# Patient Record
Sex: Female | Born: 1985
Health system: Southern US, Community
[De-identification: ages and names within clinical notes are randomized; demographics above are authoritative.]

## PROBLEM LIST (undated history)

## (undated) DIAGNOSIS — L0390 Cellulitis, unspecified: Secondary | ICD-10-CM

## (undated) DIAGNOSIS — I10 Essential (primary) hypertension: Secondary | ICD-10-CM

## (undated) DIAGNOSIS — E669 Obesity, unspecified: Secondary | ICD-10-CM

## (undated) DIAGNOSIS — E1169 Type 2 diabetes mellitus with other specified complication: Secondary | ICD-10-CM

## (undated) DIAGNOSIS — R17 Unspecified jaundice: Secondary | ICD-10-CM

## (undated) DIAGNOSIS — E785 Hyperlipidemia, unspecified: Secondary | ICD-10-CM

## (undated) HISTORY — PX: OTHER SURGICAL HISTORY: SHX169

## (undated) HISTORY — DX: Type 2 diabetes mellitus with other specified complication: E11.69

## (undated) HISTORY — DX: Cellulitis, unspecified: L03.90

## (undated) HISTORY — PX: CHOLECYSTECTOMY: SHX55

## (undated) HISTORY — DX: Type 2 diabetes mellitus with other specified complication: E78.5

---

## 2012-01-18 ENCOUNTER — Encounter (HOSPITAL_BASED_OUTPATIENT_CLINIC_OR_DEPARTMENT_OTHER): Payer: Self-pay | Admitting: Emergency Medicine

## 2012-01-18 ENCOUNTER — Emergency Department (HOSPITAL_BASED_OUTPATIENT_CLINIC_OR_DEPARTMENT_OTHER)
Admission: EM | Admit: 2012-01-18 | Discharge: 2012-01-19 | Disposition: A | Discharge status: Home / Self Care | Payer: Medicaid Other | Attending: Emergency Medicine | Admitting: Emergency Medicine

## 2012-01-18 DIAGNOSIS — O24919 Unspecified diabetes mellitus in pregnancy, unspecified trimester: Secondary | ICD-10-CM | POA: Insufficient documentation

## 2012-01-18 DIAGNOSIS — O200 Threatened abortion: Secondary | ICD-10-CM | POA: Insufficient documentation

## 2012-01-18 DIAGNOSIS — F17200 Nicotine dependence, unspecified, uncomplicated: Secondary | ICD-10-CM | POA: Insufficient documentation

## 2012-01-18 DIAGNOSIS — E119 Type 2 diabetes mellitus without complications: Secondary | ICD-10-CM | POA: Insufficient documentation

## 2012-01-18 HISTORY — DX: Obesity, unspecified: E66.9

## 2012-01-18 LAB — GLUCOSE, CAPILLARY: Glucose-Capillary: 148 mg/dL — ABNORMAL HIGH (ref 70–99)

## 2012-01-18 NOTE — ED Notes (Signed)
Pt is [redacted] weeks pregnant and started having mild vaginal bleeding tonight around 2130

## 2012-01-19 ENCOUNTER — Other Ambulatory Visit (HOSPITAL_BASED_OUTPATIENT_CLINIC_OR_DEPARTMENT_OTHER): Payer: Self-pay | Admitting: Emergency Medicine

## 2012-01-19 ENCOUNTER — Ambulatory Visit (INDEPENDENT_AMBULATORY_CARE_PROVIDER_SITE_OTHER)
Admission: RE | Admit: 2012-01-19 | Discharge: 2012-01-19 | Disposition: A | Discharge status: Home / Self Care | Payer: Medicaid Other | Source: Ambulatory Visit | Attending: Emergency Medicine | Admitting: Emergency Medicine

## 2012-01-19 ENCOUNTER — Ambulatory Visit (INDEPENDENT_AMBULATORY_CARE_PROVIDER_SITE_OTHER)
Admit: 2012-01-19 | Discharge: 2012-01-19 | Disposition: A | Discharge status: Home / Self Care | Payer: Medicaid Other | Source: Home / Self Care | Attending: Emergency Medicine | Admitting: Emergency Medicine

## 2012-01-19 DIAGNOSIS — Z3490 Encounter for supervision of normal pregnancy, unspecified, unspecified trimester: Secondary | ICD-10-CM

## 2012-01-19 DIAGNOSIS — Z331 Pregnant state, incidental: Secondary | ICD-10-CM

## 2012-01-19 DIAGNOSIS — O209 Hemorrhage in early pregnancy, unspecified: Secondary | ICD-10-CM | POA: Insufficient documentation

## 2012-01-19 DIAGNOSIS — N898 Other specified noninflammatory disorders of vagina: Secondary | ICD-10-CM

## 2012-01-19 LAB — URINALYSIS, ROUTINE W REFLEX MICROSCOPIC
Glucose, UA: NEGATIVE mg/dL
Protein, ur: NEGATIVE mg/dL
Specific Gravity, Urine: 1.023 (ref 1.005–1.030)

## 2012-01-19 LAB — URINE MICROSCOPIC-ADD ON

## 2012-01-19 LAB — WET PREP, GENITAL: Yeast Wet Prep HPF POC: NONE SEEN

## 2012-01-19 LAB — PREGNANCY, URINE: Preg Test, Ur: POSITIVE — AB

## 2012-01-19 LAB — HCG, QUANTITATIVE, PREGNANCY: hCG, Beta Chain, Quant, S: 25200 m[IU]/mL — ABNORMAL HIGH (ref <5)

## 2012-01-19 LAB — ABO/RH: ABO/RH(D): B POS

## 2012-01-19 NOTE — Discharge Instructions (Signed)
Vaginal Bleeding During Pregnancy, First Trimester  A small amount of bleeding (spotting) is relatively common in early pregnancy. It usually stops on its own. There are many causes for bleeding or spotting in early pregnancy. Some bleeding may be related to the pregnancy and some may not. Cramping with the bleeding is more serious and concerning. Tell your caregiver if you have any vaginal bleeding.   CAUSES    It is normal in most cases.   The pregnancy ends (miscarriage).   The pregnancy may end (threatened miscarriage).   Infection or inflammation of the cervix.   Growths (polyps) on the cervix.   Pregnancy happens outside of the uterus and in a fallopian tube (tubal pregnancy).   Many tiny cysts in the uterus instead of pregnancy tissue (molar pregnancy).  SYMPTOMS   Vaginal bleeding or spotting with or without cramps.  DIAGNOSIS   To evaluate the pregnancy, your caregiver may:   Do a pelvic exam.   Take blood tests.   Do an ultrasound.  It is very important to follow your caregiver's instructions.   TREATMENT    Evaluation of the pregnancy with blood tests and ultrasound.   Bed rest (getting up to use the bathroom only).   Rho-gam immunization if the mother is Rh negative and the father is Rh positive.  HOME CARE INSTRUCTIONS    If your caregiver orders bed rest, you may need to make arrangements for the care of other children and for other responsibilities. However, your caregiver may allow you to continue light activity.   Keep track of the number of pads you use each day, how often you change pads and how soaked (saturated) they are. Write this down.   Do not use tampons. Do not douche.   Do not have sexual intercourse or orgasms until approved by your physician.   Save any tissue that you pass for your caregiver to see.   Take medicine for cramps only with your caregiver's permission.   Do not take aspirin because it can make you bleed.  SEEK IMMEDIATE MEDICAL CARE IF:    You  experience severe cramps in your stomach, back or belly (abdomen).   You have an oral temperature above 102 F (38.9 C), not controlled by medicine.   You pass large clots or tissue.   Your bleeding increases or you become light-headed, weak or have fainting episodes.   You develop chills.   You are leaking or have a gush of fluid from your vagina.   You pass out while having a bowel movement. That may mean you have a ruptured tubal pregnancy.  Document Released: 06/14/2005 Document Revised: 08/24/2011 Document Reviewed: 12/24/2008  [employer] Patient Information 2012 [employer]

## 2012-01-19 NOTE — ED Provider Notes (Signed)
History     CSN: [id number]  Arrival date & time 01/18/12  2157   First MD Initiated Contact with Patient 01/19/12 0013      Chief Complaint  Patient presents with  . Vaginal Bleeding    (Consider location/radiation/quality/duration/timing/severity/associated sxs/prior treatment) HPI Is a 26 year old black female who is about [redacted] weeks pregnant with her first pregnancy. She started having mild vaginal bleeding about 9:30 PM yesterday. She has had cramping since she became pregnant but this has not acutely changed today. She characterizes the bleeding as like spotting.  Past Medical History  Diagnosis Date  . Diabetes mellitus   . Obesity     Past Surgical History  Procedure Date  . Cholecystectomy     No family history on file.  History  Substance Use Topics  . Smoking status: Current Some Day Smoker  . Smokeless tobacco: Not on file  . Alcohol Use: No    OB History    Grav Para Term Preterm Abortions TAB SAB Ect Mult Living                  Review of Systems  All other systems reviewed and are negative.    Allergies  Review of patient's allergies indicates no known allergies.  Home Medications   Current Outpatient Rx  Name Route Sig Dispense Refill  . METFORMIN HCL 500 MG PO TABS Oral Take 500 mg by mouth 2 (two) times daily with a meal.    . PRENATAL MULTIVITAMIN CH Oral Take 1 tablet by mouth daily.      BP 157/71  Pulse 83  Temp(Src) 98.5 F (36.9 C) (Oral)  Resp 18  Ht 5\' 8"  (1.727 m)  Wt 227 lb (102.967 kg)  BMI 34.52 kg/m2  SpO2 100%  LMP 10/21/2011  Physical Exam General: Well-developed, well-nourished female in no acute distress; appearance consistent with age of record HENT: normocephalic, atraumatic Eyes: pupils equal round and reactive to light; extraocular muscles intact Neck: supple Heart: regular rate and rhythm Lungs: clear to auscultation bilaterally Abdomen: soft; nondistended; nontender GU: Normal external genitalia; no  vaginal bleeding; physiologic appearing vaginal discharge; no cervical motion tenderness Extremities: No deformity; full range of motion Neurologic: Awake, alert and oriented; motor function intact in all extremities and symmetric; no facial droop Skin: Warm and dry Psychiatric: Normal mood and affect    ED Course  Procedures (including critical care time)     MDM   Nursing notes and vitals signs, including pulse oximetry, reviewed.  Summary of this visit's results, reviewed by myself:  Labs:  Results for orders placed during the hospital encounter of 01/18/12  GLUCOSE, CAPILLARY      Component Value Range   Glucose-Capillary 148 (*) 70 - 99 (mg/dL)   Comment 1 Notify RN     Comment 2 Documented in Chart    URINALYSIS, ROUTINE W REFLEX MICROSCOPIC      Component Value Range   Color, Urine YELLOW  YELLOW    APPearance CLOUDY (*) CLEAR    Specific Gravity, Urine 1.023  1.005 - 1.030    pH 6.0  5.0 - 8.0    Glucose, UA NEGATIVE  NEGATIVE (mg/dL)   Hgb urine dipstick LARGE (*) NEGATIVE    Bilirubin Urine NEGATIVE  NEGATIVE    Ketones, ur NEGATIVE  NEGATIVE (mg/dL)   Protein, ur NEGATIVE  NEGATIVE (mg/dL)   Urobilinogen, UA 1.0  0.0 - 1.0 (mg/dL)   Nitrite NEGATIVE  NEGATIVE    Leukocytes, UA  SMALL (*) NEGATIVE   PREGNANCY, URINE      Component Value Range   Preg Test, Ur POSITIVE (*) NEGATIVE   WET PREP, GENITAL      Component Value Range   Yeast Wet Prep HPF POC NONE SEEN  NONE SEEN    Trich, Wet Prep NONE SEEN  NONE SEEN    Clue Cells Wet Prep HPF POC FEW (*) NONE SEEN    WBC, Wet Prep HPF POC FEW (*) NONE SEEN   HCG, QUANTITATIVE, PREGNANCY      Component Value Range   hCG, Beta Chain, Quant, S 25200 (*) <5 (mIU/mL)  URINE MICROSCOPIC-ADD ON      Component Value Range   Squamous Epithelial / LPF FEW (*) RARE    WBC, UA 3-6  <3 (WBC/hpf)   RBC / HPF 3-6  <3 (RBC/hpf)   Bacteria, UA FEW (*) RARE    Urine-Other MUCOUS PRESENT              Carlisle Beers  Khadejah Son, MD 01/19/12 0120

## 2016-01-01 ENCOUNTER — Emergency Department (HOSPITAL_BASED_OUTPATIENT_CLINIC_OR_DEPARTMENT_OTHER)
Admission: EM | Admit: 2016-01-01 | Discharge: 2016-01-01 | Disposition: A | Discharge status: Home / Self Care | Payer: Self-pay | Attending: Emergency Medicine | Admitting: Emergency Medicine

## 2016-01-01 ENCOUNTER — Emergency Department (HOSPITAL_BASED_OUTPATIENT_CLINIC_OR_DEPARTMENT_OTHER): Payer: Self-pay

## 2016-01-01 ENCOUNTER — Encounter (HOSPITAL_BASED_OUTPATIENT_CLINIC_OR_DEPARTMENT_OTHER): Payer: Self-pay | Admitting: *Deleted

## 2016-01-01 DIAGNOSIS — Z9049 Acquired absence of other specified parts of digestive tract: Secondary | ICD-10-CM | POA: Insufficient documentation

## 2016-01-01 DIAGNOSIS — K5900 Constipation, unspecified: Secondary | ICD-10-CM | POA: Insufficient documentation

## 2016-01-01 DIAGNOSIS — Z7984 Long term (current) use of oral hypoglycemic drugs: Secondary | ICD-10-CM | POA: Insufficient documentation

## 2016-01-01 DIAGNOSIS — R52 Pain, unspecified: Secondary | ICD-10-CM

## 2016-01-01 DIAGNOSIS — Z9114 Patient's other noncompliance with medication regimen: Secondary | ICD-10-CM

## 2016-01-01 DIAGNOSIS — E669 Obesity, unspecified: Secondary | ICD-10-CM | POA: Insufficient documentation

## 2016-01-01 DIAGNOSIS — K2970 Gastritis, unspecified, without bleeding: Secondary | ICD-10-CM | POA: Insufficient documentation

## 2016-01-01 DIAGNOSIS — I10 Essential (primary) hypertension: Secondary | ICD-10-CM | POA: Insufficient documentation

## 2016-01-01 DIAGNOSIS — E119 Type 2 diabetes mellitus without complications: Secondary | ICD-10-CM | POA: Insufficient documentation

## 2016-01-01 DIAGNOSIS — F17200 Nicotine dependence, unspecified, uncomplicated: Secondary | ICD-10-CM | POA: Insufficient documentation

## 2016-01-01 HISTORY — DX: Essential (primary) hypertension: I10

## 2016-01-01 LAB — HEPATIC FUNCTION PANEL
ALT: 16 U/L (ref 14–54)
AST: 17 U/L (ref 15–41)
Albumin: 3.9 g/dL (ref 3.5–5.0)
Alkaline Phosphatase: 107 U/L (ref 38–126)
BILIRUBIN DIRECT: 0.1 mg/dL (ref 0.1–0.5)
Indirect Bilirubin: 0.4 mg/dL (ref 0.3–0.9)
TOTAL PROTEIN: 8 g/dL (ref 6.5–8.1)
Total Bilirubin: 0.5 mg/dL (ref 0.3–1.2)

## 2016-01-01 LAB — CBC WITH DIFFERENTIAL/PLATELET
BASOS ABS: 0 10*3/uL (ref 0.0–0.1)
BASOS PCT: 0 %
EOS ABS: 0.2 10*3/uL (ref 0.0–0.7)
EOS PCT: 2 %
HCT: 40.9 % (ref 36.0–46.0)
Hemoglobin: 13.5 g/dL (ref 12.0–15.0)
Lymphocytes Relative: 51 %
Lymphs Abs: 5.1 10*3/uL — ABNORMAL HIGH (ref 0.7–4.0)
MCH: 26 pg (ref 26.0–34.0)
MCHC: 33 g/dL (ref 30.0–36.0)
MCV: 78.7 fL (ref 78.0–100.0)
MONO ABS: 0.7 10*3/uL (ref 0.1–1.0)
Monocytes Relative: 7 %
Neutro Abs: 4 10*3/uL (ref 1.7–7.7)
Neutrophils Relative %: 40 %
PLATELETS: 331 10*3/uL (ref 150–400)
RBC: 5.2 MIL/uL — AB (ref 3.87–5.11)
RDW: 14.2 % (ref 11.5–15.5)
WBC: 10 10*3/uL (ref 4.0–10.5)

## 2016-01-01 LAB — RAPID URINE DRUG SCREEN, HOSP PERFORMED
Amphetamines: NOT DETECTED
BARBITURATES: NOT DETECTED
Benzodiazepines: NOT DETECTED
Cocaine: NOT DETECTED
Opiates: NOT DETECTED
TETRAHYDROCANNABINOL: NOT DETECTED

## 2016-01-01 LAB — BASIC METABOLIC PANEL
ANION GAP: 7 (ref 5–15)
BUN: 9 mg/dL (ref 6–20)
CALCIUM: 9.1 mg/dL (ref 8.9–10.3)
CO2: 25 mmol/L (ref 22–32)
Chloride: 101 mmol/L (ref 101–111)
Creatinine, Ser: 0.6 mg/dL (ref 0.44–1.00)
GLUCOSE: 320 mg/dL — AB (ref 65–99)
POTASSIUM: 3.7 mmol/L (ref 3.5–5.1)
SODIUM: 133 mmol/L — AB (ref 135–145)

## 2016-01-01 LAB — URINALYSIS, ROUTINE W REFLEX MICROSCOPIC
BILIRUBIN URINE: NEGATIVE
Glucose, UA: >1000 mg/dL — AB
Hgb urine dipstick: NEGATIVE
KETONES UR: NEGATIVE mg/dL
LEUKOCYTES UA: NEGATIVE
NITRITE: NEGATIVE
PH: 7.5 (ref 5.0–8.0)
PROTEIN: 100 mg/dL — AB
Specific Gravity, Urine: 1.03 (ref 1.005–1.030)

## 2016-01-01 LAB — ACETAMINOPHEN LEVEL

## 2016-01-01 LAB — URINE MICROSCOPIC-ADD ON

## 2016-01-01 LAB — PREGNANCY, URINE: PREG TEST UR: NEGATIVE

## 2016-01-01 LAB — SALICYLATE LEVEL: Salicylate Lvl: <4 mg/dL (ref 2.8–30.0)

## 2016-01-01 MED ORDER — OMEPRAZOLE 20 MG PO CPDR: 20.0000 mg | DELAYED_RELEASE_CAPSULE | Freq: Every day | ORAL | Status: DC

## 2016-01-01 MED ORDER — ACETAMINOPHEN 325 MG PO TABS: 650.0000 mg | ORAL_TABLET | Freq: Once | ORAL | Status: DC

## 2016-01-01 MED ORDER — ONDANSETRON HCL 4 MG/2ML IJ SOLN
4.0000 mg | Freq: Once | INTRAMUSCULAR | Status: AC
  Administered 2016-01-01: 4 mg via INTRAVENOUS
  Filled 2016-01-01: qty 2

## 2016-01-01 MED ORDER — GI COCKTAIL ~~LOC~~
30.0000 mL | Freq: Once | ORAL | Status: AC
  Administered 2016-01-01: 30 mL via ORAL
  Filled 2016-01-01: qty 30

## 2016-01-01 MED ORDER — KETOROLAC TROMETHAMINE 30 MG/ML IJ SOLN
30.0000 mg | Freq: Once | INTRAMUSCULAR | Status: AC
  Administered 2016-01-01: 30 mg via INTRAVENOUS
  Filled 2016-01-01: qty 1

## 2016-01-01 MED ORDER — SODIUM CHLORIDE 0.9 % IV BOLUS (SEPSIS)
500.0000 mL | Freq: Once | INTRAVENOUS | Status: AC
  Administered 2016-01-01: 500 mL via INTRAVENOUS

## 2016-01-01 MED ORDER — DICYCLOMINE HCL 10 MG/ML IM SOLN
20.0000 mg | Freq: Once | INTRAMUSCULAR | Status: AC
  Administered 2016-01-01: 20 mg via INTRAMUSCULAR
  Filled 2016-01-01: qty 2

## 2016-01-01 MED ORDER — HYDROCHLOROTHIAZIDE 25 MG PO TABS: 25.0000 mg | ORAL_TABLET | Freq: Every day | ORAL | Status: DC

## 2016-01-01 MED ORDER — HYDROCHLOROTHIAZIDE 25 MG PO TABS
25.0000 mg | ORAL_TABLET | Freq: Every day | ORAL | Status: DC
  Administered 2016-01-01: 25 mg via ORAL
  Filled 2016-01-01: qty 1

## 2016-01-01 MED ORDER — METFORMIN HCL 500 MG PO TABS: 500.0000 mg | ORAL_TABLET | Freq: Two times a day (BID) | ORAL | Status: DC

## 2016-01-01 NOTE — ED Notes (Signed)
C/o sudden onset abd pain w vomited x 1 and nausea

## 2016-01-01 NOTE — Discharge Instructions (Signed)

## 2016-01-01 NOTE — ED Notes (Signed)
C/o sudden set mid to rt sided lower abd pain nausea w vomited x 1

## 2016-01-01 NOTE — ED Provider Notes (Signed)
CSN: [id number]     Arrival date & time 01/01/16  0214 History   First MD Initiated Contact with Patient 01/01/16 0224     Chief Complaint  Patient presents with  . Abdominal Pain     (Consider location/radiation/quality/duration/timing/severity/associated sxs/prior Treatment) Patient is a 30 y.o. female presenting with abdominal pain. The history is provided by the patient.  Abdominal Pain Pain location:  Epigastric Pain quality: cramping   Pain radiates to:  Does not radiate Pain severity:  Moderate Onset quality:  Sudden Timing:  Constant Progression:  Unchanged Chronicity:  New Context: not trauma   Context comment:  Diarrhea then cramping then nausea and emesis Relieved by:  Nothing Worsened by:  Nothing tried Ineffective treatments:  None tried Associated symptoms: vomiting   Associated symptoms: no anorexia, no dysuria, no fever, no shortness of breath, no vaginal bleeding and no vaginal discharge   Risk factors: not pregnant     Past Medical History  Diagnosis Date  . Diabetes mellitus   . Obesity   . Hypertension    Past Surgical History  Procedure Laterality Date  . Cholecystectomy     History reviewed. No pertinent family history. Social History  Substance Use Topics  . Smoking status: Current Some Day Smoker  . Smokeless tobacco: None  . Alcohol Use: No   OB History    No data available     Review of Systems  Constitutional: Negative for fever.  Respiratory: Negative for shortness of breath.   Gastrointestinal: Positive for vomiting and abdominal pain. Negative for anorexia.  Genitourinary: Negative for dysuria, vaginal bleeding and vaginal discharge.  All other systems reviewed and are negative.     Allergies  Review of patient's allergies indicates no known allergies.  Home Medications   Prior to Admission medications   Medication Sig Start Date End Date Taking? Authorizing Provider  metFORMIN (GLUCOPHAGE) 500 MG tablet Take 500 mg by  mouth 2 (two) times daily with a meal.    Historical Provider, MD  Prenatal Vit-Fe Fumarate-FA (PRENATAL MULTIVITAMIN) TABS Take 1 tablet by mouth daily.    Historical Provider, MD   BP 192/113 mmHg  Pulse 90  Temp(Src) 98.8 F (37.1 C) (Oral)  Resp 20  Ht  (1.753 m)  Wt 302 lb (136.986 kg)  BMI 44.58 kg/m2  SpO2 100% Physical Exam  Constitutional: She is oriented to person, place, and time. She appears well-developed and well-nourished. No distress.  HENT:  Head: Normocephalic and atraumatic.  Mouth/Throat: Oropharynx is clear and moist.  Eyes: Conjunctivae are normal. Pupils are equal, round, and reactive to light.  Neck: Normal range of motion. Neck supple.  Cardiovascular: Normal rate, regular rhythm and intact distal pulses.   Pulmonary/Chest: Effort normal and breath sounds normal. No respiratory distress. She has no wheezes. She has no rales.  Abdominal: Soft. She exhibits no mass. Bowel sounds are increased. There is no tenderness. There is no rigidity, no rebound, no guarding, no tenderness at McBurney's point and negative Murphy's sign.  Musculoskeletal: Normal range of motion.  Neurological: She is alert and oriented to person, place, and time.  Skin: Skin is warm and dry.  Psychiatric: She has a normal mood and affect.    ED Course  Procedures (including critical care time) Labs Review Labs Reviewed  CBC WITH DIFFERENTIAL/PLATELET - Abnormal; Notable for the following:    RBC 5.20 (*)    Lymphs Abs 5.1 (*)    All other components within normal limits  BASIC  METABOLIC PANEL - Abnormal; Notable for the following:    Sodium 133 (*)    Glucose, Bld 320 (*)    All other components within normal limits  URINALYSIS, ROUTINE W REFLEX MICROSCOPIC (NOT [facility]) - Abnormal; Notable for the following:    APPearance CLOUDY (*)    Glucose, UA >1000 (*)    Protein, ur 100 (*)    All other components within normal limits  ACETAMINOPHEN LEVEL - Abnormal; Notable for the  following:    Acetaminophen (Tylenol), Serum <10 (*)    All other components within normal limits  URINE MICROSCOPIC-ADD ON - Abnormal; Notable for the following:    Squamous Epithelial / LPF 6-30 (*)    Bacteria, UA FEW (*)    All other components within normal limits  PREGNANCY, URINE  SALICYLATE LEVEL  URINE RAPID DRUG SCREEN, HOSP PERFORMED  HEPATIC FUNCTION PANEL    Imaging Review Ct Renal Stone Study  01/01/2016  CLINICAL DATA:  Sudden onset mid to right-sided lower abdominal pain and nausea. EXAM: CT ABDOMEN AND PELVIS WITHOUT CONTRAST TECHNIQUE: Multidetector CT imaging of the abdomen and pelvis was performed following the standard protocol without IV contrast. COMPARISON:  09/22/2014 FINDINGS: There are no urinary calculi. There is no hydronephrosis or ureteral dilatation. There are unremarkable unenhanced appearances of kidneys, ureters and urinary bladder. Urinary bladder is nearly completely empty. There are unremarkable unenhanced appearances of the liver. There is cholecystectomy. There is a biliary stent which appears unchanged from 09/22/2014. Bile ducts are unremarkable. There are unremarkable unenhanced appearances of the pancreas, spleen and adrenals. The abdominal aorta is normal in caliber. There is no atherosclerotic calcification. There is mild mesenteric adenopathy, best seen on the coronal images. This is not significantly changed from 09/22/2014. There are normal appearances of the stomach, small bowel and colon. The appendix is normal. The uterus and adnexal structures appear unremarkable. No acute inflammatory changes are evident in the abdomen or pelvis. There is no ascites. IMPRESSION: No acute findings are evident in the abdomen or pelvis. Prominent mesenteric nodes, nonspecific but unchanged from 09/22/2014. Unchanged biliary stent. Otherwise unremarkable. Electronically Signed   By: Ellery Plunkaniel R Mitchell M.D.   On: 01/01/2016 04:09   I have personally reviewed and  evaluated these images and lab results as part of my medical decision-making.   EKG Interpretation None      MDM   Final diagnoses:  Pain  Gastritis  Constipation, unspecified constipation type   Medications  ketorolac (TORADOL) 30 MG/ML injection 30 mg (not administered)  hydrochlorothiazide (HYDRODIURIL) tablet 25 mg (not administered)  ondansetron (ZOFRAN) injection 4 mg (4 mg Intravenous Given 01/01/16 0435)  dicyclomine (BENTYL) injection 20 mg (20 mg Intramuscular Given 01/01/16 0437)  gi cocktail (Maalox,Lidocaine,Donnatal) (30 mLs Oral Given 01/01/16 0441)  sodium chloride 0.9 % bolus 500 mL (500 mLs Intravenous New Bag/Given 01/01/16 0434)    Patient is not taking her metformin and has not followed up regarding her BP.  Will write RX for metformin and hctz 30 day supply.  She must follow up.  Do not take tylenol more than every 6 hours and do not take ibuprofen in excess.  Eat a GERD friendly low carb diet.  Follow up with your PMD strict return precautions   Alysandra Lobue, MD 01/01/16 [phone]

## 2017-01-12 ENCOUNTER — Emergency Department (HOSPITAL_BASED_OUTPATIENT_CLINIC_OR_DEPARTMENT_OTHER)
Admission: EM | Admit: 2017-01-12 | Discharge: 2017-01-13 | Disposition: A | Discharge status: Home / Self Care | Payer: Medicaid Other | Attending: Emergency Medicine | Admitting: Emergency Medicine

## 2017-01-12 ENCOUNTER — Encounter (HOSPITAL_BASED_OUTPATIENT_CLINIC_OR_DEPARTMENT_OTHER): Payer: Self-pay | Admitting: *Deleted

## 2017-01-12 DIAGNOSIS — S060X0A Concussion without loss of consciousness, initial encounter: Secondary | ICD-10-CM | POA: Diagnosis not present

## 2017-01-12 DIAGNOSIS — S39012A Strain of muscle, fascia and tendon of lower back, initial encounter: Secondary | ICD-10-CM | POA: Diagnosis not present

## 2017-01-12 DIAGNOSIS — Y990 Civilian activity done for income or pay: Secondary | ICD-10-CM | POA: Diagnosis not present

## 2017-01-12 DIAGNOSIS — S29001A Unspecified injury of muscle and tendon of front wall of thorax, initial encounter: Secondary | ICD-10-CM | POA: Insufficient documentation

## 2017-01-12 DIAGNOSIS — Y92511 Restaurant or cafe as the place of occurrence of the external cause: Secondary | ICD-10-CM | POA: Diagnosis not present

## 2017-01-12 DIAGNOSIS — W01198A Fall on same level from slipping, tripping and stumbling with subsequent striking against other object, initial encounter: Secondary | ICD-10-CM | POA: Diagnosis not present

## 2017-01-12 DIAGNOSIS — I10 Essential (primary) hypertension: Secondary | ICD-10-CM | POA: Diagnosis not present

## 2017-01-12 DIAGNOSIS — W19XXXA Unspecified fall, initial encounter: Secondary | ICD-10-CM

## 2017-01-12 DIAGNOSIS — F17210 Nicotine dependence, cigarettes, uncomplicated: Secondary | ICD-10-CM | POA: Insufficient documentation

## 2017-01-12 DIAGNOSIS — E119 Type 2 diabetes mellitus without complications: Secondary | ICD-10-CM | POA: Diagnosis not present

## 2017-01-12 DIAGNOSIS — R52 Pain, unspecified: Secondary | ICD-10-CM

## 2017-01-12 DIAGNOSIS — Y9389 Activity, other specified: Secondary | ICD-10-CM | POA: Diagnosis not present

## 2017-01-12 DIAGNOSIS — S298XXA Other specified injuries of thorax, initial encounter: Secondary | ICD-10-CM

## 2017-01-12 DIAGNOSIS — S0990XA Unspecified injury of head, initial encounter: Secondary | ICD-10-CM | POA: Diagnosis present

## 2017-01-12 NOTE — ED Triage Notes (Addendum)
Pt reports that she fell at work tonight and hit her head and back.  Reports head and back pain since that time.  Denies LOC.  Pt unsure if she needs to file WC or not.  Denies N/V, denies confusion.

## 2017-01-13 ENCOUNTER — Emergency Department (HOSPITAL_BASED_OUTPATIENT_CLINIC_OR_DEPARTMENT_OTHER): Payer: Medicaid Other

## 2017-01-13 MED ORDER — ACETAMINOPHEN 325 MG PO TABS
650.0000 mg | ORAL_TABLET | Freq: Once | ORAL | Status: AC
  Administered 2017-01-13: 650 mg via ORAL
  Filled 2017-01-13: qty 2

## 2017-01-13 NOTE — ED Provider Notes (Signed)
[facility]   CSN: [id number] Arrival date & time: 01/12/17  2303     History   Chief Complaint Chief Complaint  Patient presents with  . Back Pain    HPI Carolyn Zimmerman is a 31 y.o. female.  The history is provided by the patient.  Back Pain   This is a new problem. The problem occurs constantly. The pain is associated with falling. The pain is present in the thoracic spine. The pain is moderate. The symptoms are aggravated by certain positions. The pain is the same all the time. Associated symptoms include headaches. Pertinent negatives include no chest pain, no fever and no abdominal pain.  Patient presents s/p fall at work She works at [employer], and slipped on floor and fell backwards hitting her head and her back This occurred at approximately 1830 No LOC She reports laughing afterwards But now she has headache, back pain and also pain in back with breathing No anterior CP No weakness in extremities No vomiting No other complaints  Past Medical History:  Diagnosis Date  . Diabetes mellitus   . Hypertension   . Obesity     There are no active problems to display for this patient.   Past Surgical History:  Procedure Laterality Date  . CHOLECYSTECTOMY      OB History    No data available       Home Medications    Prior to Admission medications   Not on File    Family History History reviewed. No pertinent family history.  Social History Social History  Substance Use Topics  . Smoking status: Current Some Day Smoker  . Smokeless tobacco: Not on file  . Alcohol use No     Allergies   Patient has no known allergies.   Review of Systems Review of Systems  Constitutional: Negative for fever.  Respiratory: Negative for cough.   Cardiovascular: Negative for chest pain.  Gastrointestinal: Negative for abdominal pain.  Musculoskeletal: Positive for back pain. Negative for neck pain.  Neurological: Positive for headaches.    All other systems reviewed and are negative.    Physical Exam Updated Vital Signs BP (!) 153/92 (BP Location: Left Wrist)   Pulse 80   Temp 98.6 F (37 C) (Oral)   Resp 16   Ht  (1.727 m)   Wt 127 kg   LMP 12/12/2016   SpO2 100%   BMI 42.57 kg/m   Physical Exam CONSTITUTIONAL: Well developed/well nourished HEAD: Normocephalic/atraumatic EYES: EOMI/PERRL ENMT: Mucous membranes moist NECK: supple no meningeal signs SPINE/BACK:no cervical or lumbar tenderness Thoracic tenderness noted.  No bruising/crepitance/stepoffs noted to spine CV: S1/S2 noted, no murmurs/rubs/gallops noted LUNGS: Lungs are clear to auscultation bilaterally, no apparent distress ABDOMEN: soft, nontender, no rebound or guarding, bowel sounds noted throughout abdomen, obese GU:no cva tenderness NEURO: Pt is awake/alert/appropriate, moves all extremitiesx4.  No facial droop.  GCS 15 EXTREMITIES: pulses normal/equal, full ROM SKIN: warm, color normal PSYCH: no abnormalities of mood noted, alert and oriented to situation   ED Treatments / Results  Labs (all labs ordered are listed, but only abnormal results are displayed) Labs Reviewed - No data to display  EKG  EKG Interpretation None       Radiology Dg Chest 2 View  Result Date: 01/13/2017 CLINICAL DATA:  Mid back pain and chest pain after fall. EXAM: CHEST  2 VIEW COMPARISON:  06/30/2012 CXR FINDINGS: The heart size and mediastinal contours are within normal limits. Both  lungs are clear. Nipple bars are seen bilaterally. The visualized skeletal structures are unremarkable. IMPRESSION: No active cardiopulmonary disease. Electronically Signed   By: Tollie Eth M.D.   On: 01/13/2017 02:04   Dg Thoracic Spine W/swimmers  Result Date: 01/13/2017 CLINICAL DATA:  Mid back pain after fall today EXAM: THORACIC SPINE - 3 VIEWS COMPARISON:  None. FINDINGS: There is no evidence of acute thoracic spine fracture nor compression. The visualized ribs  appear grossly intact. Alignment is normal. No other significant bone abnormalities are identified. Cholecystectomy clips are seen in the right upper quadrant. IMPRESSION: No acute fracture identified of the thoracic spine. Electronically Signed   By: Tollie Eth M.D.   On: 01/13/2017 02:03    Procedures Procedures (including critical care time)  Medications Ordered in ED Medications  acetaminophen (TYLENOL) tablet 650 mg (650 mg Oral Given 01/13/17 0100)     Initial Impression / Assessment and Plan / ED Course  I have reviewed the triage vital signs and the nursing notes.  Pertinent  imaging results that were available during my care of the patient were reviewed by me and considered in my medical decision making (see chart for details).     Pt s/p fall at work No LOC, no vomiting, GCS 15 - defer CT head, probable concussion She had midline back pain, imaging of thoracic spine and CXR negative for PTX Will d/c home Advised need for f/u for her blood pressure as this will likely need medication management   Final Clinical Impressions(s) / ED Diagnoses   Final diagnoses:  Pain  Fall, initial encounter  Concussion without loss of consciousness, initial encounter  Blunt trauma to chest, initial encounter  Back strain, initial encounter    New Prescriptions There are no discharge medications for this patient.    Zadie Rhine, MD 01/13/17 [phone]

## 2017-01-13 NOTE — ED Notes (Signed)
Alert, NAD, calm, interactive, resps e/u, speaking in clear complete sentences, no dyspnea noted, skin W&D, c/o upper back pain and A/P head pain, onset after slipping at work on a laminate floor, rates pain 10/10, mentions some milder elbow and L knee pain. Ambulatory with steady gait. Also mentions some dizziness and back hurts with inspriation (denies: sob, LOC, nv nausea, dizziness or visual changes).

## 2017-06-14 ENCOUNTER — Inpatient Hospital Stay (HOSPITAL_BASED_OUTPATIENT_CLINIC_OR_DEPARTMENT_OTHER)
Admission: EM | Admit: 2017-06-14 | Discharge: 2017-06-18 | DRG: 603 | Disposition: A | Discharge status: Home / Self Care | Payer: Medicaid Other | Attending: Internal Medicine | Admitting: Internal Medicine

## 2017-06-14 ENCOUNTER — Encounter (HOSPITAL_BASED_OUTPATIENT_CLINIC_OR_DEPARTMENT_OTHER): Payer: Self-pay | Admitting: *Deleted

## 2017-06-14 DIAGNOSIS — Z6841 Body Mass Index (BMI) 40.0 and over, adult: Secondary | ICD-10-CM

## 2017-06-14 DIAGNOSIS — I10 Essential (primary) hypertension: Secondary | ICD-10-CM | POA: Diagnosis present

## 2017-06-14 DIAGNOSIS — R7989 Other specified abnormal findings of blood chemistry: Secondary | ICD-10-CM

## 2017-06-14 DIAGNOSIS — Z91148 Patient's other noncompliance with medication regimen for other reason: Secondary | ICD-10-CM

## 2017-06-14 DIAGNOSIS — Z9114 Patient's other noncompliance with medication regimen: Secondary | ICD-10-CM

## 2017-06-14 DIAGNOSIS — E1165 Type 2 diabetes mellitus with hyperglycemia: Secondary | ICD-10-CM | POA: Diagnosis present

## 2017-06-14 DIAGNOSIS — K760 Fatty (change of) liver, not elsewhere classified: Secondary | ICD-10-CM | POA: Diagnosis present

## 2017-06-14 DIAGNOSIS — Z9049 Acquired absence of other specified parts of digestive tract: Secondary | ICD-10-CM

## 2017-06-14 DIAGNOSIS — F17200 Nicotine dependence, unspecified, uncomplicated: Secondary | ICD-10-CM | POA: Diagnosis present

## 2017-06-14 DIAGNOSIS — R739 Hyperglycemia, unspecified: Secondary | ICD-10-CM

## 2017-06-14 DIAGNOSIS — I160 Hypertensive urgency: Secondary | ICD-10-CM | POA: Diagnosis present

## 2017-06-14 DIAGNOSIS — Z794 Long term (current) use of insulin: Secondary | ICD-10-CM

## 2017-06-14 DIAGNOSIS — Z23 Encounter for immunization: Secondary | ICD-10-CM

## 2017-06-14 DIAGNOSIS — L0291 Cutaneous abscess, unspecified: Secondary | ICD-10-CM

## 2017-06-14 DIAGNOSIS — G43909 Migraine, unspecified, not intractable, without status migrainosus: Secondary | ICD-10-CM | POA: Diagnosis present

## 2017-06-14 DIAGNOSIS — L02213 Cutaneous abscess of chest wall: Principal | ICD-10-CM | POA: Diagnosis present

## 2017-06-14 DIAGNOSIS — R945 Abnormal results of liver function studies: Secondary | ICD-10-CM

## 2017-06-14 LAB — CBC WITH DIFFERENTIAL/PLATELET
Basophils Absolute: 0 10*3/uL (ref 0.0–0.1)
Basophils Relative: 0 %
EOS PCT: 2 %
Eosinophils Absolute: 0.2 10*3/uL (ref 0.0–0.7)
HCT: 34 % — ABNORMAL LOW (ref 36.0–46.0)
Hemoglobin: 11 g/dL — ABNORMAL LOW (ref 12.0–15.0)
LYMPHS ABS: 3.3 10*3/uL (ref 0.7–4.0)
LYMPHS PCT: 25 %
MCH: 26.6 pg (ref 26.0–34.0)
MCHC: 32.4 g/dL (ref 30.0–36.0)
MCV: 82.3 fL (ref 78.0–100.0)
MONO ABS: 1.4 10*3/uL — AB (ref 0.1–1.0)
MONOS PCT: 10 %
Neutro Abs: 8.4 10*3/uL — ABNORMAL HIGH (ref 1.7–7.7)
Neutrophils Relative %: 63 %
PLATELETS: 313 10*3/uL (ref 150–400)
RBC: 4.13 MIL/uL (ref 3.87–5.11)
RDW: 14.2 % (ref 11.5–15.5)
WBC: 13.3 10*3/uL — ABNORMAL HIGH (ref 4.0–10.5)

## 2017-06-14 LAB — BASIC METABOLIC PANEL
Anion gap: 7 (ref 5–15)
BUN: 8 mg/dL (ref 6–20)
CHLORIDE: 101 mmol/L (ref 101–111)
CO2: 24 mmol/L (ref 22–32)
CREATININE: 0.58 mg/dL (ref 0.44–1.00)
Calcium: 8.6 mg/dL — ABNORMAL LOW (ref 8.9–10.3)
GFR calc Af Amer: >60 mL/min (ref >60)
GFR calc non Af Amer: >60 mL/min (ref >60)
GLUCOSE: 367 mg/dL — AB (ref 65–99)
POTASSIUM: 3.4 mmol/L — AB (ref 3.5–5.1)
Sodium: 132 mmol/L — ABNORMAL LOW (ref 135–145)

## 2017-06-14 LAB — PREGNANCY, URINE: PREG TEST UR: NEGATIVE

## 2017-06-14 MED ORDER — LIDOCAINE-EPINEPHRINE (PF) 2 %-1:200000 IJ SOLN
10.0000 mL | Freq: Once | INTRAMUSCULAR | Status: AC
  Administered 2017-06-14: 10 mL
  Filled 2017-06-14: qty 10

## 2017-06-14 MED ORDER — VANCOMYCIN HCL IN DEXTROSE 1-5 GM/200ML-% IV SOLN
1000.0000 mg | Freq: Once | INTRAVENOUS | Status: AC
  Administered 2017-06-15: 1000 mg via INTRAVENOUS
  Filled 2017-06-14: qty 200

## 2017-06-14 MED ORDER — HYDROMORPHONE HCL 1 MG/ML IJ SOLN
0.5000 mg | Freq: Once | INTRAMUSCULAR | Status: AC
  Administered 2017-06-14: 0.5 mg via INTRAVENOUS
  Filled 2017-06-14: qty 1

## 2017-06-14 MED ORDER — SODIUM CHLORIDE 0.9 % IV BOLUS (SEPSIS)
500.0000 mL | Freq: Once | INTRAVENOUS | Status: AC
Start: 2017-06-14 — End: 2017-06-15
  Administered 2017-06-14: 500 mL via INTRAVENOUS

## 2017-06-14 MED ORDER — VANCOMYCIN HCL IN DEXTROSE 1-5 GM/200ML-% IV SOLN
1000.0000 mg | Freq: Once | INTRAVENOUS | Status: AC
  Administered 2017-06-14: 1000 mg via INTRAVENOUS
  Filled 2017-06-14: qty 200

## 2017-06-14 NOTE — ED Provider Notes (Signed)
[facility] Provider Note   CSN: [id number] Arrival date & time: 06/14/17  2213     History   Chief Complaint Chief Complaint  Patient presents with  . Abscess    HPI Carolyn Zimmerman is a 31 y.o. female.  HPI Patient has a history of hypertension and diabetes. She's been off her medicines for a year. Over the last 5 days she's had swelling on her anterior chest between her breasts. She's had abscesses in this area before. Has had chills without frank fever. Has had some nausea. Has been urinating frequently over the last 5 days also. His had to get up numerous times at night. Does not have a primary care doctor Past Medical History:  Diagnosis Date  . Diabetes mellitus   . Hypertension   . Obesity     There are no active problems to display for this patient.   Past Surgical History:  Procedure Laterality Date  . CHOLECYSTECTOMY      OB History    No data available       Home Medications    Prior to Admission medications   Not on File    Family History History reviewed. No pertinent family history.  Social History Social History  Substance Use Topics  . Smoking status: Current Some Day Smoker  . Smokeless tobacco: Never Used  . Alcohol use No     Allergies   Patient has no known allergies.   Review of Systems Review of Systems  Constitutional: Negative for appetite change.  HENT: Negative for congestion.   Respiratory: Negative for shortness of breath.   Cardiovascular: Negative for chest pain.  Gastrointestinal: Negative for abdominal pain.  Endocrine: Positive for polyuria.  Genitourinary: Negative for flank pain.  Musculoskeletal: Negative for back pain.  Neurological: Negative for numbness.  Hematological: Negative for adenopathy.  Psychiatric/Behavioral: Negative for confusion.     Physical Exam Updated Vital Signs BP (!) 235/134 (BP Location: Left Arm) Comment: Doesnt take bp medication,.   Pulse (!) 101   Temp 99.1 F  (37.3 C) (Oral)   Resp (!) 24   Ht 5' 7.5" (1.715 m)   Wt 127 kg (280 lb)   LMP 05/30/2017   SpO2 100%   BMI 43.21 kg/m   Physical Exam  Constitutional: She appears well-developed.  HENT:  Head: Atraumatic.  Eyes: Pupils are equal, round, and reactive to light.  Neck: Neck supple.  Cardiovascular: Normal rate.   Pulmonary/Chest: Effort normal. She exhibits tenderness.  Around 13 cm abscess between her breasts. The center area is fluctuant and has scars from previous incision and drainage. Laterally is more firm. Some erythema.  Abdominal: Soft.  Musculoskeletal: She exhibits no edema.  Neurological: She is alert.  Skin: Skin is warm. Capillary refill takes less than 2 seconds.     ED Treatments / Results  Labs (all labs ordered are listed, but only abnormal results are displayed) Labs Reviewed  BASIC METABOLIC PANEL - Abnormal; Notable for the following:       Result Value   Sodium 132 (*)    Potassium 3.4 (*)    Glucose, Bld 367 (*)    Calcium 8.6 (*)    All other components within normal limits  CBC WITH DIFFERENTIAL/PLATELET - Abnormal; Notable for the following:    WBC 13.3 (*)    Hemoglobin 11.0 (*)    HCT 34.0 (*)    Neutro Abs 8.4 (*)    Monocytes Absolute 1.4 (*)  All other components within normal limits  AEROBIC CULTURE (SUPERFICIAL SPECIMEN)    EKG  EKG Interpretation None       Radiology No results found.  Procedures .Marland KitchenIncision and Drainage Date/Time: 06/14/2017 11:44 PM Performed by: Benjiman Core Authorized by: Benjiman Core   Consent:    Consent obtained:  Verbal   Consent given by:  Patient   Risks discussed:  Bleeding, incomplete drainage, pain and infection   Alternatives discussed:  No treatment Location:    Type:  Abscess   Size:  13 cm   Location:  Trunk   Trunk location:  Chest Pre-procedure details:    Skin preparation:  Chloraprep Anesthesia (see MAR for exact dosages):    Anesthesia method:  Local  infiltration   Local anesthetic:  Lidocaine 2% WITH epi Procedure type:    Complexity:  Complex Procedure details:    Needle aspiration: no     Incision types:  Single straight   Incision depth:  Submucosal   Scalpel blade:  11   Wound management:  Irrigated with saline and extensive cleaning   Drainage:  Purulent   Drainage amount:  Copious   Wound treatment:  Drain placed   Packing materials:  1/2 in iodoform gauze   Amount 1/2" iodoform:  6 inches Post-procedure details:    Patient tolerance of procedure:  Tolerated well, no immediate complications Comments:     Approximately 100 mL of purulent drainage expressed.   (including critical care time)  Medications Ordered in ED Medications  sodium chloride 0.9 % bolus 500 mL (500 mLs Intravenous New Bag/Given 06/14/17 2303)  vancomycin (VANCOCIN) IVPB 1000 mg/200 mL premix (not administered)  vancomycin (VANCOCIN) IVPB 1000 mg/200 mL premix (not administered)  lidocaine-EPINEPHrine (XYLOCAINE W/EPI) 2 %-1:200000 (PF) injection 10 mL (10 mLs Infiltration Given 06/14/17 2300)  HYDROmorphone (DILAUDID) injection 0.5 mg (0.5 mg Intravenous Given 06/14/17 2302)     Initial Impression / Assessment and Plan / ED Course  I have reviewed the triage vital signs and the nursing notes.  Pertinent labs & imaging results that were available during my care of the patient were reviewed by me and considered in my medical decision making (see chart for details).     Patient with abscess between her breasts and on her chest wall. Large wound will require antibiotics. Packing is been placed after incision and drainage. However she has also been noncompliant with her medications. Hyperglycemia without DKA. Also hypertension. Will admit to hospitalist. [facility] requested but has no beds. Will go to [facility].  Final Clinical Impressions(s) / ED Diagnoses   Final diagnoses:  Abscess  Hyperglycemia  Hx of medication noncompliance      New Prescriptions New Prescriptions   No medications on file     Benjiman Core, MD 06/14/17 2350

## 2017-06-14 NOTE — ED Notes (Signed)
I&D of abscess on chest done by Dr. Rubin Payor. Large amount of purulent puss drained. Wound cleansed and dressed.

## 2017-06-14 NOTE — ED Triage Notes (Signed)
Pt with abscess on chest x 5 days denies fevers

## 2017-06-15 DIAGNOSIS — R739 Hyperglycemia, unspecified: Secondary | ICD-10-CM

## 2017-06-15 DIAGNOSIS — G43909 Migraine, unspecified, not intractable, without status migrainosus: Secondary | ICD-10-CM | POA: Diagnosis present

## 2017-06-15 DIAGNOSIS — L02213 Cutaneous abscess of chest wall: Secondary | ICD-10-CM | POA: Diagnosis not present

## 2017-06-15 DIAGNOSIS — Z9114 Patient's other noncompliance with medication regimen: Secondary | ICD-10-CM | POA: Diagnosis not present

## 2017-06-15 DIAGNOSIS — L0291 Cutaneous abscess, unspecified: Secondary | ICD-10-CM

## 2017-06-15 DIAGNOSIS — K760 Fatty (change of) liver, not elsewhere classified: Secondary | ICD-10-CM | POA: Diagnosis present

## 2017-06-15 DIAGNOSIS — E1165 Type 2 diabetes mellitus with hyperglycemia: Secondary | ICD-10-CM | POA: Diagnosis present

## 2017-06-15 DIAGNOSIS — Z9049 Acquired absence of other specified parts of digestive tract: Secondary | ICD-10-CM | POA: Diagnosis not present

## 2017-06-15 DIAGNOSIS — R222 Localized swelling, mass and lump, trunk: Secondary | ICD-10-CM | POA: Diagnosis not present

## 2017-06-15 DIAGNOSIS — F17200 Nicotine dependence, unspecified, uncomplicated: Secondary | ICD-10-CM | POA: Diagnosis present

## 2017-06-15 DIAGNOSIS — I10 Essential (primary) hypertension: Secondary | ICD-10-CM | POA: Diagnosis present

## 2017-06-15 DIAGNOSIS — Z23 Encounter for immunization: Secondary | ICD-10-CM | POA: Diagnosis not present

## 2017-06-15 DIAGNOSIS — Z6841 Body Mass Index (BMI) 40.0 and over, adult: Secondary | ICD-10-CM | POA: Diagnosis not present

## 2017-06-15 DIAGNOSIS — I160 Hypertensive urgency: Secondary | ICD-10-CM | POA: Diagnosis present

## 2017-06-15 HISTORY — DX: Cutaneous abscess of chest wall: L02.213

## 2017-06-15 LAB — MAGNESIUM: Magnesium: 1.9 mg/dL (ref 1.7–2.4)

## 2017-06-15 LAB — URINALYSIS, ROUTINE W REFLEX MICROSCOPIC
BILIRUBIN URINE: NEGATIVE
Glucose, UA: >=500 mg/dL — AB
HGB URINE DIPSTICK: NEGATIVE
Ketones, ur: NEGATIVE mg/dL
Leukocytes, UA: NEGATIVE
NITRITE: NEGATIVE
PROTEIN: NEGATIVE mg/dL
Specific Gravity, Urine: <1.005 — ABNORMAL LOW (ref 1.005–1.030)
pH: 7.5 (ref 5.0–8.0)

## 2017-06-15 LAB — URINALYSIS, MICROSCOPIC (REFLEX)
RBC / HPF: NONE SEEN RBC/hpf (ref 0–5)
WBC UA: NONE SEEN WBC/hpf (ref 0–5)

## 2017-06-15 LAB — HEMOGLOBIN A1C
Hgb A1c MFr Bld: 10.4 % — ABNORMAL HIGH (ref 4.8–5.6)
MEAN PLASMA GLUCOSE: 251.78 mg/dL

## 2017-06-15 LAB — GLUCOSE, CAPILLARY
Glucose-Capillary: 272 mg/dL — ABNORMAL HIGH (ref 65–99)
Glucose-Capillary: 284 mg/dL — ABNORMAL HIGH (ref 65–99)

## 2017-06-15 LAB — CBG MONITORING, ED: GLUCOSE-CAPILLARY: 264 mg/dL — AB (ref 65–99)

## 2017-06-15 MED ORDER — INSULIN ASPART 100 UNIT/ML ~~LOC~~ SOLN
0.0000 [IU] | Freq: Three times a day (TID) | SUBCUTANEOUS | Status: DC
  Administered 2017-06-15: 5 [IU] via SUBCUTANEOUS

## 2017-06-15 MED ORDER — PNEUMOCOCCAL VAC POLYVALENT 25 MCG/0.5ML IJ INJ
0.5000 mL | INJECTION | INTRAMUSCULAR | Status: AC
  Administered 2017-06-17: 0.5 mL via INTRAMUSCULAR
  Filled 2017-06-15: qty 0.5

## 2017-06-15 MED ORDER — INSULIN STARTER KIT- SYRINGES (ENGLISH)
1.0000 | Freq: Once | Status: AC
  Administered 2017-06-15: 1
  Filled 2017-06-15: qty 1

## 2017-06-15 MED ORDER — ACETAMINOPHEN 325 MG PO TABS
650.0000 mg | ORAL_TABLET | Freq: Four times a day (QID) | ORAL | Status: DC | PRN
  Administered 2017-06-15 – 2017-06-16 (×3): 650 mg via ORAL
  Filled 2017-06-15 (×3): qty 2

## 2017-06-15 MED ORDER — PIPERACILLIN-TAZOBACTAM 3.375 G IVPB
3.3750 g | Freq: Three times a day (TID) | INTRAVENOUS | Status: DC
  Administered 2017-06-15 – 2017-06-17 (×6): 3.375 g via INTRAVENOUS
  Filled 2017-06-15 (×10): qty 50

## 2017-06-15 MED ORDER — HYDRALAZINE HCL 20 MG/ML IJ SOLN
10.0000 mg | Freq: Four times a day (QID) | INTRAMUSCULAR | Status: DC | PRN
  Administered 2017-06-15 – 2017-06-17 (×3): 10 mg via INTRAVENOUS
  Filled 2017-06-15 (×3): qty 1

## 2017-06-15 MED ORDER — ENOXAPARIN SODIUM 40 MG/0.4ML ~~LOC~~ SOLN
40.0000 mg | SUBCUTANEOUS | Status: DC
  Administered 2017-06-15 – 2017-06-17 (×3): 40 mg via SUBCUTANEOUS
  Filled 2017-06-15 (×3): qty 0.4

## 2017-06-15 MED ORDER — VANCOMYCIN HCL IN DEXTROSE 1-5 GM/200ML-% IV SOLN
1000.0000 mg | Freq: Three times a day (TID) | INTRAVENOUS | Status: DC
  Administered 2017-06-15 – 2017-06-17 (×7): 1000 mg via INTRAVENOUS
  Filled 2017-06-15 (×6): qty 200

## 2017-06-15 MED ORDER — INSULIN GLARGINE 100 UNIT/ML ~~LOC~~ SOLN
15.0000 [IU] | Freq: Every day | SUBCUTANEOUS | Status: DC
  Administered 2017-06-15: 15 [IU] via SUBCUTANEOUS
  Filled 2017-06-15 (×2): qty 0.15

## 2017-06-15 MED ORDER — LIVING WELL WITH DIABETES BOOK
Freq: Once | Status: AC
  Administered 2017-06-15: 21:00:00
  Filled 2017-06-15: qty 1

## 2017-06-15 MED ORDER — INFLUENZA VAC SPLIT QUAD 0.5 ML IM SUSY
0.5000 mL | PREFILLED_SYRINGE | INTRAMUSCULAR | Status: AC
  Administered 2017-06-16: 0.5 mL via INTRAMUSCULAR

## 2017-06-15 MED ORDER — POTASSIUM CHLORIDE IN NACL 40-0.9 MEQ/L-% IV SOLN
INTRAVENOUS | Status: DC
  Administered 2017-06-15: 75 mL/h via INTRAVENOUS
  Filled 2017-06-15 (×2): qty 1000

## 2017-06-15 MED ORDER — ONDANSETRON HCL 4 MG/2ML IJ SOLN
4.0000 mg | Freq: Four times a day (QID) | INTRAMUSCULAR | Status: DC | PRN
  Administered 2017-06-16: 4 mg via INTRAVENOUS
  Filled 2017-06-15: qty 2

## 2017-06-15 MED ORDER — ONDANSETRON HCL 4 MG PO TABS: 4.0000 mg | ORAL_TABLET | Freq: Four times a day (QID) | ORAL | Status: DC | PRN

## 2017-06-15 MED ORDER — ACETAMINOPHEN 650 MG RE SUPP: 650.0000 mg | Freq: Four times a day (QID) | RECTAL | Status: DC | PRN

## 2017-06-15 NOTE — H&P (Signed)
[facility] History and Physical  Carolyn Zimmerman YNW:[id number] DOB: Aug 10, 1986 DOA: 06/14/2017  Referring physician:  PCP: Patient, No Pcp Per   Chief Complaint:    HPI:   31 year old female with a history of diabetes, noncompliant with metformin, morbid obesity, hypertension who presents to the ED today because of erythema and swelling and redness on her anterior chest wall between her breasts. Patient denies any fever at home apparently had a low-grade fever admits at [facility]. CBC was greater than 300 no evidence of DKA. Patient is noncompliant with metformin. Patient underwent incision and drainage, wound was packed. Patient admitted for treatment with IV antibiotics. BP (!) 235/134 (BP Location: Left Arm) Comment: Doesnt take bp medication,.   Pulse (!) 101   Temp 99.1 F (37.3 C) (Oral)   Resp (!) 24   Ht 5' 7.5" (1.715 m)   Wt 127 kg (280 lb)   LMP 05/30/2017   SpO2 100%   BMI 43.21 kg/m  White blood cell count 13.3, potassium 3.4, sodium 132, Patient was administered IV fluids, IV vancomycin in the ED, admitted for abscess of the chest wall, uncontrolled diabetes    Review of Systems: negative for the following   A complete 12 point review of systems was done with pertinent positives in history of present illness    Past Medical History:  Diagnosis Date  . Diabetes mellitus   . Hypertension   . Obesity      Past Surgical History:  Procedure Laterality Date  . CHOLECYSTECTOMY        Social History:  reports that she has been smoking.  She has never used smokeless tobacco. She reports that she does not drink alcohol or use drugs.    No Known Allergies      FAMILY HISTORY  When questioned  Directly-patient reports  No family history of HTN, CVA ,DIABETES, TB, Cancer CAD, Bleeding Disorders, Sickle Cell, diabetes, anemia, asthma,   Prior to Admission medications   Not on File     Physical Exam: Vitals:   06/14/17 2223 06/15/17 0818 06/15/17  1107 06/15/17 1256  BP: (!) 235/134 (!) 161/109 (!) 166/95 (!) 185/103  Pulse: (!) 101 76 74 91  Resp: (!) 24 (!) Temp: 99.1 F (37.3 C) 98.3 F (36.8 C) 98.4 F (36.9 C) 98.5 F (36.9 C)  TempSrc: Oral Oral Oral Oral  SpO2: 100% 100% 100% 98%  Weight:      Height:            Vitals:   06/14/17 2223 06/15/17 0818 06/15/17 1107 06/15/17 1256  BP: (!) 235/134 (!) 161/109 (!) 166/95 (!) 185/103  Pulse: (!) 101 76 74 91  Resp: (!) 24 (!) Temp: 99.1 F (37.3 C) 98.3 F (36.8 C) 98.4 F (36.9 C) 98.5 F (36.9 C)  TempSrc: Oral Oral Oral Oral  SpO2: 100% 100% 100% 98%  Weight:      Height:       Constitutional: NAD, calm, comfortable Eyes: PERRL, lids and conjunctivae normal ENMT: Mucous membranes are moist. Posterior pharynx clear of any exudate or lesions.Normal dentition.  Neck: normal, supple, no masses, no thyromegaly Respiratory:  Around 13 cm abscess between her breasts. The center area is fluctuant and has scars from previous incision and drainage. Laterally is more firm. Some erythema.  Cardiovascular: Regular rate and rhythm, no murmurs / rubs / gallops. No extremity edema. 2+ pedal pulses. No carotid bruits.  Abdomen: no tenderness,  no masses palpated. No hepatosplenomegaly. Bowel sounds positive.  Musculoskeletal: no clubbing / cyanosis. No joint deformity upper and lower extremities. Good ROM, no contractures. Normal muscle tone.  Skin: no rashes, lesions, ulcers. No induration Neurologic: CN 2-12 grossly intact. Sensation intact, DTR normal. Strength 5/5 in all 4.  Psychiatric: Normal judgment and insight. Alert and oriented x 3. Normal mood.     Labs on Admission: I have personally reviewed following labs and imaging studies  CBC:  Recent Labs Lab 06/14/17 2259  WBC 13.3*  NEUTROABS 8.4*  HGB 11.0*  HCT 34.0*  MCV 82.3  PLT 313    Basic Metabolic Panel:  Recent Labs Lab 06/14/17 2259  NA 132*  K 3.4*  CL 101  CO2 24   GLUCOSE 367*  BUN 8  CREATININE 0.58  CALCIUM 8.6*    GFR: Estimated Creatinine Clearance: 145 mL/min (by C-G formula based on SCr of 0.58 mg/dL).  Liver Function Tests: No results for input(s): AST, ALT, ALKPHOS, BILITOT, PROT, ALBUMIN in the last 168 hours. No results for input(s): LIPASE, AMYLASE in the last 168 hours. No results for input(s): AMMONIA in the last 168 hours.  Coagulation Profile: No results for input(s): INR, PROTIME in the last 168 hours. No results for input(s): DDIMER in the last 72 hours.  Cardiac Enzymes: No results for input(s): CKTOTAL, CKMB, CKMBINDEX, TROPONINI in the last 168 hours.  BNP (last 3 results) No results for input(s): PROBNP in the last 8760 hours.  HbA1C: No results for input(s): HGBA1C in the last 72 hours. No results found for: HGBA1C   CBG:  Recent Labs Lab 06/15/17 0825  GLUCAP 264*    Lipid Profile: No results for input(s): CHOL, HDL, LDLCALC, TRIG, CHOLHDL, LDLDIRECT in the last 72 hours.  Thyroid Function Tests: No results for input(s): TSH, T4TOTAL, FREET4, T3FREE, THYROIDAB in the last 72 hours.  Anemia Panel: No results for input(s): VITAMINB12, FOLATE, FERRITIN, TIBC, IRON, RETICCTPCT in the last 72 hours.  Urine analysis:    Component Value Date/Time   COLORURINE YELLOW 06/14/2017 2350   APPEARANCEUR CLEAR 06/14/2017 2350   LABSPEC <1.005 (L) 06/14/2017 2350   PHURINE 7.5 06/14/2017 2350   GLUCOSEU >=500 (A) 06/14/2017 2350   HGBUR NEGATIVE 06/14/2017 2350   BILIRUBINUR NEGATIVE 06/14/2017 2350   KETONESUR NEGATIVE 06/14/2017 2350   PROTEINUR NEGATIVE 06/14/2017 2350   UROBILINOGEN 1.0 01/19/2012 0015   NITRITE NEGATIVE 06/14/2017 2350   LEUKOCYTESUR NEGATIVE 06/14/2017 2350    Sepsis Labs: (procalcitonin:4,lacticidven:4) ) Recent Results (from the past 240 hour(s))  Wound or Superficial Culture     Status: None (Preliminary result)   Collection Time: 06/14/17 11:00 PM  Result Value  Ref Range Status   Specimen Description CHEST  Final   Special Requests NONE  Final   Gram Stain   Final    FEW WBC PRESENT, PREDOMINANTLY PMN ABUNDANT GRAM NEGATIVE COCCOBACILLI ABUNDANT GRAM POSITIVE COCCI IN PAIRS Performed at [facility], [address] [place], [place] [address]    Culture PENDING  Incomplete   Report Status PENDING  Incomplete         Radiological Exams on Admission: No results found. No results found.    EKG: Independently reviewed.   Assessment/Plan Principal Problem:   Chest wall abscess Status post incision and drainage  In  the ED Will continue with broad-spectrum antibiotics Obtain blood culture 2 Follow wound culture Narrow antibiotics accordingly  Hypertensive urgency Patient noncompliant with outpatient medical regimen Will start  prn hydralazine  Uncontrolled diabetes  Check hemoglobin A1c Patient started on Lantus and sliding scale insulin   DVT prophylaxis:  Lovenox    CODE STATUS full      consults called: None  Family Communication: Admission, patients condition and plan of care including tests being ordered have been discussed with the patient  who indicates understanding and agree with the plan and Code Status  Admission status: inpatient   Disposition plan: Further plan will depend as patient's clinical course evolves and further radiologic and laboratory data become available. Likely home when stable   At the time of admission, it appears that the appropriate admission status for this patient is INPATIENT .Thisis judged to be reasonable and necessary in order to provide the required intensity of service to ensure the patient's safetygiven thepresenting symptoms, physical exam findings, and initial radiographic and laboratory data in the context of their chronic comorbidities.   Richarda Overlie MD [facility] Pager [phone]  If 7PM-7AM, please contact night-coverage [email or web address] Password  [email or web address]  06/15/2017, 1:03 PM

## 2017-06-15 NOTE — ED Notes (Signed)
IV to left hand d/c'ed due to pain, no s/s of infiltration noted to site.

## 2017-06-15 NOTE — Progress Notes (Signed)
Pharmacy Antibiotic Note  Carolyn Zimmerman is a 31 y.o. female admitted on 06/14/2017 with chest wall abcess/cellulitis.  Pharmacy has been consulted for Vancomycin  Dosing.  Vancomycin 2 g IV given in ED at midnight   Plan: Vancomycin 1 g IV q8h  Height: 5' 7.5" (171.5 cm) Weight: 280 lb (127 kg) IBW/kg (Calculated) : 62.75  Temp (24hrs), Avg:99.1 F (37.3 C), Min:99.1 F (37.3 C), Max:99.1 F (37.3 C)   Recent Labs Lab 06/14/17 2259  WBC 13.3*  CREATININE 0.58    Estimated Creatinine Clearance: 145 mL/min (by C-G formula based on SCr of 0.58 mg/dL).    No Known Allergies   Carolyn Zimmerman 06/15/2017 5:57 AM

## 2017-06-15 NOTE — Progress Notes (Signed)
Pharmacy Antibiotic Note  Carolyn Zimmerman is a 31 y.o. female admitted on 06/14/2017 with chest wall abcess/cellulitis.  Pharmacy has been consulted for Vancomycin  Dosing. Zosyn added later in AM  Plan:  Continue Vancomycin 1 g IV q8h  Zosyn 3.375 g IV given once over 30 minutes, then every 8 hrs by 4-hr infusion  Daily SCr d/t risk of nephrotox from above combination  Consider stopping vanc with GPCs in pairs (no clusters seen on stain)   Height: 5' 7.5" (171.5 cm) Weight: 280 lb (127 kg) IBW/kg (Calculated) : 62.75  Temp (24hrs), Avg:98.6 F (37 C), Min:98.3 F (36.8 C), Max:99.1 F (37.3 C)   Recent Labs Lab 06/14/17 2259  WBC 13.3*  CREATININE 0.58    Estimated Creatinine Clearance: 145 mL/min (by C-G formula based on SCr of 0.58 mg/dL).    No Known Allergies  Antimicrobials this admission: Vanc 9/27 >>  Zosyn 9/28 >>   Dose adjustments this admission: ---  Microbiology results: 9/28 BCx: sent (after on vanc) 9/27 wound Cx: abundant GN coccobacilli, abundant GPCs in pairs    Bernadene Person, PharmD, BCPS Pager: [phone] 06/15/2017, 2:16 PM

## 2017-06-15 NOTE — ED Notes (Signed)
Report called to [facility] with [facility]. ETA of approximately 20 minutes.

## 2017-06-15 NOTE — ED Notes (Signed)
Pt given soup per request. Pt made aware she is awaiting admit bed and reason for delay.

## 2017-06-15 NOTE — ED Notes (Signed)
Pt given diet sprite, cheese, trail mix and grits per [employer]

## 2017-06-15 NOTE — ED Notes (Signed)
MD made aware of patient's BP and glucose. No new orders received.

## 2017-06-15 NOTE — Progress Notes (Signed)
Inpatient Diabetes Program Recommendations  AACE/ADA: New Consensus Statement on Inpatient Glycemic Control (2015)  Target Ranges:  Prepandial:   less than 140 mg/dL      Peak postprandial:   less than 180 mg/dL (1-2 hours)      Critically ill patients:  140 - 180 mg/dL   Lab Results  Component Value Date   IRJJOA 416 (H) 06/15/2017   HGBA1C 10.4 (H) 06/15/2017    Review of Glycemic Control  Diabetes history: DM2 Outpatient Diabetes medications: metformin (was not taking) Current orders for Inpatient glycemic control: Lantus 15 units QD, Novolog 0-9 units tidwc BMI - 43! HgbA1C of 10.4% indicates uncontrolled diabetes  Inpatient Diabetes Program Recommendations:    Increase Lantus to 20 units QD (127 kg x 0.15) Add Novolog HS correction Will likely need meal coverage insulin - Novolog 3 units tidwc if pt eats > 50% meal. Will order diabetes book and videos Will order insulin starter kit and RN to begin teaching insulin administration. OP Diabetes Education consult  Spoke to pt briefly regarding her HgbA1C. Discussed glucose and HgbA1C goals. Pt states she has been ignoring her diabetes, just did not want to think about it. States she needs a PCP to manage it. Has meter at home, but has not used in awhile. Seems interested in making lifestyle changes to control blood sugars.  Discussed above with RN regarding bedside education.  Continue to follow. Thank you. Lorenda Peck, RD, LDN, CDE Inpatient Diabetes Coordinator [phone]

## 2017-06-15 NOTE — Progress Notes (Signed)
Patient ID: Carolyn Zimmerman, female   DOB: 03/09/1986, 31 y.o.   MRN: [id number]  Accepted to MedSurg bed for IV antibiotic therapy for chest wall abscess and cellulitis with uncontrolled diabetes. Underwent I&D by Dr. Rubin Payor and received vancomycin. Wound C&S sent. Please follow. See other labs below.  Per Dr. Rubin Payor:  HPI Carolyn Zimmerman is a 31 y.o. female.  HPI Patient has a history of hypertension and diabetes. She's been off her medicines for a year. Over the last 5 days she's had swelling on her anterior chest between her breasts. She's had abscesses in this area before. Has had chills without frank fever. Has had some nausea. Has been urinating frequently over the last 5 days also. His had to get up numerous times at night. Does not have a primary care doctor.    Component Value Units  Urinalysis, Microscopic (reflex) [[id number]] (Abnormal) Collected: 06/14/17 2350  Updated: 06/15/17 0009    RBC / HPF NONE SEEN RBC/hpf   WBC, UA NONE SEEN WBC/hpf   Bacteria, UA RARE (A)   Squamous Epithelial / LPF 0-5 (A)  Urinalysis, Routine w reflex microscopic [[id number]] (Abnormal) Collected: 06/14/17 2350  Updated: 06/15/17 0009   Specimen Type: Urine    Color, Urine YELLOW   APPearance CLEAR   Specific Gravity, Urine <1.005 (L)   pH 7.5   Glucose, UA >=500 (A) mg/dL   Hgb urine dipstick NEGATIVE   Bilirubin Urine NEGATIVE   Ketones, ur NEGATIVE mg/dL   Protein, ur NEGATIVE mg/dL   Nitrite NEGATIVE   Leukocytes, UA NEGATIVE  Pregnancy, urine [[id number]] Collected: 06/14/17 2350  Updated: 06/15/17 0004   Specimen Type: Urine    Preg Test, Ur NEGATIVE  Wound or Superficial Culture [[id number]] Collected: 06/14/17 2300  Updated: 06/14/17 2337   Specimen Type: Wound   Specimen Source: Soft Tissue Abscess   Basic metabolic panel [[id number]] (Abnormal) Collected: 06/14/17 2259  Updated: 06/14/17 2328   Specimen Type: Blood    Sodium 132 (L) mmol/L   Potassium 3.4 (L) mmol/L   Chloride 101 mmol/L   CO2 24 mmol/L   Glucose, Bld 367 (H) mg/dL   BUN 8 mg/dL   Creatinine, Ser 5.36 mg/dL   Calcium 8.6 (L) mg/dL   GFR calc non Af Amer >60 mL/min   GFR calc Af Amer >60 mL/min   Anion gap 7  CBC with Differential [[id number]] (Abnormal) Collected: 06/14/17 2259  Updated: 06/14/17 2316   Specimen Type: Blood    WBC 13.3 (H) K/uL   RBC 4.13 MIL/uL   Hemoglobin 11.0 (L) g/dL   HCT 59.5 (L) %   MCV 82.3 fL   MCH 26.6 pg   MCHC 32.4 g/dL   RDW 63.8 %   Platelets 313 K/uL   Neutrophils Relative % 63 %   Neutro Abs 8.4 (H) K/uL   Lymphocytes Relative 25 %   Lymphs Abs 3.3 K/uL   Monocytes Relative 10 %   Monocytes Absolute 1.4 (H) K/uL   Eosinophils Relative 2 %   Eosinophils Absolute 0.2 K/uL   Basophils Relative 0 %   Basophils Absolute 0.0 K/uL    Sanda Klein, MD.

## 2017-06-15 NOTE — ED Notes (Signed)
Pt resting at this time. NAD noted. Call bell within reach.

## 2017-06-16 DIAGNOSIS — L02213 Cutaneous abscess of chest wall: Principal | ICD-10-CM

## 2017-06-16 LAB — CBC
HCT: 36.8 % (ref 36.0–46.0)
HEMOGLOBIN: 12 g/dL (ref 12.0–15.0)
MCH: 26.3 pg (ref 26.0–34.0)
MCHC: 32.6 g/dL (ref 30.0–36.0)
MCV: 80.5 fL (ref 78.0–100.0)
Platelets: 367 10*3/uL (ref 150–400)
RBC: 4.57 MIL/uL (ref 3.87–5.11)
RDW: 14 % (ref 11.5–15.5)
WBC: 10.2 10*3/uL (ref 4.0–10.5)

## 2017-06-16 LAB — GLUCOSE, CAPILLARY
Glucose-Capillary: 290 mg/dL — ABNORMAL HIGH (ref 65–99)
Glucose-Capillary: 226 mg/dL — ABNORMAL HIGH (ref 65–99)
Glucose-Capillary: 257 mg/dL — ABNORMAL HIGH (ref 65–99)
Glucose-Capillary: 254 mg/dL — ABNORMAL HIGH (ref 65–99)

## 2017-06-16 LAB — COMPREHENSIVE METABOLIC PANEL
ALT: 204 U/L — ABNORMAL HIGH (ref 14–54)
ANION GAP: 7 (ref 5–15)
AST: 281 U/L — AB (ref 15–41)
Albumin: 3.3 g/dL — ABNORMAL LOW (ref 3.5–5.0)
Alkaline Phosphatase: 398 U/L — ABNORMAL HIGH (ref 38–126)
BUN: 6 mg/dL (ref 6–20)
CO2: 28 mmol/L (ref 22–32)
Calcium: 8.9 mg/dL (ref 8.9–10.3)
Chloride: 103 mmol/L (ref 101–111)
Creatinine, Ser: 0.49 mg/dL (ref 0.44–1.00)
GFR calc Af Amer: >60 mL/min (ref >60)
GFR calc non Af Amer: >60 mL/min (ref >60)
GLUCOSE: 284 mg/dL — AB (ref 65–99)
POTASSIUM: 4 mmol/L (ref 3.5–5.1)
SODIUM: 138 mmol/L (ref 135–145)
Total Bilirubin: 1 mg/dL (ref 0.3–1.2)
Total Protein: 7.9 g/dL (ref 6.5–8.1)

## 2017-06-16 LAB — HIV ANTIBODY (ROUTINE TESTING W REFLEX): HIV SCREEN 4TH GENERATION: NONREACTIVE

## 2017-06-16 MED ORDER — PROMETHAZINE HCL 25 MG/ML IJ SOLN
12.5000 mg | Freq: Four times a day (QID) | INTRAMUSCULAR | Status: DC | PRN
  Administered 2017-06-16 – 2017-06-17 (×2): 12.5 mg via INTRAVENOUS
  Filled 2017-06-16 (×2): qty 1

## 2017-06-16 MED ORDER — KETOROLAC TROMETHAMINE 30 MG/ML IJ SOLN
30.0000 mg | Freq: Four times a day (QID) | INTRAMUSCULAR | Status: DC | PRN
Start: 2017-06-16 — End: 2017-06-17
  Administered 2017-06-16: 30 mg via INTRAVENOUS
  Filled 2017-06-16: qty 1

## 2017-06-16 MED ORDER — INSULIN ASPART 100 UNIT/ML ~~LOC~~ SOLN
5.0000 [IU] | Freq: Three times a day (TID) | SUBCUTANEOUS | Status: DC
  Administered 2017-06-16 – 2017-06-17 (×3): 5 [IU] via SUBCUTANEOUS

## 2017-06-16 MED ORDER — INSULIN ASPART 100 UNIT/ML ~~LOC~~ SOLN
0.0000 [IU] | Freq: Three times a day (TID) | SUBCUTANEOUS | Status: DC
  Administered 2017-06-16: 3 [IU] via SUBCUTANEOUS
  Administered 2017-06-16 (×2): 5 [IU] via SUBCUTANEOUS
  Administered 2017-06-17: 3 [IU] via SUBCUTANEOUS
  Administered 2017-06-17: 7 [IU] via SUBCUTANEOUS
  Administered 2017-06-17: 5 [IU] via SUBCUTANEOUS
  Administered 2017-06-18: 9 [IU] via SUBCUTANEOUS
  Administered 2017-06-18: 2 [IU] via SUBCUTANEOUS

## 2017-06-16 MED ORDER — AMLODIPINE BESYLATE 10 MG PO TABS
10.0000 mg | ORAL_TABLET | Freq: Every day | ORAL | Status: DC
  Administered 2017-06-16 – 2017-06-18 (×3): 10 mg via ORAL
  Filled 2017-06-16 (×3): qty 1

## 2017-06-16 MED ORDER — INSULIN ASPART 100 UNIT/ML ~~LOC~~ SOLN
0.0000 [IU] | Freq: Every day | SUBCUTANEOUS | Status: DC
Start: 2017-06-16 — End: 2017-06-17
  Administered 2017-06-16: 3 [IU] via SUBCUTANEOUS

## 2017-06-16 MED ORDER — MORPHINE SULFATE (PF) 4 MG/ML IV SOLN
2.0000 mg | Freq: Once | INTRAVENOUS | Status: AC
  Administered 2017-06-16: 2 mg via INTRAVENOUS
  Filled 2017-06-16: qty 1

## 2017-06-16 MED ORDER — INSULIN ASPART 100 UNIT/ML ~~LOC~~ SOLN
3.0000 [IU] | Freq: Three times a day (TID) | SUBCUTANEOUS | Status: DC
  Administered 2017-06-16 (×2): 3 [IU] via SUBCUTANEOUS

## 2017-06-16 MED ORDER — INSULIN GLARGINE 100 UNIT/ML ~~LOC~~ SOLN
20.0000 [IU] | Freq: Every day | SUBCUTANEOUS | Status: DC
  Administered 2017-06-16: 20 [IU] via SUBCUTANEOUS
  Filled 2017-06-16 (×2): qty 0.2

## 2017-06-16 MED ORDER — HYDROCHLOROTHIAZIDE 25 MG PO TABS
25.0000 mg | ORAL_TABLET | Freq: Every day | ORAL | Status: DC
  Administered 2017-06-16 – 2017-06-18 (×3): 25 mg via ORAL
  Filled 2017-06-16 (×3): qty 1

## 2017-06-16 NOTE — Progress Notes (Signed)
PROGRESS NOTE    Carolyn Zimmerman  ZOX:[id number] DOB: [birth year] DOA: 06/14/2017 PCP: Patient, No Pcp Per     Brief Narrative:  Carolyn Zimmerman is a 31 year old female with a history of diabetes, noncompliant with metformin, morbid obesity, hypertension who presents to the ED because of erythema and swelling and redness on her anterior chest wall between her breasts. Patient underwent incision and drainage in the emergency department, wound was packed. Patient was admitted for treatment with IV antibiotics as well as uncontrolled hypertension and uncontrolled diabetes.  Assessment & Plan:   Principal Problem:   Chest wall abscess Active Problems:   Hyperglycemia   Abscess   Hx of medication noncompliance   Chest wall abscess -Status post incision and drainage in the ED, wound is packed -Blood cultures pending -Wound culture pending -Continue vanco, zosyn   Hypertensive urgency -Start HCTZ, amlodipine, as well as when necessary hydralazine IV  Uncontrolled diabetes with hyperglycemia -Ha1c 10.4 -Appreciate diabetic coordinator -Lantus, NovoLog with meals, sliding scale insulin  Elevated liver enzymes -Trend LFT, check hepatitis panel   DVT prophylaxis: lovenox Code Status: full Family Communication: mother over the phone Disposition Plan: pending improvement   Consultants:   none  Procedures:   none  Antimicrobials:  Anti-infectives    Start     Dose/Rate Route Frequency Ordered Stop   06/15/17 1400  piperacillin-tazobactam (ZOSYN) IVPB 3.375 g     3.375 g 12.5 mL/hr over 240 Minutes Intravenous Every 8 hours 06/15/17 1252     06/15/17 0800  vancomycin (VANCOCIN) IVPB 1000 mg/200 mL premix     1,000 mg 200 mL/hr over 60 Minutes Intravenous Every 8 hours 06/15/17 0601     06/15/17 0100  vancomycin (VANCOCIN) IVPB 1000 mg/200 mL premix     1,000 mg 200 mL/hr over 60 Minutes Intravenous  Once 06/14/17 2345 06/15/17 0200   06/14/17 2359  vancomycin (VANCOCIN) IVPB  1000 mg/200 mL premix     1,000 mg 200 mL/hr over 60 Minutes Intravenous  Once 06/14/17 2344 06/15/17 0059        Subjective: Patient is very angry and tearful during exam. She wants to go home. She states that we are not doing anything for her. Has no other physical complaints  Objective: Vitals:   06/15/17 1458 06/15/17 2243 06/16/17 0526 06/16/17 0632  BP: (!) 151/92 (!) 185/118 (!) 197/127 (!) 179/107  Pulse: 76 80 79 86  Resp: Temp: 98.2 F (36.8 C) 98.3 F (36.8 C) 98.2 F (36.8 C)   TempSrc: Oral Oral Oral   SpO2: 100% 100% 100%   Weight:      Height:        Intake/Output Summary (Last 24 hours) at 06/16/17 1342 Last data filed at 06/16/17 0958  Gross per 24 hour  Intake             1290 ml  Output                0 ml  Net             1290 ml   Filed Weights   06/14/17 2221  Weight: 127 kg (280 lb)    Examination:  General exam: Appears irritated, angry, frustrated, tearful, otherwise comfortable  Respiratory system: Clear to auscultation. Respiratory effort normal. Cardiovascular system: S1 & S2 heard, RRR. No JVD, murmurs, rubs, gallops or clicks. No pedal edema. Gastrointestinal system: Abdomen is nondistended, soft and nontender. No organomegaly or masses felt. Normal  bowel sounds heard. Central nervous system: Alert and oriented. No focal neurological deficits. Extremities: Symmetric 5 x 5 power. Skin: +anterior chest wall abscess with packing in place  Psychiatry: Poor insight   Data Reviewed: I have personally reviewed following labs and imaging studies  CBC:  Recent Labs Lab 06/14/17 2259 06/16/17 0527  WBC 13.3* 10.2  NEUTROABS 8.4*  --   HGB 11.0* 12.0  HCT 34.0* 36.8  MCV 82.3 80.5  PLT 313 367   Basic Metabolic Panel:  Recent Labs Lab 06/14/17 2259 06/15/17 1328 06/16/17 0527  NA 132*  --  138  K 3.4*  --  4.0  CL 101  --  103  CO2 24  --  28  GLUCOSE 367*  --  284*  BUN 8  --  6  CREATININE 0.58  --  0.49    CALCIUM 8.6*  --  8.9  MG  --  1.9  --    GFR: Estimated Creatinine Clearance: 145 mL/min (by C-G formula based on SCr of 0.49 mg/dL). Liver Function Tests:  Recent Labs Lab 06/16/17 0527  AST 281*  ALT 204*  ALKPHOS 398*  BILITOT 1.0  PROT 7.9  ALBUMIN 3.3*   No results for input(s): LIPASE, AMYLASE in the last 168 hours. No results for input(s): AMMONIA in the last 168 hours. Coagulation Profile: No results for input(s): INR, PROTIME in the last 168 hours. Cardiac Enzymes: No results for input(s): CKTOTAL, CKMB, CKMBINDEX, TROPONINI in the last 168 hours. BNP (last 3 results) No results for input(s): PROBNP in the last 8760 hours. HbA1C:  Recent Labs  06/15/17 1328  HGBA1C 10.4*   CBG:  Recent Labs Lab 06/15/17 0825 06/15/17 1644 06/15/17 2248 06/16/17 0729 06/16/17 1132  GLUCAP 264* 272* 284* 290* 226*   Lipid Profile: No results for input(s): CHOL, HDL, LDLCALC, TRIG, CHOLHDL, LDLDIRECT in the last 72 hours. Thyroid Function Tests: No results for input(s): TSH, T4TOTAL, FREET4, T3FREE, THYROIDAB in the last 72 hours. Anemia Panel: No results for input(s): VITAMINB12, FOLATE, FERRITIN, TIBC, IRON, RETICCTPCT in the last 72 hours. Sepsis Labs: No results for input(s): PROCALCITON, LATICACIDVEN in the last 168 hours.  Recent Results (from the past 240 hour(s))  Wound or Superficial Culture     Status: None (Preliminary result)   Collection Time: 06/14/17 11:00 PM  Result Value Ref Range Status   Specimen Description CHEST  Final   Special Requests NONE  Final   Gram Stain   Final    FEW WBC PRESENT, PREDOMINANTLY PMN ABUNDANT GRAM NEGATIVE COCCOBACILLI ABUNDANT GRAM POSITIVE COCCI IN PAIRS Performed at [facility], [address], [place], [place] [address]    Culture PENDING  Incomplete   Report Status PENDING  Incomplete       Radiology Studies: No results found.    Scheduled Meds: . amLODipine  10 mg Oral Daily  . enoxaparin  (LOVENOX) injection  40 mg Subcutaneous Q24H  . hydrochlorothiazide  25 mg Oral Daily  . insulin aspart  0-5 Units Subcutaneous QHS  . insulin aspart  0-9 Units Subcutaneous TID WC  . insulin aspart  3 Units Subcutaneous TID WC  . insulin glargine  20 Units Subcutaneous Daily  . pneumococcal 23 valent vaccine  0.5 mL Intramuscular Tomorrow-1000   Continuous Infusions: . piperacillin-tazobactam (ZOSYN)  IV Stopped (06/16/17 0928)  . vancomycin Stopped (06/16/17 0901)     LOS: 1 day    Time spent: 40 minutes   Noralee Stain, DO  [facility] [email or web address] Password [email or web address] 06/16/2017, 1:42 PM

## 2017-06-16 NOTE — Progress Notes (Addendum)
Inpatient Diabetes Program Recommendations  AACE/ADA: New Consensus Statement on Inpatient Glycemic Control (2015)  Target Ranges:  Prepandial:   less than 140 mg/dL      Peak postprandial:   less than 180 mg/dL (1-2 hours)      Critically ill patients:  140 - 180 mg/dL   Results for Carolyn Zimmerman, Carolyn Zimmerman (MRN [id number]) as of 06/16/2017 15:12  Ref. Range 06/15/2017 08:25 06/15/2017 16:44 06/15/2017 22:48  Glucose-Capillary Latest Ref Range: 65 - 99 mg/dL 086 (H) 578 (H) 469 (H)   Results for Carolyn Zimmerman, Carolyn Zimmerman (MRN [id number]) as of 06/16/2017 15:12  Ref. Range 06/16/2017 07:29 06/16/2017 11:32  Glucose-Capillary Latest Ref Range: 65 - 99 mg/dL 244 (H)  8 units Novolog total 226 (H)  6 units Novolog total    Home DM Meds: Metformin (was not taking)  Current Insulin Orders: Lantus 20 units daily       Novolog Sensitive Correction Scale/ SSI (0-9 units) TID AC + HS      Novolog 3 units TID with meals     MD- Note Lantus increased this AM and Novolog 3 units Meal Coverage started today at 8am.  If CBGs stay persistently elevated today and if CBG tomorrow AM is elevated, please consider the following:  1. Increase Lantus to 25 units daily (0.2 units/kg dosing)  2. Increase Novolog Meal Coverage to: Novolog 5 units TID with meals (hold if pt eats <50% of meal)  3. May consider switching patient to affordable insulin at some point during hospitalization due to affordability issues- Patient does not have insurance and will likely not be able to afford Lantus and Novolog at discharge.  May consider conversion to 70/30 insulin BID for affordability at time of d/c.  70/30 Insulin can be purchased at [facility] for $25 per vial.      --Will follow patient during hospitalization--  Ambrose Finland RN, MSN, CDE Diabetes Coordinator Inpatient Glycemic Control Team Team Pager: [phone] (8a-5p)

## 2017-06-17 ENCOUNTER — Inpatient Hospital Stay (HOSPITAL_COMMUNITY): Payer: Medicaid Other

## 2017-06-17 LAB — CBC WITH DIFFERENTIAL/PLATELET
BASOS ABS: 0 10*3/uL (ref 0.0–0.1)
Basophils Relative: 0 %
EOS ABS: 0.2 10*3/uL (ref 0.0–0.7)
EOS PCT: 2 %
HCT: 37.8 % (ref 36.0–46.0)
Hemoglobin: 12.2 g/dL (ref 12.0–15.0)
LYMPHS PCT: 26 %
Lymphs Abs: 2.5 10*3/uL (ref 0.7–4.0)
MCH: 26.2 pg (ref 26.0–34.0)
MCHC: 32.3 g/dL (ref 30.0–36.0)
MCV: 81.3 fL (ref 78.0–100.0)
Monocytes Absolute: 1.1 10*3/uL — ABNORMAL HIGH (ref 0.1–1.0)
Monocytes Relative: 11 %
NEUTROS PCT: 61 %
Neutro Abs: 5.8 10*3/uL (ref 1.7–7.7)
PLATELETS: 362 10*3/uL (ref 150–400)
RBC: 4.65 MIL/uL (ref 3.87–5.11)
RDW: 14.2 % (ref 11.5–15.5)
WBC: 9.7 10*3/uL (ref 4.0–10.5)

## 2017-06-17 LAB — VANCOMYCIN, TROUGH: Vancomycin Tr: 12 ug/mL — ABNORMAL LOW (ref 15–20)

## 2017-06-17 LAB — COMPREHENSIVE METABOLIC PANEL
ALT: 340 U/L — AB (ref 14–54)
AST: 404 U/L — AB (ref 15–41)
Albumin: 3 g/dL — ABNORMAL LOW (ref 3.5–5.0)
Alkaline Phosphatase: 379 U/L — ABNORMAL HIGH (ref 38–126)
Anion gap: 8 (ref 5–15)
BUN: 8 mg/dL (ref 6–20)
CHLORIDE: 102 mmol/L (ref 101–111)
CO2: 27 mmol/L (ref 22–32)
CREATININE: 0.56 mg/dL (ref 0.44–1.00)
Calcium: 9.1 mg/dL (ref 8.9–10.3)
GFR calc Af Amer: >60 mL/min (ref >60)
GFR calc non Af Amer: >60 mL/min (ref >60)
GLUCOSE: 287 mg/dL — AB (ref 65–99)
Potassium: 3.7 mmol/L (ref 3.5–5.1)
SODIUM: 137 mmol/L (ref 135–145)
Total Bilirubin: 2 mg/dL — ABNORMAL HIGH (ref 0.3–1.2)
Total Protein: 7.5 g/dL (ref 6.5–8.1)

## 2017-06-17 LAB — GLUCOSE, CAPILLARY
GLUCOSE-CAPILLARY: 192 mg/dL — AB (ref 65–99)
Glucose-Capillary: 280 mg/dL — ABNORMAL HIGH (ref 65–99)
Glucose-Capillary: 307 mg/dL — ABNORMAL HIGH (ref 65–99)
Glucose-Capillary: 241 mg/dL — ABNORMAL HIGH (ref 65–99)

## 2017-06-17 LAB — HEPATITIS PANEL, ACUTE
HCV Ab: <0.1 s/co ratio (ref 0.0–0.9)
Hep A IgM: NEGATIVE
Hep B C IgM: NEGATIVE
Hepatitis B Surface Ag: NEGATIVE

## 2017-06-17 LAB — LIPASE, BLOOD: Lipase: 28 U/L (ref 11–51)

## 2017-06-17 MED ORDER — SUMATRIPTAN SUCCINATE 25 MG PO TABS
25.0000 mg | ORAL_TABLET | ORAL | Status: DC | PRN
  Administered 2017-06-17: 25 mg via ORAL
  Filled 2017-06-17: qty 1

## 2017-06-17 MED ORDER — AMOXICILLIN-POT CLAVULANATE 875-125 MG PO TABS
1.0000 | ORAL_TABLET | Freq: Two times a day (BID) | ORAL | Status: DC
  Administered 2017-06-17 – 2017-06-18 (×3): 1 via ORAL
  Filled 2017-06-17 (×3): qty 1

## 2017-06-17 MED ORDER — INSULIN ASPART PROT & ASPART (70-30 MIX) 100 UNIT/ML ~~LOC~~ SUSP
25.0000 [IU] | Freq: Two times a day (BID) | SUBCUTANEOUS | Status: DC
  Administered 2017-06-17: 25 [IU] via SUBCUTANEOUS
  Filled 2017-06-17: qty 10

## 2017-06-17 MED ORDER — DIPHENHYDRAMINE HCL 50 MG/ML IJ SOLN
12.5000 mg | Freq: Once | INTRAMUSCULAR | Status: AC
  Administered 2017-06-17: 12.5 mg via INTRAVENOUS
  Filled 2017-06-17: qty 1

## 2017-06-17 MED ORDER — LISINOPRIL 20 MG PO TABS: 20.0000 mg | ORAL_TABLET | Freq: Every day | ORAL | Status: DC

## 2017-06-17 MED ORDER — INSULIN GLARGINE 100 UNIT/ML ~~LOC~~ SOLN
25.0000 [IU] | Freq: Every day | SUBCUTANEOUS | Status: DC
  Administered 2017-06-17: 25 [IU] via SUBCUTANEOUS
  Filled 2017-06-17: qty 0.25

## 2017-06-17 MED ORDER — LISINOPRIL 10 MG PO TABS
10.0000 mg | ORAL_TABLET | Freq: Every day | ORAL | Status: DC
  Administered 2017-06-17 – 2017-06-18 (×2): 10 mg via ORAL
  Filled 2017-06-17 (×2): qty 1

## 2017-06-17 MED ORDER — TRAMADOL-ACETAMINOPHEN 37.5-325 MG PO TABS
1.0000 | ORAL_TABLET | ORAL | Status: DC | PRN
  Administered 2017-06-17: 1 via ORAL
  Filled 2017-06-17: qty 1

## 2017-06-17 NOTE — Progress Notes (Signed)
PROGRESS NOTE    Carolyn Zimmerman  BJY:[id number] DOB: 02/05/1986 DOA: 06/14/2017 PCP: Patient, No Pcp Per     Brief Narrative:  Carolyn Zimmerman is a 31 year old female with a history of diabetes, noncompliant with metformin, morbid obesity, hypertension who presents to the ED because of erythema and swelling and redness on her anterior chest wall between her breasts. Patient underwent incision and drainage in the emergency department, wound was packed. Patient was admitted for treatment with IV antibiotics as well as uncontrolled hypertension and uncontrolled diabetes.  Assessment & Plan:   Principal Problem:   Chest wall abscess Active Problems:   Hyperglycemia   Abscess   Hx of medication noncompliance   Chest wall abscess -Status post incision and drainage in the ED, wound is packed -Blood cultures pending -Wound culture pending -Continue vanco, zosyn. Deescalate to augmentin   Hypertensive urgency -Start HCTZ, amlodipine, lisinopril as well as when necessary hydralazine IV  Uncontrolled diabetes with hyperglycemia -Ha1c 10.4 -Appreciate diabetic coordinator -Transition to Novolog 70/30 25 units BID with Novolog SSI   Elevated liver enzymes -Hepatitis panel negative -Trend LFT -Stop tylenol -Check RUQ [place]   Migraine -Imitrex    DVT prophylaxis: lovenox Code Status: full Family Communication: mother over the phone 9/29 Disposition Plan: pending improvement   Consultants:   none  Procedures:   none  Antimicrobials:  Anti-infectives    Start     Dose/Rate Route Frequency Ordered Stop   06/17/17 1345  amoxicillin-clavulanate (AUGMENTIN) 875-125 MG per tablet 1 tablet     1 tablet Oral Every 12 hours 06/17/17 1334     06/15/17 1400  piperacillin-tazobactam (ZOSYN) IVPB 3.375 g  Status:  Discontinued     3.375 g 12.5 mL/hr over 240 Minutes Intravenous Every 8 hours 06/15/17 1252 06/17/17 1334   06/15/17 0800  vancomycin (VANCOCIN) IVPB 1000 mg/200 mL premix   Status:  Discontinued     1,000 mg 200 mL/hr over 60 Minutes Intravenous Every 8 hours 06/15/17 0601 06/17/17 1334   06/15/17 0100  vancomycin (VANCOCIN) IVPB 1000 mg/200 mL premix     1,000 mg 200 mL/hr over 60 Minutes Intravenous  Once 06/14/17 2345 06/15/17 0200   06/14/17 2359  vancomycin (VANCOCIN) IVPB 1000 mg/200 mL premix     1,000 mg 200 mL/hr over 60 Minutes Intravenous  Once 06/14/17 2344 06/15/17 0059       Subjective: Doing much better this morning. Complains of headache that is severe, worse with light and sounds, had episodes of severe vomiting last night.   Objective: Vitals:   06/16/17 1347 06/16/17 2153 06/17/17 0628 06/17/17 1322  BP: (!) 158/97 (!) 155/91 (!) 158/88 (!) 169/98  Pulse: 74 72 73 83  Resp: Temp: 98.6 F (37 C) 98.5 F (36.9 C) 98.1 F (36.7 C) 98.3 F (36.8 C)  TempSrc: Oral Oral Oral Oral  SpO2: 100% 100% 100% 100%  Weight:      Height:        Intake/Output Summary (Last 24 hours) at 06/17/17 1336 Last data filed at 06/17/17 1110  Gross per 24 hour  Intake              480 ml  Output                0 ml  Net              480 ml   Filed Weights   06/14/17 2221  Weight: 127 kg (280 lb)  Examination:  General exam: Appears calm and comfortable  Respiratory system: Clear to auscultation. Respiratory effort normal. Cardiovascular system: S1 & S2 heard, RRR. No JVD, murmurs, rubs, gallops or clicks. No pedal edema. Gastrointestinal system: Abdomen is nondistended, soft and nontender. No organomegaly or masses felt. Normal bowel sounds heard. Central nervous system: Alert and oriented. No focal neurological deficits. Extremities: Symmetric 5 x 5 power. Skin: +anterior chest wall abscess with packing in place, no erythema or drainage noted  Psychiatry: Mood appropriate   Data Reviewed: I have personally reviewed following labs and imaging studies  CBC:  Recent Labs Lab 06/14/17 2259 06/16/17 0527 06/17/17 0713    WBC 13.3* 10.2 9.7  NEUTROABS 8.4*  --  5.8  HGB 11.0* 12.0 12.2  HCT 34.0* 36.8 37.8  MCV 82.3 80.5 81.3  PLT 313 367 362   Basic Metabolic Panel:  Recent Labs Lab 06/14/17 2259 06/15/17 1328 06/16/17 0527 06/17/17 0713  NA 132*  --  138 137  K 3.4*  --  4.0 3.7  CL 101  --  103 102  CO2 24  --  28 27  GLUCOSE 367*  --  284* 287*  BUN 8  --  6 8  CREATININE 0.58  --  0.49 0.56  CALCIUM 8.6*  --  8.9 9.1  MG  --  1.9  --   --    GFR: Estimated Creatinine Clearance: 145 mL/min (by C-G formula based on SCr of 0.56 mg/dL). Liver Function Tests:  Recent Labs Lab 06/16/17 0527 06/17/17 0713  AST 281* 404*  ALT 204* 340*  ALKPHOS 398* 379*  BILITOT 1.0 2.0*  PROT 7.9 7.5  ALBUMIN 3.3* 3.0*    Recent Labs Lab 06/17/17 0713  LIPASE 28   No results for input(s): AMMONIA in the last 168 hours. Coagulation Profile: No results for input(s): INR, PROTIME in the last 168 hours. Cardiac Enzymes: No results for input(s): CKTOTAL, CKMB, CKMBINDEX, TROPONINI in the last 168 hours. BNP (last 3 results) No results for input(s): PROBNP in the last 8760 hours. HbA1C:  Recent Labs  06/15/17 1328  HGBA1C 10.4*   CBG:  Recent Labs Lab 06/16/17 1132 06/16/17 1704 06/16/17 2151 06/17/17 0720 06/17/17 1140  GLUCAP 226* 257* 254* 280* 307*   Lipid Profile: No results for input(s): CHOL, HDL, LDLCALC, TRIG, CHOLHDL, LDLDIRECT in the last 72 hours. Thyroid Function Tests: No results for input(s): TSH, T4TOTAL, FREET4, T3FREE, THYROIDAB in the last 72 hours. Anemia Panel: No results for input(s): VITAMINB12, FOLATE, FERRITIN, TIBC, IRON, RETICCTPCT in the last 72 hours. Sepsis Labs: No results for input(s): PROCALCITON, LATICACIDVEN in the last 168 hours.  Recent Results (from the past 240 hour(s))  Wound or Superficial Culture     Status: None (Preliminary result)   Collection Time: 06/14/17 11:00 PM  Result Value Ref Range Status   Specimen Description CHEST   Final   Special Requests NONE  Final   Gram Stain   Final    FEW WBC PRESENT, PREDOMINANTLY PMN ABUNDANT GRAM NEGATIVE COCCOBACILLI ABUNDANT GRAM POSITIVE COCCI IN PAIRS    Culture   Final    CULTURE REINCUBATED FOR BETTER GROWTH Performed at [facility], [address], [place], [place] [address]    Report Status PENDING  Incomplete  Culture, blood (routine x 2)     Status: None (Preliminary result)   Collection Time: 06/15/17  1:49 PM  Result Value Ref Range Status   Specimen Description BLOOD LEFT HAND  Final   Special Requests IN PEDIATRIC BOTTLE Blood Culture adequate volume  Final   Culture   Final    NO GROWTH < 24 HOURS Performed at [facility], [address], [place], [place] [address]    Report Status PENDING  Incomplete  Culture, blood (routine x 2)     Status: None (Preliminary result)   Collection Time: 06/15/17  1:55 PM  Result Value Ref Range Status   Specimen Description BLOOD RIGHT HAND  Final   Special Requests IN PEDIATRIC BOTTLE Blood Culture adequate volume  Final   Culture   Final    NO GROWTH < 24 HOURS Performed at [facility], [address] [place], [place] [address]    Report Status PENDING  Incomplete       Radiology Studies: No results found.    Scheduled Meds: . amLODipine  10 mg Oral Daily  . amoxicillin-clavulanate  1 tablet Oral Q12H  . enoxaparin (LOVENOX) injection  40 mg Subcutaneous Q24H  . hydrochlorothiazide  25 mg Oral Daily  . insulin aspart  0-9 Units Subcutaneous TID WC  . insulin aspart protamine- aspart  25 Units Subcutaneous BID WC  . lisinopril  10 mg Oral Daily   Continuous Infusions:    LOS: 2 days    Time spent: 30 minutes   Noralee Stain, DO [facility] [email or web address] Password [email or web address] 06/17/2017, 1:36 PM

## 2017-06-17 NOTE — Progress Notes (Signed)
Inpatient Diabetes Program Recommendations  AACE/ADA: New Consensus Statement on Inpatient Glycemic Control (2015)  Target Ranges:  Prepandial:   less than 140 mg/dL      Peak postprandial:   less than 180 mg/dL (1-2 hours)      Critically ill patients:  140 - 180 mg/dL   Results for Carolyn Zimmerman, Carolyn Zimmerman (MRN [id number]) as of 06/17/2017 10:44  Ref. Range 06/16/2017 07:29 06/16/2017 11:32 06/16/2017 17:04 06/16/2017 21:51  Glucose-Capillary Latest Ref Range: 65 - 99 mg/dL 010 (H)  8 units Novolog +  20 units Lantus 226 (H)  6 units Novolog 257 (H)  10 units Novolog 254 (H)  3 units Novolog   Results for Carolyn Zimmerman, Carolyn Zimmerman (MRN [id number]) as of 06/17/2017 10:44  Ref. Range 06/17/2017 07:20  Glucose-Capillary Latest Ref Range: 65 - 99 mg/dL 034 (H)  10 units Novolog +  25 units Lantus     Home DM Meds: Metformin (was not taking)  Current Insulin Orders: Lantus 25 units daily                                       Novolog Sensitive Correction Scale/ SSI (0-9 units) TID AC + HS                                       Novolog 5 units TID with meals      Called by MD to discuss converting patient to 70/30 insulin for affordability.  Note Lantus and Novolog Meal Coverage both increased this AM due to persistently elevated CBGs.   Recommend we convert to 70/30 Insulin this evening with supper since patient has already received Lantus this AM:  Recommend 70/30 Insulin 25 units BID with meals- Start tonight with supper.  (This will give pt approximately 35 units basal insulin throughout the day.  She will also get approximately 7.5 units rapid-acting insulin with both breakfast and supper.)  Can continue Novolog SSI but will need to discontinue Novolog 5 units TID with meals as extra Novolog meal coverage not recommended to be given with 70/30 Insulin     --Will follow patient during hospitalization--  Ambrose Finland RN, MSN, CDE Diabetes Coordinator Inpatient Glycemic Control  Team Team Pager: [phone] (8a-5p)

## 2017-06-17 NOTE — Progress Notes (Signed)
Pharmacy Antibiotic Note  Carolyn Zimmerman is a 31 y.o. female admitted on 06/14/2017 with chest wall abcess/cellulitis.  Pharmacy has been consulted for Vancomycin  Dosing. Zosyn added later in AM  Today, 06/17/2017  Day #3 abx  vanco trough = 12 mcg/mL  SCr stable  WBC WNL  Afebrile  Wound culture final result still pending  Plan:  Vancomycin 1 g IV q8h - trough within goal of 10-15 mcg/ml for skin/soft tissue infection  Zosyn 3.375 g IV given once over 30 minutes, then every 8 hrs by 4-hr infusion  Daily SCr d/t risk of nephrotox from above combination  Narrow antibiotics with final wound culture result   Height: 5' 7.5" (171.5 cm) Weight: 280 lb (127 kg) IBW/kg (Calculated) : 62.75  Temp (24hrs), Avg:98.4 F (36.9 C), Min:98.1 F (36.7 C), Max:98.6 F (37 C)   Recent Labs Lab 06/14/17 2259 06/16/17 0527 06/17/17 0713  WBC 13.3* 10.2 9.7  CREATININE 0.58 0.49 0.56  VANCOTROUGH  --   --  12*    Estimated Creatinine Clearance: 145 mL/min (by C-G formula based on SCr of 0.56 mg/dL).    No Known Allergies  Antimicrobials this admission: Vanc 9/27 >>  Zosyn 9/28 >>   Dose adjustments this admission: 9/30 0713 VT = 12 mcg/ml on 1gm q8h (prior to 7th dose)  Microbiology results: 9/28 BCx: sent (after on vanc) 9/27 wound Cx: abundant GN coccobacilli, abundant GPCs in pairs   Juliette Alcide, PharmD, BCPS.   Pager: [phone] 06/17/2017 10:43 AM

## 2017-06-18 ENCOUNTER — Inpatient Hospital Stay (HOSPITAL_COMMUNITY): Payer: Medicaid Other

## 2017-06-18 LAB — CBC WITH DIFFERENTIAL/PLATELET
BASOS ABS: 0 10*3/uL (ref 0.0–0.1)
BASOS PCT: 0 %
EOS PCT: 3 %
Eosinophils Absolute: 0.3 10*3/uL (ref 0.0–0.7)
HCT: 41.5 % (ref 36.0–46.0)
Hemoglobin: 13.5 g/dL (ref 12.0–15.0)
LYMPHS PCT: 31 %
Lymphs Abs: 3.6 10*3/uL (ref 0.7–4.0)
MCH: 26.4 pg (ref 26.0–34.0)
MCHC: 32.5 g/dL (ref 30.0–36.0)
MCV: 81.1 fL (ref 78.0–100.0)
Monocytes Absolute: 1.1 10*3/uL — ABNORMAL HIGH (ref 0.1–1.0)
Monocytes Relative: 9 %
NEUTROS ABS: 6.7 10*3/uL (ref 1.7–7.7)
Neutrophils Relative %: 57 %
PLATELETS: 406 10*3/uL — AB (ref 150–400)
RBC: 5.12 MIL/uL — AB (ref 3.87–5.11)
RDW: 14.4 % (ref 11.5–15.5)
WBC: 11.7 10*3/uL — AB (ref 4.0–10.5)

## 2017-06-18 LAB — COMPREHENSIVE METABOLIC PANEL
ALBUMIN: 3.3 g/dL — AB (ref 3.5–5.0)
ALK PHOS: 423 U/L — AB (ref 38–126)
ALT: 329 U/L — AB (ref 14–54)
AST: 165 U/L — AB (ref 15–41)
Anion gap: 10 (ref 5–15)
BILIRUBIN TOTAL: 0.8 mg/dL (ref 0.3–1.2)
BUN: 9 mg/dL (ref 6–20)
CALCIUM: 9.3 mg/dL (ref 8.9–10.3)
CO2: 25 mmol/L (ref 22–32)
Chloride: 102 mmol/L (ref 101–111)
Creatinine, Ser: 0.56 mg/dL (ref 0.44–1.00)
GFR calc Af Amer: >60 mL/min (ref >60)
GFR calc non Af Amer: >60 mL/min (ref >60)
GLUCOSE: 203 mg/dL — AB (ref 65–99)
POTASSIUM: 3.6 mmol/L (ref 3.5–5.1)
SODIUM: 137 mmol/L (ref 135–145)
TOTAL PROTEIN: 8.1 g/dL (ref 6.5–8.1)

## 2017-06-18 LAB — GLUCOSE, CAPILLARY
GLUCOSE-CAPILLARY: 352 mg/dL — AB (ref 65–99)
Glucose-Capillary: 191 mg/dL — ABNORMAL HIGH (ref 65–99)

## 2017-06-18 MED ORDER — AMLODIPINE BESYLATE 10 MG PO TABS: 10.0000 mg | ORAL_TABLET | Freq: Every day | ORAL | 0 refills | Status: DC

## 2017-06-18 MED ORDER — "INSULIN SYRINGE 31G X 5/16"" 0.3 ML MISC": 0 refills | Status: DC

## 2017-06-18 MED ORDER — LISINOPRIL 10 MG PO TABS: 10.0000 mg | ORAL_TABLET | Freq: Every day | ORAL | 0 refills | Status: DC

## 2017-06-18 MED ORDER — AMOXICILLIN-POT CLAVULANATE 875-125 MG PO TABS: 1.0000 | ORAL_TABLET | Freq: Two times a day (BID) | ORAL | 0 refills | Status: AC

## 2017-06-18 MED ORDER — HYDROCHLOROTHIAZIDE 25 MG PO TABS: 25.0000 mg | ORAL_TABLET | Freq: Every day | ORAL | 0 refills | Status: DC

## 2017-06-18 MED ORDER — BLOOD GLUCOSE METER KIT: PACK | 0 refills | Status: DC

## 2017-06-18 MED ORDER — INSULIN ASPART PROT & ASPART (70-30 MIX) 100 UNIT/ML ~~LOC~~ SUSP: 25.0000 [IU] | Freq: Two times a day (BID) | SUBCUTANEOUS | 0 refills | Status: DC

## 2017-06-18 MED ORDER — OXYCODONE HCL 5 MG PO TABS: 5.0000 mg | ORAL_TABLET | Freq: Once | ORAL | Status: DC | PRN

## 2017-06-18 NOTE — Discharge Summary (Signed)
Physician Discharge Summary  Carolyn Zimmerman JKD:[id number] DOB: [birth year] DOA: 06/14/2017  PCP: Patient, No Pcp Per  Admit date: 06/14/2017 Discharge date: 06/18/2017  Admitted From: Home Disposition:  Home  Recommendations for Outpatient Follow-up:  1. Follow up with PCP in 1 week 2. Reiterated importance of glycemic control, blood pressure control and close primary care follow-up 3. Recommend repeating liver function test as outpatient in 1 week 4. Wound culture is pending at time of discharge 5. Follow-up with diabetic ed   Home Health: No  Equipment/Devices: None   Discharge Condition: Stable CODE STATUS: Full  Diet recommendation: Carb modified   Brief/Interim Summary: Carolyn Zimmerman is a 31 year old female with a history of diabetes, noncompliant with metformin, morbid obesity, hypertension who presents to the ED because of erythema and swelling and redness on her anterior chest wall between her breasts. Patient underwent incision and drainage in the emergency department, wound was packed. Patient was admitted for treatment with IV antibiotics as well as uncontrolled hypertension and uncontrolled diabetes. During her hospitalization, blood pressure medications were started as well as insulin. Patient did meet with diabetic coordinator. Chest wall abscess continued to improve and antibiotic was switched to Augmentin. Wound packing was removed prior to discharge. Patient also was found to have elevated liver enzymes of unclear etiology. Hepatitis panel was negative, right upper quadrant ultrasound revealed hepatic steatosis. Due to uncontrolled migraine, CT head was completed which was unremarkable. Headache was well controlled prior to discharge. Patient was reiterated on the importance of following up with her primary care physician, glycemic control, blood pressure control as well as follow-up for her liver function tests.  Discharge Diagnoses:  Principal Problem:   Chest wall  abscess Active Problems:   Hyperglycemia   Abscess   Hx of medication noncompliance  Chest wall abscess -Status post incision and drainage in the ED, wound is packed. Packing removed prior to discharge -Blood cultures negative growth to date -Wound culture so far with gram-negative coccobacilli, gram-positive cocci in pairs. Pending final read -Tx vanco, zosyn. Deescalated to augmentin   Hypertensive urgency -Start HCTZ, amlodipine, lisinopril  -Improved BP  Uncontrolled diabetes with hyperglycemia -Ha1c 10.4 -Appreciate diabetic coordinator -Transition to Novolog 70/30 25 units BID  -Outpatient diabetic ed referral  Elevated liver enzymes -Hepatitis panel negative -Trend LFT, improving  -RUQ [place] with hepatic steatosis -Need outpatient follow-up for LFT, consider GI evaluation as outpatient  Migraine -CT head unremarkable -Headache improved   Discharge Instructions  Discharge Instructions    Ambulatory referral to Nutrition and Diabetic Education    Complete by:  As directed    Call MD for:    Complete by:  As directed    Worsening chest wall abscess   Call MD for:  difficulty breathing, headache or visual disturbances    Complete by:  As directed    Call MD for:  extreme fatigue    Complete by:  As directed    Call MD for:  hives    Complete by:  As directed    Call MD for:  persistant dizziness or light-headedness    Complete by:  As directed    Call MD for:  persistant nausea and vomiting    Complete by:  As directed    Call MD for:  severe uncontrolled pain    Complete by:  As directed    Call MD for:  temperature >100.4    Complete by:  As directed    Diet Carb Modified    Complete  by:  As directed    Discharge instructions    Complete by:  As directed    You were cared for by a hospitalist during your hospital stay. If you have any questions about your discharge medications or the care you received while you were in the hospital after you are  discharged, you can call the unit and asked to speak with the hospitalist on call if the hospitalist that took care of you is not available. Once you are discharged, your primary care physician will handle any further medical issues. Please note that NO REFILLS for any discharge medications will be authorized once you are discharged, as it is imperative that you return to your primary care physician (or establish a relationship with a primary care physician if you do not have one) for your aftercare needs so that they can reassess your need for medications and monitor your lab values.   Discharge wound care:    Complete by:  As directed    Keep chest abscess clean and dry. Do not soak.   Increase activity slowly    Complete by:  As directed      Allergies as of 06/18/2017   No Known Allergies     Medication List    TAKE these medications   amLODipine 10 MG tablet Commonly known as:  NORVASC Take 1 tablet (10 mg total) by mouth daily.   amoxicillin-clavulanate 875-125 MG tablet Commonly known as:  AUGMENTIN Take 1 tablet by mouth every 12 (twelve) hours.   blood glucose meter kit and supplies Dispense based on patient and insurance preference. Use up to four times daily as directed. (FOR ICD-9 250.00, 250.01).   hydrochlorothiazide 25 MG tablet Commonly known as:  HYDRODIURIL Take 1 tablet (25 mg total) by mouth daily.   insulin aspart protamine- aspart (70-30) 100 UNIT/ML injection Commonly known as:  NOVOLOG MIX 70/30 Inject 0.25 mLs (25 Units total) into the skin 2 (two) times daily with a meal.   INSULIN SYRINGE .3CC/31GX5/16" 31G X 5/16" 0.3 ML Misc Use with insulin, twice daily   lisinopril 10 MG tablet Commonly known as:  PRINIVIL,ZESTRIL Take 1 tablet (10 mg total) by mouth daily.   naproxen sodium 220 MG tablet Commonly known as:  ANAPROX Take 880 mg by mouth daily as needed (mild pain).            Discharge Care Instructions        Start     Ordered    06/18/17 0000  Discharge wound care:    Comments:  Keep chest abscess clean and dry. Do not soak.   06/18/17 1030     Follow-up Information    [facility]. Schedule an appointment as soon as possible for a visit.   Why:  Call at 0900 [phone] for a hospital follow up appointment and to get a primary care doctor. Contact information: [address] [place] [address] [phone]         No Known Allergies  Consultations:  Diabetic coordinator    Procedures/Studies: Ct Head Wo Contrast  Result Date: 06/17/2017 CLINICAL DATA:  Initial evaluation for acute severe headache, worse headache of life. EXAM: CT HEAD WITHOUT CONTRAST TECHNIQUE: Contiguous axial images were obtained from the base of the skull through the vertex without intravenous contrast. COMPARISON:  None available. FINDINGS: Brain: Cerebral volume within normal limits for patient age. No evidence for acute intracranial hemorrhage. No findings to suggest acute large vessel  territory infarct. No mass lesion, midline shift, or mass effect. Ventricles are normal in size without evidence for hydrocephalus. No extra-axial fluid collection identified. Vascular: No hyperdense vessel identified. Skull: Scalp soft tissues demonstrate no acute abnormality.Calvarium intact. Sinuses/Orbits: Globes and orbital soft tissues are within normal limits. Visualized paranasal sinuses are clear. No mastoid effusion. IMPRESSION: Normal head CT.  No acute intracranial abnormality identified. Electronically Signed   By: Jeannine Boga M.D.   On: 06/17/2017 18:58   US Abdomen Limited Ruq  Result Date: 06/18/2017 CLINICAL DATA:  Elevated liver enzymes EXAM: ULTRASOUND ABDOMEN LIMITED RIGHT UPPER QUADRANT COMPARISON:  CT abdomen and pelvis January 01, 2016 FINDINGS: Gallbladder: Surgically absent. Common bile duct: Diameter: 13 mm proximally, dilated. No intrahepatic biliary duct dilatation is  noted. There is a linear echogenic focus in the common bile duct, a likely stent. Liver: No focal lesion identified. Echogenicity of the liver is overall increased. Portal vein is patent on color Doppler imaging with normal direction of blood flow towards the liver. IMPRESSION: 1.   Gallbladder absent. 2. Dilatation of the proximal common bile duct with probable stent within the biliary ductal system. No well-defined mass or calculus is seen in the biliary ductal system. Note that portions of the distal common bile duct are not well seen due to gas. 3. Increased liver echogenicity, a finding most likely indicative of a degree of hepatic steatosis. While no focal liver lesions are evident on this study, it must be cautioned that the sensitivity of ultrasound for detection of focal liver lesions is somewhat diminished in this circumstance. Electronically Signed   By: Lowella Grip III M.D.   On: 06/18/2017 09:09       Discharge Exam: Vitals:   06/17/17 2135 06/18/17 0530  BP: (!) 157/90 139/87  Pulse: 75 73  Resp: 18 16  Temp: 98.4 F (36.9 C) 98 F (36.7 C)  SpO2: 98% 99%   Vitals:   06/17/17 0628 06/17/17 1322 06/17/17 2135 06/18/17 0530  BP: (!) 158/88 (!) 169/98 (!) 157/90 139/87  Pulse: 73 83 75 73  Resp: '16 16 18 16  '$ Temp: 98.1 F (36.7 C) 98.3 F (36.8 C) 98.4 F (36.9 C) 98 F (36.7 C)  TempSrc: Oral Oral Oral Oral  SpO2: 100% 100% 98% 99%  Weight:      Height:        General: Pt is alert, awake, not in acute distress Cardiovascular: RRR, S1/S2 +, no rubs, no gallops Respiratory: CTA bilaterally, no wheezing, no rhonchi Abdominal: Soft, NT, ND, bowel sounds + Extremities: no edema, no cyanosis    The results of significant diagnostics from this hospitalization (including imaging, microbiology, ancillary and laboratory) are listed below for reference.     Microbiology: Recent Results (from the past 240 hour(s))  Wound or Superficial Culture     Status: None  (Preliminary result)   Collection Time: 06/14/17 11:00 PM  Result Value Ref Range Status   Specimen Description CHEST  Final   Special Requests NONE  Final   Gram Stain   Final    FEW WBC PRESENT, PREDOMINANTLY PMN ABUNDANT GRAM NEGATIVE COCCOBACILLI ABUNDANT GRAM POSITIVE COCCI IN PAIRS    Culture   Final    CULTURE REINCUBATED FOR BETTER GROWTH Performed at [facility], [place] [address] [place],  [address]    Report Status PENDING  Incomplete  Culture, blood (routine x 2)     Status: None (Preliminary result)   Collection Time: 06/15/17  1:49  PM  Result Value Ref Range Status   Specimen Description BLOOD LEFT HAND  Final   Special Requests IN PEDIATRIC BOTTLE Blood Culture adequate volume  Final   Culture   Final    NO GROWTH 3 DAYS Performed at [facility], [address], [place], [place] [address]    Report Status PENDING  Incomplete  Culture, blood (routine x 2)     Status: None (Preliminary result)   Collection Time: 06/15/17  1:55 PM  Result Value Ref Range Status   Specimen Description BLOOD RIGHT HAND  Final   Special Requests IN PEDIATRIC BOTTLE Blood Culture adequate volume  Final   Culture   Final    NO GROWTH 3 DAYS Performed at [facility], [place] [address], [place], [place] [address]    Report Status PENDING  Incomplete     Labs: BNP (last 3 results) No results for input(s): BNP in the last 8760 hours. Basic Metabolic Panel:  Recent Labs Lab 06/14/17 2259 06/15/17 1328 06/16/17 0527 06/17/17 0713 06/18/17 0627  NA 132*  --  138 137 137  K 3.4*  --  4.0 3.7 3.6  CL 101  --  103 102 102  CO2 24  --  '28 27 25  '$ GLUCOSE 367*  --  284* 287* 203*  BUN 8  --  '6 8 9  '$ CREATININE 0.58  --  0.49 0.56 0.56  CALCIUM 8.6*  --  8.9 9.1 9.3  MG  --  1.9  --   --   --    Liver Function Tests:  Recent Labs Lab 06/16/17 0527 06/17/17 0713 06/18/17 0627  AST 281* 404* 165*  ALT 204* 340* 329*  ALKPHOS 398* 379* 423*  BILITOT 1.0  2.0* 0.8  PROT 7.9 7.5 8.1  ALBUMIN 3.3* 3.0* 3.3*    Recent Labs Lab 06/17/17 0713  LIPASE 28   No results for input(s): AMMONIA in the last 168 hours. CBC:  Recent Labs Lab 06/14/17 2259 06/16/17 0527 06/17/17 0713 06/18/17 0627  WBC 13.3* 10.2 9.7 11.7*  NEUTROABS 8.4*  --  5.8 6.7  HGB 11.0* 12.0 12.2 13.5  HCT 34.0* 36.8 37.8 41.5  MCV 82.3 80.5 81.3 81.1  PLT 313 367 362 406*   Cardiac Enzymes: No results for input(s): CKTOTAL, CKMB, CKMBINDEX, TROPONINI in the last 168 hours. BNP: Invalid input(s): POCBNP CBG:  Recent Labs Lab 06/17/17 1140 06/17/17 1645 06/17/17 2218 06/18/17 0803 06/18/17 1145  GLUCAP 307* 241* 192* 191* 352*   D-Dimer No results for input(s): DDIMER in the last 72 hours. Hgb A1c No results for input(s): HGBA1C in the last 72 hours. Lipid Profile No results for input(s): CHOL, HDL, LDLCALC, TRIG, CHOLHDL, LDLDIRECT in the last 72 hours. Thyroid function studies No results for input(s): TSH, T4TOTAL, T3FREE, THYROIDAB in the last 72 hours.  Invalid input(s): FREET3 Anemia work up No results for input(s): VITAMINB12, FOLATE, FERRITIN, TIBC, IRON, RETICCTPCT in the last 72 hours. Urinalysis    Component Value Date/Time   COLORURINE YELLOW 06/14/2017 2350   APPEARANCEUR CLEAR 06/14/2017 2350   LABSPEC <1.005 (L) 06/14/2017 2350   PHURINE 7.5 06/14/2017 2350   GLUCOSEU >=500 (A) 06/14/2017 2350   HGBUR NEGATIVE 06/14/2017 2350   BILIRUBINUR NEGATIVE 06/14/2017 2350   KETONESUR NEGATIVE 06/14/2017 2350   PROTEINUR NEGATIVE 06/14/2017 2350   UROBILINOGEN 1.0 01/19/2012 0015   NITRITE NEGATIVE 06/14/2017 2350   LEUKOCYTESUR NEGATIVE 06/14/2017 2350   Sepsis Labs Invalid input(s):  PROCALCITONIN,  WBC,  LACTICIDVEN Microbiology Recent Results (from the past 240 hour(s))  Wound or Superficial Culture     Status: None (Preliminary result)   Collection Time: 06/14/17 11:00 PM  Result Value Ref Range Status   Specimen Description  CHEST  Final   Special Requests NONE  Final   Gram Stain   Final    FEW WBC PRESENT, PREDOMINANTLY PMN ABUNDANT GRAM NEGATIVE COCCOBACILLI ABUNDANT GRAM POSITIVE COCCI IN PAIRS    Culture   Final    CULTURE REINCUBATED FOR BETTER GROWTH Performed at [facility], [place] [address] [place], [place] [address]    Report Status PENDING  Incomplete  Culture, blood (routine x 2)     Status: None (Preliminary result)   Collection Time: 06/15/17  1:49 PM  Result Value Ref Range Status   Specimen Description BLOOD LEFT HAND  Final   Special Requests IN PEDIATRIC BOTTLE Blood Culture adequate volume  Final   Culture   Final    NO GROWTH 3 DAYS Performed at [facility], [place] [address] [place], [place] [address]    Report Status PENDING  Incomplete  Culture, blood (routine x 2)     Status: None (Preliminary result)   Collection Time: 06/15/17  1:55 PM  Result Value Ref Range Status   Specimen Description BLOOD RIGHT HAND  Final   Special Requests IN PEDIATRIC BOTTLE Blood Culture adequate volume  Final   Culture   Final    NO GROWTH 3 DAYS Performed at [facility], [place] [address] [place], [place] [address]    Report Status PENDING  Incomplete     Time coordinating discharge: 45 minutes  SIGNED:  Dessa Phi, DO [facility] Pager [phone]  If 7PM-7AM, please contact night-coverage [email or web address] Password [email or web address] 06/18/2017, 3:12 PM

## 2017-06-18 NOTE — Discharge Instructions (Signed)

## 2017-06-18 NOTE — Care Management Note (Addendum)
Case Management Note  Patient Details  Name: Carolyn Zimmerman MRN: [id number] Date of Birth: 1986-07-25  Subjective/Objective:                  dka  Action/Plan: Date: June 18, 2017 Discharge orders review for case management needs.  None found  Per patient or family member no additional needs at home. Information for [facility] wellness clinic given to patient to make follow appointment. Marcelle Smiling, BSN, RN3, CCM:  [phone]  Expected Discharge Date:  06/18/17               Expected Discharge Plan:  Home/Self Care  In-House Referral:     Discharge planning Services  CM Consult  Post Acute Care Choice:    Choice offered to:     DME Arranged:    DME Agency:     HH Arranged:    HH Agency:     Status of Service:  Completed, signed off  If discussed at Long Length of Stay Meetings, dates discussed:    Additional Comments:  Golda Acre, RN 06/18/2017, 10:31 AM

## 2017-06-18 NOTE — Progress Notes (Addendum)
Pts IV removed with a clean and dry dressing intact. Pt denies pain at the time of discharge with no s/s of distress noted. Discussed at length with patient discharge wound care and insulin administration.  Packing to chest removed before discharged and covered with clean gause

## 2017-06-20 LAB — CULTURE, BLOOD (ROUTINE X 2)
CULTURE: NO GROWTH
Culture: NO GROWTH
Special Requests: ADEQUATE
Special Requests: ADEQUATE

## 2017-06-21 LAB — AEROBIC CULTURE W GRAM STAIN (SUPERFICIAL SPECIMEN)

## 2017-06-21 LAB — AEROBIC CULTURE  (SUPERFICIAL SPECIMEN): CULTURE: NORMAL

## 2017-12-29 ENCOUNTER — Other Ambulatory Visit: Payer: Self-pay

## 2017-12-29 ENCOUNTER — Emergency Department (HOSPITAL_BASED_OUTPATIENT_CLINIC_OR_DEPARTMENT_OTHER)
Admission: EM | Admit: 2017-12-29 | Discharge: 2017-12-30 | Disposition: A | Discharge status: Home / Self Care | Payer: Self-pay | Attending: Emergency Medicine | Admitting: Emergency Medicine

## 2017-12-29 DIAGNOSIS — F17200 Nicotine dependence, unspecified, uncomplicated: Secondary | ICD-10-CM | POA: Insufficient documentation

## 2017-12-29 DIAGNOSIS — L02213 Cutaneous abscess of chest wall: Secondary | ICD-10-CM | POA: Insufficient documentation

## 2017-12-29 DIAGNOSIS — R945 Abnormal results of liver function studies: Secondary | ICD-10-CM | POA: Insufficient documentation

## 2017-12-29 DIAGNOSIS — Z79899 Other long term (current) drug therapy: Secondary | ICD-10-CM | POA: Insufficient documentation

## 2017-12-29 DIAGNOSIS — I10 Essential (primary) hypertension: Secondary | ICD-10-CM | POA: Insufficient documentation

## 2017-12-29 DIAGNOSIS — Z9889 Other specified postprocedural states: Secondary | ICD-10-CM | POA: Insufficient documentation

## 2017-12-29 DIAGNOSIS — E119 Type 2 diabetes mellitus without complications: Secondary | ICD-10-CM | POA: Insufficient documentation

## 2017-12-29 DIAGNOSIS — R7989 Other specified abnormal findings of blood chemistry: Secondary | ICD-10-CM

## 2017-12-29 DIAGNOSIS — Z794 Long term (current) use of insulin: Secondary | ICD-10-CM | POA: Insufficient documentation

## 2017-12-29 LAB — URINALYSIS, MICROSCOPIC (REFLEX): Bacteria, UA: NONE SEEN

## 2017-12-29 LAB — URINALYSIS, ROUTINE W REFLEX MICROSCOPIC
Hgb urine dipstick: NEGATIVE
Ketones, ur: NEGATIVE mg/dL
Leukocytes, UA: NEGATIVE
Nitrite: NEGATIVE
PH: 7 (ref 5.0–8.0)
Protein, ur: NEGATIVE mg/dL

## 2017-12-29 LAB — PREGNANCY, URINE: Preg Test, Ur: NEGATIVE

## 2017-12-29 MED ORDER — SODIUM CHLORIDE 0.9 % IV BOLUS
1000.0000 mL | Freq: Once | INTRAVENOUS | Status: AC
  Administered 2017-12-30: 1000 mL via INTRAVENOUS

## 2017-12-29 NOTE — ED Provider Notes (Signed)
[facility] EMERGENCY DEPARTMENT Provider Note   CSN: [id number] Arrival date & time: 12/29/17  2306     History   Chief Complaint Chief Complaint  Patient presents with  . Wound Check  . Nausea  . Urinary Frequency    HPI Carolyn Zimmerman is a 32 y.o. female.  Patient is a 32 year old female with past medical history of hypertension and diabetes, neither of which are currently being treated.  She has been on blood pressure medication and insulin in the past, however is been off these for the past 6 months.  She was admitted approximately 6 months ago for an abscess to her chest wall.  This was drained, however has continued to drain intermittently since.  She denies any fevers or chills.  She denies any vomiting or diarrhea.  She does report intermittent nausea.  She also reports urinary frequency and states that this evening her urine looked orange.  She is also concerned that her mother told her her eyes appeared yellow.  The history is provided by the patient.    Past Medical History:  Diagnosis Date  . Diabetes mellitus   . Hypertension   . Obesity     Patient Active Problem List   Diagnosis Date Noted  . Chest wall abscess 06/15/2017  . Hyperglycemia 06/15/2017  . Abscess   . Hx of medication noncompliance     Past Surgical History:  Procedure Laterality Date  . CHOLECYSTECTOMY       OB History   None      Home Medications    Prior to Admission medications   Medication Sig Start Date End Date Taking? Authorizing Provider  amLODipine (NORVASC) 10 MG tablet Take 1 tablet (10 mg total) by mouth daily. 06/19/17   Dessa Phi, DO  blood glucose meter kit and supplies Dispense based on patient and insurance preference. Use up to four times daily as directed. (FOR ICD-9 250.00, 250.01). 06/18/17   Dessa Phi, DO  hydrochlorothiazide (HYDRODIURIL) 25 MG tablet Take 1 tablet (25 mg total) by mouth daily. 06/19/17   Dessa Phi, DO  insulin aspart  protamine- aspart (NOVOLOG MIX 70/30) (70-30) 100 UNIT/ML injection Inject 0.25 mLs (25 Units total) into the skin 2 (two) times daily with a meal. 06/18/17 07/18/17  Dessa Phi, DO  Insulin Syringe-Needle U-100 (INSULIN SYRINGE .3CC/31GX5/16") 31G X 5/16" 0.3 ML MISC Use with insulin, twice daily 06/18/17   Dessa Phi, DO  lisinopril (PRINIVIL,ZESTRIL) 10 MG tablet Take 1 tablet (10 mg total) by mouth daily. 06/19/17   Dessa Phi, DO  naproxen sodium (ANAPROX) 220 MG tablet Take 880 mg by mouth daily as needed (mild pain).    [provider]    Family History No family history on file.  Social History Social History   Tobacco Use  . Smoking status: Current Some Day Smoker  . Smokeless tobacco: Never Used  Substance Use Topics  . Alcohol use: No  . Drug use: No     Allergies   Patient has no known allergies.   Review of Systems Review of Systems  All other systems reviewed and are negative.    Physical Exam Updated Vital Signs BP (!) 172/115 (BP Location: Right Arm)   Pulse 96   Temp 98.1 F (36.7 C) (Oral)   Resp 18   Ht _0  (1.727 m)   Wt 113.4 kg (250 lb)   SpO2 100%   BMI 38.01 kg/m   Physical Exam  Constitutional: She is  oriented to person, place, and time. She appears well-developed and well-nourished. No distress.  HENT:  Head: Normocephalic and atraumatic.  Neck: Normal range of motion. Neck supple.  Cardiovascular: Normal rate and regular rhythm. Exam reveals no gallop and no friction rub.  No murmur heard. Pulmonary/Chest: Effort normal and breath sounds normal. No respiratory distress. She has no wheezes.  Abdominal: Soft. Bowel sounds are normal. She exhibits no distension. There is no tenderness.  Musculoskeletal: Normal range of motion.  Neurological: She is alert and oriented to person, place, and time.  Skin: Skin is warm and dry. She is not diaphoretic.  There is scar tissue to the upper chest between the breasts.  There is  a small area where there is a small quantity of serosanguineous fluid draining.  There is no significant redness or fluctuance.  Nursing note and vitals reviewed.    ED Treatments / Results  Labs (all labs ordered are listed, but only abnormal results are displayed) Labs Reviewed  PREGNANCY, URINE  URINALYSIS, ROUTINE W REFLEX MICROSCOPIC  COMPREHENSIVE METABOLIC PANEL  CBC WITH DIFFERENTIAL/PLATELET    EKG None  Radiology No results found.  Procedures Procedures (including critical care time)  Medications Ordered in ED Medications  sodium chloride 0.9 % bolus 1,000 mL (has no administration in time range)     Initial Impression / Assessment and Plan / ED Course  I have reviewed the triage vital signs and the nursing notes.  Pertinent labs & imaging results that were available during my care of the patient were reviewed by me and considered in my medical decision making (see chart for details).  Patient presents with multiple complaints.  Her physical examination does reveal icteric sclera with elevated bilirubin and LFTs.  CT scan was obtained to further evaluate.  This shows there to be a biliary stent in place.  She had her gallbladder removed nearly 10 years ago.  I discussed the situation with Dr. Michail Sermon from gastroenterology.  It is his opinion that if the patient does not appear toxic, she is appropriate for outpatient follow-up with general surgery.  The patient does not recall the name of her surgeon or if the surgeon is still in practice.  She is to follow-up with them on Monday.  She will also be given the contact information for [facility] surgery whom she can call and make an appointment if she is unable to follow-up at her surgeon's office.  She also has what appears to be a chronic wound to the anterior chest wall.  This will be treated with antibiotics.  Final Clinical Impressions(s) / ED Diagnoses   Final diagnoses:  None    ED Discharge Orders      None       Veryl Speak, MD 12/30/17 0086

## 2017-12-29 NOTE — ED Triage Notes (Addendum)
Pt here with multiple complaints....  1) wound drainage on chest has been draining since October. 2) nausea and decreased appetite x1 month 3) pt states her mother noticed her eyes were yellow today.  4) pt reporting "orange pee." Pt reports urinary frequency.   Pt in NAD during triage.

## 2017-12-30 ENCOUNTER — Emergency Department (HOSPITAL_BASED_OUTPATIENT_CLINIC_OR_DEPARTMENT_OTHER): Payer: Self-pay

## 2017-12-30 LAB — CBC WITH DIFFERENTIAL/PLATELET
BASOS ABS: 0 10*3/uL (ref 0.0–0.1)
BASOS PCT: 0 %
EOS PCT: 2 %
Eosinophils Absolute: 0.2 10*3/uL (ref 0.0–0.7)
HCT: 36.8 % (ref 36.0–46.0)
Hemoglobin: 13.3 g/dL (ref 12.0–15.0)
Lymphocytes Relative: 27 %
Lymphs Abs: 3.3 10*3/uL (ref 0.7–4.0)
MCH: 31.4 pg (ref 26.0–34.0)
MCHC: 36.1 g/dL — AB (ref 30.0–36.0)
MCV: 87 fL (ref 78.0–100.0)
MONO ABS: 0.9 10*3/uL (ref 0.1–1.0)
MONOS PCT: 7 %
Neutro Abs: 7.8 10*3/uL — ABNORMAL HIGH (ref 1.7–7.7)
Neutrophils Relative %: 64 %
PLATELETS: 290 10*3/uL (ref 150–400)
RBC: 4.23 MIL/uL (ref 3.87–5.11)
RDW: 15.1 % (ref 11.5–15.5)
WBC: 12.3 10*3/uL — ABNORMAL HIGH (ref 4.0–10.5)

## 2017-12-30 LAB — HEPATIC FUNCTION PANEL
ALBUMIN: 3 g/dL — AB (ref 3.5–5.0)
ALT: 169 U/L — ABNORMAL HIGH (ref 14–54)
AST: 133 U/L — AB (ref 15–41)
Alkaline Phosphatase: 884 U/L — ABNORMAL HIGH (ref 38–126)
BILIRUBIN TOTAL: 7.6 mg/dL — AB (ref 0.3–1.2)
Bilirubin, Direct: 5 mg/dL — ABNORMAL HIGH (ref 0.1–0.5)
Indirect Bilirubin: 2.6 mg/dL — ABNORMAL HIGH (ref 0.3–0.9)
TOTAL PROTEIN: 7.2 g/dL (ref 6.5–8.1)

## 2017-12-30 LAB — COMPREHENSIVE METABOLIC PANEL
ALBUMIN: 3 g/dL — AB (ref 3.5–5.0)
ALK PHOS: 882 U/L — AB (ref 38–126)
ALT: 173 U/L — AB (ref 14–54)
AST: 128 U/L — AB (ref 15–41)
Anion gap: 10 (ref 5–15)
BILIRUBIN TOTAL: 7.4 mg/dL — AB (ref 0.3–1.2)
BUN: 8 mg/dL (ref 6–20)
CALCIUM: 9 mg/dL (ref 8.9–10.3)
CO2: 23 mmol/L (ref 22–32)
Chloride: 98 mmol/L — ABNORMAL LOW (ref 101–111)
Creatinine, Ser: 0.39 mg/dL — ABNORMAL LOW (ref 0.44–1.00)
GFR calc Af Amer: >60 mL/min (ref >60)
GFR calc non Af Amer: >60 mL/min (ref >60)
Glucose, Bld: 386 mg/dL — ABNORMAL HIGH (ref 65–99)
Potassium: 3.9 mmol/L (ref 3.5–5.1)
Sodium: 131 mmol/L — ABNORMAL LOW (ref 135–145)
TOTAL PROTEIN: 7.3 g/dL (ref 6.5–8.1)

## 2017-12-30 LAB — CBG MONITORING, ED: GLUCOSE-CAPILLARY: 257 mg/dL — AB (ref 65–99)

## 2017-12-30 LAB — LIPASE, BLOOD: Lipase: 41 U/L (ref 11–51)

## 2017-12-30 MED ORDER — INSULIN REGULAR HUMAN 100 UNIT/ML IJ SOLN
6.0000 [IU] | Freq: Once | INTRAMUSCULAR | Status: AC
  Administered 2017-12-30: 6 [IU] via INTRAVENOUS
  Filled 2017-12-30: qty 1

## 2017-12-30 MED ORDER — SULFAMETHOXAZOLE-TRIMETHOPRIM 800-160 MG PO TABS: 1.0000 | ORAL_TABLET | Freq: Two times a day (BID) | ORAL | 0 refills | Status: AC

## 2017-12-30 MED ORDER — IOPAMIDOL (ISOVUE-300) INJECTION 61%
100.0000 mL | Freq: Once | INTRAVENOUS | Status: AC | PRN
  Administered 2017-12-30: 100 mL via INTRAVENOUS

## 2017-12-30 MED ORDER — DIPHENHYDRAMINE HCL 50 MG/ML IJ SOLN
25.0000 mg | Freq: Once | INTRAMUSCULAR | Status: AC
  Administered 2017-12-30: 25 mg via INTRAVENOUS
  Filled 2017-12-30: qty 1

## 2017-12-30 MED ORDER — CEPHALEXIN 500 MG PO CAPS: 500.0000 mg | ORAL_CAPSULE | Freq: Four times a day (QID) | ORAL | 0 refills | Status: DC

## 2017-12-30 NOTE — ED Notes (Signed)
Patient transported to CT 

## 2017-12-30 NOTE — Discharge Instructions (Addendum)
Keflex and Bactrim as prescribed.  You are to follow-up with your general surgeon this week.  If you are unable to make contact with your prior surgeon, [facility] has been provided for you to call and make these arrangements.  Return to the emergency department if symptoms significantly worsen or change.

## 2017-12-30 NOTE — ED Notes (Signed)
Pt states she is here for several reasons. One being her mother noticed that her eyes were yellow. Secondly, her sugar is up. Pt has not been using Insulin as instructed and has been sharing Metformin with her mother due to not having a PCP. Not taking BP meds as well for same reason.Also, she is concerned about the area where she had an abscess removed-that it is draining a little. Pt has also been nauseated for about 1-1/2 months.

## 2017-12-30 NOTE — ED Notes (Signed)
ED Provider at bedside. 

## 2017-12-30 NOTE — ED Notes (Signed)
Pt given d/c instructions as per chart. Rx x 2. Verbalizes understanding. No questions. Brother coming to pick up. Pt instructed not to drive.

## 2017-12-31 LAB — HEPATITIS PANEL, ACUTE
HCV Ab: <0.1 s/co ratio (ref 0.0–0.9)
HEP B C IGM: NEGATIVE
HEP B S AG: NEGATIVE
Hep A IgM: NEGATIVE

## 2018-07-30 DIAGNOSIS — K831 Obstruction of bile duct: Secondary | ICD-10-CM | POA: Insufficient documentation

## 2018-09-16 DIAGNOSIS — IMO0002 Reserved for concepts with insufficient information to code with codable children: Secondary | ICD-10-CM | POA: Insufficient documentation

## 2018-09-16 DIAGNOSIS — R7989 Other specified abnormal findings of blood chemistry: Secondary | ICD-10-CM | POA: Insufficient documentation

## 2018-09-16 DIAGNOSIS — E1165 Type 2 diabetes mellitus with hyperglycemia: Secondary | ICD-10-CM | POA: Insufficient documentation

## 2018-09-16 DIAGNOSIS — I10 Essential (primary) hypertension: Secondary | ICD-10-CM | POA: Diagnosis present

## 2018-09-16 DIAGNOSIS — R350 Frequency of micturition: Secondary | ICD-10-CM | POA: Insufficient documentation

## 2018-09-24 IMAGING — CT CT ABD-PELV W/ CM
2 of 5 series · 16 of 46 positions shown, 18 images · IV contrast (APPLIED)
Comparison: 01/01/2016

CLINICAL DATA: Nausea x1 month. Healing abscess in anterior mid
sternal chest wall.

EXAM:
CT ABDOMEN AND PELVIS WITH CONTRAST
TECHNIQUE: Multidetector CT imaging of the abdomen and pelvis was performed
using the standard protocol following bolus administration of
intravenous contrast.
CONTRAST:  100mL 7RH3FR-TVV IOPAMIDOL (7RH3FR-TVV) INJECTION 61%

[Series 2: axial st · axial · 0.84mm/px · z∈[+516,+961]mm · 13 of 103 slices shown, 15 images]
[im 7/103  soft-tissue]
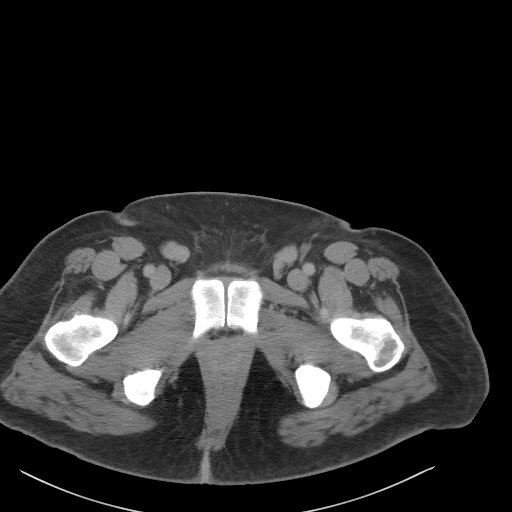
[im 7/103  bone]
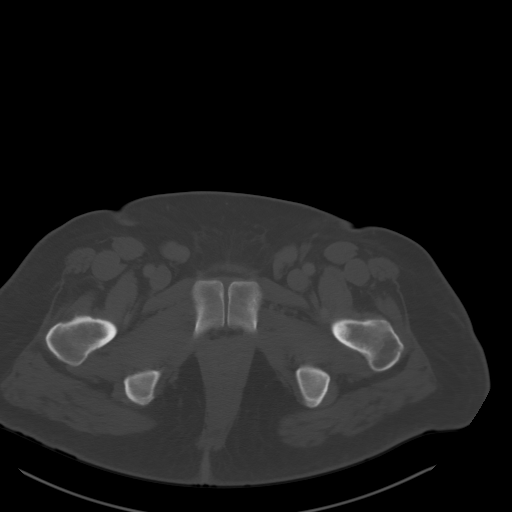
[im 13/103  soft-tissue]
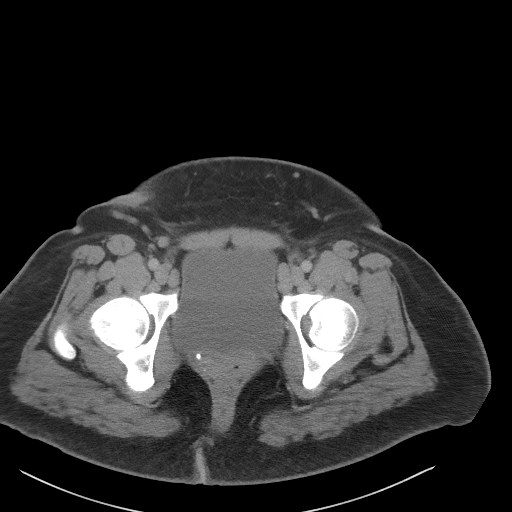
[im 20/103  soft-tissue]
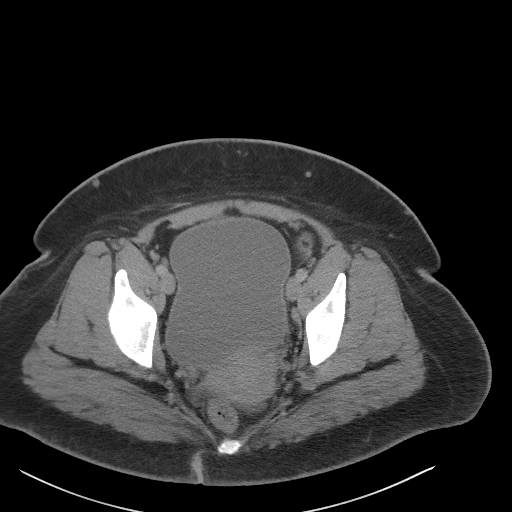
[im 32/103  soft-tissue]
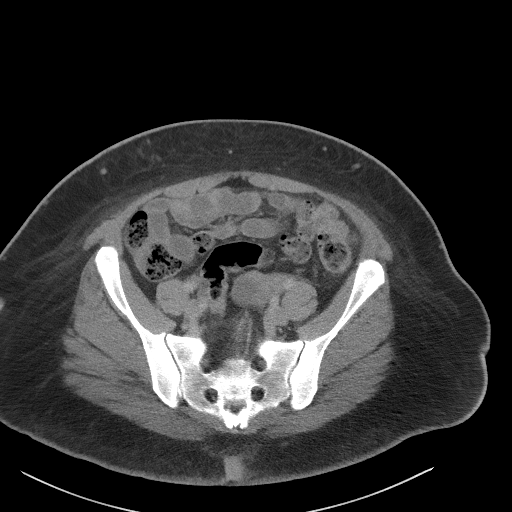
[im 39/103  soft-tissue]
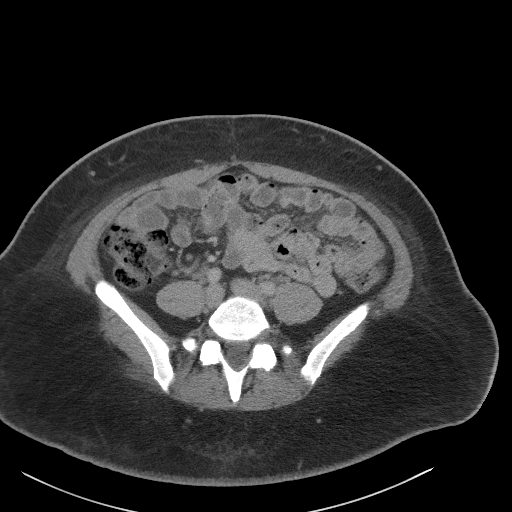
[im 45/103  soft-tissue]
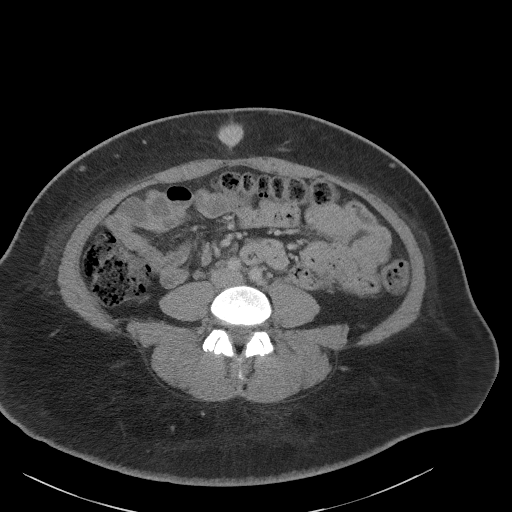
[im 52/103  soft-tissue]
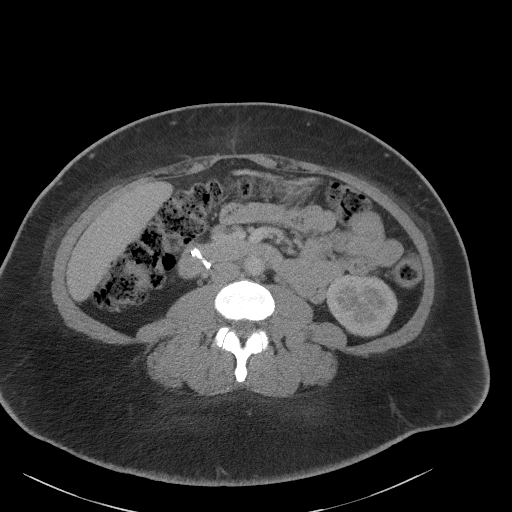
[im 58/103  soft-tissue]
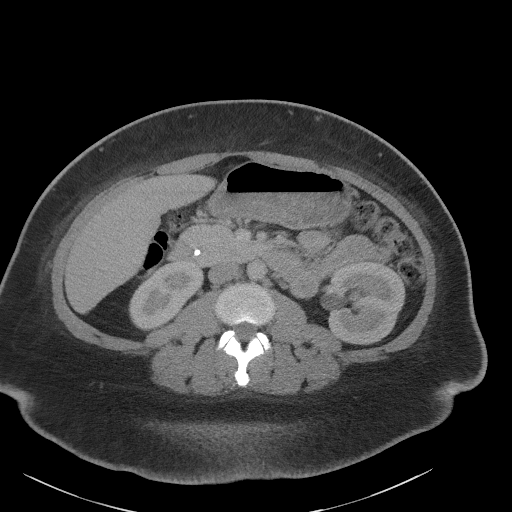
[im 64/103  soft-tissue]
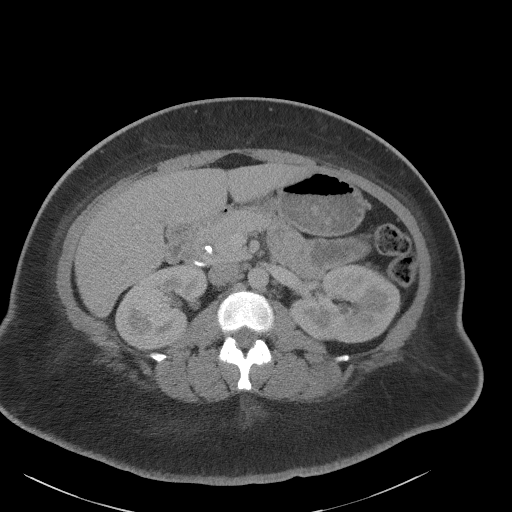
[im 64/103  bone]
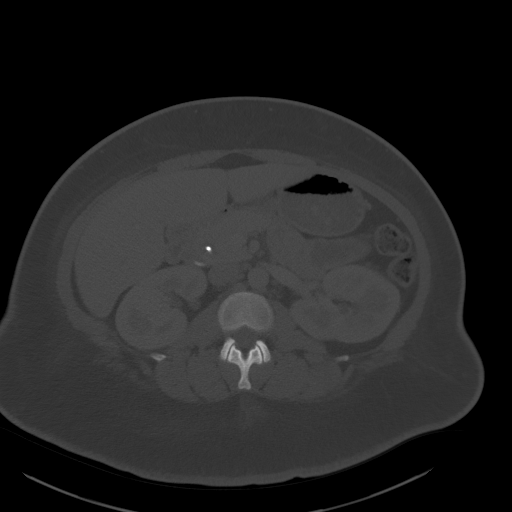
[im 71/103  soft-tissue]
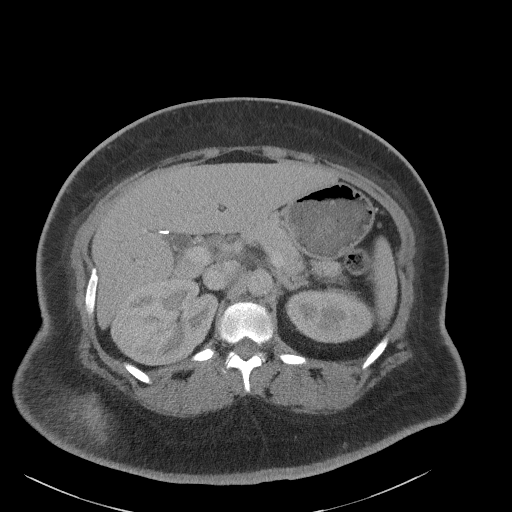
[im 83/103  soft-tissue]
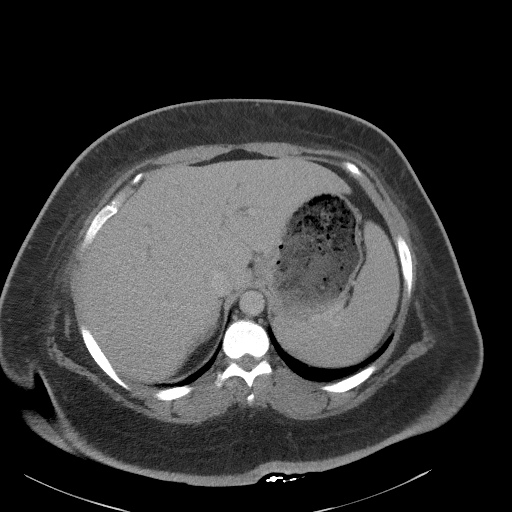
[im 90/103  soft-tissue]
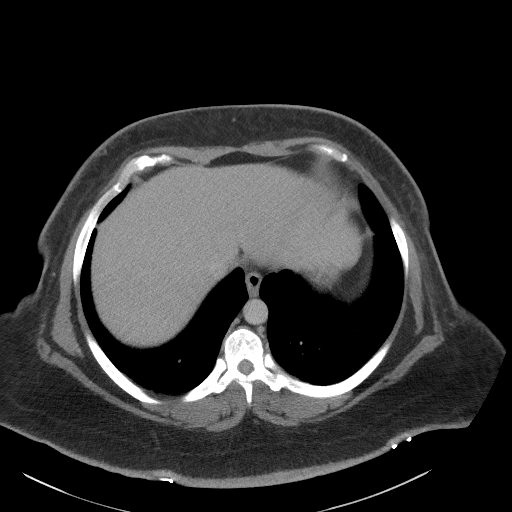
[im 96/103  soft-tissue]
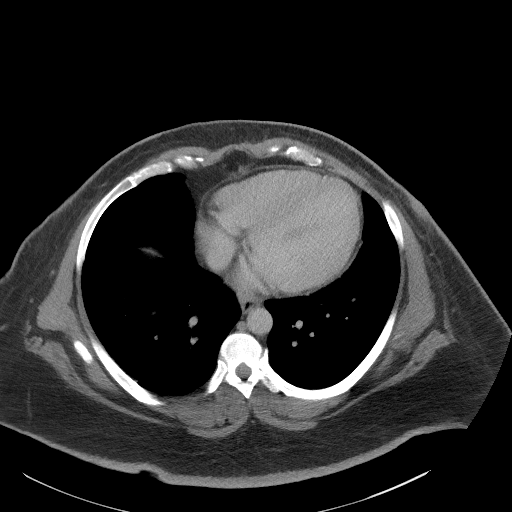

[Series 5: coronal st · coronal · 0.79mm/px · 3 of 109 slices shown]
[im 37/109  soft-tissue]
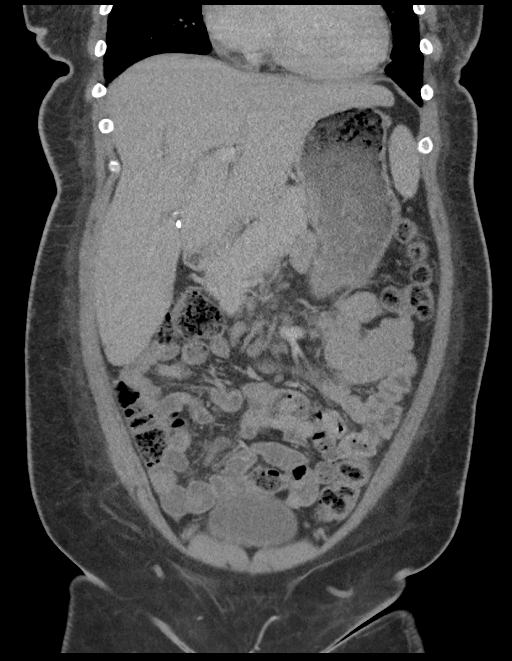
[im 49/109  soft-tissue]
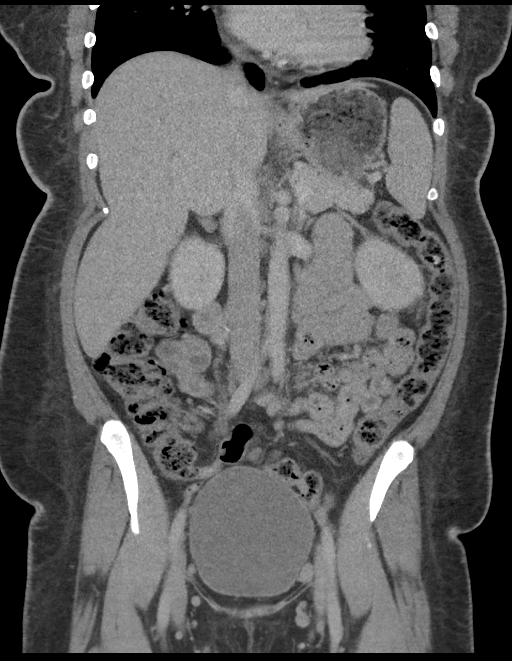
[im 61/109  soft-tissue]
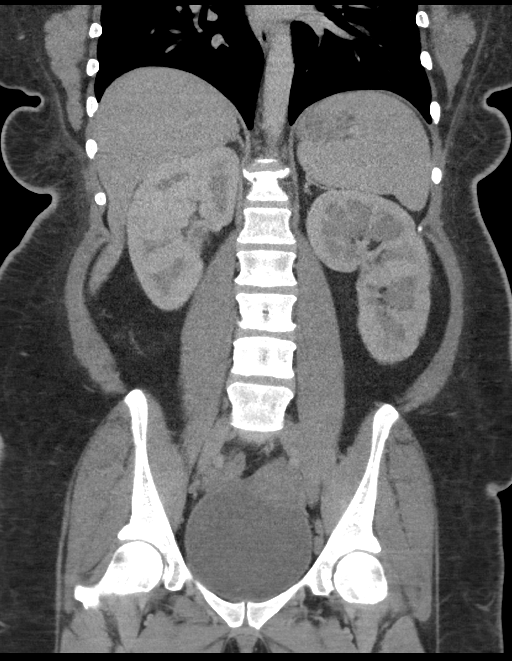

[16 of 46 positions shown; findings below may reference images not displayed]

FINDINGS: Lower chest: Mild cardiomegaly without active pulmonary disease. The
reported abscess along the mid anterior chest wall over the sternum
is not included on this abdomen and pelvic study.

Hepatobiliary: Status post cholecystectomy with mild intrahepatic
and extrahepatic ductal dilatation with biliary stent noted in the
common duct, the distal coil in the duodenum. No space-occupying
mass of the liver.

Pancreas: No ductal dilatation, inflammation or mass.

Spleen: Normal in size without focal abnormality.

Adrenals/Urinary Tract: Adrenal glands are unremarkable. Kidneys are
normal, without renal calculi, focal lesion, or hydronephrosis.
Bladder is unremarkable.

Stomach/Bowel: Stomach is within normal limits. Appendix appears
normal. No evidence of bowel wall thickening, distention, or
inflammatory changes. Moderate fecal retention within the colon.

Vascular/Lymphatic: Bilateral inguinal adenopathy, on the right
measuring 18 mm short axis and on the left 22 mm, stable in
appearance to 3185. Similarly there are several mildly enlarged
mesenteric lymph nodes as before unchanged in appearance.

Reproductive: Uterus and bilateral adnexa are unremarkable.

Other: No abdominal wall hernia or abnormality. No abdominopelvic
ascites.

Musculoskeletal: No acute or significant osseous findings.
IMPRESSION: 1. Stable mild cardiomegaly without acute pulmonary disease.
2. Biliary stent in place with chronic mild intrahepatic ductal
dilatation that may represent reservoir effect status post
cholecystectomy.
3. Stable mild mesenteric and bilateral inguinal adenopathy.
4. Moderate fecal retention within the colon. No acute bowel
obstruction or inflammation. Normal appendix.

## 2018-10-09 ENCOUNTER — Emergency Department (HOSPITAL_BASED_OUTPATIENT_CLINIC_OR_DEPARTMENT_OTHER)
Admission: EM | Admit: 2018-10-09 | Discharge: 2018-10-09 | Disposition: A | Discharge status: Home / Self Care | Payer: PRIVATE HEALTH INSURANCE | Attending: Emergency Medicine | Admitting: Emergency Medicine

## 2018-10-09 ENCOUNTER — Emergency Department (HOSPITAL_BASED_OUTPATIENT_CLINIC_OR_DEPARTMENT_OTHER): Payer: PRIVATE HEALTH INSURANCE

## 2018-10-09 ENCOUNTER — Other Ambulatory Visit: Payer: Self-pay

## 2018-10-09 ENCOUNTER — Encounter (HOSPITAL_BASED_OUTPATIENT_CLINIC_OR_DEPARTMENT_OTHER): Payer: Self-pay

## 2018-10-09 DIAGNOSIS — Z79899 Other long term (current) drug therapy: Secondary | ICD-10-CM | POA: Insufficient documentation

## 2018-10-09 DIAGNOSIS — Z794 Long term (current) use of insulin: Secondary | ICD-10-CM | POA: Insufficient documentation

## 2018-10-09 DIAGNOSIS — R0789 Other chest pain: Secondary | ICD-10-CM | POA: Diagnosis not present

## 2018-10-09 DIAGNOSIS — R0981 Nasal congestion: Secondary | ICD-10-CM | POA: Diagnosis not present

## 2018-10-09 DIAGNOSIS — R05 Cough: Secondary | ICD-10-CM

## 2018-10-09 DIAGNOSIS — I10 Essential (primary) hypertension: Secondary | ICD-10-CM | POA: Insufficient documentation

## 2018-10-09 DIAGNOSIS — R509 Fever, unspecified: Secondary | ICD-10-CM | POA: Insufficient documentation

## 2018-10-09 DIAGNOSIS — R059 Cough, unspecified: Secondary | ICD-10-CM

## 2018-10-09 DIAGNOSIS — R0602 Shortness of breath: Secondary | ICD-10-CM | POA: Diagnosis not present

## 2018-10-09 DIAGNOSIS — E119 Type 2 diabetes mellitus without complications: Secondary | ICD-10-CM | POA: Diagnosis not present

## 2018-10-09 DIAGNOSIS — F17210 Nicotine dependence, cigarettes, uncomplicated: Secondary | ICD-10-CM | POA: Insufficient documentation

## 2018-10-09 HISTORY — DX: Unspecified jaundice: R17

## 2018-10-09 MED ORDER — ALBUTEROL SULFATE HFA 108 (90 BASE) MCG/ACT IN AERS: 2.0000 | INHALATION_SPRAY | RESPIRATORY_TRACT | 0 refills | Status: DC | PRN

## 2018-10-09 MED ORDER — PREDNISONE 10 MG (21) PO TBPK: ORAL_TABLET | ORAL | 0 refills | Status: DC

## 2018-10-09 MED ORDER — IBUPROFEN 800 MG PO TABS
800.0000 mg | ORAL_TABLET | Freq: Once | ORAL | Status: AC
  Administered 2018-10-09: 800 mg via ORAL
  Filled 2018-10-09: qty 1

## 2018-10-09 MED ORDER — BENZONATATE 100 MG PO CAPS: 100.0000 mg | ORAL_CAPSULE | Freq: Three times a day (TID) | ORAL | 0 refills | Status: DC

## 2018-10-09 NOTE — Discharge Instructions (Addendum)
There was no evidence of pneumonia on the chest x-ray.  Your symptoms are likely consistent with a viral illness. Viruses do not require or respond to antibiotics. Treatment is symptomatic care and it is important to note that these symptoms may last for 7-14 days.   Hand washing: Wash your hands throughout the day, but especially before and after touching the face, using the restroom, sneezing, coughing, or touching surfaces that have been coughed or sneezed upon. Hydration: Symptoms will be intensified and complicated by dehydration. Dehydration can also extend the duration of symptoms. Drink plenty of fluids and get plenty of rest. You should be drinking at least half a liter of water an hour to stay hydrated. Electrolyte drinks (ex. Gatorade, Powerade, Pedialyte) are also encouraged. You should be drinking enough fluids to make your urine light yellow, almost clear. If this is not the case, you are not drinking enough water. Please note that some of the treatments indicated below will not be effective if you are not adequately hydrated. Diet: Please concentrate on hydration, however, you may introduce food slowly.  Start with a clear liquid diet, progressed to a full liquid diet, and then bland solids as you are able. Pain or fever: Ibuprofen, Naproxen, or acetaminophen (generic for Tylenol) for pain or fever.  Antiinflammatory medications: Take 600 mg of ibuprofen every 6 hours or 440 mg (over the counter dose) to 500 mg (prescription dose) of naproxen every 12 hours for the next 3 days. After this time, these medications may be used as needed for pain. Take these medications with food to avoid upset stomach. Choose only one of these medications, do not take them together. Acetaminophen (generic for Tylenol): Should you continue to have additional pain while taking the ibuprofen or naproxen, you may add in acetaminophen as needed. Your daily total maximum amount of acetaminophen from all sources should  be limited to 4000mg /day for persons without liver problems, or 2000mg /day for those with liver problems. Cough: Use the benzonatate (generic for Tessalon) for cough.  Albuterol: May use the albuterol as needed for instances of shortness of breath. Prednisone: Take the prednisone, as directed, in its entirety. Zyrtec or Claritin: May add these medication daily to control underlying symptoms of congestion, sneezing, and other signs of allergies.  These medications are available over-the-counter. Generics: Cetirizine (generic for Zyrtec) and loratadine (generic for Claritin). Fluticasone: Use fluticasone (generic for Flonase), as directed, for nasal and sinus congestion.  This medication is available over-the-counter. Congestion: Plain guaifenesin (generic for plain Mucinex) may help relieve congestion. Saline sinus rinses and saline nasal sprays may also help relieve congestion.  Sore throat: Warm liquids or Chloraseptic spray may help soothe a sore throat. Gargle twice a day with a salt water solution made from a half teaspoon of salt in a cup of warm water.  Follow up: Follow up with a primary care provider within the next two weeks should symptoms fail to resolve. Return: Return to the ED for significantly worsening symptoms, worsening shortness of breath, persistent vomiting, large amounts of blood in stool, or any other major concerns.  For prescription assistance, may try using prescription discount sites or apps, such as goodrx.com

## 2018-10-09 NOTE — ED Provider Notes (Signed)
[facility] EMERGENCY DEPARTMENT Provider Note   CSN: [id number] Arrival date & time: 10/09/18  0923     History   Chief Complaint Chief Complaint  Patient presents with  . Cough    HPI Carolyn Zimmerman is a 33 y.o. female.  HPI   Carolyn Zimmerman is a 33 y.o. female, with a history of DM and HTN, presenting to the ED with cough for the past 5 days.  Accompanied by fever and nasal congestion.  Has been taking DayQuil.  Occasional shortness of breath.  Chest soreness with coughing. Denies syncope, N/V/D, abdominal pain, or any other complaints.   Past Medical History:  Diagnosis Date  . Diabetes mellitus   . Hypertension   . Jaundice   . Obesity     Patient Active Problem List   Diagnosis Date Noted  . Chest wall abscess 06/15/2017  . Hyperglycemia 06/15/2017  . Abscess   . Hx of medication noncompliance     Past Surgical History:  Procedure Laterality Date  . CHOLECYSTECTOMY    . liver stent       OB History   No obstetric history on file.      Home Medications    Prior to Admission medications   Medication Sig Start Date End Date Taking? Authorizing Provider  albuterol (PROVENTIL HFA;VENTOLIN HFA) 108 (90 Base) MCG/ACT inhaler Inhale 2 puffs into the lungs every 4 (four) hours as needed for wheezing or shortness of breath. 10/09/18   Dyna Figuereo C, PA-C  amLODipine (NORVASC) 10 MG tablet Take 1 tablet (10 mg total) by mouth daily. 06/19/17   Dessa Phi, DO  benzonatate (TESSALON) 100 MG capsule Take 1 capsule (100 mg total) by mouth every 8 (eight) hours. 10/09/18   Ainslee Sou C, PA-C  blood glucose meter kit and supplies Dispense based on patient and insurance preference. Use up to four times daily as directed. (FOR ICD-9 250.00, 250.01). 06/18/17   Dessa Phi, DO  cephALEXin (KEFLEX) 500 MG capsule Take 1 capsule (500 mg total) by mouth 4 (four) times daily. 12/30/17   Veryl Speak, MD  hydrochlorothiazide (HYDRODIURIL) 25 MG tablet Take 1 tablet  (25 mg total) by mouth daily. 06/19/17   Dessa Phi, DO  insulin aspart protamine- aspart (NOVOLOG MIX 70/30) (70-30) 100 UNIT/ML injection Inject 0.25 mLs (25 Units total) into the skin 2 (two) times daily with a meal. 06/18/17 07/18/17  Dessa Phi, DO  Insulin Syringe-Needle U-100 (INSULIN SYRINGE .3CC/31GX5/16") 31G X 5/16" 0.3 ML MISC Use with insulin, twice daily 06/18/17   Dessa Phi, DO  lisinopril (PRINIVIL,ZESTRIL) 10 MG tablet Take 1 tablet (10 mg total) by mouth daily. 06/19/17   Dessa Phi, DO  METFORMIN HCL PO Take 1,500 mg by mouth daily.    [provider]  naproxen sodium (ANAPROX) 220 MG tablet Take 880 mg by mouth daily as needed (mild pain).    [provider]  predniSONE (STERAPRED UNI-PAK 21 TAB) 10 MG (21) TBPK tablet Take 6 tabs (36m) day 1, 5 tabs (573m day 2, 4 tabs (4011mday 3, 3 tabs (21m16may 4, 2 tabs (20mg5my 5, and 1 tab (10mg)71m 6. 10/09/18   Inocencia Murtaugh Helane Gunther    Family History No family history on file.  Social History Social History   Tobacco Use  . Smoking status: Current Some Day Smoker    Types: Cigarettes  . Smokeless tobacco: Never Used  Substance Use Topics  . Alcohol use: No  .  Drug use: No     Allergies   Contrast media [iodinated diagnostic agents]   Review of Systems Review of Systems  Constitutional: Positive for fever.  HENT: Positive for congestion.   Respiratory: Positive for cough and shortness of breath (occasional).   Cardiovascular: Negative for chest pain and leg swelling.  Gastrointestinal: Negative for abdominal pain, diarrhea, nausea and vomiting.  Neurological: Negative for dizziness, weakness and numbness.  All other systems reviewed and are negative.    Physical Exam Updated Vital Signs BP (!) 164/102 (BP Location: Right Arm)   Pulse (!) 108   Temp (!) 103 F (39.4 C) (Oral)   Resp 20   Ht 5' 7.5" (1.715 m)   Wt 120.2 kg   LMP 09/18/2018   SpO2 100%   BMI 40.89 kg/m    Physical Exam Vitals signs and nursing note reviewed.  Constitutional:      General: She is not in acute distress.    Appearance: She is well-developed. She is obese. She is not diaphoretic.  HENT:     Head: Normocephalic and atraumatic.     Right Ear: Tympanic membrane, ear canal and external ear normal.     Left Ear: Tympanic membrane, ear canal and external ear normal.     Nose: Mucosal edema and congestion present.     Mouth/Throat:     Mouth: Mucous membranes are moist.     Pharynx: Oropharynx is clear.  Eyes:     Conjunctiva/sclera: Conjunctivae normal.  Neck:     Musculoskeletal: Neck supple.  Cardiovascular:     Rate and Rhythm: Normal rate and regular rhythm.     Pulses: Normal pulses.     Heart sounds: Normal heart sounds.  Pulmonary:     Effort: Pulmonary effort is normal. No respiratory distress.     Breath sounds: Normal breath sounds.     Comments: No increased work of breathing.  Speaks in full sentences without difficulty. Abdominal:     Palpations: Abdomen is soft.     Tenderness: There is no abdominal tenderness. There is no guarding.  Musculoskeletal:     Right lower leg: No edema.     Left lower leg: No edema.  Lymphadenopathy:     Cervical: No cervical adenopathy.  Skin:    General: Skin is warm and dry.  Neurological:     Mental Status: She is alert.  Psychiatric:        Mood and Affect: Mood and affect normal.        Speech: Speech normal.        Behavior: Behavior normal.      ED Treatments / Results  Labs (all labs ordered are listed, but only abnormal results are displayed) Labs Reviewed - No data to display  EKG None  Radiology Dg Chest 2 View  Result Date: 10/09/2018 CLINICAL DATA:  Worsening productive cough with fever, shortness of breath and leg swelling. EXAM: CHEST - 2 VIEW COMPARISON:  Radiographs 08/28/2018 and 01/13/2017. FINDINGS: The heart is enlarged but stable. There is new linear opacity in the lingula consistent  with atelectasis. No edema, confluent airspace opacity, pleural effusion or pneumothorax identified. The bones appear unremarkable. Postsurgical changes are noted in the right upper quadrant of the abdomen. IMPRESSION: Linear atelectasis in the lingula without evidence of pneumonia. Stable cardiomegaly. Electronically Signed   By: Richardean Sale M.D.   On: 10/09/2018 21:08    Procedures Procedures (including critical care time)  Medications Ordered in ED Medications  ibuprofen (ADVIL,MOTRIN) tablet 800 mg (800 mg Oral Given 10/09/18 1854)     Initial Impression / Assessment and Plan / ED Course  I have reviewed the triage vital signs and the nursing notes.  Pertinent labs & imaging results that were available during my care of the patient were reviewed by me and considered in my medical decision making (see chart for details).     Patient presents with cough, congestion, and fever.  Influenza is a possibility.  Out of the window for Tamiflu.  No findings on chest x-ray to suggest pneumonia. Nontoxic-appearing.  Tachycardia resolved with fever management.  The patient was given instructions for home care as well as return precautions. Patient voices understanding of these instructions, accepts the plan, and is comfortable with discharge.  Vitals:   10/09/18 1843 10/09/18 2008 10/09/18 2142  BP: (!) 164/102 (!) 181/107 (!) 141/88  Pulse: (!) 108 99 88  Resp: _0 Temp: (!) 103 F (39.4 C) (!) 102.7 F (39.3 C) 99.3 F (37.4 C)  TempSrc: Oral Oral Oral  SpO2: 100% 98% 100%  Weight: 120.2 kg    Height: 5' 7.5" (1.715 m)      Patient's hypertension is noted.  She does not seem to be symptomatic to it at this time.  She was encouraged to continue to take her home medications and follow-up with a primary care provider.   Final Clinical Impressions(s) / ED Diagnoses   Final diagnoses:  Cough  Fever, unspecified fever cause    ED Discharge Orders         Ordered     benzonatate (TESSALON) 100 MG capsule  Every 8 hours     10/09/18 2137    albuterol (PROVENTIL HFA;VENTOLIN HFA) 108 (90 Base) MCG/ACT inhaler  Every 4 hours PRN     10/09/18 2137    predniSONE (STERAPRED UNI-PAK 21 TAB) 10 MG (21) TBPK tablet     10/09/18 2137           Lorayne Bender, PA-C 10/10/18 0047    Gareth Morgan, MD 10/11/18 1942

## 2018-10-09 NOTE — ED Triage Notes (Signed)
C/o flu like sx x 5 days-NAD-to triage in w/c-pt states she walked into ED-NAD

## 2018-12-20 ENCOUNTER — Encounter (HOSPITAL_BASED_OUTPATIENT_CLINIC_OR_DEPARTMENT_OTHER): Payer: Self-pay

## 2018-12-20 ENCOUNTER — Other Ambulatory Visit: Payer: Self-pay

## 2018-12-20 ENCOUNTER — Emergency Department (HOSPITAL_BASED_OUTPATIENT_CLINIC_OR_DEPARTMENT_OTHER)
Admission: EM | Admit: 2018-12-20 | Discharge: 2018-12-20 | Disposition: A | Discharge status: Home / Self Care | Payer: PRIVATE HEALTH INSURANCE | Attending: Emergency Medicine | Admitting: Emergency Medicine

## 2018-12-20 DIAGNOSIS — Z794 Long term (current) use of insulin: Secondary | ICD-10-CM | POA: Insufficient documentation

## 2018-12-20 DIAGNOSIS — B3731 Acute candidiasis of vulva and vagina: Secondary | ICD-10-CM

## 2018-12-20 DIAGNOSIS — A5901 Trichomonal vulvovaginitis: Secondary | ICD-10-CM | POA: Insufficient documentation

## 2018-12-20 DIAGNOSIS — Z79899 Other long term (current) drug therapy: Secondary | ICD-10-CM | POA: Insufficient documentation

## 2018-12-20 DIAGNOSIS — B373 Candidiasis of vulva and vagina: Secondary | ICD-10-CM | POA: Insufficient documentation

## 2018-12-20 DIAGNOSIS — A599 Trichomoniasis, unspecified: Secondary | ICD-10-CM

## 2018-12-20 DIAGNOSIS — I10 Essential (primary) hypertension: Secondary | ICD-10-CM | POA: Insufficient documentation

## 2018-12-20 DIAGNOSIS — F17210 Nicotine dependence, cigarettes, uncomplicated: Secondary | ICD-10-CM | POA: Insufficient documentation

## 2018-12-20 DIAGNOSIS — E119 Type 2 diabetes mellitus without complications: Secondary | ICD-10-CM | POA: Insufficient documentation

## 2018-12-20 LAB — URINALYSIS, ROUTINE W REFLEX MICROSCOPIC
Bilirubin Urine: NEGATIVE
Glucose, UA: >=500 mg/dL — AB
Ketones, ur: NEGATIVE mg/dL
Nitrite: NEGATIVE
Protein, ur: NEGATIVE mg/dL
Specific Gravity, Urine: 1.015 (ref 1.005–1.030)
pH: 6.5 (ref 5.0–8.0)

## 2018-12-20 LAB — WET PREP, GENITAL
Sperm: NONE SEEN
Yeast Wet Prep HPF POC: NONE SEEN

## 2018-12-20 LAB — URINALYSIS, MICROSCOPIC (REFLEX)

## 2018-12-20 LAB — PREGNANCY, URINE: Preg Test, Ur: NEGATIVE

## 2018-12-20 MED ORDER — FLUCONAZOLE 50 MG PO TABS
150.0000 mg | ORAL_TABLET | Freq: Once | ORAL | Status: AC
  Administered 2018-12-20: 150 mg via ORAL
  Filled 2018-12-20: qty 1

## 2018-12-20 MED ORDER — FLUCONAZOLE 150 MG PO TABS: 150.0000 mg | ORAL_TABLET | Freq: Every day | ORAL | 0 refills | Status: DC

## 2018-12-20 MED ORDER — LIDOCAINE 4 % EX CREA
TOPICAL_CREAM | Freq: Once | CUTANEOUS | Status: AC
  Administered 2018-12-20: 1 via TOPICAL
  Filled 2018-12-20: qty 5

## 2018-12-20 MED ORDER — AZITHROMYCIN 250 MG PO TABS: 1000.0000 mg | ORAL_TABLET | Freq: Once | ORAL | Status: DC

## 2018-12-20 MED ORDER — AZITHROMYCIN 1 G PO PACK
1.0000 g | PACK | Freq: Once | ORAL | Status: AC
  Administered 2018-12-20: 1 g via ORAL
  Filled 2018-12-20: qty 1

## 2018-12-20 MED ORDER — CEFTRIAXONE SODIUM 250 MG IJ SOLR
250.0000 mg | Freq: Once | INTRAMUSCULAR | Status: AC
  Administered 2018-12-20: 05:00:00 250 mg via INTRAMUSCULAR
  Filled 2018-12-20: qty 250

## 2018-12-20 MED ORDER — METRONIDAZOLE 500 MG PO TABS
2000.0000 mg | ORAL_TABLET | Freq: Once | ORAL | Status: AC
  Administered 2018-12-20: 05:00:00 2000 mg via ORAL
  Filled 2018-12-20: qty 4

## 2018-12-20 NOTE — ED Triage Notes (Signed)
Pt reports vaginal irritation since last week. No difficulty urinating. Sts "it hurts when I wipe." No discharge

## 2018-12-20 NOTE — ED Provider Notes (Signed)
[facility] EMERGENCY DEPARTMENT Provider Note   CSN: [id number] Arrival date & time: 12/20/18  0342    History   Chief Complaint Chief Complaint  Patient presents with  . Vaginal Pain    HPI Carolyn Zimmerman is a 33 y.o. female.     Patient presents to the emergency department for evaluation of vaginal irritation and pain.  Symptoms present for 1 week.  She reports that she has noticed that the outside portion of her vagina is very tender, it hurts when she wipes after she urinates.  She does not have any dysuria or urinary frequency.  Patient reports that she has not had intercourse in approximately 6 months.  She has no history of herpes or other STD.     Past Medical History:  Diagnosis Date  . Diabetes mellitus   . Hypertension   . Jaundice   . Obesity     Patient Active Problem List   Diagnosis Date Noted  . Chest wall abscess 06/15/2017  . Hyperglycemia 06/15/2017  . Abscess   . Hx of medication noncompliance     Past Surgical History:  Procedure Laterality Date  . CHOLECYSTECTOMY    . liver stent       OB History   No obstetric history on file.      Home Medications    Prior to Admission medications   Medication Sig Start Date End Date Taking? Authorizing Provider  albuterol (PROVENTIL HFA;VENTOLIN HFA) 108 (90 Base) MCG/ACT inhaler Inhale 2 puffs into the lungs every 4 (four) hours as needed for wheezing or shortness of breath. 10/09/18   Joy, Shawn C, PA-C  amLODipine (NORVASC) 10 MG tablet Take 1 tablet (10 mg total) by mouth daily. 06/19/17   Dessa Phi, DO  benzonatate (TESSALON) 100 MG capsule Take 1 capsule (100 mg total) by mouth every 8 (eight) hours. 10/09/18   Joy, Shawn C, PA-C  blood glucose meter kit and supplies Dispense based on patient and insurance preference. Use up to four times daily as directed. (FOR ICD-9 250.00, 250.01). 06/18/17   Dessa Phi, DO  cephALEXin (KEFLEX) 500 MG capsule Take 1 capsule (500 mg total) by  mouth 4 (four) times daily. 12/30/17   Veryl Speak, MD  fluconazole (DIFLUCAN) 150 MG tablet Take 1 tablet (150 mg total) by mouth daily. 12/20/18   Orpah Greek, MD  hydrochlorothiazide (HYDRODIURIL) 25 MG tablet Take 1 tablet (25 mg total) by mouth daily. 06/19/17   Dessa Phi, DO  insulin aspart protamine- aspart (NOVOLOG MIX 70/30) (70-30) 100 UNIT/ML injection Inject 0.25 mLs (25 Units total) into the skin 2 (two) times daily with a meal. 06/18/17 07/18/17  Dessa Phi, DO  Insulin Syringe-Needle U-100 (INSULIN SYRINGE .3CC/31GX5/16") 31G X 5/16" 0.3 ML MISC Use with insulin, twice daily 06/18/17   Dessa Phi, DO  lisinopril (PRINIVIL,ZESTRIL) 10 MG tablet Take 1 tablet (10 mg total) by mouth daily. 06/19/17   Dessa Phi, DO  METFORMIN HCL PO Take 1,500 mg by mouth daily.    [provider]  naproxen sodium (ANAPROX) 220 MG tablet Take 880 mg by mouth daily as needed (mild pain).    [provider]  predniSONE (STERAPRED UNI-PAK 21 TAB) 10 MG (21) TBPK tablet Take 6 tabs (40m) day 1, 5 tabs (542m day 2, 4 tabs (4085mday 3, 3 tabs (4m57may 4, 2 tabs (20mg71my 5, and 1 tab (10mg)67m 6. 10/09/18   Joy, SLorayne Bender    Family  History No family history on file.  Social History Social History   Tobacco Use  . Smoking status: Current Some Day Smoker    Types: Cigarettes  . Smokeless tobacco: Never Used  Substance Use Topics  . Alcohol use: No  . Drug use: No     Allergies   Contrast media [iodinated diagnostic agents]   Review of Systems Review of Systems  Genitourinary: Positive for vaginal pain.  All other systems reviewed and are negative.    Physical Exam Updated Vital Signs BP (!) 155/98 (BP Location: Left Arm)   Pulse 99   Temp 97.9 F (36.6 C) (Oral)   Resp 20   Ht 5' 7" (1.702 m)   Wt 108.9 kg   LMP 12/15/2018   SpO2 98%   BMI 37.59 kg/m   Physical Exam Vitals signs and nursing note reviewed.  Constitutional:       General: She is not in acute distress.    Appearance: Normal appearance. She is well-developed.  HENT:     Head: Normocephalic and atraumatic.     Right Ear: Hearing normal.     Left Ear: Hearing normal.     Nose: Nose normal.  Eyes:     Conjunctiva/sclera: Conjunctivae normal.     Pupils: Pupils are equal, round, and reactive to light.  Neck:     Musculoskeletal: Normal range of motion and neck supple.  Cardiovascular:     Rate and Rhythm: Regular rhythm.     Heart sounds: S1 normal and S2 normal. No murmur. No friction rub. No gallop.   Pulmonary:     Effort: Pulmonary effort is normal. No respiratory distress.     Breath sounds: Normal breath sounds.  Chest:     Chest wall: No tenderness.  Abdominal:     General: Bowel sounds are normal.     Palpations: Abdomen is soft.     Tenderness: There is no abdominal tenderness. There is no guarding or rebound. Negative signs include Murphy's sign and McBurney's sign.     Hernia: No hernia is present.  Genitourinary:    Labia:        Right: Rash (Erythema and adherent white discharge), tenderness and lesion (Excoriation versus ulceration) present.        Left: Rash (Erythema and adherent white discharge), tenderness and lesion (Excoriation versus ulceration) present.      Cervix: Normal.     Uterus: Normal.   Musculoskeletal: Normal range of motion.  Skin:    General: Skin is warm and dry.     Findings: No rash.  Neurological:     Mental Status: She is alert and oriented to person, place, and time.     GCS: GCS eye subscore is 4. GCS verbal subscore is 5. GCS motor subscore is 6.     Cranial Nerves: No cranial nerve deficit.     Sensory: No sensory deficit.     Coordination: Coordination normal.  Psychiatric:        Speech: Speech normal.        Behavior: Behavior normal.        Thought Content: Thought content normal.      ED Treatments / Results  Labs (all labs ordered are listed, but only abnormal results are  displayed) Labs Reviewed  WET PREP, GENITAL - Abnormal; Notable for the following components:      Result Value   Trich, Wet Prep PRESENT (*)    Clue Cells Wet Prep HPF POC PRESENT (*)  WBC, Wet Prep HPF POC MANY (*)    All other components within normal limits  URINALYSIS, ROUTINE W REFLEX MICROSCOPIC - Abnormal; Notable for the following components:   APPearance HAZY (*)    Glucose, UA >=500 (*)    Hgb urine dipstick TRACE (*)    Leukocytes,Ua TRACE (*)    All other components within normal limits  URINALYSIS, MICROSCOPIC (REFLEX) - Abnormal; Notable for the following components:   Bacteria, UA MANY (*)    Trichomonas, UA PRESENT (*)    All other components within normal limits  URINE CULTURE  PREGNANCY, URINE  HERPES SIMPLEX VIRUS(HSV) DNA BY PCR  GC/CHLAMYDIA PROBE AMP ([place]) NOT AT [facility]    EKG None  Radiology No results found.  Procedures Procedures (including critical care time)  Medications Ordered in ED Medications  cefTRIAXone (ROCEPHIN) injection 250 mg (250 mg Intramuscular Given 12/20/18 0435)  metroNIDAZOLE (FLAGYL) tablet 2,000 mg (2,000 mg Oral Given 12/20/18 0431)  fluconazole (DIFLUCAN) tablet 150 mg (150 mg Oral Given 12/20/18 0433)  lidocaine (LMX) 4 % cream (1 application Topical Given 12/20/18 0435)  azithromycin (ZITHROMAX) powder 1 g (1 g Oral Given 12/20/18 0434)     Initial Impression / Assessment and Plan / ED Course  I have reviewed the triage vital signs and the nursing notes.  Pertinent labs & imaging results that were available during my care of the patient were reviewed by me and considered in my medical decision making (see chart for details).        Patient presents with irritation and pain of the vaginal introitus.  There are multiple lesions present, excoriations versus possible ulceration.  Patient denies any previous history of lesions and herpes, reports that she has not had sexual intercourse in 6 months.  This does not rule  out herpes, as she might have missed the initial outbreak and this could be recurrence.  Herpes testing has been sent.  She does have external erythema, swelling with white discharge consistent with yeast.  Speculum exam was otherwise unremarkable.  Will treat empirically for STD as the patient does have a positive trichomonas.  She will be treated with Diflucan, topical lidocaine for her pain.  Follow-up with OB/GYN.  Final Clinical Impressions(s) / ED Diagnoses   Final diagnoses:  Yeast vaginitis  Trichomoniasis    ED Discharge Orders         Ordered    fluconazole (DIFLUCAN) 150 MG tablet  Daily     12/20/18 0440           Orpah Greek, MD 12/20/18 [phone]

## 2018-12-20 NOTE — Discharge Instructions (Addendum)
In addition to the above findings, you did have sores on the outside part of your vagina that are either from scratching or possibly herpes.  The herpes test will not come back today, will be available in several days.  We will call you with the positive result and these results should be available in my chart.  Please schedule follow-up with OB/GYN for routine Pap smear and ongoing care.

## 2018-12-21 LAB — URINE CULTURE: Culture: 30000 — AB

## 2018-12-22 ENCOUNTER — Telehealth: Payer: Self-pay | Admitting: Emergency Medicine

## 2018-12-22 NOTE — Telephone Encounter (Signed)
Post ED Visit - Positive Culture Follow-up  Culture report reviewed by antimicrobial stewardship pharmacist: [facility] []  Enzo Bi, Pharm.D. []  Celedonio Miyamoto, Pharm.D., BCPS AQ-ID []  Garvin Fila, Pharm.D., BCPS []  Georgina Pillion, [address].D., BCPS [x]  [place], [place].D., BCPS, AAHIVP []  Estella Husk, Pharm.D., BCPS, AAHIVP []  Lysle Pearl, PharmD, BCPS []  Phillips Climes, PharmD, BCPS []  Agapito Games, PharmD, BCPS []  Verlan Friends, PharmD []  Mervyn Gay, PharmD, BCPS []  Vinnie Level, PharmD  [facility] []  Len Childs, PharmD []  Greer Pickerel, PharmD []  Adalberto Cole, PharmD []  Perlie Gold, Rph []  Lonell Face) Jean Rosenthal, PharmD []  Earl Many, PharmD []  Junita Push, PharmD []  Dorna Leitz, PharmD []  Terrilee Files, PharmD []  Lynann Beaver, PharmD []  Keturah Barre, PharmD []  Loralee Pacas, PharmD []  Bernadene Person, PharmD   Positive urine culture Treated with Fluconazole, organism sensitive to the same and no further patient follow-up is required at this time.  Carollee Herter Tollie Canada 12/22/2018, 12:56 PM

## 2018-12-23 LAB — GC/CHLAMYDIA PROBE AMP (~~LOC~~) NOT AT ARMC
Chlamydia: NEGATIVE
Neisseria Gonorrhea: NEGATIVE

## 2018-12-24 LAB — HSV CULTURE AND TYPING

## 2019-05-30 ENCOUNTER — Other Ambulatory Visit: Payer: Self-pay

## 2019-05-30 ENCOUNTER — Emergency Department (HOSPITAL_BASED_OUTPATIENT_CLINIC_OR_DEPARTMENT_OTHER): Payer: Self-pay

## 2019-05-30 ENCOUNTER — Emergency Department (HOSPITAL_BASED_OUTPATIENT_CLINIC_OR_DEPARTMENT_OTHER)
Admission: EM | Admit: 2019-05-30 | Discharge: 2019-05-30 | Disposition: A | Discharge status: Home / Self Care | Payer: Self-pay | Attending: Emergency Medicine | Admitting: Emergency Medicine

## 2019-05-30 ENCOUNTER — Encounter (HOSPITAL_BASED_OUTPATIENT_CLINIC_OR_DEPARTMENT_OTHER): Payer: Self-pay

## 2019-05-30 DIAGNOSIS — F17210 Nicotine dependence, cigarettes, uncomplicated: Secondary | ICD-10-CM | POA: Insufficient documentation

## 2019-05-30 DIAGNOSIS — Z91041 Radiographic dye allergy status: Secondary | ICD-10-CM | POA: Insufficient documentation

## 2019-05-30 DIAGNOSIS — R0789 Other chest pain: Secondary | ICD-10-CM | POA: Insufficient documentation

## 2019-05-30 DIAGNOSIS — Z20828 Contact with and (suspected) exposure to other viral communicable diseases: Secondary | ICD-10-CM | POA: Insufficient documentation

## 2019-05-30 DIAGNOSIS — R079 Chest pain, unspecified: Secondary | ICD-10-CM

## 2019-05-30 DIAGNOSIS — Z79899 Other long term (current) drug therapy: Secondary | ICD-10-CM | POA: Insufficient documentation

## 2019-05-30 DIAGNOSIS — I10 Essential (primary) hypertension: Secondary | ICD-10-CM

## 2019-05-30 DIAGNOSIS — Z794 Long term (current) use of insulin: Secondary | ICD-10-CM | POA: Insufficient documentation

## 2019-05-30 DIAGNOSIS — E119 Type 2 diabetes mellitus without complications: Secondary | ICD-10-CM | POA: Insufficient documentation

## 2019-05-30 DIAGNOSIS — Z20822 Contact with and (suspected) exposure to covid-19: Secondary | ICD-10-CM

## 2019-05-30 LAB — CBC
HCT: 37.2 % (ref 36.0–46.0)
Hemoglobin: 11.5 g/dL — ABNORMAL LOW (ref 12.0–15.0)
MCH: 25.7 pg — ABNORMAL LOW (ref 26.0–34.0)
MCHC: 30.9 g/dL (ref 30.0–36.0)
MCV: 83 fL (ref 80.0–100.0)
Platelets: 297 10*3/uL (ref 150–400)
RBC: 4.48 MIL/uL (ref 3.87–5.11)
RDW: 14.2 % (ref 11.5–15.5)
WBC: 11.6 10*3/uL — ABNORMAL HIGH (ref 4.0–10.5)
nRBC: 0 % (ref 0.0–0.2)

## 2019-05-30 LAB — TROPONIN I (HIGH SENSITIVITY): Troponin I (High Sensitivity): 9 ng/L (ref <18)

## 2019-05-30 LAB — BASIC METABOLIC PANEL
Anion gap: 8 (ref 5–15)
BUN: 12 mg/dL (ref 6–20)
CO2: 23 mmol/L (ref 22–32)
Calcium: 8.6 mg/dL — ABNORMAL LOW (ref 8.9–10.3)
Chloride: 105 mmol/L (ref 98–111)
Creatinine, Ser: 0.64 mg/dL (ref 0.44–1.00)
GFR calc Af Amer: >60 mL/min (ref >60)
GFR calc non Af Amer: >60 mL/min (ref >60)
Glucose, Bld: 171 mg/dL — ABNORMAL HIGH (ref 70–99)
Potassium: 3.4 mmol/L — ABNORMAL LOW (ref 3.5–5.1)
Sodium: 136 mmol/L (ref 135–145)

## 2019-05-30 MED ORDER — AMLODIPINE BESYLATE 10 MG PO TABS: 10.0000 mg | ORAL_TABLET | Freq: Every day | ORAL | 0 refills | Status: DC

## 2019-05-30 MED ORDER — LISINOPRIL 10 MG PO TABS
20.0000 mg | ORAL_TABLET | Freq: Once | ORAL | Status: AC
  Administered 2019-05-30: 20 mg via ORAL
  Filled 2019-05-30: qty 2

## 2019-05-30 NOTE — ED Notes (Signed)
Mid sternal cp x 2 weeks w leg pain

## 2019-05-30 NOTE — ED Triage Notes (Signed)
Pt c/o CP x 2 weeks-NAD-steady gait

## 2019-05-30 NOTE — ED Provider Notes (Signed)
[facility] EMERGENCY DEPARTMENT Provider Note   CSN: [id number] Arrival date & time: 05/30/19  1307  History   Chief Complaint Chief Complaint  Patient presents with  . Chest Pain    HPI Carolyn Zimmerman is a 33 y.o. female with Hx of HTN, DM and obesity presenting with intermittent retrosternal chest pain and shortness of breath for two weeks with associated leg pain. She reports most recent episode this morning while at work. She reports that the onset of symptoms are sudden and relieved with 5-10 minutes of rest.  She also reports that she has been working longer hours and has been under a lot of stress recently. She attributes her chest pain to recent stressors and when she is having anxiety. Patient denies any headaches, dizziness, palpitations, diaphoresis, or recent sick contacts.    Past Medical History:  Diagnosis Date  . Diabetes mellitus   . Hypertension   . Jaundice   . Obesity     Patient Active Problem List   Diagnosis Date Noted  . Chest wall abscess 06/15/2017  . Hyperglycemia 06/15/2017  . Abscess   . Hx of medication noncompliance     Past Surgical History:  Procedure Laterality Date  . CHOLECYSTECTOMY    . liver stent       OB History   No obstetric history on file.      Home Medications    Prior to Admission medications   Medication Sig Start Date End Date Taking? Authorizing Provider  albuterol (PROVENTIL HFA;VENTOLIN HFA) 108 (90 Base) MCG/ACT inhaler Inhale 2 puffs into the lungs every 4 (four) hours as needed for wheezing or shortness of breath. 10/09/18   Joy, Shawn C, PA-C  amLODipine (NORVASC) 10 MG tablet Take 1 tablet (10 mg total) by mouth daily. 05/30/19 06/29/19  Harvie Heck, MD  benzonatate (TESSALON) 100 MG capsule Take 1 capsule (100 mg total) by mouth every 8 (eight) hours. 10/09/18   Joy, Shawn C, PA-C  blood glucose meter kit and supplies Dispense based on patient and insurance preference. Use up to four times daily as  directed. (FOR ICD-9 250.00, 250.01). 06/18/17   Dessa Phi, DO  cephALEXin (KEFLEX) 500 MG capsule Take 1 capsule (500 mg total) by mouth 4 (four) times daily. 12/30/17   Veryl Speak, MD  fluconazole (DIFLUCAN) 150 MG tablet Take 1 tablet (150 mg total) by mouth daily. 12/20/18   Orpah Greek, MD  hydrochlorothiazide (HYDRODIURIL) 25 MG tablet Take 1 tablet (25 mg total) by mouth daily. 06/19/17   Dessa Phi, DO  insulin aspart protamine- aspart (NOVOLOG MIX 70/30) (70-30) 100 UNIT/ML injection Inject 0.25 mLs (25 Units total) into the skin 2 (two) times daily with a meal. 06/18/17 07/18/17  Dessa Phi, DO  Insulin Syringe-Needle U-100 (INSULIN SYRINGE .3CC/31GX5/16") 31G X 5/16" 0.3 ML MISC Use with insulin, twice daily 06/18/17   Dessa Phi, DO  lisinopril (PRINIVIL,ZESTRIL) 10 MG tablet Take 1 tablet (10 mg total) by mouth daily. 06/19/17   Dessa Phi, DO  METFORMIN HCL PO Take 1,500 mg by mouth daily.    [provider]  naproxen sodium (ANAPROX) 220 MG tablet Take 880 mg by mouth daily as needed (mild pain).    [provider]  predniSONE (STERAPRED UNI-PAK 21 TAB) 10 MG (21) TBPK tablet Take 6 tabs ('60mg'$ ) day 1, 5 tabs ('50mg'$ ) day 2, 4 tabs ('40mg'$ ) day 3, 3 tabs ('30mg'$ ) day 4, 2 tabs ('20mg'$ ) day 5, and 1 tab ('10mg'$ ) day 6.  10/09/18   Joy, Helane Gunther, PA-C    Family History No family history on file.  Social History Social History   Tobacco Use  . Smoking status: Current Some Day Smoker    Types: Cigarettes  . Smokeless tobacco: Never Used  Substance Use Topics  . Alcohol use: Yes    Comment: occ  . Drug use: No     Allergies   Contrast media [iodinated diagnostic agents]   Review of Systems Review of Systems  Constitutional: Negative for chills, diaphoresis and fever.  HENT: Negative.   Eyes: Negative for photophobia and visual disturbance.  Respiratory: Positive for shortness of breath. Negative for cough, chest tightness and wheezing.    Cardiovascular: Positive for chest pain. Negative for palpitations.  Gastrointestinal: Negative for abdominal pain, constipation, diarrhea, nausea and vomiting.  Endocrine: Negative.   Genitourinary: Negative.   Musculoskeletal: Positive for myalgias.  Skin: Negative.   Allergic/Immunologic: Negative.   Neurological: Negative for dizziness, syncope, weakness, numbness and headaches.  Hematological: Negative.   Psychiatric/Behavioral: Negative.      Physical Exam Updated Vital Signs BP (!) 181/112   Pulse 70   Temp 98.2 F (36.8 C) (Oral)   Resp (!) 8   Ht '5\' 7"'$  (1.702 m)   Wt 127 kg   LMP 05/21/2019   SpO2 100%   BMI 43.85 kg/m   Physical Exam Constitutional:      General: She is not in acute distress.    Appearance: She is obese. She is not diaphoretic.  HENT:     Head: Normocephalic and atraumatic.  Cardiovascular:     Rate and Rhythm: Normal rate and regular rhythm.     Heart sounds: Normal heart sounds. No friction rub. No gallop.   Pulmonary:     Effort: Pulmonary effort is normal. No tachypnea, accessory muscle usage or respiratory distress.     Breath sounds: Normal breath sounds. No wheezing, rhonchi or rales.  Chest:     Chest wall: No tenderness.  Abdominal:     General: Bowel sounds are normal. There is no abdominal bruit.     Palpations: Abdomen is soft.     Tenderness: There is no guarding or rebound.  Musculoskeletal: Normal range of motion.     Right lower leg: She exhibits no tenderness. No edema.     Left lower leg: She exhibits no tenderness. No edema.  Skin:    General: Skin is warm and dry.     Capillary Refill: Capillary refill takes less than 2 seconds.  Neurological:     General: No focal deficit present.     Mental Status: She is alert and oriented to person, place, and time.     Cranial Nerves: No cranial nerve deficit.     Motor: No weakness.  Psychiatric:        Mood and Affect: Mood is anxious.    ED Treatments / Results  Labs  (all labs ordered are listed, but only abnormal results are displayed) Labs Reviewed  BASIC METABOLIC PANEL - Abnormal; Notable for the following components:      Result Value   Potassium 3.4 (*)    Glucose, Bld 171 (*)    Calcium 8.6 (*)    All other components within normal limits  CBC - Abnormal; Notable for the following components:   WBC 11.6 (*)    Hemoglobin 11.5 (*)    MCH 25.7 (*)    All other components within normal limits  NOVEL CORONAVIRUS, NAA (  HOSP ORDER, SEND-OUT TO REF LAB; TAT 18-24 HRS)  TROPONIN I (HIGH SENSITIVITY)    EKG EKG Interpretation  Date/Time:  Friday May 30 2019 13:23:43 EDT Ventricular Rate:  85 PR Interval:    QRS Duration: 109 QT Interval:  388 QTC Calculation: 462 R Axis:   54 Text Interpretation:  Sinus rhythm Probable left atrial enlargement Borderline repolarization abnormality No old tracing to compare Confirmed by Isla Pence [phone]) on 05/30/2019 1:26:51 PM   Radiology Dg Chest Port 1 View  Result Date: 05/30/2019 CLINICAL DATA:  Sternal chest pain for the past 2 weeks. EXAM: PORTABLE CHEST 1 VIEW COMPARISON:  Chest x-ray dated October 09, 2018. FINDINGS: Stable mild cardiomegaly. Normal pulmonary vascularity. No focal consolidation, pleural effusion, or pneumothorax. No acute osseous abnormality. IMPRESSION: No active disease. Electronically Signed   By: Titus Dubin M.D.   On: 05/30/2019 14:22    Procedures Procedures (including critical care time)  Medications Ordered in ED Medications  lisinopril (ZESTRIL) tablet 20 mg (20 mg Oral Given 05/30/19 1501)     Initial Impression / Assessment and Plan / ED Course  I have reviewed the triage vital signs and the nursing notes.  Pertinent labs & imaging results that were available during my care of the patient were reviewed by me and considered in my medical decision making (see chart for details).  Patient is a 33yrold female with Hx of HTN, DM, HLD and obesity  presenting with intermittent retrosternal chest pain for 2 weeks. Her most recent episode of chest pain was earlier in the day and associated with shortness of breath. Patient is hemodynamically stable. On examination, patient appears to be anxious however not in acute distress. Chest pain not reproducible on palpation. EKG without any significant ischemic changes. CXR without any acute cardiopulmonary disease. hsTrop negative.  Patient observed in ED and she did not have any symptoms. However, continued to have elevated BP. She reports that she only takes lisinopril at home which she did not take today. Patient given lisinopril without any significant changes in BP. Patient discharged to home with addition of amlodipine and instructed to follow up with PCP for better BP control. Patient agreeable with plan.  Given her HTN, HLD, DM and obesity, patient will need to follow up with PCP and get further work up for ACS, possible stable angina with treadmill stress test and Echo as outpatient. Patient agreeable to follow up with PCP for further work up and management.   Current episodes of chest pain are likely due to her anxiety and increased stress at work. Patient reports that [facility] [employer] has been working 6 days a week recently. She also has two children at home that she is worried about and situation involving her children's father that is causing her emotional distress. She has heightened anxiety and reports the onset of symptoms mostly when she is anxious about something. [facility]   Given that patient is working at [employer] in contact with multiple customers daily, will test for COVID as patient presented with complaints of shortness of breath. COVID send out test ordered.   Patient discharged to home with addition of amlodipine to BP medication regimen. She is agreeable with discharge plan and understands return  precautions.   Final Clinical Impressions(s) / ED Diagnoses   Final diagnoses:  Chest pain, unspecified type    ED Discharge Orders  Ordered    amLODipine (NORVASC) 10 MG tablet  Daily     05/30/19 1550           Harvie Heck, MD 05/30/19 2254    Isla Pence, MD 05/31/19 1441

## 2019-05-30 NOTE — ED Notes (Signed)
Pt on monitor 

## 2019-05-30 NOTE — Discharge Instructions (Addendum)
Carolyn Zimmerman,  Carolyn Zimmerman were seen in the Ed today for chest pain. Your chest x-ray, EKG and high sensitivity troponin levels were all wnl and ruled out any acute myocardial infarction. Your BP was elevated in the ED. I have sent a course of amlodipine to your pharmacy to take in addition to the lisinopril. However, given your elevated BP, you should follow up with your primary care provider for better BP control. Also, we discussed your anxiety and how it can be contributing to your symptoms. I have attached a referral to the [facility] and [facility] for further resources. If needed, you may also follow up with them for a PCP.   Please seek emergent medical care if you develop chest pain with shortness of breath or profuse sweating.

## 2019-05-31 LAB — NOVEL CORONAVIRUS, NAA (HOSP ORDER, SEND-OUT TO REF LAB; TAT 18-24 HRS): SARS-CoV-2, NAA: NOT DETECTED

## 2019-07-07 ENCOUNTER — Encounter (HOSPITAL_BASED_OUTPATIENT_CLINIC_OR_DEPARTMENT_OTHER): Payer: Self-pay | Admitting: *Deleted

## 2019-07-07 ENCOUNTER — Other Ambulatory Visit: Payer: Self-pay

## 2019-07-07 ENCOUNTER — Emergency Department (HOSPITAL_BASED_OUTPATIENT_CLINIC_OR_DEPARTMENT_OTHER)
Admission: EM | Admit: 2019-07-07 | Discharge: 2019-07-07 | Disposition: A | Discharge status: Home / Self Care | Payer: Self-pay | Attending: Emergency Medicine | Admitting: Emergency Medicine

## 2019-07-07 DIAGNOSIS — Z794 Long term (current) use of insulin: Secondary | ICD-10-CM | POA: Insufficient documentation

## 2019-07-07 DIAGNOSIS — E119 Type 2 diabetes mellitus without complications: Secondary | ICD-10-CM | POA: Insufficient documentation

## 2019-07-07 DIAGNOSIS — I10 Essential (primary) hypertension: Secondary | ICD-10-CM | POA: Insufficient documentation

## 2019-07-07 DIAGNOSIS — Z79899 Other long term (current) drug therapy: Secondary | ICD-10-CM | POA: Insufficient documentation

## 2019-07-07 DIAGNOSIS — K529 Noninfective gastroenteritis and colitis, unspecified: Secondary | ICD-10-CM | POA: Insufficient documentation

## 2019-07-07 DIAGNOSIS — F17210 Nicotine dependence, cigarettes, uncomplicated: Secondary | ICD-10-CM | POA: Insufficient documentation

## 2019-07-07 DIAGNOSIS — A599 Trichomoniasis, unspecified: Secondary | ICD-10-CM | POA: Insufficient documentation

## 2019-07-07 LAB — CBC WITH DIFFERENTIAL/PLATELET
Abs Immature Granulocytes: 0.02 10*3/uL (ref 0.00–0.07)
Basophils Absolute: 0 10*3/uL (ref 0.0–0.1)
Basophils Relative: 0 %
Eosinophils Absolute: 0.3 10*3/uL (ref 0.0–0.5)
Eosinophils Relative: 2 %
HCT: 36.3 % (ref 36.0–46.0)
Hemoglobin: 11.2 g/dL — ABNORMAL LOW (ref 12.0–15.0)
Immature Granulocytes: 0 %
Lymphocytes Relative: 33 %
Lymphs Abs: 3.8 10*3/uL (ref 0.7–4.0)
MCH: 25.5 pg — ABNORMAL LOW (ref 26.0–34.0)
MCHC: 30.9 g/dL (ref 30.0–36.0)
MCV: 82.5 fL (ref 80.0–100.0)
Monocytes Absolute: 1.1 10*3/uL — ABNORMAL HIGH (ref 0.1–1.0)
Monocytes Relative: 10 %
Neutro Abs: 6.2 10*3/uL (ref 1.7–7.7)
Neutrophils Relative %: 55 %
Platelets: 331 10*3/uL (ref 150–400)
RBC: 4.4 MIL/uL (ref 3.87–5.11)
RDW: 14.4 % (ref 11.5–15.5)
WBC: 11.4 10*3/uL — ABNORMAL HIGH (ref 4.0–10.5)
nRBC: 0 % (ref 0.0–0.2)

## 2019-07-07 LAB — COMPREHENSIVE METABOLIC PANEL
ALT: 16 U/L (ref 0–44)
AST: 15 U/L (ref 15–41)
Albumin: 3.3 g/dL — ABNORMAL LOW (ref 3.5–5.0)
Alkaline Phosphatase: 70 U/L (ref 38–126)
Anion gap: 9 (ref 5–15)
BUN: 7 mg/dL (ref 6–20)
CO2: 23 mmol/L (ref 22–32)
Calcium: 8.6 mg/dL — ABNORMAL LOW (ref 8.9–10.3)
Chloride: 106 mmol/L (ref 98–111)
Creatinine, Ser: 0.67 mg/dL (ref 0.44–1.00)
GFR calc Af Amer: >60 mL/min (ref >60)
GFR calc non Af Amer: >60 mL/min (ref >60)
Glucose, Bld: 170 mg/dL — ABNORMAL HIGH (ref 70–99)
Potassium: 3.6 mmol/L (ref 3.5–5.1)
Sodium: 138 mmol/L (ref 135–145)
Total Bilirubin: 0.4 mg/dL (ref 0.3–1.2)
Total Protein: 7.3 g/dL (ref 6.5–8.1)

## 2019-07-07 LAB — URINALYSIS, ROUTINE W REFLEX MICROSCOPIC
Bilirubin Urine: NEGATIVE
Glucose, UA: NEGATIVE mg/dL
Hgb urine dipstick: NEGATIVE
Ketones, ur: NEGATIVE mg/dL
Nitrite: NEGATIVE
Protein, ur: NEGATIVE mg/dL
Specific Gravity, Urine: 1.01 (ref 1.005–1.030)
pH: 6.5 (ref 5.0–8.0)

## 2019-07-07 LAB — OCCULT BLOOD X 1 CARD TO LAB, STOOL: Fecal Occult Bld: POSITIVE — AB

## 2019-07-07 LAB — CBG MONITORING, ED: Glucose-Capillary: 162 mg/dL — ABNORMAL HIGH (ref 70–99)

## 2019-07-07 LAB — URINALYSIS, MICROSCOPIC (REFLEX)

## 2019-07-07 LAB — LIPASE, BLOOD: Lipase: 22 U/L (ref 11–51)

## 2019-07-07 LAB — PREGNANCY, URINE: Preg Test, Ur: NEGATIVE

## 2019-07-07 MED ORDER — MORPHINE SULFATE (PF) 4 MG/ML IV SOLN
4.0000 mg | Freq: Once | INTRAVENOUS | Status: DC
  Filled 2019-07-07: qty 1

## 2019-07-07 MED ORDER — METRONIDAZOLE 500 MG PO TABS: 500.0000 mg | ORAL_TABLET | Freq: Two times a day (BID) | ORAL | 0 refills | Status: DC

## 2019-07-07 MED ORDER — SODIUM CHLORIDE 0.9 % IV BOLUS
1000.0000 mL | Freq: Once | INTRAVENOUS | Status: AC
  Administered 2019-07-07: 1000 mL via INTRAVENOUS

## 2019-07-07 MED ORDER — SODIUM CHLORIDE 0.9 % IV BOLUS: 1000.0000 mL | Freq: Once | INTRAVENOUS | Status: DC

## 2019-07-07 MED ORDER — CIPROFLOXACIN HCL 500 MG PO TABS: 500.0000 mg | ORAL_TABLET | Freq: Two times a day (BID) | ORAL | 0 refills | Status: DC

## 2019-07-07 MED ORDER — ONDANSETRON HCL 4 MG/2ML IJ SOLN
4.0000 mg | Freq: Once | INTRAMUSCULAR | Status: AC
  Administered 2019-07-07: 4 mg via INTRAVENOUS
  Filled 2019-07-07: qty 2

## 2019-07-07 NOTE — ED Triage Notes (Signed)
Pt reports mid abd pain since Thursday. No appetite. Multiple episodes of diarrhea. Denies n/v. Denies dysuria. Took a bottle of pepto-bismal today. Also reports 2 episodes of "red chunky diarrhea" tonight. States she has been drinking red gatorade

## 2019-07-07 NOTE — Discharge Instructions (Addendum)
You were seen today for diarrhea and blood in stool.  Your work-up is largely reassuring.  You will be treated for presumptive colitis with Cipro and Flagyl.  This will also treat trichomonas.  Abstain for sexual activity for the next 10 days and always use condoms.

## 2019-07-07 NOTE — ED Notes (Signed)
Report received 

## 2019-07-07 NOTE — ED Provider Notes (Signed)
[facility] EMERGENCY DEPARTMENT Provider Note   CSN: [id number] Arrival date & time: 07/07/19  0003     History   Chief Complaint Chief Complaint  Patient presents with  . Abdominal Pain    HPI Carolyn Zimmerman is a 33 y.o. female.     HPI  This is a 33 year old female with a history of diabetes, hypertension who presents with diarrhea.  Patient reports she has had multiple episodes of watery diarrhea since Thursday.  She is also has some crampy mid abdominal pain.  She rates her abdominal pain at 8 out of 10.  It does not localize or lateralize and is nonradiating.  She states that tonight she went to the bathroom and thinks she may have seen blood in her stool.  She has taken Pepto-Bismol at home with minimal relief of her symptoms.  Denies nausea and vomiting.  She does report decreased appetite.  She reports that she has been been staying hydrated.  Denies any recent travel or antibiotic use.  Past Medical History:  Diagnosis Date  . Diabetes mellitus   . Hypertension   . Jaundice   . Obesity     Patient Active Problem List   Diagnosis Date Noted  . Chest wall abscess 06/15/2017  . Hyperglycemia 06/15/2017  . Abscess   . Hx of medication noncompliance     Past Surgical History:  Procedure Laterality Date  . CHOLECYSTECTOMY    . liver stent       OB History   No obstetric history on file.      Home Medications    Prior to Admission medications   Medication Sig Start Date End Date Taking? Authorizing Provider  albuterol (PROVENTIL HFA;VENTOLIN HFA) 108 (90 Base) MCG/ACT inhaler Inhale 2 puffs into the lungs every 4 (four) hours as needed for wheezing or shortness of breath. 10/09/18   Joy, Shawn C, PA-C  amLODipine (NORVASC) 10 MG tablet Take 1 tablet (10 mg total) by mouth daily. 05/30/19 06/29/19  Harvie Heck, MD  benzonatate (TESSALON) 100 MG capsule Take 1 capsule (100 mg total) by mouth every 8 (eight) hours. 10/09/18   Joy, Shawn C, PA-C  blood  glucose meter kit and supplies Dispense based on patient and insurance preference. Use up to four times daily as directed. (FOR ICD-9 250.00, 250.01). 06/18/17   Dessa Phi, DO  cephALEXin (KEFLEX) 500 MG capsule Take 1 capsule (500 mg total) by mouth 4 (four) times daily. 12/30/17   Veryl Speak, MD  ciprofloxacin (CIPRO) 500 MG tablet Take 1 tablet (500 mg total) by mouth 2 (two) times daily. 07/07/19   Gurman Ashland, Barbette Hair, MD  fluconazole (DIFLUCAN) 150 MG tablet Take 1 tablet (150 mg total) by mouth daily. 12/20/18   Orpah Greek, MD  hydrochlorothiazide (HYDRODIURIL) 25 MG tablet Take 1 tablet (25 mg total) by mouth daily. 06/19/17   Dessa Phi, DO  insulin aspart protamine- aspart (NOVOLOG MIX 70/30) (70-30) 100 UNIT/ML injection Inject 0.25 mLs (25 Units total) into the skin 2 (two) times daily with a meal. 06/18/17 07/18/17  Dessa Phi, DO  Insulin Syringe-Needle U-100 (INSULIN SYRINGE .3CC/31GX5/16") 31G X 5/16" 0.3 ML MISC Use with insulin, twice daily 06/18/17   Dessa Phi, DO  lisinopril (PRINIVIL,ZESTRIL) 10 MG tablet Take 1 tablet (10 mg total) by mouth daily. 06/19/17   Dessa Phi, DO  METFORMIN HCL PO Take 1,500 mg by mouth daily.    [provider]  metroNIDAZOLE (FLAGYL) 500 MG tablet Take 1  tablet (500 mg total) by mouth 2 (two) times daily. 07/07/19   Kinta Martis, Barbette Hair, MD  naproxen sodium (ANAPROX) 220 MG tablet Take 880 mg by mouth daily as needed (mild pain).    [provider]  predniSONE (STERAPRED UNI-PAK 21 TAB) 10 MG (21) TBPK tablet Take 6 tabs ('60mg'$ ) day 1, 5 tabs ('50mg'$ ) day 2, 4 tabs ('40mg'$ ) day 3, 3 tabs ('30mg'$ ) day 4, 2 tabs ('20mg'$ ) day 5, and 1 tab ('10mg'$ ) day 6. 10/09/18   Joy, Helane Gunther, PA-C    Family History No family history on file.  Social History Social History   Tobacco Use  . Smoking status: Current Some Day Smoker    Types: Cigarettes  . Smokeless tobacco: Never Used  Substance Use Topics  . Alcohol use: Not  Currently    Comment: occ  . Drug use: No     Allergies   Contrast media [iodinated diagnostic agents]   Review of Systems Review of Systems  Constitutional: Negative for fever.  Respiratory: Negative for shortness of breath.   Cardiovascular: Negative for chest pain.  Gastrointestinal: Positive for abdominal pain, blood in stool and diarrhea. Negative for nausea and vomiting.  Genitourinary: Negative for dysuria.  All other systems reviewed and are negative.    Physical Exam Updated Vital Signs BP (!) 162/92   Pulse 77   Temp 99 F (37.2 C) (Oral)   Resp 18   Ht 1.715 m (5' 7.5")   Wt 122 kg   LMP 06/14/2019   SpO2 100%   BMI 41.51 kg/m   Physical Exam Vitals signs and nursing note reviewed.  Constitutional:      Appearance: She is well-developed. She is obese. She is not ill-appearing.  HENT:     Head: Normocephalic and atraumatic.  Neck:     Musculoskeletal: Neck supple.  Cardiovascular:     Rate and Rhythm: Normal rate and regular rhythm.     Heart sounds: Normal heart sounds.  Pulmonary:     Effort: Pulmonary effort is normal. No respiratory distress.     Breath sounds: No wheezing.  Abdominal:     General: Bowel sounds are normal.     Palpations: Abdomen is soft.     Tenderness: There is generalized abdominal tenderness. There is no guarding or rebound.  Skin:    General: Skin is warm and dry.  Neurological:     Mental Status: She is alert and oriented to person, place, and time.  Psychiatric:        Mood and Affect: Mood normal.      ED Treatments / Results  Labs (all labs ordered are listed, but only abnormal results are displayed) Labs Reviewed  CBC WITH DIFFERENTIAL/PLATELET - Abnormal; Notable for the following components:      Result Value   WBC 11.4 (*)    Hemoglobin 11.2 (*)    MCH 25.5 (*)    Monocytes Absolute 1.1 (*)    All other components within normal limits  COMPREHENSIVE METABOLIC PANEL - Abnormal; Notable for the  following components:   Glucose, Bld 170 (*)    Calcium 8.6 (*)    Albumin 3.3 (*)    All other components within normal limits  URINALYSIS, ROUTINE W REFLEX MICROSCOPIC - Abnormal; Notable for the following components:   Leukocytes,Ua TRACE (*)    All other components within normal limits  OCCULT BLOOD X 1 CARD TO LAB, STOOL - Abnormal; Notable for the following components:   Fecal Occult  Bld POSITIVE (*)    All other components within normal limits  URINALYSIS, MICROSCOPIC (REFLEX) - Abnormal; Notable for the following components:   Bacteria, UA RARE (*)    Trichomonas, UA PRESENT (*)    All other components within normal limits  CBG MONITORING, ED - Abnormal; Notable for the following components:   Glucose-Capillary 162 (*)    All other components within normal limits  LIPASE, BLOOD  PREGNANCY, URINE  POC OCCULT BLOOD, ED    EKG None  Radiology No results found.  Procedures Procedures (including critical care time)  Medications Ordered in ED Medications  morphine 4 MG/ML injection 4 mg (4 mg Intravenous Refused 07/07/19 0128)  ondansetron (ZOFRAN) injection 4 mg (4 mg Intravenous Given 07/07/19 0058)  sodium chloride 0.9 % bolus 1,000 mL (0 mLs Intravenous Stopped 07/07/19 0336)     Initial Impression / Assessment and Plan / ED Course  I have reviewed the triage vital signs and the nursing notes.  Pertinent labs & imaging results that were available during my care of the patient were reviewed by me and considered in my medical decision making (see chart for details).        Patient presents with abdominal pain and diarrhea.  Overall nontoxic and vital signs notable for elevated blood pressure.  Her abdominal exam is diffusely tender without localized tenderness.  Low risk for C. difficile.  Lab work obtained.  Patient given pain and nausea medication.  Lab work with stable hemoglobin and stable leukocytosis.  Hemoccult was positive on the stool sample.   Additionally urinalysis shows trichomonas.  She is positive and April for trichomonas as well and received 2 g of Flagyl while in the ED.  On recheck, patient is resting comfortably.  She declined morphine for pain.  Given her trichomonas and ongoing diarrhea we discussed empiric treatment for colitis with Cipro and Flagyl which would also cover trichomonas.  At this time given that her exam does not localize, have low suspicion for etiology such as appendicitis or SBO.  Will defer imaging at this time and treat empirically.  Patient is agreeable to plan.  After history, exam, and medical workup I feel the patient has been appropriately medically screened and is safe for discharge home. Pertinent diagnoses were discussed with the patient. Patient was given return precautions.   Final Clinical Impressions(s) / ED Diagnoses   Final diagnoses:  Colitis  Trichimoniasis    ED Discharge Orders         Ordered    ciprofloxacin (CIPRO) 500 MG tablet  2 times daily     07/07/19 0344    metroNIDAZOLE (FLAGYL) 500 MG tablet  2 times daily     07/07/19 0344           Mykia Holton, Barbette Hair, MD 07/07/19 [phone]

## 2019-09-19 DIAGNOSIS — N7093 Salpingitis and oophoritis, unspecified: Secondary | ICD-10-CM

## 2019-09-19 HISTORY — DX: Salpingitis and oophoritis, unspecified: N70.93

## 2019-10-17 ENCOUNTER — Inpatient Hospital Stay (HOSPITAL_BASED_OUTPATIENT_CLINIC_OR_DEPARTMENT_OTHER)
Admission: EM | Admit: 2019-10-17 | Discharge: 2019-10-20 | DRG: 758 | Disposition: A | Discharge status: Home / Self Care | Payer: Self-pay | Attending: Emergency Medicine | Admitting: Emergency Medicine

## 2019-10-17 ENCOUNTER — Emergency Department (HOSPITAL_BASED_OUTPATIENT_CLINIC_OR_DEPARTMENT_OTHER): Payer: Self-pay

## 2019-10-17 ENCOUNTER — Encounter (HOSPITAL_BASED_OUTPATIENT_CLINIC_OR_DEPARTMENT_OTHER): Payer: Self-pay | Admitting: *Deleted

## 2019-10-17 ENCOUNTER — Other Ambulatory Visit: Payer: Self-pay

## 2019-10-17 DIAGNOSIS — Z6841 Body Mass Index (BMI) 40.0 and over, adult: Secondary | ICD-10-CM

## 2019-10-17 DIAGNOSIS — I10 Essential (primary) hypertension: Secondary | ICD-10-CM | POA: Diagnosis present

## 2019-10-17 DIAGNOSIS — K5900 Constipation, unspecified: Secondary | ICD-10-CM | POA: Diagnosis present

## 2019-10-17 DIAGNOSIS — F17210 Nicotine dependence, cigarettes, uncomplicated: Secondary | ICD-10-CM | POA: Diagnosis present

## 2019-10-17 DIAGNOSIS — E1165 Type 2 diabetes mellitus with hyperglycemia: Secondary | ICD-10-CM

## 2019-10-17 DIAGNOSIS — Z79899 Other long term (current) drug therapy: Secondary | ICD-10-CM

## 2019-10-17 DIAGNOSIS — N7093 Salpingitis and oophoritis, unspecified: Principal | ICD-10-CM | POA: Diagnosis present

## 2019-10-17 DIAGNOSIS — E669 Obesity, unspecified: Secondary | ICD-10-CM | POA: Diagnosis present

## 2019-10-17 DIAGNOSIS — R1900 Intra-abdominal and pelvic swelling, mass and lump, unspecified site: Secondary | ICD-10-CM

## 2019-10-17 DIAGNOSIS — Z794 Long term (current) use of insulin: Secondary | ICD-10-CM

## 2019-10-17 DIAGNOSIS — Z20822 Contact with and (suspected) exposure to covid-19: Secondary | ICD-10-CM | POA: Diagnosis present

## 2019-10-17 LAB — WET PREP, GENITAL
Clue Cells Wet Prep HPF POC: NONE SEEN
Sperm: NONE SEEN
Trich, Wet Prep: NONE SEEN
Yeast Wet Prep HPF POC: NONE SEEN

## 2019-10-17 LAB — CBC WITH DIFFERENTIAL/PLATELET
Abs Immature Granulocytes: 0.1 10*3/uL — ABNORMAL HIGH (ref 0.00–0.07)
Basophils Absolute: 0.1 10*3/uL (ref 0.0–0.1)
Basophils Relative: 0 %
Eosinophils Absolute: 0.4 10*3/uL (ref 0.0–0.5)
Eosinophils Relative: 2 %
HCT: 36.2 % (ref 36.0–46.0)
Hemoglobin: 11.3 g/dL — ABNORMAL LOW (ref 12.0–15.0)
Immature Granulocytes: 1 %
Lymphocytes Relative: 19 %
Lymphs Abs: 3.6 10*3/uL (ref 0.7–4.0)
MCH: 25.1 pg — ABNORMAL LOW (ref 26.0–34.0)
MCHC: 31.2 g/dL (ref 30.0–36.0)
MCV: 80.3 fL (ref 80.0–100.0)
Monocytes Absolute: 1.3 10*3/uL — ABNORMAL HIGH (ref 0.1–1.0)
Monocytes Relative: 7 %
Neutro Abs: 13.3 10*3/uL — ABNORMAL HIGH (ref 1.7–7.7)
Neutrophils Relative %: 71 %
Platelets: 357 10*3/uL (ref 150–400)
RBC: 4.51 MIL/uL (ref 3.87–5.11)
RDW: 14.7 % (ref 11.5–15.5)
WBC: 18.7 10*3/uL — ABNORMAL HIGH (ref 4.0–10.5)
nRBC: 0 % (ref 0.0–0.2)

## 2019-10-17 LAB — URINALYSIS, ROUTINE W REFLEX MICROSCOPIC
Bilirubin Urine: NEGATIVE
Glucose, UA: >=500 mg/dL — AB
Hgb urine dipstick: NEGATIVE
Ketones, ur: NEGATIVE mg/dL
Leukocytes,Ua: NEGATIVE
Nitrite: NEGATIVE
Protein, ur: 30 mg/dL — AB
Specific Gravity, Urine: 1.025 (ref 1.005–1.030)
pH: 6.5 (ref 5.0–8.0)

## 2019-10-17 LAB — URINALYSIS, MICROSCOPIC (REFLEX)

## 2019-10-17 LAB — COMPREHENSIVE METABOLIC PANEL
ALT: 13 U/L (ref 0–44)
AST: 13 U/L — ABNORMAL LOW (ref 15–41)
Albumin: 3.5 g/dL (ref 3.5–5.0)
Alkaline Phosphatase: 82 U/L (ref 38–126)
Anion gap: 7 (ref 5–15)
BUN: 13 mg/dL (ref 6–20)
CO2: 25 mmol/L (ref 22–32)
Calcium: 8.7 mg/dL — ABNORMAL LOW (ref 8.9–10.3)
Chloride: 101 mmol/L (ref 98–111)
Creatinine, Ser: 0.61 mg/dL (ref 0.44–1.00)
GFR calc Af Amer: >60 mL/min (ref >60)
GFR calc non Af Amer: >60 mL/min (ref >60)
Glucose, Bld: 283 mg/dL — ABNORMAL HIGH (ref 70–99)
Potassium: 3.5 mmol/L (ref 3.5–5.1)
Sodium: 133 mmol/L — ABNORMAL LOW (ref 135–145)
Total Bilirubin: 0.5 mg/dL (ref 0.3–1.2)
Total Protein: 8 g/dL (ref 6.5–8.1)

## 2019-10-17 LAB — LIPASE, BLOOD: Lipase: 20 U/L (ref 11–51)

## 2019-10-17 LAB — PREGNANCY, URINE: Preg Test, Ur: NEGATIVE

## 2019-10-17 MED ORDER — DOXYCYCLINE HYCLATE 100 MG IV SOLR
INTRAVENOUS | Status: AC
  Filled 2019-10-17: qty 100

## 2019-10-17 MED ORDER — KETOROLAC TROMETHAMINE 30 MG/ML IJ SOLN
30.0000 mg | Freq: Once | INTRAMUSCULAR | Status: AC
  Administered 2019-10-17: 30 mg via INTRAVENOUS
  Filled 2019-10-17: qty 1

## 2019-10-17 MED ORDER — HYDROCORTISONE NA SUCCINATE PF 250 MG IJ SOLR
200.0000 mg | Freq: Once | INTRAMUSCULAR | Status: DC
  Filled 2019-10-17: qty 200

## 2019-10-17 MED ORDER — NICOTINE 21 MG/24HR TD PT24
21.0000 mg | MEDICATED_PATCH | Freq: Every day | TRANSDERMAL | Status: DC
  Administered 2019-10-18 – 2019-10-20 (×3): 21 mg via TRANSDERMAL
  Filled 2019-10-17 (×4): qty 1

## 2019-10-17 MED ORDER — HYDROCORTISONE NA SUCCINATE PF 100 MG IJ SOLR
INTRAMUSCULAR | Status: AC
  Filled 2019-10-17: qty 4

## 2019-10-17 MED ORDER — SODIUM CHLORIDE 0.9 % IV SOLN
2.0000 g | Freq: Two times a day (BID) | INTRAVENOUS | Status: DC
  Filled 2019-10-17: qty 2

## 2019-10-17 MED ORDER — DIPHENHYDRAMINE HCL 50 MG/ML IJ SOLN: 50.0000 mg | Freq: Once | INTRAMUSCULAR | Status: DC

## 2019-10-17 MED ORDER — METRONIDAZOLE IN NACL 5-0.79 MG/ML-% IV SOLN
500.0000 mg | Freq: Once | INTRAVENOUS | Status: AC
  Administered 2019-10-17: 500 mg via INTRAVENOUS
  Filled 2019-10-17: qty 100

## 2019-10-17 MED ORDER — SODIUM CHLORIDE 0.9 % IV SOLN
100.0000 mg | Freq: Once | INTRAVENOUS | Status: AC
  Administered 2019-10-17: 100 mg via INTRAVENOUS
  Filled 2019-10-17: qty 100

## 2019-10-17 MED ORDER — SODIUM CHLORIDE 0.9 % IV SOLN
2.0000 g | Freq: Once | INTRAVENOUS | Status: AC
  Administered 2019-10-18: 2 g via INTRAVENOUS
  Filled 2019-10-17: qty 20

## 2019-10-17 MED ORDER — ENOXAPARIN SODIUM 40 MG/0.4ML ~~LOC~~ SOLN
40.0000 mg | Freq: Every day | SUBCUTANEOUS | Status: DC
  Administered 2019-10-18 – 2019-10-20 (×3): 40 mg via SUBCUTANEOUS
  Filled 2019-10-17 (×3): qty 0.4

## 2019-10-17 MED ORDER — MORPHINE SULFATE (PF) 4 MG/ML IV SOLN
4.0000 mg | Freq: Once | INTRAVENOUS | Status: DC
  Filled 2019-10-17: qty 1

## 2019-10-17 NOTE — ED Triage Notes (Signed)
Pt c/o lower abd pain and back pain constipation x 3 weeks
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
## 2019-10-18 DIAGNOSIS — E1165 Type 2 diabetes mellitus with hyperglycemia: Secondary | ICD-10-CM

## 2019-10-18 DIAGNOSIS — N7093 Salpingitis and oophoritis, unspecified: Principal | ICD-10-CM

## 2019-10-18 LAB — CBC
HCT: 36.3 % (ref 36.0–46.0)
Hemoglobin: 11.2 g/dL — ABNORMAL LOW (ref 12.0–15.0)
MCH: 24.7 pg — ABNORMAL LOW (ref 26.0–34.0)
MCHC: 30.9 g/dL (ref 30.0–36.0)
MCV: 80.1 fL (ref 80.0–100.0)
Platelets: 376 10*3/uL (ref 150–400)
RBC: 4.53 MIL/uL (ref 3.87–5.11)
RDW: 14.9 % (ref 11.5–15.5)
WBC: 17.6 10*3/uL — ABNORMAL HIGH (ref 4.0–10.5)
nRBC: 0 % (ref 0.0–0.2)

## 2019-10-18 LAB — SARS CORONAVIRUS 2 (TAT 6-24 HRS): SARS Coronavirus 2: NEGATIVE

## 2019-10-18 LAB — HEMOGLOBIN A1C
Hgb A1c MFr Bld: 9.4 % — ABNORMAL HIGH (ref 4.8–5.6)
Mean Plasma Glucose: 223.08 mg/dL

## 2019-10-18 LAB — CREATININE, SERUM
Creatinine, Ser: 0.58 mg/dL (ref 0.44–1.00)
GFR calc Af Amer: >60 mL/min (ref >60)
GFR calc non Af Amer: >60 mL/min (ref >60)

## 2019-10-18 LAB — GLUCOSE, CAPILLARY
Glucose-Capillary: 261 mg/dL — ABNORMAL HIGH (ref 70–99)
Glucose-Capillary: 220 mg/dL — ABNORMAL HIGH (ref 70–99)
Glucose-Capillary: 190 mg/dL — ABNORMAL HIGH (ref 70–99)

## 2019-10-18 LAB — RPR: RPR Ser Ql: NONREACTIVE

## 2019-10-18 LAB — HIV ANTIBODY (ROUTINE TESTING W REFLEX): HIV Screen 4th Generation wRfx: NONREACTIVE

## 2019-10-18 MED ORDER — INSULIN GLARGINE 100 UNIT/ML ~~LOC~~ SOLN
10.0000 [IU] | Freq: Every day | SUBCUTANEOUS | Status: DC
  Administered 2019-10-18 – 2019-10-19 (×2): 10 [IU] via SUBCUTANEOUS
  Filled 2019-10-18 (×3): qty 0.1

## 2019-10-18 MED ORDER — ALBUTEROL SULFATE (2.5 MG/3ML) 0.083% IN NEBU: 2.5000 mg | INHALATION_SOLUTION | RESPIRATORY_TRACT | Status: DC | PRN

## 2019-10-18 MED ORDER — DOXYCYCLINE HYCLATE 100 MG PO TABS
100.0000 mg | ORAL_TABLET | Freq: Two times a day (BID) | ORAL | Status: DC
  Administered 2019-10-18 – 2019-10-20 (×5): 100 mg via ORAL
  Filled 2019-10-18 (×5): qty 1

## 2019-10-18 MED ORDER — MENTHOL 3 MG MT LOZG: 1.0000 | LOZENGE | OROMUCOSAL | Status: DC | PRN

## 2019-10-18 MED ORDER — INSULIN ASPART 100 UNIT/ML ~~LOC~~ SOLN
0.0000 [IU] | Freq: Three times a day (TID) | SUBCUTANEOUS | Status: DC
  Administered 2019-10-18: 7 [IU] via SUBCUTANEOUS
  Administered 2019-10-18: 11 [IU] via SUBCUTANEOUS
  Administered 2019-10-19: 7 [IU] via SUBCUTANEOUS
  Administered 2019-10-19 – 2019-10-20 (×3): 4 [IU] via SUBCUTANEOUS

## 2019-10-18 MED ORDER — ALUM & MAG HYDROXIDE-SIMETH 200-200-20 MG/5ML PO SUSP: 30.0000 mL | ORAL | Status: DC | PRN

## 2019-10-18 MED ORDER — IBUPROFEN 600 MG PO TABS: 600.0000 mg | ORAL_TABLET | Freq: Four times a day (QID) | ORAL | Status: DC | PRN

## 2019-10-18 MED ORDER — HYDROMORPHONE HCL 1 MG/ML IJ SOLN: 0.2000 mg | INTRAMUSCULAR | Status: DC | PRN

## 2019-10-18 MED ORDER — PROMETHAZINE HCL 25 MG/ML IJ SOLN: 12.5000 mg | Freq: Four times a day (QID) | INTRAMUSCULAR | Status: DC | PRN

## 2019-10-18 MED ORDER — HYDROCHLOROTHIAZIDE 25 MG PO TABS
25.0000 mg | ORAL_TABLET | Freq: Every day | ORAL | Status: DC
  Administered 2019-10-18 – 2019-10-20 (×3): 25 mg via ORAL
  Filled 2019-10-18 (×3): qty 1

## 2019-10-18 MED ORDER — LISINOPRIL 10 MG PO TABS: 10.0000 mg | ORAL_TABLET | Freq: Every day | ORAL | Status: DC

## 2019-10-18 MED ORDER — METOCLOPRAMIDE HCL 5 MG/ML IJ SOLN
10.0000 mg | Freq: Four times a day (QID) | INTRAMUSCULAR | Status: DC | PRN
  Administered 2019-10-18: 10 mg via INTRAVENOUS
  Filled 2019-10-18: qty 2

## 2019-10-18 MED ORDER — METFORMIN HCL ER 750 MG PO TB24: 1500.0000 mg | ORAL_TABLET | Freq: Every day | ORAL | Status: DC

## 2019-10-18 MED ORDER — AMLODIPINE BESYLATE 10 MG PO TABS: 10.0000 mg | ORAL_TABLET | Freq: Every day | ORAL | Status: DC

## 2019-10-18 MED ORDER — GUAIFENESIN 100 MG/5ML PO SOLN: 15.0000 mL | ORAL | Status: DC | PRN

## 2019-10-18 MED ORDER — SENNOSIDES-DOCUSATE SODIUM 8.6-50 MG PO TABS: 1.0000 | ORAL_TABLET | Freq: Every evening | ORAL | Status: DC | PRN

## 2019-10-18 MED ORDER — OXYCODONE-ACETAMINOPHEN 5-325 MG PO TABS
1.0000 | ORAL_TABLET | ORAL | Status: DC | PRN
  Administered 2019-10-18 (×2): 1 via ORAL
  Administered 2019-10-18: 2 via ORAL
  Administered 2019-10-19 – 2019-10-20 (×2): 1 via ORAL
  Filled 2019-10-18 (×2): qty 1
  Filled 2019-10-18: qty 2
  Filled 2019-10-18 (×2): qty 1

## 2019-10-18 MED ORDER — HYDROCHLOROTHIAZIDE 25 MG PO TABS: 25.0000 mg | ORAL_TABLET | Freq: Every day | ORAL | Status: DC

## 2019-10-18 MED ORDER — ENOXAPARIN SODIUM 40 MG/0.4ML ~~LOC~~ SOLN: 40.0000 mg | SUBCUTANEOUS | Status: DC

## 2019-10-18 MED ORDER — LISINOPRIL 10 MG PO TABS
10.0000 mg | ORAL_TABLET | Freq: Every day | ORAL | Status: DC
  Administered 2019-10-18 – 2019-10-20 (×3): 10 mg via ORAL
  Filled 2019-10-18 (×3): qty 1

## 2019-10-18 MED ORDER — WHITE PETROLATUM EX OINT
TOPICAL_OINTMENT | CUTANEOUS | Status: AC
  Filled 2019-10-18: qty 28.35

## 2019-10-18 MED ORDER — AMLODIPINE BESYLATE 10 MG PO TABS
10.0000 mg | ORAL_TABLET | Freq: Every day | ORAL | Status: DC
  Administered 2019-10-18 – 2019-10-20 (×3): 10 mg via ORAL
  Filled 2019-10-18 (×3): qty 1

## 2019-10-18 MED ORDER — SODIUM CHLORIDE 0.9 % IV SOLN
2.0000 g | Freq: Four times a day (QID) | INTRAVENOUS | Status: AC
  Administered 2019-10-18 – 2019-10-20 (×9): 2 g via INTRAVENOUS
  Filled 2019-10-18 (×9): qty 2

## 2019-10-18 NOTE — H&P (Signed)
Carolyn Zimmerman is an 34 y.o. No obstetric history on file. female.   Chief Complaint: abdominal pain HPI: 3 wk h/o worsening lower abdominal pain left > right but mainly midline. Has long h/o GI issues with multiple ERCPs. Pain began with menses. Findings at [place] with elevated WBC to 18 K.  Past Medical History:  Diagnosis Date  . Diabetes mellitus   . Hypertension   . Jaundice   . Obesity     Past Surgical History:  Procedure Laterality Date  . CHOLECYSTECTOMY    . liver stent      No family history on file. Social History:  reports that she has been smoking cigarettes. She has never used smokeless tobacco. She reports previous alcohol use. She reports that she does not use drugs.  Allergies:  Allergies  Allergen Reactions  . Contrast Media [Iodinated Diagnostic Agents] Hives and Swelling    Medications Prior to Admission  Medication Sig Dispense Refill  . albuterol (PROVENTIL HFA;VENTOLIN HFA) 108 (90 Base) MCG/ACT inhaler Inhale 2 puffs into the lungs every 4 (four) hours as needed for wheezing or shortness of breath. 1 Inhaler 0  . amLODipine (NORVASC) 10 MG tablet Take 1 tablet (10 mg total) by mouth daily. 30 tablet 0  . blood glucose meter kit and supplies Dispense based on patient and insurance preference. Use up to four times daily as directed. (FOR ICD-9 250.00, 250.01). 1 each 0  . hydrochlorothiazide (HYDRODIURIL) 25 MG tablet Take 1 tablet (25 mg total) by mouth daily. 30 tablet 0  . insulin aspart protamine- aspart (NOVOLOG MIX 70/30) (70-30) 100 UNIT/ML injection Inject 0.25 mLs (25 Units total) into the skin 2 (two) times daily with a meal. 15 mL 0  . Insulin Syringe-Needle U-100 (INSULIN SYRINGE .3CC/31GX5/16") 31G X 5/16" 0.3 ML MISC Use with insulin, twice daily 90 each 0  . lisinopril (PRINIVIL,ZESTRIL) 10 MG tablet Take 1 tablet (10 mg total) by mouth daily. 30 tablet 0  . METFORMIN HCL PO Take 1,500 mg by mouth daily.    . naproxen sodium  (ANAPROX) 220 MG tablet Take 880 mg by mouth daily as needed (mild pain).      denies fever, chills, vomiting, diarrhea, vaginal discharge. Denies CP, SOB. Reports mild constipation.  Blood pressure (!) 163/88, pulse 85, temperature 98 F (36.7 C), temperature source Oral, resp. rate 16, height 5' 7" (1.702 m), weight 117.9 kg, last menstrual period 09/26/2019, SpO2 99 %. General appearance: alert, cooperative, appears stated age and mildly obese Head: Normocephalic, without obvious abnormality, atraumatic Neck: supple, symmetrical, trachea midline Lungs: normal effort Heart: regular rate and rhythm Abdomen: soft, diffusely tender worse on left, + rebound Extremities: Homans sign is negative, no sign of DVT Skin: Skin color, texture, turgor normal. No rashes or lesions Neurologic: Grossly normal   Lab Results  Component Value Date   WBC 17.6 (H) 10/18/2019   HGB 11.2 (L) 10/18/2019   HCT 36.3 10/18/2019   MCV 80.1 10/18/2019   PLT 376 10/18/2019   Lab Results  Component Value Date   PREGTESTUR NEGATIVE 10/17/2019    CT Abdomen Pelvis Wo Contrast  Result Date: 10/17/2019 CLINICAL DATA:  Lower abdominal pain and constipation for 3 weeks. Leukocytosis. EXAM: CT ABDOMEN AND PELVIS WITHOUT CONTRAST TECHNIQUE: Multidetector CT imaging of the abdomen and pelvis was performed following the standard protocol without IV contrast. COMPARISON:  03/02/2018 FINDINGS: Lower chest: New small to moderate pericardial effusion. Hepatobiliary: No mass visualized on this unenhanced exam. Prior  cholecystectomy. No evidence of biliary obstruction. Internal common bile duct stent has been removed since previous study. Pancreas: No mass or inflammatory process visualized on this unenhanced exam. Spleen:  Within normal limits in size. Adrenals/Urinary tract: No evidence of urolithiasis or hydronephrosis. Unremarkable unopacified urinary bladder. Stomach/Bowel: No evidence of obstruction, inflammatory process,  or abnormal fluid collections. Vascular/Lymphatic: Sub-cm left paraaortic and common iliac lymph nodes are new, and likely reactive in etiology. Mild bilateral external iliac lymphadenopathy and borderline enlarged bilateral inguinal lymph nodes show no significant change. No evidence of abdominal aortic aneurysm. Reproductive: Uterus is normal in appearance. A new heterogeneous mass is seen in the left adnexa which measures 6.0 x 5.4 cm. Surrounding soft tissue stranding is seen, as well as a tiny amount of free fluid in the pelvic cul-de-sac. Differential diagnosis includes tubo-ovarian abscess, endometriosis with endometrioma, and cystic ovarian neoplasm. Other:  None. Musculoskeletal:  No suspicious bone lesions identified. IMPRESSION: 1. 6 cm heterogeneous left adnexal mass with surrounding soft tissue stranding. Differential diagnosis includes tubo-ovarian abscess, endometriosis with endometrioma, and cystic ovarian neoplasm. 2. New small to moderate pericardial effusion. Electronically Signed   By: Marlaine Hind M.D.   On: 10/17/2019 20:55   US PELVIC COMPLETE WITH TRANSVAGINAL  Result Date: 10/17/2019 CLINICAL DATA:  Initial evaluation for adnexal mass, left flank pain pain. EXAM: TRANSABDOMINAL AND TRANSVAGINAL ULTRASOUND OF PELVIS TECHNIQUE: Both transabdominal and transvaginal ultrasound examinations of the pelvis were performed. Transabdominal technique was performed for global imaging of the pelvis including uterus, ovaries, adnexal regions, and pelvic cul-de-sac. It was necessary to proceed with endovaginal exam following the transabdominal exam to visualize the uterus, endometrium, and ovaries. COMPARISON:  Prior CT from earlier the same day. FINDINGS: Uterus Measurements: 8.2 x 5.3 x 6.0 cm = volume: 136.3 mL. No fibroids or other mass visualized. Endometrium Thickness: 15 mm. No focal abnormality visualized. Small amount of simple anechoic fluid present within the endometrial cavity. Right ovary  Measurements: 2.7 x 1.9 x 1.8 cm = volume: 4.8 mL. Normal appearance/no adnexal mass. Left ovary The native left ovary is not definitely visualized. 6.7 x 5.9 x 5.2 cm complex cystic mass seen within the left adnexa, corresponding with abnormality seen on prior CT. Lesion demonstrates heterogeneous internal cystic components, with scattered internal areas of internal lace-like architecture. Associated vascularity seen about the rim of this lesion. Other findings Small volume free fluid present within the pelvis. IMPRESSION: 1. 6.7 x 5.9 x 5.2 cm complex cystic left adnexal mass, corresponding with abnormality seen on prior CT. Findings indeterminate, with primary differential consideration consisting of tubo-ovarian abscess, particularly given the phlegmonous change on prior CT. Possible ovarian neoplasm not excluded. Please note that the underlying native left ovary is not definitely visualized, precluding possible Doppler evaluation. Gynecologic referral for further workup and surgical consultation recommended. 2. Associated small volume free fluid within the pelvis. Electronically Signed   By: Jeannine Boga M.D.   On: 10/17/2019 22:30    Assessment/Plan Principal Problem:   TOA (tubo-ovarian abscess) Active Problems:   Uncontrolled hypertension   Type 2 diabetes mellitus with hyperglycemia (HCC)  IV Abx x 24-48 hours If no improvement, consider IR drainage Await GC/Chlam cultures BP control - Norvasc + lisinopril + HCTZ Strict glycemic control - SSI + metformin + Lantus    Donnamae Jude 10/18/2019, 8:47 AM

## 2019-10-18 NOTE — Progress Notes (Signed)
Carolyn Zimmerman is a 34 y.o. female patient admitted from [facility] awake, alert - oriented  X 4 - no acute distress noted.  VSS - Blood pressure (!) 160/100, pulse 80, temperature 98 F (36.7 C), temperature source Oral, resp. rate 17, height 5\' 7"  (1.702 m), weight 117.9 kg, last menstrual period 09/26/2019, SpO2 100 %.    IV in place, occlusive dsg intact without redness.     Will cont to eval and treat per MD orders.  11/24/2019, RN 10/18/2019 3:09 AM

## 2019-10-18 NOTE — ED Notes (Addendum)
  Attempted to call report.  Asked to call back in 5 mins  [facility] arrived to transport patient.

## 2019-10-19 LAB — GLUCOSE, CAPILLARY
Glucose-Capillary: 236 mg/dL — ABNORMAL HIGH (ref 70–99)
Glucose-Capillary: 163 mg/dL — ABNORMAL HIGH (ref 70–99)
Glucose-Capillary: 160 mg/dL — ABNORMAL HIGH (ref 70–99)
Glucose-Capillary: 231 mg/dL — ABNORMAL HIGH (ref 70–99)

## 2019-10-19 MED ORDER — SODIUM CHLORIDE 0.9% FLUSH: 10.0000 mL | INTRAVENOUS | Status: DC | PRN

## 2019-10-19 MED ORDER — METRONIDAZOLE 500 MG PO TABS
500.0000 mg | ORAL_TABLET | Freq: Two times a day (BID) | ORAL | Status: DC
  Administered 2019-10-19 – 2019-10-20 (×2): 500 mg via ORAL
  Filled 2019-10-19 (×2): qty 1

## 2019-10-19 NOTE — Progress Notes (Signed)
[facility]ulty [facility] OB/GYN Attending Note  Subjective:  Patient reports minimal abdominal pain. No N/V.  Feels significantly better.  Admitted on 10/17/2019 for TOA (tubo-ovarian abscess).    Objective:  Blood pressure (!) 151/99, pulse 86, temperature 97.9 F (36.6 C), temperature source Oral, resp. rate 16, height 5\' 7"  (1.702 m), weight 117.9 kg, last menstrual period 09/26/2019, SpO2 99 %.  Patient Vitals for the past 24 hrs:  BP Temp Temp src Pulse Resp SpO2  10/19/19 1512 (!) 151/99 97.9 F (36.6 C) Oral 86 16 99 %  10/19/19 0515 (!) 163/107 98.7 F (37.1 C) Oral 86 15 100 %  10/18/19 2053 (!) 158/89 98 F (36.7 C) Axillary 84 17 100 %    Gen: NAD HENT: Normocephalic, atraumatic Lungs: Normal respiratory effort Heart: Regular rate noted Abdomen: NT, soft Cervix: Deferred Ext: 2+ DTRs, no edema, no cyanosis, negative Homan's sign  Assessment & Plan:  34 y.o. F admitted for TOA, feeling better on antibiotic therapy. Will switch to oral antibiotics in morning, possible discharge later tomorrow No need for IR intervention. Continue DM and HTN meds. Continue close observation.  38, MD, FACOG Obstetrician & Gynecologist, [facility]

## 2019-10-20 ENCOUNTER — Encounter (HOSPITAL_COMMUNITY): Payer: Self-pay | Admitting: Family Medicine

## 2019-10-20 LAB — GLUCOSE, CAPILLARY: Glucose-Capillary: 184 mg/dL — ABNORMAL HIGH (ref 70–99)

## 2019-10-20 LAB — GC/CHLAMYDIA PROBE AMP (~~LOC~~) NOT AT ARMC
Chlamydia: NEGATIVE
Neisseria Gonorrhea: POSITIVE — AB

## 2019-10-20 MED ORDER — DOXYCYCLINE HYCLATE 100 MG PO TABS: 100.0000 mg | ORAL_TABLET | Freq: Two times a day (BID) | ORAL | 0 refills | Status: DC

## 2019-10-20 MED ORDER — IBUPROFEN 600 MG PO TABS: 600.0000 mg | ORAL_TABLET | Freq: Four times a day (QID) | ORAL | 3 refills | Status: DC | PRN

## 2019-10-20 MED ORDER — OXYCODONE-ACETAMINOPHEN 5-325 MG PO TABS: 1.0000 | ORAL_TABLET | ORAL | 0 refills | Status: DC | PRN

## 2019-10-20 MED ORDER — METRONIDAZOLE 500 MG PO TABS: 500.0000 mg | ORAL_TABLET | Freq: Two times a day (BID) | ORAL | 0 refills | Status: DC

## 2019-10-20 MED ORDER — NICOTINE 21 MG/24HR TD PT24: 21.0000 mg | MEDICATED_PATCH | Freq: Every day | TRANSDERMAL | 0 refills | Status: DC

## 2019-10-20 MED FILL — IBUPROFEN 600 MG TABLET: 600 | 8 days supply | Qty: 30 | Fill #0

## 2019-10-20 MED FILL — NICOTINE 21 MG/24HR PATCH: 21 | 28 days supply | Qty: 28 | Fill #0

## 2019-10-20 MED FILL — metroNIDAZOLE 500 MG TABS: 500 | 10 days supply | Qty: 20 | Fill #0

## 2019-10-20 MED FILL — DOXYCYCLINE HYCLATE 100 MG: 100 | 10 days supply | Qty: 20 | Fill #0

## 2019-10-20 MED FILL — OXYCODONE-APAP 5-325MG: 5-325 | 3 days supply | Qty: 15 | Fill #0

## 2019-10-20 NOTE — Progress Notes (Signed)
Discharged patient to home, AVS given and explained. Patient's concerns addressed and questions were answered to patient's satisfaction. Home medications delivered to room prior discharge. All belongings returned accordingly.

## 2019-10-20 NOTE — Discharge Instructions (Signed)
Pelvic Inflammatory Disease  Pelvic inflammatory disease (PID) is caused by an infection in some or all of the female reproductive organs. The infection can be in the uterus, ovaries, fallopian tubes, or the surrounding tissues in the pelvis. PID can cause abdominal or pelvic pain that comes on suddenly (acute pelvic pain). PID is a serious infection because it can lead to lasting (chronic) pelvic pain or the inability to have children (infertility). What are the causes? This condition is most often caused by bacteria that is spread during sexual contact. It can also be caused by a bacterial infection of the vagina (bacterial vaginosis) that is not spread by sexual contact. This condition occurs when the infection is not treated and the bacteria travel upward from the vagina or cervix into the reproductive organs. Bacteria may also be introduced into the reproductive organs following:  The birth of a baby.  A miscarriage.  An abortion.  Major pelvic surgery.  The insertion of an intrauterine device (IUD).  A sexual assault. What increases the risk? You are more likely to develop this condition if you:  Are younger than 32 years of age.  Are sexually active at a young age.  Have a history of STI (sexually transmitted infection) or PID.  Do not regularly use barrier contraception methods, such as condoms.  Have multiple sexual partners.  Have sex with someone who has symptoms of an STI.  Use a vaginal douche.  Have recently had an IUD inserted. What are the signs or symptoms? Symptoms of this condition include:  Abdominal or pelvic pain.  Fever.  Chills.  Abnormal vaginal discharge.  Abnormal uterine bleeding.  Unusual pain shortly after the end of a menstrual period.  Painful urination.  Pain with sex.  Nausea and vomiting. How is this diagnosed? This condition is diagnosed based on a pelvic exam and medical history. A pelvic exam can reveal signs of  infection, inflammation, and discharge in the vagina and the surrounding tissues. It can also help to identify painful areas. You may also have tests, such as:  Lab tests, including a pregnancy test, blood tests, and a urine test.  Culture tests of the vagina and cervix to check for an STI.  Ultrasound.  A laparoscopic procedure to look inside the pelvis.  Examination of vaginal discharge under a microscope. How is this treated? This condition may be treated with:  Antibiotic medicines taken by mouth (orally). For more severe cases, antibiotics may be given through an IV at the hospital.  Surgery. This is rare. Surgery may be needed if other treatments do not help.  Efforts to stop the spread of the infection. Sexual partners may need to be treated if the infection is caused by an STI. It may take weeks until you are completely well. If you are diagnosed with PID, you should also be checked for HIV (human immunodeficiency virus). Your health care provider may test you for infection again 3 months after treatment. You should not have unprotected sex. Follow these instructions at home:  Take over-the-counter and prescription medicines only as told by your health care provider.  If you were prescribed an antibiotic medicine, take it as told by your health care provider. Do not stop using the antibiotic even if you start to feel better.  Do not have sex until treatment is completed or as told by your health care provider. If PID is confirmed, your recent sexual partners will need treatment, especially if you had unprotected sex.  Keep all   follow-up visits as told by your health care provider. This is important. Contact a health care provider if:  You have increased or abnormal vaginal discharge.  Your pain does not improve.  You vomit.  You have a fever.  You cannot tolerate your medicines.  Your partner has an STI.  You have pain when you urinate. Get help right away if:   You have increased abdominal or pelvic pain.  You have chills.  Your symptoms are not better in 72 hours with treatment. Summary  Pelvic inflammatory disease (PID) is caused by an infection in some or all of the female reproductive organs.  PID is a serious infection because it can lead to lasting (chronic) pelvic pain or the inability to have children (infertility).  This infection is usually treated with antibiotic medicines.  Do not have sex until treatment is completed or as told by your health care provider. This information is not intended to replace advice given to you by your health care provider. Make sure you discuss any questions you have with your health care provider. Document Revised: 05/23/2018 Document Reviewed: 05/28/2018 Elsevier Patient Education  2020 [employer]  

## 2019-10-20 NOTE — Discharge Summary (Signed)
OB Discharge Summary     Patient Name: Carolyn Zimmerman DOB: 06/03/1986 MRN: [id number]  Date of admission: 10/17/2019  Date of discharge: 10/20/2019  Admitting diagnosis: Pelvic mass in female [R19.00] TOA (tubo-ovarian abscess) [N70.93] Type 2 diabetes mellitus with hyperglycemia, unspecified whether long term insulin use ([place]) [E11.65] Intrauterine pregnancy: Unknown     Secondary diagnosis:  Principal Problem:   TOA (tubo-ovarian abscess) Active Problems:   Uncontrolled hypertension   Type 2 diabetes mellitus with hyperglycemia [facility])      Discharge diagnosis: [place] [facility] course:  Patient admitted for inpatient treatment of TOA. Patient with a 3-week history of lower abdominal pain. Ultrasound findings highly suspicious for TOA. Patient received treatment with IV antibiotics. On HD#2 she reported resolution of her abdominal pain. She remained afebrile. Patient was discharged home in stable conditions. Discharge instructions were reviewed with the patient  Physical exam  Vitals:   10/19/19 2113 10/19/19 2341 10/20/19 0500 10/20/19 0553  BP: (!) 185/108 (!) 169/105  (!) 157/81  Pulse: 84   80  Resp: 18     Temp: 98.7 F (37.1 C)   98.4 F (36.9 C)  TempSrc: Oral   Oral  SpO2: 100%   100%  Weight:   117.9 kg   Height:       GENERAL: Well-developed, well-nourished female in no acute distress.  LUNGS: Clear to auscultation bilaterally.  HEART: Regular rate and rhythm. ABDOMEN: Soft, nontender, nondistended. No organomegaly. PELVIC: Not performed EXTREMITIES: No cyanosis, clubbing, or edema, 2+ distal pulses.  Labs: Lab Results  Component Value Date   WBC 17.6 (H) 10/18/2019   HGB 11.2 (L) 10/18/2019   HCT 36.3 10/18/2019   MCV 80.1 10/18/2019   PLT 376 10/18/2019   CMP Latest Ref Rng & Units 10/18/2019  Glucose 70 - 99 mg/dL -  BUN 6 - 20 mg/dL -  Creatinine 0.44 - 1.00  mg/dL 0.58  Sodium 135 - 145 mmol/L -  Potassium 3.5 - 5.1 mmol/L -  Chloride 98 - 111 mmol/L -  CO2 22 - 32 mmol/L -  Calcium 8.9 - 10.3 mg/dL -  Total Protein 6.5 - 8.1 g/dL -  Total Bilirubin 0.3 - 1.2 mg/dL -  Alkaline Phos 38 - 126 U/L -  AST 15 - 41 U/L -  ALT 0 - 44 U/L -    Discharge instruction: per After Visit Summary   After visit meds:  Allergies as of 10/20/2019      Reactions   Contrast Media [iodinated Diagnostic Agents] Hives, Swelling      Medication List    STOP taking these medications   naproxen sodium 220 MG tablet Commonly known as: ALEVE     TAKE these medications   albuterol 108 (90 Base) MCG/ACT inhaler Commonly known as: VENTOLIN HFA Inhale 2 puffs into the lungs every 4 (four) hours as needed for wheezing or shortness of breath.   amLODipine 10 MG tablet  Commonly known as: NORVASC Take 1 tablet (10 mg total) by mouth daily.   blood glucose meter kit and supplies Dispense based on patient and insurance preference. Use up to four times daily as directed. (FOR ICD-9 250.00, 250.01).   doxycycline 100 MG tablet Commonly known as: VIBRA-TABS Take 1 tablet (100 mg total) by mouth every 12 (twelve) hours.   hydrochlorothiazide 25 MG tablet Commonly known as: HYDRODIURIL Take 1 tablet (25 mg total) by mouth daily.   ibuprofen 600 MG tablet Commonly known as: ADVIL Take 1 tablet (600 mg total) by mouth every 6 (six) hours as needed (mild pain).   insulin aspart protamine- aspart (70-30) 100 UNIT/ML injection Commonly known as: NOVOLOG MIX 70/30 Inject 0.25 mLs (25 Units total) into the skin 2 (two) times daily with a meal.   INSULIN SYRINGE .3CC/31GX5/16" 31G X 5/16" 0.3 ML Misc Use with insulin, twice daily   lisinopril 10 MG tablet Commonly known as: ZESTRIL Take 1 tablet (10 mg total) by mouth daily.   metFORMIN 500 MG 24 hr tablet Commonly known as: GLUCOPHAGE-XR Take 1,500 mg by mouth daily with breakfast.   metroNIDAZOLE 500 MG  tablet Commonly known as: FLAGYL Take 1 tablet (500 mg total) by mouth every 12 (twelve) hours.   nicotine 21 mg/24hr patch Commonly known as: NICODERM CQ - dosed in mg/24 hours Place 1 patch (21 mg total) onto the skin daily.   oxyCODONE-acetaminophen 5-325 MG tablet Commonly known as: PERCOCET/ROXICET Take 1 tablet by mouth every 4 (four) hours as needed (moderate to severe pain (when tolerating fluids)).       Diet: carb modified diet  Activity: Advance as tolerated. Pelvic rest for 6 weeks.   Outpatient follow up:2 weeks   10/20/2019 Mora Bellman, MD

## 2020-03-11 ENCOUNTER — Other Ambulatory Visit: Payer: Self-pay

## 2020-03-11 ENCOUNTER — Encounter (HOSPITAL_BASED_OUTPATIENT_CLINIC_OR_DEPARTMENT_OTHER): Payer: Self-pay

## 2020-03-11 DIAGNOSIS — R5383 Other fatigue: Secondary | ICD-10-CM | POA: Diagnosis not present

## 2020-03-11 DIAGNOSIS — Z5321 Procedure and treatment not carried out due to patient leaving prior to being seen by health care provider: Secondary | ICD-10-CM | POA: Insufficient documentation

## 2020-03-11 DIAGNOSIS — R42 Dizziness and giddiness: Secondary | ICD-10-CM | POA: Diagnosis not present

## 2020-03-11 DIAGNOSIS — R05 Cough: Secondary | ICD-10-CM | POA: Diagnosis not present

## 2020-03-11 DIAGNOSIS — R110 Nausea: Secondary | ICD-10-CM | POA: Diagnosis not present

## 2020-03-11 LAB — URINALYSIS, ROUTINE W REFLEX MICROSCOPIC
Bilirubin Urine: NEGATIVE
Glucose, UA: >=500 mg/dL — AB
Ketones, ur: NEGATIVE mg/dL
Leukocytes,Ua: NEGATIVE
Nitrite: NEGATIVE
Protein, ur: NEGATIVE mg/dL
Specific Gravity, Urine: 1.02 (ref 1.005–1.030)
pH: 7 (ref 5.0–8.0)

## 2020-03-11 LAB — CBC
HCT: 44.4 % (ref 36.0–46.0)
Hemoglobin: 13.5 g/dL (ref 12.0–15.0)
MCH: 24.1 pg — ABNORMAL LOW (ref 26.0–34.0)
MCHC: 30.4 g/dL (ref 30.0–36.0)
MCV: 79.1 fL — ABNORMAL LOW (ref 80.0–100.0)
Platelets: 407 10*3/uL — ABNORMAL HIGH (ref 150–400)
RBC: 5.61 MIL/uL — ABNORMAL HIGH (ref 3.87–5.11)
RDW: 15.4 % (ref 11.5–15.5)
WBC: 11.3 10*3/uL — ABNORMAL HIGH (ref 4.0–10.5)
nRBC: 0 % (ref 0.0–0.2)

## 2020-03-11 LAB — URINALYSIS, MICROSCOPIC (REFLEX)

## 2020-03-11 LAB — PREGNANCY, URINE: Preg Test, Ur: NEGATIVE

## 2020-03-11 MED ORDER — ONDANSETRON 4 MG PO TBDP
4.0000 mg | ORAL_TABLET | Freq: Once | ORAL | Status: AC | PRN
  Administered 2020-03-12: 4 mg via ORAL
  Filled 2020-03-11: qty 1

## 2020-03-11 NOTE — ED Triage Notes (Signed)
Morning nausea x 3 weeks and cough. Today pt started feeling lightheaded and fatigue.

## 2020-03-12 ENCOUNTER — Emergency Department (HOSPITAL_BASED_OUTPATIENT_CLINIC_OR_DEPARTMENT_OTHER)
Admission: EM | Admit: 2020-03-12 | Discharge: 2020-03-12 | Discharge status: Against Medical Advice | Payer: BC Managed Care – PPO | Attending: Emergency Medicine | Admitting: Emergency Medicine

## 2020-03-12 LAB — COMPREHENSIVE METABOLIC PANEL
ALT: 37 U/L (ref 0–44)
AST: 17 U/L (ref 15–41)
Albumin: 4 g/dL (ref 3.5–5.0)
Alkaline Phosphatase: 108 U/L (ref 38–126)
Anion gap: 10 (ref 5–15)
BUN: 12 mg/dL (ref 6–20)
CO2: 27 mmol/L (ref 22–32)
Calcium: 9.1 mg/dL (ref 8.9–10.3)
Chloride: 99 mmol/L (ref 98–111)
Creatinine, Ser: 0.67 mg/dL (ref 0.44–1.00)
GFR calc Af Amer: >60 mL/min (ref >60)
GFR calc non Af Amer: >60 mL/min (ref >60)
Glucose, Bld: 347 mg/dL — ABNORMAL HIGH (ref 70–99)
Potassium: 3.9 mmol/L (ref 3.5–5.1)
Sodium: 136 mmol/L (ref 135–145)
Total Bilirubin: 0.4 mg/dL (ref 0.3–1.2)
Total Protein: 8.9 g/dL — ABNORMAL HIGH (ref 6.5–8.1)

## 2020-03-12 LAB — LIPASE, BLOOD: Lipase: 36 U/L (ref 11–51)

## 2020-03-25 ENCOUNTER — Emergency Department (HOSPITAL_BASED_OUTPATIENT_CLINIC_OR_DEPARTMENT_OTHER): Payer: BC Managed Care – PPO

## 2020-03-25 ENCOUNTER — Emergency Department (HOSPITAL_BASED_OUTPATIENT_CLINIC_OR_DEPARTMENT_OTHER)
Admission: EM | Admit: 2020-03-25 | Discharge: 2020-03-25 | Disposition: A | Discharge status: Home / Self Care | Payer: BC Managed Care – PPO | Attending: Emergency Medicine | Admitting: Emergency Medicine

## 2020-03-25 ENCOUNTER — Encounter (HOSPITAL_BASED_OUTPATIENT_CLINIC_OR_DEPARTMENT_OTHER): Payer: Self-pay | Admitting: Emergency Medicine

## 2020-03-25 ENCOUNTER — Other Ambulatory Visit: Payer: Self-pay

## 2020-03-25 DIAGNOSIS — R0989 Other specified symptoms and signs involving the circulatory and respiratory systems: Secondary | ICD-10-CM | POA: Diagnosis not present

## 2020-03-25 DIAGNOSIS — R739 Hyperglycemia, unspecified: Secondary | ICD-10-CM

## 2020-03-25 DIAGNOSIS — R0602 Shortness of breath: Secondary | ICD-10-CM | POA: Diagnosis not present

## 2020-03-25 DIAGNOSIS — R112 Nausea with vomiting, unspecified: Secondary | ICD-10-CM | POA: Diagnosis not present

## 2020-03-25 DIAGNOSIS — R110 Nausea: Secondary | ICD-10-CM

## 2020-03-25 DIAGNOSIS — R531 Weakness: Secondary | ICD-10-CM | POA: Diagnosis not present

## 2020-03-25 DIAGNOSIS — Z794 Long term (current) use of insulin: Secondary | ICD-10-CM | POA: Diagnosis not present

## 2020-03-25 DIAGNOSIS — I10 Essential (primary) hypertension: Secondary | ICD-10-CM | POA: Insufficient documentation

## 2020-03-25 DIAGNOSIS — Z20822 Contact with and (suspected) exposure to covid-19: Secondary | ICD-10-CM | POA: Insufficient documentation

## 2020-03-25 DIAGNOSIS — R630 Anorexia: Secondary | ICD-10-CM | POA: Diagnosis not present

## 2020-03-25 DIAGNOSIS — R5383 Other fatigue: Secondary | ICD-10-CM | POA: Diagnosis not present

## 2020-03-25 DIAGNOSIS — E1165 Type 2 diabetes mellitus with hyperglycemia: Secondary | ICD-10-CM | POA: Diagnosis not present

## 2020-03-25 LAB — CBC WITH DIFFERENTIAL/PLATELET
Abs Immature Granulocytes: 0.06 10*3/uL (ref 0.00–0.07)
Basophils Absolute: 0.1 10*3/uL (ref 0.0–0.1)
Basophils Relative: 1 %
Eosinophils Absolute: 0.2 10*3/uL (ref 0.0–0.5)
Eosinophils Relative: 2 %
HCT: 38.4 % (ref 36.0–46.0)
Hemoglobin: 11.7 g/dL — ABNORMAL LOW (ref 12.0–15.0)
Immature Granulocytes: 1 %
Lymphocytes Relative: 29 %
Lymphs Abs: 3.2 10*3/uL (ref 0.7–4.0)
MCH: 24.1 pg — ABNORMAL LOW (ref 26.0–34.0)
MCHC: 30.5 g/dL (ref 30.0–36.0)
MCV: 79.2 fL — ABNORMAL LOW (ref 80.0–100.0)
Monocytes Absolute: 0.8 10*3/uL (ref 0.1–1.0)
Monocytes Relative: 7 %
Neutro Abs: 6.8 10*3/uL (ref 1.7–7.7)
Neutrophils Relative %: 60 %
Platelets: 345 10*3/uL (ref 150–400)
RBC: 4.85 MIL/uL (ref 3.87–5.11)
RDW: 15.3 % (ref 11.5–15.5)
WBC: 11.1 10*3/uL — ABNORMAL HIGH (ref 4.0–10.5)
nRBC: 0 % (ref 0.0–0.2)

## 2020-03-25 LAB — LIPASE, BLOOD: Lipase: 28 U/L (ref 11–51)

## 2020-03-25 LAB — URINALYSIS, MICROSCOPIC (REFLEX)

## 2020-03-25 LAB — TROPONIN I (HIGH SENSITIVITY): Troponin I (High Sensitivity): 14 ng/L (ref <18)

## 2020-03-25 LAB — COMPREHENSIVE METABOLIC PANEL
ALT: 13 U/L (ref 0–44)
AST: 15 U/L (ref 15–41)
Albumin: 3.2 g/dL — ABNORMAL LOW (ref 3.5–5.0)
Alkaline Phosphatase: 85 U/L (ref 38–126)
Anion gap: 9 (ref 5–15)
BUN: 11 mg/dL (ref 6–20)
CO2: 24 mmol/L (ref 22–32)
Calcium: 8.3 mg/dL — ABNORMAL LOW (ref 8.9–10.3)
Chloride: 100 mmol/L (ref 98–111)
Creatinine, Ser: 0.68 mg/dL (ref 0.44–1.00)
GFR calc Af Amer: >60 mL/min (ref >60)
GFR calc non Af Amer: >60 mL/min (ref >60)
Glucose, Bld: 398 mg/dL — ABNORMAL HIGH (ref 70–99)
Potassium: 4.1 mmol/L (ref 3.5–5.1)
Sodium: 133 mmol/L — ABNORMAL LOW (ref 135–145)
Total Bilirubin: 0.5 mg/dL (ref 0.3–1.2)
Total Protein: 7.2 g/dL (ref 6.5–8.1)

## 2020-03-25 LAB — CBG MONITORING, ED: Glucose-Capillary: 336 mg/dL — ABNORMAL HIGH (ref 70–99)

## 2020-03-25 LAB — URINALYSIS, ROUTINE W REFLEX MICROSCOPIC
Bilirubin Urine: NEGATIVE
Glucose, UA: >=500 mg/dL — AB
Ketones, ur: NEGATIVE mg/dL
Leukocytes,Ua: NEGATIVE
Nitrite: NEGATIVE
Protein, ur: NEGATIVE mg/dL
Specific Gravity, Urine: 1.015 (ref 1.005–1.030)
pH: 6 (ref 5.0–8.0)

## 2020-03-25 LAB — BRAIN NATRIURETIC PEPTIDE: B Natriuretic Peptide: 108.3 pg/mL — ABNORMAL HIGH (ref 0.0–100.0)

## 2020-03-25 LAB — PREGNANCY, URINE: Preg Test, Ur: NEGATIVE

## 2020-03-25 LAB — SARS CORONAVIRUS 2 BY RT PCR (HOSPITAL ORDER, PERFORMED IN ~~LOC~~ HOSPITAL LAB): SARS Coronavirus 2: NEGATIVE

## 2020-03-25 MED ORDER — METFORMIN HCL 500 MG PO TABS
500.0000 mg | ORAL_TABLET | Freq: Two times a day (BID) | ORAL | 0 refills | Status: DC
Start: 2020-03-25 — End: 2020-05-12

## 2020-03-25 MED ORDER — ONDANSETRON 4 MG PO TBDP
4.0000 mg | ORAL_TABLET | Freq: Three times a day (TID) | ORAL | 0 refills | Status: DC | PRN
Start: 2020-03-25 — End: 2020-05-12

## 2020-03-25 MED ORDER — LISINOPRIL 10 MG PO TABS: 10.0000 mg | ORAL_TABLET | Freq: Every day | ORAL | 0 refills | Status: DC

## 2020-03-25 MED ORDER — AMLODIPINE BESYLATE 5 MG PO TABS: 5.0000 mg | ORAL_TABLET | Freq: Every day | ORAL | 0 refills | Status: DC

## 2020-03-25 NOTE — ED Provider Notes (Signed)
[place] EMERGENCY DEPARTMENT Provider Note   CSN: [id number] Arrival date & time: 03/25/20  0732     History Chief Complaint  Patient presents with  . Weakness    Carolyn Zimmerman is a 34 y.o. female.  HPI      Nausea, shortness of breath, generalized weakness, lightheadedness Tried to come to ED but was busy Woke up this AM, vomited on the way to work Has been going on for one month Threw up probably 5-6 times over last month.  Nausea has been constant, vomiting occasionally.  Reports when had last "jaundice" bile duct stricture This feels similar No consistent abdominal pain, none today Today very nauseas and weak Yesterday had some CP after walking, otherwise has not had any Shortness of breath over last month, nothing makes it better other than if no activity. If walking at work or home notices it. Not worse with laying down flat Cough for one month, some mucus   Past Medical History:  Diagnosis Date  . Diabetes mellitus   . Hypertension   . Jaundice   . Obesity   . TOA (tubo-ovarian abscess) 09/2019    Patient Active Problem List   Diagnosis Date Noted  . Type 2 diabetes mellitus with hyperglycemia ([place]) 10/18/2019  . TOA (tubo-ovarian abscess) 10/17/2019  . Elevated LFTs 09/16/2018  . Uncontrolled hypertension 09/16/2018  . Bile duct stricture 07/30/2018  . Hyperglycemia 06/15/2017  . Hx of medication noncompliance     Past Surgical History:  Procedure Laterality Date  . CHOLECYSTECTOMY    . liver stent       OB History   No obstetric history on file.     No family history on file.  Social History   Tobacco Use  . Smoking status: Current Some Day Smoker    Types: Cigarettes  . Smokeless tobacco: Never Used  Vaping Use  . Vaping Use: Former  Substance Use Topics  . Alcohol use: Not Currently    Comment: occ  . Drug use: No    Home Medications Prior to Admission medications   Medication Sig Start Date End Date Taking?  Authorizing Provider  albuterol (PROVENTIL HFA;VENTOLIN HFA) 108 (90 Base) MCG/ACT inhaler Inhale 2 puffs into the lungs every 4 (four) hours as needed for wheezing or shortness of breath. Patient not taking: Reported on 10/18/2019 10/09/18   Joy, Raquel Sarna C, PA-C  amLODipine (NORVASC) 5 MG tablet Take 1 tablet (5 mg total) by mouth daily. 03/25/20 04/24/20  Gareth Morgan, MD  blood glucose meter kit and supplies Dispense based on patient and insurance preference. Use up to four times daily as directed. (FOR ICD-9 250.00, 250.01). 06/18/17   Dessa Phi, DO  hydrochlorothiazide (HYDRODIURIL) 25 MG tablet Take 1 tablet (25 mg total) by mouth daily. Patient not taking: Reported on 10/18/2019 06/19/17   Dessa Phi, DO  ibuprofen (ADVIL) 600 MG tablet Take 1 tablet (600 mg total) by mouth every 6 (six) hours as needed (mild pain). 10/20/19   Constant, Peggy, MD  insulin aspart protamine- aspart (NOVOLOG MIX 70/30) (70-30) 100 UNIT/ML injection Inject 0.25 mLs (25 Units total) into the skin 2 (two) times daily with a meal. Patient not taking: Reported on 10/18/2019 06/18/17 07/18/17  Dessa Phi, DO  Insulin Syringe-Needle U-100 (INSULIN SYRINGE .3CC/31GX5/16") 31G X 5/16" 0.3 ML MISC Use with insulin, twice daily 06/18/17   Dessa Phi, DO  lisinopril (ZESTRIL) 10 MG tablet Take 1 tablet (10 mg total) by mouth daily. 03/25/20  Gareth Morgan, MD  metFORMIN (GLUCOPHAGE) 500 MG tablet Take 1 tablet (500 mg total) by mouth 2 (two) times daily. 03/25/20 04/24/20  Gareth Morgan, MD  ondansetron (ZOFRAN ODT) 4 MG disintegrating tablet Take 1 tablet (4 mg total) by mouth every 8 (eight) hours as needed for nausea or vomiting. 03/25/20   Gareth Morgan, MD    Allergies    Contrast media [iodinated diagnostic agents]  Review of Systems   Review of Systems  Constitutional: Positive for activity change, appetite change and fatigue. Negative for fever.  HENT: Negative for congestion and sore throat.   Eyes:  Negative for visual disturbance.  Respiratory: Positive for cough and shortness of breath.   Cardiovascular: Negative for chest pain.  Gastrointestinal: Positive for nausea and vomiting. Negative for abdominal pain, constipation and diarrhea.  Genitourinary: Negative for difficulty urinating and dysuria.  Musculoskeletal: Negative for back pain and neck pain.  Skin: Negative for rash.  Neurological: Negative for syncope and headaches.    Physical Exam Updated Vital Signs BP (!) 180/116 (BP Location: Right Arm)   Pulse 78   Temp 98.3 F (36.8 C) (Oral)   Resp 16   Ht '5\' 7"'$  (1.702 m)   Wt 134.3 kg   LMP 03/11/2020   SpO2 99%   BMI 46.38 kg/m   Physical Exam Vitals and nursing note reviewed.  Constitutional:      General: She is not in acute distress.    Appearance: She is well-developed. She is not diaphoretic.  HENT:     Head: Normocephalic and atraumatic.  Eyes:     Conjunctiva/sclera: Conjunctivae normal.  Cardiovascular:     Rate and Rhythm: Normal rate and regular rhythm.     Heart sounds: Normal heart sounds. No murmur heard.  No friction rub. No gallop.   Pulmonary:     Effort: Pulmonary effort is normal. No respiratory distress.     Breath sounds: Normal breath sounds. No wheezing or rales.  Abdominal:     General: There is no distension.     Palpations: Abdomen is soft.     Tenderness: There is no abdominal tenderness. There is no guarding.  Musculoskeletal:        General: No tenderness.     Cervical back: Normal range of motion.  Skin:    General: Skin is warm and dry.     Findings: No erythema or rash.  Neurological:     Mental Status: She is alert and oriented to person, place, and time.     ED Results / Procedures / Treatments   Labs (all labs ordered are listed, but only abnormal results are displayed) Labs Reviewed  CBC WITH DIFFERENTIAL/PLATELET - Abnormal; Notable for the following components:      Result Value   WBC 11.1 (*)    Hemoglobin  11.7 (*)    MCV 79.2 (*)    MCH 24.1 (*)    All other components within normal limits  COMPREHENSIVE METABOLIC PANEL - Abnormal; Notable for the following components:   Sodium 133 (*)    Glucose, Bld 398 (*)    Calcium 8.3 (*)    Albumin 3.2 (*)    All other components within normal limits  URINALYSIS, ROUTINE W REFLEX MICROSCOPIC - Abnormal; Notable for the following components:   Glucose, UA >=500 (*)    Hgb urine dipstick TRACE (*)    All other components within normal limits  BRAIN NATRIURETIC PEPTIDE - Abnormal; Notable for the following components:  B Natriuretic Peptide 108.3 (*)    All other components within normal limits  URINALYSIS, MICROSCOPIC (REFLEX) - Abnormal; Notable for the following components:   Bacteria, UA FEW (*)    All other components within normal limits  CBG MONITORING, ED - Abnormal; Notable for the following components:   Glucose-Capillary 336 (*)    All other components within normal limits  SARS CORONAVIRUS 2 BY RT PCR (HOSPITAL ORDER, [place] [facility])  PREGNANCY, URINE  LIPASE, BLOOD  TROPONIN I (HIGH SENSITIVITY)  TROPONIN I (HIGH SENSITIVITY)    EKG EKG Interpretation  Date/Time:  Thursday March 25 2020 07:46:37 EDT Ventricular Rate:  91 PR Interval:    QRS Duration: 111 QT Interval:  395 QTC Calculation: 486 R Axis:   21 Text Interpretation: Sinus rhythm Probable left atrial enlargement LVH with secondary repolarization abnormality Borderline prolonged QT interval TW abnormality lateral leads new from priori Confirmed by Gareth Morgan [phone]) on 03/25/2020 8:56:12 AM   Radiology DG Chest 2 View  Result Date: 03/25/2020 CLINICAL DATA:  Cough, dyspnea EXAM: CHEST - 2 VIEW COMPARISON:  05/30/2019 FINDINGS: Lungs are clear. No pneumothorax or pleural effusion. Moderate cardiomegaly is stable. Central pulmonary vascular congestion without overt pulmonary edema persists. No acute bone abnormality. IMPRESSION: Stable  examination. Moderate cardiomegaly with central pulmonary vascular congestion. Electronically Signed   By: Fidela Salisbury MD   On: 03/25/2020 08:24    Procedures Procedures (including critical care time)  Medications Ordered in ED Medications - No data to display  ED Course  I have reviewed the triage vital signs and the nursing notes.  Pertinent labs & imaging results that were available during my care of the patient were reviewed by me and considered in my medical decision making (see chart for details).    MDM Rules/Calculators/A&P                          34yo female with history of DM, biliary stricture/obstruction, TOA, hypertension who presents with concern for generalized weakness, fatigue, nausea, cough.  Differential diagnosis includes anemia, electrolyte abnormality, cardiac abnormality, infection, hyperglycemia/other toxic/metabolic abnormalities.  No focal neurologic concerns on history or exam to suggest CVA, ICH or other central etiology.  Pt hemodynamically stable, afebrile.  Urinalysis shows no sign of infection. Chest x-ray shows cardiomegaly with central pulmonary vascular congestion . COVID testing negative.  CBC showed no significant anemia.  Electrolytes show hyperglycemia without signs of DKA.  EKG without STEMI, She does not have chest pain today, and troponin 14 and have low suspicion for cardiac ischemia etiology of symptoms. No signs of significant acute HF however recommend PCP follow up given cardiomegaly on XR.   Recommend PCP  Follow up, consider thyroid studies, ECHO. Possible symptoms of generalized weakness and nausea related to uncontrolled hyperglycemia and hypertension in setting of being off of medications for several months.  Will reinitiate low dose antihypertensives, metformin and nausea medications. Patient discharged in stable condition with understanding of reasons to return.      Final Clinical Impression(s) / ED Diagnoses Final diagnoses:    Generalized weakness  Essential hypertension  Hyperglycemia  Nausea    Rx / DC Orders ED Discharge Orders         Ordered    amLODipine (NORVASC) 5 MG tablet  Daily     Discontinue  Reprint     03/25/20 0914    lisinopril (ZESTRIL) 10 MG tablet  Daily  Discontinue  Reprint     03/25/20 0914    metFORMIN (GLUCOPHAGE) 500 MG tablet  2 times daily     Discontinue  Reprint     03/25/20 0914    ondansetron (ZOFRAN ODT) 4 MG disintegrating tablet  Every 8 hours PRN     Discontinue  Reprint     03/25/20 0518           Gareth Morgan, MD 03/25/20 1827

## 2020-03-25 NOTE — ED Triage Notes (Addendum)
States "I have been sick for a month". C/o cough SOB, cough, weak, nauseated. States she cannot afford her meds for diabetes and HTN

## 2020-03-25 NOTE — ED Notes (Signed)
ED Provider at bedside. 

## 2020-04-05 DIAGNOSIS — Z0001 Encounter for general adult medical examination with abnormal findings: Secondary | ICD-10-CM | POA: Diagnosis not present

## 2020-04-05 DIAGNOSIS — F411 Generalized anxiety disorder: Secondary | ICD-10-CM | POA: Diagnosis not present

## 2020-04-05 DIAGNOSIS — R5383 Other fatigue: Secondary | ICD-10-CM | POA: Diagnosis not present

## 2020-04-19 DIAGNOSIS — Z0001 Encounter for general adult medical examination with abnormal findings: Secondary | ICD-10-CM | POA: Diagnosis not present

## 2020-04-26 DIAGNOSIS — E559 Vitamin D deficiency, unspecified: Secondary | ICD-10-CM | POA: Diagnosis not present

## 2020-04-26 DIAGNOSIS — Z0001 Encounter for general adult medical examination with abnormal findings: Secondary | ICD-10-CM | POA: Diagnosis not present

## 2020-05-02 ENCOUNTER — Emergency Department (HOSPITAL_COMMUNITY)
Admission: EM | Admit: 2020-05-02 | Discharge: 2020-05-02 | Discharge status: Absent without leave | Payer: BC Managed Care – PPO

## 2020-05-02 ENCOUNTER — Emergency Department (HOSPITAL_BASED_OUTPATIENT_CLINIC_OR_DEPARTMENT_OTHER): Payer: BC Managed Care – PPO

## 2020-05-02 ENCOUNTER — Inpatient Hospital Stay (HOSPITAL_COMMUNITY): Payer: BC Managed Care – PPO | Admitting: Anesthesiology

## 2020-05-02 ENCOUNTER — Inpatient Hospital Stay (HOSPITAL_BASED_OUTPATIENT_CLINIC_OR_DEPARTMENT_OTHER)
Admission: EM | Admit: 2020-05-02 | Discharge: 2020-05-12 | DRG: 853 | Disposition: A | Discharge status: Home Health | Payer: BC Managed Care – PPO | Attending: Internal Medicine | Admitting: Internal Medicine

## 2020-05-02 ENCOUNTER — Encounter (HOSPITAL_BASED_OUTPATIENT_CLINIC_OR_DEPARTMENT_OTHER): Payer: Self-pay | Admitting: *Deleted

## 2020-05-02 ENCOUNTER — Other Ambulatory Visit: Payer: Self-pay

## 2020-05-02 ENCOUNTER — Encounter (HOSPITAL_COMMUNITY)
Admission: EM | Disposition: A | Discharge status: Home Health | Payer: Self-pay | Source: Home / Self Care | Attending: Family Medicine

## 2020-05-02 DIAGNOSIS — Z79899 Other long term (current) drug therapy: Secondary | ICD-10-CM

## 2020-05-02 DIAGNOSIS — T797XXA Traumatic subcutaneous emphysema, initial encounter: Secondary | ICD-10-CM | POA: Diagnosis not present

## 2020-05-02 DIAGNOSIS — Z23 Encounter for immunization: Secondary | ICD-10-CM

## 2020-05-02 DIAGNOSIS — E871 Hypo-osmolality and hyponatremia: Secondary | ICD-10-CM | POA: Diagnosis present

## 2020-05-02 DIAGNOSIS — L03311 Cellulitis of abdominal wall: Secondary | ICD-10-CM | POA: Diagnosis present

## 2020-05-02 DIAGNOSIS — Z8249 Family history of ischemic heart disease and other diseases of the circulatory system: Secondary | ICD-10-CM

## 2020-05-02 DIAGNOSIS — Z6841 Body Mass Index (BMI) 40.0 and over, adult: Secondary | ICD-10-CM

## 2020-05-02 DIAGNOSIS — M726 Necrotizing fasciitis: Secondary | ICD-10-CM | POA: Diagnosis present

## 2020-05-02 DIAGNOSIS — L02214 Cutaneous abscess of groin: Secondary | ICD-10-CM | POA: Diagnosis not present

## 2020-05-02 DIAGNOSIS — E119 Type 2 diabetes mellitus without complications: Secondary | ICD-10-CM | POA: Diagnosis not present

## 2020-05-02 DIAGNOSIS — R509 Fever, unspecified: Secondary | ICD-10-CM | POA: Diagnosis not present

## 2020-05-02 DIAGNOSIS — R296 Repeated falls: Secondary | ICD-10-CM | POA: Diagnosis present

## 2020-05-02 DIAGNOSIS — B342 Coronavirus infection, unspecified: Secondary | ICD-10-CM

## 2020-05-02 DIAGNOSIS — Z833 Family history of diabetes mellitus: Secondary | ICD-10-CM

## 2020-05-02 DIAGNOSIS — L02215 Cutaneous abscess of perineum: Secondary | ICD-10-CM | POA: Diagnosis not present

## 2020-05-02 DIAGNOSIS — L02213 Cutaneous abscess of chest wall: Secondary | ICD-10-CM

## 2020-05-02 DIAGNOSIS — R6521 Severe sepsis with septic shock: Secondary | ICD-10-CM | POA: Diagnosis present

## 2020-05-02 DIAGNOSIS — L732 Hidradenitis suppurativa: Secondary | ICD-10-CM | POA: Diagnosis not present

## 2020-05-02 DIAGNOSIS — I10 Essential (primary) hypertension: Secondary | ICD-10-CM | POA: Diagnosis present

## 2020-05-02 DIAGNOSIS — A408 Other streptococcal sepsis: Secondary | ICD-10-CM | POA: Diagnosis not present

## 2020-05-02 DIAGNOSIS — T8189XA Other complications of procedures, not elsewhere classified, initial encounter: Secondary | ICD-10-CM | POA: Diagnosis not present

## 2020-05-02 DIAGNOSIS — Z794 Long term (current) use of insulin: Secondary | ICD-10-CM

## 2020-05-02 DIAGNOSIS — E876 Hypokalemia: Secondary | ICD-10-CM | POA: Diagnosis present

## 2020-05-02 DIAGNOSIS — R1031 Right lower quadrant pain: Secondary | ICD-10-CM | POA: Diagnosis not present

## 2020-05-02 DIAGNOSIS — Z9049 Acquired absence of other specified parts of digestive tract: Secondary | ICD-10-CM

## 2020-05-02 DIAGNOSIS — L02211 Cutaneous abscess of abdominal wall: Secondary | ICD-10-CM | POA: Diagnosis not present

## 2020-05-02 DIAGNOSIS — M793 Panniculitis, unspecified: Secondary | ICD-10-CM | POA: Diagnosis not present

## 2020-05-02 DIAGNOSIS — E66813 Obesity, class 3: Secondary | ICD-10-CM

## 2020-05-02 DIAGNOSIS — Z91041 Radiographic dye allergy status: Secondary | ICD-10-CM | POA: Diagnosis not present

## 2020-05-02 DIAGNOSIS — A419 Sepsis, unspecified organism: Secondary | ICD-10-CM | POA: Diagnosis not present

## 2020-05-02 DIAGNOSIS — U071 COVID-19: Secondary | ICD-10-CM | POA: Diagnosis not present

## 2020-05-02 DIAGNOSIS — Z9114 Patient's other noncompliance with medication regimen: Secondary | ICD-10-CM

## 2020-05-02 DIAGNOSIS — R05 Cough: Secondary | ICD-10-CM | POA: Diagnosis not present

## 2020-05-02 DIAGNOSIS — E1165 Type 2 diabetes mellitus with hyperglycemia: Secondary | ICD-10-CM | POA: Diagnosis not present

## 2020-05-02 DIAGNOSIS — R739 Hyperglycemia, unspecified: Secondary | ICD-10-CM | POA: Diagnosis not present

## 2020-05-02 DIAGNOSIS — N7093 Salpingitis and oophoritis, unspecified: Secondary | ICD-10-CM | POA: Diagnosis not present

## 2020-05-02 DIAGNOSIS — F17210 Nicotine dependence, cigarettes, uncomplicated: Secondary | ICD-10-CM | POA: Diagnosis present

## 2020-05-02 DIAGNOSIS — Z7984 Long term (current) use of oral hypoglycemic drugs: Secondary | ICD-10-CM

## 2020-05-02 DIAGNOSIS — B372 Candidiasis of skin and nail: Secondary | ICD-10-CM | POA: Diagnosis present

## 2020-05-02 DIAGNOSIS — D509 Iron deficiency anemia, unspecified: Secondary | ICD-10-CM | POA: Diagnosis present

## 2020-05-02 DIAGNOSIS — T8182XA Emphysema (subcutaneous) resulting from a procedure, initial encounter: Secondary | ICD-10-CM | POA: Diagnosis not present

## 2020-05-02 HISTORY — DX: Coronavirus infection, unspecified: B34.2

## 2020-05-02 HISTORY — PX: IRRIGATION AND DEBRIDEMENT ABSCESS: SHX5252

## 2020-05-02 LAB — URINALYSIS, ROUTINE W REFLEX MICROSCOPIC
Bilirubin Urine: NEGATIVE
Glucose, UA: >=500 mg/dL — AB
Ketones, ur: 15 mg/dL — AB
Nitrite: NEGATIVE
Protein, ur: 100 mg/dL — AB
Specific Gravity, Urine: 1.01 (ref 1.005–1.030)
pH: 6.5 (ref 5.0–8.0)

## 2020-05-02 LAB — CREATININE, SERUM
Creatinine, Ser: 0.63 mg/dL (ref 0.44–1.00)
GFR calc Af Amer: >60 mL/min (ref >60)
GFR calc non Af Amer: >60 mL/min (ref >60)

## 2020-05-02 LAB — GLUCOSE, CAPILLARY
Glucose-Capillary: 242 mg/dL — ABNORMAL HIGH (ref 70–99)
Glucose-Capillary: 223 mg/dL — ABNORMAL HIGH (ref 70–99)
Glucose-Capillary: 158 mg/dL — ABNORMAL HIGH (ref 70–99)
Glucose-Capillary: 143 mg/dL — ABNORMAL HIGH (ref 70–99)

## 2020-05-02 LAB — CBC WITH DIFFERENTIAL/PLATELET
Abs Immature Granulocytes: 0.58 10*3/uL — ABNORMAL HIGH (ref 0.00–0.07)
Basophils Absolute: 0.1 10*3/uL (ref 0.0–0.1)
Basophils Relative: 0 %
Eosinophils Absolute: 0 10*3/uL (ref 0.0–0.5)
Eosinophils Relative: 0 %
HCT: 35.1 % — ABNORMAL LOW (ref 36.0–46.0)
Hemoglobin: 11 g/dL — ABNORMAL LOW (ref 12.0–15.0)
Immature Granulocytes: 2 %
Lymphocytes Relative: 6 %
Lymphs Abs: 1.9 10*3/uL (ref 0.7–4.0)
MCH: 24.4 pg — ABNORMAL LOW (ref 26.0–34.0)
MCHC: 31.3 g/dL (ref 30.0–36.0)
MCV: 77.8 fL — ABNORMAL LOW (ref 80.0–100.0)
Monocytes Absolute: 2.6 10*3/uL — ABNORMAL HIGH (ref 0.1–1.0)
Monocytes Relative: 9 %
Neutro Abs: 25.5 10*3/uL — ABNORMAL HIGH (ref 1.7–7.7)
Neutrophils Relative %: 83 %
Platelets: 345 10*3/uL (ref 150–400)
RBC: 4.51 MIL/uL (ref 3.87–5.11)
RDW: 15.5 % (ref 11.5–15.5)
Smear Review: NORMAL
WBC: 30.7 10*3/uL — ABNORMAL HIGH (ref 4.0–10.5)
nRBC: 0 % (ref 0.0–0.2)

## 2020-05-02 LAB — COMPREHENSIVE METABOLIC PANEL
ALT: 16 U/L (ref 0–44)
AST: 19 U/L (ref 15–41)
Albumin: 3.1 g/dL — ABNORMAL LOW (ref 3.5–5.0)
Alkaline Phosphatase: 82 U/L (ref 38–126)
Anion gap: 11 (ref 5–15)
BUN: 5 mg/dL — ABNORMAL LOW (ref 6–20)
CO2: 18 mmol/L — ABNORMAL LOW (ref 22–32)
Calcium: 8.3 mg/dL — ABNORMAL LOW (ref 8.9–10.3)
Chloride: 100 mmol/L (ref 98–111)
Creatinine, Ser: 0.79 mg/dL (ref 0.44–1.00)
GFR calc Af Amer: >60 mL/min (ref >60)
GFR calc non Af Amer: >60 mL/min (ref >60)
Glucose, Bld: 342 mg/dL — ABNORMAL HIGH (ref 70–99)
Potassium: 3.6 mmol/L (ref 3.5–5.1)
Sodium: 129 mmol/L — ABNORMAL LOW (ref 135–145)
Total Bilirubin: 0.6 mg/dL (ref 0.3–1.2)
Total Protein: 7.6 g/dL (ref 6.5–8.1)

## 2020-05-02 LAB — CBC
HCT: 28.5 % — ABNORMAL LOW (ref 36.0–46.0)
Hemoglobin: 8.8 g/dL — ABNORMAL LOW (ref 12.0–15.0)
MCH: 24.6 pg — ABNORMAL LOW (ref 26.0–34.0)
MCHC: 30.9 g/dL (ref 30.0–36.0)
MCV: 79.6 fL — ABNORMAL LOW (ref 80.0–100.0)
Platelets: 365 10*3/uL (ref 150–400)
RBC: 3.58 MIL/uL — ABNORMAL LOW (ref 3.87–5.11)
RDW: 15.8 % — ABNORMAL HIGH (ref 11.5–15.5)
WBC: 41.5 10*3/uL — ABNORMAL HIGH (ref 4.0–10.5)
nRBC: 0 % (ref 0.0–0.2)

## 2020-05-02 LAB — HEMOGLOBIN A1C
Hgb A1c MFr Bld: 10.5 % — ABNORMAL HIGH (ref 4.8–5.6)
Hgb A1c MFr Bld: 10.4 % — ABNORMAL HIGH (ref 4.8–5.6)
Mean Plasma Glucose: 254.65 mg/dL
Mean Plasma Glucose: 251.78 mg/dL

## 2020-05-02 LAB — URINALYSIS, MICROSCOPIC (REFLEX)

## 2020-05-02 LAB — C-REACTIVE PROTEIN: CRP: 22.7 mg/dL — ABNORMAL HIGH (ref <1.0)

## 2020-05-02 LAB — CBG MONITORING, ED
Glucose-Capillary: 301 mg/dL — ABNORMAL HIGH (ref 70–99)
Glucose-Capillary: 238 mg/dL — ABNORMAL HIGH (ref 70–99)

## 2020-05-02 LAB — MAGNESIUM: Magnesium: 1.8 mg/dL (ref 1.7–2.4)

## 2020-05-02 LAB — LACTIC ACID, PLASMA: Lactic Acid, Venous: 1.9 mmol/L (ref 0.5–1.9)

## 2020-05-02 LAB — PREGNANCY, URINE: Preg Test, Ur: NEGATIVE

## 2020-05-02 LAB — SARS CORONAVIRUS 2 BY RT PCR (HOSPITAL ORDER, PERFORMED IN ~~LOC~~ HOSPITAL LAB): SARS Coronavirus 2: POSITIVE — AB

## 2020-05-02 LAB — SEDIMENTATION RATE: Sed Rate: 84 mm/hr — ABNORMAL HIGH (ref 0–22)

## 2020-05-02 SURGERY — IRRIGATION AND DEBRIDEMENT ABSCESS
Anesthesia: General | Site: Abdomen

## 2020-05-02 MED ORDER — MORPHINE SULFATE (PF) 4 MG/ML IV SOLN
4.0000 mg | Freq: Once | INTRAVENOUS | Status: AC
  Administered 2020-05-02: 4 mg via INTRAVENOUS
  Filled 2020-05-02: qty 1

## 2020-05-02 MED ORDER — MORPHINE SULFATE (PF) 4 MG/ML IV SOLN: 4.0000 mg | Freq: Once | INTRAVENOUS | Status: AC

## 2020-05-02 MED ORDER — ONDANSETRON HCL 4 MG/2ML IJ SOLN
4.0000 mg | Freq: Four times a day (QID) | INTRAMUSCULAR | Status: DC | PRN
  Administered 2020-05-05 (×2): 4 mg via INTRAVENOUS
  Filled 2020-05-02 (×2): qty 2

## 2020-05-02 MED ORDER — INSULIN ASPART 100 UNIT/ML ~~LOC~~ SOLN
0.0000 [IU] | SUBCUTANEOUS | Status: DC
  Administered 2020-05-02 (×2): 4 [IU] via SUBCUTANEOUS
  Administered 2020-05-02: 3 [IU] via SUBCUTANEOUS
  Administered 2020-05-03: 4 [IU] via SUBCUTANEOUS
  Administered 2020-05-03: 7 [IU] via SUBCUTANEOUS
  Administered 2020-05-03 – 2020-05-04 (×3): 4 [IU] via SUBCUTANEOUS
  Administered 2020-05-04: 7 [IU] via SUBCUTANEOUS
  Administered 2020-05-04: 3 [IU] via SUBCUTANEOUS
  Administered 2020-05-04: 11 [IU] via SUBCUTANEOUS
  Administered 2020-05-04: 4 [IU] via SUBCUTANEOUS
  Administered 2020-05-04: 3 [IU] via SUBCUTANEOUS
  Administered 2020-05-05 (×2): 4 [IU] via SUBCUTANEOUS
  Administered 2020-05-05: 3 [IU] via SUBCUTANEOUS
  Administered 2020-05-05: 7 [IU] via SUBCUTANEOUS
  Administered 2020-05-05: 4 [IU] via SUBCUTANEOUS
  Administered 2020-05-05: 3 [IU] via SUBCUTANEOUS
  Administered 2020-05-06: 4 [IU] via SUBCUTANEOUS
  Administered 2020-05-06: 7 [IU] via SUBCUTANEOUS
  Administered 2020-05-06 (×2): 4 [IU] via SUBCUTANEOUS

## 2020-05-02 MED ORDER — INSULIN ASPART 100 UNIT/ML ~~LOC~~ SOLN
5.0000 [IU] | Freq: Once | SUBCUTANEOUS | Status: AC
  Administered 2020-05-02: 5 [IU] via SUBCUTANEOUS

## 2020-05-02 MED ORDER — LIP MEDEX EX OINT
1.0000 "application " | TOPICAL_OINTMENT | Freq: Two times a day (BID) | CUTANEOUS | Status: DC
  Administered 2020-05-02 – 2020-05-12 (×17): 1 via TOPICAL
  Filled 2020-05-02 (×3): qty 7

## 2020-05-02 MED ORDER — PIPERACILLIN-TAZOBACTAM 3.375 G IVPB 30 MIN
3.3750 g | Freq: Once | INTRAVENOUS | Status: AC
  Administered 2020-05-02: 3.375 g via INTRAVENOUS
  Filled 2020-05-02 (×2): qty 50

## 2020-05-02 MED ORDER — ONDANSETRON HCL 4 MG/2ML IJ SOLN
INTRAMUSCULAR | Status: DC | PRN
  Administered 2020-05-02: 4 mg via INTRAVENOUS

## 2020-05-02 MED ORDER — PROPOFOL 10 MG/ML IV BOLUS
INTRAVENOUS | Status: DC | PRN
  Administered 2020-05-02: 200 mg via INTRAVENOUS

## 2020-05-02 MED ORDER — POLYETHYLENE GLYCOL 3350 17 G PO PACK: 17.0000 g | PACK | Freq: Every day | ORAL | Status: DC | PRN

## 2020-05-02 MED ORDER — SUGAMMADEX SODIUM 200 MG/2ML IV SOLN
INTRAVENOUS | Status: DC | PRN
  Administered 2020-05-02: 257.6 mg via INTRAVENOUS

## 2020-05-02 MED ORDER — ACETAMINOPHEN 325 MG PO TABS: 650.0000 mg | ORAL_TABLET | Freq: Four times a day (QID) | ORAL | Status: DC | PRN

## 2020-05-02 MED ORDER — PROPOFOL 10 MG/ML IV BOLUS
INTRAVENOUS | Status: AC
  Filled 2020-05-02: qty 40

## 2020-05-02 MED ORDER — MORPHINE SULFATE (PF) 4 MG/ML IV SOLN
INTRAVENOUS | Status: AC
  Administered 2020-05-02: 4 mg via INTRAVENOUS
  Filled 2020-05-02: qty 1

## 2020-05-02 MED ORDER — FENTANYL CITRATE (PF) 100 MCG/2ML IJ SOLN
INTRAMUSCULAR | Status: AC
  Filled 2020-05-02: qty 2

## 2020-05-02 MED ORDER — ENOXAPARIN SODIUM 40 MG/0.4ML ~~LOC~~ SOLN
40.0000 mg | SUBCUTANEOUS | Status: DC
  Administered 2020-05-03 – 2020-05-12 (×10): 40 mg via SUBCUTANEOUS
  Filled 2020-05-02 (×10): qty 0.4

## 2020-05-02 MED ORDER — LACTATED RINGERS IV BOLUS: 1000.0000 mL | Freq: Three times a day (TID) | INTRAVENOUS | Status: AC | PRN

## 2020-05-02 MED ORDER — FENTANYL CITRATE (PF) 100 MCG/2ML IJ SOLN
25.0000 ug | INTRAMUSCULAR | Status: DC | PRN
  Administered 2020-05-02: 50 ug via INTRAVENOUS

## 2020-05-02 MED ORDER — SODIUM CHLORIDE 0.9 % IV SOLN
1.0000 g | Freq: Three times a day (TID) | INTRAVENOUS | Status: DC
  Administered 2020-05-02: 1 g via INTRAVENOUS
  Filled 2020-05-02 (×2): qty 1

## 2020-05-02 MED ORDER — ROCURONIUM BROMIDE 10 MG/ML (PF) SYRINGE
PREFILLED_SYRINGE | INTRAVENOUS | Status: AC
  Filled 2020-05-02: qty 10

## 2020-05-02 MED ORDER — CLINDAMYCIN PHOSPHATE 600 MG/50ML IV SOLN
600.0000 mg | Freq: Once | INTRAVENOUS | Status: AC
  Administered 2020-05-02: 600 mg via INTRAVENOUS
  Filled 2020-05-02: qty 50

## 2020-05-02 MED ORDER — DIPHENHYDRAMINE HCL 50 MG/ML IJ SOLN
INTRAMUSCULAR | Status: AC
  Administered 2020-05-02: 25 mg via INTRAVENOUS
  Filled 2020-05-02: qty 1

## 2020-05-02 MED ORDER — VANCOMYCIN HCL 1250 MG/250ML IV SOLN
1250.0000 mg | Freq: Three times a day (TID) | INTRAVENOUS | Status: DC
  Administered 2020-05-02 – 2020-05-04 (×6): 1250 mg via INTRAVENOUS
  Filled 2020-05-02 (×8): qty 250

## 2020-05-02 MED ORDER — SUGAMMADEX SODIUM 500 MG/5ML IV SOLN
INTRAVENOUS | Status: AC
  Filled 2020-05-02: qty 5

## 2020-05-02 MED ORDER — INSULIN DETEMIR 100 UNIT/ML ~~LOC~~ SOLN
20.0000 [IU] | Freq: Two times a day (BID) | SUBCUTANEOUS | Status: DC
  Administered 2020-05-02 – 2020-05-06 (×8): 20 [IU] via SUBCUTANEOUS
  Filled 2020-05-02 (×9): qty 0.2

## 2020-05-02 MED ORDER — VANCOMYCIN HCL 1500 MG/300ML IV SOLN
1500.0000 mg | INTRAVENOUS | Status: DC
  Filled 2020-05-02: qty 300

## 2020-05-02 MED ORDER — 0.9 % SODIUM CHLORIDE (POUR BTL) OPTIME
TOPICAL | Status: DC | PRN
  Administered 2020-05-02: 1000 mL

## 2020-05-02 MED ORDER — PROMETHAZINE HCL 25 MG/ML IJ SOLN
INTRAMUSCULAR | Status: AC
  Filled 2020-05-02: qty 1

## 2020-05-02 MED ORDER — ORAL CARE MOUTH RINSE
15.0000 mL | Freq: Two times a day (BID) | OROMUCOSAL | Status: DC
  Administered 2020-05-02 – 2020-05-12 (×16): 15 mL via OROMUCOSAL

## 2020-05-02 MED ORDER — INSULIN DETEMIR 100 UNIT/ML ~~LOC~~ SOLN
20.0000 [IU] | Freq: Once | SUBCUTANEOUS | Status: AC
  Administered 2020-05-02: 20 [IU] via SUBCUTANEOUS
  Filled 2020-05-02: qty 0.2

## 2020-05-02 MED ORDER — INSULIN ASPART 100 UNIT/ML ~~LOC~~ SOLN
SUBCUTANEOUS | Status: AC
  Filled 2020-05-02: qty 1

## 2020-05-02 MED ORDER — PROMETHAZINE HCL 25 MG/ML IJ SOLN: 6.2500 mg | INTRAMUSCULAR | Status: DC | PRN

## 2020-05-02 MED ORDER — VANCOMYCIN HCL 1250 MG/250ML IV SOLN
1250.0000 mg | Freq: Two times a day (BID) | INTRAVENOUS | Status: DC
  Filled 2020-05-02: qty 250

## 2020-05-02 MED ORDER — INSULIN ASPART 100 UNIT/ML ~~LOC~~ SOLN
0.0000 [IU] | SUBCUTANEOUS | Status: DC
  Administered 2020-05-02: 11 [IU] via SUBCUTANEOUS
  Administered 2020-05-02: 5 [IU] via SUBCUTANEOUS
  Filled 2020-05-02: qty 11

## 2020-05-02 MED ORDER — SODIUM CHLORIDE 0.9 % IV BOLUS
500.0000 mL | Freq: Once | INTRAVENOUS | Status: AC
  Administered 2020-05-02: 500 mL via INTRAVENOUS

## 2020-05-02 MED ORDER — CLINDAMYCIN PHOSPHATE 900 MG/50ML IV SOLN: 900.0000 mg | INTRAVENOUS | Status: DC

## 2020-05-02 MED ORDER — LABETALOL HCL 5 MG/ML IV SOLN: 10.0000 mg | Freq: Once | INTRAVENOUS | Status: DC

## 2020-05-02 MED ORDER — GABAPENTIN 300 MG PO CAPS: 300.0000 mg | ORAL_CAPSULE | ORAL | Status: DC

## 2020-05-02 MED ORDER — GABAPENTIN 300 MG PO CAPS
300.0000 mg | ORAL_CAPSULE | Freq: Two times a day (BID) | ORAL | Status: DC
  Administered 2020-05-02: 300 mg via ORAL
  Filled 2020-05-02: qty 1

## 2020-05-02 MED ORDER — FLUCONAZOLE 100MG IVPB: 100.0000 mg | INTRAVENOUS | Status: DC

## 2020-05-02 MED ORDER — DIPHENHYDRAMINE HCL 50 MG/ML IJ SOLN: 12.5000 mg | Freq: Four times a day (QID) | INTRAMUSCULAR | Status: DC | PRN

## 2020-05-02 MED ORDER — CHLORHEXIDINE GLUCONATE CLOTH 2 % EX PADS
6.0000 | MEDICATED_PAD | Freq: Every day | CUTANEOUS | Status: DC
  Administered 2020-05-02 – 2020-05-12 (×9): 6 via TOPICAL

## 2020-05-02 MED ORDER — OXYCODONE HCL 5 MG PO TABS
5.0000 mg | ORAL_TABLET | ORAL | Status: DC | PRN
  Administered 2020-05-04: 5 mg via ORAL
  Administered 2020-05-05 – 2020-05-08 (×6): 10 mg via ORAL
  Administered 2020-05-09: 5 mg via ORAL
  Administered 2020-05-09 – 2020-05-10 (×3): 10 mg via ORAL
  Filled 2020-05-02 (×4): qty 2
  Filled 2020-05-02: qty 1
  Filled 2020-05-02 (×3): qty 2
  Filled 2020-05-02: qty 1
  Filled 2020-05-02 (×3): qty 2

## 2020-05-02 MED ORDER — SUCCINYLCHOLINE CHLORIDE 200 MG/10ML IV SOSY
PREFILLED_SYRINGE | INTRAVENOUS | Status: DC | PRN
  Administered 2020-05-02: 120 mg via INTRAVENOUS

## 2020-05-02 MED ORDER — PSYLLIUM 95 % PO PACK
1.0000 | PACK | Freq: Two times a day (BID) | ORAL | Status: DC
  Administered 2020-05-02 – 2020-05-11 (×12): 1 via ORAL
  Filled 2020-05-02 (×18): qty 1

## 2020-05-02 MED ORDER — LABETALOL HCL 5 MG/ML IV SOLN
INTRAVENOUS | Status: AC
  Filled 2020-05-02: qty 4

## 2020-05-02 MED ORDER — ONDANSETRON HCL 4 MG/2ML IJ SOLN
4.0000 mg | Freq: Once | INTRAMUSCULAR | Status: AC
  Administered 2020-05-02: 4 mg via INTRAVENOUS
  Filled 2020-05-02: qty 2

## 2020-05-02 MED ORDER — ONDANSETRON HCL 4 MG/2ML IJ SOLN
INTRAMUSCULAR | Status: AC
  Filled 2020-05-02: qty 2

## 2020-05-02 MED ORDER — DIPHENHYDRAMINE HCL 50 MG/ML IJ SOLN: 25.0000 mg | Freq: Once | INTRAMUSCULAR | Status: AC

## 2020-05-02 MED ORDER — SODIUM CHLORIDE 0.9 % IV SOLN: INTRAVENOUS | Status: DC | PRN

## 2020-05-02 MED ORDER — LACTATED RINGERS IV BOLUS (SEPSIS)
1000.0000 mL | Freq: Once | INTRAVENOUS | Status: AC
  Administered 2020-05-02: 1000 mL via INTRAVENOUS

## 2020-05-02 MED ORDER — VANCOMYCIN HCL IN DEXTROSE 1-5 GM/200ML-% IV SOLN: 1000.0000 mg | Freq: Once | INTRAVENOUS | Status: DC

## 2020-05-02 MED ORDER — FLUCONAZOLE IN SODIUM CHLORIDE 200-0.9 MG/100ML-% IV SOLN
200.0000 mg | Freq: Once | INTRAVENOUS | Status: AC
  Administered 2020-05-02: 200 mg via INTRAVENOUS
  Filled 2020-05-02: qty 100

## 2020-05-02 MED ORDER — LIDOCAINE 2% (20 MG/ML) 5 ML SYRINGE
INTRAMUSCULAR | Status: AC
  Filled 2020-05-02: qty 5

## 2020-05-02 MED ORDER — BISACODYL 10 MG RE SUPP: 10.0000 mg | Freq: Two times a day (BID) | RECTAL | Status: DC | PRN

## 2020-05-02 MED ORDER — VANCOMYCIN HCL 1000 MG IV SOLR
INTRAVENOUS | Status: AC
  Administered 2020-05-02: 2000 mg
  Filled 2020-05-02: qty 2000

## 2020-05-02 MED ORDER — SODIUM CHLORIDE 0.9 % IV SOLN
INTRAVENOUS | Status: DC | PRN
  Administered 2020-05-02: 1000 mL via INTRAVENOUS

## 2020-05-02 MED ORDER — ONDANSETRON HCL 4 MG PO TABS: 4.0000 mg | ORAL_TABLET | Freq: Four times a day (QID) | ORAL | Status: DC | PRN

## 2020-05-02 MED ORDER — ROCURONIUM BROMIDE 10 MG/ML (PF) SYRINGE
PREFILLED_SYRINGE | INTRAVENOUS | Status: DC | PRN
  Administered 2020-05-02: 50 mg via INTRAVENOUS

## 2020-05-02 MED ORDER — LIDOCAINE 2% (20 MG/ML) 5 ML SYRINGE
INTRAMUSCULAR | Status: DC | PRN
  Administered 2020-05-02: 100 mg via INTRAVENOUS

## 2020-05-02 MED ORDER — MAGIC MOUTHWASH
15.0000 mL | Freq: Four times a day (QID) | ORAL | Status: DC | PRN
  Administered 2020-05-05: 15 mL via ORAL
  Filled 2020-05-02 (×2): qty 15

## 2020-05-02 MED ORDER — HYDROMORPHONE HCL 1 MG/ML IJ SOLN
0.5000 mg | INTRAMUSCULAR | Status: DC | PRN
  Administered 2020-05-02 – 2020-05-03 (×5): 1 mg via INTRAVENOUS
  Administered 2020-05-04: 2 mg via INTRAVENOUS
  Administered 2020-05-04 – 2020-05-12 (×6): 1 mg via INTRAVENOUS
  Filled 2020-05-02 (×6): qty 1
  Filled 2020-05-02: qty 2
  Filled 2020-05-02 (×6): qty 1
  Filled 2020-05-02: qty 2
  Filled 2020-05-02 (×2): qty 1

## 2020-05-02 MED ORDER — METRONIDAZOLE IN NACL 5-0.79 MG/ML-% IV SOLN
500.0000 mg | Freq: Three times a day (TID) | INTRAVENOUS | Status: DC
  Administered 2020-05-02 – 2020-05-03 (×2): 500 mg via INTRAVENOUS
  Filled 2020-05-02 (×2): qty 100

## 2020-05-02 MED ORDER — FENTANYL CITRATE (PF) 250 MCG/5ML IJ SOLN
INTRAMUSCULAR | Status: DC | PRN
  Administered 2020-05-02 (×4): 50 ug via INTRAVENOUS

## 2020-05-02 MED ORDER — LACTATED RINGERS IV SOLN: INTRAVENOUS | Status: DC | PRN

## 2020-05-02 MED ORDER — VANCOMYCIN HCL 2000 MG/400ML IV SOLN
2000.0000 mg | Freq: Once | INTRAVENOUS | Status: DC
  Filled 2020-05-02: qty 400

## 2020-05-02 MED ORDER — FENTANYL CITRATE (PF) 250 MCG/5ML IJ SOLN
INTRAMUSCULAR | Status: AC
  Filled 2020-05-02: qty 5

## 2020-05-02 MED ORDER — CHLORHEXIDINE GLUCONATE CLOTH 2 % EX PADS: 6.0000 | MEDICATED_PAD | Freq: Once | CUTANEOUS | Status: DC

## 2020-05-02 MED ORDER — ACETAMINOPHEN 500 MG PO TABS
1000.0000 mg | ORAL_TABLET | Freq: Three times a day (TID) | ORAL | Status: DC
  Administered 2020-05-02 – 2020-05-12 (×30): 1000 mg via ORAL
  Filled 2020-05-02 (×30): qty 2

## 2020-05-02 MED ORDER — MIDAZOLAM HCL 2 MG/2ML IJ SOLN
INTRAMUSCULAR | Status: AC
  Filled 2020-05-02: qty 2

## 2020-05-02 MED ORDER — ACETAMINOPHEN 500 MG PO TABS: 1000.0000 mg | ORAL_TABLET | ORAL | Status: DC

## 2020-05-02 MED ORDER — ACETAMINOPHEN 650 MG RE SUPP: 650.0000 mg | Freq: Four times a day (QID) | RECTAL | Status: DC | PRN

## 2020-05-02 MED ORDER — ONDANSETRON HCL 4 MG/2ML IJ SOLN
4.0000 mg | Freq: Once | INTRAMUSCULAR | Status: AC
  Administered 2020-05-02: 4 mg via INTRAVENOUS

## 2020-05-02 SURGICAL SUPPLY — 27 items
BLADE SURG 15 STRL LF DISP TIS (BLADE) ×1 IMPLANT
BLADE SURG 15 STRL SS (BLADE) ×2
BNDG GAUZE ELAST 4 BULKY (GAUZE/BANDAGES/DRESSINGS) ×12 IMPLANT
BRIEF STRETCH FOR OB PAD LRG (UNDERPADS AND DIAPERS) IMPLANT
COVER SURGICAL LIGHT HANDLE (MISCELLANEOUS) ×3 IMPLANT
COVER WAND RF STERILE (DRAPES) IMPLANT
DRAPE LAPAROTOMY T 102X78X121 (DRAPES) ×3 IMPLANT
DRSG PAD ABDOMINAL 8X10 ST (GAUZE/BANDAGES/DRESSINGS) ×9 IMPLANT
ELECT REM PT RETURN 15FT ADLT (MISCELLANEOUS) ×3 IMPLANT
GAUZE 4X4 16PLY RFD (DISPOSABLE) ×3 IMPLANT
GAUZE SPONGE 4X4 12PLY STRL (GAUZE/BANDAGES/DRESSINGS) IMPLANT
GLOVE ECLIPSE 8.0 STRL XLNG CF (GLOVE) ×3 IMPLANT
GLOVE INDICATOR 8.0 STRL GRN (GLOVE) ×3 IMPLANT
GOWN STRL REUS W/TWL XL LVL3 (GOWN DISPOSABLE) ×6 IMPLANT
KIT BASIN OR (CUSTOM PROCEDURE TRAY) ×3 IMPLANT
KIT TURNOVER KIT A (KITS) IMPLANT
NEEDLE HYPO 22GX1.5 SAFETY (NEEDLE) ×3 IMPLANT
PACK BASIC VI WITH GOWN DISP (CUSTOM PROCEDURE TRAY) ×3 IMPLANT
PENCIL SMOKE EVACUATOR (MISCELLANEOUS) IMPLANT
SURGILUBE 2OZ TUBE FLIPTOP (MISCELLANEOUS) ×3 IMPLANT
SYR 20ML LL LF (SYRINGE) ×3 IMPLANT
SYR BULB IRRIG 60ML STRL (SYRINGE) ×3 IMPLANT
TAPE CLOTH SURG 4X10 WHT LF (GAUZE/BANDAGES/DRESSINGS) ×6 IMPLANT
TAPE CLOTH SURG 6X10 WHT LF (GAUZE/BANDAGES/DRESSINGS) ×3 IMPLANT
TOWEL OR 17X26 10 PK STRL BLUE (TOWEL DISPOSABLE) ×3 IMPLANT
TOWEL OR NON WOVEN STRL DISP B (DISPOSABLE) ×3 IMPLANT
YANKAUER SUCT BULB TIP 10FT TU (MISCELLANEOUS) ×3 IMPLANT

## 2020-05-02 NOTE — ED Notes (Signed)
Pt lying prone on stretcher, moaning, unable to hold still for more than a few minutes. States the pain is the worst when prone? Hyperventilating and c/o sharp pain. Breathes at normal rate when texting on cell phone.

## 2020-05-02 NOTE — Anesthesia Preprocedure Evaluation (Addendum)
Anesthesia Evaluation  Patient identified by MRN, date of birth, ID band Patient awake    Reviewed: Allergy & Precautions, NPO status , Patient's Chart, lab work & pertinent test resultsPreop documentation limited or incomplete due to emergent nature of procedure.  Airway Mallampati: III  TM Distance: >3 FB Neck ROM: Full    Dental  (+) Teeth Intact, Dental Advisory Given   Pulmonary Current Smoker and Patient abstained from smoking.,  COVID POSITIVE    Pulmonary exam normal breath sounds clear to auscultation       Cardiovascular hypertension, Pt. on medications Normal cardiovascular exam Rhythm:Regular Rate:Tachycardia     Neuro/Psych negative neurological ROS  negative psych ROS   GI/Hepatic negative GI ROS, Neg liver ROS, panniculitis with necrotizing fascitis   Endo/Other  diabetes, Poorly Controlled, Type 2, Oral Hypoglycemic AgentsMorbid obesity  Renal/GU negative Renal ROS     Musculoskeletal negative musculoskeletal ROS (+)   Abdominal   Peds  Hematology  (+) Blood dyscrasia, anemia ,   Anesthesia Other Findings   Reproductive/Obstetrics                            Anesthesia Physical Anesthesia Plan  ASA: III and emergent  Anesthesia Plan: General   Post-op Pain Management:    Induction: Intravenous, Rapid sequence and Cricoid pressure planned  PONV Risk Score and Plan: 3 and Midazolam, Dexamethasone and Ondansetron  Airway Management Planned: Oral ETT and Video Laryngoscope Planned  Additional Equipment:   Intra-op Plan:   Post-operative Plan: Extubation in OR  Informed Consent: I have reviewed the patients History and Physical, chart, labs and discussed the procedure including the risks, benefits and alternatives for the proposed anesthesia with the patient or authorized representative who has indicated his/her understanding and acceptance.     Only emergency  history available  Plan Discussed with: CRNA  Anesthesia Plan Comments: (2nd IV, possible arterial line )       Anesthesia Quick Evaluation

## 2020-05-02 NOTE — Consult Note (Signed)
Carolyn Zimmerman  Mar 12, 1986 [id number]  CARE TEAM:  PCP: System, Pcp Not In  Outpatient Care Team: Patient Care Team: System, Pcp Not In as PCP - General  Inpatient Treatment Team: Treatment Team: Attending Provider: Charlynne Cousins, MD; Consulting Physician: Nolon Nations, MD; Registered Nurse: Bowen, Latricia Heft, RN; Rounding Team: Jackelyn Knife, MD; Technician: Wonda Horner, NT   This patient is a 34 y.o.female who presents today for surgical evaluation at the request of Dr Nanda Quinton, [facility] ED.   Chief complaint / Reason for evaluation: Abdominal pain with gas.  H/o soft tissue infections  Obese woman with history of diabetes poorly controlled.  Smoking.  History of prior soft tissue infection.  Had to have a chest wall abscess drained 3 years ago.  Has had worsening pain and discomfort right lower side.  Exam showing redness and cellulitis.  Febrile with elevated white count.  CT scan shows large gas pocket abdomen suspicious for abscess possible fasciitis.  Covid positive.  Surgical consultation requested.  Medicine evaluating.  Glucose is in 300s.  Patient notes her chest wall wound never fully closed and intermittently opens up.  Sometimes has some irritations in her armpits.  She does not know she has MRSA.  She is not on blood thinners.   Assessment  Carolyn Zimmerman  33 y.o. female      Problem List:  Principal Problem:   Abdominal wall abscess Active Problems:   Hyperglycemia   Hx of medication noncompliance   Uncontrolled hypertension   Type 2 diabetes mellitus with hyperglycemia (HCC)   Obesity, Class III, BMI 40-49.9 (morbid obesity) ([place])   COVID-19 virus infection   Necrotizing fasciitis (HCC)   Sepsis (HCC)   Leukocytosis with fever and large gas pocket in appendicular is suspicious for possible really fasciitis.  The very least abscess.  Chest wall abscesses/chronic wounds possibly has sores.  Question of history of hidradenitis in the inframammary  folds and possibly axillary  Plan:  IV antibiotics.  Evaluate for MRSA.  Operative exploration and debridement.  Unroofing and packing.  Might need repeated debridements if having necrotizing fasciitis.  The anatomy and physiology of the region was discussed. The pathophysiology of subcutaneous abscess formation with progression to fasciitis & sepsis was discussed.  Need for incision, drainage, debridement discussed.  I stressed good hygiene & need for repeated wound care.  Possible redebridement / reoperation was discussed as well. Possibility of recurrence was discussed.   Risks of bleeding, infection, abscess, leak, injury to other organs, need for repair of tissues / organs, need for further treatment, heart attack, death, and other risks were discussed.  Benefits, alternatives were discussed. I noted a good likelihood this will help address the problem.  Questions answered.  The patient agrees to proceed.   Broad IV antibiotics.  Would treat for MRSA.  Vancomycin and Zosyn usually standard.  Defer to medicine and evaluate based on findings.  Probably need to explore to open up the chest wall wound to make sure it is draining better and heal up since it is probably a chronic colonized wound that allows other areas to be infected.  Glucose control.  Hyperglycemic.  Hemoglobin is greater than 10 consistent with poorly controlled diabetes.  Suspect she will be insulin requiring at this point.  Defer to medicine  Blood pressure control.  -VTE prophylaxis- SCDs, etc -mobilize as tolerated to help recovery  30 minutes spent in review, evaluation, examination, counseling, and coordination of care.  More  than 50% of that time was spent in counseling.  Adin Hector, MD, FACS, MASCRS Gastrointestinal and Minimally Invasive Surgery  [facility] [address], [place], [place] [address] [phone] Fax [phone] Main/Paging  CONTACT  INFORMATION: Weekday (9AM-5PM) concerns: Call [facility] main office at [phone] Weeknight (5PM-9AM) or Weekend/Holiday concerns: Check [email or web address] for General Surgery [facility] coverage (Please, do not use SecureChat as it is not reliable communication to operating surgeons for immediate patient care)      05/02/2020      Past Medical History:  Diagnosis Date  . Abscess of chest wall 06/15/2017  . Diabetes mellitus   . Hypertension   . Jaundice   . Obesity   . TOA (tubo-ovarian abscess) 09/2019    Past Surgical History:  Procedure Laterality Date  . CHOLECYSTECTOMY    . liver stent      Social History   Socioeconomic History  . Marital status: Single    Spouse name: Not on file  . Number of children: Not on file  . Years of education: Not on file  . Highest education level: Not on file  Occupational History  . Not on file  Tobacco Use  . Smoking status: Current Some Day Smoker    Types: Cigarettes  . Smokeless tobacco: Never Used  Vaping Use  . Vaping Use: Former  Substance and Sexual Activity  . Alcohol use: Not Currently    Comment: occ  . Drug use: No  . Sexual activity: Not on file  Other Topics Concern  . Not on file  Social History Narrative  . Not on file   Social Determinants of Health   Financial Resource Strain:   . Difficulty of Paying Living Expenses:   Food Insecurity:   . Worried About Charity fundraiser in the Last Year:   . Arboriculturist in the Last Year:   Transportation Needs:   . Film/video editor (Medical):   Marland Kitchen Lack of Transportation (Non-Medical):   Physical Activity:   . Days of Exercise per Week:   . Minutes of Exercise per Session:   Stress:   . Feeling of Stress :   Social Connections:   . Frequency of Communication with Friends and Family:   . Frequency of Social Gatherings with Friends and Family:   . Attends Religious Services:   . Active Member of Clubs or Organizations:   . Attends Archivist Meetings:    Marland Kitchen Marital Status:   Intimate Partner Violence:   . Fear of Current or Ex-Partner:   . Emotionally Abused:   Marland Kitchen Physically Abused:   . Sexually Abused:     Family History  Problem Relation Age of Onset  . Hypertension Mother   . Diabetes Father   . Hypertension Father     Current Facility-Administered Medications  Medication Dose Route Frequency Provider Last Rate Last Admin  . 0.9 %  sodium chloride infusion   Intravenous PRN Margette Fast, MD 10 mL/hr at 05/02/20 0603 New Bag at 05/02/20 0603  . 0.9 %  sodium chloride infusion   Intravenous PRN Margette Fast, MD 10 mL/hr at 05/02/20 0609 1,000 mL at 05/02/20 0609  . acetaminophen (TYLENOL) tablet 1,000 mg  1,000 mg Oral On Call to OR Michael Boston, MD      . Chlorhexidine Gluconate Cloth 2 % PADS 6 each  6 each Topical Once Michael Boston, MD      . clindamycin (  CLEOCIN) IVPB 900 mg  900 mg Intravenous On Call to OR Karie Soda, MD      . gabapentin (NEURONTIN) capsule 300 mg  300 mg Oral On Call to OR Karie Soda, MD      . insulin aspart (novoLOG) injection 0-15 Units  0-15 Units Subcutaneous Malachi Pro, MD   11 Units at 05/02/20 [phone]  . insulin detemir (LEVEMIR) injection 20 Units  20 Units Subcutaneous Once David Stall, Darin Engels, MD      . morphine 4 MG/ML injection 4 mg  4 mg Intravenous Once David Stall, Darin Engels, MD      . promethazine (PHENERGAN) 25 MG/ML injection           . vancomycin (VANCOREADY) IVPB 1250 mg/250 mL  1,250 mg Intravenous Q12H Stevphen Rochester, RPH      . vancomycin (VANCOREADY) IVPB 2000 mg/400 mL  2,000 mg Intravenous Once Stevphen Rochester, [facility]       Current Outpatient Medications  Medication Sig Dispense Refill  . amLODipine (NORVASC) 5 MG tablet Take 1 tablet (5 mg total) by mouth daily. 30 tablet 0  . metFORMIN (GLUCOPHAGE-XR) 500 MG 24 hr tablet Take 500 mg by mouth 3 (three) times daily.    . naproxen sodium (ALEVE) 220 MG tablet Take 220 mg by mouth daily as needed (pain).    .  Phenylephrine-Pheniramine-DM (THERAFLU COLD & COUGH PO) Take 30 mLs by mouth daily as needed (cold and cough).    Marland Kitchen albuterol (PROVENTIL HFA;VENTOLIN HFA) 108 (90 Base) MCG/ACT inhaler Inhale 2 puffs into the lungs every 4 (four) hours as needed for wheezing or shortness of breath. (Patient not taking: Reported on 10/18/2019) 1 Inhaler 0  . blood glucose meter kit and supplies Dispense based on patient and insurance preference. Use up to four times daily as directed. (FOR ICD-9 250.00, 250.01). 1 each 0  . hydrochlorothiazide (HYDRODIURIL) 25 MG tablet Take 1 tablet (25 mg total) by mouth daily. (Patient not taking: Reported on 10/18/2019) 30 tablet 0  . ibuprofen (ADVIL) 600 MG tablet Take 1 tablet (600 mg total) by mouth every 6 (six) hours as needed (mild pain). (Patient not taking: Reported on 05/02/2020) 30 tablet 3  . insulin aspart protamine- aspart (NOVOLOG MIX 70/30) (70-30) 100 UNIT/ML injection Inject 0.25 mLs (25 Units total) into the skin 2 (two) times daily with a meal. (Patient not taking: Reported on 10/18/2019) 15 mL 0  . Insulin Syringe-Needle U-100 (INSULIN SYRINGE .3CC/31GX5/16") 31G X 5/16" 0.3 ML MISC Use with insulin, twice daily 90 each 0  . lisinopril (ZESTRIL) 10 MG tablet Take 1 tablet (10 mg total) by mouth daily. 30 tablet 0  . metFORMIN (GLUCOPHAGE) 500 MG tablet Take 1 tablet (500 mg total) by mouth 2 (two) times daily. (Patient taking differently: Take 500 mg by mouth 3 (three) times daily. ) 60 tablet 0  . ondansetron (ZOFRAN ODT) 4 MG disintegrating tablet Take 1 tablet (4 mg total) by mouth every 8 (eight) hours as needed for nausea or vomiting. 20 tablet 0     Allergies  Allergen Reactions  . Contrast Media [Iodinated Diagnostic Agents] Hives and Swelling    ROS:   All other systems reviewed & are negative except per HPI or as noted below: Constitutional:  +fevers, chills.  No sweats.  Weight stable Eyes:  No vision changes, No discharge HENT:  No sore throats,  nasal drainage Lymph: No neck swelling, No bruising easily Pulmonary:  No cough, productive sputum CV:  No orthopnea, PND  No exertional chest/neck/shoulder/arm pain. GI:  No personal nor family history of GI/colon cancer, inflammatory bowel disease, irritable bowel syndrome, allergy such as Celiac Sprue, dietary/dairy problems, colitis, ulcers nor gastritis.  No recent sick contacts/gastroenteritis.  No travel outside the country.  No changes in diet. Renal: No UTIs, No hematuria Genital:  No drainage, bleeding, masses Musculoskeletal: No severe joint pain.  Good ROM major joints Skin:  No sores or lesions.  No rashes Heme/Lymph:  No easy bleeding.  No swollen lymph nodes Neuro: No focal weakness/numbness.  No seizures Psych: No suicidal ideation.  No hallucinations  BP (!) 161/100   Pulse 99   Temp 99.6 F (37.6 C) (Oral)   Resp (!) 22   Ht '5\' 7"'$  (1.702 m)   Wt 128.8 kg   LMP 04/02/2020   SpO2 100%   BMI 44.48 kg/m   Physical Exam: Constitutional: Not cachectic.  Hygeine adequate.  Vitals signs as above.  Tired and sickly.  Does follow commands and answers basic questions. Eyes: Pupils reactive, normal extraocular movements. Sclera nonicteric Neuro: CN II-XII intact.  No major focal sensory defects.  No major motor deficits. Lymph: No head/neck/groin lymphadenopathy Psych:  No severe agitation.  No severe anxiety.  Judgment & insight Adequate, Oriented x4, HENT: Normocephalic, Mucus membranes moist.  No thrush.   Neck: Supple, No tracheal deviation.  No obvious thyromegaly  Chest: No pain to chest wall compression.  Good respiratory excursion.  No audible wheezing.  Chronic renal mentation opening sinuses it drained some purulence on the medial the colon be easily resected over that was at the rectal contrast or plan there is no balloon down there I he praziquantel chronic indwelling Foley well explained had really bad inframammary fold overlying the sternum.  Consistent with  chronic wounds?  Trigonitis  CV:  Pulses intact.  Regular rhythm.  No major extremity edema  Abdomen:  Soft.  Mildly distended.  Moderate sized panniculus with redness tenderness and crepitus on the right side.  15 x 10 cm region.  Correlates with CAT scan.  4 x 3 cm area of granulation tissue. No incarcerated hernias.  No hepatomegaly.  No splenomegaly  Gen:  No inguinal hernias.  No inguinal lymphadenopathy.   Ext: No obvious deformity or contracture no significant edema.  No cyanosis Skin: No major subcutaneous nodules.  Warm and dry Musculoskeletal: Severe joint rigidity not present.  No obvious clubbing.  No digital petechiae.     Results:   Labs: Results for orders placed or performed during the hospital encounter of 05/02/20 (from the past 48 hour(s))  SARS Coronavirus 2 by RT PCR (hospital order, performed in [facility] hospital lab) Nasopharyngeal Nasopharyngeal Swab     Status: Abnormal   Collection Time: 05/02/20  2:58 AM   Specimen: Nasopharyngeal Swab  Result Value Ref Range   SARS Coronavirus 2 POSITIVE (A) NEGATIVE    Comment: RESULT CALLED TO, READ BACK BY AND VERIFIED WITH: MAYNARD,C AT 0407 ON [id number] BY CHERESNOWSKY,T (NOTE) SARS-CoV-2 target nucleic acids are DETECTED  SARS-CoV-2 RNA is generally detectable in upper respiratory specimens  during the acute phase of infection.  Positive results are indicative  of the presence of the identified virus, but do not rule out bacterial infection or co-infection with other pathogens not detected by the test.  Clinical correlation with patient history and  other diagnostic information is necessary to determine patient infection status.  The expected result is negative.  Fact Sheet for Patients:  BoilerBrush.com.cy   Fact Sheet for Healthcare Providers:   https://pope.com/    This test is not yet approved or cleared by the [place] FDA and  has been authorized for  detection and/or diagnosis of SARS-CoV-2 by FDA under an Emergency Use Authorization (EUA).  This EUA will remain in effect (mean ing this test can be used) for the duration of  the COVID-19 declaration under Section 564(b)(1) of the Act, 21 U.S.C. section 360-bbb-3(b)(1), unless the authorization is terminated or revoked sooner.  Performed at [facility], [address] [place], [place] [address]   Comprehensive metabolic panel     Status: Abnormal   Collection Time: 05/02/20  3:06 AM  Result Value Ref Range   Sodium 129 (L) 135 - 145 mmol/L   Potassium 3.6 3.5 - 5.1 mmol/L   Chloride 100 98 - 111 mmol/L   CO2 18 (L) 22 - 32 mmol/L   Glucose, Bld 342 (H) 70 - 99 mg/dL    Comment: Glucose reference range applies only to samples taken after fasting for at least 8 hours.   BUN 5 (L) 6 - 20 mg/dL   Creatinine, Ser 6.96 0.44 - 1.00 mg/dL   Calcium 8.3 (L) 8.9 - 10.3 mg/dL   Total Protein 7.6 6.5 - 8.1 g/dL   Albumin 3.1 (L) 3.5 - 5.0 g/dL   AST 19 15 - 41 U/L   ALT 16 0 - 44 U/L   Alkaline Phosphatase 82 38 - 126 U/L   Total Bilirubin 0.6 0.3 - 1.2 mg/dL   GFR calc non Af Amer >60 >60 mL/min   GFR calc Af Amer >60 >60 mL/min   Anion gap 11 5 - 15    Comment: Performed at [facility], [address] [place], [place] [address]  Lactic acid, plasma     Status: None   Collection Time: 05/02/20  3:06 AM  Result Value Ref Range   Lactic Acid, Venous 1.9 0.5 - 1.9 mmol/L    Comment: Performed at [facility], [address] [place], [place] [address]  CBC with Differential     Status: Abnormal   Collection Time: 05/02/20  3:06 AM  Result Value Ref Range   WBC 30.7 (H) 4.0 - 10.5 K/uL   RBC 4.51 3.87 - 5.11 MIL/uL   Hemoglobin 11.0 (L) 12.0 - 15.0 g/dL   HCT 10.2 (L) 36 - 46 %   MCV 77.8 (L) 80.0 - 100.0 fL   MCH 24.4 (L) 26.0 - 34.0 pg   MCHC 31.3 30.0 - 36.0 g/dL   RDW 58.5 27.7 - 82.4 %   Platelets 345 150 - 400 K/uL   nRBC 0.0 0.0 - 0.2 %    Neutrophils Relative % 83 %   Neutro Abs 25.5 (H) 1.7 - 7.7 K/uL   Lymphocytes Relative 6 %   Lymphs Abs 1.9 0.7 - 4.0 K/uL   Monocytes Relative 9 %   Monocytes Absolute 2.6 (H) 0 - 1 K/uL   Eosinophils Relative 0 %   Eosinophils Absolute 0.0 0 - 0 K/uL   Basophils Relative 0 %   Basophils Absolute 0.1 0 - 0 K/uL   WBC Morphology TOXIC GRANULATION    RBC Morphology MORPHOLOGY UNREMARKABLE    Smear Review Normal platelet morphology    Immature Granulocytes 2 %   Abs Immature Granulocytes 0.58 (H) 0.00 - 0.07 K/uL    Comment: Performed at [facility],  [place], [place] [address]  Pregnancy, urine     Status: None   Collection Time: 05/02/20  4:22 AM  Result Value Ref Range   Preg Test, Ur NEGATIVE NEGATIVE    Comment:        THE SENSITIVITY OF THIS METHODOLOGY IS >20 mIU/mL. Performed at [facility], [address] [place], [place] [address]   Urinalysis, Routine w reflex microscopic     Status: Abnormal   Collection Time: 05/02/20  4:22 AM  Result Value Ref Range   Color, Urine ORANGE (A) YELLOW    Comment: BIOCHEMICALS MAY BE AFFECTED BY COLOR   APPearance CLOUDY (A) CLEAR   Specific Gravity, Urine 1.010 1.005 - 1.030   pH 6.5 5.0 - 8.0   Glucose, UA >=500 (A) NEGATIVE mg/dL   Hgb urine dipstick TRACE (A) NEGATIVE   Bilirubin Urine NEGATIVE NEGATIVE   Ketones, ur 15 (A) NEGATIVE mg/dL   Protein, ur 100 (A) NEGATIVE mg/dL   Nitrite NEGATIVE NEGATIVE   Leukocytes,Ua TRACE (A) NEGATIVE    Comment: Performed at [facility], [address] [place], [place] [address]  Urinalysis, Microscopic (reflex)     Status: Abnormal   Collection Time: 05/02/20  4:22 AM  Result Value Ref Range   RBC / HPF 0-5 0 - 5 RBC/hpf   WBC, UA 6-10 0 - 5 WBC/hpf   Bacteria, UA MANY (A) NONE SEEN   Squamous Epithelial / LPF 11-20 0 - 5    Comment: Performed at [facility], [address] [place], [place] [address]  Sedimentation  rate     Status: Abnormal   Collection Time: 05/02/20  5:31 AM  Result Value Ref Range   Sed Rate 84 (H) 0 - 22 mm/hr    Comment: Performed at [facility], [address] [place], [place] [address]  Hemoglobin A1c     Status: Abnormal   Collection Time: 05/02/20  5:43 AM  Result Value Ref Range   Hgb A1c MFr Bld 10.5 (H) 4.8 - 5.6 %    Comment: (NOTE) Pre diabetes:          5.7%-6.4%  Diabetes:              >6.4%  Glycemic control for   <7.0% adults with diabetes    Mean Plasma Glucose 254.65 mg/dL    Comment: Performed at [facility] [address], [place], [place] [address]  CBG monitoring, ED     Status: Abnormal   Collection Time: 05/02/20  6:10 AM  Result Value Ref Range   Glucose-Capillary 301 (H) 70 - 99 mg/dL    Comment: Glucose reference range applies only to samples taken after fasting for at least 8 hours.  CBG monitoring, ED     Status: Abnormal   Collection Time: 05/02/20  9:38 AM  Result Value Ref Range   Glucose-Capillary 238 (H) 70 - 99 mg/dL    Comment: Glucose reference range applies only to samples taken after fasting for at least 8 hours.    Imaging / Studies: CT ABDOMEN PELVIS WO CONTRAST  Result Date: 05/02/2020 CLINICAL DATA:  Right lower abdominal pain with fevers EXAM: CT ABDOMEN AND PELVIS WITHOUT CONTRAST TECHNIQUE: Multidetector CT imaging of the abdomen and pelvis was performed following the standard protocol without IV contrast. COMPARISON:  10/17/2019 FINDINGS: Lower chest: Lung bases are clear. Minimal pericardial thickening is noted although  improved when compared with the prior exam. Heart is mildly enlarged. Hepatobiliary: No focal liver abnormality is seen. Status post cholecystectomy. No biliary dilatation. Pancreas: Unremarkable. No pancreatic ductal dilatation or surrounding inflammatory changes. Spleen: Normal in size without focal abnormality. Adrenals/Urinary Tract: Adrenal glands are within normal limits bilaterally.  Kidneys are well visualized bilaterally without renal calculi or obstructive changes. Mild perinephric stranding is noted which may be related to underlying UTI. The bladder is decompressed. Stomach/Bowel: No obstructive or inflammatory changes of the colon are seen. The appendix is within normal limits. No inflammatory changes are noted. Small bowel and stomach are unremarkable. Vascular/Lymphatic: No significant vascular findings are present. No enlarged abdominal or pelvic lymph nodes. Reproductive: Uterus is well visualized and within normal limits. The previously seen left ovarian abnormality has reduced in size consistent with prior tubo-ovarian abscess with treatment. No definitive ovarian abnormality is seen. Other: Small amount of free fluid is noted in the pelvic cul-de-sac which may be physiologic in nature Musculoskeletal: No acute bony abnormality is noted. In the anterior abdominal wall inferiorly and eccentric to the right, there is significant subcutaneous air and inflammatory change consistent with a focal cellulitis. Minimal fluid is noted. IMPRESSION: Changes most consistent with cellulitis/panniculitis in the right lower abdominal wall. Minimal fluid is noted although a considerable amount of subcutaneous air and emphysema is seen. No drainable collection is seen. Resolution of previously seen left tubo-ovarian abscess. Mild perinephric stranding which may be related to underlying UTI. Clinical correlation is recommended. Electronically Signed   By: Inez Catalina M.D.   On: 05/02/2020 05:07   DG Chest Portable 1 View  Result Date: 05/02/2020 CLINICAL DATA:  Cough and fevers EXAM: PORTABLE CHEST 1 VIEW COMPARISON:  03/25/2020 FINDINGS: Cardiac shadow remains enlarged. The lungs are well aerated bilaterally. No focal infiltrate or effusion is seen. No bony abnormality is noted. IMPRESSION: No acute abnormality noted. Electronically Signed   By: Inez Catalina M.D.   On: 05/02/2020 05:08     Medications / Allergies: per chart  Antibiotics: Anti-infectives (From admission, onward)   Start     Dose/Rate Route Frequency Ordered Stop   05/02/20 2000  vancomycin (VANCOREADY) IVPB 1250 mg/250 mL     Discontinue     1,250 mg 166.7 mL/hr over 90 Minutes Intravenous Every 12 hours 05/02/20 0541     05/02/20 1000  clindamycin (CLEOCIN) IVPB 900 mg     Discontinue     900 mg 100 mL/hr over 30 Minutes Intravenous On call to O.R. 05/02/20 0539 05/03/20 0559   05/02/20 0544  vancomycin (VANCOCIN) 1000 MG powder       Note to Pharmacy: Isabel Caprice   : cabinet override      05/02/20 0544 05/02/20 0611   05/02/20 0530  piperacillin-tazobactam (ZOSYN) IVPB 3.375 g        3.375 g 100 mL/hr over 30 Minutes Intravenous  Once 05/02/20 0519 05/02/20 0633   05/02/20 0530  clindamycin (CLEOCIN) IVPB 600 mg        600 mg 100 mL/hr over 30 Minutes Intravenous  Once 05/02/20 0519 05/02/20 0730   05/02/20 0530  vancomycin (VANCOREADY) IVPB 2000 mg/400 mL     Discontinue     2,000 mg 200 mL/hr over 120 Minutes Intravenous  Once 05/02/20 [id number]          Note: Portions of this report may have been transcribed using voice recognition software. Every effort was made to ensure accuracy; however, inadvertent computerized transcription errors may  be present.   Any transcriptional errors that result from this process are unintentional.    Adin Hector, MD, FACS, MASCRS Gastrointestinal and Minimally Invasive Surgery  [facility] [address], [place],  [address] [phone] Fax [phone] Main/Paging  CONTACT INFORMATION: Weekday (9AM-5PM) concerns: Call [facility] main office at [phone] Weeknight (5PM-9AM) or Weekend/Holiday concerns: Check [email or web address] for General Surgery [facility] coverage (Please, do not use SecureChat as it is not reliable communication to operating surgeons for immediate patient care)      05/02/2020  9:52 AM

## 2020-05-02 NOTE — Anesthesia Procedure Notes (Signed)
Procedure Name: Intubation Date/Time: 05/02/2020 11:49 AM Performed by: Anne Fu, CRNA Pre-anesthesia Checklist: Patient identified, Emergency Drugs available, Suction available, Patient being monitored and Timeout performed Patient Re-evaluated:Patient Re-evaluated prior to induction Oxygen Delivery Method: Circle system utilized Preoxygenation: Pre-oxygenation with 100% oxygen Induction Type: IV induction, Rapid sequence and Cricoid Pressure applied Laryngoscope Size: Mac and 4 Grade View: Grade I Tube type: Oral Tube size: 7.5 mm Number of attempts: 1 Airway Equipment and Method: Stylet and Video-laryngoscopy Placement Confirmation: ETT inserted through vocal cords under direct vision,  positive ETCO2 and breath sounds checked- equal and bilateral Secured at: 21 cm Tube secured with: Tape Dental Injury: Teeth and Oropharynx as per pre-operative assessment  Comments: DL X 1 with noted bile green color fluid present above vocal cords on epiglottis and vocal folds.  Pt stated "feeling of nausea and vomiting several times today.", on assessment prior to induction.

## 2020-05-02 NOTE — Progress Notes (Signed)
Pharmacy Antibiotic Note  Carolyn Zimmerman is a 34 y.o. female admitted on 05/02/2020 with addominal wal cellulitis .  Pharmacy has been consulted for Vancomycin dosing. WBC is markedly elevated. COVID+.   Plan: Vancomycin 2000 mg IV x 1, then 1250 mg IV q12h Trend WBC, temp, renal function  F/U infectious work-up Drug levels as indicated  Height: 5\' 7"  (170.2 cm) Weight: 128.8 kg (284 lb) IBW/kg (Calculated) : 61.6  Temp (24hrs), Avg:99.6 F (37.6 C), Min:99.6 F (37.6 C), Max:99.6 F (37.6 C)  Recent Labs  Lab 05/02/20 0306  WBC 30.7*  CREATININE 0.79  LATICACIDVEN 1.9    Estimated Creatinine Clearance: 142.4 mL/min (by C-G formula based on SCr of 0.79 mg/dL).    Allergies  Allergen Reactions  . Contrast Media [Iodinated Diagnostic Agents] Hives and Swelling    05/04/20, PharmD, BCPS Clinical Pharmacist Phone: [phone]

## 2020-05-02 NOTE — Op Note (Addendum)
05/02/2020  1:08 PM  PATIENT:  Carolyn Zimmerman  34 y.o. female  Patient Care Team: System, Pcp Not In as PCP - Erline Hau, MD as Consulting Physician (General Surgery) Constant, Peggy, MD as Consulting Physician (Obstetrics and Gynecology)  PRE-OPERATIVE DIAGNOSIS:  Panniculitis with necrotizing fascitis  POST-OPERATIVE DIAGNOSIS:   PANNICULITIS WITH ABDOMINAL WALL ABSCESS CHEST WALL ABSCESSES X 2 LEFT MONS PUBIS/GROIN ABSCESS HIDRADENITIS SUPPURATIVA  PROCEDURE:   PANNICULECTOMY WITH EXCISION OF ABDOMINAL WALL ABSCESSES & PANNICULITIS INCISION AND DEBRIDEMENT OF CHEST WALL ABSCESS X 2 INCISION & DEBRIDEMENT OF MONS PUBIS ABSCESS X1   SURGEON:  Ardeth Sportsman, MD  ASSISTANT: OR STAFF   ANESTHESIA:   local and general  EBL:  Total I/O In: 80 [I.V.:30; IV Piggyback:50] Out: -   Delay start of Pharmacological VTE agent (>24hrs) due to surgical blood loss or risk of bleeding:  no  DRAINS: none   SPECIMEN:  Panniculus containing giant anaerobic abscess and hidradenitis suppurativa  Right inframammary chest wall abscess  Left inframammary chest wall abscess  Left groin abscess at mons pubis.  DISPOSITION OF SPECIMEN:  PATHOLOGY  COUNTS:  YES  PLAN OF CARE: Admit to inpatient   PATIENT DISPOSITION:  PACU - guarded condition.  INDICATION: [place] woman with uncontrolled diabetes mellitus and chronic hidradenitis with worsening abdominal pain and hyperglycemia fevers and shock.  CAT scan shows large gas pocket in panniculus suspicious for abscess and possible necrotizing fasciitis.  Also has chronic draining sinuses in anterior chest from prior incision drainage and hydradenitis in the armpits and groins.  I recommended urgent operative incision and drainage and aggressive debridement.  Patient is Covid positive.  However she is not severely hypoxic this time.  Medical admission for glucose stabilization.  The anatomy and physiology of the region was discussed.  The pathophysiology of subcutaneous abscess formation with progression to fasciitis & sepsis was discussed.  Need for incision, drainage, debridement discussed.  I stressed good hygiene & need for repeated wound care.  Possible redebridement / reoperation was discussed as well. Possibility of recurrence was discussed.   Risks of bleeding, infection, abscess, leak, injury to other organs, need for repair of tissues / organs, need for further treatment, heart attack, death, and other risks were discussed.  Benefits, alternatives were discussed. I noted a good likelihood this will help address the problem.  Questions answered.  The patient agrees to proceed.    OR FINDINGS:   Cellulitis and phlegmon with large anaerobic abscess involving most of the panniculus especially right-sided.  Panniculus excised with large open 30 x 15 x 7 cm wound.  Foul gray smelling tissue abscess sent for aerobic and anaerobic culture.  Abdominal fascia intact with no evidence of fasciitis  Bilateral groin hidradenitis.  Right side well controlled.  Left side with subcutaneous abscess and chronic drainage.  Incision and drainage done.  5 x 3 x 4 cm resulting wound.  Anterior chest wall hidradenitis with chronic draining abscesses in medial infraumbilical folds right and left.  Resulting 4 x 3 x 4 cm wounds  Chronic scarring in bilateral axillae consistent with chronic hydradenitis but no active infections.  CASE DATA:  Type of patient?: LDOW CASE (Surgical Hospitalist [facility] Inpatient)  Status of Case? EMERGENT Add On  Infection Present At Time Of Surgery (PATOS)?  ABSCESSES  DESCRIPTION:   Informed consent was confirmed. The patient received IV antibiotics. COVID precautions and protocol done.  The patient underwent general anesthesia without any difficulty. The patient was positioned  supine. SCDs were active during the entire case.  Anterior chest wall abdomen and groins were prepped and draped in a sterile fashion.  A surgical timeout confirmed our plan.   It being aggressive transverse biconcave incision through the right part of the panniculus and again tattered agree foul anaerobic large abscess cavity and most involving most of the panniculus especially on the right side.  I excised to healthier skin tissue with a resulting 30 x 15 cm wound.  I excised down to anterior abdominal wall fascia which was intact.  I assured hemostasis and packed the wound.  Next I turned attention to the chronic draining abscess sinuses underneath both breasts.  Did transverse laparotomy incision through the most fluctuant areas and excised chronic hidradenitis and abscess cavities.  Probe down to the chest wall but no evidence of fasciitis.  Did not seem to involve the mammary tissues.  Packed.  Inspected the groins.  Draining abscess in the left mons pubis near the inner thigh crease.  Tried to unroof with just punch biopsies but large fluctuant area encountered.  Therefore excised obliquely with a resulting 5 x 3 x 4 cm wound.  I do not see any major abscesses in the axilla.  There is no evidence of any abscesses by CT scan or examination in the perineal or sacral region so that was left alone.  I did reinspection.  I excised some excess skin on the panniculus to have healthier skin flaps until had soft infected subcutaneous tissues.  Again prove viability of the fascia.  Wound packed with chlorhexidine soaked large Kerlix gauzes.  Inframammary and left groin wound packed with 4 x 4 gauze.  Sterile dressings applied plan for patient to be extubated and watched in the stepdown unit given evidence of septic shock and severe hypoglycemia.    We plan to continue IV antibiotics and begin wound care training tomorrow.   Follow-up on cultures to adjust as needed.  I made an attempt to locate family to discuss patient's status and recommendations.  No answer from husband's phone.  No one is available at this time.  We will try again  later  Ardeth Sportsman, M.D., F.A.C.S. Gastrointestinal and Minimally Invasive Surgery [facility] [address] [place], [place] [address] [phone] Main / Paging

## 2020-05-02 NOTE — ED Notes (Signed)
Per Dr. Jacqulyn Bath, no need to recollect lactic acid blood draw. Repeat canceled

## 2020-05-02 NOTE — ED Notes (Signed)
Lab called covid positive result. MD aware

## 2020-05-02 NOTE — ED Notes (Signed)
Vancomycin reaction. Rate changed to 26ml/hr per EDP. 25mg  benadryl given

## 2020-05-02 NOTE — H&P (Addendum)
History and Physical  Karrigan Messamore HUT:[id number] DOB: Feb 21, 1986 DOA: 05/02/2020  PCP: System, Pcp Not In Patient coming from: Home  I have personally briefly reviewed patient's old medical records in [place]   Chief Complaint: abdominal pain  HPI: Carolyn Zimmerman is a 34 y.o. female past medical history of morbid obesity diabetes mellitus 9.4 and hypertension who comes into the ED for fever nausea diarrhea body aches and abdominal pain that started the day prior to admission she relates her pain in the abdomen is more in the right lower quadrant with a palpable mass tender to touch.  She denies any cuts or sores no vaginal bleeding or discharge.  She also relates that this morning she started having falls mulling from her private areas [place].  In the ED: She was found to be mildly tachycardic blood pressure stable saturations greater than 95%, white blood cell count of 30,000 with a left shift, lactic acid 1.9 hyponatremic with a blood glucose of 300, UA appears to be infected blood cultures were drawn she was started empirically on IV bank cefepime and clindamycin Further description as below. Review of Systems: All systems reviewed and apart from history of presenting illness, are negative.  Past Medical History:  Diagnosis Date   Abscess of chest wall 06/15/2017   Diabetes mellitus    Hypertension    Jaundice    Obesity    TOA (tubo-ovarian abscess) 09/2019   Past Surgical History:  Procedure Laterality Date   CHOLECYSTECTOMY     liver stent     Social History:  reports that she has been smoking cigarettes. She has never used smokeless tobacco. She reports previous alcohol use. She reports that she does not use drugs.   Allergies  Allergen Reactions   Contrast Media [Iodinated Diagnostic Agents] Hives and Swelling    Family History  Problem Relation Age of Onset   Hypertension Mother    Diabetes Father    Hypertension Father      Prior to  Admission medications   Medication Sig Start Date End Date Taking? Authorizing Provider  amLODipine (NORVASC) 5 MG tablet Take 1 tablet (5 mg total) by mouth daily. 03/25/20 05/02/20 Yes Gareth Morgan, MD  metFORMIN (GLUCOPHAGE-XR) 500 MG 24 hr tablet Take 500 mg by mouth 3 (three) times daily. 04/26/20  Yes [provider]  naproxen sodium (ALEVE) 220 MG tablet Take 220 mg by mouth daily as needed (pain).   Yes [provider]  Phenylephrine-Pheniramine-DM [facility] COLD & COUGH PO) Take 30 mLs by mouth daily as needed (cold and cough).   Yes [provider]  albuterol (PROVENTIL HFA;VENTOLIN HFA) 108 (90 Base) MCG/ACT inhaler Inhale 2 puffs into the lungs every 4 (four) hours as needed for wheezing or shortness of breath. Patient not taking: Reported on 10/18/2019 10/09/18   Arlean Hopping C, PA-C  blood glucose meter kit and supplies Dispense based on patient and insurance preference. Use up to four times daily as directed. (FOR ICD-9 250.00, 250.01). 06/18/17   Dessa Phi, DO  hydrochlorothiazide (HYDRODIURIL) 25 MG tablet Take 1 tablet (25 mg total) by mouth daily. Patient not taking: Reported on 10/18/2019 06/19/17   Dessa Phi, DO  ibuprofen (ADVIL) 600 MG tablet Take 1 tablet (600 mg total) by mouth every 6 (six) hours as needed (mild pain). Patient not taking: Reported on 05/02/2020 10/20/19   Constant, Peggy, MD  insulin aspart protamine- aspart (NOVOLOG MIX 70/30) (70-30) 100 UNIT/ML injection Inject 0.25 mLs (  25 Units total) into the skin 2 (two) times daily with a meal. Patient not taking: Reported on 10/18/2019 06/18/17 07/18/17  Dessa Phi, DO  Insulin Syringe-Needle U-100 (INSULIN SYRINGE .3CC/31GX5/16") 31G X 5/16" 0.3 ML MISC Use with insulin, twice daily 06/18/17   Dessa Phi, DO  lisinopril (ZESTRIL) 10 MG tablet Take 1 tablet (10 mg total) by mouth daily. 03/25/20   Gareth Morgan, MD  metFORMIN (GLUCOPHAGE) 500 MG tablet Take 1 tablet (500 mg total) by  mouth 2 (two) times daily. Patient taking differently: Take 500 mg by mouth 3 (three) times daily.  03/25/20 04/24/20  Gareth Morgan, MD  ondansetron (ZOFRAN ODT) 4 MG disintegrating tablet Take 1 tablet (4 mg total) by mouth every 8 (eight) hours as needed for nausea or vomiting. 03/25/20   Gareth Morgan, MD   Physical Exam: Vitals:   05/02/20 0235 05/02/20 0500 05/02/20 0607 05/02/20 0736  BP: (!) 154/96  (!) 142/84 (!) 161/100  Pulse: (!) 117 95 94 99  Resp: (!) 26  (!) 24 (!) 22  Temp: 99.6 F (37.6 C)     TempSrc: Oral     SpO2: 100% 100% 98% 100%  Weight: 128.8 kg     Height: 5' 7" (1.702 m)        General exam: Morbidly obese female when I went to the room there was a foul necrotic smell  Head, eyes and ENT: Nontraumatic and normocephalic. Pupils equally reacting to light and accommodation. Oral mucosa moist.  Neck: Supple. No JVD, carotid bruit or thyromegaly.  Lymphatics: Inguinal lymphadenopathy  Respiratory system: Clear to auscultation. No increased work of breathing.  Cardiovascular system: S1 and S2 heard, RRR. No JVD, murmurs, gallops, clicks or pedal edema.  Gastrointestinal system: Abdomen is nondistended, soft and nontender. Normal bowel sounds heard. No organomegaly or masses appreciated.  Central nervous system: Alert and oriented. No focal neurological deficits.  Extremities: Symmetric 5 x 5 power. Peripheral pulses symmetrically felt.   Skin: Her abdominal wall is erythematous tender to palpation with some mild drainage and some Candida infection, she has no visible drainage but she does have crepitus listers.  Musculoskeletal system: Negative exam.  Psychiatry: Pleasant and cooperative.   Labs on Admission:  Basic Metabolic Panel: Recent Labs  Lab 05/02/20 0306  NA 129*  K 3.6  CL 100  CO2 18*  GLUCOSE 342*  BUN 5*  CREATININE 0.79  CALCIUM 8.3*   Liver Function Tests: Recent Labs  Lab 05/02/20 0306  AST 19  ALT 16  ALKPHOS 82   BILITOT 0.6  PROT 7.6  ALBUMIN 3.1*   No results for input(s): LIPASE, AMYLASE in the last 168 hours. No results for input(s): AMMONIA in the last 168 hours. CBC: Recent Labs  Lab 05/02/20 0306  WBC 30.7*  NEUTROABS 25.5*  HGB 11.0*  HCT 35.1*  MCV 77.8*  PLT 345   Cardiac Enzymes: No results for input(s): CKTOTAL, CKMB, CKMBINDEX, TROPONINI in the last 168 hours.  BNP (last 3 results) No results for input(s): PROBNP in the last 8760 hours. CBG: Recent Labs  Lab 05/02/20 0610  GLUCAP 301*    Radiological Exams on Admission: CT ABDOMEN PELVIS WO CONTRAST  Result Date: 05/02/2020 CLINICAL DATA:  Right lower abdominal pain with fevers EXAM: CT ABDOMEN AND PELVIS WITHOUT CONTRAST TECHNIQUE: Multidetector CT imaging of the abdomen and pelvis was performed following the standard protocol without IV contrast. COMPARISON:  10/17/2019 FINDINGS: Lower chest: Lung bases are clear. Minimal pericardial thickening is noted  although improved when compared with the prior exam. Heart is mildly enlarged. Hepatobiliary: No focal liver abnormality is seen. Status post cholecystectomy. No biliary dilatation. Pancreas: Unremarkable. No pancreatic ductal dilatation or surrounding inflammatory changes. Spleen: Normal in size without focal abnormality. Adrenals/Urinary Tract: Adrenal glands are within normal limits bilaterally. Kidneys are well visualized bilaterally without renal calculi or obstructive changes. Mild perinephric stranding is noted which may be related to underlying UTI. The bladder is decompressed. Stomach/Bowel: No obstructive or inflammatory changes of the colon are seen. The appendix is within normal limits. No inflammatory changes are noted. Small bowel and stomach are unremarkable. Vascular/Lymphatic: No significant vascular findings are present. No enlarged abdominal or pelvic lymph nodes. Reproductive: Uterus is well visualized and within normal limits. The previously seen left  ovarian abnormality has reduced in size consistent with prior tubo-ovarian abscess with treatment. No definitive ovarian abnormality is seen. Other: Small amount of free fluid is noted in the pelvic cul-de-sac which may be physiologic in nature Musculoskeletal: No acute bony abnormality is noted. In the anterior abdominal wall inferiorly and eccentric to the right, there is significant subcutaneous air and inflammatory change consistent with a focal cellulitis. Minimal fluid is noted. IMPRESSION: Changes most consistent with cellulitis/panniculitis in the right lower abdominal wall. Minimal fluid is noted although a considerable amount of subcutaneous air and emphysema is seen. No drainable collection is seen. Resolution of previously seen left tubo-ovarian abscess. Mild perinephric stranding which may be related to underlying UTI. Clinical correlation is recommended. Electronically Signed   By: Inez Catalina M.D.   On: 05/02/2020 05:07   DG Chest Portable 1 View  Result Date: 05/02/2020 CLINICAL DATA:  Cough and fevers EXAM: PORTABLE CHEST 1 VIEW COMPARISON:  03/25/2020 FINDINGS: Cardiac shadow remains enlarged. The lungs are well aerated bilaterally. No focal infiltrate or effusion is seen. No bony abnormality is noted. IMPRESSION: No acute abnormality noted. Electronically Signed   By: Inez Catalina M.D.   On: 05/02/2020 05:08    EKG: Independently reviewed.  Sinus rhythm normal axis LVH T wave inversions in V1 and 2, J-point elevation in 4 5  Assessment/Plan Sepsis due to necrotizing fasciitis with Abdominal wall abscess; I will go ahead and fluid resuscitate her with lactated Ringer's, will start empirically on IV Vanco meropenem and clindamycin. Surgery has been is already been consulted, will place her n.p.o. she is going to the surgical suite this morning. Cultures have already been obtained by the ED, will need to have tight blood glucose control as her blood glucose is running consistently above  300. She will need to go to a stepdown unit. Recheck basic metabolic panel CBC and lactate in the morning.  Hyperglycemia with uncontrolled diabetes mellitus type 2: We will place her n.p.o. will start on long-acting insulin plus sliding scale. At this time we will hydrate her aggressively and will give her NovoLog for better blood glucose control during surgery as her blood sugars continues to be uncontrolled. She was given 11 units of NovoLog, will give her 20 units of Levemir now.  Her blood glucose control will rise during surgery.  Incidental COVID-19 viral infection: She denies any cough shortness of breath, x-ray shows no infiltrates, CT scan cut the bases of the lung does not show any infiltrates. Sure at this time due to her severe infection her inflammatory markers will be significantly elevated due to her necrotizing fasciitis.  At this point in time I will not start IV remdesivir steroids. We will continue to  monitor.  Microcytic anemia: A menstruating female hemoglobin is 11will drop after fluid resuscitation recheck and transfuse as needed for symptomatic.  Hx of medication noncompliance Counseling.  Obesity, Class III, BMI 40-49.9 (morbid obesity) (HCC) Counseling  Candidal intertrigo: We will start her on IV Diflucan.   DVT Prophylaxis: lovenox Code Status: full  Family Communication: None Disposition Plan: Inpatient      It is my clinical opinion that admission to INPATIENT is reasonable and necessary in this 34 y.o. female past medical history of hypertension and uncontrolled diabetes mellitus comes in for necrotizing fasciitis requiring surgery and IV antibiotics.  Given the aforementioned, the predictability of an adverse outcome is felt to be significant. I expect that the patient will require at least 2 midnights in the hospital to treat this condition.  Charlynne Cousins MD [facility]   05/02/2020, 9:26 AM

## 2020-05-02 NOTE — ED Notes (Signed)
Notified Carolyn Zimmerman that the pt has arrived from [facility]. She states that she will notify Dr. Michaell Cowing that the pt is here

## 2020-05-02 NOTE — Anesthesia Postprocedure Evaluation (Signed)
Anesthesia Post Note  Patient: Carolyn Zimmerman  Procedure(s) Performed: IRRIGATION AND DEBRIDEMENT ABDOMINAL and CHEST WALL NECROTIZING FASCITITS (N/A Abdomen)     Patient location during evaluation: PACU Anesthesia Type: General Level of consciousness: awake and alert, awake and oriented Pain management: pain level controlled Vital Signs Assessment: post-procedure vital signs reviewed and stable Respiratory status: spontaneous breathing, nonlabored ventilation, respiratory function stable and patient connected to nasal cannula oxygen Cardiovascular status: blood pressure returned to baseline and stable Postop Assessment: no apparent nausea or vomiting Anesthetic complications: no   No complications documented.  Last Vitals:  Vitals:   05/02/20 1330 05/02/20 1345  BP: (!) 153/83 (!) 148/80  Pulse: 87 88  Resp: (!) 27 (!) 29  Temp:    SpO2: 98% 96%    Last Pain:  Vitals:   05/02/20 1345  TempSrc:   PainSc: 0-No pain                 Cecile Hearing

## 2020-05-02 NOTE — Progress Notes (Signed)
Pharmacy Antibiotic Note  Carolyn Zimmerman is a 34 y.o. female with hx DM presented to the ED on 05/02/2020 with c/o right lower abdominal pain, fever, n/v/d. COVID test came back positive. Abd CT showed findings consistent with cellulitis/panniculitis in the right lower abdominal wall.  With concern for necrotizing fasciitis, plan is to take her to the OR for I&D on 8/15.  Patient is consulted to add meropenem to abx regimen for infection.  Today, 05/02/2020: - afeb, wbc 30.7 -  scr 0.79 (crcl~100)  Plan: - meropenem 1gm IV q8h - adjust vancomycin dose to 1250 mg IV q8h - f/u renal function, culture, and clinical status ________________________________________  Height: 5\' 7"  (170.2 cm) Weight: 128.8 kg (284 lb) IBW/kg (Calculated) : 61.6  Temp (24hrs), Avg:99.6 F (37.6 C), Min:99.6 F (37.6 C), Max:99.6 F (37.6 C)  Recent Labs  Lab 05/02/20 0306  WBC 30.7*  CREATININE 0.79  LATICACIDVEN 1.9    Estimated Creatinine Clearance: 142.4 mL/min (by C-G formula based on SCr of 0.79 mg/dL).    Allergies  Allergen Reactions  . Contrast Media [Iodinated Diagnostic Agents] Hives and Swelling   Antimicrobials this admission:  8/15 zosyn x1 8/15 vanc>> 8/15 merrem>>  Microbiology results:  8/15 BCx x2:  8/15 COVID+   Thank you for allowing pharmacy to be a part of this patient's care.  9/15 05/02/2020 10:05 AM

## 2020-05-02 NOTE — Transfer of Care (Signed)
Immediate Anesthesia Transfer of Care Note  Patient: Carolyn Zimmerman  Procedure(s) Performed: Procedure(s): IRRIGATION AND DEBRIDEMENT ABDOMINAL and CHEST WALL NECROTIZING FASCITITS (N/A)  Patient Location: PACU  Anesthesia Type:General  Level of Consciousness:  sedated, patient cooperative and responds to stimulation  Airway & Oxygen Therapy:Patient Spontanous Breathing and Patient connected to face mask oxgen  Post-op Assessment:  Report given to PACU RN and Post -op Vital signs reviewed and stable  Post vital signs:  Reviewed and stable  Last Vitals:  Vitals:   05/02/20 0607 05/02/20 0736  BP: (!) 142/84 (!) 161/100  Pulse: 94 99  Resp: (!) 24 (!) 22  Temp:    SpO2: 98% 100%    Complications: No apparent anesthesia complications

## 2020-05-02 NOTE — ED Triage Notes (Addendum)
Pt c/o right lower abd pain that started yesterday. States she took aleve without relief. Describes as sharp and states comes and goes. C/o n/v/d c/o fevers. Pt did have a cough in triage. States kids also had a cough. Pt has not had the covid vaccine. Pt is also hyperventilating. Encouraged to slow breathing. Pt's mother states she will panic at times.

## 2020-05-02 NOTE — ED Provider Notes (Signed)
Patient arrives via transfer from med [facility].  Concern for significant abdominal wall infection.  Treatment has been initiated and consultations made for transfer. Physical Exam  BP (!) 161/100   Pulse 99   Temp 99.6 F (37.6 C) (Oral)   Resp (!) 22   Ht 5\' 7"  (1.702 m)   Wt 128.8 kg   LMP 04/02/2020   SpO2 100%   BMI 44.48 kg/m   Physical Exam  ED Course/Procedures     Procedures  MDM  Consult: 09 10 Dr. 10 of general surgery notified of patient arrival.  He advises Dr. Magnus Ivan will be seeing the patient and since he comes out of the OR. Consult: 09: 15 Dr. 10 for admission to hospitalist service.  Patient arrives via transfer from [facility] for concern of possible abdominal wall necrotizing fasciitis.  Patient has had antibiotics and fluids initiated.  We will continue to manage for pain control and observe while in the emergency department pending surgical care and medical admission.       [facility], MD 05/02/20 [phone]

## 2020-05-02 NOTE — ED Provider Notes (Signed)
Emergency Department Provider Note   I have reviewed the triage vital signs and the nursing notes.   HISTORY  Chief Complaint Abdominal Pain   HPI Carolyn Zimmerman is a 34 y.o. female with PMH of DM, HTN, and elevated BMI presents to the emergency department for evaluation of fever, nausea, vomiting, diarrhea along with body aches.  Patient developed symptoms yesterday.  She also describes some right lower quadrant abdominal pain with palpable mass in this area.  She tried to apply a pain cream to the area with no relief in symptoms.  She has not seen any redness or drainage to the area.  She denies any vaginal bleeding or discharge.  No dysuria, hesitancy, urgency.  Patient denies any known COVID-19 contacts.  She states that she has had some associated shortness of breath with cough.  She notes that her body aches are very uncomfortable and causing her more pain overall than the abdominal discomfort.  Past Medical History:  Diagnosis Date  . Abscess of chest wall 06/15/2017  . Diabetes mellitus   . Hypertension   . Jaundice   . Obesity   . TOA (tubo-ovarian abscess) 09/2019    Patient Active Problem List   Diagnosis Date Noted  . Abdominal wall abscess 05/02/2020  . Obesity, Class III, BMI 40-49.9 (morbid obesity) (HCC) 05/02/2020  . COVID-19 virus infection 05/02/2020  . Panniculitis abdominal wall 05/02/2020  . Type 2 diabetes mellitus with hyperglycemia (HCC) 10/18/2019  . TOA (tubo-ovarian abscess) 10/17/2019  . Elevated LFTs 09/16/2018  . Type 2 diabetes mellitus with unspecified complications (HCC) 09/16/2018  . Uncontrolled hypertension 09/16/2018  . Increased frequency of urination 09/16/2018  . Bile duct stricture 07/30/2018  . Hyperglycemia 06/15/2017  . Hx of medication noncompliance     Past Surgical History:  Procedure Laterality Date  . CHOLECYSTECTOMY    . liver stent      Allergies Contrast media [iodinated diagnostic agents]  No family history on  file.  Social History Social History   Tobacco Use  . Smoking status: Current Some Day Smoker    Types: Cigarettes  . Smokeless tobacco: Never Used  Vaping Use  . Vaping Use: Former  Substance Use Topics  . Alcohol use: Not Currently    Comment: occ  . Drug use: No    Review of Systems  Constitutional: Positive fever/chills Eyes: No visual changes. ENT: No sore throat. Cardiovascular: Denies chest pain. Respiratory: Positive shortness of breath and cough.  Gastrointestinal: Positive lower abdominal pain. Positive nausea, vomiting, and diarrhea.  No constipation. Genitourinary: Negative for dysuria. Musculoskeletal: Positive diffuse muscle aches.  Skin: Negative for rash. Neurological: Negative for focal weakness or numbness.  10-point ROS otherwise negative.  ____________________________________________   PHYSICAL EXAM:  VITAL SIGNS: ED Triage Vitals  Enc Vitals Group     BP 05/02/20 0235 (!) 154/96     Pulse Rate 05/02/20 0235 (!) 117     Resp 05/02/20 0235 (!) 26     Temp 05/02/20 0235 99.6 F (37.6 C)     Temp Source 05/02/20 0235 Oral     SpO2 05/02/20 0235 100 %     Weight 05/02/20 0235 284 lb (128.8 kg)     Height 05/02/20 0235 5\' 7"  (1.702 m)   Constitutional: Alert and oriented. Well appearing and in no acute distress. Eyes: Conjunctivae are normal.  Head: Atraumatic. Nose: No congestion/rhinnorhea. Mouth/Throat: Mucous membranes are moist.   Neck: No stridor.   Cardiovascular: Tachycardia. Good peripheral circulation. Grossly  normal heart sounds.   Respiratory: Normal respiratory effort.  No retractions. Lungs CTAB. Gastrointestinal: Soft with tenderness in the RLQ with focal area of cellulitis. Question skin abscess in the suprapubic area. No distention.  Musculoskeletal: No gross deformities of extremities. Neurologic:  Normal speech and language.  Skin:  Skin is warm and dry. Lower abdominal cellulitis with questionable abscess as above.     ____________________________________________   LABS (all labs ordered are listed, but only abnormal results are displayed)  Labs Reviewed  SARS CORONAVIRUS 2 BY RT PCR (HOSPITAL ORDER, PERFORMED IN [place] [facility]) - Abnormal; Notable for the following components:      Result Value   SARS Coronavirus 2 POSITIVE (*)    All other components within normal limits  COMPREHENSIVE METABOLIC PANEL - Abnormal; Notable for the following components:   Sodium 129 (*)    CO2 18 (*)    Glucose, Bld 342 (*)    BUN 5 (*)    Calcium 8.3 (*)    Albumin 3.1 (*)    All other components within normal limits  CBC WITH DIFFERENTIAL/PLATELET - Abnormal; Notable for the following components:   WBC 30.7 (*)    Hemoglobin 11.0 (*)    HCT 35.1 (*)    MCV 77.8 (*)    MCH 24.4 (*)    Neutro Abs 25.5 (*)    Monocytes Absolute 2.6 (*)    Abs Immature Granulocytes 0.58 (*)    All other components within normal limits  URINALYSIS, ROUTINE W REFLEX MICROSCOPIC - Abnormal; Notable for the following components:   Color, Urine ORANGE (*)    APPearance CLOUDY (*)    Glucose, UA >=500 (*)    Hgb urine dipstick TRACE (*)    Ketones, ur 15 (*)    Protein, ur 100 (*)    Leukocytes,Ua TRACE (*)    All other components within normal limits  URINALYSIS, MICROSCOPIC (REFLEX) - Abnormal; Notable for the following components:   Bacteria, UA MANY (*)    All other components within normal limits  CBG MONITORING, ED - Abnormal; Notable for the following components:   Glucose-Capillary 301 (*)    All other components within normal limits  CULTURE, BLOOD (ROUTINE X 2)  CULTURE, BLOOD (ROUTINE X 2)  MRSA PCR SCREENING  LACTIC ACID, PLASMA  PREGNANCY, URINE  SEDIMENTATION RATE  C-REACTIVE PROTEIN  HEMOGLOBIN A1C   ____________________________________________  EKG   EKG Interpretation  Date/Time:  Sunday May 02 2020 06:28:04 EDT Ventricular Rate:  89 PR Interval:    QRS Duration: 110 QT  Interval:  394 QTC Calculation: 480 R Axis:   60 Text Interpretation: Sinus rhythm Lateral T waves changes new from prior. No STEMI Confirmed by Alona BeneLong, Elexa Kivi [phone]) on 05/02/2020 6:31:44 AM       ____________________________________________  RADIOLOGY  CT ABDOMEN PELVIS WO CONTRAST  Result Date: 05/02/2020 CLINICAL DATA:  Right lower abdominal pain with fevers EXAM: CT ABDOMEN AND PELVIS WITHOUT CONTRAST TECHNIQUE: Multidetector CT imaging of the abdomen and pelvis was performed following the standard protocol without IV contrast. COMPARISON:  10/17/2019 FINDINGS: Lower chest: Lung bases are clear. Minimal pericardial thickening is noted although improved when compared with the prior exam. Heart is mildly enlarged. Hepatobiliary: No focal liver abnormality is seen. Status post cholecystectomy. No biliary dilatation. Pancreas: Unremarkable. No pancreatic ductal dilatation or surrounding inflammatory changes. Spleen: Normal in size without focal abnormality. Adrenals/Urinary Tract: Adrenal glands are within normal limits bilaterally. Kidneys are well visualized bilaterally without  renal calculi or obstructive changes. Mild perinephric stranding is noted which may be related to underlying UTI. The bladder is decompressed. Stomach/Bowel: No obstructive or inflammatory changes of the colon are seen. The appendix is within normal limits. No inflammatory changes are noted. Small bowel and stomach are unremarkable. Vascular/Lymphatic: No significant vascular findings are present. No enlarged abdominal or pelvic lymph nodes. Reproductive: Uterus is well visualized and within normal limits. The previously seen left ovarian abnormality has reduced in size consistent with prior tubo-ovarian abscess with treatment. No definitive ovarian abnormality is seen. Other: Small amount of free fluid is noted in the pelvic cul-de-sac which may be physiologic in nature Musculoskeletal: No acute bony abnormality is noted. In  the anterior abdominal wall inferiorly and eccentric to the right, there is significant subcutaneous air and inflammatory change consistent with a focal cellulitis. Minimal fluid is noted. IMPRESSION: Changes most consistent with cellulitis/panniculitis in the right lower abdominal wall. Minimal fluid is noted although a considerable amount of subcutaneous air and emphysema is seen. No drainable collection is seen. Resolution of previously seen left tubo-ovarian abscess. Mild perinephric stranding which may be related to underlying UTI. Clinical correlation is recommended. Electronically Signed   By: Alcide Clever M.D.   On: 05/02/2020 05:07   DG Chest Portable 1 View  Result Date: 05/02/2020 CLINICAL DATA:  Cough and fevers EXAM: PORTABLE CHEST 1 VIEW COMPARISON:  03/25/2020 FINDINGS: Cardiac shadow remains enlarged. The lungs are well aerated bilaterally. No focal infiltrate or effusion is seen. No bony abnormality is noted. IMPRESSION: No acute abnormality noted. Electronically Signed   By: Alcide Clever M.D.   On: 05/02/2020 05:08    ____________________________________________   PROCEDURES  Procedure(s) performed:   .Critical Care Performed by: Maia Plan, MD Authorized by: Maia Plan, MD   Critical care provider statement:    Critical care time (minutes):  75   Critical care time was exclusive of:  Separately billable procedures and treating other patients and teaching time   Critical care was necessary to treat or prevent imminent or life-threatening deterioration of the following conditions:  Sepsis   Critical care was time spent personally by me on the following activities:  Discussions with consultants, evaluation of patient's response to treatment, examination of patient, ordering and performing treatments and interventions, ordering and review of laboratory studies, ordering and review of radiographic studies, pulse oximetry, re-evaluation of patient's condition, obtaining  history from patient or surrogate, review of old charts, blood draw for specimens and development of treatment plan with patient or surrogate   I assumed direction of critical care for this patient from another provider in my specialty: no       ____________________________________________   INITIAL IMPRESSION / ASSESSMENT AND PLAN / ED COURSE  Pertinent labs & imaging results that were available during my care of the patient were reviewed by me and considered in my medical decision making (see chart for details).   Patient presents to the emergency department with flulike symptoms in addition to abdominal pain.  She has had some lower abdominal cellulitis with question of small cutaneous abscess in the suprapubic region.  Arrives with tachycardia but appears uncomfortable.  Lower suspicion clinically for sepsis.  Plan for labs, noncontrast CT, chest x-ray, and Covid swab.  Exceedingly low suspicion for underlying appendicitis, TOA, ovarian torsion although these were considered during my evaluation.   05:40 AM Spoke with Dr. Michaell Cowing on the line with [facility].  Expressed concern for potentially necrotizing infection.  Starting antibiotics and Dr. Michaell Cowing agrees the patient will need urgent surgical management.  There are no beds at either [facility] or [facility] to accept this patient after surgery.  Patient is Covid positive which complicates matters.  Will reach out to the hospitalist to see if there can be bed made to treat this person.   05:49 AM  Spoke with [facility]. They have no beds to accept the patient. Even with an emergent procedure the patient would have to be placed on a wait list for transfer.   06:08 AM  Spoke with [facility] charge nurse, OR nursing staff, EDP Dr. Preston Fleeting and [facility] Dr. Loney Loh.  Due to the unusual nature of this case the patient will come to the [facility] emergency department and be simultaneously evaluated by both general surgery and the hospitalist team.  The patient can  then go to the OR for management of the potentially necrotizing abdominal wall infection.  Patient can recover in the OR and then if a bed is not available the patient will have to go back to the emergency department for boarding.  This is far from ideal but given the nature of the patient's abdominal wound and time sensitive nature of this issue this is seems to be in the patient's best interest.  In speaking with Dr. Michaell Cowing the [facility] is also involved and will be actively trying to place the patient somewhere other than the ER after surgery.  ____________________________________________  FINAL CLINICAL IMPRESSION(S) / ED DIAGNOSES  Final diagnoses:  Acute panniculitis  COVID-19     MEDICATIONS GIVEN DURING THIS VISIT:  Medications  clindamycin (CLEOCIN) IVPB 600 mg (has no administration in time range)  vancomycin (VANCOREADY) IVPB 2000 mg/400 mL (2,000 mg Intravenous Not Given 05/02/20 0616)  vancomycin (VANCOREADY) IVPB 1250 mg/250 mL (has no administration in time range)  insulin aspart (novoLOG) injection 0-15 Units (11 Units Subcutaneous Given 05/02/20 0629)  0.9 %  sodium chloride infusion ( Intravenous New Bag/Given 05/02/20 0603)  0.9 %  sodium chloride infusion (1,000 mLs Intravenous New Bag/Given 05/02/20 0609)  morphine 4 MG/ML injection 4 mg (4 mg Intravenous Given 05/02/20 0313)  ondansetron (ZOFRAN) injection 4 mg (4 mg Intravenous Given 05/02/20 0313)  sodium chloride 0.9 % bolus 500 mL (0 mLs Intravenous Stopped 05/02/20 0443)  piperacillin-tazobactam (ZOSYN) IVPB 3.375 g (3.375 g Intravenous New Bag/Given 05/02/20 0603)  ondansetron (ZOFRAN) injection 4 mg (4 mg Intravenous Given 05/02/20 0603)  vancomycin (VANCOCIN) 1000 MG powder (2,000 mg  Given 05/02/20 7342)    Note:  This document was prepared using Dragon voice recognition software and may include unintentional dictation errors.  Alona Bene, MD, [facility] Emergency Medicine    Gladies Sofranko, Arlyss Repress, MD 05/02/20 [phone]

## 2020-05-02 NOTE — ED Notes (Signed)
No LR on floor in stock. Materials notified.

## 2020-05-02 NOTE — Care Plan (Signed)
Paged APP X. [place] re: two sliding scale coverage orders.

## 2020-05-03 ENCOUNTER — Encounter (HOSPITAL_COMMUNITY): Payer: Self-pay | Admitting: Surgery

## 2020-05-03 LAB — CBC
HCT: 34 % — ABNORMAL LOW (ref 36.0–46.0)
Hemoglobin: 10.1 g/dL — ABNORMAL LOW (ref 12.0–15.0)
MCH: 24.2 pg — ABNORMAL LOW (ref 26.0–34.0)
MCHC: 29.7 g/dL — ABNORMAL LOW (ref 30.0–36.0)
MCV: 81.3 fL (ref 80.0–100.0)
Platelets: 350 10*3/uL (ref 150–400)
RBC: 4.18 MIL/uL (ref 3.87–5.11)
RDW: 15.9 % — ABNORMAL HIGH (ref 11.5–15.5)
WBC: 32.5 10*3/uL — ABNORMAL HIGH (ref 4.0–10.5)
nRBC: 0 % (ref 0.0–0.2)

## 2020-05-03 LAB — PHOSPHORUS: Phosphorus: 2.8 mg/dL (ref 2.5–4.6)

## 2020-05-03 LAB — BASIC METABOLIC PANEL
Anion gap: 10 (ref 5–15)
BUN: 8 mg/dL (ref 6–20)
CO2: 24 mmol/L (ref 22–32)
Calcium: 8.1 mg/dL — ABNORMAL LOW (ref 8.9–10.3)
Chloride: 106 mmol/L (ref 98–111)
Creatinine, Ser: 0.7 mg/dL (ref 0.44–1.00)
GFR calc Af Amer: >60 mL/min (ref >60)
GFR calc non Af Amer: >60 mL/min (ref >60)
Glucose, Bld: 107 mg/dL — ABNORMAL HIGH (ref 70–99)
Potassium: 3.3 mmol/L — ABNORMAL LOW (ref 3.5–5.1)
Sodium: 140 mmol/L (ref 135–145)

## 2020-05-03 LAB — GLUCOSE, CAPILLARY
Glucose-Capillary: 231 mg/dL — ABNORMAL HIGH (ref 70–99)
Glucose-Capillary: 170 mg/dL — ABNORMAL HIGH (ref 70–99)
Glucose-Capillary: 99 mg/dL (ref 70–99)
Glucose-Capillary: 201 mg/dL — ABNORMAL HIGH (ref 70–99)
Glucose-Capillary: 171 mg/dL — ABNORMAL HIGH (ref 70–99)
Glucose-Capillary: 183 mg/dL — ABNORMAL HIGH (ref 70–99)
Glucose-Capillary: 189 mg/dL — ABNORMAL HIGH (ref 70–99)

## 2020-05-03 LAB — MAGNESIUM: Magnesium: 1.8 mg/dL (ref 1.7–2.4)

## 2020-05-03 LAB — CORTISOL-AM, BLOOD: Cortisol - AM: 14.4 ug/dL (ref 6.7–22.6)

## 2020-05-03 MED ORDER — METRONIDAZOLE IN NACL 5-0.79 MG/ML-% IV SOLN
500.0000 mg | Freq: Three times a day (TID) | INTRAVENOUS | Status: DC
  Administered 2020-05-03 – 2020-05-07 (×13): 500 mg via INTRAVENOUS
  Filled 2020-05-03 (×13): qty 100

## 2020-05-03 MED ORDER — LABETALOL HCL 5 MG/ML IV SOLN
INTRAVENOUS | Status: AC
  Administered 2020-05-03: 10 mg via INTRAVENOUS
  Filled 2020-05-03: qty 4

## 2020-05-03 MED ORDER — POTASSIUM CHLORIDE CRYS ER 20 MEQ PO TBCR
40.0000 meq | EXTENDED_RELEASE_TABLET | Freq: Every day | ORAL | Status: AC
  Administered 2020-05-03 – 2020-05-05 (×3): 40 meq via ORAL
  Filled 2020-05-03 (×3): qty 2

## 2020-05-03 MED ORDER — GLUCERNA SHAKE PO LIQD
237.0000 mL | Freq: Two times a day (BID) | ORAL | Status: DC
  Administered 2020-05-03 – 2020-05-04 (×4): 237 mL via ORAL
  Filled 2020-05-03 (×5): qty 237

## 2020-05-03 MED ORDER — LABETALOL HCL 5 MG/ML IV SOLN
10.0000 mg | Freq: Once | INTRAVENOUS | Status: AC
  Filled 2020-05-03: qty 4

## 2020-05-03 MED ORDER — SODIUM CHLORIDE 0.9 % IV SOLN
2.0000 g | Freq: Three times a day (TID) | INTRAVENOUS | Status: DC
  Administered 2020-05-03 – 2020-05-07 (×12): 2 g via INTRAVENOUS
  Filled 2020-05-03 (×15): qty 2

## 2020-05-03 MED ORDER — SODIUM CHLORIDE 0.9 % IV SOLN
1.0000 g | Freq: Three times a day (TID) | INTRAVENOUS | Status: DC
  Filled 2020-05-03: qty 1

## 2020-05-03 MED ORDER — GABAPENTIN 400 MG PO CAPS
400.0000 mg | ORAL_CAPSULE | Freq: Three times a day (TID) | ORAL | Status: DC
  Administered 2020-05-03 – 2020-05-12 (×28): 400 mg via ORAL
  Filled 2020-05-03 (×28): qty 1

## 2020-05-03 NOTE — Progress Notes (Signed)
Pharmacy Antibiotic Note  Carolyn Zimmerman is a 34 y.o. female admitted on 05/02/2020 with abdominal wall cellulitis, abscess and possible necrotizing fasciitis.  Pharmacy has been consulted for vancomycin, and cefepime dosing.  Plan: Metronidazole per MD Cefepime 2g IV q8h Vancomycin 1250 mg IV q8h. Follow up renal function, culture results, and clinical course.   Height: 5\' 7"  (170.2 cm) Weight: 128.5 kg (283 lb 4.7 oz) IBW/kg (Calculated) : 61.6  Temp (24hrs), Avg:98.4 F (36.9 C), Min:97.7 F (36.5 C), Max:99 F (37.2 C)  Recent Labs  Lab 05/02/20 0306 05/02/20 1711 05/03/20 0310  WBC 30.7* 41.5* 32.5*  CREATININE 0.79 0.63 0.70  LATICACIDVEN 1.9  --   --     Estimated Creatinine Clearance: 142.2 mL/min (by C-G formula based on SCr of 0.7 mg/dL).    Allergies  Allergen Reactions  . Contrast Media [Iodinated Diagnostic Agents] Hives and Swelling    Antimicrobials this admission:  8/15 Vanc >> 8/15 Meropenem >>8/16 8/15 Fluconazole >> 8/16 8/15 metronidazole >> 8/16 8/16 Cefepime  Dose adjustments this admission:    Microbiology results:  8/15 BCx x2: ngtd 8/15 COVID+ 8/15 Wound: mod GNR, few GPC, rare GPR >>  8/15 MRSA PCR:  Thank you for allowing pharmacy to be a part of this patient's care.  9/15 PharmD, BCPS Clinical Pharmacist [facility] main pharmacy [phone] 05/03/2020 9:58 AM

## 2020-05-03 NOTE — Progress Notes (Signed)
Carolyn Zimmerman [id number] 16-Apr-1986  CARE TEAM:  PCP: System, Pcp Not In  Outpatient Care Team: Patient Care Team: System, Pcp Not In as PCP - Erline Hau, MD as Consulting Physician (General Surgery) Catalina Antigua, MD as Consulting Physician (Obstetrics and Gynecology)  Inpatient Treatment Team: Treatment Team: Attending Provider: Azucena Fallen, MD; Consulting Physician: Montez Morita, Md, MD; Technician: Ivan Anchors, NT; Rounding Team: Jessie Foot, MD; Technician: Delynn Flavin; Registered Nurse: Marvia Pickles, RN; Technician: Kittie Plater, NT; Case Manager: Shon Baton, RN   Problem List:   Principal Problem:   Panniuclitis with abdominal wall abscess s/p panniculectomy 05/02/2020 Active Problems:   Hx of medication noncompliance   Necrotizing fasciitis (HCC)   Hidradenitis suppurativa of axillae,chest wall, groins   COVID-19 virus infection   Chest wall abscesses x 2 s/p I&D 05/02/2020   Hyperglycemia   Uncontrolled hypertension   Type 2 diabetes mellitus with hyperglycemia (HCC)   Obesity, Class III, BMI 40-49.9 (morbid obesity) (HCC)   Sepsis (HCC)   1 Day Post-Op  05/02/2020  POST-OPERATIVE DIAGNOSIS:   PANNICULITIS WITH ABDOMINAL WALL ABSCESS CHEST WALL ABSCESSES X 2 LEFT MONS PUBIS/GROIN ABSCESS HIDRADENITIS SUPPURATIVA  PROCEDURE:   PANNICULECTOMY WITH EXCISION OF ABDOMINAL WALL ABSCESSES & PANNICULITIS INCISION AND DEBRIDEMENT OF CHEST WALL ABSCESS X 2 INCISION & DEBRIDEMENT OF MONS PUBIS ABSCESS X1   SURGEON:  Ardeth Sportsman, MD    Assessment  Recovering status post wide excision of panniculitis with large abscess as well as incision drainage of chest wall and groin abscesses from chronic hidradenitis  [facility] Stay = 1 days)  Plan:  Pack smaller wounds  Can switch to wound VAC on the larger pannicular wound starting tomorrow.  Pain control.  Diabetic control.  Covid positive but no obvious decline.  Defer  to primary service continue with precautions  Do not anticipate repeat surgery  Surgery will follow with primary service. -VTE prophylaxis- SCDs, etc -mobilize as tolerated to help recovery  30 minutes spent in review, evaluation, examination, counseling, and coordination of care.  More than 50% of that time was spent in counseling.  05/03/2020    Subjective: (Chief complaint)  Appreciative of care.  Very sensitive that her wound is with pain.  Receiving Dilaudid.  Dressing change being done by nursing and myself   Objective:  Vital signs:  Vitals:   05/03/20 0300 05/03/20 0400 05/03/20 0500 05/03/20 0600  BP: (!) 172/84 (!) 143/71 (!) 144/76 (!) 164/88  Pulse: 88 87 88 94  Resp: 17 19 (!) 25 14  Temp: 97.7 F (36.5 C)     TempSrc:      SpO2: 100% 97% 98% 100%  Weight:      Height:           Intake/Output   Yesterday:  08/15 0701 - 08/16 0700 In: 2839.1 [P.O.:200; I.V.:2030; IV Piggyback:609.1] Out: 4098 [JXBJY:7829; Blood:10] This shift:  No intake/output data recorded.  Bowel function:  Flatus: YES  BM:  No  Drain: (No drain)   Physical Exam:  General: Pt awake/alert in moderate acute distress.  Tearful and uncomfortable but consolable. Eyes: PERRL, normal EOM.  Sclera clear.  No icterus Neuro: CN II-XII intact w/o focal sensory/motor deficits. Lymph: No head/neck/groin lymphadenopathy Psych:  No delerium/psychosis/paranoia.  Oriented x 4 HENT: Normocephalic, Mucus membranes moist.  No thrush Neck: Supple, No tracheal deviation.  No obvious thyromegaly Chest: No pain to chest wall compression.  Good respiratory excursion.  No audible  wheezing Inframammary anterior chest wall wounds deep but without any major purulence or necrosis  CV:  Pulses intact.  Regular rhythm.  No major extremity edema MS: Normal AROM mjr joints.  No obvious deformity  Abdomen: Soft.  Nondistended.  Large transverse suprapubic wound from panniculectomy with decent  granulation tissue.  Sensitive but viable.  Left groin suprapubic wound without any major purulence..  No evidence of peritonitis.  No incarcerated hernias.  Ext:   No deformity.  No mjr edema.  No cyanosis Skin: No petechiae / purpurea.  No major sores.  Warm and dry    Results:   Cultures: Recent Results (from the past 720 hour(s))  SARS Coronavirus 2 by RT PCR (hospital order, performed in [facility] hospital lab) Nasopharyngeal Nasopharyngeal Swab     Status: Abnormal   Collection Time: 05/02/20  2:58 AM   Specimen: Nasopharyngeal Swab  Result Value Ref Range Status   SARS Coronavirus 2 POSITIVE (A) NEGATIVE Final    Comment: RESULT CALLED TO, READ BACK BY AND VERIFIED WITH: MAYNARD,C AT 0407 ON [id number] BY CHERESNOWSKY,T (NOTE) SARS-CoV-2 target nucleic acids are DETECTED  SARS-CoV-2 RNA is generally detectable in upper respiratory specimens  during the acute phase of infection.  Positive results are indicative  of the presence of the identified virus, but do not rule out bacterial infection or co-infection with other pathogens not detected by the test.  Clinical correlation with patient history and  other diagnostic information is necessary to determine patient infection status.  The expected result is negative.  Fact Sheet for Patients:   BoilerBrush.com.cy   Fact Sheet for Healthcare Providers:   https://pope.com/    This test is not yet approved or cleared by the [place] FDA and  has been authorized for detection and/or diagnosis of SARS-CoV-2 by FDA under an Emergency Use Authorization (EUA).  This EUA will remain in effect (mean ing this test can be used) for the duration of  the COVID-19 declaration under Section 564(b)(1) of the Act, 21 U.S.C. section 360-bbb-3(b)(1), unless the authorization is terminated or revoked sooner.  Performed at [facility], [address] [place], [place] [address]    Culture, blood (routine x 2)     Status: None (Preliminary result)   Collection Time: 05/02/20  5:30 AM   Specimen: BLOOD LEFT HAND  Result Value Ref Range Status   Specimen Description   Final    BLOOD LEFT HAND Performed at [facility], [address] [place], [place] [address]    Special Requests   Final    BOTTLES DRAWN AEROBIC AND ANAEROBIC Blood Culture adequate volume Performed at [facility], [address] [place], [place] [address]    Culture   Final    NO GROWTH < 24 HOURS Performed at [facility], [address], [place], [place] [address]    Report Status PENDING  Incomplete  Culture, blood (routine x 2)     Status: None (Preliminary result)   Collection Time: 05/02/20  5:40 AM   Specimen: BLOOD LEFT WRIST  Result Value Ref Range Status   Specimen Description   Final    BLOOD LEFT WRIST Performed at [facility], [address] [place], [place] [address]    Special Requests   Final    BOTTLES DRAWN AEROBIC AND ANAEROBIC Blood Culture adequate volume Performed at [facility], [address] [place], [place]  [address]    Culture   Final    NO GROWTH < 24 HOURS Performed at [facility], [address], [place], [place] [address]    Report Status PENDING  Incomplete  Aerobic/Anaerobic Culture (surgical/deep wound)     Status: None (Preliminary result)   Collection Time: 05/02/20  1:16 PM   Specimen: Wound  Result Value Ref Range Status   Specimen Description   Final    WOUND Performed at [facility], [address] [place], [place] [address]    Special Requests   Final    NONE Performed at [facility], [address], [place], [place] [address]    Gram Stain   Final    NO WBC SEEN MODERATE GRAM NEGATIVE RODS FEW GRAM POSITIVE COCCI RARE GRAM POSITIVE RODS Performed at [facility], [address] [place], [place] [address]    Culture PENDING   Incomplete   Report Status PENDING  Incomplete  Culture, blood (routine x 2)     Status: None (Preliminary result)   Collection Time: 05/02/20  5:11 PM   Specimen: BLOOD  Result Value Ref Range Status   Specimen Description   Final    BLOOD Performed at [facility], [address] [place], [place] [address]    Special Requests   Final    BOTTLES DRAWN AEROBIC AND ANAEROBIC Blood Culture adequate volume Performed at [facility], [address], [place], [place] [address]    Culture   Final    NO GROWTH < 24 HOURS Performed at [facility], [address], [place], [place] [address]    Report Status PENDING  Incomplete  Culture, blood (routine x 2)     Status: None (Preliminary result)   Collection Time: 05/02/20  5:31 PM   Specimen: BLOOD RIGHT HAND  Result Value Ref Range Status   Specimen Description   Final    BLOOD RIGHT HAND Performed at [facility], [address], [place], [place] [address]    Special Requests   Final    BOTTLES DRAWN AEROBIC AND ANAEROBIC Blood Culture adequate volume Performed at [facility], [address], [place], [place] [address]    Culture   Final    NO GROWTH < 24 HOURS Performed at [facility], [address], [place], [place] [address]    Report Status PENDING  Incomplete    Labs: Results for orders placed or performed during the hospital encounter of 05/02/20 (from the past 48 hour(s))  SARS Coronavirus 2 by RT PCR (hospital order, performed in [facility] hospital lab) Nasopharyngeal Nasopharyngeal Swab     Status: Abnormal   Collection Time: 05/02/20  2:58 AM   Specimen: Nasopharyngeal Swab  Result Value Ref Range   SARS Coronavirus 2 POSITIVE (A) NEGATIVE    Comment: RESULT CALLED TO, READ BACK BY AND VERIFIED WITH: MAYNARD,C AT 0407 ON [id number] BY CHERESNOWSKY,T (NOTE) SARS-CoV-2 target nucleic acids are DETECTED  SARS-CoV-2 RNA is  generally detectable in upper respiratory specimens  during the acute phase of infection.  Positive results are indicative  of the presence of the identified virus, but do not rule out bacterial infection or co-infection with other pathogens not detected by the test.  Clinical correlation with patient history and  other diagnostic information is necessary to determine patient infection status.  The expected result is negative.  Fact Sheet for Patients:  BoilerBrush.com.cy   Fact Sheet for Healthcare Providers:   https://pope.com/    This test is not yet approved or cleared by the [place] FDA and  has been authorized for detection and/or diagnosis of SARS-CoV-2 by FDA under an Emergency Use Authorization (EUA).  This EUA will remain in effect (mean ing this test can be used) for the duration of  the COVID-19 declaration under Section 564(b)(1) of the Act, 21 U.S.C. section 360-bbb-3(b)(1), unless the authorization is terminated or revoked sooner.  Performed at [facility], [address] [place], [place] [address]   Comprehensive metabolic panel     Status: Abnormal   Collection Time: 05/02/20  3:06 AM  Result Value Ref Range   Sodium 129 (L) 135 - 145 mmol/L   Potassium 3.6 3.5 - 5.1 mmol/L   Chloride 100 98 - 111 mmol/L   CO2 18 (L) 22 - 32 mmol/L   Glucose, Bld 342 (H) 70 - 99 mg/dL    Comment: Glucose reference range applies only to samples taken after fasting for at least 8 hours.   BUN 5 (L) 6 - 20 mg/dL   Creatinine, Ser 3.81 0.44 - 1.00 mg/dL   Calcium 8.3 (L) 8.9 - 10.3 mg/dL   Total Protein 7.6 6.5 - 8.1 g/dL   Albumin 3.1 (L) 3.5 - 5.0 g/dL   AST 19 15 - 41 U/L   ALT 16 0 - 44 U/L   Alkaline Phosphatase 82 38 - 126 U/L   Total Bilirubin 0.6 0.3 - 1.2 mg/dL   GFR calc non Af Amer >60 >60 mL/min   GFR calc Af Amer >60 >60 mL/min   Anion gap 11 5 - 15    Comment: Performed at [facility],  [address] [place], [place] [address]  Lactic acid, plasma     Status: None   Collection Time: 05/02/20  3:06 AM  Result Value Ref Range   Lactic Acid, Venous 1.9 0.5 - 1.9 mmol/L    Comment: Performed at [facility], [address] [place], [place] [address]  CBC with Differential     Status: Abnormal   Collection Time: 05/02/20  3:06 AM  Result Value Ref Range   WBC 30.7 (H) 4.0 - 10.5 K/uL   RBC 4.51 3.87 - 5.11 MIL/uL   Hemoglobin 11.0 (L) 12.0 - 15.0 g/dL   HCT 27.7 (L) 36 - 46 %   MCV 77.8 (L) 80.0 - 100.0 fL   MCH 24.4 (L) 26.0 - 34.0 pg   MCHC 31.3 30.0 - 36.0 g/dL   RDW 82.4 23.5 - 36.1 %   Platelets 345 150 - 400 K/uL   nRBC 0.0 0.0 - 0.2 %   Neutrophils Relative % 83 %   Neutro Abs 25.5 (H) 1.7 - 7.7 K/uL   Lymphocytes Relative 6 %   Lymphs Abs 1.9 0.7 - 4.0 K/uL   Monocytes Relative 9 %   Monocytes Absolute 2.6 (H) 0 - 1 K/uL   Eosinophils Relative 0 %   Eosinophils Absolute 0.0 0 - 0 K/uL   Basophils Relative 0 %   Basophils Absolute 0.1 0 - 0 K/uL   WBC Morphology TOXIC GRANULATION    RBC Morphology MORPHOLOGY UNREMARKABLE    Smear Review Normal platelet morphology    Immature Granulocytes 2 %   Abs Immature Granulocytes 0.58 (H) 0.00 - 0.07 K/uL    Comment: Performed at [facility], [address] [place], [place] [address]  Pregnancy, urine     Status: None   Collection Time: 05/02/20  4:22 AM  Result Value Ref Range   Preg Test, Ur NEGATIVE NEGATIVE    Comment:        THE SENSITIVITY OF THIS METHODOLOGY IS >20 mIU/mL. Performed at [facility], [address] [place], [place] [address]   Urinalysis, Routine w reflex microscopic     Status: Abnormal   Collection Time: 05/02/20  4:22 AM  Result Value Ref Range   Color, Urine ORANGE (A) YELLOW    Comment: BIOCHEMICALS MAY BE AFFECTED BY COLOR   APPearance CLOUDY (A) CLEAR   Specific Gravity, Urine 1.010 1.005 - 1.030   pH 6.5 5.0 - 8.0   Glucose, UA >=500  (A) NEGATIVE mg/dL   Hgb urine dipstick TRACE (A) NEGATIVE   Bilirubin Urine NEGATIVE NEGATIVE   Ketones, ur 15 (A) NEGATIVE mg/dL   Protein, ur 098 (A) NEGATIVE mg/dL   Nitrite NEGATIVE NEGATIVE   Leukocytes,Ua TRACE (A) NEGATIVE    Comment: Performed at [facility], [address] [place], [place] [address]  Urinalysis, Microscopic (reflex)     Status: Abnormal   Collection Time: 05/02/20  4:22 AM  Result Value Ref Range   RBC / HPF 0-5 0 - 5 RBC/hpf   WBC, UA 6-10 0 - 5 WBC/hpf   Bacteria, UA MANY (A) NONE SEEN   Squamous Epithelial / LPF 11-20 0 - 5    Comment: Performed at [facility], [address] [place], [place] [address]  Culture, blood (routine x 2)     Status: None (Preliminary result)   Collection Time: 05/02/20  5:30 AM   Specimen: BLOOD LEFT HAND  Result Value Ref Range   Specimen Description      BLOOD LEFT HAND Performed at [facility], [address] [place], [place] [address]    Special Requests      BOTTLES DRAWN AEROBIC AND ANAEROBIC Blood Culture adequate volume Performed at [facility], [address] [place], [place] [address]    Culture      NO GROWTH < 24 HOURS Performed at [facility], [address], [place], [place] [address]    Report Status PENDING   Sedimentation rate     Status: Abnormal   Collection Time: 05/02/20  5:31 AM  Result Value Ref Range   Sed Rate 84 (H) 0 - 22 mm/hr    Comment: Performed at [facility], [address] [place], [place] [address]  Culture, blood (routine x 2)     Status: None (Preliminary result)   Collection Time: 05/02/20  5:40 AM   Specimen: BLOOD LEFT WRIST  Result Value Ref Range   Specimen Description      BLOOD LEFT WRIST Performed at [facility], [address] [place], [place] [address]    Special Requests      BOTTLES DRAWN AEROBIC AND ANAEROBIC Blood Culture adequate volume Performed at [facility], [address] [place], [place] [address]    Culture      NO GROWTH < 24 HOURS Performed at [facility], [address] [place], [place] [address]    Report Status PENDING   C-reactive protein     Status: Abnormal   Collection Time: 05/02/20  5:40 AM  Result Value Ref Range   CRP 22.7 (H) <1.0 mg/dL    Comment: Performed at [facility], [address], [place], [place] [address]  Hemoglobin A1c     Status: Abnormal   Collection Time: 05/02/20  5:43 AM  Result Value Ref Range   Hgb A1c MFr Bld 10.5 (H) 4.8 - 5.6 %    Comment: (NOTE) Pre diabetes:          5.7%-6.4%  Diabetes:              >6.4%  Glycemic control for   <7.0% adults with diabetes    Mean Plasma Glucose 254.65 mg/dL    Comment: Performed at [facility], [address] [place], [place] [address]  CBG monitoring, ED     Status: Abnormal   Collection Time: 05/02/20  6:10 AM  Result Value Ref Range   Glucose-Capillary 301 (H) 70 - 99 mg/dL    Comment: Glucose reference range applies only to samples taken after fasting for at least 8 hours.  CBG monitoring, ED     Status: Abnormal   Collection Time: 05/02/20  9:38 AM  Result Value Ref Range   Glucose-Capillary 238 (H) 70 - 99 mg/dL    Comment: Glucose reference range applies only to samples taken after fasting for at least 8 hours.  Glucose, capillary     Status: Abnormal   Collection Time: 05/02/20 12:08 PM  Result Value Ref Range   Glucose-Capillary 242 (H) 70 - 99 mg/dL    Comment: Glucose reference range applies only to samples taken after fasting for at least 8 hours.  Aerobic/Anaerobic Culture (surgical/deep wound)     Status: None (Preliminary result)   Collection Time: 05/02/20  1:16 PM   Specimen: Wound  Result Value Ref Range   Specimen Description      WOUND Performed at [facility], [address] [place], [place] [address]    Special Requests      NONE Performed at [facility], [address] [place], [place] [address]    Gram Stain      NO WBC SEEN MODERATE GRAM NEGATIVE RODS FEW GRAM POSITIVE COCCI RARE GRAM POSITIVE RODS Performed at [facility], [address], [place], [place] [address]    Culture PENDING    Report Status PENDING   Glucose, capillary     Status: Abnormal   Collection Time: 05/02/20  1:23 PM  Result Value Ref Range   Glucose-Capillary 223 (H) 70 - 99 mg/dL    Comment: Glucose reference range applies only to samples taken after fasting for at least 8 hours.  Glucose, capillary     Status: Abnormal   Collection Time: 05/02/20  4:47 PM  Result Value Ref Range   Glucose-Capillary 158 (H) 70 - 99 mg/dL    Comment: Glucose reference range applies only to samples taken after fasting for at least 8 hours.   Comment 1 Notify RN    Comment 2 Document in Chart   CBC     Status: Abnormal   Collection Time: 05/02/20  5:11 PM  Result Value Ref Range   WBC 41.5 (H) 4.0 - 10.5 K/uL   RBC 3.58 (L) 3.87 - 5.11 MIL/uL   Hemoglobin 8.8 (L) 12.0 - 15.0 g/dL   HCT 13.0 (L) 36 - 46 %   MCV 79.6 (L) 80.0 - 100.0 fL   MCH 24.6 (L) 26.0 - 34.0 pg  MCHC 30.9 30.0 - 36.0 g/dL   RDW 16.1 (H) 09.6 - 04.5 %   Platelets 365 150 - 400 K/uL   nRBC 0.0 0.0 - 0.2 %    Comment: Performed at [facility], [address] [place], [place] [address]  Creatinine, serum     Status: None   Collection Time: 05/02/20  5:11 PM  Result Value Ref Range   Creatinine, Ser 0.63 0.44 - 1.00 mg/dL   GFR calc non Af Amer >60 >60 mL/min   GFR calc Af Amer >60 >60 mL/min    Comment: Performed at [facility], [address], [place], [place] [address]  Magnesium     Status: None   Collection Time: 05/02/20  5:11 PM  Result Value Ref Range   Magnesium 1.8 1.7 - 2.4 mg/dL    Comment: Performed at [facility], [address], [place], [place] [address]  Hemoglobin A1c     Status: Abnormal   Collection Time: 05/02/20   5:11 PM  Result Value Ref Range   Hgb A1c MFr Bld 10.4 (H) 4.8 - 5.6 %    Comment: (NOTE) Pre diabetes:          5.7%-6.4%  Diabetes:              >6.4%  Glycemic control for   <7.0% adults with diabetes    Mean Plasma Glucose 251.78 mg/dL    Comment: Performed at [facility], [address] [place], [place] [address]  Culture, blood (routine x 2)     Status: None (Preliminary result)   Collection Time: 05/02/20  5:11 PM   Specimen: BLOOD  Result Value Ref Range   Specimen Description      BLOOD Performed at [facility], [address], [place], [place] [address]    Special Requests      BOTTLES DRAWN AEROBIC AND ANAEROBIC Blood Culture adequate volume Performed at [facility], [address], [place], [place] [address]    Culture      NO GROWTH < 24 HOURS Performed at [facility], [address] [place], [place] [address]    Report Status PENDING   Culture, blood (routine x 2)     Status: None (Preliminary result)   Collection Time: 05/02/20  5:31 PM   Specimen: BLOOD RIGHT HAND  Result Value Ref Range   Specimen Description      BLOOD RIGHT HAND Performed at [facility], [address] [place], [place] [address]    Special Requests      BOTTLES DRAWN AEROBIC AND ANAEROBIC Blood Culture adequate volume Performed at [facility], [address] [place], [place] [address]    Culture      NO GROWTH < 24 HOURS Performed at [facility], [address], [place], [place] [address]    Report Status PENDING   Glucose, capillary     Status: Abnormal   Collection Time: 05/02/20 11:27 PM  Result Value Ref Range   Glucose-Capillary 143 (H) 70 - 99 mg/dL    Comment: Glucose reference range applies only to samples taken after fasting for at least 8 hours.   Comment 1 Notify RN    Comment 2 Document in Chart   Glucose, capillary     Status: None   Collection Time: 05/03/20   3:07 AM  Result Value Ref Range   Glucose-Capillary 99 70 -  99 mg/dL    Comment: Glucose reference range applies only to samples taken after fasting for at least 8 hours.  Cortisol-am, blood     Status: None   Collection Time: 05/03/20  3:10 AM  Result Value Ref Range   Cortisol - AM 14.4 6.7 - 22.6 ug/dL    Comment: Performed at [facility], [address] [place], [place] [address]  CBC     Status: Abnormal   Collection Time: 05/03/20  3:10 AM  Result Value Ref Range   WBC 32.5 (H) 4.0 - 10.5 K/uL   RBC 4.18 3.87 - 5.11 MIL/uL   Hemoglobin 10.1 (L) 12.0 - 15.0 g/dL   HCT 85.4 (L) 36 - 46 %   MCV 81.3 80.0 - 100.0 fL   MCH 24.2 (L) 26.0 - 34.0 pg   MCHC 29.7 (L) 30.0 - 36.0 g/dL   RDW 62.7 (H) 03.5 - 00.9 %   Platelets 350 150 - 400 K/uL   nRBC 0.0 0.0 - 0.2 %    Comment: Performed at [facility], [address], [place], [place] [address]  Basic metabolic panel     Status: Abnormal   Collection Time: 05/03/20  3:10 AM  Result Value Ref Range   Sodium 140 135 - 145 mmol/L   Potassium 3.3 (L) 3.5 - 5.1 mmol/L   Chloride 106 98 - 111 mmol/L   CO2 24 22 - 32 mmol/L   Glucose, Bld 107 (H) 70 - 99 mg/dL    Comment: Glucose reference range applies only to samples taken after fasting for at least 8 hours.   BUN 8 6 - 20 mg/dL   Creatinine, Ser 9.93 0.44 - 1.00 mg/dL   Calcium 8.1 (L) 8.9 - 10.3 mg/dL   GFR calc non Af Amer >60 >60 mL/min   GFR calc Af Amer >60 >60 mL/min   Anion gap 10 5 - 15    Comment: Performed at [facility], [address], [place], [place] [address]    Imaging / Studies: CT ABDOMEN PELVIS WO CONTRAST  Result Date: 05/02/2020 CLINICAL DATA:  Right lower abdominal pain with fevers EXAM: CT ABDOMEN AND PELVIS WITHOUT CONTRAST TECHNIQUE: Multidetector CT imaging of the abdomen and pelvis was performed following the standard protocol without IV contrast. COMPARISON:  10/17/2019 FINDINGS: Lower chest: Lung bases  are clear. Minimal pericardial thickening is noted although improved when compared with the prior exam. Heart is mildly enlarged. Hepatobiliary: No focal liver abnormality is seen. Status post cholecystectomy. No biliary dilatation. Pancreas: Unremarkable. No pancreatic ductal dilatation or surrounding inflammatory changes. Spleen: Normal in size without focal abnormality. Adrenals/Urinary Tract: Adrenal glands are within normal limits bilaterally. Kidneys are well visualized bilaterally without renal calculi or obstructive changes. Mild perinephric stranding is noted which may be related to underlying UTI. The bladder is decompressed. Stomach/Bowel: No obstructive or inflammatory changes of the colon are seen. The appendix is within normal limits. No inflammatory changes are noted. Small bowel and stomach are unremarkable. Vascular/Lymphatic: No significant vascular findings are present. No enlarged abdominal or pelvic lymph nodes. Reproductive: Uterus is well visualized and within normal limits. The previously seen left ovarian abnormality has reduced in size consistent with prior tubo-ovarian abscess with treatment. No definitive ovarian abnormality is seen. Other: Small amount of free fluid is noted in the pelvic cul-de-sac which may be physiologic in nature Musculoskeletal: No acute bony abnormality is noted. In the anterior abdominal wall inferiorly and eccentric to  the right, there is significant subcutaneous air and inflammatory change consistent with a focal cellulitis. Minimal fluid is noted. IMPRESSION: Changes most consistent with cellulitis/panniculitis in the right lower abdominal wall. Minimal fluid is noted although a considerable amount of subcutaneous air and emphysema is seen. No drainable collection is seen. Resolution of previously seen left tubo-ovarian abscess. Mild perinephric stranding which may be related to underlying UTI. Clinical correlation is recommended. Electronically Signed   By:  Alcide Clever M.D.   On: 05/02/2020 05:07   DG Chest Portable 1 View  Result Date: 05/02/2020 CLINICAL DATA:  Cough and fevers EXAM: PORTABLE CHEST 1 VIEW COMPARISON:  03/25/2020 FINDINGS: Cardiac shadow remains enlarged. The lungs are well aerated bilaterally. No focal infiltrate or effusion is seen. No bony abnormality is noted. IMPRESSION: No acute abnormality noted. Electronically Signed   By: Alcide Clever M.D.   On: 05/02/2020 05:08    Medications / Allergies: per chart  Antibiotics: Anti-infectives (From admission, onward)   Start     Dose/Rate Route Frequency Ordered Stop   05/03/20 1700  fluconazole (DIFLUCAN) IVPB 100 mg     Discontinue     100 mg 50 mL/hr over 60 Minutes Intravenous Every 24 hours 05/02/20 1603     05/02/20 2000  vancomycin (VANCOREADY) IVPB 1250 mg/250 mL  Status:  Discontinued        1,250 mg 166.7 mL/hr over 90 Minutes Intravenous Every 12 hours 05/02/20 0541 05/02/20 1022   05/02/20 1700  metroNIDAZOLE (FLAGYL) IVPB 500 mg     Discontinue     500 mg 100 mL/hr over 60 Minutes Intravenous Every 8 hours 05/02/20 1603     05/02/20 1615  vancomycin (VANCOCIN) IVPB 1000 mg/200 mL premix  Status:  Discontinued        1,000 mg 200 mL/hr over 60 Minutes Intravenous  Once 05/02/20 1603 05/02/20 1613   05/02/20 1615  fluconazole (DIFLUCAN) IVPB 200 mg        200 mg 100 mL/hr over 60 Minutes Intravenous  Once 05/02/20 1603 05/02/20 1744   05/02/20 1400  vancomycin (VANCOREADY) IVPB 1250 mg/250 mL     Discontinue     1,250 mg 166.7 mL/hr over 90 Minutes Intravenous Every 8 hours 05/02/20 1022     05/02/20 1315  clindamycin (CLEOCIN) IVPB 900 mg  Status:  Discontinued        900 mg 100 mL/hr over 30 Minutes Intravenous On call to O.R. 05/02/20 0952 05/02/20 1527   05/02/20 1200  meropenem (MERREM) 1 g in sodium chloride 0.9 % 100 mL IVPB  Status:  Discontinued        1 g 200 mL/hr over 30 Minutes Intravenous Every 8 hours 05/02/20 1019 05/02/20 1603   05/02/20 1030   vancomycin (VANCOREADY) IVPB 1500 mg/300 mL  Status:  Discontinued        1,500 mg 150 mL/hr over 120 Minutes Intravenous NOW 05/02/20 1019 05/02/20 1020   05/02/20 0544  vancomycin (VANCOCIN) 1000 MG powder       Note to Pharmacy: Gordy Savers   : cabinet override      05/02/20 0544 05/02/20 0611   05/02/20 0530  piperacillin-tazobactam (ZOSYN) IVPB 3.375 g        3.375 g 100 mL/hr over 30 Minutes Intravenous  Once 05/02/20 0519 05/02/20 0633   05/02/20 0530  clindamycin (CLEOCIN) IVPB 600 mg        600 mg 100 mL/hr over 30 Minutes Intravenous  Once 05/02/20 0519 05/02/20  0730   05/02/20 0530  vancomycin (VANCOREADY) IVPB 2000 mg/400 mL     Discontinue     2,000 mg 200 mL/hr over 120 Minutes Intravenous  Once 05/02/20 [id number]          Note: Portions of this report may have been transcribed using voice recognition software. Every effort was made to ensure accuracy; however, inadvertent computerized transcription errors may be present.   Any transcriptional errors that result from this process are unintentional.    Ardeth SportsmanSteven C. Deanta Mincey, MD, FACS, MASCRS Gastrointestinal and Minimally Invasive Surgery  [facility] [address] [place], [place] [address] [phone] Fax [phone] Main/Paging  CONTACT INFORMATION: Weekday (9AM-5PM) concerns: Call [facility] main office at [phone] Weeknight (5PM-9AM) or Weekend/Holiday concerns: Check [email or web address] for General Surgery [facility] coverage (Please, do not use SecureChat as it is not reliable communication to operating surgeons for immediate patient care)      05/03/2020  8:04 AM

## 2020-05-03 NOTE — Care Plan (Signed)
Rn called into room by pt. Pt's abdominal dressing and bed found to be saturated with drainage from abdominal wound, with ABD pads falling off pt. RN left original packing in wound and recovered wounds with new ABD pads that had become dislodged.

## 2020-05-03 NOTE — Progress Notes (Addendum)
PROGRESS NOTE    Carolyn Zimmerman  AUQ:[id number] DOB: 1985/12/17 DOA: 05/02/2020 PCP: System, Pcp Not In   Brief Narrative:  Carolyn Zimmerman is a 34 y.o. female past medical history of morbid obesity diabetes mellitus 9.4 and hypertension who comes into the ED for fever nausea diarrhea body aches and abdominal pain that started the day prior to admission she relates her pain in the abdomen is more in the right lower quadrant with a palpable mass tender to touch.  She denies any cuts or sores no vaginal bleeding. She was found to be mildly tachycardic blood pressure stable saturations greater than 95%, white blood cell count of 30,000 with a left shift, lactic acid 1.9 hyponatremic with a blood glucose of 300, UA appears to be infected blood cultures were drawn she was started empirically on IV vanc cefepime and clindamycin.  Assessment & Plan:   Principal Problem:   Panniuclitis with abdominal wall abscess s/p panniculectomy 05/02/2020 Active Problems:   Chest wall abscesses x 2 s/p I&D 05/02/2020   Hyperglycemia   Hx of medication noncompliance   Uncontrolled hypertension   Type 2 diabetes mellitus with hyperglycemia (HCC)   Obesity, Class III, BMI 40-49.9 (morbid obesity) (HCC)   COVID-19 virus infection   Necrotizing fasciitis (HCC)   Sepsis (HCC)   Hidradenitis suppurativa of axillae,chest wall, groins  Sepsis due to necrotizing fasciitis with Abdominal wall abscess, POA: - S/P I/D excision of abdominal wall abscess and paniculutis 05/02/20 - Surgery consulted and following, defer to surgery for ongoing DVT prophylaxis and dietary needs.  Currently on clears -will advance to diabetic diet as below. - Continue cefepime, Flagyl, vancomycin.  Recently deescalated from meropenem on 05/03/2020. - Continue to follow cultures - preliminary negative  Hyperglycemia with uncontrolled diabetes mellitus type 2, non insulin dependent, POA: - Would resume a diabetic diet once cleared by surgery -  Levemir 20 BID ongoing - continue to titrate, glucose markedly uncontrolled overnight. Lab Results  Component Value Date   HGBA1C 10.4 (H) 05/02/2020   Incidental COVID-19 viral infection, POA: - She denies any cough shortness of breath, x-ray shows no infiltrates, CT scan cut the bases of the lung does not show any infiltrates. - Sure at this time due to her severe infection her inflammatory markers will be significantly elevated due to her necrotizing fasciitis. At this point in time I will not start IV remdesivir steroids. - We will continue to monitor. Recent Labs    05/02/20 0540  CRP 22.7*   Lab Results  Component Value Date   SARSCOV2NAA POSITIVE (A) 05/02/2020   SARSCOV2NAA NEGATIVE 03/25/2020   SARSCOV2NAA NEGATIVE 10/17/2019   SARSCOV2NAA NOT DETECTED 05/30/2019   Microcytic anemia, likely iron deficient/blood loss in the setting of menstruation: -Post op labs stable - continue to follow Lab Results  Component Value Date   WBC 32.5 (H) 05/03/2020   HGB 10.1 (L) 05/03/2020   HCT 34.0 (L) 05/03/2020   MCV 81.3 05/03/2020   PLT 350 05/03/2020   Obesity, Class III, BMI 40-49.9 (morbid obesity) (HCC) Counseling  Candidal intertrigo, POA: Continue on IV Diflucan.   DVT Prophylaxis: lovenox Code Status: full  Family Communication: None  Status is: Inpatient  Dispo: The patient is from: Home              Anticipated d/c is to: Home              Anticipated d/c date is: Pending clinical course, 72 to 96 hours  Patient currently not medically stable for discharge due to ongoing need for IV antibiotics, close monitoring postsurgical status  Consultants:   General surgery  Procedures:  PANNICULECTOMY WITH EXCISION OF ABDOMINAL WALL ABSCESSES & PANNICULITIS INCISION AND DEBRIDEMENTOFCHEST WALL ABSCESS X 2 INCISION & DEBRIDEMENT OFMONS PUBIS ABSCESS X1  Antimicrobials:  Meropenem 05/02/2020-05/03/2020 Vancomycin 05/02/2020-ongoing Cefepime  05/02/2020-ongoing Flagyl 05/02/2020-ongoing  Subjective: No acute issues or events overnight, patient tolerated bandage change well this morning with only minimal pain, otherwise denies nausea, vomiting, diarrhea, constipation, headache, fevers, chills.  Objective: Vitals:   05/03/20 0900 05/03/20 1126 05/03/20 1127 05/03/20 1200  BP: (!) 192/85 (!) 172/94  (!) 164/89  Pulse: (!) 102 93  89  Resp: 18 17  (!) 26  Temp:   98.2 F (36.8 C)   TempSrc:   Oral   SpO2: 100% 100%  100%  Weight:      Height:        Intake/Output Summary (Last 24 hours) at 05/03/2020 1342 Last data filed at 05/03/2020 1127 Gross per 24 hour  Intake 1239.06 ml  Output 2225 ml  Net -985.94 ml   Filed Weights   05/02/20 0235 05/02/20 1645  Weight: 128.8 kg 128.5 kg    Examination:  General exam: Appears calm and comfortable  Respiratory system: Clear to auscultation. Respiratory effort normal. Cardiovascular system: S1 & S2 heard, RRR. No JVD, murmurs, rubs, gallops or clicks. No pedal edema. Gastrointestinal system: Abdominal bandage clean dry intact, hypoactive bowel sounds, minimal tenderness at the upper left and right quadrants; moderate tenderness lower abdomen Central nervous system: Alert and oriented. No focal neurological deficits. Extremities: Symmetric 5 x 5 power. Skin: Abdominal bandage clean dry intact.  Data Reviewed: I have personally reviewed following labs and imaging studies  CBC: Recent Labs  Lab 05/02/20 0306 05/02/20 1711 05/03/20 0310  WBC 30.7* 41.5* 32.5*  NEUTROABS 25.5*  --   --   HGB 11.0* 8.8* 10.1*  HCT 35.1* 28.5* 34.0*  MCV 77.8* 79.6* 81.3  PLT 345 365 350   Basic Metabolic Panel: Recent Labs  Lab 05/02/20 0306 05/02/20 1711 05/03/20 0310  NA 129*  --  140  K 3.6  --  3.3*  CL 100  --  106  CO2 18*  --  24  GLUCOSE 342*  --  107*  BUN 5*  --  8  CREATININE 0.79 0.63 0.70  CALCIUM 8.3*  --  8.1*  MG  --  1.8 1.8  PHOS  --   --  2.8    GFR: Estimated Creatinine Clearance: 142.2 mL/min (by C-G formula based on SCr of 0.7 mg/dL). Liver Function Tests: Recent Labs  Lab 05/02/20 0306  AST 19  ALT 16  ALKPHOS 82  BILITOT 0.6  PROT 7.6  ALBUMIN 3.1*   No results for input(s): LIPASE, AMYLASE in the last 168 hours. No results for input(s): AMMONIA in the last 168 hours. Coagulation Profile: No results for input(s): INR, PROTIME in the last 168 hours. Cardiac Enzymes: No results for input(s): CKTOTAL, CKMB, CKMBINDEX, TROPONINI in the last 168 hours. BNP (last 3 results) No results for input(s): PROBNP in the last 8760 hours. HbA1C: Recent Labs    05/02/20 0543 05/02/20 1711  HGBA1C 10.5* 10.4*   CBG: Recent Labs  Lab 05/02/20 1947 05/02/20 2327 05/03/20 0307 05/03/20 0806 05/03/20 1126  GLUCAP 170* 143* 99 201* 171*   Lipid Profile: No results for input(s): CHOL, HDL, LDLCALC, TRIG, CHOLHDL, LDLDIRECT in the last 72 hours.  Thyroid Function Tests: No results for input(s): TSH, T4TOTAL, FREET4, T3FREE, THYROIDAB in the last 72 hours. Anemia Panel: No results for input(s): VITAMINB12, FOLATE, FERRITIN, TIBC, IRON, RETICCTPCT in the last 72 hours. Sepsis Labs: Recent Labs  Lab 05/02/20 0306  LATICACIDVEN 1.9    Recent Results (from the past 240 hour(s))  SARS Coronavirus 2 by RT PCR (hospital order, [facility] in [facility]) Nasopharyngeal Nasopharyngeal Swab     Status: Abnormal   Collection Time: 05/02/20  2:58 AM   Specimen: Nasopharyngeal Swab  Result Value Ref Range Status   SARS Coronavirus 2 POSITIVE (A) NEGATIVE Final    Comment: RESULT CALLED TO, READ BACK BY AND VERIFIED WITH: MAYNARD,C AT 0407 ON [id number] BY CHERESNOWSKY,T (NOTE) SARS-CoV-2 target nucleic acids are DETECTED  SARS-CoV-2 RNA is generally detectable in upper respiratory specimens  during the acute phase of infection.  Positive results are indicative  of the presence of the identified virus, but do not rule  out bacterial infection or co-infection with other pathogens not detected by the test.  Clinical correlation with patient history and  other diagnostic information is necessary to determine patient infection status.  The expected result is negative.  Fact Sheet for Patients:   BoilerBrush.com.cyhttps://www.fda.gov/media/136312/download   Fact Sheet for Healthcare Providers:   https://pope.com/https://www.fda.gov/media/136313/download    This test is not yet approved or cleared by the [place] FDA and  has been authorized for detection and/or diagnosis of SARS-CoV-2 by FDA under an Emergency Use Authorization (EUA).  This EUA will remain in effect (mean ing this test can be used) for the duration of  the COVID-19 declaration under Section 564(b)(1) of the Act, 21 U.S.C. section 360-bbb-3(b)(1), unless the authorization is terminated or revoked sooner.  Performed at [facility] [place], [address] [place], [place] [address]   Culture, blood (routine x 2)     Status: None (Preliminary result)   Collection Time: 05/02/20  5:30 AM   Specimen: BLOOD LEFT HAND  Result Value Ref Range Status   Specimen Description   Final    BLOOD LEFT HAND Performed at [facility] [place], [address] [place], [place] [address]    Special Requests   Final    BOTTLES DRAWN AEROBIC AND ANAEROBIC Blood Culture adequate volume Performed at [facility] [place], [address] [place], [place] [address]    Culture   Final    NO GROWTH < 24 HOURS Performed at [facility], [address] [place], [place] [address]    Report Status PENDING  Incomplete  Culture, blood (routine x 2)     Status: None (Preliminary result)   Collection Time: 05/02/20  5:40 AM   Specimen: BLOOD LEFT WRIST  Result Value Ref Range Status   Specimen Description   Final    BLOOD LEFT WRIST Performed at [facility] [place], [address] [place], [place] [address]    Special Requests   Final    BOTTLES DRAWN AEROBIC AND  ANAEROBIC Blood Culture adequate volume Performed at [facility] [place], [address] [place], [place] [address]    Culture   Final    NO GROWTH < 24 HOURS Performed at [facility], [address] [place], [place] [address]    Report Status PENDING  Incomplete  Aerobic/Anaerobic Culture (surgical/deep wound)     Status: None (Preliminary result)   Collection Time: 05/02/20  1:16 PM  Specimen: Wound  Result Value Ref Range Status   Specimen Description   Final    WOUND Performed at [facility], [address], [place], [place] [address]    Special Requests   Final    NONE Performed at [facility] [place], [address], [place], [place] [address]    Gram Stain   Final    NO WBC SEEN MODERATE GRAM NEGATIVE RODS FEW GRAM POSITIVE COCCI RARE GRAM POSITIVE RODS    Culture   Final    CULTURE REINCUBATED FOR BETTER GROWTH Performed at [facility], [address], [place], [place] [address]    Report Status PENDING  Incomplete  Culture, blood (routine x 2)     Status: None (Preliminary result)   Collection Time: 05/02/20  5:11 PM   Specimen: BLOOD  Result Value Ref Range Status   Specimen Description   Final    BLOOD Performed at [facility], [address], [place], [place] [address]    Special Requests   Final    BOTTLES DRAWN AEROBIC AND ANAEROBIC Blood Culture adequate volume Performed at [facility], [address], [place], [place] [address]    Culture   Final    NO GROWTH < 24 HOURS Performed at [facility], [address] [place], [place] [address]    Report Status PENDING  Incomplete  Culture, blood (routine x 2)     Status: None (Preliminary result)   Collection Time: 05/02/20  5:31 PM   Specimen: BLOOD RIGHT HAND  Result Value Ref Range Status   Specimen Description   Final    BLOOD RIGHT HAND Performed at [facility], [address] [place], [place] [address]    Special Requests   Final    BOTTLES DRAWN AEROBIC AND ANAEROBIC Blood Culture adequate volume Performed at [facility], [address], [place], [place] [address]    Culture   Final    NO GROWTH < 24 HOURS Performed at [facility], [address] [place], [place] [address]    Report Status PENDING  Incomplete     Radiology Studies: CT ABDOMEN PELVIS WO CONTRAST  Result Date: 05/02/2020 CLINICAL DATA:  Right lower abdominal pain with fevers EXAM: CT ABDOMEN AND PELVIS WITHOUT CONTRAST TECHNIQUE: Multidetector CT imaging of the abdomen and pelvis was performed following the standard protocol without IV contrast. COMPARISON:  10/17/2019 FINDINGS: Lower chest: Lung bases are clear. Minimal pericardial thickening is noted although improved when compared with the prior exam. Heart is mildly enlarged. Hepatobiliary: No focal liver abnormality is seen. Status post cholecystectomy. No biliary dilatation. Pancreas: Unremarkable. No pancreatic ductal dilatation or surrounding inflammatory changes. Spleen: Normal in size without focal abnormality. Adrenals/Urinary Tract: Adrenal glands are within normal limits bilaterally. Kidneys are well visualized bilaterally without renal calculi or obstructive changes. Mild perinephric stranding is noted which may be related to underlying UTI. The bladder is decompressed. Stomach/Bowel: No obstructive or inflammatory changes of the colon are seen. The appendix is within normal limits. No inflammatory changes are noted. Small bowel and stomach are unremarkable. Vascular/Lymphatic: No significant vascular findings are present. No enlarged abdominal or pelvic lymph nodes. Reproductive: Uterus is well visualized and within normal limits. The previously seen left ovarian abnormality has reduced in size consistent with prior tubo-ovarian abscess with treatment. No definitive ovarian abnormality is seen. Other: Small amount  of free fluid is noted in  the pelvic cul-de-sac which may be physiologic in nature Musculoskeletal: No acute bony abnormality is noted. In the anterior abdominal wall inferiorly and eccentric to the right, there is significant subcutaneous air and inflammatory change consistent with a focal cellulitis. Minimal fluid is noted. IMPRESSION: Changes most consistent with cellulitis/panniculitis in the right lower abdominal wall. Minimal fluid is noted although a considerable amount of subcutaneous air and emphysema is seen. No drainable collection is seen. Resolution of previously seen left tubo-ovarian abscess. Mild perinephric stranding which may be related to underlying UTI. Clinical correlation is recommended. Electronically Signed   By: Alcide Clever M.D.   On: 05/02/2020 05:07   DG Chest Portable 1 View  Result Date: 05/02/2020 CLINICAL DATA:  Cough and fevers EXAM: PORTABLE CHEST 1 VIEW COMPARISON:  03/25/2020 FINDINGS: Cardiac shadow remains enlarged. The lungs are well aerated bilaterally. No focal infiltrate or effusion is seen. No bony abnormality is noted. IMPRESSION: No acute abnormality noted. Electronically Signed   By: Alcide Clever M.D.   On: 05/02/2020 05:08    Scheduled Meds: . acetaminophen  1,000 mg Oral TID  . Chlorhexidine Gluconate Cloth  6 each Topical Daily  . enoxaparin (LOVENOX) injection  40 mg Subcutaneous Q24H  . feeding supplement (GLUCERNA SHAKE)  237 mL Oral BID BM  . gabapentin  400 mg Oral TID  . insulin aspart  0-20 Units Subcutaneous Q4H  . insulin detemir  20 Units Subcutaneous BID  . lip balm  1 application Topical BID  . mouth rinse  15 mL Mouth Rinse BID  . potassium chloride  40 mEq Oral Daily  . psyllium  1 packet Oral BID   Continuous Infusions: . sodium chloride 10 mL/hr at 05/02/20 0603  . sodium chloride Stopped (05/02/20 1000)  . ceFEPime (MAXIPIME) IV Stopped (05/03/20 1308)  . lactated ringers    . metronidazole 500 mg (05/03/20 1235)  .  vancomycin Stopped (05/03/20 0725)  . vancomycin       LOS: 1 day   Time spent:  Azucena Fallen, DO [facility]  If 7PM-7AM, please contact night-coverage [email or web address]  05/03/2020, 1:42 PM

## 2020-05-03 NOTE — TOC Initial Note (Signed)
Transition of Care [facility]) - Initial/Assessment Note    Patient Details  Name: Carolyn Zimmerman MRN: [id number] Date of Birth: 09-17-1986  Transition of [facility]) CM/SW Contact:    Golda Acre, RN Phone Number: 05/03/2020, 8:26 AM  Clinical Narrative:                 panniuclitis and abscess and requiring panniculectomy.  Open lower abd wound,lives by self. Diabetic hgb a!C=10.4. wbc 32.5, iv abx ongoing. Will follow for possible hhc , no pcp and plan is to return to home.  Expected Discharge Plan: Home w Home Health Services Barriers to Discharge: Continued Medical Work up   Patient Goals and CMS Choice Patient states their goals for this hospitalization and ongoing recovery are:: to go home CMS Medicare.gov Compare Post Acute Care list provided to:: Patient    Expected Discharge Plan and Services Expected Discharge Plan: Home w Home Health Services   Discharge Planning Services: CM Consult   Living arrangements for the past 2 months: Single Family Home                                      Prior Living Arrangements/Services Living arrangements for the past 2 months: Single Family Home Lives with:: Self Patient language and need for interpreter reviewed:: Yes Do you feel safe going back to the place where you live?: Yes      Need for Family Participation in Patient Care: Yes (Comment) Care giver support system in place?: No (comment)   Criminal Activity/Legal Involvement Pertinent to Current Situation/Hospitalization: No - Comment as needed  Activities of Daily Living      Permission Sought/Granted                  Emotional Assessment Appearance:: Appears stated age Attitude/Demeanor/Rapport: Engaged Affect (typically observed): Calm Orientation: : Oriented to Place, Oriented to  Time, Oriented to Self, Oriented to Situation Alcohol / Substance Use: Not Applicable Psych Involvement: No (comment)  Admission diagnosis:  Necrotizing fasciitis  (HCC) [M72.6] Acute panniculitis [M79.3] COVID-19 [U07.1] Patient Active Problem List   Diagnosis Date Noted  . Panniuclitis with abdominal wall abscess s/p panniculectomy 05/02/2020 05/02/2020  . Obesity, Class III, BMI 40-49.9 (morbid obesity) (HCC) 05/02/2020  . COVID-19 virus infection 05/02/2020  . Panniculitis abdominal wall 05/02/2020  . Necrotizing fasciitis (HCC) 05/02/2020  . Sepsis (HCC) 05/02/2020  . Hidradenitis suppurativa of axillae,chest wall, groins 05/02/2020  . Type 2 diabetes mellitus with hyperglycemia (HCC) 10/18/2019  . TOA (tubo-ovarian abscess) 10/17/2019  . Elevated LFTs 09/16/2018  . DM (diabetes mellitus), type 2, uncontrolled (HCC) 09/16/2018  . Uncontrolled hypertension 09/16/2018  . Increased frequency of urination 09/16/2018  . Bile duct stricture 07/30/2018  . Chest wall abscesses x 2 s/p I&D 05/02/2020 06/15/2017  . Hyperglycemia 06/15/2017  . Hx of medication noncompliance    PCP:  System, Pcp Not In Pharmacy:   [id number] - [place], [place] - [address] [place] [address] Phone: [phone] Fax: [phone]  [facility] - [place], [place] - [address] [place] [address] Phone: [phone] Fax: [phone]  [id number] [place], [place] - [address] AT [facility] & [address] [place] [address]  Phone: [phone] Fax: [phone]     Social Determinants of Health (SDOH) Interventions    Readmission Risk Interventions No flowsheet data found.

## 2020-05-04 LAB — COMPREHENSIVE METABOLIC PANEL
ALT: 12 U/L (ref 0–44)
AST: 9 U/L — ABNORMAL LOW (ref 15–41)
Albumin: 2.6 g/dL — ABNORMAL LOW (ref 3.5–5.0)
Alkaline Phosphatase: 73 U/L (ref 38–126)
Anion gap: 10 (ref 5–15)
BUN: 8 mg/dL (ref 6–20)
CO2: 23 mmol/L (ref 22–32)
Calcium: 8.3 mg/dL — ABNORMAL LOW (ref 8.9–10.3)
Chloride: 106 mmol/L (ref 98–111)
Creatinine, Ser: 0.6 mg/dL (ref 0.44–1.00)
GFR calc Af Amer: >60 mL/min (ref >60)
GFR calc non Af Amer: >60 mL/min (ref >60)
Glucose, Bld: 183 mg/dL — ABNORMAL HIGH (ref 70–99)
Potassium: 3.4 mmol/L — ABNORMAL LOW (ref 3.5–5.1)
Sodium: 139 mmol/L (ref 135–145)
Total Bilirubin: 0.6 mg/dL (ref 0.3–1.2)
Total Protein: 6.7 g/dL (ref 6.5–8.1)

## 2020-05-04 LAB — CBC
HCT: 29.5 % — ABNORMAL LOW (ref 36.0–46.0)
Hemoglobin: 8.8 g/dL — ABNORMAL LOW (ref 12.0–15.0)
MCH: 24.2 pg — ABNORMAL LOW (ref 26.0–34.0)
MCHC: 29.8 g/dL — ABNORMAL LOW (ref 30.0–36.0)
MCV: 81.3 fL (ref 80.0–100.0)
Platelets: 340 10*3/uL (ref 150–400)
RBC: 3.63 MIL/uL — ABNORMAL LOW (ref 3.87–5.11)
RDW: 15.6 % — ABNORMAL HIGH (ref 11.5–15.5)
WBC: 16.4 10*3/uL — ABNORMAL HIGH (ref 4.0–10.5)
nRBC: 0 % (ref 0.0–0.2)

## 2020-05-04 LAB — GLUCOSE, CAPILLARY
Glucose-Capillary: 121 mg/dL — ABNORMAL HIGH (ref 70–99)
Glucose-Capillary: 133 mg/dL — ABNORMAL HIGH (ref 70–99)
Glucose-Capillary: 150 mg/dL — ABNORMAL HIGH (ref 70–99)
Glucose-Capillary: 174 mg/dL — ABNORMAL HIGH (ref 70–99)
Glucose-Capillary: 189 mg/dL — ABNORMAL HIGH (ref 70–99)
Glucose-Capillary: 243 mg/dL — ABNORMAL HIGH (ref 70–99)
Glucose-Capillary: 256 mg/dL — ABNORMAL HIGH (ref 70–99)

## 2020-05-04 LAB — D-DIMER, QUANTITATIVE: D-Dimer, Quant: 0.88 ug/mL-FEU — ABNORMAL HIGH (ref 0.00–0.50)

## 2020-05-04 LAB — C-REACTIVE PROTEIN: CRP: 20.8 mg/dL — ABNORMAL HIGH (ref <1.0)

## 2020-05-04 LAB — SURGICAL PATHOLOGY

## 2020-05-04 MED ORDER — AMLODIPINE BESYLATE 5 MG PO TABS
5.0000 mg | ORAL_TABLET | Freq: Every day | ORAL | Status: DC
  Administered 2020-05-04 – 2020-05-06 (×3): 5 mg via ORAL
  Filled 2020-05-04 (×3): qty 1

## 2020-05-04 MED ORDER — GLUCERNA SHAKE PO LIQD
237.0000 mL | Freq: Two times a day (BID) | ORAL | Status: DC
  Administered 2020-05-05 – 2020-05-12 (×15): 237 mL via ORAL
  Filled 2020-05-04 (×16): qty 237

## 2020-05-04 MED ORDER — LIVING WELL WITH DIABETES BOOK
Freq: Once | Status: AC
  Administered 2020-05-04: 1
  Filled 2020-05-04: qty 1

## 2020-05-04 MED ORDER — INSULIN ASPART 100 UNIT/ML ~~LOC~~ SOLN
10.0000 [IU] | Freq: Three times a day (TID) | SUBCUTANEOUS | Status: DC
  Administered 2020-05-05 – 2020-05-12 (×23): 10 [IU] via SUBCUTANEOUS

## 2020-05-04 MED ORDER — HYDROCHLOROTHIAZIDE 25 MG PO TABS
25.0000 mg | ORAL_TABLET | Freq: Every day | ORAL | Status: DC
  Administered 2020-05-04 – 2020-05-07 (×4): 25 mg via ORAL
  Filled 2020-05-04 (×4): qty 1

## 2020-05-04 NOTE — Plan of Care (Signed)
  Problem: Nutrition: Goal: Adequate nutrition will be maintained Outcome: Progressing   Problem: Elimination: Goal: Will not experience complications related to bowel motility Outcome: Progressing   Problem: Pain Managment: Goal: General experience of comfort will improve Outcome: Progressing   Problem: Safety: Goal: Ability to remain free from injury will improve Outcome: Progressing   

## 2020-05-04 NOTE — Progress Notes (Addendum)
PROGRESS NOTE    Carolyn Zimmerman  ZOX:[id number] DOB: 04-20-1986 DOA: 05/02/2020 PCP: System, Pcp Not In   Brief Narrative:  Carolyn Zimmerman is a 34 y.o. female past medical history of morbid obesity diabetes mellitus 9.4 and hypertension who comes into the ED for fever nausea diarrhea body aches and abdominal pain that started the day prior to admission she relates her pain in the abdomen is more in the right lower quadrant with a palpable mass tender to touch.  She denies any cuts or sores no vaginal bleeding. She was found to be mildly tachycardic blood pressure stable saturations greater than 95%, white blood cell count of 30,000 with a left shift, lactic acid 1.9 hyponatremic with a blood glucose of 300, UA appears to be infected blood cultures were drawn she was started empirically on IV vanc cefepime and clindamycin.  Assessment & Plan:   Principal Problem:   Panniuclitis with abdominal wall abscess s/p panniculectomy 05/02/2020 Active Problems:   Chest wall abscesses x 2 s/p I&D 05/02/2020   Hyperglycemia   Hx of medication noncompliance   Uncontrolled hypertension   Type 2 diabetes mellitus with hyperglycemia (HCC)   Obesity, Class III, BMI 40-49.9 (morbid obesity) (HCC)   COVID-19 virus infection   Necrotizing fasciitis (HCC)   Sepsis (HCC)   Hidradenitis suppurativa of axillae,chest wall, groins    Sepsis due to necrotizing fasciitis with Abdominal wall abscess, POA: - S/P I/D excision of abdominal wall abscess and paniculutis 05/02/20 -Leukocytosis downtrending, afebrile since admit - Surgery consulted and following, defer to surgery for ongoing wound care evaluation, pending wound VAC per previous documentation. - Continue cefepime, Flagyl.  Recently dc meropenem on 05/03/20; vancomycin dc on 05/04/20. - Continue to follow cultures - preliminarily showing group C strep  Hyperglycemia with uncontrolled diabetes mellitus type 2, non insulin dependent, POA: -Continue diabetic  diet - Levemir 20 BID ongoing - continue to titrate - add 10u pre-meal Lab Results  Component Value Date   HGBA1C 10.4 (H) 05/02/2020   Incidental COVID-19 viral infection, POA: - She denies any cough shortness of breath, x-ray shows no infiltrates, CT scan cut the bases of the lung does not show any infiltrates. - Sure at this time due to her severe infection her inflammatory markers will be significantly elevated due to her necrotizing fasciitis. At this point in time I will not start IV remdesivir steroids. - We will continue to monitor. Recent Labs    05/02/20 0540 05/04/20 0409  DDIMER  --  0.88*  CRP 22.7* 20.8*   Lab Results  Component Value Date   SARSCOV2NAA POSITIVE (A) 05/02/2020   SARSCOV2NAA NEGATIVE 03/25/2020   SARSCOV2NAA NEGATIVE 10/17/2019   SARSCOV2NAA NOT DETECTED 05/30/2019   Microcytic anemia, likely iron deficient/blood loss in the setting of menstruation: -Post op labs stable - continue to follow Lab Results  Component Value Date   WBC 16.4 (H) 05/04/2020   HGB 8.8 (L) 05/04/2020   HCT 29.5 (L) 05/04/2020   MCV 81.3 05/04/2020   PLT 340 05/04/2020   Obesity, Class III, BMI 40-49.9 (morbid obesity) (HCC) Counseling  Candidal intertrigo, POA: Continue on IV Diflucan.   DVT Prophylaxis: lovenox Code Status: full  Family Communication: None  Status is: Inpatient  Dispo: The patient is from: Home              Anticipated d/c is to: Home              Anticipated d/c date is: Pending clinical course,  72 to 96 hours              Patient currently not medically stable for discharge due to ongoing need for IV antibiotics, close monitoring postsurgical status  Consultants:   General surgery  Procedures:  PANNICULECTOMY WITH EXCISION OF ABDOMINAL WALL ABSCESSES & PANNICULITIS INCISION AND DEBRIDEMENTOFCHEST WALL ABSCESS X 2 INCISION & DEBRIDEMENT OFMONS PUBIS ABSCESS X1  Antimicrobials:  Meropenem 05/02/2020-05/03/2020 Vancomycin  05/02/2020-05/04/20 Cefepime 05/02/2020-ongoing Flagyl 05/02/2020-ongoing  Subjective: No acute issues or events overnight, patient tolerated bandage change well this morning with only minimal pain, otherwise denies nausea, vomiting, diarrhea, constipation, headache, fevers, chills.  Objective: Vitals:   05/03/20 2356 05/04/20 0039 05/04/20 0336 05/04/20 0651  BP: (!) 184/101 (!) 141/68 136/76 (!) 152/83  Pulse:   86 86  Resp: 19 20 18 20   Temp:   98.3 F (36.8 C) 98.4 F (36.9 C)  TempSrc:      SpO2:   100% 96%  Weight:      Height:        Intake/Output Summary (Last 24 hours) at 05/04/2020 0754 Last data filed at 05/03/2020 2021 Gross per 24 hour  Intake 1817.03 ml  Output 1425 ml  Net 392.03 ml   Filed Weights   05/02/20 0235 05/02/20 1645  Weight: 128.8 kg 128.5 kg    Examination:  General exam: Appears calm and comfortable  Respiratory system: Clear to auscultation. Respiratory effort normal. Cardiovascular system: S1 & S2 heard, RRR. No JVD, murmurs, rubs, gallops or clicks. No pedal edema. Gastrointestinal system: Abdominal bandage clean dry intact, hypoactive bowel sounds, minimal tenderness at the upper left and right quadrants; moderate tenderness lower abdomen Central nervous system: Alert and oriented. No focal neurological deficits. Extremities: Symmetric 5 x 5 power. Skin: Abdominal bandage clean dry intact.  Data Reviewed: I have personally reviewed following labs and imaging studies  CBC: Recent Labs  Lab 05/02/20 0306 05/02/20 1711 05/03/20 0310 05/04/20 0409  WBC 30.7* 41.5* 32.5* 16.4*  NEUTROABS 25.5*  --   --   --   HGB 11.0* 8.8* 10.1* 8.8*  HCT 35.1* 28.5* 34.0* 29.5*  MCV 77.8* 79.6* 81.3 81.3  PLT 345 365 350 340   Basic Metabolic Panel: Recent Labs  Lab 05/02/20 0306 05/02/20 1711 05/03/20 0310 05/04/20 0409  NA 129*  --  140 139  K 3.6  --  3.3* 3.4*  CL 100  --  106 106  CO2 18*  --  24 23  GLUCOSE 342*  --  107* 183*  BUN  5*  --  8 8  CREATININE 0.79 0.63 0.70 0.60  CALCIUM 8.3*  --  8.1* 8.3*  MG  --  1.8 1.8  --   PHOS  --   --  2.8  --    GFR: Estimated Creatinine Clearance: 142.2 mL/min (by C-G formula based on SCr of 0.6 mg/dL). Liver Function Tests: Recent Labs  Lab 05/02/20 0306 05/04/20 0409  AST 19 9*  ALT 16 12  ALKPHOS 82 73  BILITOT 0.6 0.6  PROT 7.6 6.7  ALBUMIN 3.1* 2.6*   No results for input(s): LIPASE, AMYLASE in the last 168 hours. No results for input(s): AMMONIA in the last 168 hours. Coagulation Profile: No results for input(s): INR, PROTIME in the last 168 hours. Cardiac Enzymes: No results for input(s): CKTOTAL, CKMB, CKMBINDEX, TROPONINI in the last 168 hours. BNP (last 3 results) No results for input(s): PROBNP in the last 8760 hours. HbA1C: Recent Labs  05/02/20 0543 05/02/20 1711  HGBA1C 10.5* 10.4*   CBG: Recent Labs  Lab 05/03/20 1126 05/03/20 1609 05/03/20 2100 05/04/20 0011 05/04/20 0337  GLUCAP 171* 183* 189* 174* 189*   Lipid Profile: No results for input(s): CHOL, HDL, LDLCALC, TRIG, CHOLHDL, LDLDIRECT in the last 72 hours. Thyroid Function Tests: No results for input(s): TSH, T4TOTAL, FREET4, T3FREE, THYROIDAB in the last 72 hours. Anemia Panel: No results for input(s): VITAMINB12, FOLATE, FERRITIN, TIBC, IRON, RETICCTPCT in the last 72 hours. Sepsis Labs: Recent Labs  Lab 05/02/20 0306  LATICACIDVEN 1.9    Recent Results (from the past 240 hour(s))  SARS Coronavirus 2 by RT PCR (hospital order, performed in [facility] hospital lab) Nasopharyngeal Nasopharyngeal Swab     Status: Abnormal   Collection Time: 05/02/20  2:58 AM   Specimen: Nasopharyngeal Swab  Result Value Ref Range Status   SARS Coronavirus 2 POSITIVE (A) NEGATIVE Final    Comment: RESULT CALLED TO, READ BACK BY AND VERIFIED WITH: MAYNARD,C AT 0407 ON [id number] BY CHERESNOWSKY,T (NOTE) SARS-CoV-2 target nucleic acids are DETECTED  SARS-CoV-2 RNA is generally  detectable in upper respiratory specimens  during the acute phase of infection.  Positive results are indicative  of the presence of the identified virus, but do not rule out bacterial infection or co-infection with other pathogens not detected by the test.  Clinical correlation with patient history and  other diagnostic information is necessary to determine patient infection status.  The expected result is negative.  Fact Sheet for Patients:   BoilerBrush.com.cy   Fact Sheet for Healthcare Providers:   https://pope.com/    This test is not yet approved or cleared by the [place] FDA and  has been authorized for detection and/or diagnosis of SARS-CoV-2 by FDA under an Emergency Use Authorization (EUA).  This EUA will remain in effect (mean ing this test can be used) for the duration of  the COVID-19 declaration under Section 564(b)(1) of the Act, 21 U.S.C. section 360-bbb-3(b)(1), unless the authorization is terminated or revoked sooner.  Performed at [facility], [address] [place], [place] [address]   Culture, blood (routine x 2)     Status: None (Preliminary result)   Collection Time: 05/02/20  5:30 AM   Specimen: BLOOD LEFT HAND  Result Value Ref Range Status   Specimen Description   Final    BLOOD LEFT HAND Performed at [facility], [address] [place], [place] [address]    Special Requests   Final    BOTTLES DRAWN AEROBIC AND ANAEROBIC Blood Culture adequate volume Performed at [facility], [address] [place], [place] [address]    Culture   Final    NO GROWTH 2 DAYS Performed at [facility], [address], [place], [place] [address]    Report Status PENDING  Incomplete  Culture, blood (routine x 2)     Status: None (Preliminary result)   Collection Time: 05/02/20  5:40 AM   Specimen: BLOOD LEFT WRIST  Result Value Ref Range Status   Specimen Description    Final    BLOOD LEFT WRIST Performed at [facility], [address] [place], [place] [address]    Special Requests   Final    BOTTLES DRAWN AEROBIC AND ANAEROBIC Blood Culture adequate volume Performed at [facility], [address] [place], [place] [address]    Culture   Final  NO GROWTH 2 DAYS Performed at [facility], [address], [place], [place] [address]    Report Status PENDING  Incomplete  Aerobic/Anaerobic Culture (surgical/deep wound)     Status: None (Preliminary result)   Collection Time: 05/02/20  1:16 PM   Specimen: Wound  Result Value Ref Range Status   Specimen Description   Final    WOUND Performed at [facility], [address], [place], [place] [address]    Special Requests   Final    NONE Performed at [facility], [address], [place], [place] [address]    Gram Stain   Final    NO WBC SEEN MODERATE GRAM NEGATIVE RODS FEW GRAM POSITIVE COCCI RARE GRAM POSITIVE RODS    Culture   Final    CULTURE REINCUBATED FOR BETTER GROWTH Performed at [facility], [address], [place], [place] [address]    Report Status PENDING  Incomplete  Culture, blood (routine x 2)     Status: None (Preliminary result)   Collection Time: 05/02/20  5:11 PM   Specimen: BLOOD  Result Value Ref Range Status   Specimen Description   Final    BLOOD Performed at [facility], [address] [place], [place] [address]    Special Requests   Final    BOTTLES DRAWN AEROBIC AND ANAEROBIC Blood Culture adequate volume Performed at [facility], [address], [place], [place] [address]    Culture   Final    NO GROWTH 2 DAYS Performed at [facility], [address] [place], [place] [address]    Report Status PENDING  Incomplete  Culture, blood (routine x 2)     Status: None (Preliminary result)   Collection Time: 05/02/20  5:31 PM   Specimen: BLOOD  RIGHT HAND  Result Value Ref Range Status   Specimen Description   Final    BLOOD RIGHT HAND Performed at [facility], [address], [place], [place] [address]    Special Requests   Final    BOTTLES DRAWN AEROBIC AND ANAEROBIC Blood Culture adequate volume Performed at [facility], [address], [place], [place] [address]    Culture   Final    NO GROWTH 2 DAYS Performed at [facility], [address], [place], [place] [address]    Report Status PENDING  Incomplete     Radiology Studies: No results found.  Scheduled Meds: . acetaminophen  1,000 mg Oral TID  . Chlorhexidine Gluconate Cloth  6 each Topical Daily  . enoxaparin (LOVENOX) injection  40 mg Subcutaneous Q24H  . feeding supplement (GLUCERNA SHAKE)  237 mL Oral BID BM  . gabapentin  400 mg Oral TID  . insulin aspart  0-20 Units Subcutaneous Q4H  . insulin detemir  20 Units Subcutaneous BID  . lip balm  1 application Topical BID  . mouth rinse  15 mL Mouth Rinse BID  . potassium chloride  40 mEq Oral Daily  . psyllium  1 packet Oral BID   Continuous Infusions: . sodium chloride 10 mL/hr at 05/02/20 0603  . sodium chloride Stopped (05/02/20 1000)  . ceFEPime (MAXIPIME) IV 2 g (05/04/20 0602)  . lactated ringers    . metronidazole 500 mg (05/04/20 0242)  . vancomycin 1,250 mg (05/04/20 0039)  . vancomycin       LOS: 2 days   Time spent:  Chrissie Noa  Patrici Ranks, DO [facility]  If 7PM-7AM, please contact night-coverage [email or web address]  05/04/2020, 7:54 AM

## 2020-05-04 NOTE — Progress Notes (Addendum)
Inpatient Diabetes Program Recommendations  AACE/ADA: New Consensus Statement on Inpatient Glycemic Control (2015)  Target Ranges:  Prepandial:   less than 140 mg/dL      Peak postprandial:   less than 180 mg/dL (1-2 hours)      Critically ill patients:  140 - 180 mg/dL   Lab Results  Component Value Date   GLUCAP 256 (H) 05/04/2020   HGBA1C 10.4 (H) 05/02/2020    Review of Glycemic Control  Diabetes history: DM2 Outpatient Diabetes medications: metformin 500 mg tid, 70/30 25 units bid (not taking) Current orders for Inpatient glycemic control: Levemir 20 units bid, Novolog 0-20 units Q4H  HgbA1C - 10.4%.  Post-prandials elevated  Inpatient Diabetes Program Recommendations:     Add Novolog 10 units tidwc for meal coverage insulin Add CHO mod med to heart healthy diet  Will speak with pt about HgbA1C, diet, making good choices, and exercising on a regular basis.   Secure text to MD regarding addition of meal coverage.  Thank you. Ailene Ards, RD, LDN, CDE Inpatient Diabetes Coordinator [phone]  Addendum: Spoke with pt on phone regarding her HgbA1C of 10.4% and how food, exercise, stress management all play a role in good glycemic control. Discussed monitoring blood sugars at least 3-4x/day and taking logbook to PCP for review. Pt states she would like a different PCP when discharged. Discussed healthy meal options with breakfast and eliminating all high-sugar foods and beverages. Pt states she is now motivated to control her diabetes, lose weight and exercise regularly. Gave encouragement and will speak with her again prior to d/c.  Discussed with RN.

## 2020-05-04 NOTE — Progress Notes (Signed)
Pt remains on room air with o2 sats >95%. Dyspnea on exertion. Instructed and encouraged to use incentive spirometer. Refused to get in chair this morning, stated he will try later today.

## 2020-05-04 NOTE — Progress Notes (Signed)
2 Days Post-Op    LN:[id number] pain and fever  Subjective: Pt up in chair with some effort, we got her back in bed and did her dressing changes.  She was really tearful, it hurt a great deal yesterday.    Objective: Vital signs in last 24 hours: Temp:  [98.2 F (36.8 C)-98.9 F (37.2 C)] 98.4 F (36.9 C) (08/17 0651) Pulse Rate:  [83-102] 86 (08/17 0651) Resp:  [15-34] 20 (08/17 0651) BP: (98-199)/(58-114) 152/83 (08/17 0651) SpO2:  [96 %-100 %] 96 % (08/17 0651) Last BM Date:  (PTA ) 1010 p.o. 800 IV Urine 1425 No BM recorded Afebrile Respiratory rate 14-27 BP 98/58-170/92 Sats 96 to 100% on room air  Glucose 180 range K+ 3.4 Remainder the CMP is stable. CRP 20.8 WBC 30.7>> 41.5>> 32.5>> 16.5(8/17) D-dimer 0.88 Hemoglobin A1c 10.4  Blood cultures no growth x2 days Wound culture Gram Stain NO WBC SEEN  MODERATE GRAM NEGATIVE RODS    Intake/Output from previous day: 08/16 0701 - 08/17 0700 In: 1817 [P.O.:1010; I.V.:27.2; IV Piggyback:779.9] Out: 1425 [Urine:1425] Intake/Output this shift: No intake/output data recorded.  General appearance: alert, cooperative, no distress and anxious  Skin: all the sites are clean and look like the panus.  Minimal cellulitis present.    Lab Results:  Recent Labs    05/03/20 0310 05/04/20 0409  WBC 32.5* 16.4*  HGB 10.1* 8.8*  HCT 34.0* 29.5*  PLT 350 340    BMET Recent Labs    05/03/20 0310 05/04/20 0409  NA 140 139  K 3.3* 3.4*  CL 106 106  CO2 24 23  GLUCOSE 107* 183*  BUN 8 8  CREATININE 0.70 0.60  CALCIUM 8.1* 8.3*   PT/INR No results for input(s): LABPROT, INR in the last 72 hours.  Recent Labs  Lab 05/02/20 0306 05/04/20 0409  AST 19 9*  ALT 16 12  ALKPHOS 82 73  BILITOT 0.6 0.6  PROT 7.6 6.7  ALBUMIN 3.1* 2.6*     Lipase     Component Value Date/Time   LIPASE 28 03/25/2020 0746     Medications:  acetaminophen  1,000 mg Oral TID   Chlorhexidine Gluconate Cloth  6 each Topical  Daily   enoxaparin (LOVENOX) injection  40 mg Subcutaneous Q24H   feeding supplement (GLUCERNA SHAKE)  237 mL Oral BID BM   gabapentin  400 mg Oral TID   insulin aspart  0-20 Units Subcutaneous Q4H   insulin detemir  20 Units Subcutaneous BID   lip balm  1 application Topical BID   mouth rinse  15 mL Mouth Rinse BID   potassium chloride  40 mEq Oral Daily   psyllium  1 packet Oral BID    sodium chloride 10 mL/hr at 05/02/20 0603   sodium chloride Stopped (05/02/20 1000)   ceFEPime (MAXIPIME) IV 2 g (05/04/20 0602)   lactated ringers     metronidazole 500 mg (05/04/20 0242)   vancomycin 1,250 mg (05/04/20 0039)   vancomycin      Assessment/Plan Covid positive Sepsis due to necrotizing fasciitis/abdominal wall abscess  - WBC 30.7>> 41.5>> 32.5>> 16.5(8/17) Hyperglycemia with uncontrolled type 2 diabetes Uncontrolled hypertension Microcytic anemia Morbid obesity BMI 44.36 Candidal intertrigo, POA Hx noncompliance   Panniculitis with abdominal wall abscess, chest wall abscess x2, left mons pubis/groin abscess, hidradenitis suppurativa Panniculectomy with excision of abdominal wall abscess and panniculitis, incision and debridement of chest wall abscess x2, incision and debridement of mons pubis abscess x1,  05/02/20, Dr. Viviann Spare  Gross POD #2  FEN: IVF KVO/heart healthy diet ID: ID: Clindamycin times 05/02/20; Diflucan times 18/15/21; meropenem times 05/02/20;  Flagyl 8/15>> day 3; vancomycin 8/15>> day 3 DVT: Lovenox Follow-up: Dr. Karie Soda   Plan:  We did the dressing change and she did really well.  Continue BId dressing changes, hopefully we will be able to get her into the shower, possible wound vac candidate also.      LOS: 2 days    Carolyn Zimmerman 05/04/2020 Please see Amion

## 2020-05-04 NOTE — Consult Note (Addendum)
WOC Nurse Consult Note: Reason for Consult:Application of VAC dressing to panniculectomy site.   No showering unless VAC change day. Will try and coordinate for Thursday.   Wound type:surgical, infectious Pressure Injury POA: NA Measurement: 6 cm x  22 cm x 9 cm  Wound KGM:WNUUV red Drainage (amount, consistency, odor) minimal serosanguinous  No odor.  Periwound:intact Dressing procedure/placement/frequency:premedicated for pain. 4 pieces black foam.  Drape applied and seal immediately achieved.  Will follow.   Maple Hudson MSN, RN, FNP-BC CWON Wound, Ostomy, Continence Nurse Pager [phone]

## 2020-05-05 DIAGNOSIS — L02213 Cutaneous abscess of chest wall: Secondary | ICD-10-CM

## 2020-05-05 DIAGNOSIS — L732 Hidradenitis suppurativa: Secondary | ICD-10-CM

## 2020-05-05 LAB — CBC
HCT: 32.4 % — ABNORMAL LOW (ref 36.0–46.0)
Hemoglobin: 10 g/dL — ABNORMAL LOW (ref 12.0–15.0)
MCH: 24.4 pg — ABNORMAL LOW (ref 26.0–34.0)
MCHC: 30.9 g/dL (ref 30.0–36.0)
MCV: 79.2 fL — ABNORMAL LOW (ref 80.0–100.0)
Platelets: 416 10*3/uL — ABNORMAL HIGH (ref 150–400)
RBC: 4.09 MIL/uL (ref 3.87–5.11)
RDW: 15.7 % — ABNORMAL HIGH (ref 11.5–15.5)
WBC: 13.4 10*3/uL — ABNORMAL HIGH (ref 4.0–10.5)
nRBC: 0 % (ref 0.0–0.2)

## 2020-05-05 LAB — COMPREHENSIVE METABOLIC PANEL
ALT: 12 U/L (ref 0–44)
AST: 11 U/L — ABNORMAL LOW (ref 15–41)
Albumin: 2.7 g/dL — ABNORMAL LOW (ref 3.5–5.0)
Alkaline Phosphatase: 87 U/L (ref 38–126)
Anion gap: 9 (ref 5–15)
BUN: 8 mg/dL (ref 6–20)
CO2: 23 mmol/L (ref 22–32)
Calcium: 9 mg/dL (ref 8.9–10.3)
Chloride: 103 mmol/L (ref 98–111)
Creatinine, Ser: 0.56 mg/dL (ref 0.44–1.00)
GFR calc Af Amer: >60 mL/min (ref >60)
GFR calc non Af Amer: >60 mL/min (ref >60)
Glucose, Bld: 213 mg/dL — ABNORMAL HIGH (ref 70–99)
Potassium: 3.5 mmol/L (ref 3.5–5.1)
Sodium: 135 mmol/L (ref 135–145)
Total Bilirubin: 0.4 mg/dL (ref 0.3–1.2)
Total Protein: 7.6 g/dL (ref 6.5–8.1)

## 2020-05-05 LAB — GLUCOSE, CAPILLARY
Glucose-Capillary: 132 mg/dL — ABNORMAL HIGH (ref 70–99)
Glucose-Capillary: 157 mg/dL — ABNORMAL HIGH (ref 70–99)
Glucose-Capillary: 176 mg/dL — ABNORMAL HIGH (ref 70–99)
Glucose-Capillary: 185 mg/dL — ABNORMAL HIGH (ref 70–99)
Glucose-Capillary: 200 mg/dL — ABNORMAL HIGH (ref 70–99)
Glucose-Capillary: 207 mg/dL — ABNORMAL HIGH (ref 70–99)

## 2020-05-05 LAB — D-DIMER, QUANTITATIVE: D-Dimer, Quant: 0.89 ug/mL-FEU — ABNORMAL HIGH (ref 0.00–0.50)

## 2020-05-05 LAB — C-REACTIVE PROTEIN: CRP: 13.6 mg/dL — ABNORMAL HIGH (ref <1.0)

## 2020-05-05 MED ORDER — TRAMADOL HCL 50 MG PO TABS
50.0000 mg | ORAL_TABLET | Freq: Two times a day (BID) | ORAL | Status: AC | PRN
  Administered 2020-05-05 (×2): 50 mg via ORAL
  Filled 2020-05-05 (×2): qty 1

## 2020-05-05 MED ORDER — SODIUM CHLORIDE 0.9 % IV SOLN
1200.0000 mg | Freq: Once | INTRAVENOUS | Status: AC
  Administered 2020-05-05: 1200 mg via INTRAVENOUS
  Filled 2020-05-05: qty 720

## 2020-05-05 MED ORDER — LABETALOL HCL 5 MG/ML IV SOLN
10.0000 mg | Freq: Once | INTRAVENOUS | Status: AC
  Administered 2020-05-05: 10 mg via INTRAVENOUS

## 2020-05-05 MED ORDER — SODIUM CHLORIDE 0.9 % IV SOLN: INTRAVENOUS | Status: DC | PRN

## 2020-05-05 MED ORDER — DIPHENHYDRAMINE HCL 50 MG/ML IJ SOLN: 50.0000 mg | Freq: Once | INTRAMUSCULAR | Status: DC | PRN

## 2020-05-05 MED ORDER — ALBUTEROL SULFATE HFA 108 (90 BASE) MCG/ACT IN AERS: 2.0000 | INHALATION_SPRAY | Freq: Once | RESPIRATORY_TRACT | Status: DC | PRN

## 2020-05-05 MED ORDER — LABETALOL HCL 5 MG/ML IV SOLN
10.0000 mg | Freq: Once | INTRAVENOUS | Status: AC
  Administered 2020-05-05: 10 mg via INTRAVENOUS
  Filled 2020-05-05: qty 4

## 2020-05-05 MED ORDER — FAMOTIDINE IN NACL 20-0.9 MG/50ML-% IV SOLN: 20.0000 mg | Freq: Once | INTRAVENOUS | Status: DC | PRN

## 2020-05-05 MED ORDER — EPINEPHRINE 0.3 MG/0.3ML IJ SOAJ
0.3000 mg | Freq: Once | INTRAMUSCULAR | Status: DC | PRN
  Filled 2020-05-05: qty 0.6

## 2020-05-05 MED ORDER — METHYLPREDNISOLONE SODIUM SUCC 125 MG IJ SOLR: 125.0000 mg | Freq: Once | INTRAMUSCULAR | Status: DC | PRN

## 2020-05-05 NOTE — Progress Notes (Signed)
PHARMACY NOTE -  Cefepime  Pharmacy has been assisting with dosing of cefepime for cellulitis.  Dosage remains stable at 2g IV q8 hr and need for further dosage adjustment appears unlikely at present given SCr at baseline  Pharmacy will sign off, following peripherally for culture results or dose adjustments. Please reconsult if a change in clinical status warrants re-evaluation of dosage.  Bernadene Person, PharmD, BCPS [phone] 05/05/2020, 2:40 PM

## 2020-05-05 NOTE — Plan of Care (Signed)
Plan of care discussed.   

## 2020-05-05 NOTE — Progress Notes (Addendum)
PROGRESS NOTE  Carolyn Zimmerman  CNO:[id number] DOB: 07/01/1986 DOA: 05/02/2020 PCP: System, Pcp Not In Brief Narrative: Carolyn Zimmerman is a 34 y.o. female with a history of morbid obesity, T2DM, and HTN who presented to the ED 8/15 with palpable tender abdominal wall found to have panniculitis with abdominal abscess treated with broad spectrum antibiotics and panniculectomy. SARS-CoV-2 screen found to be positive.  Assessment & Plan: Principal Problem:   Panniuclitis with abdominal wall abscess s/p panniculectomy 05/02/2020 Active Problems:   Chest wall abscesses x 2 s/p I&D 05/02/2020   Hyperglycemia   Hx of medication noncompliance   Uncontrolled hypertension   Type 2 diabetes mellitus with hyperglycemia (HCC)   Obesity, Class III, BMI 40-49.9 (morbid obesity) (HCC)   COVID-19 virus infection   Necrotizing fasciitis (HCC)   Sepsis (HCC)   Hidradenitis suppurativa of axillae,chest wall, groins  Sepsis due to necrotizing fasciitis, panniculitis, and abscesses of abdominal wall and chest wall s/p I&D 8/15 by Dr. Michaell Cowing: Sepsis POA, resolved. - Continue cefepime, flagyl. No MRSA noted on culture. Blood cultures NGTD. Culture growing group C beta-hemolytic strep without anaerobes isolated. Continue cefepime with expectation of beta blactam susceptibility. Surgery recommends at least 5 days IV abx.  - Monitor final culture results and leukocytosis. Wound appears to have minimal cellulitis. - Wound vac started 8/17, wound care per surgery.   Covid-19 infection: SARS-CoV-2 PCR positive on 8/15 with no definite attributable symptoms. No hypoxemia or infiltrates. Currently has been found to be incidentally positive for covid without shortness of breath, and would NOT necessitate admission for covid if not already inpatient for sepsis due to panniculitis. Has high risk for progression to severe disease requiring hospitalization (obesity, diabetes poorly controlled).   - Continue isolation for 10 days from  positive test (or 21 days if develops severe symptoms).  - Patient satisfies criteria for regeneron monoclonal antibodies under EUA. Have discussed this with patient extensively, who consents to administration.  Uncontrolled T2DM: HbA1c 10.4%.  - Levemir 20u BID, continue novolog 10u TIDWC + SSI - Needs tight control for wound healing. Dietitian consulted.   HTN:  - Continue norvasc, HCTZ  Iron deficiency anemia: Stable postoperative counts.  - Check iron studies  Candida intertrigo: s/p diflucan   Hypokalemia: Resolved with supplementation.  Morbid obesity: Estimated body mass index is 44.37 kg/m as calculated from the following:   Height as of this encounter: 5\' 7"  (1.702 m).   Weight as of this encounter: 128.5 kg.  DVT prophylaxis: Lovenox Code Status: Full Family Communication: None at bedside Disposition Plan:  Status is: Inpatient  Remains inpatient appropriate because:IV treatments appropriate due to intensity of illness or inability to take PO and Inpatient level of care appropriate due to severity of illness.    Dispo: The patient is from: Home              Anticipated d/c is to: Home              Anticipated d/c date is: 2 days              Patient currently is not medically stable to d/c.  Consultants:   General surgery  Procedures:  05/02/20 IRRIGATION AND DEBRIDEMENT ABDOMINAL and CHEST WALL NECROTIZING 05/04/20, MD   Antimicrobials:  Vancomycin 8/15 >>  Flagyl 8/15 >>  Cefepime 8/16 >>  Meropenem 8/15 x1  Clindamycin 8/15 x1  Zosyn 8/15 x1  Fluconazole x1  Subjective: Pain in abdomen is constant, stable, worse with movement,  though still able to sit up unassisted. Anxious about wound vac at home. No cough, chest pain, dyspnea. She did feel like she was more tired and had some chills on 8/13 treated with theraflu.   Objective: Vitals:   05/04/20 1500 05/04/20 1849 05/04/20 2007 05/05/20 0400  BP:  (!) 182/104 (!) 188/116 (!)  154/100  Pulse:  84 86 95  Resp: 20  20 (!) 24  Temp:   98.1 F (36.7 C) 98.5 F (36.9 C)  TempSrc:    Oral  SpO2: 98% 100% 100% 100%  Weight:      Height:        Intake/Output Summary (Last 24 hours) at 05/05/2020 1318 Last data filed at 05/05/2020 1100 Gross per 24 hour  Intake 1081.58 ml  Output --  Net 1081.58 ml   Filed Weights   05/02/20 0235 05/02/20 1645  Weight: 128.8 kg 128.5 kg    Gen: Pleasant, obese female in no distress Pulm: Non-labored breathing room air. Clear to auscultation bilaterally.  CV: Regular rate and rhythm. No murmur, rub, or gallop. No JVD, no pitting pedal edema. GI: Abdomen soft, nappropriately tender, non-distended, with normoactive bowel sounds. Wound vac without evidence of leak at machine.  Ext: Warm, no deformities Skin: No rashes, lesions or ulcers on visualized skin. Surgery to supervise wound dressing change. Neuro: Alert and oriented. No focal neurological deficits. Psych: Judgement and insight appear normal. Mood & affect appropriate.   Data Reviewed: I have personally reviewed following labs and imaging studies  CBC: Recent Labs  Lab 05/02/20 0306 05/02/20 1711 05/03/20 0310 05/04/20 0409 05/05/20 0711  WBC 30.7* 41.5* 32.5* 16.4* 13.4*  NEUTROABS 25.5*  --   --   --   --   HGB 11.0* 8.8* 10.1* 8.8* 10.0*  HCT 35.1* 28.5* 34.0* 29.5* 32.4*  MCV 77.8* 79.6* 81.3 81.3 79.2*  PLT 345 365 350 340 416*   Basic Metabolic Panel: Recent Labs  Lab 05/02/20 0306 05/02/20 1711 05/03/20 0310 05/04/20 0409 05/05/20 0711  NA 129*  --  140 139 135  K 3.6  --  3.3* 3.4* 3.5  CL 100  --  106 106 103  CO2 18*  --  24 23 23   GLUCOSE 342*  --  107* 183* 213*  BUN 5*  --  8 8 8   CREATININE 0.79 0.63 0.70 0.60 0.56  CALCIUM 8.3*  --  8.1* 8.3* 9.0  MG  --  1.8 1.8  --   --   PHOS  --   --  2.8  --   --    GFR: Estimated Creatinine Clearance: 142.2 mL/min (by C-G formula based on SCr of 0.56 mg/dL). Liver Function Tests: Recent  Labs  Lab 05/02/20 0306 05/04/20 0409 05/05/20 0711  AST 19 9* 11*  ALT 16 12 12   ALKPHOS 82 73 87  BILITOT 0.6 0.6 0.4  PROT 7.6 6.7 7.6  ALBUMIN 3.1* 2.6* 2.7*   No results for input(s): LIPASE, AMYLASE in the last 168 hours. No results for input(s): AMMONIA in the last 168 hours. Coagulation Profile: No results for input(s): INR, PROTIME in the last 168 hours. Cardiac Enzymes: No results for input(s): CKTOTAL, CKMB, CKMBINDEX, TROPONINI in the last 168 hours. BNP (last 3 results) No results for input(s): PROBNP in the last 8760 hours. HbA1C: Recent Labs    05/02/20 1711  HGBA1C 10.4*   CBG: Recent Labs  Lab 05/04/20 2009 05/04/20 2344 05/05/20 0355 05/05/20 0810 05/05/20 1135  GLUCAP  243* 150* 185* 207* 200*   Lipid Profile: No results for input(s): CHOL, HDL, LDLCALC, TRIG, CHOLHDL, LDLDIRECT in the last 72 hours. Thyroid Function Tests: No results for input(s): TSH, T4TOTAL, FREET4, T3FREE, THYROIDAB in the last 72 hours. Anemia Panel: No results for input(s): VITAMINB12, FOLATE, FERRITIN, TIBC, IRON, RETICCTPCT in the last 72 hours. Urine analysis:    Component Value Date/Time   COLORURINE ORANGE (A) 05/02/2020 0422   APPEARANCEUR CLOUDY (A) 05/02/2020 0422   LABSPEC 1.010 05/02/2020 0422   PHURINE 6.5 05/02/2020 0422   GLUCOSEU >=500 (A) 05/02/2020 0422   HGBUR TRACE (A) 05/02/2020 0422   BILIRUBINUR NEGATIVE 05/02/2020 0422   KETONESUR 15 (A) 05/02/2020 0422   PROTEINUR 100 (A) 05/02/2020 0422   UROBILINOGEN 1.0 01/19/2012 0015   NITRITE NEGATIVE 05/02/2020 0422   LEUKOCYTESUR TRACE (A) 05/02/2020 0422   Recent Results (from the past 240 hour(s))  SARS Coronavirus 2 by RT PCR (hospital order, performed in [facility] hospital lab) Nasopharyngeal Nasopharyngeal Swab     Status: Abnormal   Collection Time: 05/02/20  2:58 AM   Specimen: Nasopharyngeal Swab  Result Value Ref Range Status   SARS Coronavirus 2 POSITIVE (A) NEGATIVE Final    Comment:  RESULT CALLED TO, READ BACK BY AND VERIFIED WITH: MAYNARD,C AT 0407 ON [id number] BY CHERESNOWSKY,T (NOTE) SARS-CoV-2 target nucleic acids are DETECTED  SARS-CoV-2 RNA is generally detectable in upper respiratory specimens  during the acute phase of infection.  Positive results are indicative  of the presence of the identified virus, but do not rule out bacterial infection or co-infection with other pathogens not detected by the test.  Clinical correlation with patient history and  other diagnostic information is necessary to determine patient infection status.  The expected result is negative.  Fact Sheet for Patients:   BoilerBrush.com.cy   Fact Sheet for Healthcare Providers:   https://pope.com/    This test is not yet approved or cleared by the [place] FDA and  has been authorized for detection and/or diagnosis of SARS-CoV-2 by FDA under an Emergency Use Authorization (EUA).  This EUA will remain in effect (mean ing this test can be used) for the duration of  the COVID-19 declaration under Section 564(b)(1) of the Act, 21 U.S.C. section 360-bbb-3(b)(1), unless the authorization is terminated or revoked sooner.  Performed at [facility], [address] [place], [place] [address]   Culture, blood (routine x 2)     Status: None (Preliminary result)   Collection Time: 05/02/20  5:30 AM   Specimen: BLOOD LEFT HAND  Result Value Ref Range Status   Specimen Description   Final    BLOOD LEFT HAND Performed at [facility], [address] [place], [place] [address]    Special Requests   Final    BOTTLES DRAWN AEROBIC AND ANAEROBIC Blood Culture adequate volume Performed at [facility], [address] [place], [place] [address]    Culture   Final    NO GROWTH 3 DAYS Performed at [facility], [address] [place], [place] [address]    Report Status PENDING  Incomplete  Culture,  blood (routine x 2)     Status: None (Preliminary result)   Collection Time: 05/02/20  5:40 AM   Specimen: BLOOD LEFT WRIST  Result Value Ref Range Status   Specimen Description   Final    BLOOD LEFT WRIST Performed at [facility], [address] [place], [place] [address]    Special Requests   Final    BOTTLES DRAWN AEROBIC AND ANAEROBIC Blood Culture adequate volume Performed at [facility], [address] [place], [place] [address]    Culture   Final    NO GROWTH 3 DAYS Performed at [facility], [address] [place], [place] [address]    Report Status PENDING  Incomplete  Aerobic/Anaerobic Culture (surgical/deep wound)     Status: Abnormal (Preliminary result)   Collection Time: 05/02/20  1:16 PM   Specimen: Wound  Result Value Ref Range Status   Specimen Description   Final    WOUND Performed at [facility], [address] [place], [place] [address]    Special Requests   Final    NONE Performed at [facility], [address] [place], [place] [address]    Gram Stain   Final    NO WBC SEEN MODERATE GRAM NEGATIVE RODS FEW GRAM POSITIVE COCCI RARE GRAM POSITIVE RODS Performed at [facility], [address], [place], [place] [address]    Culture (A)  Final    STREPTOCOCCUS GROUP C Beta hemolytic streptococci are predictably susceptible to penicillin and other beta lactams. Susceptibility testing not routinely performed. NO ANAEROBES ISOLATED; CULTURE IN PROGRESS FOR 5 DAYS    Report Status PENDING  Incomplete  Culture, blood (routine x 2)     Status: None (Preliminary result)   Collection Time: 05/02/20  5:11 PM   Specimen: BLOOD  Result Value Ref Range Status   Specimen Description   Final    BLOOD Performed at [facility], [address] [place], [place] [address]    Special Requests   Final    BOTTLES DRAWN AEROBIC AND ANAEROBIC Blood Culture adequate  volume Performed at [facility] [address], [place], [place] [address]    Culture   Final    NO GROWTH 3 DAYS Performed at [facility], [address] [place], [place] [address]    Report Status PENDING  Incomplete  Culture, blood (routine x 2)     Status: None (Preliminary result)   Collection Time: 05/02/20  5:31 PM   Specimen: BLOOD RIGHT HAND  Result Value Ref Range Status   Specimen Description   Final    BLOOD RIGHT HAND Performed at [facility], [address], [place], [place] [address]    Special Requests   Final    BOTTLES DRAWN AEROBIC AND ANAEROBIC Blood Culture adequate volume Performed at [facility], [address], [place], [place] [address]    Culture   Final    NO GROWTH 3 DAYS Performed at [facility], [address] [place], [place] [address]    Report Status PENDING  Incomplete      Radiology Studies: No results found.  Scheduled Meds: . acetaminophen  1,000 mg Oral TID  . amLODipine  5 mg Oral Daily  . Chlorhexidine Gluconate Cloth  6 each Topical Daily  . enoxaparin (LOVENOX) injection  40 mg Subcutaneous Q24H  . feeding supplement (GLUCERNA SHAKE)  237 mL Oral BID BM  . gabapentin  400 mg Oral TID  . hydrochlorothiazide  25 mg Oral Daily  . insulin aspart  0-20 Units Subcutaneous Q4H  . insulin aspart  10 Units Subcutaneous TID WC  . insulin detemir  20 Units Subcutaneous BID  .  lip balm  1 application Topical BID  . mouth rinse  15 mL Mouth Rinse BID  . psyllium  1 packet Oral BID   Continuous Infusions: . sodium chloride 10 mL/hr at 05/02/20 0603  . sodium chloride Stopped (05/02/20 1000)  . ceFEPime (MAXIPIME) IV 2 g (05/05/20 0533)  . metronidazole 500 mg (05/05/20 1243)     LOS: 3 days   Time spent: 35 minutes.  Tyrone Nineyan B Gillian Meeuwsen, MD [facility] [email or web address] 05/05/2020, 1:18 PM

## 2020-05-05 NOTE — Progress Notes (Signed)
Pt remains on room air. Pt blood pressure elevated but improved with meds. Voided large amount post HTCZ. Pt tolerated dressing change with prn meds. Ultram ordered and given to patient for pain. Later required prn Dilaudid, smallest dose given. Up in chair since 0400. Tolerating well. Wound vac in place.

## 2020-05-05 NOTE — TOC Progression Note (Addendum)
Transition of Care [facility]) - Progression Note    Patient Details  Name: Carolyn Zimmerman MRN: [id number] Date of Birth: 1986-08-16  Transition of Care [facility]) CM/SW Contact  Ida Rog[place], [place] Phone Number: 05/05/2020, 9:52 AM  Clinical Narrative:   Spoke with patient about plan.  She states her mother, who is Charity fundraiser by trade, will be able to help with wound vac.  She confirms that she has no PCP and will need one, and is interested in [facility] RN help.  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  Appreciate Will from surgery who signed form for [employer], and French Ana with [employer] who is filling out the rest of the form so that machine can be delivered.  Will set patient up with PCP and see if [facility] RN is in the realm of possibility. TOC will continue to follow during the course of hospitalization.  Addendum:  Patient has PCP appointment in mid September.  Cindie at [facility] is reviewing patient for possible [facility] RN services  Addendum II: Appreciate Cindie with [facility] who is willing to work with this patient for [facility] services.  Addendum III: Cindie has contracted with [facility] for [facility] orders.     Expected Discharge Plan: Home w Home Health Services Barriers to Discharge: Continued Medical Work up  Expected Discharge Plan and Services Expected Discharge Plan: Home w Home Health Services   Discharge Planning Services: CM Consult   Living arrangements for the past 2 months: Single Family Home                                       Social Determinants of Health (SDOH) Interventions    Readmission Risk Interventions No flowsheet data found.

## 2020-05-06 LAB — GLUCOSE, CAPILLARY
Glucose-Capillary: 200 mg/dL — ABNORMAL HIGH (ref 70–99)
Glucose-Capillary: 175 mg/dL — ABNORMAL HIGH (ref 70–99)
Glucose-Capillary: 248 mg/dL — ABNORMAL HIGH (ref 70–99)
Glucose-Capillary: 221 mg/dL — ABNORMAL HIGH (ref 70–99)

## 2020-05-06 LAB — CBC
HCT: 32.8 % — ABNORMAL LOW (ref 36.0–46.0)
Hemoglobin: 9.7 g/dL — ABNORMAL LOW (ref 12.0–15.0)
MCH: 24 pg — ABNORMAL LOW (ref 26.0–34.0)
MCHC: 29.6 g/dL — ABNORMAL LOW (ref 30.0–36.0)
MCV: 81.2 fL (ref 80.0–100.0)
Platelets: 393 10*3/uL (ref 150–400)
RBC: 4.04 MIL/uL (ref 3.87–5.11)
RDW: 16 % — ABNORMAL HIGH (ref 11.5–15.5)
WBC: 12.1 10*3/uL — ABNORMAL HIGH (ref 4.0–10.5)
nRBC: 0 % (ref 0.0–0.2)

## 2020-05-06 LAB — IRON AND TIBC
Iron: 42 ug/dL (ref 28–170)
Saturation Ratios: 18 % (ref 10.4–31.8)
TIBC: 233 ug/dL — ABNORMAL LOW (ref 250–450)
UIBC: 191 ug/dL

## 2020-05-06 LAB — FERRITIN: Ferritin: 109 ng/mL (ref 11–307)

## 2020-05-06 MED ORDER — LABETALOL HCL 5 MG/ML IV SOLN
10.0000 mg | Freq: Once | INTRAVENOUS | Status: AC
  Administered 2020-05-06: 10 mg via INTRAVENOUS

## 2020-05-06 MED ORDER — DIPHENHYDRAMINE HCL 12.5 MG/5ML PO ELIX
12.5000 mg | ORAL_SOLUTION | Freq: Four times a day (QID) | ORAL | Status: DC | PRN
  Administered 2020-05-06: 12.5 mg via ORAL
  Filled 2020-05-06: qty 10

## 2020-05-06 MED ORDER — SACCHAROMYCES BOULARDII 250 MG PO CAPS
250.0000 mg | ORAL_CAPSULE | Freq: Two times a day (BID) | ORAL | Status: DC
  Administered 2020-05-06 – 2020-05-12 (×12): 250 mg via ORAL
  Filled 2020-05-06 (×13): qty 1

## 2020-05-06 MED ORDER — INSULIN DETEMIR 100 UNIT/ML ~~LOC~~ SOLN
25.0000 [IU] | Freq: Two times a day (BID) | SUBCUTANEOUS | Status: DC
  Administered 2020-05-06 – 2020-05-08 (×3): 25 [IU] via SUBCUTANEOUS
  Filled 2020-05-06 (×4): qty 0.25

## 2020-05-06 MED ORDER — LABETALOL HCL 5 MG/ML IV SOLN: 10.0000 mg | INTRAVENOUS | Status: DC | PRN

## 2020-05-06 MED ORDER — INSULIN ASPART 100 UNIT/ML ~~LOC~~ SOLN
0.0000 [IU] | Freq: Three times a day (TID) | SUBCUTANEOUS | Status: DC
  Administered 2020-05-07: 3 [IU] via SUBCUTANEOUS
  Administered 2020-05-07: 15 [IU] via SUBCUTANEOUS
  Administered 2020-05-07 – 2020-05-08 (×2): 11 [IU] via SUBCUTANEOUS
  Administered 2020-05-08: 15 [IU] via SUBCUTANEOUS
  Administered 2020-05-08: 4 [IU] via SUBCUTANEOUS
  Administered 2020-05-09: 7 [IU] via SUBCUTANEOUS
  Administered 2020-05-09: 11 [IU] via SUBCUTANEOUS
  Administered 2020-05-09 – 2020-05-10 (×2): 7 [IU] via SUBCUTANEOUS
  Administered 2020-05-10: 11 [IU] via SUBCUTANEOUS
  Administered 2020-05-10 – 2020-05-11 (×2): 7 [IU] via SUBCUTANEOUS
  Administered 2020-05-11: 11 [IU] via SUBCUTANEOUS
  Administered 2020-05-11: 7 [IU] via SUBCUTANEOUS
  Administered 2020-05-12: 11 [IU] via SUBCUTANEOUS
  Administered 2020-05-12: 15 [IU] via SUBCUTANEOUS

## 2020-05-06 MED ORDER — AMLODIPINE BESYLATE 10 MG PO TABS
10.0000 mg | ORAL_TABLET | Freq: Every day | ORAL | Status: DC
  Administered 2020-05-07 – 2020-05-12 (×6): 10 mg via ORAL
  Filled 2020-05-06 (×6): qty 1

## 2020-05-06 MED ORDER — AMLODIPINE BESYLATE 5 MG PO TABS
5.0000 mg | ORAL_TABLET | Freq: Once | ORAL | Status: AC
  Administered 2020-05-06: 5 mg via ORAL
  Filled 2020-05-06: qty 1

## 2020-05-06 NOTE — Progress Notes (Signed)
PROGRESS NOTE  Nevelyn Mellott  ZOX:[id number] DOB: Apr 21, 1986 DOA: 05/02/2020 PCP: System, Pcp Not In Brief Narrative: Carolyn Zimmerman is a 34 y.o. female with a history of morbid obesity, T2DM, and HTN who presented to the ED 8/15 with palpable tender abdominal wall found to have panniculitis with abdominal abscess treated with broad spectrum antibiotics and panniculectomy. SARS-CoV-2 screen found to be positive, and the patient received regeneron monoclonal antibody. Wound care with wound vac is ongoing.  Assessment & Plan: Principal Problem:   Panniuclitis with abdominal wall abscess s/p panniculectomy 05/02/2020 Active Problems:   Chest wall abscesses x 2 s/p I&D 05/02/2020   Hyperglycemia   Hx of medication noncompliance   Uncontrolled hypertension   Type 2 diabetes mellitus with hyperglycemia (HCC)   Obesity, Class III, BMI 40-49.9 (morbid obesity) (HCC)   COVID-19 virus infection   Necrotizing fasciitis (HCC)   Sepsis (HCC)   Hidradenitis suppurativa of axillae,chest wall, groins  Sepsis due to necrotizing fasciitis, panniculitis, and abscesses of abdominal wall and chest wall s/p I&D 8/15 by Dr. Michaell Cowing: Sepsis POA, resolved. - Continue cefepime, flagyl. No MRSA noted on culture. Blood cultures NGTD. Culture growing group C beta-hemolytic strep without anaerobes isolated. Continue cefepime with expectation of beta blactam susceptibility. WBC declining, remains elevated. Add probiotic. - Monitor final culture results and leukocytosis. Wound appears to have minimal cellulitis. - Wound vac started 8/17, wound care per surgery.   Covid-19 infection: SARS-CoV-2 PCR screening (incidentally) positive on 8/15 - Continue isolation for 10 days from positive test (or 21 days if develops severe symptoms).  - Patient satisfies criteria for regeneron monoclonal antibodies under EUA, received 8/18.   Uncontrolled T2DM: HbA1c 10.4%.  - Levemir increase to 25u BID, continue novolog 10u TIDWC + SSI -  Needs tight control for wound healing. Dietitian consulted.   HTN: Uncontrolled, pain probably contributing, but no hypotension/soft BPs noted. - Continue norvasc, will temporarily increase dose to 10mg , continue HCTZ, and add labetalol prn. Continue treating pain as above. - Telemetry thus far personally reviewed showing only NSR. DC telemetry.   Iron deficiency anemia: Stable postoperative counts. Ferritin, iron, %sat wnl.  - continue monitoring.  Candida intertrigo: s/p diflucan   Hypokalemia: Resolved with supplementation.  Morbid obesity: Estimated body mass index is 44.37 kg/m as calculated from the following:   Height as of this encounter: 5\' 7"  (1.702 m).   Weight as of this encounter: 128.5 kg.  DVT prophylaxis: Lovenox Code Status: Full Family Communication: None at bedside Disposition Plan:  Status is: Inpatient  Remains inpatient appropriate because:IV treatments appropriate due to intensity of illness or inability to take PO and Inpatient level of care appropriate due to severity of illness.    Dispo: The patient is from: Home              Anticipated d/c is to: Home              Anticipated d/c date is: 2 days              Patient currently is not medically stable to d/c.  Consultants:   General surgery  Procedures:  05/02/20 IRRIGATION AND DEBRIDEMENT ABDOMINAL and CHEST WALL NECROTIZING , MD   Antimicrobials:  Vancomycin 8/15 >>  Flagyl 8/15 >>  Cefepime 8/16 >>  Meropenem 8/15 x1  Clindamycin 8/15 x1  Zosyn 8/15 x1  Fluconazole x1  Subjective: No wound care yet today, plan is to change to MWF. Reports stable pain in abdomen with  slight right abdominal swelling over past couple days. No fevers, no cough or dyspnea. Having loose stools, not watery.   Objective: Vitals:   05/06/20 0605 05/06/20 0742 05/06/20 1030 05/06/20 1406  BP: 124/65 (!) 150/109 (!) 166/103 (!) 139/97  Pulse: 80 92 79   Resp: 18 (!) 24 (!) 22 16   Temp: 98.2 F (36.8 C) 98.2 F (36.8 C) 98.2 F (36.8 C) 98.1 F (36.7 C)  TempSrc: Oral Oral Oral Oral  SpO2: 100% 100% 100% 98%  Weight:      Height:        Intake/Output Summary (Last 24 hours) at 05/06/2020 1650 Last data filed at 05/06/2020 1607 Gross per 24 hour  Intake 3452.03 ml  Output 0 ml  Net 3452.03 ml   Filed Weights   05/02/20 0235 05/02/20 1645  Weight: 128.8 kg 128.5 kg   Gen: 34 y.o. female in no distress Pulm: Nonlabored breathing room air. Clear. CV: Regular rate and rhythm. No murmur, rub, or gallop. No JVD, no dependent edema. GI: Abdomen soft, tender in R > L LQ's. Non-distended, with normoactive bowel sounds.  Ext: Warm, no deformities Skin: 2 wounds on chest wall w/c/d/i dressings, lower abdominal wounds grossly without erythema. +warmth in RLQ wound.  Neuro: Alert and oriented. No focal neurological deficits. Psych: Judgement and insight appear fair. Mood euthymic & affect congruent. Behavior is appropriate.    Data Reviewed: I have personally reviewed following labs and imaging studies  CBC: Recent Labs  Lab 05/02/20 0306 05/02/20 0306 05/02/20 1711 05/03/20 0310 05/04/20 0409 05/05/20 0711 05/06/20 0346  WBC 30.7*   < > 41.5* 32.5* 16.4* 13.4* 12.1*  NEUTROABS 25.5*  --   --   --   --   --   --   HGB 11.0*   < > 8.8* 10.1* 8.8* 10.0* 9.7*  HCT 35.1*   < > 28.5* 34.0* 29.5* 32.4* 32.8*  MCV 77.8*   < > 79.6* 81.3 81.3 79.2* 81.2  PLT 345   < > 365 350 340 416* 393   < > = values in this interval not displayed.   Basic Metabolic Panel: Recent Labs  Lab 05/02/20 0306 05/02/20 1711 05/03/20 0310 05/04/20 0409 05/05/20 0711  NA 129*  --  140 139 135  K 3.6  --  3.3* 3.4* 3.5  CL 100  --  106 106 103  CO2 18*  --  GLUCOSE 342*  --  107* 183* 213*  BUN 5*  --  CREATININE 0.79 0.63 0.70 0.60 0.56  CALCIUM 8.3*  --  8.1* 8.3* 9.0  MG  --  1.8 1.8  --   --   PHOS  --   --  2.8  --   --    GFR: Estimated  Creatinine Clearance: 142.2 mL/min (by C-G formula based on SCr of 0.56 mg/dL). Liver Function Tests: Recent Labs  Lab 05/02/20 0306 05/04/20 0409 05/05/20 0711  AST 19 9* 11*  ALT ALKPHOS 82 73 87  BILITOT 0.6 0.6 0.4  PROT 7.6 6.7 7.6  ALBUMIN 3.1* 2.6* 2.7*   No results for input(s): LIPASE, AMYLASE in the last 168 hours. No results for input(s): AMMONIA in the last 168 hours. Coagulation Profile: No results for input(s): INR, PROTIME in the last 168 hours. Cardiac Enzymes: No results for input(s): CKTOTAL, CKMB, CKMBINDEX, TROPONINI in the last 168 hours. BNP (last 3 results) No results for  input(s): PROBNP in the last 8760 hours. HbA1C: No results for input(s): HGBA1C in the last 72 hours. CBG: Recent Labs  Lab 05/05/20 2002 05/05/20 2334 05/06/20 0358 05/06/20 0733 05/06/20 1232  GLUCAP 132* 176* 200* 175* 248*   Lipid Profile: No results for input(s): CHOL, HDL, LDLCALC, TRIG, CHOLHDL, LDLDIRECT in the last 72 hours. Thyroid Function Tests: No results for input(s): TSH, T4TOTAL, FREET4, T3FREE, THYROIDAB in the last 72 hours. Anemia Panel: Recent Labs    05/06/20 0346  FERRITIN 109  TIBC 233*  IRON 42   Urine analysis:    Component Value Date/Time   COLORURINE ORANGE (A) 05/02/2020 0422   APPEARANCEUR CLOUDY (A) 05/02/2020 0422   LABSPEC 1.010 05/02/2020 0422   PHURINE 6.5 05/02/2020 0422   GLUCOSEU >=500 (A) 05/02/2020 0422   HGBUR TRACE (A) 05/02/2020 0422   BILIRUBINUR NEGATIVE 05/02/2020 0422   KETONESUR 15 (A) 05/02/2020 0422   PROTEINUR 100 (A) 05/02/2020 0422   UROBILINOGEN 1.0 01/19/2012 0015   NITRITE NEGATIVE 05/02/2020 0422   LEUKOCYTESUR TRACE (A) 05/02/2020 0422   Recent Results (from the past 240 hour(s))  SARS Coronavirus 2 by RT PCR (hospital order, performed in [facility] lab) Nasopharyngeal Nasopharyngeal Swab     Status: Abnormal   Collection Time: 05/02/20  2:58 AM   Specimen: Nasopharyngeal Swab  Result  Value Ref Range Status   SARS Coronavirus 2 POSITIVE (A) NEGATIVE Final    Comment: RESULT CALLED TO, READ BACK BY AND VERIFIED WITH: MAYNARD,C AT 0407 ON [id number] BY CHERESNOWSKY,T (NOTE) SARS-CoV-2 target nucleic acids are DETECTED  SARS-CoV-2 RNA is generally detectable in upper respiratory specimens  during the acute phase of infection.  Positive results are indicative  of the presence of the identified virus, but do not rule out bacterial infection or co-infection with other pathogens not detected by the test.  Clinical correlation with patient history and  other diagnostic information is necessary to determine patient infection status.  The expected result is negative.  Fact Sheet for Patients:   BoilerBrush.com.cyhttps://www.fda.gov/media/136312/download   Fact Sheet for Healthcare Providers:   https://pope.com/https://www.fda.gov/media/136313/download    This test is not yet approved or cleared by the [place] FDA and  has been authorized for detection and/or diagnosis of SARS-CoV-2 by FDA under an Emergency Use Authorization (EUA).  This EUA will remain in effect (mean ing this test can be used) for the duration of  the COVID-19 declaration under Section 564(b)(1) of the Act, 21 U.S.C. section 360-bbb-3(b)(1), unless the authorization is terminated or revoked sooner.  Performed at [facility] [place], [address] [place], [place] [address]   Culture, blood (routine x 2)     Status: None (Preliminary result)   Collection Time: 05/02/20  5:30 AM   Specimen: BLOOD LEFT HAND  Result Value Ref Range Status   Specimen Description   Final    BLOOD LEFT HAND Performed at [facility] [place], [address] [place], [place] [address]    Special Requests   Final    BOTTLES DRAWN AEROBIC AND ANAEROBIC Blood Culture adequate volume Performed at [facility] [place], [address] [place], [place] [address]    Culture   Final    NO GROWTH 4 DAYS Performed at [facility], [address] [place], [place] [address]    Report Status PENDING  Incomplete  Culture, blood (routine x 2)     Status: None (Preliminary result)   Collection  Time: 05/02/20  5:40 AM   Specimen: BLOOD LEFT WRIST  Result Value Ref Range Status   Specimen Description   Final    BLOOD LEFT WRIST Performed at [facility], [address] [place], [place] [address]    Special Requests   Final    BOTTLES DRAWN AEROBIC AND ANAEROBIC Blood Culture adequate volume Performed at [facility], [address] [place], [place] [address]    Culture   Final    NO GROWTH 4 DAYS Performed at [facility], [address], [place], [place] [address]    Report Status PENDING  Incomplete  Aerobic/Anaerobic Culture (surgical/deep wound)     Status: Abnormal (Preliminary result)   Collection Time: 05/02/20  1:16 PM   Specimen: Wound  Result Value Ref Range Status   Specimen Description   Final    WOUND Performed at [facility], [address], [place], [place] [address]    Special Requests   Final    NONE Performed at [facility], [address], [place], [place] [address]    Gram Stain   Final    NO WBC SEEN MODERATE GRAM NEGATIVE RODS FEW GRAM POSITIVE COCCI RARE GRAM POSITIVE RODS Performed at [facility], [address], [place], [place] [address]    Culture (A)  Final    STREPTOCOCCUS GROUP C Beta hemolytic streptococci are predictably susceptible to penicillin and other beta lactams. Susceptibility testing not routinely performed. NO ANAEROBES ISOLATED; CULTURE IN PROGRESS FOR 5 DAYS    Report Status PENDING  Incomplete  Culture, blood (routine x 2)     Status: None (Preliminary result)   Collection Time: 05/02/20  5:11 PM   Specimen: BLOOD  Result Value Ref Range Status   Specimen Description   Final    BLOOD Performed at [facility], [address] [place], [place] [address]    Special  Requests   Final    BOTTLES DRAWN AEROBIC AND ANAEROBIC Blood Culture adequate volume Performed at [facility], [address] [place], [place] [address]    Culture   Final    NO GROWTH 4 DAYS Performed at [facility], [address] [place], [place] [address]    Report Status PENDING  Incomplete  Culture, blood (routine x 2)     Status: None (Preliminary result)   Collection Time: 05/02/20  5:31 PM   Specimen: BLOOD RIGHT HAND  Result Value Ref Range Status   Specimen Description   Final    BLOOD RIGHT HAND Performed at [facility], [address], [place], [place] [address]    Special Requests   Final    BOTTLES DRAWN AEROBIC AND ANAEROBIC Blood Culture adequate volume Performed at [facility], [address], [place], [place] [address]    Culture   Final    NO GROWTH 4 DAYS Performed at [facility], [address] [place], [place] [address]    Report Status PENDING  Incomplete      Radiology Studies: No results found.  Scheduled Meds: . acetaminophen  1,000 mg Oral TID  . [START ON 05/07/2020] amLODipine  10 mg Oral Daily  . amLODipine  5 mg Oral Once  . Chlorhexidine Gluconate Cloth  6 each Topical Daily  . enoxaparin (LOVENOX) injection  40 mg Subcutaneous Q24H  . feeding supplement (GLUCERNA SHAKE)  237 mL  Oral BID BM  . gabapentin  400 mg Oral TID  . hydrochlorothiazide  25 mg Oral Daily  . [START ON 05/07/2020] insulin aspart  0-20 Units Subcutaneous TID WC  . insulin aspart  10 Units Subcutaneous TID WC  . insulin detemir  25 Units Subcutaneous BID  . lip balm  1 application Topical BID  . mouth rinse  15 mL Mouth Rinse BID  . psyllium  1 packet Oral BID  . saccharomyces boulardii  250 mg Oral BID   Continuous Infusions: . sodium chloride Stopped (05/02/20 1000)  . ceFEPime (MAXIPIME) IV 2 g (05/06/20 1447)  . metronidazole 500 mg (05/06/20 1026)     LOS: 4 days   Time spent: 35  minutes.  Tyrone Nine, MD [facility] [email or web address] 05/06/2020, 4:50 PM

## 2020-05-06 NOTE — Consult Note (Signed)
WOC contacted [facility] regarding dressing changes on this patient. Updated orders will change panniculectomy site NPWT dressing again on Friday 05/07/20. Orders updated. Supplies ordered and requested to be placed into the patient's room.   Colm [facility]ford [facility], CNS, [employer] [phone]

## 2020-05-06 NOTE — Discharge Instructions (Signed)
WOUND CARE  It is important that the wound be kept open.   -Keeping the skin edges apart will allow the wound to gradually heal from the base upwards.   - If the skin edges of the wound close too early, a new fluid pocket can form and infection can occur. -This is the reason to pack deeper wounds with gauze or ribbon -This is why drained wounds cannot be sewed closed right away  A healthy wound should form a lining of bright red "beefy" granulating tissue that will help shrink the wound and help the edges grow new skin into it.   -A little mucus / yellow discharge is normal (the body's natural way to try and form a scab) and should be gently washed off with soap and water with daily dressing changes.  -Green or foul smelling drainage implies bacterial colonization and can slow wound healing - a short course of antibiotic ointment (3-5 days) can help it clear up.  Call the doctor if it does not improve or worsens  -Avoid use of antibiotic ointments for more than a week as they can slow wound healing over time.    -Sometimes other wound care products will be used to reduce need for dressing changes and/or help clean up dirty wounds -Sometimes the surgeon needs to debride the wound in the office to remove dead or infected tissue out of the wound so it can heal more quickly and safely.    Change the dressing at least once a day -Wash the wound with mild soap and water gently every day.  It is good to shower or bathe the wound to help it clean out. -Use clean 4x4 gauze for medium/large wounds or ribbon plain NU-gauze for smaller wounds (it does not need to be sterile, just clean) -Keep the raw wound moist with a little saline or KY (saline) gel on the gauze.  -A dry wound will take longer to heal.  -Keep the skin dry around the wound to prevent breakdown and irritation. -Pack the wound down to the base -The goal is to keep the skin apart, not overpack the wound -Use a Q-tip or blunt-tipped kabob  stick toothpick to push the gauze down to the base in narrow or deep wounds   -Cover with a clean gauze and tape -paper or Medipore tape tend to be gentle on the skin -rotate the orientation of the tape to avoid repeated stress/trauma on the skin -using an ACE or Coban wrap on wounds on arms or legs can be used instead.  Complete all antibiotics through the entire prescription to help the infection heal and prevent new places of infection   Returning the see the surgeon is helpful to follow the healing process and help the wound close as fast as possible.    Hidradenitis Suppurativa Hidradenitis suppurativa is a long-term (chronic) skin disease. It is similar to a severe form of acne, but it affects areas of the body where acne would be unusual, especially areas of the body where skin rubs against skin and becomes moist. These include:  Underarms.  Groin.  Genital area.  Buttocks.  Upper thighs.  Breasts. Hidradenitis suppurativa may start out as small lumps or pimples caused by blocked sweat glands or hair follicles. Pimples may develop into deep sores that break open (rupture) and drain pus. Over time, affected areas of skin may thicken and become scarred. This condition is rare and does not spread from person to person (non-contagious). What are the  causes? The exact cause of this condition is not known. It may be related to:  Female and female hormones.  An overactive disease-fighting system (immune system). The immune system may over-react to blocked hair follicles or sweat glands and cause swelling and pus-filled sores. What increases the risk? You are more likely to develop this condition if you:  Are female.  Are 70-67 years old.  Have a family history of hidradenitis suppurativa.  Have a personal history of acne.  Are overweight.  Smoke.  Take the medicine lithium. What are the signs or symptoms? The first symptoms are usually painful bumps in the skin,  similar to pimples. The condition may get worse over time (progress), or it may only cause mild symptoms. If the disease progresses, symptoms may include:  Skin bumps getting bigger and growing deeper into the skin.  Bumps rupturing and draining pus.  Itchy, infected skin.  Skin getting thicker and scarred.  Tunnels under the skin (fistulas) where pus drains from a bump.  Pain during daily activities, such as pain during walking if your groin area is affected.  Emotional problems, such as stress or depression. This condition may affect your appearance and your ability or willingness to wear certain clothes or do certain activities. How is this diagnosed? This condition is diagnosed by a health care provider who specializes in skin diseases (dermatologist). You may be diagnosed based on:  Your symptoms and medical history.  A physical exam.  Testing a pus sample for infection.  Blood tests. How is this treated? Your treatment will depend on how severe your symptoms are. The same treatment will not work for everybody with this condition. You may need to try several treatments to find what works best for you. Treatment may include:  Cleaning and bandaging (dressing) your wounds as needed.  Lifestyle changes, such as new skin care routines.  Taking medicines, such as: ? Antibiotics. ? Acne medicines. ? Medicines to reduce the activity of the immune system. ? A diabetes medicine (metformin). ? Birth control pills, for women. ? Steroids to reduce swelling and pain.  Working with a mental health care provider, if you experience emotional distress due to this condition. If you have severe symptoms that do not get better with medicine, you may need surgery. Surgery may involve:  Using a laser to clear the skin and remove hair follicles.  Opening and draining deep sores.  Removing the areas of skin that are diseased and scarred. Follow these instructions at  home: Medicines   Take over-the-counter and prescription medicines only as told by your health care provider.  If you were prescribed an antibiotic medicine, take it as told by your health care provider. Do not stop taking the antibiotic even if your condition improves. Skin care  If you have open wounds, cover them with a clean dressing as told by your health care provider. Keep wounds clean by washing them gently with soap and water when you bathe.  Do not shave the areas where you get hidradenitis suppurativa.  Do not wear deodorant.  Wear loose-fitting clothes.  Try to avoid getting overheated or sweaty. If you get sweaty or wet, change into clean, dry clothes as soon as you can.  To help relieve pain and itchiness, cover sore areas with a warm, clean washcloth (warm compress) for 5-10 minutes as often as needed.  If told by your health care provider, take a bleach bath twice a week: ? Fill your bathtub halfway with water. ?  Pour in  cup of unscented household bleach. ? Soak in the tub for 5-10 minutes. ? Only soak from the neck down. Avoid water on your face and hair. ? Shower to rinse off the bleach from your skin. General instructions  Learn as much as you can about your disease so that you have an active role in your treatment. Work closely with your health care provider to find treatments that work for you.  If you are overweight, work with your health care provider to lose weight as recommended.  Do not use any products that contain nicotine or tobacco, such as cigarettes and e-cigarettes. If you need help quitting, ask your health care provider.  If you struggle with living with this condition, talk with your health care provider or work with a mental health care provider as recommended.  Keep all follow-up visits as told by your health care provider. This is important. Where to find more information  Hidradenitis Suppurativa Foundation, Inc.:  https://www.hs-foundation.org/ Contact a health care provider if you have:  A flare-up of hidradenitis suppurativa.  A fever or chills.  Trouble controlling your symptoms at home.  Trouble doing your daily activities because of your symptoms.  Trouble dealing with emotional problems related to your condition. Summary  Hidradenitis suppurativa is a long-term (chronic) skin disease. It is similar to a severe form of acne, but it affects areas of the body where acne would be unusual.  The first symptoms are usually painful bumps in the skin, similar to pimples. The condition may get worse over time (progress), or it may only cause mild symptoms.  If you have open wounds, cover them with a clean dressing as told by your health care provider. Keep wounds clean by washing them gently with soap and water when you bathe.  Besides skin care, treatment may include medicines, laser treatment, and surgery. This information is not intended to replace advice given to you by your health care provider. Make sure you discuss any questions you have with your health care provider. Document Revised: 09/12/2017 Document Reviewed: 09/12/2017 Elsevier Patient Education  2020 [employer]  Carbohydrate Counting For People With Diabetes  Foods with carbohydrates make your blood glucose level go up. Learning how to count carbohydrates can help you control your blood glucose levels. First, identify the foods you eat that contain carbohydrates. Then, using the Foods with Carbohydrates chart, determine about how much carbohydrates are in your meals and snacks. Make sure you are eating foods with fiber, protein, and healthy fat along with your carbohydrate foods. Foods with Carbohydrates The following table shows carbohydrate foods that have about 15 grams of carbohydrate each. Using measuring cups, spoons, or a food scale when you first begin learning about carbohydrate counting can help you learn about the  portion sizes you typically eat. The following foods have 15 grams carbohydrate each:  Grains . 1 slice bread (1 ounce)  . 1 small tortilla (6-inch size)  .  large bagel (1 ounce)  . 1/3 cup pasta or rice (cooked)  .  hamburger or hot dog bun ( ounce)  .  cup cooked cereal  .  to  cup ready-to-eat cereal  . 2 taco shells (5-inch size) Fruit . 1 small fresh fruit ( to 1 cup)  .  medium banana  . 17 small grapes (3 ounces)  . 1 cup melon or berries  .  cup canned or frozen fruit  . 2 tablespoons dried fruit (blueberries, cherries, cranberries, raisins)  .  cup unsweetened fruit juice  Starchy Vegetables .  cup cooked beans, peas, corn, potatoes/sweet potatoes  .  large baked potato (3 ounces)  . 1 cup acorn or butternut squash  Snack Foods . 3 to 6 crackers  . 8 potato chips or 13 tortilla chips ( ounce to 1 ounce)  . 3 cups popped popcorn  Dairy . 3/4 cup (6 ounces) nonfat plain yogurt, or yogurt with sugar-free sweetener  . 1 cup milk  . 1 cup plain rice, soy, coconut or flavored almond milk Sweets and Desserts .  cup ice cream or frozen yogurt  . 1 tablespoon jam, jelly, pancake syrup, table sugar, or honey  . 2 tablespoons light pancake syrup  . 1 inch square of frosted cake or 2 inch square of unfrosted cake  . 2 small cookies (2/3 ounce each) or  large cookie  Sometimes you'll have to estimate carbohydrate amounts if you don't know the exact recipe. One cup of mixed foods like soups can have 1 to 2 carbohydrate servings, while some casseroles might have 2 or more servings of carbohydrate. Foods that have less than 20 calories in each serving can be counted as "free" foods. Count 1 cup raw vegetables, or  cup cooked non-starchy vegetables as "free" foods. If you eat 3 or more servings at one meal, then count them as 1 carbohydrate serving.  Foods without Carbohydrates  Not all foods contain carbohydrates. Meat, some dairy, fats, non-starchy vegetables, and many  beverages don't contain carbohydrate. So when you count carbohydrates, you can generally exclude chicken, pork, beef, fish, seafood, eggs, tofu, cheese, butter, sour cream, avocado, nuts, seeds, olives, mayonnaise, water, black coffee, unsweetened tea, and zero-calorie drinks. Vegetables with no or low carbohydrate include green beans, cauliflower, tomatoes, and onions. How much carbohydrate should I eat at each meal?  Carbohydrate counting can help you plan your meals and manage your weight. Following are some starting points for carbohydrate intake at each meal. Work with your registered dietitian nutritionist to find the best range that works for your blood glucose and weight.   To Lose Weight To Maintain Weight  Women 2 - 3 carb servings 3 - 4 carb servings  Men 3 - 4 carb servings 4 - 5 carb servings  Checking your blood glucose after meals will help you know if you need to adjust the timing, type, or number of carbohydrate servings in your meal plan. Achieve and keep a healthy body weight by balancing your food intake and physical activity.  Tips How should I plan my meals?  Plan for half the food on your plate to include non-starchy vegetables, like salad greens, broccoli, or carrots. Try to eat 3 to 5 servings of non-starchy vegetables every day. Have a protein food at each meal. Protein foods include chicken, fish, meat, eggs, or beans (note that beans contain carbohydrate). These two food groups (non-starchy vegetables and proteins) are low in carbohydrate. If you fill up your plate with these foods, you will eat less carbohydrate but still fill up your stomach. Try to limit your carbohydrate portion to  of the plate.  What fats are healthiest to eat?  Diabetes increases risk for heart disease. To help protect your heart, eat more healthy fats, such as olive oil, nuts, and avocado. Eat less saturated fats like butter, cream, and high-fat meats, like bacon and sausage. Avoid trans fats, which  are in all foods that list "partially hydrogenated oil" as an ingredient. What should I drink?  Choose drinks that are not sweetened with sugar. The healthiest choices are water, carbonated or seltzer waters, and tea and coffee without added sugars.  Sweet drinks will make your blood glucose go up very quickly. One serving of soda or energy drink is  cup. It is best to drink these beverages only if your blood glucose is low.  Artificially sweetened, or diet drinks, typically do not increase your blood glucose if they have zero calories in them. Read labels of beverages, as some diet drinks do have carbohydrate and will raise your blood glucose. Label Reading Tips Read Nutrition Facts labels to find out how many grams of carbohydrate are in a food you want to eat. Don't forget: sometimes serving sizes on the label aren't the same as how much food you are going to eat, so you may need to calculate how much carbohydrate is in the food you are serving yourself.   Carbohydrate Counting for People with Diabetes Sample 1-Day Menu  Breakfast  cup yogurt, low fat, low sugar (1 carbohydrate serving)   cup cereal, ready-to-eat, unsweetened (1 carbohydrate serving)  1 cup strawberries (1 carbohydrate serving)   cup almonds ( carbohydrate serving)  Lunch 1, 5 ounce can chunk light tuna  2 ounces cheese, low fat cheddar  6 whole wheat crackers (1 carbohydrate serving)  1 small apple (1 carbohydrate servings)   cup carrots ( carbohydrate serving)   cup snap peas  1 cup 1% milk (1 carbohydrate serving)   Evening Meal Stir fry made with: 3 ounces chicken  1 cup brown rice (3 carbohydrate servings)   cup broccoli ( carbohydrate serving)   cup green beans   cup onions  1 tablespoon olive oil  2 tablespoons teriyaki sauce ( carbohydrate serving)  Evening Snack 1 extra small banana (1 carbohydrate serving)  1 tablespoon peanut butter   Carbohydrate Counting for People with Diabetes Vegan  Sample 1-Day Menu  Breakfast 1 cup cooked oatmeal (2 carbohydrate servings)   cup blueberries (1 carbohydrate serving)  2 tablespoons flaxseeds  1 cup soymilk fortified with calcium and vitamin D  1 cup coffee  Lunch 2 slices whole wheat bread (2 carbohydrate servings)   cup baked tofu   cup lettuce  2 slices tomato  2 slices avocado   cup baby carrots ( carbohydrate serving)  1 orange (1 carbohydrate serving)  1 cup soymilk fortified with calcium and vitamin D   Evening Meal Burrito made with: 1 6-inch corn tortilla (1 carbohydrate serving)  1 cup refried vegetarian beans (2 carbohydrate servings)   cup chopped tomatoes   cup lettuce   cup salsa  1/3 cup brown rice (1 carbohydrate serving)  1 tablespoon olive oil for rice   cup zucchini   Evening Snack 6 small whole grain crackers (1 carbohydrate serving)  2 apricots ( carbohydrate serving)   cup unsalted peanuts ( carbohydrate serving)    Carbohydrate Counting for People with Diabetes Vegetarian (Lacto-Ovo) Sample 1-Day Menu  Breakfast 1 cup cooked oatmeal (2 carbohydrate servings)   cup blueberries (1 carbohydrate serving)  2 tablespoons flaxseeds  1 egg  1 cup 1% milk (1 carbohydrate serving)  1 cup coffee  Lunch 2 slices whole wheat bread (2 carbohydrate servings)  2 ounces low-fat cheese   cup lettuce  2 slices tomato  2 slices avocado   cup baby carrots ( carbohydrate serving)  1 orange (1 carbohydrate serving)  1 cup unsweetened tea  Evening Meal Burrito made with: 1 6-inch corn  tortilla (1 carbohydrate serving)   cup refried vegetarian beans (1 carbohydrate serving)   cup tomatoes   cup lettuce   cup salsa  1/3 cup brown rice (1 carbohydrate serving)  1 tablespoon olive oil for rice   cup zucchini  1 cup 1% milk (1 carbohydrate serving)  Evening Snack 6 small whole grain crackers (1 carbohydrate serving)  2 apricots ( carbohydrate serving)   cup unsalted peanuts ( carbohydrate  serving)    Copyright 2020  Academy of Nutrition and Dietetics. All rights reserved.  Using Nutrition Labels: Carbohydrate  . Serving Size  . Look at the serving size. All the information on the label is based on this portion. Jolyne Loa Per Container  . The number of servings contained in the package. . Guidelines for Carbohydrate  . Look at the total grams of carbohydrate in the serving size.  . 1 carbohydrate choice = 15 grams of carbohydrate. Range of Carbohydrate Grams Per Choice  Carbohydrate Grams/Choice Carbohydrate Choices  6-10   11-20 1  21-25 1  26-35 2  36-40 2  41-50 3  51-55 3  56-65 4  66-70 4  71-80 5    Copyright 2020  Academy of Nutrition and Dietetics. All rights reserved.

## 2020-05-06 NOTE — Progress Notes (Signed)
Pharmacy IV to PO conversion  The patient is receiving diphenhydramine by the intravenous route.  Based on the following criteria approved by the Pharmacy and Therapeutics Committee and the Medical Executive Committee, diphenhydramine is being converted to the equivalent oral dose form.   Not prescribed to treat or prevent a severe allergic reaction   Not prescribed as premedication prior to receiving blood product, biologic medication, antimicrobial, or chemotherapy agent   The patient has tolerated at least one dose of an oral or enteral medication   The patient has no evidence of active gastrointestinal bleeding or impaired GI absorption (gastrectomy, short bowel, patient on TNA or NPO).   The patient is not undergoing procedural sedation  If you have any questions about this conversion, please contact the Pharmacy Department (ext [phone]).  Thank you.  Bernadene Person, PharmD, BCPS [phone] 05/06/2020, 10:06 AM

## 2020-05-06 NOTE — Progress Notes (Signed)
Nutrition Note  RD consulted for nutrition education regarding diabetes. Pt with PMHx of type 2 diabetes. Pt positive for COVID-19.   Pt currently receiving Glucerna shakes and states she likes them. Will continue given increased needs from active COVID-19 infection.   Lab Results  Component Value Date   HGBA1C 10.4 (H) 05/02/2020    RD provided "Carbohydrate Counting for People with Diabetes" handout from the Academy of Nutrition and Dietetics. Discussed different food groups and their effects on blood sugar, emphasizing carbohydrate-containing foods. Provided list of carbohydrates and recommended serving sizes of common foods.  Discussed importance of controlled and consistent carbohydrate intake throughout the day. Provided examples of ways to balance meals/snacks and encouraged intake of high-fiber, whole grain complex carbohydrates. Teach back method used.  Expect good compliance. Pt expressed understanding and RD answered all of pt's questions.  Body mass index is 44.37 kg/m. Pt meets criteria for morbid obesity based on current BMI.  Current diet order is HH/CHO modifed, patient is consuming approximately 75% of meals at this time. Labs and medications reviewed. No further nutrition interventions warranted at this time. If additional nutrition issues arise, please re-consult RD.  Tilda Franco, MS, RD, LDN Inpatient Clinical Dietitian Contact information available via Am[employer]

## 2020-05-07 LAB — CULTURE, BLOOD (ROUTINE X 2)
Culture: NO GROWTH
Culture: NO GROWTH
Culture: NO GROWTH
Culture: NO GROWTH
Special Requests: ADEQUATE
Special Requests: ADEQUATE
Special Requests: ADEQUATE
Special Requests: ADEQUATE

## 2020-05-07 LAB — CBC
HCT: 31.3 % — ABNORMAL LOW (ref 36.0–46.0)
Hemoglobin: 9.6 g/dL — ABNORMAL LOW (ref 12.0–15.0)
MCH: 24.4 pg — ABNORMAL LOW (ref 26.0–34.0)
MCHC: 30.7 g/dL (ref 30.0–36.0)
MCV: 79.6 fL — ABNORMAL LOW (ref 80.0–100.0)
Platelets: 403 10*3/uL — ABNORMAL HIGH (ref 150–400)
RBC: 3.93 MIL/uL (ref 3.87–5.11)
RDW: 16.1 % — ABNORMAL HIGH (ref 11.5–15.5)
WBC: 13.5 10*3/uL — ABNORMAL HIGH (ref 4.0–10.5)
nRBC: 0 % (ref 0.0–0.2)

## 2020-05-07 LAB — AEROBIC/ANAEROBIC CULTURE W GRAM STAIN (SURGICAL/DEEP WOUND): Gram Stain: NONE SEEN

## 2020-05-07 LAB — GLUCOSE, CAPILLARY
Glucose-Capillary: 141 mg/dL — ABNORMAL HIGH (ref 70–99)
Glucose-Capillary: 308 mg/dL — ABNORMAL HIGH (ref 70–99)
Glucose-Capillary: 122 mg/dL — ABNORMAL HIGH (ref 70–99)
Glucose-Capillary: 278 mg/dL — ABNORMAL HIGH (ref 70–99)
Glucose-Capillary: 184 mg/dL — ABNORMAL HIGH (ref 70–99)

## 2020-05-07 MED ORDER — SODIUM CHLORIDE 0.9 % IV SOLN
3.0000 g | Freq: Four times a day (QID) | INTRAVENOUS | Status: DC
  Administered 2020-05-07 – 2020-05-12 (×20): 3 g via INTRAVENOUS
  Filled 2020-05-07: qty 3
  Filled 2020-05-07: qty 8
  Filled 2020-05-07 (×5): qty 3
  Filled 2020-05-07: qty 8
  Filled 2020-05-07 (×5): qty 3
  Filled 2020-05-07: qty 8
  Filled 2020-05-07: qty 3
  Filled 2020-05-07: qty 8
  Filled 2020-05-07: qty 3
  Filled 2020-05-07: qty 8
  Filled 2020-05-07 (×4): qty 3

## 2020-05-07 NOTE — Progress Notes (Signed)
Inpatient Diabetes Program Recommendations  AACE/ADA: New Consensus Statement on Inpatient Glycemic Control (2015)  Target Ranges:  Prepandial:   less than 140 mg/dL      Peak postprandial:   less than 180 mg/dL (1-2 hours)      Critically ill patients:  140 - 180 mg/dL   Lab Results  Component Value Date   GLUCAP 184 (H) 05/07/2020   HGBA1C 10.4 (H) 05/02/2020    Review of Glycemic Control  Current orders for Inpatient glycemic control:   Levemir 25 units bid, Novolog 0-20 units tidwc + 10 units tidwc  CBGs: 308, 122, 278, 184 mg/dL. Inpatient Diabetes Program Recommendations:     Add Novolog HS correction  Note: Received secure text from RN regarding pt ordering food from [employer] and consuming fruit/candy from family members. Pt had been educated on importance of controlling blood sugars and eating healthy diet in past 2 days. Pt states she was motivated to make changes with diet. RN discussed importance of not eating food outside hospital meals, as diet is a major role in glycemic control.  Follow daily.  Thank you. Ailene Ards, RD, LDN, CDE Inpatient Diabetes Coordinator [phone]

## 2020-05-07 NOTE — Consult Note (Signed)
WOC Nurse wound follow up Wound type: Surgical debridement, full thickness 1. Under pannus mid to right lateral 2. Left groin 3. Left chest wall 4. Right chest wall Measurement: 1. 7cm c 28cm x (did not measure depth today) 2. 1cm x 3.6xm x 2.8cm  3. 1.5cm x 4cm x 2.5cm  4. 1cm x 3cm x 3cm  Wound bed: All the wounds are pink, clean and moist Minimal fibrinous material in the pannus wound Drainage (amount, consistency, odor) minimal, serosanginous from all sites.  Periwound: intact, pannus wound is extremely painful for dressing changes  Dressing procedure/placement/frequency: Removed old NPWT dressing from the pannus Filled wound with  _2__ piece of black foam Sealed NPWT dressing at HG Patient received IVpain medication per bedside nurse prior to dressing change Patient tolerated but was very anxious for dressing and was in quite a bit of pain, needs premedication to be given a little while longer before dressing change.   WOC nurse will continue to provide NPWT dressing changed due to the complexity of the dressing change.   Nylia Gavina [facility], CNS, [employer] [phone]

## 2020-05-07 NOTE — Progress Notes (Signed)
Inpatient Diabetes Program Recommendations  AACE/ADA: New Consensus Statement on Inpatient Glycemic Control (2015)  Target Ranges:  Prepandial:   less than 140 mg/dL      Peak postprandial:   less than 180 mg/dL (1-2 hours)      Critically ill patients:  140 - 180 mg/dL   Lab Results  Component Value Date   GLUCAP 122 (H) 05/07/2020   HGBA1C 10.4 (H) 05/02/2020    Review of Glycemic Control  Diabetes history: DM 2 Outpatient Diabetes medications: Metformin 500 mg tid (70/30 vial ans syringe before she lost insurance) Current orders for Inpatient glycemic control:  Levemir 25 units bid Novolog 0-20 units tid Novolog 10 units tid  Glucerna bid between meals  Inpatient Diabetes Program Recommendations:    Spoke with pt regarding A1c level. Pt just recently got insurance in and started taking her metformin 500 mg tid consistently the past 3 months, not checking glucose levels at all.  Spoke with pt about A1c level and that she probably needs insulin at time of d/c. Would Lantus with copay card I will send pt over email or 70/30 if pt requiring a lot of meal coverage (maybe cheaper out of pocket from [employer]). Pt is use to using the vial and syringe, however would prefer insulin pen.  Will send videos via pt email regarding insulin pen use, hypoglycemia, checking CBGs.  Pt has meter and supplies at home. Will follow up with a new PCP which she will contact her insurance app to see providers within the [facility] that accept her insurance.  -  Lantus solostar insulin pen order # [id number] -  Insulin pen needles order # [id number]  Thanks,  Christena Deem RN, MSN, BC-ADM Inpatient Diabetes Coordinator Team Pager [phone] (8a-5p)

## 2020-05-07 NOTE — Progress Notes (Signed)
[facility] Progress Note  5 Days Post-Op  Subjective: CC-  Premedicated for vac change. Still has some abdominal pain but it is well controlled, other than during dressing change.  Objective: Vital signs in last 24 hours: Temp:  [98.1 F (36.7 C)-98.7 F (37.1 C)] 98.7 F (37.1 C) (08/20 0406) Pulse Rate:  [79-84] 84 (08/20 0406) Resp:  [16-22] 21 (08/20 0406) BP: (139-166)/(91-103) 158/99 (08/20 0406) SpO2:  [98 %-100 %] 99 % (08/20 0406) Last BM Date: 05/06/20  Intake/Output from previous day: 08/19 0701 - 08/20 0700 In: 1302 [P.O.:1302] Out: -  Intake/Output this shift: Total I/O In: 236 [P.O.:236] Out: -   PE: Gen:  Alert, NAD, tearful at times Skin: -Bilateral chest wall wounds: clean, no purulent drainage   Left groin wound: deep but clean without purulent drainage   Large lower abdominal wound: mostly beefy red with purulent drainage, mild induration right lateral side       Lab Results:  Recent Labs    05/06/20 0346 05/07/20 0316  WBC 12.1* 13.5*  HGB 9.7* 9.6*  HCT 32.8* 31.3*  PLT 393 403*   BMET Recent Labs    05/05/20 0711  NA 135  K 3.5  CL 103  CO2 23  GLUCOSE 213*  BUN 8  CREATININE 0.56  CALCIUM 9.0   PT/INR No results for input(s): LABPROT, INR in the last 72 hours. CMP     Component Value Date/Time   NA 135 05/05/2020 0711   K 3.5 05/05/2020 0711   CL 103 05/05/2020 0711   CO2 23 05/05/2020 0711   GLUCOSE 213 (H) 05/05/2020 0711   BUN 8 05/05/2020 0711   CREATININE 0.56 05/05/2020 0711   CALCIUM 9.0 05/05/2020 0711   PROT 7.6 05/05/2020 0711   ALBUMIN 2.7 (L) 05/05/2020 0711   AST 11 (L) 05/05/2020 0711   ALT 12 05/05/2020 0711   ALKPHOS 87 05/05/2020 0711   BILITOT 0.4 05/05/2020 0711   GFRNONAA >60 05/05/2020 0711   GFRAA >60 05/05/2020 0711   Lipase     Component Value Date/Time   LIPASE 28 03/25/2020 0746       Studies/Results: No results found.  Anti-infectives: Anti-infectives  (From admission, onward)   Start     Dose/Rate Route Frequency Ordered Stop   05/03/20 1700  fluconazole (DIFLUCAN) IVPB 100 mg  Status:  Discontinued        100 mg 50 mL/hr over 60 Minutes Intravenous Every 24 hours 05/02/20 1603 05/03/20 1040   05/03/20 1045  metroNIDAZOLE (FLAGYL) IVPB 500 mg        500 mg 100 mL/hr over 60 Minutes Intravenous Every 8 hours 05/03/20 1039     05/03/20 1045  ceFEPIme (MAXIPIME) 2 g in sodium chloride 0.9 % 100 mL IVPB        2 g 200 mL/hr over 30 Minutes Intravenous Every 8 hours 05/03/20 1040     05/03/20 1000  meropenem (MERREM) 1 g in sodium chloride 0.9 % 100 mL IVPB  Status:  Discontinued        1 g 200 mL/hr over 30 Minutes Intravenous Every 8 hours 05/03/20 0951 05/03/20 1040   05/02/20 2000  vancomycin (VANCOREADY) IVPB 1250 mg/250 mL  Status:  Discontinued        1,250 mg 166.7 mL/hr over 90 Minutes Intravenous Every 12 hours 05/02/20 0541 05/02/20 1022   05/02/20 1700  metroNIDAZOLE (FLAGYL) IVPB 500 mg  Status:  Discontinued  500 mg 100 mL/hr over 60 Minutes Intravenous Every 8 hours 05/02/20 1603 05/03/20 0930   05/02/20 1615  vancomycin (VANCOCIN) IVPB 1000 mg/200 mL premix  Status:  Discontinued        1,000 mg 200 mL/hr over 60 Minutes Intravenous  Once 05/02/20 1603 05/02/20 1613   05/02/20 1615  fluconazole (DIFLUCAN) IVPB 200 mg        200 mg 100 mL/hr over 60 Minutes Intravenous  Once 05/02/20 1603 05/02/20 1744   05/02/20 1400  vancomycin (VANCOREADY) IVPB 1250 mg/250 mL  Status:  Discontinued        1,250 mg 166.7 mL/hr over 90 Minutes Intravenous Every 8 hours 05/02/20 1022 05/04/20 1453   05/02/20 1315  clindamycin (CLEOCIN) IVPB 900 mg  Status:  Discontinued        900 mg 100 mL/hr over 30 Minutes Intravenous On call to O.R. 05/02/20 0952 05/02/20 1527   05/02/20 1200  meropenem (MERREM) 1 g in sodium chloride 0.9 % 100 mL IVPB  Status:  Discontinued        1 g 200 mL/hr over 30 Minutes Intravenous Every 8 hours  05/02/20 1019 05/02/20 1603   05/02/20 1030  vancomycin (VANCOREADY) IVPB 1500 mg/300 mL  Status:  Discontinued        1,500 mg 150 mL/hr over 120 Minutes Intravenous NOW 05/02/20 1019 05/02/20 1020   05/02/20 0544  vancomycin (VANCOCIN) 1000 MG powder       Note to Pharmacy: Gordy Savers   : cabinet override      05/02/20 0544 05/02/20 0611   05/02/20 0530  piperacillin-tazobactam (ZOSYN) IVPB 3.375 g        3.375 g 100 mL/hr over 30 Minutes Intravenous  Once 05/02/20 0519 05/02/20 0633   05/02/20 0530  clindamycin (CLEOCIN) IVPB 600 mg        600 mg 100 mL/hr over 30 Minutes Intravenous  Once 05/02/20 0519 05/02/20 0730   05/02/20 0530  vancomycin (VANCOREADY) IVPB 2000 mg/400 mL  Status:  Discontinued        2,000 mg 200 mL/hr over 120 Minutes Intravenous  Once 05/02/20 0528 05/04/20 1447       Assessment/Plan Covid positive Sepsis due to necrotizing fasciitis/abdominal wall abscess  - WBC 30.7>> 41.5>> 32.5>> 16.5(8/17) >> 13.5 Hyperglycemia with uncontrolled type 2 diabetes Uncontrolled hypertension Microcytic anemia Morbid obesity BMI 44.36 Candidal intertrigo, POA Hx noncompliance   Panniculitis with abdominal wall abscess, chest wall abscess x2, left mons pubis/groin abscess, hidradenitis suppurativa Panniculectomy with excision of abdominal wall abscess and panniculitis, incision and debridement of chest wall abscess x2, incision and debridement of mons pubis abscess x1,  05/02/20, Dr. Karie Soda  - POD #5 - culture STREP GROUP C, report pending - wound vac MWF to large lower abdominal wound - BID wet to dry dressing changes to bilateral chest and left groin wounds  FEN: IVF KVO/heart healthy diet  ID: Clindamycin times 05/02/20; Diflucan times 18/15/21; meropenem times 05/02/20;  maxipime/Flagyl 8/16>> day#4 DVT: Lovenox Follow-up: Dr. Karie Soda   Plan:  Continue wound vac to larger lower abdominal wound, and BID wet to dry dressing changes to the chest  wall x2 and left groin. WBC slightly up from yesterday but she is afebrile and wounds look great today, monitor. Continue IV antibiotics minimum 5 days. Follow culture.   LOS: 5 days    Franne Forts, [facility] 05/07/2020, 10:11 AM Please see Amion for pager number during day hours 7:00am-4:30pm

## 2020-05-07 NOTE — Progress Notes (Signed)
PROGRESS NOTE  Carolyn Zimmerman  DYN:[id number] DOB: 1986/04/12 DOA: 05/02/2020 PCP: System, Pcp Not In Brief Narrative: Carolyn Zimmerman is a 34 y.o. female with a history of morbid obesity, T2DM, and HTN who presented to the ED 8/15 with palpable tender abdominal wall found to have panniculitis with abdominal abscess treated with broad spectrum antibiotics and panniculectomy. SARS-CoV-2 screen found to be positive, and the patient received regeneron monoclonal antibody. Wound care with wound vac is ongoing.  Assessment & Plan: Principal Problem:   Panniuclitis with abdominal wall abscess s/p panniculectomy 05/02/2020 Active Problems:   Chest wall abscesses x 2 s/p I&D 05/02/2020   Hyperglycemia   Hx of medication noncompliance   Uncontrolled hypertension   Type 2 diabetes mellitus with hyperglycemia (HCC)   Obesity, Class III, BMI 40-49.9 (morbid obesity) (HCC)   COVID-19 virus infection   Necrotizing fasciitis (HCC)   Sepsis (HCC)   Hidradenitis suppurativa of axillae,chest wall, groins  Sepsis due to necrotizing fasciitis, panniculitis, and abscesses of abdominal wall and chest wall s/p I&D 8/15 by Dr. Michaell Cowing: Sepsis POA, resolved. Surgical culture grew group C strep and mixed anaerobes.  - Based on culture data as above, d/w ID pharmacist, we will switch to unasyn and continue to monitor clinically. WBC slightly up today of unclear significance but warranting continued monitoring.  - Per surgery: BID W > D dressing changes to chest wall and left groin, continue wound vac changes MWF (last was 8/20).   Covid-19 infection: SARS-CoV-2 PCR screening (incidentally) positive on 8/15 - Continue isolation for 10 days from positive test (or 21 days if develops severe symptoms).  - s/p regeneron 8/18.   Uncontrolled T2DM: HbA1c 10.4%.  - Levemir 25u BID, continue novolog 10u TIDWC + SSI - Needs tight control for wound healing.  - Dietitian consulted.   HTN: Uncontrolled, pain probably contributing,  but no hypotension/soft BPs noted. - Continue norvasc at increased dose of 10mg , continue HCTZ, and labetalol prn.  - Continue treating pain as above.  Iron deficiency anemia: Stable postoperative counts. Ferritin, iron, %sat wnl.  - Continue monitoring.  Candida intertrigo: s/p diflucan   Hypokalemia: Resolved with supplementation.  Morbid obesity: Estimated body mass index is 44.37 kg/m as calculated from the following:   Height as of this encounter: 5\' 7"  (1.702 m).   Weight as of this encounter: 128.5 kg.  DVT prophylaxis: Lovenox Code Status: Full Family Communication: None at bedside. Pt to relay plan of care. Disposition Plan:  Status is: Inpatient  Remains inpatient appropriate because:IV treatments appropriate due to intensity of illness or inability to take PO and Inpatient level of care appropriate due to severity of illness.    Dispo: The patient is from: Home              Anticipated d/c is to: Home              Anticipated d/c date is: > 3 days due to need for close monitoring of many wounds.              Patient currently is not medically stable to d/c.  Consultants:   General surgery  Procedures:  05/02/20 IRRIGATION AND DEBRIDEMENT ABDOMINAL and CHEST WALL NECROTIZING , MD   Antimicrobials:  Vancomycin 8/15 - 8/17  Flagyl 8/15 - 8/20  Cefepime 8/16 - 8/20  Meropenem 8/15 x1  Clindamycin 8/15 x1  Zosyn 8/15 x1  Fluconazole 8/15 x1  Unasyn 8/20 >>  Subjective: Extremely uncomfortable with wound care,  dilaudid helped toward the end and now she's drowsy and in no pain. No other complaints.   Objective: Vitals:   05/06/20 1406 05/06/20 1757 05/06/20 2111 05/07/20 0406  BP: (!) 139/97 (!) 159/94 (!) 165/91 (!) 158/99  Pulse:   79 84  Resp: 16  19 (!) 21  Temp: 98.1 F (36.7 C)  98.5 F (36.9 C) 98.7 F (37.1 C)  TempSrc: Oral  Oral Oral  SpO2: 98%  100% 99%  Weight:      Height:        Intake/Output Summary  (Last 24 hours) at 05/07/2020 1331 Last data filed at 05/07/2020 1122 Gross per 24 hour  Intake 1184 ml  Output --  Net 1184 ml   Filed Weights   05/02/20 0235 05/02/20 1645  Weight: 128.8 kg 128.5 kg   Gen: 34 y.o. female in no distress Pulm: Nonlabored breathing room air, ~12/min. Clear. CV: Regular rate and rhythm. No murmur, rub, or gallop. No JVD, no dependent edema. GI: Abdomen soft, non-tender, non-distended, with normoactive bowel sounds.  Ext: Warm, no deformities Skin: No new rashes, lesions or ulcers on visualized skin. See pictures from surgery note today for full delineation of wounds. Neuro: Drowsy but response, interactive and oriented. No focal neurological deficits. Psych: Judgement and insight appear fair. Mood euthymic & affect congruent. Behavior is appropriate.    Data Reviewed: I have personally reviewed following labs and imaging studies  CBC: Recent Labs  Lab 05/02/20 0306 05/02/20 1711 05/03/20 0310 05/04/20 0409 05/05/20 0711 05/06/20 0346 05/07/20 0316  WBC 30.7*   < > 32.5* 16.4* 13.4* 12.1* 13.5*  NEUTROABS 25.5*  --   --   --   --   --   --   HGB 11.0*   < > 10.1* 8.8* 10.0* 9.7* 9.6*  HCT 35.1*   < > 34.0* 29.5* 32.4* 32.8* 31.3*  MCV 77.8*   < > 81.3 81.3 79.2* 81.2 79.6*  PLT 345   < > 350 340 416* 393 403*   < > = values in this interval not displayed.   Basic Metabolic Panel: Recent Labs  Lab 05/02/20 0306 05/02/20 1711 05/03/20 0310 05/04/20 0409 05/05/20 0711  NA 129*  --  140 139 135  K 3.6  --  3.3* 3.4* 3.5  CL 100  --  106 106 103  CO2 18*  --  24 23 23   GLUCOSE 342*  --  107* 183* 213*  BUN 5*  --  8 8 8   CREATININE 0.79 0.63 0.70 0.60 0.56  CALCIUM 8.3*  --  8.1* 8.3* 9.0  MG  --  1.8 1.8  --   --   PHOS  --   --  2.8  --   --    GFR: Estimated Creatinine Clearance: 142.2 mL/min (by C-G formula based on SCr of 0.56 mg/dL). Liver Function Tests: Recent Labs  Lab 05/02/20 0306 05/04/20 0409 05/05/20 0711  AST 19  9* 11*  ALT 16 12 12   ALKPHOS 82 73 87  BILITOT 0.6 0.6 0.4  PROT 7.6 6.7 7.6  ALBUMIN 3.1* 2.6* 2.7*   No results for input(s): LIPASE, AMYLASE in the last 168 hours. No results for input(s): AMMONIA in the last 168 hours. Coagulation Profile: No results for input(s): INR, PROTIME in the last 168 hours. Cardiac Enzymes: No results for input(s): CKTOTAL, CKMB, CKMBINDEX, TROPONINI in the last 168 hours. BNP (last 3 results) No results for input(s): PROBNP in the last 8760  hours. HbA1C: No results for input(s): HGBA1C in the last 72 hours. CBG: Recent Labs  Lab 05/06/20 1232 05/06/20 1654 05/06/20 2112 05/07/20 0746 05/07/20 1113  GLUCAP 248* 221* 141* 308* 122*   Lipid Profile: No results for input(s): CHOL, HDL, LDLCALC, TRIG, CHOLHDL, LDLDIRECT in the last 72 hours. Thyroid Function Tests: No results for input(s): TSH, T4TOTAL, FREET4, T3FREE, THYROIDAB in the last 72 hours. Anemia Panel: Recent Labs    05/06/20 0346  FERRITIN 109  TIBC 233*  IRON 42   Urine analysis:    Component Value Date/Time   COLORURINE ORANGE (A) 05/02/2020 0422   APPEARANCEUR CLOUDY (A) 05/02/2020 0422   LABSPEC 1.010 05/02/2020 0422   PHURINE 6.5 05/02/2020 0422   GLUCOSEU >=500 (A) 05/02/2020 0422   HGBUR TRACE (A) 05/02/2020 0422   BILIRUBINUR NEGATIVE 05/02/2020 0422   KETONESUR 15 (A) 05/02/2020 0422   PROTEINUR 100 (A) 05/02/2020 0422   UROBILINOGEN 1.0 01/19/2012 0015   NITRITE NEGATIVE 05/02/2020 0422   LEUKOCYTESUR TRACE (A) 05/02/2020 0422   Recent Results (from the past 240 hour(s))  SARS Coronavirus 2 by RT PCR (hospital order, performed in [facility] hospital lab) Nasopharyngeal Nasopharyngeal Swab     Status: Abnormal   Collection Time: 05/02/20  2:58 AM   Specimen: Nasopharyngeal Swab  Result Value Ref Range Status   SARS Coronavirus 2 POSITIVE (A) NEGATIVE Final    Comment: RESULT CALLED TO, READ BACK BY AND VERIFIED WITH: MAYNARD,C AT 0407 ON [id number] BY  CHERESNOWSKY,T (NOTE) SARS-CoV-2 target nucleic acids are DETECTED  SARS-CoV-2 RNA is generally detectable in upper respiratory specimens  during the acute phase of infection.  Positive results are indicative  of the presence of the identified virus, but do not rule out bacterial infection or co-infection with other pathogens not detected by the test.  Clinical correlation with patient history and  other diagnostic information is necessary to determine patient infection status.  The expected result is negative.  Fact Sheet for Patients:   BoilerBrush.com.cy   Fact Sheet for Healthcare Providers:   https://pope.com/    This test is not yet approved or cleared by the [place] FDA and  has been authorized for detection and/or diagnosis of SARS-CoV-2 by FDA under an Emergency Use Authorization (EUA).  This EUA will remain in effect (mean ing this test can be used) for the duration of  the COVID-19 declaration under Section 564(b)(1) of the Act, 21 U.S.C. section 360-bbb-3(b)(1), unless the authorization is terminated or revoked sooner.  Performed at [facility], [address] [place], [place] [address]   Culture, blood (routine x 2)     Status: None   Collection Time: 05/02/20  5:30 AM   Specimen: BLOOD LEFT HAND  Result Value Ref Range Status   Specimen Description   Final    BLOOD LEFT HAND Performed at [facility], [address] [place], [place] [address]    Special Requests   Final    BOTTLES DRAWN AEROBIC AND ANAEROBIC Blood Culture adequate volume Performed at [facility], [address] [place], [place] [address]    Culture   Final    NO GROWTH 5 DAYS Performed at [facility], [address], [place], [place] [address]    Report Status 05/07/2020 FINAL  Final  Culture, blood (routine x 2)     Status: None   Collection Time: 05/02/20  5:40 AM   Specimen: BLOOD LEFT  WRIST  Result Value Ref Range Status   Specimen Description   Final    BLOOD LEFT WRIST Performed at [facility], [address] [place], [place] [address]    Special Requests   Final    BOTTLES DRAWN AEROBIC AND ANAEROBIC Blood Culture adequate volume Performed at [facility], [address] [place], [place] [address]    Culture   Final    NO GROWTH 5 DAYS Performed at [facility], [address] [place], [place] [address]    Report Status 05/07/2020 FINAL  Final  Aerobic/Anaerobic Culture (surgical/deep wound)     Status: Abnormal   Collection Time: 05/02/20  1:16 PM   Specimen: Wound  Result Value Ref Range Status   Specimen Description   Final    WOUND Performed at [facility], [address] [place], [place] [address]    Special Requests   Final    NONE Performed at [facility], [address] [place], [place] [address]    Gram Stain   Final    NO WBC SEEN MODERATE GRAM NEGATIVE RODS FEW GRAM POSITIVE COCCI RARE GRAM POSITIVE RODS Performed at [facility], [address] [place], [place] [address]    Culture (A)  Final    STREPTOCOCCUS GROUP C Beta hemolytic streptococci are predictably susceptible to penicillin and other beta lactams. Susceptibility testing not routinely performed. MIXED ANAEROBIC FLORA PRESENT.  CALL LAB IF FURTHER IID REQUIRED.    Report Status 05/07/2020 FINAL  Final  Culture, blood (routine x 2)     Status: None   Collection Time: 05/02/20  5:11 PM   Specimen: BLOOD  Result Value Ref Range Status   Specimen Description   Final    BLOOD Performed at [facility], [address] [place], [place] [address]    Special Requests   Final    BOTTLES DRAWN AEROBIC AND ANAEROBIC Blood Culture adequate volume Performed at [facility], [address] [place], [place] [address]    Culture   Final    NO GROWTH 5 DAYS Performed  at [facility], [address] [place], [place] [address]    Report Status 05/07/2020 FINAL  Final  Culture, blood (routine x 2)     Status: None   Collection Time: 05/02/20  5:31 PM   Specimen: BLOOD RIGHT HAND  Result Value Ref Range Status   Specimen Description   Final    BLOOD RIGHT HAND Performed at [facility], [address] [place], [place] [address]    Special Requests   Final    BOTTLES DRAWN AEROBIC AND ANAEROBIC Blood Culture adequate volume Performed at [facility], [address] [place], [place] [address]    Culture   Final    NO GROWTH 5 DAYS Performed at [facility], [address] [place], [place] [address]    Report Status 05/07/2020 FINAL  Final      Radiology Studies: No results found.  Scheduled Meds: . acetaminophen  1,000 mg Oral TID  . amLODipine  10 mg Oral Daily  . Chlorhexidine Gluconate Cloth  6 each Topical Daily  . enoxaparin (LOVENOX) injection  40 mg Subcutaneous Q24H  . feeding supplement (GLUCERNA SHAKE)  237 mL Oral BID BM  . gabapentin  400 mg Oral TID  . hydrochlorothiazide  25 mg Oral Daily  .  insulin aspart  0-20 Units Subcutaneous TID WC  . insulin aspart  10 Units Subcutaneous TID WC  . insulin detemir  25 Units Subcutaneous BID  . lip balm  1 application Topical BID  . mouth rinse  15 mL Mouth Rinse BID  . psyllium  1 packet Oral BID  . saccharomyces boulardii  250 mg Oral BID   Continuous Infusions: . sodium chloride Stopped (05/02/20 1000)  . ampicillin-sulbactam (UNASYN) IV       LOS: 5 days   Time spent: 25 minutes.  Tyrone Nine, MD [facility] [email or web address] 05/07/2020, 1:31 PM

## 2020-05-08 LAB — CBC WITH DIFFERENTIAL/PLATELET
Abs Immature Granulocytes: 0.43 10*3/uL — ABNORMAL HIGH (ref 0.00–0.07)
Basophils Absolute: 0.1 10*3/uL (ref 0.0–0.1)
Basophils Relative: 0 %
Eosinophils Absolute: 0.3 10*3/uL (ref 0.0–0.5)
Eosinophils Relative: 2 %
HCT: 32.9 % — ABNORMAL LOW (ref 36.0–46.0)
Hemoglobin: 10.2 g/dL — ABNORMAL LOW (ref 12.0–15.0)
Immature Granulocytes: 3 %
Lymphocytes Relative: 25 %
Lymphs Abs: 3.7 10*3/uL (ref 0.7–4.0)
MCH: 24.9 pg — ABNORMAL LOW (ref 26.0–34.0)
MCHC: 31 g/dL (ref 30.0–36.0)
MCV: 80.4 fL (ref 80.0–100.0)
Monocytes Absolute: 1.4 10*3/uL — ABNORMAL HIGH (ref 0.1–1.0)
Monocytes Relative: 10 %
Neutro Abs: 9.1 10*3/uL — ABNORMAL HIGH (ref 1.7–7.7)
Neutrophils Relative %: 60 %
Platelets: 472 10*3/uL — ABNORMAL HIGH (ref 150–400)
RBC: 4.09 MIL/uL (ref 3.87–5.11)
RDW: 16.5 % — ABNORMAL HIGH (ref 11.5–15.5)
WBC: 15 10*3/uL — ABNORMAL HIGH (ref 4.0–10.5)
nRBC: 0 % (ref 0.0–0.2)

## 2020-05-08 LAB — GLUCOSE, CAPILLARY
Glucose-Capillary: 283 mg/dL — ABNORMAL HIGH (ref 70–99)
Glucose-Capillary: 321 mg/dL — ABNORMAL HIGH (ref 70–99)
Glucose-Capillary: 180 mg/dL — ABNORMAL HIGH (ref 70–99)
Glucose-Capillary: 190 mg/dL — ABNORMAL HIGH (ref 70–99)

## 2020-05-08 LAB — BASIC METABOLIC PANEL
Anion gap: 9 (ref 5–15)
BUN: 8 mg/dL (ref 6–20)
CO2: 27 mmol/L (ref 22–32)
Calcium: 8.6 mg/dL — ABNORMAL LOW (ref 8.9–10.3)
Chloride: 102 mmol/L (ref 98–111)
Creatinine, Ser: 0.56 mg/dL (ref 0.44–1.00)
GFR calc Af Amer: >60 mL/min (ref >60)
GFR calc non Af Amer: >60 mL/min (ref >60)
Glucose, Bld: 238 mg/dL — ABNORMAL HIGH (ref 70–99)
Potassium: 3.8 mmol/L (ref 3.5–5.1)
Sodium: 138 mmol/L (ref 135–145)

## 2020-05-08 MED ORDER — INSULIN ASPART 100 UNIT/ML ~~LOC~~ SOLN
0.0000 [IU] | Freq: Every day | SUBCUTANEOUS | Status: DC
  Administered 2020-05-10: 2 [IU] via SUBCUTANEOUS
  Administered 2020-05-11: 3 [IU] via SUBCUTANEOUS

## 2020-05-08 MED ORDER — INSULIN DETEMIR 100 UNIT/ML ~~LOC~~ SOLN
32.0000 [IU] | Freq: Two times a day (BID) | SUBCUTANEOUS | Status: DC
  Administered 2020-05-08 – 2020-05-10 (×4): 32 [IU] via SUBCUTANEOUS
  Filled 2020-05-08 (×4): qty 0.32

## 2020-05-08 MED ORDER — HYDROCHLOROTHIAZIDE 25 MG PO TABS
50.0000 mg | ORAL_TABLET | Freq: Every day | ORAL | Status: DC
  Administered 2020-05-08 – 2020-05-12 (×5): 50 mg via ORAL
  Filled 2020-05-08 (×5): qty 2

## 2020-05-08 NOTE — Progress Notes (Signed)
PROGRESS NOTE  Carolyn Zimmerman  QQP:[id number] DOB: Aug 29, 1986 DOA: 05/02/2020 PCP: System, Pcp Not In Brief Narrative: Carolyn Zimmerman is a 34 y.o. female with a history of morbid obesity, T2DM, and HTN who presented to the ED 8/15 with palpable tender abdominal wall found to have panniculitis with abdominal abscess treated with broad spectrum antibiotics and panniculectomy. SARS-CoV-2 screen found to be positive, and the patient received regeneron monoclonal antibody. Wound care with wound vac is ongoing.  Assessment & Plan: Principal Problem:   Panniuclitis with abdominal wall abscess s/p panniculectomy 05/02/2020 Active Problems:   Chest wall abscesses x 2 s/p I&D 05/02/2020   Hyperglycemia   Hx of medication noncompliance   Uncontrolled hypertension   Type 2 diabetes mellitus with hyperglycemia (HCC)   Obesity, Class III, BMI 40-49.9 (morbid obesity) (HCC)   COVID-19 virus infection   Necrotizing fasciitis (HCC)   Sepsis (HCC)   Hidradenitis suppurativa of axillae,chest wall, groins  Sepsis due to necrotizing fasciitis, panniculitis, and abscesses of abdominal wall and chest wall s/p I&D 8/15 by Dr. Michaell Cowing: Sepsis POA, resolved. Surgical culture grew group C strep and mixed anaerobes.  - Based on culture data as above, d/w ID pharmacist, we will switch to unasyn and continue to monitor clinically. WBC up again today of unclear significance but warranting continued monitoring.  - Per surgery: BID W > D dressing changes to chest wall and left groin, continue wound vac changes MWF (last was 8/20).  - Analgesics as ordered.  Covid-19 infection: SARS-CoV-2 PCR screening (incidentally) positive on 8/15 - Continue isolation for 10 days from positive test (or 21 days if develops severe symptoms).  - s/p regeneron 8/18.   Uncontrolled T2DM: HbA1c 10.4%.  - Levemir increase to 32u BID, continue novolog 10u TIDWC + SSI, add HS coverage. - Needs tight control for wound healing.  - Dietitian  consulted, needs to avoid large indiscretions.  HTN: Uncontrolled, pain probably contributing, but no hypotension/soft BPs noted. - Continue norvasc at increased dose of 10mg , continue HCTZ, increase dose to 50mg , and continue labetalol prn.  - Continue treating pain as above.  Iron deficiency anemia: Stable postoperative counts. Ferritin, iron, %sat wnl.  - Continue monitoring.  Candida intertrigo: s/p diflucan   Hypokalemia: Resolved with supplementation.  Morbid obesity: Estimated body mass index is 44.37 kg/m as calculated from the following:   Height as of this encounter: 5\' 7"  (1.702 m).   Weight as of this encounter: 128.5 kg.  DVT prophylaxis: Lovenox Code Status: Full Family Communication: None at bedside. Pt to relay plan of care. Disposition Plan:  Status is: Inpatient  Remains inpatient appropriate because:IV treatments appropriate due to intensity of illness or inability to take PO and Inpatient level of care appropriate due to severity of illness.    Dispo: The patient is from: Home              Anticipated d/c is to: Home              Anticipated d/c date is: > 3 days due to need for close monitoring of many wounds.              Patient currently is not medically stable to d/c.  Consultants:   General surgery  Procedures:  05/02/20 IRRIGATION AND DEBRIDEMENT ABDOMINAL and CHEST WALL NECROTIZING , MD   Antimicrobials:  Vancomycin 8/15 - 8/17  Flagyl 8/15 - 8/20  Cefepime 8/16 - 8/20  Meropenem 8/15 x1  Clindamycin 8/15 x1  Zosyn 8/15 x1  Fluconazole 8/15 x1  Unasyn 8/20 >>  Subjective: Having severe waxing/waning pain in right lower abdomen that is constant, worse with touching/movements, better with medications as ordered. No fevers, chills, shortness of breath, dysuria. Having abnormal sensation on anterior right thigh below wound which has been that way for days.  Objective: Vitals:   05/07/20 2134 05/08/20 0430  05/08/20 0439 05/08/20 1340  BP: (!) 142/99 (!) 160/88 (!) 151/98 (!) 151/92  Pulse: 88  91 87  Resp: 20  20 14   Temp: 98.3 F (36.8 C) (!) 97.5 F (36.4 C) 98.4 F (36.9 C)   TempSrc: Oral Oral Oral   SpO2: 99%  98% 100%  Weight:      Height:        Intake/Output Summary (Last 24 hours) at 05/08/2020 1442 Last data filed at 05/08/2020 1429 Gross per 24 hour  Intake 3157.6 ml  Output --  Net 3157.6 ml   Filed Weights   05/02/20 0235 05/02/20 1645  Weight: 128.8 kg 128.5 kg   Gen: 34 y.o. female in no distress Pulm: Nonlabored breathing room air. Clear. CV: Regular rate and rhythm. No murmur, rub, or gallop. No JVD, no dependent edema. GI: Abdomen soft, non-tender other than near wounds, non-distended, with normoactive bowel sounds.  Ext: Warm, no deformities Skin: Tender right lower abdominal wound with some induration, no fluctuance, very tender, warm, no spreading erythema. No new rashes, lesions or ulcers on visualized skin. Neuro: Alert and oriented. No focal neurological deficits. Sensation is intact by exam to entire RLE, reported dysesthesia in superior, anterior right thigh without palpable or visible abnormality. Psych: Judgement and insight appear fair. Mood euthymic & affect congruent. Behavior is appropriate.    Data Reviewed: I have personally reviewed following labs and imaging studies  CBC: Recent Labs  Lab 05/02/20 0306 05/02/20 1711 05/04/20 0409 05/05/20 0711 05/06/20 0346 05/07/20 0316 05/08/20 0407  WBC 30.7*   < > 16.4* 13.4* 12.1* 13.5* 15.0*  NEUTROABS 25.5*  --   --   --   --   --  9.1*  HGB 11.0*   < > 8.8* 10.0* 9.7* 9.6* 10.2*  HCT 35.1*   < > 29.5* 32.4* 32.8* 31.3* 32.9*  MCV 77.8*   < > 81.3 79.2* 81.2 79.6* 80.4  PLT 345   < > 340 416* 393 403* 472*   < > = values in this interval not displayed.   Basic Metabolic Panel: Recent Labs  Lab 05/02/20 0306 05/02/20 0306 05/02/20 1711 05/03/20 0310 05/04/20 0409 05/05/20 0711  05/08/20 0407  NA 129*  --   --  140 139 135 138  K 3.6  --   --  3.3* 3.4* 3.5 3.8  CL 100  --   --  106 106 103 102  CO2 18*  --   --  24 23 23 27   GLUCOSE 342*  --   --  107* 183* 213* 238*  BUN 5*  --   --  8 8 8 8   CREATININE 0.79   < > 0.63 0.70 0.60 0.56 0.56  CALCIUM 8.3*  --   --  8.1* 8.3* 9.0 8.6*  MG  --   --  1.8 1.8  --   --   --   PHOS  --   --   --  2.8  --   --   --    < > = values in this interval not displayed.   GFR:  Estimated Creatinine Clearance: 142.2 mL/min (by C-G formula based on SCr of 0.56 mg/dL). Liver Function Tests: Recent Labs  Lab 05/02/20 0306 05/04/20 0409 05/05/20 0711  AST 19 9* 11*  ALT 16 12 12   ALKPHOS 82 73 87  BILITOT 0.6 0.6 0.4  PROT 7.6 6.7 7.6  ALBUMIN 3.1* 2.6* 2.7*   No results for input(s): LIPASE, AMYLASE in the last 168 hours. No results for input(s): AMMONIA in the last 168 hours. Coagulation Profile: No results for input(s): INR, PROTIME in the last 168 hours. Cardiac Enzymes: No results for input(s): CKTOTAL, CKMB, CKMBINDEX, TROPONINI in the last 168 hours. BNP (last 3 results) No results for input(s): PROBNP in the last 8760 hours. HbA1C: No results for input(s): HGBA1C in the last 72 hours. CBG: Recent Labs  Lab 05/07/20 1113 05/07/20 1701 05/07/20 2127 05/08/20 0823 05/08/20 1120  GLUCAP 122* 278* 184* 283* 321*   Lipid Profile: No results for input(s): CHOL, HDL, LDLCALC, TRIG, CHOLHDL, LDLDIRECT in the last 72 hours. Thyroid Function Tests: No results for input(s): TSH, T4TOTAL, FREET4, T3FREE, THYROIDAB in the last 72 hours. Anemia Panel: Recent Labs    05/06/20 0346  FERRITIN 109  TIBC 233*  IRON 42   Urine analysis:    Component Value Date/Time   COLORURINE ORANGE (A) 05/02/2020 0422   APPEARANCEUR CLOUDY (A) 05/02/2020 0422   LABSPEC 1.010 05/02/2020 0422   PHURINE 6.5 05/02/2020 0422   GLUCOSEU >=500 (A) 05/02/2020 0422   HGBUR TRACE (A) 05/02/2020 0422   BILIRUBINUR NEGATIVE  05/02/2020 0422   KETONESUR 15 (A) 05/02/2020 0422   PROTEINUR 100 (A) 05/02/2020 0422   UROBILINOGEN 1.0 01/19/2012 0015   NITRITE NEGATIVE 05/02/2020 0422   LEUKOCYTESUR TRACE (A) 05/02/2020 0422   Recent Results (from the past 240 hour(s))  SARS Coronavirus 2 by RT PCR (hospital order, performed in [facility] hospital lab) Nasopharyngeal Nasopharyngeal Swab     Status: Abnormal   Collection Time: 05/02/20  2:58 AM   Specimen: Nasopharyngeal Swab  Result Value Ref Range Status   SARS Coronavirus 2 POSITIVE (A) NEGATIVE Final    Comment: RESULT CALLED TO, READ BACK BY AND VERIFIED WITH: MAYNARD,C AT 0407 ON 05/04/20 BY CHERESNOWSKY,T (NOTE) SARS-CoV-2 target nucleic acids are DETECTED  SARS-CoV-2 RNA is generally detectable in upper respiratory specimens  during the acute phase of infection.  Positive results are indicative  of the presence of the identified virus, but do not rule out bacterial infection or co-infection with other pathogens not detected by the test.  Clinical correlation with patient history and  other diagnostic information is necessary to determine patient infection status.  The expected result is negative.  Fact Sheet for Patients:   510258   Fact Sheet for Healthcare Providers:   BoilerBrush.com.cy    This test is not yet approved or cleared by the https://pope.com/ FDA and  has been authorized for detection and/or diagnosis of SARS-CoV-2 by FDA under an Emergency Use Authorization (EUA).  This EUA will remain in effect (mean ing this test can be used) for the duration of  the COVID-19 declaration under Section 564(b)(1) of the Act, 21 U.S.C. section 360-bbb-3(b)(1), unless the authorization is terminated or revoked sooner.  Performed at [facility], [address] [place], [place]   Culture, blood (routine x 2)     Status: None   Collection Time: 05/02/20  5:30 AM    Specimen: BLOOD LEFT HAND  Result Value Ref Range Status  Specimen Description   Final    BLOOD LEFT HAND Performed at [facility], [address] [place], [place] [address]    Special Requests   Final    BOTTLES DRAWN AEROBIC AND ANAEROBIC Blood Culture adequate volume Performed at [facility], [address] [place], [place] [address]    Culture   Final    NO GROWTH 5 DAYS Performed at [facility], [address], [place], [place] [address]    Report Status 05/07/2020 FINAL  Final  Culture, blood (routine x 2)     Status: None   Collection Time: 05/02/20  5:40 AM   Specimen: BLOOD LEFT WRIST  Result Value Ref Range Status   Specimen Description   Final    BLOOD LEFT WRIST Performed at [facility], [address] [place], [place] [address]    Special Requests   Final    BOTTLES DRAWN AEROBIC AND ANAEROBIC Blood Culture adequate volume Performed at [facility], [address] [place], [place] [address]    Culture   Final    NO GROWTH 5 DAYS Performed at [facility], [address], [place], [place] [address]    Report Status 05/07/2020 FINAL  Final  Aerobic/Anaerobic Culture (surgical/deep wound)     Status: Abnormal   Collection Time: 05/02/20  1:16 PM   Specimen: Wound  Result Value Ref Range Status   Specimen Description   Final    WOUND Performed at [facility], [address], [place], [place] [address]    Special Requests   Final    NONE Performed at [facility], [address], [place], [place] [address]    Gram Stain   Final    NO WBC SEEN MODERATE GRAM NEGATIVE RODS FEW GRAM POSITIVE COCCI RARE GRAM POSITIVE RODS Performed at [facility], [address] [place], [place] [address]    Culture (A)  Final    STREPTOCOCCUS GROUP C Beta hemolytic streptococci are predictably susceptible to penicillin and other beta lactams. Susceptibility testing  not routinely performed. MIXED ANAEROBIC FLORA PRESENT.  CALL LAB IF FURTHER IID REQUIRED.    Report Status 05/07/2020 FINAL  Final  Culture, blood (routine x 2)     Status: None   Collection Time: 05/02/20  5:11 PM   Specimen: BLOOD  Result Value Ref Range Status   Specimen Description   Final    BLOOD Performed at [facility], [address] [place], [place] [address]    Special Requests   Final    BOTTLES DRAWN AEROBIC AND ANAEROBIC Blood Culture adequate volume Performed at [facility], [address] [place], [place] [address]    Culture   Final    NO GROWTH 5 DAYS Performed at [facility], [address] [place], [place] [address]    Report Status 05/07/2020 FINAL  Final  Culture, blood (routine x 2)     Status: None   Collection Time: 05/02/20  5:31 PM   Specimen: BLOOD RIGHT HAND  Result Value Ref Range Status   Specimen Description   Final    BLOOD RIGHT HAND Performed at [facility], [address], [place], [place] [address]    Special Requests   Final    BOTTLES DRAWN AEROBIC AND ANAEROBIC Blood Culture adequate volume Performed at [facility], [address] [place], [place] [address]    Culture   Final    NO GROWTH 5 DAYS Performed at [facility], [address], [place], [place] [address]    Report Status 05/07/2020 FINAL  Final      Radiology Studies: No results found.  Scheduled Meds: . acetaminophen  1,000 mg Oral TID  . amLODipine  10 mg Oral Daily  . Chlorhexidine Gluconate Cloth  6 each Topical Daily  . enoxaparin (LOVENOX) injection  40 mg Subcutaneous Q24H  . feeding supplement (GLUCERNA SHAKE)  237 mL Oral BID BM  . gabapentin  400 mg Oral TID  . hydrochlorothiazide  50 mg Oral Daily  . insulin aspart  0-20 Units Subcutaneous TID WC  . insulin aspart  0-5 Units Subcutaneous QHS  . insulin aspart  10 Units Subcutaneous TID WC  . insulin detemir   32 Units Subcutaneous BID  . lip balm  1 application Topical BID  . mouth rinse  15 mL Mouth Rinse BID  . psyllium  1 packet Oral BID  . saccharomyces boulardii  250 mg Oral BID   Continuous Infusions: . sodium chloride Stopped (05/02/20 1000)  . ampicillin-sulbactam (UNASYN) IV 3 g (05/08/20 1337)     LOS: 6 days   Time spent: 35 minutes.  Tyrone Nine, MD [facility] [email or web address] 05/08/2020, 2:42 PM

## 2020-05-08 NOTE — Progress Notes (Signed)
Inpatient Diabetes Program Recommendations  AACE/ADA: New Consensus Statement on Inpatient Glycemic Control (2015)  Target Ranges:  Prepandial:   less than 140 mg/dL      Peak postprandial:   less than 180 mg/dL (1-2 hours)      Critically ill patients:  140 - 180 mg/dL   Lab Results  Component Value Date   GLUCAP 283 (H) 05/08/2020   HGBA1C 10.4 (H) 05/02/2020    Results for Carolyn Zimmerman, Carolyn Zimmerman (MRN [id number]) as of 05/08/2020 09:30  Ref. Range 05/07/2020 07:46 05/07/2020 11:13 05/07/2020 17:01 05/07/2020 21:27 05/08/2020 08:23  Glucose-Capillary Latest Ref Range: 70 - 99 mg/dL 762 (H) 263 (H) 335 (H) 184 (H) 283 (H)  Review of Glycemic Control  Diabetes history: DM 2 Outpatient Diabetes medications: Metformin 500 mg tid (70/30 vial ans syringe before she lost insurance) Current orders for Inpatient glycemic control:  Levemir 25 units bid Novolog 0-20 units tid Novolog 10 units tid  Inpatient Diabetes Program Recommendations:    -  Increase Levemir to 32 units bid.    Pt has meter and supplies at home.   -  Lantus solostar insulin pen order # [id number] -  Insulin pen needles order # [id number]  Thanks,  Christena Deem RN, MSN, BC-ADM Inpatient Diabetes Coordinator Team Pager [phone] (8a-5p)

## 2020-05-09 LAB — CBC WITH DIFFERENTIAL/PLATELET
Abs Immature Granulocytes: 0.65 10*3/uL — ABNORMAL HIGH (ref 0.00–0.07)
Basophils Absolute: 0.1 10*3/uL (ref 0.0–0.1)
Basophils Relative: 1 %
Eosinophils Absolute: 0.3 10*3/uL (ref 0.0–0.5)
Eosinophils Relative: 2 %
HCT: 32.3 % — ABNORMAL LOW (ref 36.0–46.0)
Hemoglobin: 10 g/dL — ABNORMAL LOW (ref 12.0–15.0)
Immature Granulocytes: 4 %
Lymphocytes Relative: 27 %
Lymphs Abs: 4 10*3/uL (ref 0.7–4.0)
MCH: 24.8 pg — ABNORMAL LOW (ref 26.0–34.0)
MCHC: 31 g/dL (ref 30.0–36.0)
MCV: 80.1 fL (ref 80.0–100.0)
Monocytes Absolute: 1.4 10*3/uL — ABNORMAL HIGH (ref 0.1–1.0)
Monocytes Relative: 9 %
Neutro Abs: 8.3 10*3/uL — ABNORMAL HIGH (ref 1.7–7.7)
Neutrophils Relative %: 57 %
Platelets: 449 10*3/uL — ABNORMAL HIGH (ref 150–400)
RBC: 4.03 MIL/uL (ref 3.87–5.11)
RDW: 16.6 % — ABNORMAL HIGH (ref 11.5–15.5)
WBC: 14.7 10*3/uL — ABNORMAL HIGH (ref 4.0–10.5)
nRBC: 0 % (ref 0.0–0.2)

## 2020-05-09 LAB — GLUCOSE, CAPILLARY
Glucose-Capillary: 225 mg/dL — ABNORMAL HIGH (ref 70–99)
Glucose-Capillary: 248 mg/dL — ABNORMAL HIGH (ref 70–99)
Glucose-Capillary: 295 mg/dL — ABNORMAL HIGH (ref 70–99)
Glucose-Capillary: 181 mg/dL — ABNORMAL HIGH (ref 70–99)

## 2020-05-09 LAB — CREATININE, SERUM
Creatinine, Ser: 0.69 mg/dL (ref 0.44–1.00)
GFR calc Af Amer: >60 mL/min (ref >60)
GFR calc non Af Amer: >60 mL/min (ref >60)

## 2020-05-09 NOTE — Progress Notes (Signed)
Wound care provided to chest wall & L groin I&D sites.

## 2020-05-09 NOTE — Progress Notes (Signed)
PROGRESS NOTE  Carolyn Zimmerman  ZOX:[id number] DOB: 1986/07/14 DOA: 05/02/2020 PCP: System, Pcp Not In Brief Narrative: Carolyn Zimmerman is a 34 y.o. female with a history of morbid obesity, T2DM, and HTN who presented to the ED 8/15 with palpable tender abdominal wall found to have panniculitis with abdominal abscess treated with broad spectrum antibiotics and panniculectomy. SARS-CoV-2 screen found to be positive, and the patient received regeneron monoclonal antibody. Wound care with wound vac is ongoing.  Assessment & Plan: Principal Problem:   Panniuclitis with abdominal wall abscess s/p panniculectomy 05/02/2020 Active Problems:   Chest wall abscesses x 2 s/p I&D 05/02/2020   Hyperglycemia   Hx of medication noncompliance   Uncontrolled hypertension   Type 2 diabetes mellitus with hyperglycemia (HCC)   Obesity, Class III, BMI 40-49.9 (morbid obesity) (HCC)   COVID-19 virus infection   Necrotizing fasciitis (HCC)   Sepsis (HCC)   Hidradenitis suppurativa of axillae,chest wall, groins  Sepsis due to necrotizing fasciitis, panniculitis, and abscesses of abdominal wall and chest wall s/p I&D 8/15 by Dr. Michaell Cowing: Sepsis POA, resolved. Surgical culture grew group C strep and mixed anaerobes.  - Based on culture data as above, continue unasyn and continue to monitor clinically. WBC not normalizing, remains afebrile, warranting continued monitoring.  - Per surgery: BID W > D dressing changes to chest wall and left groin, continue wound vac changes MWF (last was 8/20).  - Analgesics as ordered.  Covid-19 infection: SARS-CoV-2 PCR screening (incidentally) positive on 8/15 - Continue isolation for 10 days from positive test (or 21 days if develops severe symptoms).  - s/p regeneron 8/18.   Uncontrolled T2DM: HbA1c 10.4%.  - Levemir increased to 32u BID, continue novolog 10u TIDWC + SSI, add HS coverage. Control is better. Will continue monitoring closely - Needs tight control for wound healing.  -  Dietitian consulted, needs to avoid large indiscretions.  HTN: Pain probably contributing, but no hypotension/soft BPs noted. - Continue norvasc at increased dose of , continue HCTZ, increase dose to , and continue labetalol prn.  - Continue treating pain as above.  Iron deficiency anemia: Stable postoperative counts. Ferritin, iron, %sat wnl.  - Continue monitoring.  Candida intertrigo: s/p diflucan   Hypokalemia: Resolved with supplementation.  Morbid obesity: Estimated body mass index is 44.37 kg/m as calculated from the following:   Height as of this encounter:  (1.702 m).   Weight as of this encounter: 128.5 kg.  DVT prophylaxis: Lovenox Code Status: Full Family Communication: None at bedside. Pt to relay plan of care. Disposition Plan:  Status is: Inpatient  Remains inpatient appropriate because:IV treatments appropriate due to intensity of illness or inability to take PO and Inpatient level of care appropriate due to severity of illness.    Dispo: The patient is from: Home              Anticipated d/c is to: Home              Anticipated d/c date is: > 3 days due to need for close monitoring of many wounds.              Patient currently is not medically stable to d/c.  Consultants:   General surgery  Procedures:  05/02/20 IRRIGATION AND DEBRIDEMENT ABDOMINAL and CHEST WALL NECROTIZING Dala Dock, MD   Antimicrobials:  Vancomycin 8/15 - 8/17  Flagyl 8/15 - 8/20  Cefepime 8/16 - 8/20  Meropenem 8/15 x1  Clindamycin 8/15 x1  Zosyn 8/15 x1  Fluconazole 8/15 x1  Unasyn 8/20 >>  Subjective: No major events. Pain in lower abdomen is moderate, improved with pain medications as provided, and not overall changed over past few days. No fever, cough, (has resolved) or shortness of breath. Tolerating dressing changes.   Objective: Vitals:   05/08/20 0439 05/08/20 1340 05/08/20 2026 05/09/20 0524  BP: (!) 151/98 (!) 151/92 138/72 (!)  152/83  Pulse: 91 87 90 85  Resp: 20 14 16 20   Temp: 98.4 F (36.9 C)  98.9 F (37.2 C) 98.6 F (37 C)  TempSrc: Oral  Oral Oral  SpO2: 98% 100% 100% 99%  Weight:      Height:        Intake/Output Summary (Last 24 hours) at 05/09/2020 1128 Last data filed at 05/09/2020 0957 Gross per 24 hour  Intake 960 ml  Output --  Net 960 ml   Filed Weights   05/02/20 0235 05/02/20 1645  Weight: 128.8 kg 128.5 kg   Gen: 34 y.o. female in no distress Pulm: Nonlabored breathing room air. Clear. CV: Regular rate and rhythm. No murmur, rub, or gallop. No JVD, no dependent edema. GI: Abdomen soft, non-tender, non-distended, with normoactive bowel sounds.  Ext: Warm, no deformities Skin: No new rashes, lesions or ulcers on visualized skin. Dressing in L groin, bilateral chest c/d/i, wound vac in place, surrounding tenderness without fluctuance or spreading erythema. Neuro: Alert and oriented. No focal neurological deficits. Psych: Judgement and insight appear fair. Mood euthymic & affect congruent. Behavior is appropriate.    Data Reviewed: I have personally reviewed following labs and imaging studies  CBC: Recent Labs  Lab 05/05/20 0711 05/06/20 0346 05/07/20 0316 05/08/20 0407 05/09/20 0340  WBC 13.4* 12.1* 13.5* 15.0* 14.7*  NEUTROABS  --   --   --  9.1* 8.3*  HGB 10.0* 9.7* 9.6* 10.2* 10.0*  HCT 32.4* 32.8* 31.3* 32.9* 32.3*  MCV 79.2* 81.2 79.6* 80.4 80.1  PLT 416* 393 403* 472* 449*   Basic Metabolic Panel: Recent Labs  Lab 05/02/20 1711 05/02/20 1711 05/03/20 0310 05/04/20 0409 05/05/20 0711 05/08/20 0407 05/09/20 0340  NA  --   --  140 139 135 138  --   K  --   --  3.3* 3.4* 3.5 3.8  --   CL  --   --  106 106 103 102  --   CO2  --   --  24 23 23 27   --   GLUCOSE  --   --  107* 183* 213* 238*  --   BUN  --   --  8 8 8 8   --   CREATININE 0.63   < > 0.70 0.60 0.56 0.56 0.69  CALCIUM  --   --  8.1* 8.3* 9.0 8.6*  --   MG 1.8  --  1.8  --   --   --   --   PHOS  --    --  2.8  --   --   --   --    < > = values in this interval not displayed.   GFR: Estimated Creatinine Clearance: 142.2 mL/min (by C-G formula based on SCr of 0.69 mg/dL). Liver Function Tests: Recent Labs  Lab 05/04/20 0409 05/05/20 0711  AST 9* 11*  ALT 12 12  ALKPHOS 73 87  BILITOT 0.6 0.4  PROT 6.7 7.6  ALBUMIN 2.6* 2.7*   No results for input(s): LIPASE, AMYLASE in the last 168 hours. No results for input(s):  AMMONIA in the last 168 hours. Coagulation Profile: No results for input(s): INR, PROTIME in the last 168 hours. Cardiac Enzymes: No results for input(s): CKTOTAL, CKMB, CKMBINDEX, TROPONINI in the last 168 hours. BNP (last 3 results) No results for input(s): PROBNP in the last 8760 hours. HbA1C: No results for input(s): HGBA1C in the last 72 hours. CBG: Recent Labs  Lab 05/08/20 0823 05/08/20 1120 05/08/20 1633 05/08/20 2028 05/09/20 0816  GLUCAP 283* 321* 180* 190* 225*   Lipid Profile: No results for input(s): CHOL, HDL, LDLCALC, TRIG, CHOLHDL, LDLDIRECT in the last 72 hours. Thyroid Function Tests: No results for input(s): TSH, T4TOTAL, FREET4, T3FREE, THYROIDAB in the last 72 hours. Anemia Panel: No results for input(s): VITAMINB12, FOLATE, FERRITIN, TIBC, IRON, RETICCTPCT in the last 72 hours. Urine analysis:    Component Value Date/Time   COLORURINE ORANGE (A) 05/02/2020 0422   APPEARANCEUR CLOUDY (A) 05/02/2020 0422   LABSPEC 1.010 05/02/2020 0422   PHURINE 6.5 05/02/2020 0422   GLUCOSEU >=500 (A) 05/02/2020 0422   HGBUR TRACE (A) 05/02/2020 0422   BILIRUBINUR NEGATIVE 05/02/2020 0422   KETONESUR 15 (A) 05/02/2020 0422   PROTEINUR 100 (A) 05/02/2020 0422   UROBILINOGEN 1.0 01/19/2012 0015   NITRITE NEGATIVE 05/02/2020 0422   LEUKOCYTESUR TRACE (A) 05/02/2020 0422   Recent Results (from the past 240 hour(s))  SARS Coronavirus 2 by RT PCR (hospital order, performed in [facility]) Nasopharyngeal Nasopharyngeal Swab     Status:  Abnormal   Collection Time: 05/02/20  2:58 AM   Specimen: Nasopharyngeal Swab  Result Value Ref Range Status   SARS Coronavirus 2 POSITIVE (A) NEGATIVE Final    Comment: RESULT CALLED TO, READ BACK BY AND VERIFIED WITH: MAYNARD,C AT 0407 ON [id number] BY CHERESNOWSKY,T (NOTE) SARS-CoV-2 target nucleic acids are DETECTED  SARS-CoV-2 RNA is generally detectable in upper respiratory specimens  during the acute phase of infection.  Positive results are indicative  of the presence of the identified virus, but do not rule out bacterial infection or co-infection with other pathogens not detected by the test.  Clinical correlation with patient history and  other diagnostic information is necessary to determine patient infection status.  The expected result is negative.  Fact Sheet for Patients:   BoilerBrush.com.cyhttps://www.fda.gov/media/136312/download   Fact Sheet for Healthcare Providers:   https://pope.com/https://www.fda.gov/media/136313/download    This test is not yet approved or cleared by the [place] FDA and  has been authorized for detection and/or diagnosis of SARS-CoV-2 by FDA under an Emergency Use Authorization (EUA).  This EUA will remain in effect (mean ing this test can be used) for the duration of  the COVID-19 declaration under Section 564(b)(1) of the Act, 21 U.S.C. section 360-bbb-3(b)(1), unless the authorization is terminated or revoked sooner.  Performed at [facility] [place], [address] [place], [place] [address]   Culture, blood (routine x 2)     Status: None   Collection Time: 05/02/20  5:30 AM   Specimen: BLOOD LEFT HAND  Result Value Ref Range Status   Specimen Description   Final    BLOOD LEFT HAND Performed at [facility] [place], [address] [place], [place] [address]    Special Requests   Final    BOTTLES DRAWN AEROBIC AND ANAEROBIC Blood Culture adequate volume Performed at [facility] [place], [address] [place], [place] [address]    Culture    Final    NO GROWTH 5 DAYS Performed at  [facility], [address] [place], [place] [address]    Report Status 05/07/2020 FINAL  Final  Culture, blood (routine x 2)     Status: None   Collection Time: 05/02/20  5:40 AM   Specimen: BLOOD LEFT WRIST  Result Value Ref Range Status   Specimen Description   Final    BLOOD LEFT WRIST Performed at [facility], [address] [place], [place] [address]    Special Requests   Final    BOTTLES DRAWN AEROBIC AND ANAEROBIC Blood Culture adequate volume Performed at [facility], [address] [place], [place] [address]    Culture   Final    NO GROWTH 5 DAYS Performed at [facility], [address], [place], [place] [address]    Report Status 05/07/2020 FINAL  Final  Aerobic/Anaerobic Culture (surgical/deep wound)     Status: Abnormal   Collection Time: 05/02/20  1:16 PM   Specimen: Wound  Result Value Ref Range Status   Specimen Description   Final    WOUND Performed at [facility], [address] [place], [place] [address]    Special Requests   Final    NONE Performed at [facility], [address], [place], [place] [address]    Gram Stain   Final    NO WBC SEEN MODERATE GRAM NEGATIVE RODS FEW GRAM POSITIVE COCCI RARE GRAM POSITIVE RODS Performed at [facility], [address], [place], [place] [address]    Culture (A)  Final    STREPTOCOCCUS GROUP C Beta hemolytic streptococci are predictably susceptible to penicillin and other beta lactams. Susceptibility testing not routinely performed. MIXED ANAEROBIC FLORA PRESENT.  CALL LAB IF FURTHER IID REQUIRED.    Report Status 05/07/2020 FINAL  Final  Culture, blood (routine x 2)     Status: None   Collection Time: 05/02/20  5:11 PM   Specimen: BLOOD  Result Value Ref Range Status   Specimen Description   Final    BLOOD Performed at [facility], [address]  [place], [place] [address]    Special Requests   Final    BOTTLES DRAWN AEROBIC AND ANAEROBIC Blood Culture adequate volume Performed at [facility], [address], [place], [place] [address]    Culture   Final    NO GROWTH 5 DAYS Performed at [facility], [address], [place], [place] [address]    Report Status 05/07/2020 FINAL  Final  Culture, blood (routine x 2)     Status: None   Collection Time: 05/02/20  5:31 PM   Specimen: BLOOD RIGHT HAND  Result Value Ref Range Status   Specimen Description   Final    BLOOD RIGHT HAND Performed at [facility], [address] [place], [place] [address]    Special Requests   Final    BOTTLES DRAWN AEROBIC AND ANAEROBIC Blood Culture adequate volume Performed at [facility], [address], [place], [place] [address]    Culture   Final    NO GROWTH 5 DAYS Performed at [facility], [address] [place] [address] [place], [place] [address]    Report Status 05/07/2020 FINAL  Final      Radiology Studies: No results found.  Scheduled Meds: . acetaminophen  1,000 mg Oral TID  . amLODipine  10 mg Oral Daily  . Chlorhexidine Gluconate Cloth  6 each Topical Daily  . enoxaparin (LOVENOX) injection  40 mg Subcutaneous Q24H  . feeding supplement (GLUCERNA SHAKE)  237 mL Oral BID BM  . gabapentin  400 mg Oral TID  . hydrochlorothiazide  50 mg Oral Daily  . insulin aspart  0-20 Units Subcutaneous TID WC  . insulin aspart  0-5 Units Subcutaneous QHS  . insulin aspart  10 Units Subcutaneous TID WC  . insulin detemir  32 Units Subcutaneous BID  . lip balm  1 application Topical BID  . mouth rinse  15 mL Mouth Rinse BID  . psyllium  1 packet Oral BID  . saccharomyces boulardii  250 mg Oral BID   Continuous Infusions: . sodium chloride Stopped (05/02/20 1000)  . ampicillin-sulbactam (UNASYN) IV 3 g (05/09/20 0903)     LOS: 7 days   Time spent: 25 minutes.  Tyrone Nine, MD [facility] [email or web address] 05/09/2020, 11:28 AM

## 2020-05-10 LAB — CBC WITH DIFFERENTIAL/PLATELET
Abs Immature Granulocytes: 0.6 10*3/uL — ABNORMAL HIGH (ref 0.00–0.07)
Basophils Absolute: 0.1 10*3/uL (ref 0.0–0.1)
Basophils Relative: 1 %
Eosinophils Absolute: 0.3 10*3/uL (ref 0.0–0.5)
Eosinophils Relative: 2 %
HCT: 34.3 % — ABNORMAL LOW (ref 36.0–46.0)
Hemoglobin: 10.3 g/dL — ABNORMAL LOW (ref 12.0–15.0)
Immature Granulocytes: 4 %
Lymphocytes Relative: 27 %
Lymphs Abs: 4 10*3/uL (ref 0.7–4.0)
MCH: 24.3 pg — ABNORMAL LOW (ref 26.0–34.0)
MCHC: 30 g/dL (ref 30.0–36.0)
MCV: 80.9 fL (ref 80.0–100.0)
Monocytes Absolute: 1.2 10*3/uL — ABNORMAL HIGH (ref 0.1–1.0)
Monocytes Relative: 8 %
Neutro Abs: 8.9 10*3/uL — ABNORMAL HIGH (ref 1.7–7.7)
Neutrophils Relative %: 58 %
Platelets: 483 10*3/uL — ABNORMAL HIGH (ref 150–400)
RBC: 4.24 MIL/uL (ref 3.87–5.11)
RDW: 16.5 % — ABNORMAL HIGH (ref 11.5–15.5)
WBC: 15.1 10*3/uL — ABNORMAL HIGH (ref 4.0–10.5)
nRBC: 0 % (ref 0.0–0.2)

## 2020-05-10 LAB — GLUCOSE, CAPILLARY
Glucose-Capillary: 276 mg/dL — ABNORMAL HIGH (ref 70–99)
Glucose-Capillary: 223 mg/dL — ABNORMAL HIGH (ref 70–99)
Glucose-Capillary: 246 mg/dL — ABNORMAL HIGH (ref 70–99)
Glucose-Capillary: 236 mg/dL — ABNORMAL HIGH (ref 70–99)

## 2020-05-10 MED ORDER — INSULIN DETEMIR 100 UNIT/ML ~~LOC~~ SOLN
38.0000 [IU] | Freq: Two times a day (BID) | SUBCUTANEOUS | Status: DC
  Administered 2020-05-10 – 2020-05-11 (×2): 38 [IU] via SUBCUTANEOUS
  Filled 2020-05-10 (×2): qty 0.38

## 2020-05-10 MED ORDER — OXYCODONE HCL 5 MG PO TABS
5.0000 mg | ORAL_TABLET | ORAL | Status: DC | PRN
  Administered 2020-05-10: 5 mg via ORAL
  Filled 2020-05-10: qty 1

## 2020-05-10 MED ORDER — LORAZEPAM 0.5 MG PO TABS: 0.5000 mg | ORAL_TABLET | Freq: Two times a day (BID) | ORAL | Status: DC | PRN

## 2020-05-10 NOTE — Progress Notes (Signed)
Inpatient Diabetes Program Recommendations  AACE/ADA: New Consensus Statement on Inpatient Glycemic Control (2015)  Target Ranges:  Prepandial:   less than 140 mg/dL      Peak postprandial:   less than 180 mg/dL (1-2 hours)      Critically ill patients:  140 - 180 mg/dL   Lab Results  Component Value Date   GLUCAP 276 (H) 05/10/2020   HGBA1C 10.4 (H) 05/02/2020     Review of Glycemic Control Results for Carolyn Zimmerman, Carolyn Zimmerman (MRN [id number]) as of 05/10/2020 10:49  Ref. Range 05/09/2020 08:16 05/09/2020 11:44 05/09/2020 16:31 05/09/2020 21:47 05/10/2020 07:19  Glucose-Capillary Latest Ref Range: 70 - 99 mg/dL 419 (H) 379 (H) 024 (H) 181 (H) 276 (H)   Diabetes history: DM 2 Outpatient Diabetes medications: Metformin 500 mg tid (70/30 vial ans syringe before she lost insurance) Current orders for Inpatient glycemic control:  Levemir 25 units bid Novolog 0-20 units tid Novolog 10 units tid  Inpatient Diabetes Program Recommendations:    -  Increase Levemir to 38 units bid.   D/C: -  Lantus solostar insulin pen order # [id number] -  Insulin pen needles order # [id number]  Thanks,  Christena Deem RN, MSN, BC-ADM Inpatient Diabetes Coordinator Team Pager [phone] (8a-5p)

## 2020-05-10 NOTE — Progress Notes (Addendum)
8 Days Post-Op    CC: Abdominal pain and fever  Subjective: I was present for the abdominal wound VAC change.  Patient had a great deal of pain even after 1 mg of IV Dilaudid.  She will need more than this for pain control for her dressing change here, I do not see how she could do this at home with the current level of discomfort.  The wounds themselves all look good.  I just took a picture of the penectomy site which is granulating in nicely.  The smaller one in her groin and 2 on her chest are also clean and doing well.  Objective: Vital signs in last 24 hours: Temp:  [98.4 F (36.9 C)-98.6 F (37 C)] 98.4 F (36.9 C) (08/22 2145) Pulse Rate:  [85-88] 88 (08/22 2145) Resp:  [18] 18 (08/22 2145) BP: (151-169)/(97-99) 151/97 (08/22 2145) SpO2:  [97 %-98 %] 98 % (08/22 2145) Last BM Date: 05/08/20 480 p.o. recorded 100 IV recorded Urine x1 recorded No BM recorded Afebrile vital signs are stable blood pressure mildly elevated. WBC 15.1 STREPTOCOCCUS GROUP C  From wound culture on the  Intake/Output from previous day: 08/22 0701 - 08/23 0700 In: 580 [P.O.:480; IV Piggyback:100] Out: -  Intake/Output this shift: Total I/O In: 355 [P.O.:355] Out: -   General appearance: alert, cooperative and Significant pain with manipulation of the wound/dressing change.  Skin:The wound itself is clean with good granular tissue at the base.  The other open sites are also clean and healing nicely too.  See picture below.  Very close school this way of ne his gative test is negative and she is negative but [place]  Lab Results:  Recent Labs    05/09/20 0340 05/10/20 0500  WBC 14.7* 15.1*  HGB 10.0* 10.3*  HCT 32.3* 34.3*  PLT 449* 483*    BMET Recent Labs    05/08/20 0407 05/09/20 0340  NA 138  --   K 3.8  --   CL 102  --   CO2 27  --   GLUCOSE 238*  --   BUN 8  --   CREATININE 0.56 0.69  CALCIUM 8.6*  --    PT/INR No results for input(s): LABPROT, INR in the last 72  hours.  Recent Labs  Lab 05/04/20 0409 05/05/20 0711  AST 9* 11*  ALT 12 12  ALKPHOS 73 87  BILITOT 0.6 0.4  PROT 6.7 7.6  ALBUMIN 2.6* 2.7*     Lipase     Component Value Date/Time   LIPASE 28 03/25/2020 0746     Medications:  acetaminophen  1,000 mg Oral TID   amLODipine  10 mg Oral Daily   Chlorhexidine Gluconate Cloth  6 each Topical Daily   enoxaparin (LOVENOX) injection  40 mg Subcutaneous Q24H   feeding supplement (GLUCERNA SHAKE)  237 mL Oral BID BM   gabapentin  400 mg Oral TID   hydrochlorothiazide  50 mg Oral Daily   insulin aspart  0-20 Units Subcutaneous TID WC   insulin aspart  0-5 Units Subcutaneous QHS   insulin aspart  10 Units Subcutaneous TID WC   insulin detemir  32 Units Subcutaneous BID   lip balm  1 application Topical BID   mouth rinse  15 mL Mouth Rinse BID   psyllium  1 packet Oral BID   saccharomyces boulardii  250 mg Oral BID    Assessment/Plan Covid positive Sepsis due to necrotizing fasciitis/abdominal wall abscess -WBC 30.7>>41.5>>32.5>>16.5(8/17) >> 13.5>>15.0>>14.7>>15.1(8/23) Hyperglycemia  with uncontrolled type 2 diabetes Uncontrolled hypertension Microcytic anemia Morbid obesity BMI 44.36 Candidal intertrigo, POA Hx noncompliance   Panniculitis with abdominal wall abscess, chest wall abscess x2, left mons pubis/groin abscess, hidradenitis suppurativa Panniculectomy with excision of abdominal wall abscess and panniculitis, incision and debridement of chest wall abscess x2, incision and debridement of mons pubis abscess x1, 05/02/20, Dr. Karie Soda  - POD #5  - culture STREP GROUP C - wound vac MWF to large lower abdominal wound - BID wet to dry dressing changes to bilateral chest and left groin wounds  FEN: IVF KVO/heart healthy diet  ID: Clindamycin x 1,05/02/20; Diflucan times 18/15/21; meropenem x 1 8/15; maxipime/Flagyl 8/16-8/20;     Unasyn 8/20>> day 4   Plan:  Continue dressing changes with  wound vac, I have given her some PO's to be used before dressing change, ativan and oxycodone, hopefully this along with the Dilaudid can make dressing changes more comfortable.        LOS: 8 days    Agostino Gorin 05/10/2020 Please see Amion

## 2020-05-10 NOTE — Consult Note (Addendum)
WOC Nurse wound follow up Wound type: Full thickness abd wound dressing change performed with surgical PA at the bedside to assess wound appearance; pt is in isolation for Covid.  Surgical debridement, full thickness 1. 4cmX28cmX 5cm Red and moist, mod amt pink drainage in the cannister. Periwound: intact Pt was medicated  prior to the procedure but had large amt pain during the procedure.  Removed 2 pieces old NPWT dressing from the pannus Filled wound with  _1__ piece of black foam Sealed NPWT dressing at HG cont suction WOC nurse will continue to provide NPWT dressing changes Q M/W/F due to the complexity of the dressing change.  Cammie Mcgee MSN, RN, CWOCN, [place], CNS [phone]

## 2020-05-10 NOTE — Progress Notes (Signed)
PROGRESS NOTE  Carolyn Zimmerman  PPI:[id number] DOB: 01-12-1986 DOA: 05/02/2020 PCP: System, Pcp Not In Brief Narrative: Carolyn Zimmerman is a 34 y.o. female with a history of morbid obesity, T2DM, and HTN who presented to the ED 8/15 with palpable tender abdominal wall found to have panniculitis with abdominal abscess treated with broad spectrum antibiotics and panniculectomy. SARS-CoV-2 screen found to be positive, and the patient received regeneron monoclonal antibody. Wound care with wound vac is ongoing.  Assessment & Plan: Principal Problem:   Panniuclitis with abdominal wall abscess s/p panniculectomy 05/02/2020 Active Problems:   Chest wall abscesses x 2 s/p I&D 05/02/2020   Hyperglycemia   Hx of medication noncompliance   Uncontrolled hypertension   Type 2 diabetes mellitus with hyperglycemia (HCC)   Obesity, Class III, BMI 40-49.9 (morbid obesity) (HCC)   COVID-19 virus infection   Necrotizing fasciitis (HCC)   Sepsis (HCC)   Hidradenitis suppurativa of axillae,chest wall, groins  Sepsis due to necrotizing fasciitis, panniculitis, and abscesses of abdominal wall and chest wall s/p I&D 8/15 by Dr. Michaell Cowing: Sepsis POA, resolved. Surgical culture grew group C strep and mixed anaerobes.  - Based on culture data as above, continue unasyn and continue to monitor clinically. WBC continues to be elevated without fever or worsening appearance of wound. Will continue monitoring closely.  - Per surgery: BID W > D dressing changes to chest wall and left groin, continue wound vac changes MWF (last was 8/23).  - Analgesics as ordered.  Covid-19 infection: SARS-CoV-2 PCR screening (incidentally) positive on 8/15 - Continue isolation for 10 days from positive test (or 21 days if develops severe symptoms).  - s/p regeneron 8/18.   Uncontrolled T2DM: HbA1c 10.4%.  - Will increase levemir further to 38u BID, continue novolog 10u TIDWC + SSI, add HS coverage. Control is better. Will continue monitoring  closely - Needs tighter control for wound healing.  - Dietitian consulted, needs to avoid large indiscretions.  HTN: Pain probably contributing, but no hypotension/soft BPs noted. - Continue norvasc at increased dose of 10mg , continue HCTZ, increase dose to 50mg , and continue labetalol prn.  - Continue treating pain as above.  Iron deficiency anemia: Stable postoperative counts. Ferritin, iron, %sat wnl.  - Continue monitoring.  Candida intertrigo: s/p diflucan   Hypokalemia: Resolved with supplementation.  Morbid obesity: Estimated body mass index is 44.37 kg/m as calculated from the following:   Height as of this encounter: 5\' 7"  (1.702 m).   Weight as of this encounter: 128.5 kg.  DVT prophylaxis: Lovenox Code Status: Full Family Communication: None at bedside. Pt to relay plan of care. Disposition Plan:  Status is: Inpatient  Remains inpatient appropriate because:IV treatments appropriate due to intensity of illness or inability to take PO and Inpatient level of care appropriate due to severity of illness.    Dispo: The patient is from: Home              Anticipated d/c is to: Home              Anticipated d/c date is: 2 days as she is unable to tolerate wound vac changes without IV analgesia at this time.              Patient currently is not medically stable to d/c.  Consultants:   General surgery  Procedures:  05/02/20 IRRIGATION AND DEBRIDEMENT ABDOMINAL and CHEST WALL NECROTIZING , MD   Antimicrobials:  Vancomycin 8/15 - 8/17  Flagyl 8/15 - 8/20  Cefepime  8/16 - 8/20  Meropenem 8/15 x1  Clindamycin 8/15 x1  Zosyn 8/15 x1  Fluconazole 8/15 x1  Unasyn 8/20 >>  Subjective: Wound vac change this AM was exceedingly painful despite IV narcotic. Wound looks good, pt's pain now improved. No fevers or chills.   Objective: Vitals:   05/09/20 1343 05/09/20 2145 05/10/20 1012 05/10/20 1453  BP: (!) 169/99 (!) 151/97 (!) 154/93 (!)  148/97  Pulse: 85 88 90 87  Resp: 18 18 18 18   Temp: 98.6 F (37 C) 98.4 F (36.9 C)  97.8 F (36.6 C)  TempSrc: Oral   Oral  SpO2: 97% 98%  100%  Weight:      Height:        Intake/Output Summary (Last 24 hours) at 05/10/2020 1530 Last data filed at 05/10/2020 05/12/2020 Gross per 24 hour  Intake 355 ml  Output --  Net 355 ml   Filed Weights   05/02/20 0235 05/02/20 1645  Weight: 128.8 kg 128.5 kg   Gen: 34 y.o. female in no distress Pulm: Nonlabored breathing room air. Clear. CV: Regular rate and rhythm. No murmur, rub, or gallop. No JVD, no dependent edema. GI: Abdomen soft, non-tender, non-distended, with normoactive bowel sounds.  Ext: Warm, no deformities Skin: No new rashes, lesions or ulcers on visualized skin. Wound not examined by me today. See pictures in surgery note  Neuro: Alert and oriented. No focal neurological deficits. Psych: Judgement and insight appear fair. Mood euthymic & affect congruent. Behavior is appropriate.    Data Reviewed: I have personally reviewed following labs and imaging studies  CBC: Recent Labs  Lab 05/06/20 0346 05/07/20 0316 05/08/20 0407 05/09/20 0340 05/10/20 0500  WBC 12.1* 13.5* 15.0* 14.7* 15.1*  NEUTROABS  --   --  9.1* 8.3* 8.9*  HGB 9.7* 9.6* 10.2* 10.0* 10.3*  HCT 32.8* 31.3* 32.9* 32.3* 34.3*  MCV 81.2 79.6* 80.4 80.1 80.9  PLT 393 403* 472* 449* 483*   Basic Metabolic Panel: Recent Labs  Lab 05/04/20 0409 05/05/20 0711 05/08/20 0407 05/09/20 0340  NA 139 135 138  --   K 3.4* 3.5 3.8  --   CL 106 103 102  --   CO2 23 23 27   --   GLUCOSE 183* 213* 238*  --   BUN 8 8 8   --   CREATININE 0.60 0.56 0.56 0.69  CALCIUM 8.3* 9.0 8.6*  --    GFR: Estimated Creatinine Clearance: 142.2 mL/min (by C-G formula based on SCr of 0.69 mg/dL). Liver Function Tests: Recent Labs  Lab 05/04/20 0409 05/05/20 0711  AST 9* 11*  ALT 12 12  ALKPHOS 73 87  BILITOT 0.6 0.4  PROT 6.7 7.6  ALBUMIN 2.6* 2.7*   No results for  input(s): LIPASE, AMYLASE in the last 168 hours. No results for input(s): AMMONIA in the last 168 hours. Coagulation Profile: No results for input(s): INR, PROTIME in the last 168 hours. Cardiac Enzymes: No results for input(s): CKTOTAL, CKMB, CKMBINDEX, TROPONINI in the last 168 hours. BNP (last 3 results) No results for input(s): PROBNP in the last 8760 hours. HbA1C: No results for input(s): HGBA1C in the last 72 hours. CBG: Recent Labs  Lab 05/09/20 1144 05/09/20 1631 05/09/20 2147 05/10/20 0719 05/10/20 1122  GLUCAP 248* 295* 181* 276* 223*   Lipid Profile: No results for input(s): CHOL, HDL, LDLCALC, TRIG, CHOLHDL, LDLDIRECT in the last 72 hours. Thyroid Function Tests: No results for input(s): TSH, T4TOTAL, FREET4, T3FREE, THYROIDAB in the last 72  hours. Anemia Panel: No results for input(s): VITAMINB12, FOLATE, FERRITIN, TIBC, IRON, RETICCTPCT in the last 72 hours. Urine analysis:    Component Value Date/Time   COLORURINE ORANGE (A) 05/02/2020 0422   APPEARANCEUR CLOUDY (A) 05/02/2020 0422   LABSPEC 1.010 05/02/2020 0422   PHURINE 6.5 05/02/2020 0422   GLUCOSEU >=500 (A) 05/02/2020 0422   HGBUR TRACE (A) 05/02/2020 0422   BILIRUBINUR NEGATIVE 05/02/2020 0422   KETONESUR 15 (A) 05/02/2020 0422   PROTEINUR 100 (A) 05/02/2020 0422   UROBILINOGEN 1.0 01/19/2012 0015   NITRITE NEGATIVE 05/02/2020 0422   LEUKOCYTESUR TRACE (A) 05/02/2020 0422   Recent Results (from the past 240 hour(s))  SARS Coronavirus 2 by RT PCR (hospital order, performed in [facility] hospital lab) Nasopharyngeal Nasopharyngeal Swab     Status: Abnormal   Collection Time: 05/02/20  2:58 AM   Specimen: Nasopharyngeal Swab  Result Value Ref Range Status   SARS Coronavirus 2 POSITIVE (A) NEGATIVE Final    Comment: RESULT CALLED TO, READ BACK BY AND VERIFIED WITH: MAYNARD,C AT 0407 ON [id number] BY CHERESNOWSKY,T (NOTE) SARS-CoV-2 target nucleic acids are DETECTED  SARS-CoV-2 RNA is generally  detectable in upper respiratory specimens  during the acute phase of infection.  Positive results are indicative  of the presence of the identified virus, but do not rule out bacterial infection or co-infection with other pathogens not detected by the test.  Clinical correlation with patient history and  other diagnostic information is necessary to determine patient infection status.  The expected result is negative.  Fact Sheet for Patients:   BoilerBrush.com.cy   Fact Sheet for Healthcare Providers:   https://pope.com/    This test is not yet approved or cleared by the [place] FDA and  has been authorized for detection and/or diagnosis of SARS-CoV-2 by FDA under an Emergency Use Authorization (EUA).  This EUA will remain in effect (mean ing this test can be used) for the duration of  the COVID-19 declaration under Section 564(b)(1) of the Act, 21 U.S.C. section 360-bbb-3(b)(1), unless the authorization is terminated or revoked sooner.  Performed at [facility], [address] [place], [place] [address]   Culture, blood (routine x 2)     Status: None   Collection Time: 05/02/20  5:30 AM   Specimen: BLOOD LEFT HAND  Result Value Ref Range Status   Specimen Description   Final    BLOOD LEFT HAND Performed at [facility], [address] [place], [place] [address]    Special Requests   Final    BOTTLES DRAWN AEROBIC AND ANAEROBIC Blood Culture adequate volume Performed at [facility], [address] [place], [place] [address]    Culture   Final    NO GROWTH 5 DAYS Performed at [facility], [address], [place], [place] [address]    Report Status 05/07/2020 FINAL  Final  Culture, blood (routine x 2)     Status: None   Collection Time: 05/02/20  5:40 AM   Specimen: BLOOD LEFT WRIST  Result Value Ref Range Status   Specimen Description   Final    BLOOD LEFT WRIST Performed  at [facility], [address] [place], [place] [address]    Special Requests   Final    BOTTLES DRAWN AEROBIC AND ANAEROBIC Blood Culture adequate volume Performed at [facility], [address], [place], [place] [address]  Culture   Final    NO GROWTH 5 DAYS Performed at [facility], [address] [place], [place] [address]    Report Status 05/07/2020 FINAL  Final  Aerobic/Anaerobic Culture (surgical/deep wound)     Status: Abnormal   Collection Time: 05/02/20  1:16 PM   Specimen: Wound  Result Value Ref Range Status   Specimen Description   Final    WOUND Performed at [facility], [address] [place], [place] [address]    Special Requests   Final    NONE Performed at [facility], [address] [place], [place] [address]    Gram Stain   Final    NO WBC SEEN MODERATE GRAM NEGATIVE RODS FEW GRAM POSITIVE COCCI RARE GRAM POSITIVE RODS Performed at [facility], [address] [place], [place] [address]    Culture (A)  Final    STREPTOCOCCUS GROUP C Beta hemolytic streptococci are predictably susceptible to penicillin and other beta lactams. Susceptibility testing not routinely performed. MIXED ANAEROBIC FLORA PRESENT.  CALL LAB IF FURTHER IID REQUIRED.    Report Status 05/07/2020 FINAL  Final  Culture, blood (routine x 2)     Status: None   Collection Time: 05/02/20  5:11 PM   Specimen: BLOOD  Result Value Ref Range Status   Specimen Description   Final    BLOOD Performed at [facility], [address] [place], [place] [address]    Special Requests   Final    BOTTLES DRAWN AEROBIC AND ANAEROBIC Blood Culture adequate volume Performed at [facility], [address] [place], [place] [address]    Culture   Final    NO GROWTH 5 DAYS Performed at [facility], [address] [place], [place] [address]    Report Status 05/07/2020 FINAL   Final  Culture, blood (routine x 2)     Status: None   Collection Time: 05/02/20  5:31 PM   Specimen: BLOOD RIGHT HAND  Result Value Ref Range Status   Specimen Description   Final    BLOOD RIGHT HAND Performed at [facility], [address] [place], [place] [address]    Special Requests   Final    BOTTLES DRAWN AEROBIC AND ANAEROBIC Blood Culture adequate volume Performed at [facility], [address] [place], [place] [address]    Culture   Final    NO GROWTH 5 DAYS Performed at [facility], [address] [place], [place] [address]    Report Status 05/07/2020 FINAL  Final      Radiology Studies: No results found.  Scheduled Meds: . acetaminophen  1,000 mg Oral TID  . amLODipine  10 mg Oral Daily  . Chlorhexidine Gluconate Cloth  6 each Topical Daily  . enoxaparin (LOVENOX) injection  40 mg Subcutaneous Q24H  . feeding supplement (GLUCERNA SHAKE)  237 mL Oral BID BM  . gabapentin  400 mg Oral TID  . hydrochlorothiazide  50 mg Oral Daily  . insulin aspart  0-20 Units Subcutaneous TID WC  . insulin aspart  0-5 Units Subcutaneous QHS  . insulin aspart  10 Units Subcutaneous TID WC  . insulin detemir  38 Units Subcutaneous BID  . lip balm  1 application Topical BID  . mouth rinse  15 mL Mouth Rinse BID  . psyllium  1 packet Oral BID  . saccharomyces boulardii  250 mg Oral  BID   Continuous Infusions: . sodium chloride Stopped (05/02/20 1000)  . ampicillin-sulbactam (UNASYN) IV 3 g (05/10/20 1450)     LOS: 8 days   Time spent: 35 minutes.  Tyrone Nine, MD [facility] [email or web address] 05/10/2020, 3:30 PM

## 2020-05-11 LAB — CBC WITH DIFFERENTIAL/PLATELET
Abs Immature Granulocytes: 0.38 10*3/uL — ABNORMAL HIGH (ref 0.00–0.07)
Basophils Absolute: 0.1 10*3/uL (ref 0.0–0.1)
Basophils Relative: 0 %
Eosinophils Absolute: 0.4 10*3/uL (ref 0.0–0.5)
Eosinophils Relative: 2 %
HCT: 36 % (ref 36.0–46.0)
Hemoglobin: 10.9 g/dL — ABNORMAL LOW (ref 12.0–15.0)
Immature Granulocytes: 2 %
Lymphocytes Relative: 26 %
Lymphs Abs: 4.4 10*3/uL — ABNORMAL HIGH (ref 0.7–4.0)
MCH: 24.2 pg — ABNORMAL LOW (ref 26.0–34.0)
MCHC: 30.3 g/dL (ref 30.0–36.0)
MCV: 80 fL (ref 80.0–100.0)
Monocytes Absolute: 1.1 10*3/uL — ABNORMAL HIGH (ref 0.1–1.0)
Monocytes Relative: 6 %
Neutro Abs: 10.6 10*3/uL — ABNORMAL HIGH (ref 1.7–7.7)
Neutrophils Relative %: 64 %
Platelets: 578 10*3/uL — ABNORMAL HIGH (ref 150–400)
RBC: 4.5 MIL/uL (ref 3.87–5.11)
RDW: 16.8 % — ABNORMAL HIGH (ref 11.5–15.5)
WBC: 16.9 10*3/uL — ABNORMAL HIGH (ref 4.0–10.5)
nRBC: 0 % (ref 0.0–0.2)

## 2020-05-11 LAB — GLUCOSE, CAPILLARY
Glucose-Capillary: 205 mg/dL — ABNORMAL HIGH (ref 70–99)
Glucose-Capillary: 241 mg/dL — ABNORMAL HIGH (ref 70–99)
Glucose-Capillary: 277 mg/dL — ABNORMAL HIGH (ref 70–99)
Glucose-Capillary: 281 mg/dL — ABNORMAL HIGH (ref 70–99)

## 2020-05-11 MED ORDER — INSULIN DETEMIR 100 UNIT/ML ~~LOC~~ SOLN
42.0000 [IU] | Freq: Two times a day (BID) | SUBCUTANEOUS | Status: DC
  Administered 2020-05-11 – 2020-05-12 (×2): 42 [IU] via SUBCUTANEOUS
  Filled 2020-05-11 (×2): qty 0.42

## 2020-05-11 NOTE — Progress Notes (Signed)
PROGRESS NOTE  Carolyn Zimmerman  KYH:[id number] DOB: 10-21-1985 DOA: 05/02/2020 PCP: System, Pcp Not In Brief Narrative: Carolyn Zimmerman is a 34 y.o. female with a history of morbid obesity, T2DM, and HTN who presented to the ED 8/15 with palpable tender abdominal wall found to have panniculitis with abdominal abscess treated with broad spectrum antibiotics and panniculectomy. SARS-CoV-2 screen found to be positive, and the patient received regeneron monoclonal antibody. Wound care with wound vac is ongoing.  Assessment & Plan: Principal Problem:   Panniuclitis with abdominal wall abscess s/p panniculectomy 05/02/2020 Active Problems:   Chest wall abscesses x 2 s/p I&D 05/02/2020   Hyperglycemia   Hx of medication noncompliance   Uncontrolled hypertension   Type 2 diabetes mellitus with hyperglycemia (HCC)   Obesity, Class III, BMI 40-49.9 (morbid obesity) (HCC)   COVID-19 virus infection   Necrotizing fasciitis (HCC)   Sepsis (HCC)   Hidradenitis suppurativa of axillae,chest wall, groins  Sepsis due to necrotizing fasciitis, panniculitis, and abscesses of abdominal wall and chest wall s/p I&D 8/15 by Dr. Michaell Cowing: Sepsis POA, resolved. Surgical culture grew group C strep and mixed anaerobes.  - Based on culture data, continue unasyn. Blood cultures have been negative. WBC remains elevated, though wounds appear to be healing well and there are no symptoms of additional sites of infection. D/w ID, Dr. Luciana Axe, by phone who confirms unasyn to provide adequate coverage based on culture data. Could Rx augmentin at discharge.  - Per surgery: BID W > D dressing changes to chest wall and left groin, continue wound vac changes MWF (last was 8/23).  - Analgesics as ordered.  Covid-19 infection: SARS-CoV-2 PCR screening (incidentally) positive on 8/15 - Can discontinue airborne/contact isolation beginning 8/25 (10 days from positive test) - s/p regeneron 8/18.   Uncontrolled T2DM: HbA1c 10.4%.  - Will  increase levemir further to 42u BID, continue novolog 10u TIDWC + SSI, HS coverage. - Needs tighter control for wound healing.  - Dietitian consulted, needs to avoid large indiscretions.  HTN: Pain probably contributing, but no hypotension/soft BPs noted. - Continue norvasc at increased dose of 10mg , continue HCTZ, increase dose to 50mg , and continue labetalol prn.  - Continue treating pain as above.  Iron deficiency anemia: Stable postoperative counts. Ferritin, iron, %sat wnl.  - Continue monitoring.  Candida intertrigo: s/p diflucan   Hypokalemia: Resolved with supplementation.  Morbid obesity: Estimated body mass index is 44.37 kg/m as calculated from the following:   Height as of this encounter: 5\' 7"  (1.702 m).   Weight as of this encounter: 128.5 kg.  DVT prophylaxis: Lovenox Code Status: Full Family Communication: None at bedside. Pt to relay plan of care. Disposition Plan:  Status is: Inpatient  Remains inpatient appropriate because:IV treatments appropriate due to intensity of illness or inability to take PO and Inpatient level of care appropriate due to severity of illness.    Dispo: The patient is from: Home              Anticipated d/c is to: Home              Anticipated d/c date is: 2 days as she is unable to tolerate wound vac changes without IV analgesia at this time.              Patient currently is not medically stable to d/c.  Consultants:   General surgery  Procedures:  05/02/20 IRRIGATION AND DEBRIDEMENT ABDOMINAL and CHEST WALL NECROTIZING , MD   Antimicrobials:  Vancomycin 8/15 - 8/17  Flagyl 8/15 - 8/20  Cefepime 8/16 - 8/20  Meropenem 8/15 x1  Clindamycin 8/15 x1  Zosyn 8/15 x1  Fluconazole 8/15 x1  Unasyn 8/20 >>  Subjective: Pain is controlled during dressing changes for chest and left inguinal wounds. No cough, shortness of breath or chest pain. Denies feeling unwell or having  fever/chills.  Objective: Vitals:   05/10/20 1012 05/10/20 1453 05/10/20 1943 05/11/20 0448  BP: (!) 154/93 (!) 148/97 129/77 (!) 141/77  Pulse: 90 87 91 87  Resp: 18 18 17 17   Temp:  97.8 F (36.6 C) 98.6 F (37 C) 98.3 F (36.8 C)  TempSrc:  Oral    SpO2:  100% 99% 98%  Weight:      Height:        Intake/Output Summary (Last 24 hours) at 05/11/2020 1318 Last data filed at 05/11/2020 05/13/2020 Gross per 24 hour  Intake 255 ml  Output --  Net 255 ml   Filed Weights   05/02/20 0235 05/02/20 1645  Weight: 128.8 kg 128.5 kg   Gen: 34 y.o. female in no distress Pulm: Nonlabored breathing room air. Clear. CV: Regular rate and rhythm. No murmur, rub, or gallop. No JVD, no dependent edema. GI: Abdomen soft, non-tender, non-distended, with normoactive bowel sounds.  Ext: Warm, no deformities Skin: No new rashes, lesions or ulcers on visualized skin. Large transverse paniculectomy site with wound vac. Around wound is less indurated, no spreading erythema, less tender than previous exams. Neuro: Alert and oriented. No focal neurological deficits. Psych: Judgement and insight appear fair. Mood euthymic & affect congruent. Behavior is appropriate.    Data Reviewed: I have personally reviewed following labs and imaging studies  CBC: Recent Labs  Lab 05/07/20 0316 05/08/20 0407 05/09/20 0340 05/10/20 0500 05/11/20 0419  WBC 13.5* 15.0* 14.7* 15.1* 16.9*  NEUTROABS  --  9.1* 8.3* 8.9* 10.6*  HGB 9.6* 10.2* 10.0* 10.3* 10.9*  HCT 31.3* 32.9* 32.3* 34.3* 36.0  MCV 79.6* 80.4 80.1 80.9 80.0  PLT 403* 472* 449* 483* 578*   Basic Metabolic Panel: Recent Labs  Lab 05/05/20 0711 05/08/20 0407 05/09/20 0340  NA 135 138  --   K 3.5 3.8  --   CL 103 102  --   CO2 23 27  --   GLUCOSE 213* 238*  --   BUN 8 8  --   CREATININE 0.56 0.56 0.69  CALCIUM 9.0 8.6*  --    GFR: Estimated Creatinine Clearance: 142.2 mL/min (by C-G formula based on SCr of 0.69 mg/dL). Liver Function  Tests: Recent Labs  Lab 05/05/20 0711  AST 11*  ALT 12  ALKPHOS 87  BILITOT 0.4  PROT 7.6  ALBUMIN 2.7*   No results for input(s): LIPASE, AMYLASE in the last 168 hours. No results for input(s): AMMONIA in the last 168 hours. Coagulation Profile: No results for input(s): INR, PROTIME in the last 168 hours. Cardiac Enzymes: No results for input(s): CKTOTAL, CKMB, CKMBINDEX, TROPONINI in the last 168 hours. BNP (last 3 results) No results for input(s): PROBNP in the last 8760 hours. HbA1C: No results for input(s): HGBA1C in the last 72 hours. CBG: Recent Labs  Lab 05/10/20 1122 05/10/20 1643 05/10/20 1947 05/11/20 0749 05/11/20 1148  GLUCAP 223* 246* 236* 205* 241*   Lipid Profile: No results for input(s): CHOL, HDL, LDLCALC, TRIG, CHOLHDL, LDLDIRECT in the last 72 hours. Thyroid Function Tests: No results for input(s): TSH, T4TOTAL, FREET4, T3FREE, THYROIDAB in the last  72 hours. Anemia Panel: No results for input(s): VITAMINB12, FOLATE, FERRITIN, TIBC, IRON, RETICCTPCT in the last 72 hours. Urine analysis:    Component Value Date/Time   COLORURINE ORANGE (A) 05/02/2020 0422   APPEARANCEUR CLOUDY (A) 05/02/2020 0422   LABSPEC 1.010 05/02/2020 0422   PHURINE 6.5 05/02/2020 0422   GLUCOSEU >=500 (A) 05/02/2020 0422   HGBUR TRACE (A) 05/02/2020 0422   BILIRUBINUR NEGATIVE 05/02/2020 0422   KETONESUR 15 (A) 05/02/2020 0422   PROTEINUR 100 (A) 05/02/2020 0422   UROBILINOGEN 1.0 01/19/2012 0015   NITRITE NEGATIVE 05/02/2020 0422   LEUKOCYTESUR TRACE (A) 05/02/2020 0422   Recent Results (from the past 240 hour(s))  SARS Coronavirus 2 by RT PCR (hospital order, performed in [facility] hospital lab) Nasopharyngeal Nasopharyngeal Swab     Status: Abnormal   Collection Time: 05/02/20  2:58 AM   Specimen: Nasopharyngeal Swab  Result Value Ref Range Status   SARS Coronavirus 2 POSITIVE (A) NEGATIVE Final    Comment: RESULT CALLED TO, READ BACK BY AND VERIFIED  WITH: MAYNARD,C AT 0407 ON [id number] BY CHERESNOWSKY,T (NOTE) SARS-CoV-2 target nucleic acids are DETECTED  SARS-CoV-2 RNA is generally detectable in upper respiratory specimens  during the acute phase of infection.  Positive results are indicative  of the presence of the identified virus, but do not rule out bacterial infection or co-infection with other pathogens not detected by the test.  Clinical correlation with patient history and  other diagnostic information is necessary to determine patient infection status.  The expected result is negative.  Fact Sheet for Patients:   BoilerBrush.com.cy   Fact Sheet for Healthcare Providers:   https://pope.com/    This test is not yet approved or cleared by the [place] FDA and  has been authorized for detection and/or diagnosis of SARS-CoV-2 by FDA under an Emergency Use Authorization (EUA).  This EUA will remain in effect (mean ing this test can be used) for the duration of  the COVID-19 declaration under Section 564(b)(1) of the Act, 21 U.S.C. section 360-bbb-3(b)(1), unless the authorization is terminated or revoked sooner.  Performed at [facility], [address] [place], [place] [address]   Culture, blood (routine x 2)     Status: None   Collection Time: 05/02/20  5:30 AM   Specimen: BLOOD LEFT HAND  Result Value Ref Range Status   Specimen Description   Final    BLOOD LEFT HAND Performed at [facility], [address] [place], [place] [address]    Special Requests   Final    BOTTLES DRAWN AEROBIC AND ANAEROBIC Blood Culture adequate volume Performed at [facility], [address] [place], [place] [address]    Culture   Final    NO GROWTH 5 DAYS Performed at [facility], [address], [place], [place] [address]    Report Status 05/07/2020 FINAL  Final  Culture, blood (routine x 2)     Status: None   Collection Time:  05/02/20  5:40 AM   Specimen: BLOOD LEFT WRIST  Result Value Ref Range Status   Specimen Description   Final    BLOOD LEFT WRIST Performed at [facility] [address] [place], [place] [address]    Special Requests   Final    BOTTLES DRAWN AEROBIC AND ANAEROBIC Blood Culture adequate volume Performed at [facility], [address], [place], [place] [address]  Culture   Final    NO GROWTH 5 DAYS Performed at [facility], [address] [place], [place] [address]    Report Status 05/07/2020 FINAL  Final  Aerobic/Anaerobic Culture (surgical/deep wound)     Status: Abnormal   Collection Time: 05/02/20  1:16 PM   Specimen: Wound  Result Value Ref Range Status   Specimen Description   Final    WOUND Performed at [facility], [address] [place], [place] [address]    Special Requests   Final    NONE Performed at [facility], [address] [place], [place] [address]    Gram Stain   Final    NO WBC SEEN MODERATE GRAM NEGATIVE RODS FEW GRAM POSITIVE COCCI RARE GRAM POSITIVE RODS Performed at [facility], [address] [place], [place] [address]    Culture (A)  Final    STREPTOCOCCUS GROUP C Beta hemolytic streptococci are predictably susceptible to penicillin and other beta lactams. Susceptibility testing not routinely performed. MIXED ANAEROBIC FLORA PRESENT.  CALL LAB IF FURTHER IID REQUIRED.    Report Status 05/07/2020 FINAL  Final  Culture, blood (routine x 2)     Status: None   Collection Time: 05/02/20  5:11 PM   Specimen: BLOOD  Result Value Ref Range Status   Specimen Description   Final    BLOOD Performed at [facility], [address] [place], [place] [address]    Special Requests   Final    BOTTLES DRAWN AEROBIC AND ANAEROBIC Blood Culture adequate volume Performed at [facility], [address] [place], [place] [address]     Culture   Final    NO GROWTH 5 DAYS Performed at [facility], [address] [place], [place] [address]    Report Status 05/07/2020 FINAL  Final  Culture, blood (routine x 2)     Status: None   Collection Time: 05/02/20  5:31 PM   Specimen: BLOOD RIGHT HAND  Result Value Ref Range Status   Specimen Description   Final    BLOOD RIGHT HAND Performed at [facility], [address] [place], [place] [address]    Special Requests   Final    BOTTLES DRAWN AEROBIC AND ANAEROBIC Blood Culture adequate volume Performed at [facility], [address] [place], [place] [address]    Culture   Final    NO GROWTH 5 DAYS Performed at [facility], [address] [place], [place] [address]    Report Status 05/07/2020 FINAL  Final      Radiology Studies: No results found.  Scheduled Meds: . acetaminophen  1,000 mg Oral TID  . amLODipine  10 mg Oral Daily  . Chlorhexidine Gluconate Cloth  6 each Topical Daily  . enoxaparin (LOVENOX) injection  40 mg Subcutaneous Q24H  . feeding supplement (GLUCERNA SHAKE)  237 mL Oral BID BM  . gabapentin  400 mg Oral TID  . hydrochlorothiazide  50 mg Oral Daily  . insulin aspart  0-20 Units Subcutaneous TID WC  . insulin aspart  0-5 Units Subcutaneous QHS  . insulin aspart  10 Units Subcutaneous TID WC  . insulin detemir  38 Units Subcutaneous BID  . lip balm  1 application Topical BID  . mouth rinse  15 mL Mouth Rinse BID  . psyllium  1 packet Oral BID  . saccharomyces boulardii  250 mg Oral  BID   Continuous Infusions: . sodium chloride Stopped (05/02/20 1000)  . ampicillin-sulbactam (UNASYN) IV 3 g (05/11/20 0817)     LOS: 9 days   Time spent: 35 minutes.  Tyrone Nine, MD [facility] [email or web address] 05/11/2020, 1:18 PM

## 2020-05-11 NOTE — Progress Notes (Signed)
Inpatient Diabetes Program Recommendations  AACE/ADA: New Consensus Statement on Inpatient Glycemic Control (2015)  Target Ranges:  Prepandial:   less than 140 mg/dL      Peak postprandial:   less than 180 mg/dL (1-2 hours)      Critically ill patients:  140 - 180 mg/dL   Lab Results  Component Value Date   GLUCAP 241 (H) 05/11/2020   HGBA1C 10.4 (H) 05/02/2020    Review of Glycemic Control Results for Carolyn Zimmerman, Carolyn Zimmerman (MRN [id number]) as of 05/11/2020 12:02  Ref. Range 05/10/2020 07:19 05/10/2020 11:22 05/10/2020 16:43 05/10/2020 19:47 05/11/2020 07:49 05/11/2020 11:48  Glucose-Capillary Latest Ref Range: 70 - 99 mg/dL 681 (H) 275 (H) 170 (H) 236 (H) 205 (H) 241 (H)    Diabetes history: DM 2 Outpatient Diabetes medications: Metformin 500 mg tid (70/30 vial ans syringe before she lost insurance) Current orders for Inpatient glycemic control:  Levemir 38 units bid Novolog 0-20 units tid Novolog 10 units tid  Inpatient Diabetes Program Recommendations:    -  Increase Levemir to 42 units bid.  Thanks,  Christena Deem RN, MSN, BC-ADM Inpatient Diabetes Coordinator Team Pager [phone] (8a-5p)

## 2020-05-12 DIAGNOSIS — M793 Panniculitis, unspecified: Secondary | ICD-10-CM

## 2020-05-12 LAB — COMPREHENSIVE METABOLIC PANEL
ALT: 16 U/L (ref 0–44)
AST: 16 U/L (ref 15–41)
Albumin: 2.9 g/dL — ABNORMAL LOW (ref 3.5–5.0)
Alkaline Phosphatase: 105 U/L (ref 38–126)
Anion gap: 9 (ref 5–15)
BUN: 12 mg/dL (ref 6–20)
CO2: 25 mmol/L (ref 22–32)
Calcium: 8.5 mg/dL — ABNORMAL LOW (ref 8.9–10.3)
Chloride: 97 mmol/L — ABNORMAL LOW (ref 98–111)
Creatinine, Ser: 0.7 mg/dL (ref 0.44–1.00)
GFR calc Af Amer: >60 mL/min (ref >60)
GFR calc non Af Amer: >60 mL/min (ref >60)
Glucose, Bld: 323 mg/dL — ABNORMAL HIGH (ref 70–99)
Potassium: 3.8 mmol/L (ref 3.5–5.1)
Sodium: 131 mmol/L — ABNORMAL LOW (ref 135–145)
Total Bilirubin: 0.6 mg/dL (ref 0.3–1.2)
Total Protein: 7.6 g/dL (ref 6.5–8.1)

## 2020-05-12 LAB — CBC WITH DIFFERENTIAL/PLATELET
Abs Immature Granulocytes: 0.18 10*3/uL — ABNORMAL HIGH (ref 0.00–0.07)
Basophils Absolute: 0.1 10*3/uL (ref 0.0–0.1)
Basophils Relative: 0 %
Eosinophils Absolute: 0.3 10*3/uL (ref 0.0–0.5)
Eosinophils Relative: 2 %
HCT: 33.1 % — ABNORMAL LOW (ref 36.0–46.0)
Hemoglobin: 10.1 g/dL — ABNORMAL LOW (ref 12.0–15.0)
Immature Granulocytes: 1 %
Lymphocytes Relative: 29 %
Lymphs Abs: 3.6 10*3/uL (ref 0.7–4.0)
MCH: 24.6 pg — ABNORMAL LOW (ref 26.0–34.0)
MCHC: 30.5 g/dL (ref 30.0–36.0)
MCV: 80.7 fL (ref 80.0–100.0)
Monocytes Absolute: 0.8 10*3/uL (ref 0.1–1.0)
Monocytes Relative: 6 %
Neutro Abs: 7.7 10*3/uL (ref 1.7–7.7)
Neutrophils Relative %: 62 %
Platelets: 483 10*3/uL — ABNORMAL HIGH (ref 150–400)
RBC: 4.1 MIL/uL (ref 3.87–5.11)
RDW: 16.7 % — ABNORMAL HIGH (ref 11.5–15.5)
WBC: 12.6 10*3/uL — ABNORMAL HIGH (ref 4.0–10.5)
nRBC: 0 % (ref 0.0–0.2)

## 2020-05-12 LAB — GLUCOSE, CAPILLARY
Glucose-Capillary: 303 mg/dL — ABNORMAL HIGH (ref 70–99)
Glucose-Capillary: 291 mg/dL — ABNORMAL HIGH (ref 70–99)

## 2020-05-12 MED ORDER — AMLODIPINE BESYLATE 10 MG PO TABS: 10.0000 mg | ORAL_TABLET | Freq: Every day | ORAL | 3 refills | Status: DC

## 2020-05-12 MED ORDER — INSULIN ASPART PROT & ASPART (70-30 MIX) 100 UNIT/ML ~~LOC~~ SUSP: 60.0000 [IU] | Freq: Two times a day (BID) | SUBCUTANEOUS | 3 refills | Status: DC

## 2020-05-12 MED ORDER — OXYCODONE HCL 5 MG PO TABS: 5.0000 mg | ORAL_TABLET | Freq: Three times a day (TID) | ORAL | 0 refills | Status: DC | PRN

## 2020-05-12 MED ORDER — POLYETHYLENE GLYCOL 3350 17 G PO PACK: 17.0000 g | PACK | Freq: Every day | ORAL | 0 refills | Status: DC | PRN

## 2020-05-12 MED ORDER — GABAPENTIN 400 MG PO CAPS: 400.0000 mg | ORAL_CAPSULE | Freq: Three times a day (TID) | ORAL | 0 refills | Status: DC

## 2020-05-12 MED ORDER — "INSULIN SYRINGE 31G X 5/16"" 0.3 ML MISC": 3 refills | Status: DC

## 2020-05-12 MED ORDER — HYDROCHLOROTHIAZIDE 25 MG PO TABS
25.0000 mg | ORAL_TABLET | Freq: Every day | ORAL | 1 refills | Status: DC
Start: 2020-05-12 — End: 2020-06-02

## 2020-05-12 MED ORDER — ONDANSETRON 4 MG PO TBDP: 4.0000 mg | ORAL_TABLET | Freq: Three times a day (TID) | ORAL | 0 refills | Status: DC | PRN

## 2020-05-12 MED ORDER — PSYLLIUM 95 % PO PACK: 1.0000 | PACK | Freq: Two times a day (BID) | ORAL | 1 refills | Status: DC

## 2020-05-12 MED ORDER — ACETAMINOPHEN 500 MG PO TABS: 1000.0000 mg | ORAL_TABLET | Freq: Three times a day (TID) | ORAL | 0 refills | Status: DC | PRN

## 2020-05-12 NOTE — Discharge Summary (Signed)
Physician Discharge Summary   Patient ID: Carolyn Zimmerman MRN: [id number] DOB/AGE: 1986-06-28 34 y.o.  Admit date: 05/02/2020 Discharge date: 05/12/2020  Primary Care Physician:  System, Pcp Not In   Recommendations for Outpatient Follow-up:  1. Follow up with PCP in 1-2 weeks 2. Dressing change Tuesday Thursday and Saturday with wound VAC changes, home health, last done today on 8/25 prior to discharge.  Home Health: Home health RN Equipment/Devices: Wound VAC  Discharge Condition: stable CODE STATUS: FULL  Diet recommendation: Carb modified   Discharge Diagnoses:     Sepsis secondary to necrotizing fasciitis/abdominal wall abscess  Panniculitis with abdominal wall abscess, chest wall abscess x2  left mons pubis/groin abscess  hidradenitis suppurativa . Covid-19 infection  . Type 2 diabetes mellitus, uncontrolled with hyperglycemia ([place]) . Uncontrolled hypertension    Hypokalemia    Morbid obesity    Candida intertrigo  Consults: General surgery, Dr Johney Maine    Allergies:   Allergies  Allergen Reactions  . Contrast Media [Iodinated Diagnostic Agents] Hives and Swelling     DISCHARGE MEDICATIONS: Allergies as of 05/12/2020      Reactions   Contrast Media [iodinated Diagnostic Agents] Hives, Swelling      Medication List    STOP taking these medications   lisinopril 10 MG tablet Commonly known as: ZESTRIL   naproxen sodium 220 MG tablet Commonly known as: ALEVE     TAKE these medications   acetaminophen 500 MG tablet Commonly known as: TYLENOL Take 2 tablets (1,000 mg total) by mouth every 8 (eight) hours as needed for mild pain.   albuterol 108 (90 Base) MCG/ACT inhaler Commonly known as: VENTOLIN HFA Inhale 2 puffs into the lungs every 4 (four) hours as needed for wheezing or shortness of breath.   amLODipine 10 MG tablet Commonly known as: NORVASC Take 1 tablet (10 mg total) by mouth daily. What changed:   medication strength  how much to  take   blood glucose meter kit and supplies Dispense based on patient and insurance preference. Use up to four times daily as directed. (FOR ICD-9 250.00, 250.01).   gabapentin 400 MG capsule Commonly known as: NEURONTIN Take 1 capsule (400 mg total) by mouth 3 (three) times daily.   hydrochlorothiazide 25 MG tablet Commonly known as: HYDRODIURIL Take 1 tablet (25 mg total) by mouth daily.   ibuprofen 600 MG tablet Commonly known as: ADVIL Take 1 tablet (600 mg total) by mouth every 6 (six) hours as needed (mild pain).   insulin aspart protamine- aspart (70-30) 100 UNIT/ML injection Commonly known as: NOVOLOG MIX 70/30 Inject 0.6 mLs (60 Units total) into the skin 2 (two) times daily with a meal. What changed: how much to take   INSULIN SYRINGE .3CC/31GX5/16" 31G X 5/16" 0.3 ML Misc Use with insulin, twice daily   metFORMIN 500 MG 24 hr tablet Commonly known as: GLUCOPHAGE-XR Take 500 mg by mouth 3 (three) times daily. What changed: Another medication with the same name was removed. Continue taking this medication, and follow the directions you see here.   ondansetron 4 MG disintegrating tablet Commonly known as: Zofran ODT Take 1 tablet (4 mg total) by mouth every 8 (eight) hours as needed for nausea or vomiting.   oxyCODONE 5 MG immediate release tablet Commonly known as: Oxy IR/ROXICODONE Take 1-2 tablets (5-10 mg total) by mouth every 8 (eight) hours as needed for severe pain (take 40 minutes before wound vac change).   polyethylene glycol 17 g packet Commonly known as:  MIRALAX / GLYCOLAX Take 17 g by mouth daily as needed for mild constipation or moderate constipation. Also available over-the-counter   psyllium 95 % Pack Commonly known as: HYDROCIL/METAMUCIL Take 1 packet by mouth 2 (two) times daily.   THERAFLU COLD & COUGH PO Take 30 mLs by mouth daily as needed (cold and cough).            Discharge Care Instructions  (From admission, onward)          Start     Ordered   05/12/20 0000  Discharge wound care:       Comments: Wound care dressing changes Tuesday Thursday Saturday per home health, continue wound VAC.   05/12/20 1218           Brief H and P: For complete details please refer to admission H and P, but in brief Carolyn Zimmerman is a 34 y.o. female with a history of morbid obesity, T2DM, and HTN who presented to the ED 8/15 with palpable tender abdominal wall found to have panniculitis with abdominal abscess treated with broad spectrum antibiotics and panniculectomy. SARS-CoV-2 screen found to be positive, and the patient received regeneron monoclonal antibody. Wound care with wound vac is ongoing.  Hospital Course:   Sepsis due to necrotizing fasciitis, panniculitis, and abscesses of abdominal wall and chest wall s/p I&D 8/15 by Dr. Johney Maine: Present on admission -Patient underwent Panniculectomy with excision of abdominal wall abscess and panniculitis, incision and debridement of chest wall abscess x2, incision and debridement of mons pubis abscess x1 on 05/02/20, Dr. Michael Boston.  Postop day #10 -Sepsis physiology resolved at the time of discharge. -Surgical culture grew group C strep and mixed anaerobes.  Patient was placed on IV Unasyn. -Blood cultures remain negative. - Per surgery:  dressing changes to chest wall and left groin, continue wound vac changes MWF while inpatient, has been scheduled for dressing changes and wound VAC changes TTS via home health -Per general surgery exam today, wound looks good and tolerated wound VAC change well, cleared for discharge from surgical standpoint.  She does not need any more antibiotics, has received over 10 total days of appropriate antibiotics while inpatient.  Follow-up scheduled   Covid-19 infection: SARS-CoV-2 PCR screening (incidentally) positive on 8/15 - Can discontinue airborne/contact isolation beginning 8/25 (10 days from positive test) - s/p regeneron 8/18.   Type 2  diabetes mellitus, uncontrolled, with hyperglycemia - hemoglobin A1c 10.4 -Diabetic coordinator was consulted, due to dietary discretions, recommended continue with NovoLog 70/30 60 units twice a day, Metformin 500 mg 3 times daily -Needs better glycemic control.  Essential hypertension  -Continue Norvasc increased dose to 10 mg daily, continue HCTZ.   -Pain likely contributing towards the elevated BP readings. -Continue pain control, outpatient adjustment of antihypertensives  Iron deficiency anemia: Stable postoperative counts. Ferritin, iron, %sat wnl.  -Continue monitoring outpatient  Candida intertrigo: s/p diflucan   Hypokalemia:  -Resolved   Morbid obesity: Estimated body mass index is 44.37 kg/m as calculated from the following:   Height as of this encounter: 5' 7" (1.702 m).   Weight as of this encounter: 128.5 kg.   Day of Discharge S: No acute complaints, hoping to go home today.  No fevers or chills.  Cleared by general surgery.  BP (!) 150/94 (BP Location: Right Arm)   Pulse 80   Temp 98.1 F (36.7 C) (Oral)   Resp 20   Ht 5' 7" (1.702 m)   Wt 128.5 kg  LMP 04/02/2020   SpO2 98%   BMI 44.37 kg/m   Physical Exam: General: Alert and awake oriented x3 not in any acute distress. HEENT: anicteric sclera, pupils reactive to light and accommodation CVS: S1-S2 clear no murmur rubs or gallops Chest: clear to auscultation bilaterally, no wheezing rales or rhonchi Abdomen: soft, nondistended, normal bowel sounds, wound healing, wound VAC on Extremities: no cyanosis, clubbing or edema noted bilaterally Neuro: Cranial nerves II-XII intact, no focal neurological deficits    Get Medicines reviewed and adjusted: Please take all your medications with you for your next visit with your Primary MD  Please request your Primary MD to go over all hospital tests and procedure/radiological results at the follow up. Please ask your Primary MD to get all Hospital records  sent to his/her office.  If you experience worsening of your admission symptoms, develop shortness of breath, life threatening emergency, suicidal or homicidal thoughts you must seek medical attention immediately by calling 911 or calling your MD immediately  if symptoms less severe.  You must read complete instructions/literature along with all the possible adverse reactions/side effects for all the Medicines you take and that have been prescribed to you. Take any new Medicines after you have completely understood and accept all the possible adverse reactions/side effects.   Do not drive when taking pain medications.   Do not take more than prescribed Pain, Sleep and Anxiety Medications  Special Instructions: If you have smoked or chewed Tobacco  in the last 2 yrs please stop smoking, stop any regular Alcohol  and or any Recreational drug use.  Wear Seat belts while driving.  Please note  You were cared for by a hospitalist during your hospital stay. Once you are discharged, your primary care physician will handle any further medical issues. Please note that NO REFILLS for any discharge medications will be authorized once you are discharged, as it is imperative that you return to your primary care physician (or establish a relationship with a primary care physician if you do not have one) for your aftercare needs so that they can reassess your need for medications and monitor your lab values.   The results of significant diagnostics from this hospitalization (including imaging, microbiology, ancillary and laboratory) are listed below for reference.      Procedures/Studies:  CT ABDOMEN PELVIS WO CONTRAST  Result Date: 05/02/2020 CLINICAL DATA:  Right lower abdominal pain with fevers EXAM: CT ABDOMEN AND PELVIS WITHOUT CONTRAST TECHNIQUE: Multidetector CT imaging of the abdomen and pelvis was performed following the standard protocol without IV contrast. COMPARISON:  10/17/2019 FINDINGS:  Lower chest: Lung bases are clear. Minimal pericardial thickening is noted although improved when compared with the prior exam. Heart is mildly enlarged. Hepatobiliary: No focal liver abnormality is seen. Status post cholecystectomy. No biliary dilatation. Pancreas: Unremarkable. No pancreatic ductal dilatation or surrounding inflammatory changes. Spleen: Normal in size without focal abnormality. Adrenals/Urinary Tract: Adrenal glands are within normal limits bilaterally. Kidneys are well visualized bilaterally without renal calculi or obstructive changes. Mild perinephric stranding is noted which may be related to underlying UTI. The bladder is decompressed. Stomach/Bowel: No obstructive or inflammatory changes of the colon are seen. The appendix is within normal limits. No inflammatory changes are noted. Small bowel and stomach are unremarkable. Vascular/Lymphatic: No significant vascular findings are present. No enlarged abdominal or pelvic lymph nodes. Reproductive: Uterus is well visualized and within normal limits. The previously seen left ovarian abnormality has reduced in size consistent with prior tubo-ovarian abscess  with treatment. No definitive ovarian abnormality is seen. Other: Small amount of free fluid is noted in the pelvic cul-de-sac which may be physiologic in nature Musculoskeletal: No acute bony abnormality is noted. In the anterior abdominal wall inferiorly and eccentric to the right, there is significant subcutaneous air and inflammatory change consistent with a focal cellulitis. Minimal fluid is noted. IMPRESSION: Changes most consistent with cellulitis/panniculitis in the right lower abdominal wall. Minimal fluid is noted although a considerable amount of subcutaneous air and emphysema is seen. No drainable collection is seen. Resolution of previously seen left tubo-ovarian abscess. Mild perinephric stranding which may be related to underlying UTI. Clinical correlation is recommended.  Electronically Signed   By: Inez Catalina M.D.   On: 05/02/2020 05:07   DG Chest Portable 1 View  Result Date: 05/02/2020 CLINICAL DATA:  Cough and fevers EXAM: PORTABLE CHEST 1 VIEW COMPARISON:  03/25/2020 FINDINGS: Cardiac shadow remains enlarged. The lungs are well aerated bilaterally. No focal infiltrate or effusion is seen. No bony abnormality is noted. IMPRESSION: No acute abnormality noted. Electronically Signed   By: Inez Catalina M.D.   On: 05/02/2020 05:08       LAB RESULTS: Basic Metabolic Panel: Recent Labs  Lab 05/08/20 0407 05/08/20 0407 05/09/20 0340 05/12/20 0337  NA 138  --   --  131*  K 3.8  --   --  3.8  CL 102  --   --  97*  CO2 27  --   --  25  GLUCOSE 238*  --   --  323*  BUN 8  --   --  12  CREATININE 0.56   < > 0.69 0.70  CALCIUM 8.6*  --   --  8.5*   < > = values in this interval not displayed.   Liver Function Tests: Recent Labs  Lab 05/12/20 0337  AST 16  ALT 16  ALKPHOS 105  BILITOT 0.6  PROT 7.6  ALBUMIN 2.9*   No results for input(s): LIPASE, AMYLASE in the last 168 hours. No results for input(s): AMMONIA in the last 168 hours. CBC: Recent Labs  Lab 05/11/20 0419 05/11/20 0419 05/12/20 0337  WBC 16.9*  --  12.6*  NEUTROABS 10.6*   < > 7.7  HGB 10.9*  --  10.1*  HCT 36.0  --  33.1*  MCV 80.0   < > 80.7  PLT 578*  --  483*   < > = values in this interval not displayed.   Cardiac Enzymes: No results for input(s): CKTOTAL, CKMB, CKMBINDEX, TROPONINI in the last 168 hours. BNP: Invalid input(s): POCBNP CBG: Recent Labs  Lab 05/12/20 0757 05/12/20 1126  GLUCAP 303* 291*       Disposition and Follow-up: Discharge Instructions    Diet Carb Modified   Complete by: As directed    Discharge instructions   Complete by: As directed    It is VERY IMPORTANT that you follow up with a PCP on a regular basis.  Check your blood glucoses before each meal and at bedtime and maintain a log of your readings.  Bring this log with you  when you follow up with your PCP so that he or she can adjust your insulin at your follow up visit.   Discharge wound care:   Complete by: As directed    Wound care dressing changes Tuesday Thursday Saturday per home health, continue wound VAC.   Increase activity slowly   Complete by: As directed  DISPOSITION: Home with home health   [place] Follow up on 05/31/2020.   Specialty: Internal Medicine Why: Monday at 9:20 for your hospital follow up appointment Contact information: [place] [phone]       Michael Boston, MD. Go on 05/31/2020.   Specialty: General Surgery Why: Your appointment is 9/13 at 3pm Please arrive 30 minutes prior to your appointment to check in and fill out paperwork. Bring photo ID and insurance information. Contact information: [address] [place] [address] [phone]        [facility], [facility] Follow up.   Specialty: Home Health Services Why: This is the home health agency that will be following up with you. Contact information: [place]  [address] [phone]                Time coordinating discharge:  37mns   Signed:   REstill CottaM.D. [facility] 05/12/2020, 1:16 PM

## 2020-05-12 NOTE — TOC Transition Note (Signed)
Transition of Care [facility]) - CM/SW Discharge Note   Patient Details  Name: Carolyn Zimmerman MRN: [id number] Date of Birth: 11-Aug-1986  [facility] CM/SW Contact:  Ida Rogue, LCSW Phone Number: 05/12/2020, 10:41 AM   Clinical Narrative:  Patient to discharge home today with support of family. Home Health services have been set up for wound care through [facility], and they will commence on Saturday. Wound vac machine and supplies to go home with patient.  She has both PCP and surgery appointment scheduled for 9/13.  TOC sign off.    Final next level of care: Home w Home Health Services Barriers to Discharge: Barriers Resolved   Patient Goals and CMS Choice Patient states their goals for this hospitalization and ongoing recovery are:: to go home CMS Medicare.gov Compare Post Acute Care list provided to:: Patient    Discharge Placement                       Discharge Plan and Services   Discharge Planning Services: CM Consult                                 Social Determinants of Health (SDOH) Interventions     Readmission Risk Interventions No flowsheet data found.

## 2020-05-12 NOTE — Progress Notes (Signed)
  Pt is being discharged home today. Discharge instructions including medications and follow up appointments given. Pt had no further questions at this time. 

## 2020-05-12 NOTE — Consult Note (Addendum)
WOC Nurse wound follow up Patient is premedicated for pain. Home NPWT (VAC) unit has  Been delivered. I set this up and plugged in to charge. May discharge today, pending HH referral/accpetance.  Nurse will only need to unscrew VAC tubing and connect to home unit if discharged.  PA at bedside and observes wound today.  Wound type:infectious panniculectomy site.  Covid positive, cleared medically.  Measurement: 6 cm x 23 cm x 4 cm  Wound CXK:GYJEH red, bleeds with cleansing.  Drainage (amount, consistency, odor) minimal serosanguinous   Periwound:intact Dressing procedure/placement/frequency: Cleanse with NS.  1 piece black foam.  Sealed with drape and seal immediately achieved.  Patient tolerated well and feels she can manage with PO meds at home.  Will follow.   Maple Hudson MSN, RN, FNP-BC CWON Wound, Ostomy, Continence Nurse Pager [phone]

## 2020-05-12 NOTE — Progress Notes (Signed)
Inpatient Diabetes Program Recommendations  AACE/ADA: New Consensus Statement on Inpatient Glycemic Control (2015)  Target Ranges:  Prepandial:   less than 140 mg/dL      Peak postprandial:   less than 180 mg/dL (1-2 hours)      Critically ill patients:  140 - 180 mg/dL   Lab Results  Component Value Date   GLUCAP 303 (H) 05/12/2020   HGBA1C 10.4 (H) 05/02/2020    Review of Glycemic Control  Results for Carolyn Zimmerman, Carolyn Zimmerman (MRN [id number]) as of 05/12/2020 08:54  Ref. Range 05/11/2020 07:49 05/11/2020 11:48 05/11/2020 16:00 05/11/2020 21:47 05/12/2020 07:57  Glucose-Capillary Latest Ref Range: 70 - 99 mg/dL 335 (H) 456 (H) 256 (H) 281 (H) 303 (H)    Diabetes history: DM 2 Outpatient Diabetes medications: Metformin 500 mg tid (70/30 vial and syringe before she lost insurance) Current orders for Inpatient glycemic control:  Levemir 42 units bid Novolog 0-20 units tid Novolog 10 units tid  Inpatient Diabetes Program Recommendations:    -  Increase Levemir to 48 units bid. -  Increase Novolog meal coverage to 14 units tid.  Dietary indiscretions while inpatient. Due to amount of meal coverage may need 70/30 at time of d/c instead of Lantus as originally planned.  Thanks,  Christena Deem RN, MSN, BC-ADM Inpatient Diabetes Coordinator Team Pager [phone] (8a-5p)

## 2020-05-12 NOTE — Progress Notes (Signed)
[facility] Progress Note  10 Days Post-Op  Subjective: CC-  Vac changed today. Tolerated this much better. Thinks she would be ready for dressing changes at home without IV pain medication.  Objective: Vital signs in last 24 hours: Temp:  [97.8 F (36.6 C)-98.4 F (36.9 C)] 98.1 F (36.7 C) (08/25 0533) Pulse Rate:  [80-86] 80 (08/25 0533) Resp:  [19-20] 20 (08/25 0533) BP: (150-151)/(68-94) 150/94 (08/25 0533) SpO2:  [97 %-98 %] 98 % (08/25 0533) Last BM Date: 05/11/20  Intake/Output from previous day: 08/24 0701 - 08/25 0700 In: 255 [P.O.:255] Out: 100 [Drains:100] Intake/Output this shift: No intake/output data recorded.  PE: Gen:  Alert, NAD, pleasant Pulm: rate and effort normal Abd: wound healing well, tissue is beefy red, no cellulitis or purulent drainage, small amount of induration around proximal/right lateral aspect     Lab Results:  Recent Labs    05/11/20 0419 05/12/20 0337  WBC 16.9* 12.6*  HGB 10.9* 10.1*  HCT 36.0 33.1*  PLT 578* 483*   BMET Recent Labs    05/12/20 0337  NA 131*  K 3.8  CL 97*  CO2 25  GLUCOSE 323*  BUN 12  CREATININE 0.70  CALCIUM 8.5*   PT/INR No results for input(s): LABPROT, INR in the last 72 hours. CMP     Component Value Date/Time   NA 131 (L) 05/12/2020 0337   K 3.8 05/12/2020 0337   CL 97 (L) 05/12/2020 0337   CO2 25 05/12/2020 0337   GLUCOSE 323 (H) 05/12/2020 0337   BUN 12 05/12/2020 0337   CREATININE 0.70 05/12/2020 0337   CALCIUM 8.5 (L) 05/12/2020 0337   PROT 7.6 05/12/2020 0337   ALBUMIN 2.9 (L) 05/12/2020 0337   AST 16 05/12/2020 0337   ALT 16 05/12/2020 0337   ALKPHOS 105 05/12/2020 0337   BILITOT 0.6 05/12/2020 0337   GFRNONAA >60 05/12/2020 0337   GFRAA >60 05/12/2020 0337   Lipase     Component Value Date/Time   LIPASE 28 03/25/2020 0746       Studies/Results: No results found.  Anti-infectives: Anti-infectives (From admission, onward)   Start     Dose/Rate  Route Frequency Ordered Stop   05/07/20 1400  Ampicillin-Sulbactam (UNASYN) 3 g in sodium chloride 0.9 % 100 mL IVPB        3 g 200 mL/hr over 30 Minutes Intravenous Every 6 hours 05/07/20 1317     05/03/20 1700  fluconazole (DIFLUCAN) IVPB 100 mg  Status:  Discontinued        100 mg 50 mL/hr over 60 Minutes Intravenous Every 24 hours 05/02/20 1603 05/03/20 1040   05/03/20 1045  metroNIDAZOLE (FLAGYL) IVPB 500 mg  Status:  Discontinued        500 mg 100 mL/hr over 60 Minutes Intravenous Every 8 hours 05/03/20 1039 05/07/20 1317   05/03/20 1045  ceFEPIme (MAXIPIME) 2 g in sodium chloride 0.9 % 100 mL IVPB  Status:  Discontinued        2 g 200 mL/hr over 30 Minutes Intravenous Every 8 hours 05/03/20 1040 05/07/20 1317   05/03/20 1000  meropenem (MERREM) 1 g in sodium chloride 0.9 % 100 mL IVPB  Status:  Discontinued        1 g 200 mL/hr over 30 Minutes Intravenous Every 8 hours 05/03/20 0951 05/03/20 1040   05/02/20 2000  vancomycin (VANCOREADY) IVPB 1250 mg/250 mL  Status:  Discontinued        1,250 mg 166.7  mL/hr over 90 Minutes Intravenous Every 12 hours 05/02/20 0541 05/02/20 1022   05/02/20 1700  metroNIDAZOLE (FLAGYL) IVPB 500 mg  Status:  Discontinued        500 mg 100 mL/hr over 60 Minutes Intravenous Every 8 hours 05/02/20 1603 05/03/20 0930   05/02/20 1615  vancomycin (VANCOCIN) IVPB 1000 mg/200 mL premix  Status:  Discontinued        1,000 mg 200 mL/hr over 60 Minutes Intravenous  Once 05/02/20 1603 05/02/20 1613   05/02/20 1615  fluconazole (DIFLUCAN) IVPB 200 mg        200 mg 100 mL/hr over 60 Minutes Intravenous  Once 05/02/20 1603 05/02/20 1744   05/02/20 1400  vancomycin (VANCOREADY) IVPB 1250 mg/250 mL  Status:  Discontinued        1,250 mg 166.7 mL/hr over 90 Minutes Intravenous Every 8 hours 05/02/20 1022 05/04/20 1453   05/02/20 1315  clindamycin (CLEOCIN) IVPB 900 mg  Status:  Discontinued        900 mg 100 mL/hr over 30 Minutes Intravenous On call to O.R. 05/02/20  0952 05/02/20 1527   05/02/20 1200  meropenem (MERREM) 1 g in sodium chloride 0.9 % 100 mL IVPB  Status:  Discontinued        1 g 200 mL/hr over 30 Minutes Intravenous Every 8 hours 05/02/20 1019 05/02/20 1603   05/02/20 1030  vancomycin (VANCOREADY) IVPB 1500 mg/300 mL  Status:  Discontinued        1,500 mg 150 mL/hr over 120 Minutes Intravenous NOW 05/02/20 1019 05/02/20 1020   05/02/20 0544  vancomycin (VANCOCIN) 1000 MG powder       Note to Pharmacy: Gordy Savers   : cabinet override      05/02/20 0544 05/02/20 0611   05/02/20 0530  piperacillin-tazobactam (ZOSYN) IVPB 3.375 g        3.375 g 100 mL/hr over 30 Minutes Intravenous  Once 05/02/20 0519 05/02/20 0633   05/02/20 0530  clindamycin (CLEOCIN) IVPB 600 mg        600 mg 100 mL/hr over 30 Minutes Intravenous  Once 05/02/20 0519 05/02/20 0730   05/02/20 0530  vancomycin (VANCOREADY) IVPB 2000 mg/400 mL  Status:  Discontinued        2,000 mg 200 mL/hr over 120 Minutes Intravenous  Once 05/02/20 0528 05/04/20 1447       Assessment/Plan Covid positive Sepsis due to necrotizing fasciitis/abdominal wall abscess -WBC 30.7>>41.5>>32.5>>16.5(8/17)>> 13.5>>15.0>>14.7>>15.1>>12.6 Hyperglycemia with uncontrolled type 2 diabetes Uncontrolled hypertension Microcytic anemia Morbid obesity BMI 44.36 Candidal intertrigo, POA Hx noncompliance   Panniculitis with abdominal wall abscess, chest wall abscess x2, left mons pubis/groin abscess, hidradenitis suppurativa Panniculectomy with excision of abdominal wall abscess and panniculitis, incision and debridement of chest wall abscess x2, incision and debridement of mons pubis abscess x1, 05/02/20, Dr. Karie Soda -POD #10  - culture STREP GROUP C - wound vac MWF to large lower abdominal wound - BID wet to dry dressing changes to bilateral chest and left groin wounds  FEN: IVF KVO/heart healthy diet  ID: Clindamycin x 1,05/02/20; Diflucan times 18/15/21; meropenem x 1  8/15; maxipime/Flagyl 8/16-8/20;     Unasyn 8/20>> day 6   Plan:  Wound looks great, tolerated vac change well today, ok for discharge from surgical standpoint. Home health is arranged for vac changes T/Th/Sat. She does not need any more antibiotics, she has received over 10 total days of appropriate abx in the hospital. I will send rx for pain medication to  her pharmacy. Follow up info on AVS.   LOS: 10 days    Franne Forts, [facility] 05/12/2020, 9:18 AM Please see Amion for pager number during day hours 7:00am-4:30pm

## 2020-05-15 DIAGNOSIS — E1165 Type 2 diabetes mellitus with hyperglycemia: Secondary | ICD-10-CM | POA: Diagnosis not present

## 2020-05-15 DIAGNOSIS — Z9181 History of falling: Secondary | ICD-10-CM | POA: Diagnosis not present

## 2020-05-15 DIAGNOSIS — L02215 Cutaneous abscess of perineum: Secondary | ICD-10-CM | POA: Diagnosis not present

## 2020-05-15 DIAGNOSIS — Z48817 Encounter for surgical aftercare following surgery on the skin and subcutaneous tissue: Secondary | ICD-10-CM | POA: Diagnosis not present

## 2020-05-15 DIAGNOSIS — L02211 Cutaneous abscess of abdominal wall: Secondary | ICD-10-CM | POA: Diagnosis not present

## 2020-05-15 DIAGNOSIS — L02214 Cutaneous abscess of groin: Secondary | ICD-10-CM | POA: Diagnosis not present

## 2020-05-15 DIAGNOSIS — Z6841 Body Mass Index (BMI) 40.0 and over, adult: Secondary | ICD-10-CM | POA: Diagnosis not present

## 2020-05-15 DIAGNOSIS — I10 Essential (primary) hypertension: Secondary | ICD-10-CM | POA: Diagnosis not present

## 2020-05-15 DIAGNOSIS — Z794 Long term (current) use of insulin: Secondary | ICD-10-CM | POA: Diagnosis not present

## 2020-05-15 DIAGNOSIS — Z4801 Encounter for change or removal of surgical wound dressing: Secondary | ICD-10-CM | POA: Diagnosis not present

## 2020-05-15 DIAGNOSIS — L732 Hidradenitis suppurativa: Secondary | ICD-10-CM | POA: Diagnosis not present

## 2020-05-15 DIAGNOSIS — M726 Necrotizing fasciitis: Secondary | ICD-10-CM | POA: Diagnosis not present

## 2020-05-15 DIAGNOSIS — D509 Iron deficiency anemia, unspecified: Secondary | ICD-10-CM | POA: Diagnosis not present

## 2020-05-15 DIAGNOSIS — Z791 Long term (current) use of non-steroidal anti-inflammatories (NSAID): Secondary | ICD-10-CM | POA: Diagnosis not present

## 2020-05-15 DIAGNOSIS — U071 COVID-19: Secondary | ICD-10-CM | POA: Diagnosis not present

## 2020-05-18 DIAGNOSIS — L732 Hidradenitis suppurativa: Secondary | ICD-10-CM | POA: Diagnosis not present

## 2020-05-18 DIAGNOSIS — L02215 Cutaneous abscess of perineum: Secondary | ICD-10-CM | POA: Diagnosis not present

## 2020-05-18 DIAGNOSIS — Z9181 History of falling: Secondary | ICD-10-CM | POA: Diagnosis not present

## 2020-05-18 DIAGNOSIS — Z794 Long term (current) use of insulin: Secondary | ICD-10-CM | POA: Diagnosis not present

## 2020-05-18 DIAGNOSIS — L02211 Cutaneous abscess of abdominal wall: Secondary | ICD-10-CM | POA: Diagnosis not present

## 2020-05-18 DIAGNOSIS — U071 COVID-19: Secondary | ICD-10-CM | POA: Diagnosis not present

## 2020-05-18 DIAGNOSIS — E1165 Type 2 diabetes mellitus with hyperglycemia: Secondary | ICD-10-CM | POA: Diagnosis not present

## 2020-05-18 DIAGNOSIS — D509 Iron deficiency anemia, unspecified: Secondary | ICD-10-CM | POA: Diagnosis not present

## 2020-05-18 DIAGNOSIS — L02214 Cutaneous abscess of groin: Secondary | ICD-10-CM | POA: Diagnosis not present

## 2020-05-18 DIAGNOSIS — M726 Necrotizing fasciitis: Secondary | ICD-10-CM | POA: Diagnosis not present

## 2020-05-18 DIAGNOSIS — Z791 Long term (current) use of non-steroidal anti-inflammatories (NSAID): Secondary | ICD-10-CM | POA: Diagnosis not present

## 2020-05-18 DIAGNOSIS — I10 Essential (primary) hypertension: Secondary | ICD-10-CM | POA: Diagnosis not present

## 2020-05-18 DIAGNOSIS — Z6841 Body Mass Index (BMI) 40.0 and over, adult: Secondary | ICD-10-CM | POA: Diagnosis not present

## 2020-05-18 DIAGNOSIS — Z4801 Encounter for change or removal of surgical wound dressing: Secondary | ICD-10-CM | POA: Diagnosis not present

## 2020-05-18 DIAGNOSIS — Z48817 Encounter for surgical aftercare following surgery on the skin and subcutaneous tissue: Secondary | ICD-10-CM | POA: Diagnosis not present

## 2020-05-19 DIAGNOSIS — T8189XA Other complications of procedures, not elsewhere classified, initial encounter: Secondary | ICD-10-CM | POA: Diagnosis not present

## 2020-05-20 DIAGNOSIS — T8189XA Other complications of procedures, not elsewhere classified, initial encounter: Secondary | ICD-10-CM | POA: Diagnosis not present

## 2020-05-20 DIAGNOSIS — L02211 Cutaneous abscess of abdominal wall: Secondary | ICD-10-CM | POA: Diagnosis not present

## 2020-05-20 DIAGNOSIS — Z9181 History of falling: Secondary | ICD-10-CM | POA: Diagnosis not present

## 2020-05-20 DIAGNOSIS — Z791 Long term (current) use of non-steroidal anti-inflammatories (NSAID): Secondary | ICD-10-CM | POA: Diagnosis not present

## 2020-05-20 DIAGNOSIS — Z48817 Encounter for surgical aftercare following surgery on the skin and subcutaneous tissue: Secondary | ICD-10-CM | POA: Diagnosis not present

## 2020-05-20 DIAGNOSIS — Z6841 Body Mass Index (BMI) 40.0 and over, adult: Secondary | ICD-10-CM | POA: Diagnosis not present

## 2020-05-20 DIAGNOSIS — E1165 Type 2 diabetes mellitus with hyperglycemia: Secondary | ICD-10-CM | POA: Diagnosis not present

## 2020-05-20 DIAGNOSIS — D509 Iron deficiency anemia, unspecified: Secondary | ICD-10-CM | POA: Diagnosis not present

## 2020-05-20 DIAGNOSIS — U071 COVID-19: Secondary | ICD-10-CM | POA: Diagnosis not present

## 2020-05-20 DIAGNOSIS — L02215 Cutaneous abscess of perineum: Secondary | ICD-10-CM | POA: Diagnosis not present

## 2020-05-20 DIAGNOSIS — I10 Essential (primary) hypertension: Secondary | ICD-10-CM | POA: Diagnosis not present

## 2020-05-20 DIAGNOSIS — L732 Hidradenitis suppurativa: Secondary | ICD-10-CM | POA: Diagnosis not present

## 2020-05-20 DIAGNOSIS — Z4801 Encounter for change or removal of surgical wound dressing: Secondary | ICD-10-CM | POA: Diagnosis not present

## 2020-05-20 DIAGNOSIS — L02214 Cutaneous abscess of groin: Secondary | ICD-10-CM | POA: Diagnosis not present

## 2020-05-20 DIAGNOSIS — Z794 Long term (current) use of insulin: Secondary | ICD-10-CM | POA: Diagnosis not present

## 2020-05-20 DIAGNOSIS — M726 Necrotizing fasciitis: Secondary | ICD-10-CM | POA: Diagnosis not present

## 2020-05-21 DIAGNOSIS — T8189XA Other complications of procedures, not elsewhere classified, initial encounter: Secondary | ICD-10-CM | POA: Diagnosis not present

## 2020-05-22 DIAGNOSIS — I10 Essential (primary) hypertension: Secondary | ICD-10-CM | POA: Diagnosis not present

## 2020-05-22 DIAGNOSIS — T8189XA Other complications of procedures, not elsewhere classified, initial encounter: Secondary | ICD-10-CM | POA: Diagnosis not present

## 2020-05-22 DIAGNOSIS — Z9181 History of falling: Secondary | ICD-10-CM | POA: Diagnosis not present

## 2020-05-22 DIAGNOSIS — M726 Necrotizing fasciitis: Secondary | ICD-10-CM | POA: Diagnosis not present

## 2020-05-22 DIAGNOSIS — E1165 Type 2 diabetes mellitus with hyperglycemia: Secondary | ICD-10-CM | POA: Diagnosis not present

## 2020-05-22 DIAGNOSIS — Z48817 Encounter for surgical aftercare following surgery on the skin and subcutaneous tissue: Secondary | ICD-10-CM | POA: Diagnosis not present

## 2020-05-22 DIAGNOSIS — L02215 Cutaneous abscess of perineum: Secondary | ICD-10-CM | POA: Diagnosis not present

## 2020-05-22 DIAGNOSIS — Z794 Long term (current) use of insulin: Secondary | ICD-10-CM | POA: Diagnosis not present

## 2020-05-22 DIAGNOSIS — D509 Iron deficiency anemia, unspecified: Secondary | ICD-10-CM | POA: Diagnosis not present

## 2020-05-22 DIAGNOSIS — Z791 Long term (current) use of non-steroidal anti-inflammatories (NSAID): Secondary | ICD-10-CM | POA: Diagnosis not present

## 2020-05-22 DIAGNOSIS — Z6841 Body Mass Index (BMI) 40.0 and over, adult: Secondary | ICD-10-CM | POA: Diagnosis not present

## 2020-05-22 DIAGNOSIS — U071 COVID-19: Secondary | ICD-10-CM | POA: Diagnosis not present

## 2020-05-22 DIAGNOSIS — L02211 Cutaneous abscess of abdominal wall: Secondary | ICD-10-CM | POA: Diagnosis not present

## 2020-05-22 DIAGNOSIS — L732 Hidradenitis suppurativa: Secondary | ICD-10-CM | POA: Diagnosis not present

## 2020-05-22 DIAGNOSIS — L02214 Cutaneous abscess of groin: Secondary | ICD-10-CM | POA: Diagnosis not present

## 2020-05-22 DIAGNOSIS — Z4801 Encounter for change or removal of surgical wound dressing: Secondary | ICD-10-CM | POA: Diagnosis not present

## 2020-05-23 DIAGNOSIS — T8189XA Other complications of procedures, not elsewhere classified, initial encounter: Secondary | ICD-10-CM | POA: Diagnosis not present

## 2020-05-24 DIAGNOSIS — Z6841 Body Mass Index (BMI) 40.0 and over, adult: Secondary | ICD-10-CM | POA: Diagnosis not present

## 2020-05-24 DIAGNOSIS — Z794 Long term (current) use of insulin: Secondary | ICD-10-CM | POA: Diagnosis not present

## 2020-05-24 DIAGNOSIS — E1165 Type 2 diabetes mellitus with hyperglycemia: Secondary | ICD-10-CM | POA: Diagnosis not present

## 2020-05-24 DIAGNOSIS — L02215 Cutaneous abscess of perineum: Secondary | ICD-10-CM | POA: Diagnosis not present

## 2020-05-24 DIAGNOSIS — D509 Iron deficiency anemia, unspecified: Secondary | ICD-10-CM | POA: Diagnosis not present

## 2020-05-24 DIAGNOSIS — M726 Necrotizing fasciitis: Secondary | ICD-10-CM | POA: Diagnosis not present

## 2020-05-24 DIAGNOSIS — T8189XA Other complications of procedures, not elsewhere classified, initial encounter: Secondary | ICD-10-CM | POA: Diagnosis not present

## 2020-05-24 DIAGNOSIS — L02214 Cutaneous abscess of groin: Secondary | ICD-10-CM | POA: Diagnosis not present

## 2020-05-24 DIAGNOSIS — L732 Hidradenitis suppurativa: Secondary | ICD-10-CM | POA: Diagnosis not present

## 2020-05-24 DIAGNOSIS — L02211 Cutaneous abscess of abdominal wall: Secondary | ICD-10-CM | POA: Diagnosis not present

## 2020-05-24 DIAGNOSIS — Z9181 History of falling: Secondary | ICD-10-CM | POA: Diagnosis not present

## 2020-05-24 DIAGNOSIS — U071 COVID-19: Secondary | ICD-10-CM | POA: Diagnosis not present

## 2020-05-24 DIAGNOSIS — Z48817 Encounter for surgical aftercare following surgery on the skin and subcutaneous tissue: Secondary | ICD-10-CM | POA: Diagnosis not present

## 2020-05-24 DIAGNOSIS — I10 Essential (primary) hypertension: Secondary | ICD-10-CM | POA: Diagnosis not present

## 2020-05-24 DIAGNOSIS — Z4801 Encounter for change or removal of surgical wound dressing: Secondary | ICD-10-CM | POA: Diagnosis not present

## 2020-05-24 DIAGNOSIS — Z791 Long term (current) use of non-steroidal anti-inflammatories (NSAID): Secondary | ICD-10-CM | POA: Diagnosis not present

## 2020-05-25 DIAGNOSIS — T8189XA Other complications of procedures, not elsewhere classified, initial encounter: Secondary | ICD-10-CM | POA: Diagnosis not present

## 2020-05-26 DIAGNOSIS — D509 Iron deficiency anemia, unspecified: Secondary | ICD-10-CM | POA: Diagnosis not present

## 2020-05-26 DIAGNOSIS — L02211 Cutaneous abscess of abdominal wall: Secondary | ICD-10-CM | POA: Diagnosis not present

## 2020-05-26 DIAGNOSIS — Z48817 Encounter for surgical aftercare following surgery on the skin and subcutaneous tissue: Secondary | ICD-10-CM | POA: Diagnosis not present

## 2020-05-26 DIAGNOSIS — Z9181 History of falling: Secondary | ICD-10-CM | POA: Diagnosis not present

## 2020-05-26 DIAGNOSIS — Z794 Long term (current) use of insulin: Secondary | ICD-10-CM | POA: Diagnosis not present

## 2020-05-26 DIAGNOSIS — L02215 Cutaneous abscess of perineum: Secondary | ICD-10-CM | POA: Diagnosis not present

## 2020-05-26 DIAGNOSIS — Z6841 Body Mass Index (BMI) 40.0 and over, adult: Secondary | ICD-10-CM | POA: Diagnosis not present

## 2020-05-26 DIAGNOSIS — T8189XA Other complications of procedures, not elsewhere classified, initial encounter: Secondary | ICD-10-CM | POA: Diagnosis not present

## 2020-05-26 DIAGNOSIS — L732 Hidradenitis suppurativa: Secondary | ICD-10-CM | POA: Diagnosis not present

## 2020-05-26 DIAGNOSIS — Z791 Long term (current) use of non-steroidal anti-inflammatories (NSAID): Secondary | ICD-10-CM | POA: Diagnosis not present

## 2020-05-26 DIAGNOSIS — E1165 Type 2 diabetes mellitus with hyperglycemia: Secondary | ICD-10-CM | POA: Diagnosis not present

## 2020-05-26 DIAGNOSIS — L02214 Cutaneous abscess of groin: Secondary | ICD-10-CM | POA: Diagnosis not present

## 2020-05-26 DIAGNOSIS — Z4801 Encounter for change or removal of surgical wound dressing: Secondary | ICD-10-CM | POA: Diagnosis not present

## 2020-05-26 DIAGNOSIS — I10 Essential (primary) hypertension: Secondary | ICD-10-CM | POA: Diagnosis not present

## 2020-05-26 DIAGNOSIS — U071 COVID-19: Secondary | ICD-10-CM | POA: Diagnosis not present

## 2020-05-26 DIAGNOSIS — M726 Necrotizing fasciitis: Secondary | ICD-10-CM | POA: Diagnosis not present

## 2020-05-27 DIAGNOSIS — T8189XA Other complications of procedures, not elsewhere classified, initial encounter: Secondary | ICD-10-CM | POA: Diagnosis not present

## 2020-05-28 DIAGNOSIS — Z4801 Encounter for change or removal of surgical wound dressing: Secondary | ICD-10-CM | POA: Diagnosis not present

## 2020-05-28 DIAGNOSIS — Z794 Long term (current) use of insulin: Secondary | ICD-10-CM | POA: Diagnosis not present

## 2020-05-28 DIAGNOSIS — T8189XA Other complications of procedures, not elsewhere classified, initial encounter: Secondary | ICD-10-CM | POA: Diagnosis not present

## 2020-05-28 DIAGNOSIS — L02211 Cutaneous abscess of abdominal wall: Secondary | ICD-10-CM | POA: Diagnosis not present

## 2020-05-28 DIAGNOSIS — D509 Iron deficiency anemia, unspecified: Secondary | ICD-10-CM | POA: Diagnosis not present

## 2020-05-28 DIAGNOSIS — L02214 Cutaneous abscess of groin: Secondary | ICD-10-CM | POA: Diagnosis not present

## 2020-05-28 DIAGNOSIS — Z6841 Body Mass Index (BMI) 40.0 and over, adult: Secondary | ICD-10-CM | POA: Diagnosis not present

## 2020-05-28 DIAGNOSIS — Z48817 Encounter for surgical aftercare following surgery on the skin and subcutaneous tissue: Secondary | ICD-10-CM | POA: Diagnosis not present

## 2020-05-28 DIAGNOSIS — L02215 Cutaneous abscess of perineum: Secondary | ICD-10-CM | POA: Diagnosis not present

## 2020-05-28 DIAGNOSIS — E1165 Type 2 diabetes mellitus with hyperglycemia: Secondary | ICD-10-CM | POA: Diagnosis not present

## 2020-05-28 DIAGNOSIS — U071 COVID-19: Secondary | ICD-10-CM | POA: Diagnosis not present

## 2020-05-28 DIAGNOSIS — M726 Necrotizing fasciitis: Secondary | ICD-10-CM | POA: Diagnosis not present

## 2020-05-28 DIAGNOSIS — Z791 Long term (current) use of non-steroidal anti-inflammatories (NSAID): Secondary | ICD-10-CM | POA: Diagnosis not present

## 2020-05-28 DIAGNOSIS — I10 Essential (primary) hypertension: Secondary | ICD-10-CM | POA: Diagnosis not present

## 2020-05-28 DIAGNOSIS — Z9181 History of falling: Secondary | ICD-10-CM | POA: Diagnosis not present

## 2020-05-28 DIAGNOSIS — L732 Hidradenitis suppurativa: Secondary | ICD-10-CM | POA: Diagnosis not present

## 2020-05-29 DIAGNOSIS — T8189XA Other complications of procedures, not elsewhere classified, initial encounter: Secondary | ICD-10-CM | POA: Diagnosis not present

## 2020-05-30 DIAGNOSIS — T8189XA Other complications of procedures, not elsewhere classified, initial encounter: Secondary | ICD-10-CM | POA: Diagnosis not present

## 2020-05-31 ENCOUNTER — Other Ambulatory Visit: Payer: Self-pay

## 2020-05-31 ENCOUNTER — Ambulatory Visit (INDEPENDENT_AMBULATORY_CARE_PROVIDER_SITE_OTHER): Payer: BC Managed Care – PPO | Admitting: Family Medicine

## 2020-05-31 ENCOUNTER — Encounter: Payer: Self-pay | Admitting: Family Medicine

## 2020-05-31 VITALS — BP 140/90 | HR 102 | Temp 97.5°F | Resp 18 | Ht 67.0 in | Wt 284.4 lb

## 2020-05-31 DIAGNOSIS — L02211 Cutaneous abscess of abdominal wall: Secondary | ICD-10-CM

## 2020-05-31 DIAGNOSIS — Z09 Encounter for follow-up examination after completed treatment for conditions other than malignant neoplasm: Secondary | ICD-10-CM

## 2020-05-31 DIAGNOSIS — R1031 Right lower quadrant pain: Secondary | ICD-10-CM | POA: Diagnosis not present

## 2020-05-31 DIAGNOSIS — S31109A Unspecified open wound of abdominal wall, unspecified quadrant without penetration into peritoneal cavity, initial encounter: Secondary | ICD-10-CM | POA: Diagnosis not present

## 2020-05-31 DIAGNOSIS — Z794 Long term (current) use of insulin: Secondary | ICD-10-CM | POA: Diagnosis not present

## 2020-05-31 DIAGNOSIS — R1032 Left lower quadrant pain: Secondary | ICD-10-CM

## 2020-05-31 DIAGNOSIS — G8929 Other chronic pain: Secondary | ICD-10-CM

## 2020-05-31 DIAGNOSIS — Z7689 Persons encountering health services in other specified circumstances: Secondary | ICD-10-CM

## 2020-05-31 DIAGNOSIS — E1165 Type 2 diabetes mellitus with hyperglycemia: Secondary | ICD-10-CM

## 2020-05-31 DIAGNOSIS — R739 Hyperglycemia, unspecified: Secondary | ICD-10-CM

## 2020-05-31 DIAGNOSIS — S31109D Unspecified open wound of abdominal wall, unspecified quadrant without penetration into peritoneal cavity, subsequent encounter: Secondary | ICD-10-CM

## 2020-05-31 DIAGNOSIS — T8189XA Other complications of procedures, not elsewhere classified, initial encounter: Secondary | ICD-10-CM | POA: Diagnosis not present

## 2020-05-31 DIAGNOSIS — R7309 Other abnormal glucose: Secondary | ICD-10-CM

## 2020-05-31 LAB — GLUCOSE, POCT (MANUAL RESULT ENTRY): POC Glucose: 370 mg/dl — AB (ref 70–99)

## 2020-05-31 MED ORDER — BLOOD GLUCOSE METER KIT: PACK | 0 refills | Status: DC

## 2020-05-31 MED ORDER — INSULIN ASPART PROT & ASPART (70-30 MIX) 100 UNIT/ML ~~LOC~~ SUSP: 60.0000 [IU] | Freq: Two times a day (BID) | SUBCUTANEOUS | 11 refills | Status: DC

## 2020-05-31 NOTE — Progress Notes (Signed)
[facility]   New Patient--Hospital Follow Up--Establish Care  Subjective:  Patient ID: Carolyn Zimmerman, female    DOB: [birth year]  Age: 34 y.o. MRN: [id number]  CC:  Chief Complaint  Patient presents with  . Establish Care    Pt states she just have concerns about her general health.Marland Kitchen    HPI Carolyn Zimmerman is a 34 year old female who presents for Hospital Follow Up and to Establish Care today.     Patient Active Problem List   Diagnosis Date Noted  . Panniuclitis with abdominal wall abscess s/p panniculectomy 05/02/2020 05/02/2020  . Obesity, Class III, BMI 40-49.9 (morbid obesity) ([place]) 05/02/2020  . COVID-19 virus infection 05/02/2020  . Panniculitis abdominal wall 05/02/2020  . Necrotizing fasciitis ([place]) 05/02/2020  . Sepsis ([place]) 05/02/2020  . Hidradenitis suppurativa of axillae,chest wall, groins 05/02/2020  . Type 2 diabetes mellitus with hyperglycemia ([place]) 10/18/2019  . TOA (tubo-ovarian abscess) 10/17/2019  . Elevated LFTs 09/16/2018  . DM (diabetes mellitus), type 2, uncontrolled ([place]) 09/16/2018  . Uncontrolled hypertension 09/16/2018  . Increased frequency of urination 09/16/2018  . Bile duct stricture 07/30/2018  . Chest wall abscesses x 2 s/p I&D 05/02/2020 06/15/2017  . Hyperglycemia 06/15/2017  . Hx of medication noncompliance     Current Status: This will be Carolyn Zimmerman's initial office visit with me. She was previously seeing Dr. Horald Pollen, MD for her PCP needs. Since her last office visit, she has had Hospital Admission from 05/02/2020-05/12/2020 for Panniculitis and Abscess. She did undergo a Panniculectomy and discharged home with wound vac and JP drain. She currently has abdominal wound changed 3 times a week by Home Health Nurse. Wound is healing healing well. Today she continues to have chronic abdominal pain. She does continue to have intermittent nausea. Denies any other GI problems such as vomiting,  diarrhea, and constipation. Her blood pressures are elevated r/t to her abdominal pain. She denies visual changes, chest pain, cough, shortness of breath, heart palpitations, and falls. She has occasional headaches and dizziness with position changes. Denies severe headaches, confusion, seizures, double vision, and blurred vision, and vomiting. She has not been checking his blood glucose levels regularly. She denies fatigue, frequent urination, blurred vision, excessive hunger, excessive thirst, weight gain, weight loss, and poor wound healing. She continues to check her feet regularly. She has no reports of blood in stools, dysuria and hematuria. She was also positive for COVID-19 at the time of admission. She has follow up with Dr. Johney Maine today. She denies fevers, chills, fatigue, recent infections, weight loss, and night sweats. She has not had any headaches, visual changes, dizziness, and falls. No chest pain, heart palpitations, cough and shortness of breath reported.  No depression or anxiety. She is taking all medications as prescribed. She denies pain today.  Past Medical History:  Diagnosis Date  . Abscess of chest wall 06/15/2017  . Coronavirus infection 05/02/2020  . Diabetes mellitus   . Hypertension   . Jaundice   . Obesity   . TOA (tubo-ovarian abscess) 09/2019    Past Surgical History:  Procedure Laterality Date  . CHOLECYSTECTOMY    . IRRIGATION AND DEBRIDEMENT ABSCESS N/A 05/02/2020   Procedure: IRRIGATION AND DEBRIDEMENT ABDOMINAL and CHEST WALL NECROTIZING FASCITITS;  Surgeon: Michael Boston, MD;  Location: [facility];  Service: General;  Laterality: N/A;  . liver stent      Family History  Problem Relation Age of Onset  . Hypertension Mother   .  Diabetes Father   . Hypertension Father     Social History   Socioeconomic History  . Marital status: Single    Spouse name: Not on file  . Number of children: Not on file  . Years of education: Not on file  . Highest education  level: Not on file  Occupational History  . Not on file  Tobacco Use  . Smoking status: Current Some Day Smoker    Types: Cigarettes  . Smokeless tobacco: Never Used  Vaping Use  . Vaping Use: Former  Substance and Sexual Activity  . Alcohol use: Not Currently    Comment: occ  . Drug use: No  . Sexual activity: Not on file  Other Topics Concern  . Not on file  Social History Narrative  . Not on file   Social Determinants of Health   Financial Resource Strain:   . Difficulty of Paying Living Expenses: Not on file  Food Insecurity:   . Worried About Charity fundraiser in the Last Year: Not on file  . Ran Out of Food in the Last Year: Not on file  Transportation Needs:   . Lack of Transportation (Medical): Not on file  . Lack of Transportation (Non-Medical): Not on file  Physical Activity:   . Days of Exercise per Week: Not on file  . Minutes of Exercise per Session: Not on file  Stress:   . Feeling of Stress : Not on file  Social Connections:   . Frequency of Communication with Friends and Family: Not on file  . Frequency of Social Gatherings with Friends and Family: Not on file  . Attends Religious Services: Not on file  . Active Member of Clubs or Organizations: Not on file  . Attends Archivist Meetings: Not on file  . Marital Status: Not on file  Intimate Partner Violence:   . Fear of Current or Ex-Partner: Not on file  . Emotionally Abused: Not on file  . Physically Abused: Not on file  . Sexually Abused: Not on file    Outpatient Medications Prior to Visit  Medication Sig Dispense Refill  . acetaminophen (TYLENOL) 500 MG tablet Take 2 tablets (1,000 mg total) by mouth every 8 (eight) hours as needed for mild pain. 30 tablet 0  . amLODipine (NORVASC) 10 MG tablet Take 1 tablet (10 mg total) by mouth daily. 30 tablet 3  . gabapentin (NEURONTIN) 400 MG capsule Take 1 capsule (400 mg total) by mouth 3 (three) times daily. 90 capsule 0  .  hydrochlorothiazide (HYDRODIURIL) 25 MG tablet Take 1 tablet (25 mg total) by mouth daily. 30 tablet 1  . ibuprofen (ADVIL) 600 MG tablet Take 1 tablet (600 mg total) by mouth every 6 (six) hours as needed (mild pain). 30 tablet 3  . Insulin Syringe-Needle U-100 (INSULIN SYRINGE .3CC/31GX5/16") 31G X 5/16" 0.3 ML MISC Use with insulin, twice daily 100 each 3  . metFORMIN (GLUCOPHAGE-XR) 500 MG 24 hr tablet Take 500 mg by mouth 3 (three) times daily.    . ondansetron (ZOFRAN ODT) 4 MG disintegrating tablet Take 1 tablet (4 mg total) by mouth every 8 (eight) hours as needed for nausea or vomiting. 20 tablet 0  . oxyCODONE (OXY IR/ROXICODONE) 5 MG immediate release tablet Take 1-2 tablets (5-10 mg total) by mouth every 8 (eight) hours as needed for severe pain (take 40 minutes before wound vac change). 25 tablet 0  . polyethylene glycol (MIRALAX / GLYCOLAX) 17 g packet Take 17  g by mouth daily as needed for mild constipation or moderate constipation. Also available over-the-counter 30 each 0  . blood glucose meter kit and supplies Dispense based on patient and insurance preference. Use up to four times daily as directed. (FOR ICD-9 250.00, 250.01). 1 each 0  . albuterol (PROVENTIL HFA;VENTOLIN HFA) 108 (90 Base) MCG/ACT inhaler Inhale 2 puffs into the lungs every 4 (four) hours as needed for wheezing or shortness of breath. (Patient not taking: Reported on 10/18/2019) 1 Inhaler 0  . Phenylephrine-Pheniramine-DM (THERAFLU COLD & COUGH PO) Take 30 mLs by mouth daily as needed (cold and cough). (Patient not taking: Reported on 05/31/2020)    . psyllium (HYDROCIL/METAMUCIL) 95 % PACK Take 1 packet by mouth 2 (two) times daily. (Patient not taking: Reported on 05/31/2020) 240 each 1  . insulin aspart protamine- aspart (NOVOLOG MIX 70/30) (70-30) 100 UNIT/ML injection Inject 0.6 mLs (60 Units total) into the skin 2 (two) times daily with a meal. (Patient not taking: Reported on 05/31/2020) 36 mL 3   No  facility-administered medications prior to visit.    Allergies  Allergen Reactions  . Contrast Media [Iodinated Diagnostic Agents] Hives and Swelling    ROS Review of Systems  Constitutional: Negative.   HENT: Negative.   Eyes: Negative.   Respiratory: Negative.   Cardiovascular: Negative.   Gastrointestinal: Positive for abdominal distention and abdominal pain (chronic generalized abdominal pain).  Endocrine: Negative.   Genitourinary: Negative.   Musculoskeletal: Negative.   Skin: Positive for wound (lower abdominal wound).  Allergic/Immunologic: Negative.   Neurological: Positive for dizziness (occasional ) and headaches (occasional ).  Hematological: Negative.   Psychiatric/Behavioral: Negative.       Objective:    Physical Exam Vitals and nursing note reviewed.  Constitutional:      Appearance: Normal appearance.  HENT:     Head: Normocephalic and atraumatic.     Nose: Nose normal.     Mouth/Throat:     Mouth: Mucous membranes are moist.     Pharynx: Oropharynx is clear.  Cardiovascular:     Rate and Rhythm: Normal rate and regular rhythm.     Pulses: Normal pulses.     Heart sounds: Normal heart sounds.  Pulmonary:     Effort: Pulmonary effort is normal.     Breath sounds: Normal breath sounds.  Abdominal:     General: Bowel sounds are normal. There is distension.     Palpations: Abdomen is soft.     Tenderness: There is abdominal tenderness in the suprapubic area.    Musculoskeletal:        General: Normal range of motion.     Cervical back: Normal range of motion and neck supple.  Skin:    General: Skin is warm.     Comments: Lower abdominal wound; dressing is CD&I;dipharetic skin  Neurological:     General: No focal deficit present.     Mental Status: She is alert and oriented to person, place, and time.  Psychiatric:        Mood and Affect: Mood normal.        Behavior: Behavior normal.        Thought Content: Thought content normal.         Judgment: Judgment normal.     BP 140/90 (BP Location: Left Arm, Patient Position: Sitting, Cuff Size: Large)   Pulse (!) 102   Temp (!) 97.5 F (36.4 C)   Resp 18   Ht $R'5\' 7"'wh$  (1.702 m)  Wt 284 lb 6.4 oz (129 kg)   LMP 05/04/2020 (Exact Date)   SpO2 100%   BMI 44.54 kg/m  Wt Readings from Last 3 Encounters:  05/31/20 284 lb 6.4 oz (129 kg)  05/02/20 283 lb 4.7 oz (128.5 kg)  03/25/20 296 lb 1.6 oz (134.3 kg)     Health Maintenance Due  Topic Date Due  . FOOT EXAM  Never done  . OPHTHALMOLOGY EXAM  Never done  . URINE MICROALBUMIN  Never done  . COVID-19 Vaccine (1) Never done  . TETANUS/TDAP  Never done  . PAP SMEAR-Modifier  Never done  . INFLUENZA VACCINE  04/18/2020    There are no preventive care reminders to display for this patient.  No results found for: TSH Lab Results  Component Value Date   WBC 12.6 (H) 05/12/2020   HGB 10.1 (L) 05/12/2020   HCT 33.1 (L) 05/12/2020   MCV 80.7 05/12/2020   PLT 483 (H) 05/12/2020   Lab Results  Component Value Date   NA 131 (L) 05/12/2020   K 3.8 05/12/2020   CO2 25 05/12/2020   GLUCOSE 323 (H) 05/12/2020   BUN 12 05/12/2020   CREATININE 0.70 05/12/2020   BILITOT 0.6 05/12/2020   ALKPHOS 105 05/12/2020   AST 16 05/12/2020   ALT 16 05/12/2020   PROT 7.6 05/12/2020   ALBUMIN 2.9 (L) 05/12/2020   CALCIUM 8.5 (L) 05/12/2020   ANIONGAP 9 05/12/2020   No results found for: CHOL No results found for: HDL No results found for: LDLCALC No results found for: TRIG No results found for: CHOLHDL Lab Results  Component Value Date   HGBA1C 10.4 (H) 05/02/2020      Assessment & Plan:   1. Hospital discharge follow-up  2. Encounter to establish care  3. Panniuclitis with abdominal wall abscess s/p panniculectomy 05/02/2020 Stable. Would healing well.   4. Open wound of abdominal wall, subsequent encounter Would is healing well, as observed by recent patient photos on phone. She will keep follow up with Dr. Michaell Cowing  as scheduled for would assessment.    5. Chronic bilateral lower abdominal pain  6. Type 2 diabetes mellitus with hyperglycemia, with long-term current use of insulin (HCC) She will continue medication as prescribed, to decrease foods/beverages high in sugars and carbs and follow Heart Healthy or DASH diet. Increase physical activity to at least 30 minutes cardio exercise daily.  - blood glucose meter kit and supplies; Dispense based on patient and insurance preference. Use up to four times daily as directed. (FOR ICD-9 250.00, 250.01).  Dispense: 1 each; Refill: 0 - POC Glucose (CBG) - insulin aspart protamine- aspart (NOVOLOG MIX 70/30) (70-30) 100 UNIT/ML injection; Inject 0.6 mLs (60 Units total) into the skin 2 (two) times daily with a meal.  Dispense: 36 mL; Refill: 11  7. Hemoglobin A1C greater than 9%, indicating poor diabetic control Most recent Hgb A1c at 9.4.  Pharmacy has been out of stock of this medication. She will restart Novolog 70/30 and take as prescribed.   8. Hyperglycemia - insulin aspart protamine- aspart (NOVOLOG MIX 70/30) (70-30) 100 UNIT/ML injection; Inject 0.6 mLs (60 Units total) into the skin 2 (two) times daily with a meal.  Dispense: 36 mL; Refill: 11  9. Follow up She will follow up in 1 month.   Meds ordered this encounter  Medications  . blood glucose meter kit and supplies    Sig: Dispense based on patient and insurance preference. Use up to four  times daily as directed. (FOR ICD-9 250.00, 250.01).    Dispense:  1 each    Refill:  0    Order Specific Question:   Number of strips    Answer:   100    Order Specific Question:   Number of lancets    Answer:   100  . insulin aspart protamine- aspart (NOVOLOG MIX 70/30) (70-30) 100 UNIT/ML injection    Sig: Inject 0.6 mLs (60 Units total) into the skin 2 (two) times daily with a meal.    Dispense:  36 mL    Refill:  11    Orders Placed This Encounter  Procedures  . POC Glucose (CBG)    Referral  Orders  No referral(s) requested today    Kathe Becton,  MSN, FNP-BC [place] [address], [place] [address] [phone] fax   Problem List Items Addressed This Visit      Endocrine   Type 2 diabetes mellitus with hyperglycemia ([place])   Relevant Medications   blood glucose meter kit and supplies   insulin aspart protamine- aspart (NOVOLOG MIX 70/30) (70-30) 100 UNIT/ML injection   Other Relevant Orders   POC Glucose (CBG) (Completed)     Other   Hyperglycemia   Relevant Medications   insulin aspart protamine- aspart (NOVOLOG MIX 70/30) (70-30) 100 UNIT/ML injection   Panniuclitis with abdominal wall abscess s/p panniculectomy 05/02/2020    Other Visit Diagnoses    Hospital discharge follow-up    -  Primary   Encounter to establish care       Open wound of abdominal wall, subsequent encounter       Chronic bilateral lower abdominal pain       Hemoglobin A1C greater than 9%, indicating poor diabetic control       Relevant Medications   insulin aspart protamine- aspart (NOVOLOG MIX 70/30) (70-30) 100 UNIT/ML injection   Follow up          Meds ordered this encounter  Medications  . blood glucose meter kit and supplies    Sig: Dispense based on patient and insurance preference. Use up to four times daily as directed. (FOR ICD-9 250.00, 250.01).    Dispense:  1 each    Refill:  0    Order Specific Question:   Number of strips    Answer:   100    Order Specific Question:   Number of lancets    Answer:   100  . insulin aspart protamine- aspart (NOVOLOG MIX 70/30) (70-30) 100 UNIT/ML injection    Sig: Inject 0.6 mLs (60 Units total) into the skin 2 (two) times daily with a meal.    Dispense:  36 mL    Refill:  11    Follow-up: Return in about 1 month (around 06/30/2020).    Azzie Glatter, FNP

## 2020-06-01 DIAGNOSIS — M726 Necrotizing fasciitis: Secondary | ICD-10-CM | POA: Diagnosis not present

## 2020-06-01 DIAGNOSIS — E1165 Type 2 diabetes mellitus with hyperglycemia: Secondary | ICD-10-CM | POA: Diagnosis not present

## 2020-06-01 DIAGNOSIS — I10 Essential (primary) hypertension: Secondary | ICD-10-CM | POA: Diagnosis not present

## 2020-06-01 DIAGNOSIS — D509 Iron deficiency anemia, unspecified: Secondary | ICD-10-CM | POA: Diagnosis not present

## 2020-06-01 DIAGNOSIS — Z9181 History of falling: Secondary | ICD-10-CM | POA: Diagnosis not present

## 2020-06-01 DIAGNOSIS — Z794 Long term (current) use of insulin: Secondary | ICD-10-CM | POA: Diagnosis not present

## 2020-06-01 DIAGNOSIS — Z4801 Encounter for change or removal of surgical wound dressing: Secondary | ICD-10-CM | POA: Diagnosis not present

## 2020-06-01 DIAGNOSIS — Z791 Long term (current) use of non-steroidal anti-inflammatories (NSAID): Secondary | ICD-10-CM | POA: Diagnosis not present

## 2020-06-01 DIAGNOSIS — Z48817 Encounter for surgical aftercare following surgery on the skin and subcutaneous tissue: Secondary | ICD-10-CM | POA: Diagnosis not present

## 2020-06-01 DIAGNOSIS — T8189XA Other complications of procedures, not elsewhere classified, initial encounter: Secondary | ICD-10-CM | POA: Diagnosis not present

## 2020-06-01 DIAGNOSIS — Z6841 Body Mass Index (BMI) 40.0 and over, adult: Secondary | ICD-10-CM | POA: Diagnosis not present

## 2020-06-01 DIAGNOSIS — L732 Hidradenitis suppurativa: Secondary | ICD-10-CM | POA: Diagnosis not present

## 2020-06-01 DIAGNOSIS — L02211 Cutaneous abscess of abdominal wall: Secondary | ICD-10-CM | POA: Diagnosis not present

## 2020-06-01 DIAGNOSIS — L02215 Cutaneous abscess of perineum: Secondary | ICD-10-CM | POA: Diagnosis not present

## 2020-06-01 DIAGNOSIS — L02214 Cutaneous abscess of groin: Secondary | ICD-10-CM | POA: Diagnosis not present

## 2020-06-01 DIAGNOSIS — U071 COVID-19: Secondary | ICD-10-CM | POA: Diagnosis not present

## 2020-06-02 ENCOUNTER — Encounter: Payer: Self-pay | Admitting: Family Medicine

## 2020-06-02 DIAGNOSIS — T8189XA Other complications of procedures, not elsewhere classified, initial encounter: Secondary | ICD-10-CM | POA: Diagnosis not present

## 2020-06-02 MED ORDER — BLOOD GLUCOSE METER KIT: PACK | 0 refills | Status: DC

## 2020-06-02 MED ORDER — METFORMIN HCL ER 500 MG PO TB24
500.0000 mg | ORAL_TABLET | Freq: Two times a day (BID) | ORAL | 3 refills | Status: DC
Start: 2020-06-02 — End: 2020-08-10

## 2020-06-02 MED ORDER — ONDANSETRON 4 MG PO TBDP: 4.0000 mg | ORAL_TABLET | Freq: Three times a day (TID) | ORAL | 2 refills | Status: DC | PRN

## 2020-06-02 MED ORDER — ALBUTEROL SULFATE HFA 108 (90 BASE) MCG/ACT IN AERS: 2.0000 | INHALATION_SPRAY | RESPIRATORY_TRACT | 11 refills | Status: DC | PRN

## 2020-06-02 MED ORDER — HYDROCHLOROTHIAZIDE 25 MG PO TABS
25.0000 mg | ORAL_TABLET | Freq: Every day | ORAL | 3 refills | Status: DC
Start: 2020-06-02 — End: 2021-10-06

## 2020-06-02 MED ORDER — ACETAMINOPHEN 500 MG PO TABS: 1000.0000 mg | ORAL_TABLET | Freq: Three times a day (TID) | ORAL | 3 refills | Status: DC | PRN

## 2020-06-02 MED ORDER — AMLODIPINE BESYLATE 10 MG PO TABS: 10.0000 mg | ORAL_TABLET | Freq: Every day | ORAL | 3 refills | Status: DC

## 2020-06-02 MED ORDER — IBUPROFEN 600 MG PO TABS
600.0000 mg | ORAL_TABLET | Freq: Four times a day (QID) | ORAL | 3 refills | Status: DC | PRN
Start: 2020-06-02 — End: 2021-02-02

## 2020-06-02 MED ORDER — GABAPENTIN 400 MG PO CAPS
400.0000 mg | ORAL_CAPSULE | Freq: Three times a day (TID) | ORAL | 3 refills | Status: DC | PRN
Start: 2020-06-02 — End: 2021-10-06

## 2020-06-03 DIAGNOSIS — T8189XA Other complications of procedures, not elsewhere classified, initial encounter: Secondary | ICD-10-CM | POA: Diagnosis not present

## 2020-06-03 DIAGNOSIS — Z9181 History of falling: Secondary | ICD-10-CM | POA: Diagnosis not present

## 2020-06-03 DIAGNOSIS — U071 COVID-19: Secondary | ICD-10-CM | POA: Diagnosis not present

## 2020-06-03 DIAGNOSIS — L02211 Cutaneous abscess of abdominal wall: Secondary | ICD-10-CM | POA: Diagnosis not present

## 2020-06-03 DIAGNOSIS — L02215 Cutaneous abscess of perineum: Secondary | ICD-10-CM | POA: Diagnosis not present

## 2020-06-03 DIAGNOSIS — L02214 Cutaneous abscess of groin: Secondary | ICD-10-CM | POA: Diagnosis not present

## 2020-06-03 DIAGNOSIS — D509 Iron deficiency anemia, unspecified: Secondary | ICD-10-CM | POA: Diagnosis not present

## 2020-06-03 DIAGNOSIS — I10 Essential (primary) hypertension: Secondary | ICD-10-CM | POA: Diagnosis not present

## 2020-06-03 DIAGNOSIS — Z4801 Encounter for change or removal of surgical wound dressing: Secondary | ICD-10-CM | POA: Diagnosis not present

## 2020-06-03 DIAGNOSIS — Z48817 Encounter for surgical aftercare following surgery on the skin and subcutaneous tissue: Secondary | ICD-10-CM | POA: Diagnosis not present

## 2020-06-03 DIAGNOSIS — Z791 Long term (current) use of non-steroidal anti-inflammatories (NSAID): Secondary | ICD-10-CM | POA: Diagnosis not present

## 2020-06-03 DIAGNOSIS — Z794 Long term (current) use of insulin: Secondary | ICD-10-CM | POA: Diagnosis not present

## 2020-06-03 DIAGNOSIS — L732 Hidradenitis suppurativa: Secondary | ICD-10-CM | POA: Diagnosis not present

## 2020-06-03 DIAGNOSIS — M726 Necrotizing fasciitis: Secondary | ICD-10-CM | POA: Diagnosis not present

## 2020-06-03 DIAGNOSIS — Z6841 Body Mass Index (BMI) 40.0 and over, adult: Secondary | ICD-10-CM | POA: Diagnosis not present

## 2020-06-03 DIAGNOSIS — E1165 Type 2 diabetes mellitus with hyperglycemia: Secondary | ICD-10-CM | POA: Diagnosis not present

## 2020-06-04 DIAGNOSIS — T8189XA Other complications of procedures, not elsewhere classified, initial encounter: Secondary | ICD-10-CM | POA: Diagnosis not present

## 2020-06-06 DIAGNOSIS — T8189XA Other complications of procedures, not elsewhere classified, initial encounter: Secondary | ICD-10-CM | POA: Diagnosis not present

## 2020-06-08 DIAGNOSIS — Z4801 Encounter for change or removal of surgical wound dressing: Secondary | ICD-10-CM | POA: Diagnosis not present

## 2020-06-08 DIAGNOSIS — Z9181 History of falling: Secondary | ICD-10-CM | POA: Diagnosis not present

## 2020-06-08 DIAGNOSIS — D509 Iron deficiency anemia, unspecified: Secondary | ICD-10-CM | POA: Diagnosis not present

## 2020-06-08 DIAGNOSIS — L732 Hidradenitis suppurativa: Secondary | ICD-10-CM | POA: Diagnosis not present

## 2020-06-08 DIAGNOSIS — Z48817 Encounter for surgical aftercare following surgery on the skin and subcutaneous tissue: Secondary | ICD-10-CM | POA: Diagnosis not present

## 2020-06-08 DIAGNOSIS — L02215 Cutaneous abscess of perineum: Secondary | ICD-10-CM | POA: Diagnosis not present

## 2020-06-08 DIAGNOSIS — L02214 Cutaneous abscess of groin: Secondary | ICD-10-CM | POA: Diagnosis not present

## 2020-06-08 DIAGNOSIS — I10 Essential (primary) hypertension: Secondary | ICD-10-CM | POA: Diagnosis not present

## 2020-06-08 DIAGNOSIS — M726 Necrotizing fasciitis: Secondary | ICD-10-CM | POA: Diagnosis not present

## 2020-06-08 DIAGNOSIS — U071 COVID-19: Secondary | ICD-10-CM | POA: Diagnosis not present

## 2020-06-08 DIAGNOSIS — E1165 Type 2 diabetes mellitus with hyperglycemia: Secondary | ICD-10-CM | POA: Diagnosis not present

## 2020-06-08 DIAGNOSIS — Z794 Long term (current) use of insulin: Secondary | ICD-10-CM | POA: Diagnosis not present

## 2020-06-08 DIAGNOSIS — Z791 Long term (current) use of non-steroidal anti-inflammatories (NSAID): Secondary | ICD-10-CM | POA: Diagnosis not present

## 2020-06-08 DIAGNOSIS — L02211 Cutaneous abscess of abdominal wall: Secondary | ICD-10-CM | POA: Diagnosis not present

## 2020-06-08 DIAGNOSIS — Z6841 Body Mass Index (BMI) 40.0 and over, adult: Secondary | ICD-10-CM | POA: Diagnosis not present

## 2020-06-11 DIAGNOSIS — Z9181 History of falling: Secondary | ICD-10-CM | POA: Diagnosis not present

## 2020-06-11 DIAGNOSIS — Z4801 Encounter for change or removal of surgical wound dressing: Secondary | ICD-10-CM | POA: Diagnosis not present

## 2020-06-11 DIAGNOSIS — U071 COVID-19: Secondary | ICD-10-CM | POA: Diagnosis not present

## 2020-06-11 DIAGNOSIS — L732 Hidradenitis suppurativa: Secondary | ICD-10-CM | POA: Diagnosis not present

## 2020-06-11 DIAGNOSIS — Z794 Long term (current) use of insulin: Secondary | ICD-10-CM | POA: Diagnosis not present

## 2020-06-11 DIAGNOSIS — Z48817 Encounter for surgical aftercare following surgery on the skin and subcutaneous tissue: Secondary | ICD-10-CM | POA: Diagnosis not present

## 2020-06-11 DIAGNOSIS — I10 Essential (primary) hypertension: Secondary | ICD-10-CM | POA: Diagnosis not present

## 2020-06-11 DIAGNOSIS — D509 Iron deficiency anemia, unspecified: Secondary | ICD-10-CM | POA: Diagnosis not present

## 2020-06-11 DIAGNOSIS — Z6841 Body Mass Index (BMI) 40.0 and over, adult: Secondary | ICD-10-CM | POA: Diagnosis not present

## 2020-06-11 DIAGNOSIS — M726 Necrotizing fasciitis: Secondary | ICD-10-CM | POA: Diagnosis not present

## 2020-06-11 DIAGNOSIS — E1165 Type 2 diabetes mellitus with hyperglycemia: Secondary | ICD-10-CM | POA: Diagnosis not present

## 2020-06-11 DIAGNOSIS — Z791 Long term (current) use of non-steroidal anti-inflammatories (NSAID): Secondary | ICD-10-CM | POA: Diagnosis not present

## 2020-06-11 DIAGNOSIS — L02215 Cutaneous abscess of perineum: Secondary | ICD-10-CM | POA: Diagnosis not present

## 2020-06-11 DIAGNOSIS — L02211 Cutaneous abscess of abdominal wall: Secondary | ICD-10-CM | POA: Diagnosis not present

## 2020-06-11 DIAGNOSIS — L02214 Cutaneous abscess of groin: Secondary | ICD-10-CM | POA: Diagnosis not present

## 2020-06-15 DIAGNOSIS — Z9181 History of falling: Secondary | ICD-10-CM | POA: Diagnosis not present

## 2020-06-15 DIAGNOSIS — I10 Essential (primary) hypertension: Secondary | ICD-10-CM | POA: Diagnosis not present

## 2020-06-15 DIAGNOSIS — E1165 Type 2 diabetes mellitus with hyperglycemia: Secondary | ICD-10-CM | POA: Diagnosis not present

## 2020-06-15 DIAGNOSIS — L02211 Cutaneous abscess of abdominal wall: Secondary | ICD-10-CM | POA: Diagnosis not present

## 2020-06-15 DIAGNOSIS — L02214 Cutaneous abscess of groin: Secondary | ICD-10-CM | POA: Diagnosis not present

## 2020-06-15 DIAGNOSIS — M726 Necrotizing fasciitis: Secondary | ICD-10-CM | POA: Diagnosis not present

## 2020-06-15 DIAGNOSIS — Z48817 Encounter for surgical aftercare following surgery on the skin and subcutaneous tissue: Secondary | ICD-10-CM | POA: Diagnosis not present

## 2020-06-15 DIAGNOSIS — Z791 Long term (current) use of non-steroidal anti-inflammatories (NSAID): Secondary | ICD-10-CM | POA: Diagnosis not present

## 2020-06-15 DIAGNOSIS — U071 COVID-19: Secondary | ICD-10-CM | POA: Diagnosis not present

## 2020-06-15 DIAGNOSIS — Z794 Long term (current) use of insulin: Secondary | ICD-10-CM | POA: Diagnosis not present

## 2020-06-15 DIAGNOSIS — D509 Iron deficiency anemia, unspecified: Secondary | ICD-10-CM | POA: Diagnosis not present

## 2020-06-15 DIAGNOSIS — L732 Hidradenitis suppurativa: Secondary | ICD-10-CM | POA: Diagnosis not present

## 2020-06-15 DIAGNOSIS — L02215 Cutaneous abscess of perineum: Secondary | ICD-10-CM | POA: Diagnosis not present

## 2020-06-15 DIAGNOSIS — Z4801 Encounter for change or removal of surgical wound dressing: Secondary | ICD-10-CM | POA: Diagnosis not present

## 2020-06-15 DIAGNOSIS — Z6841 Body Mass Index (BMI) 40.0 and over, adult: Secondary | ICD-10-CM | POA: Diagnosis not present

## 2020-06-22 DIAGNOSIS — Z4801 Encounter for change or removal of surgical wound dressing: Secondary | ICD-10-CM | POA: Diagnosis not present

## 2020-06-22 DIAGNOSIS — D509 Iron deficiency anemia, unspecified: Secondary | ICD-10-CM | POA: Diagnosis not present

## 2020-06-22 DIAGNOSIS — L02211 Cutaneous abscess of abdominal wall: Secondary | ICD-10-CM | POA: Diagnosis not present

## 2020-06-22 DIAGNOSIS — E1165 Type 2 diabetes mellitus with hyperglycemia: Secondary | ICD-10-CM | POA: Diagnosis not present

## 2020-06-22 DIAGNOSIS — Z794 Long term (current) use of insulin: Secondary | ICD-10-CM | POA: Diagnosis not present

## 2020-06-22 DIAGNOSIS — L02215 Cutaneous abscess of perineum: Secondary | ICD-10-CM | POA: Diagnosis not present

## 2020-06-22 DIAGNOSIS — L732 Hidradenitis suppurativa: Secondary | ICD-10-CM | POA: Diagnosis not present

## 2020-06-22 DIAGNOSIS — Z6841 Body Mass Index (BMI) 40.0 and over, adult: Secondary | ICD-10-CM | POA: Diagnosis not present

## 2020-06-22 DIAGNOSIS — L02214 Cutaneous abscess of groin: Secondary | ICD-10-CM | POA: Diagnosis not present

## 2020-06-22 DIAGNOSIS — Z9181 History of falling: Secondary | ICD-10-CM | POA: Diagnosis not present

## 2020-06-22 DIAGNOSIS — Z48817 Encounter for surgical aftercare following surgery on the skin and subcutaneous tissue: Secondary | ICD-10-CM | POA: Diagnosis not present

## 2020-06-22 DIAGNOSIS — I10 Essential (primary) hypertension: Secondary | ICD-10-CM | POA: Diagnosis not present

## 2020-06-22 DIAGNOSIS — U071 COVID-19: Secondary | ICD-10-CM | POA: Diagnosis not present

## 2020-06-22 DIAGNOSIS — Z791 Long term (current) use of non-steroidal anti-inflammatories (NSAID): Secondary | ICD-10-CM | POA: Diagnosis not present

## 2020-06-22 DIAGNOSIS — M726 Necrotizing fasciitis: Secondary | ICD-10-CM | POA: Diagnosis not present

## 2020-06-29 DIAGNOSIS — U071 COVID-19: Secondary | ICD-10-CM | POA: Diagnosis not present

## 2020-06-29 DIAGNOSIS — L02214 Cutaneous abscess of groin: Secondary | ICD-10-CM | POA: Diagnosis not present

## 2020-06-29 DIAGNOSIS — I10 Essential (primary) hypertension: Secondary | ICD-10-CM | POA: Diagnosis not present

## 2020-06-29 DIAGNOSIS — Z9181 History of falling: Secondary | ICD-10-CM | POA: Diagnosis not present

## 2020-06-29 DIAGNOSIS — Z791 Long term (current) use of non-steroidal anti-inflammatories (NSAID): Secondary | ICD-10-CM | POA: Diagnosis not present

## 2020-06-29 DIAGNOSIS — L732 Hidradenitis suppurativa: Secondary | ICD-10-CM | POA: Diagnosis not present

## 2020-06-29 DIAGNOSIS — M726 Necrotizing fasciitis: Secondary | ICD-10-CM | POA: Diagnosis not present

## 2020-06-29 DIAGNOSIS — Z794 Long term (current) use of insulin: Secondary | ICD-10-CM | POA: Diagnosis not present

## 2020-06-29 DIAGNOSIS — L02211 Cutaneous abscess of abdominal wall: Secondary | ICD-10-CM | POA: Diagnosis not present

## 2020-06-29 DIAGNOSIS — D509 Iron deficiency anemia, unspecified: Secondary | ICD-10-CM | POA: Diagnosis not present

## 2020-06-29 DIAGNOSIS — E1165 Type 2 diabetes mellitus with hyperglycemia: Secondary | ICD-10-CM | POA: Diagnosis not present

## 2020-06-29 DIAGNOSIS — Z6841 Body Mass Index (BMI) 40.0 and over, adult: Secondary | ICD-10-CM | POA: Diagnosis not present

## 2020-06-29 DIAGNOSIS — Z4801 Encounter for change or removal of surgical wound dressing: Secondary | ICD-10-CM | POA: Diagnosis not present

## 2020-06-29 DIAGNOSIS — Z48817 Encounter for surgical aftercare following surgery on the skin and subcutaneous tissue: Secondary | ICD-10-CM | POA: Diagnosis not present

## 2020-06-29 DIAGNOSIS — L02215 Cutaneous abscess of perineum: Secondary | ICD-10-CM | POA: Diagnosis not present

## 2020-07-06 ENCOUNTER — Other Ambulatory Visit: Payer: Self-pay

## 2020-07-06 ENCOUNTER — Encounter: Payer: Self-pay | Admitting: Family Medicine

## 2020-07-06 ENCOUNTER — Ambulatory Visit (INDEPENDENT_AMBULATORY_CARE_PROVIDER_SITE_OTHER): Payer: BC Managed Care – PPO | Admitting: Family Medicine

## 2020-07-06 VITALS — BP 158/89 | HR 82 | Temp 98.4°F | Resp 17 | Ht 67.0 in | Wt 303.0 lb

## 2020-07-06 DIAGNOSIS — Z6841 Body Mass Index (BMI) 40.0 and over, adult: Secondary | ICD-10-CM | POA: Diagnosis not present

## 2020-07-06 DIAGNOSIS — L02214 Cutaneous abscess of groin: Secondary | ICD-10-CM | POA: Diagnosis not present

## 2020-07-06 DIAGNOSIS — Z794 Long term (current) use of insulin: Secondary | ICD-10-CM

## 2020-07-06 DIAGNOSIS — R1084 Generalized abdominal pain: Secondary | ICD-10-CM | POA: Diagnosis not present

## 2020-07-06 DIAGNOSIS — J45909 Unspecified asthma, uncomplicated: Secondary | ICD-10-CM

## 2020-07-06 DIAGNOSIS — E1165 Type 2 diabetes mellitus with hyperglycemia: Secondary | ICD-10-CM | POA: Diagnosis not present

## 2020-07-06 DIAGNOSIS — D509 Iron deficiency anemia, unspecified: Secondary | ICD-10-CM | POA: Diagnosis not present

## 2020-07-06 DIAGNOSIS — R0602 Shortness of breath: Secondary | ICD-10-CM

## 2020-07-06 DIAGNOSIS — M726 Necrotizing fasciitis: Secondary | ICD-10-CM | POA: Diagnosis not present

## 2020-07-06 DIAGNOSIS — R7309 Other abnormal glucose: Secondary | ICD-10-CM

## 2020-07-06 DIAGNOSIS — L02211 Cutaneous abscess of abdominal wall: Secondary | ICD-10-CM

## 2020-07-06 DIAGNOSIS — Z09 Encounter for follow-up examination after completed treatment for conditions other than malignant neoplasm: Secondary | ICD-10-CM

## 2020-07-06 DIAGNOSIS — U071 COVID-19: Secondary | ICD-10-CM | POA: Diagnosis not present

## 2020-07-06 DIAGNOSIS — Z791 Long term (current) use of non-steroidal anti-inflammatories (NSAID): Secondary | ICD-10-CM | POA: Diagnosis not present

## 2020-07-06 DIAGNOSIS — R739 Hyperglycemia, unspecified: Secondary | ICD-10-CM

## 2020-07-06 DIAGNOSIS — S31109D Unspecified open wound of abdominal wall, unspecified quadrant without penetration into peritoneal cavity, subsequent encounter: Secondary | ICD-10-CM

## 2020-07-06 DIAGNOSIS — L02215 Cutaneous abscess of perineum: Secondary | ICD-10-CM | POA: Diagnosis not present

## 2020-07-06 DIAGNOSIS — Z4801 Encounter for change or removal of surgical wound dressing: Secondary | ICD-10-CM | POA: Diagnosis not present

## 2020-07-06 DIAGNOSIS — Z48817 Encounter for surgical aftercare following surgery on the skin and subcutaneous tissue: Secondary | ICD-10-CM | POA: Diagnosis not present

## 2020-07-06 DIAGNOSIS — R059 Cough, unspecified: Secondary | ICD-10-CM

## 2020-07-06 DIAGNOSIS — Z9181 History of falling: Secondary | ICD-10-CM | POA: Diagnosis not present

## 2020-07-06 DIAGNOSIS — I10 Essential (primary) hypertension: Secondary | ICD-10-CM

## 2020-07-06 DIAGNOSIS — L732 Hidradenitis suppurativa: Secondary | ICD-10-CM | POA: Diagnosis not present

## 2020-07-06 MED ORDER — OXYCODONE HCL 5 MG PO TABS: 5.0000 mg | ORAL_TABLET | Freq: Three times a day (TID) | ORAL | 0 refills | Status: DC | PRN

## 2020-07-06 MED ORDER — LISINOPRIL 10 MG PO TABS: 10.0000 mg | ORAL_TABLET | Freq: Every day | ORAL | 3 refills | Status: DC

## 2020-07-06 NOTE — Progress Notes (Signed)
Patient [facility]  Established Patient Visit  Subjective:  Patient ID: Carolyn Zimmerman, female    DOB: March 14, 1986  Age: 34 y.o. MRN: [id number]  CC:  Chief Complaint  Patient presents with  . Follow-up    Pt states she is here to check to see if her BP can be lowered, because its running high for the last x2wks.  . Abdominal Pain    Pt states her stomach hurts.X 1 1/2 month. Since pt had surgery.    HPI Carolyn Zimmerman is a 34 year old female who presents for Follow Up today.     Patient Active Problem List   Diagnosis Date Noted  . Panniuclitis with abdominal wall abscess s/p panniculectomy 05/02/2020 05/02/2020  . Obesity, Class III, BMI 40-49.9 (morbid obesity) ([place]) 05/02/2020  . COVID-19 virus infection 05/02/2020  . Panniculitis abdominal wall 05/02/2020  . Necrotizing fasciitis ([place]) 05/02/2020  . Sepsis ([place]) 05/02/2020  . Hidradenitis suppurativa of axillae,chest wall, groins 05/02/2020  . Type 2 diabetes mellitus with hyperglycemia ([place]) 10/18/2019  . TOA (tubo-ovarian abscess) 10/17/2019  . Elevated LFTs 09/16/2018  . DM (diabetes mellitus), type 2, uncontrolled ([place]) 09/16/2018  . Uncontrolled hypertension 09/16/2018  . Increased frequency of urination 09/16/2018  . Bile duct stricture 07/30/2018  . Chest wall abscesses x 2 s/p I&D 05/02/2020 06/15/2017  . Hyperglycemia 06/15/2017  . Hx of medication noncompliance    Current Status: Since her last office visit she continues to have mild abdominal pian. Abdominal wound well-healed. her denies visual changes, chest pain, cough, shortness of breath, heart palpitations, and falls. She has occasional headaches and dizziness with position changes. Denies severe headaches, confusion, seizures, double vision, and blurred vision, nausea and vomiting. She denies fevers, chills, fatigue, recent infections, weight loss, and night sweats. She has not had any headaches, visual changes,  dizziness, and falls. No chest pain, heart palpitations, cough and shortness of breath reported. Denies GI problems such as nausea, vomiting, diarrhea, and constipation. She has no reports of blood in stools, dysuria and hematuria. No depression or anxiety reported today. She is taking all medications as prescribed. She denies pain today.   Patient Active Problem List   Diagnosis Date Noted  . Panniuclitis with abdominal wall abscess s/p panniculectomy 05/02/2020 05/02/2020  . Obesity, Class III, BMI 40-49.9 (morbid obesity) ([place]) 05/02/2020  . COVID-19 virus infection 05/02/2020  . Panniculitis abdominal wall 05/02/2020  . Necrotizing fasciitis ([place]) 05/02/2020  . Sepsis ([place]) 05/02/2020  . Hidradenitis suppurativa of axillae,chest wall, groins 05/02/2020  . Type 2 diabetes mellitus with hyperglycemia ([place]) 10/18/2019  . TOA (tubo-ovarian abscess) 10/17/2019  . Elevated LFTs 09/16/2018  . DM (diabetes mellitus), type 2, uncontrolled ([place]) 09/16/2018  . Uncontrolled hypertension 09/16/2018  . Increased frequency of urination 09/16/2018  . Bile duct stricture 07/30/2018  . Chest wall abscesses x 2 s/p I&D 05/02/2020 06/15/2017  . Hyperglycemia 06/15/2017  . Hx of medication noncompliance     Current Status: Since her last office visit, she is doing well with no complaints. She continues to have moderate abdominal pain r/t recent surgery, which wound has healed well. She denies visual changes, chest pain, cough, shortness of breath, heart palpitations, and falls. She has occasional headaches and dizziness with position changes. Denies severe headaches, confusion, seizures, double vision, and blurred vision, nausea and vomiting. She denies fevers, chills, fatigue, recent infections, weight loss, and night sweats. She has not had any visual changes, and falls. No chest  pain, heart palpitations, cough and shortness of breath reported. Denies GI problems such as diarrhea, and constipation. She has no  reports of blood in stools, dysuria and hematuria. No depression or anxiety reported.  She is taking all medications as prescribed.   Past Medical History:  Diagnosis Date  . Abscess of chest wall 06/15/2017  . Coronavirus infection 05/02/2020  . Diabetes mellitus   . Hypertension   . Jaundice   . Obesity   . TOA (tubo-ovarian abscess) 09/2019    Past Surgical History:  Procedure Laterality Date  . CHOLECYSTECTOMY    . IRRIGATION AND DEBRIDEMENT ABSCESS N/A 05/02/2020   Procedure: IRRIGATION AND DEBRIDEMENT ABDOMINAL and CHEST WALL NECROTIZING FASCITITS;  Surgeon: Michael Boston, MD;  Location: [facility];  Service: General;  Laterality: N/A;  . liver stent      Family History  Problem Relation Age of Onset  . Hypertension Mother   . Diabetes Father   . Hypertension Father     Social History   Socioeconomic History  . Marital status: Single    Spouse name: Not on file  . Number of children: Not on file  . Years of education: Not on file  . Highest education level: Not on file  Occupational History  . Not on file  Tobacco Use  . Smoking status: Current Some Day Smoker    Types: Cigarettes  . Smokeless tobacco: Never Used  Vaping Use  . Vaping Use: Former  Substance and Sexual Activity  . Alcohol use: Not Currently    Comment: occ  . Drug use: No  . Sexual activity: Not on file  Other Topics Concern  . Not on file  Social History Narrative  . Not on file   Social Determinants of Health   Financial Resource Strain:   . Difficulty of Paying Living Expenses: Not on file  Food Insecurity:   . Worried About Charity fundraiser in the Last Year: Not on file  . Ran Out of Food in the Last Year: Not on file  Transportation Needs:   . Lack of Transportation (Medical): Not on file  . Lack of Transportation (Non-Medical): Not on file  Physical Activity:   . Days of Exercise per Week: Not on file  . Minutes of Exercise per Session: Not on file  Stress:   . Feeling of  Stress : Not on file  Social Connections:   . Frequency of Communication with Friends and Family: Not on file  . Frequency of Social Gatherings with Friends and Family: Not on file  . Attends Religious Services: Not on file  . Active Member of Clubs or Organizations: Not on file  . Attends Archivist Meetings: Not on file  . Marital Status: Not on file  Intimate Partner Violence:   . Fear of Current or Ex-Partner: Not on file  . Emotionally Abused: Not on file  . Physically Abused: Not on file  . Sexually Abused: Not on file    Outpatient Medications Prior to Visit  Medication Sig Dispense Refill  . acetaminophen (TYLENOL) 500 MG tablet Take 2 tablets (1,000 mg total) by mouth every 8 (eight) hours as needed for mild pain. 60 tablet 3  . albuterol (VENTOLIN HFA) 108 (90 Base) MCG/ACT inhaler Inhale 2 puffs into the lungs every 4 (four) hours as needed for wheezing or shortness of breath. 18 each 11  . amLODipine (NORVASC) 10 MG tablet Take 1 tablet (10 mg total) by mouth daily. 90 tablet  3  . blood glucose meter kit and supplies Dispense based on patient and insurance preference. Use up to four times daily as directed. (FOR ICD-9 250.00, 250.01). 1 each 0  . gabapentin (NEURONTIN) 400 MG capsule Take 1 capsule (400 mg total) by mouth 3 (three) times daily as needed. 90 capsule 3  . hydrochlorothiazide (HYDRODIURIL) 25 MG tablet Take 1 tablet (25 mg total) by mouth daily. 90 tablet 3  . ibuprofen (ADVIL) 600 MG tablet Take 1 tablet (600 mg total) by mouth every 6 (six) hours as needed (mild pain). 30 tablet 3  . insulin aspart protamine- aspart (NOVOLOG MIX 70/30) (70-30) 100 UNIT/ML injection Inject 0.6 mLs (60 Units total) into the skin 2 (two) times daily with a meal. 36 mL 11  . Insulin Syringe-Needle U-100 (INSULIN SYRINGE .3CC/31GX5/16") 31G X 5/16" 0.3 ML MISC Use with insulin, twice daily 100 each 3  . metFORMIN (GLUCOPHAGE-XR) 500 MG 24 hr tablet Take 1 tablet (500 mg  total) by mouth 2 times daily at 12 noon and 4 pm. 180 tablet 3  . ondansetron (ZOFRAN ODT) 4 MG disintegrating tablet Take 1 tablet (4 mg total) by mouth every 8 (eight) hours as needed for nausea or vomiting. 20 tablet 2  . Phenylephrine-Pheniramine-DM (THERAFLU COLD & COUGH PO) Take 30 mLs by mouth daily as needed (cold and cough).     . polyethylene glycol (MIRALAX / GLYCOLAX) 17 g packet Take 17 g by mouth daily as needed for mild constipation or moderate constipation. Also available over-the-counter 30 each 0  . psyllium (HYDROCIL/METAMUCIL) 95 % PACK Take 1 packet by mouth 2 (two) times daily. (Patient not taking: Reported on 05/31/2020) 240 each 1  . oxyCODONE (OXY IR/ROXICODONE) 5 MG immediate release tablet Take 1-2 tablets (5-10 mg total) by mouth every 8 (eight) hours as needed for severe pain (take 40 minutes before wound vac change). (Patient not taking: Reported on 07/06/2020) 25 tablet 0   No facility-administered medications prior to visit.    Allergies  Allergen Reactions  . Contrast Media [Iodinated Diagnostic Agents] Hives and Swelling    ROS Review of Systems  Constitutional: Negative.   HENT: Negative.   Eyes: Negative.   Respiratory: Negative.   Cardiovascular: Negative.   Gastrointestinal: Positive for abdominal distention (obese) and abdominal pain (generalized).  Endocrine: Negative.   Genitourinary: Negative.   Musculoskeletal: Negative.   Skin: Negative.   Allergic/Immunologic: Negative.   Neurological: Positive for dizziness (occasional ) and headaches (occasional ).  Hematological: Negative.   Psychiatric/Behavioral: Negative.    Objective:    Physical Exam Vitals and nursing note reviewed.  Constitutional:      Appearance: Normal appearance. She is well-developed.  HENT:     Head: Normocephalic and atraumatic.     Nose: Nose normal.     Mouth/Throat:     Mouth: Mucous membranes are moist.     Pharynx: Oropharynx is clear.  Cardiovascular:      Rate and Rhythm: Normal rate and regular rhythm.     Pulses: Normal pulses.     Heart sounds: Normal heart sounds.  Pulmonary:     Effort: Pulmonary effort is normal.     Breath sounds: Normal breath sounds.  Abdominal:     General: Abdomen is protuberant. Bowel sounds are normal. There is distension.     Palpations: Abdomen is soft.     Tenderness: Left CVA tenderness: obese; well-healed abdominal wound.  Musculoskeletal:        General: Normal  range of motion.     Cervical back: Normal range of motion and neck supple.  Skin:    General: Skin is warm and dry.  Neurological:     General: No focal deficit present.     Mental Status: She is alert and oriented to person, place, and time.  Psychiatric:        Mood and Affect: Mood normal.        Behavior: Behavior normal.        Thought Content: Thought content normal.        Judgment: Judgment normal.    BP (!) 158/89 (BP Location: Left Arm, Patient Position: Sitting, Cuff Size: Large)   Pulse 82   Temp 98.4 F (36.9 C)   Resp 17   Ht $R'5\' 7"'Vn$  (1.702 m)   Wt (!) 303 lb (137.4 kg)   LMP 07/02/2020 (Exact Date)   SpO2 96%   BMI 47.46 kg/m  Wt Readings from Last 3 Encounters:  07/06/20 (!) 303 lb (137.4 kg)  05/31/20 284 lb 6.4 oz (129 kg)  05/02/20 283 lb 4.7 oz (128.5 kg)     Health Maintenance Due  Topic Date Due  . FOOT EXAM  Never done  . OPHTHALMOLOGY EXAM  Never done  . COVID-19 Vaccine (1) Never done  . TETANUS/TDAP  Never done  . PAP SMEAR-Modifier  Never done  . INFLUENZA VACCINE  04/18/2020    There are no preventive care reminders to display for this patient.  No results found for: TSH Lab Results  Component Value Date   WBC 12.6 (H) 05/12/2020   HGB 10.1 (L) 05/12/2020   HCT 33.1 (L) 05/12/2020   MCV 80.7 05/12/2020   PLT 483 (H) 05/12/2020   Lab Results  Component Value Date   NA 131 (L) 05/12/2020   K 3.8 05/12/2020   CO2 25 05/12/2020   GLUCOSE 323 (H) 05/12/2020   BUN 12 05/12/2020    CREATININE 0.70 05/12/2020   BILITOT 0.6 05/12/2020   ALKPHOS 105 05/12/2020   AST 16 05/12/2020   ALT 16 05/12/2020   PROT 7.6 05/12/2020   ALBUMIN 2.9 (L) 05/12/2020   CALCIUM 8.5 (L) 05/12/2020   ANIONGAP 9 05/12/2020   No results found for: CHOL No results found for: HDL No results found for: LDLCALC No results found for: TRIG No results found for: CHOLHDL Lab Results  Component Value Date   HGBA1C 10.4 (H) 05/02/2020   Assessment & Plan:   1. Panniuclitis with abdominal wall abscess s/p panniculectomy 05/02/2020 - oxyCODONE (OXY IR/ROXICODONE) 5 MG immediate release tablet; Take 1-2 tablets (5-10 mg total) by mouth every 8 (eight) hours as needed for severe pain (take 40 minutes before wound vac change).  Dispense: 25 tablet; Refill: 0  2. Open wound of abdominal wall, subsequent encounter - oxyCODONE (OXY IR/ROXICODONE) 5 MG immediate release tablet; Take 1-2 tablets (5-10 mg total) by mouth every 8 (eight) hours as needed for severe pain (take 40 minutes before wound vac change).  Dispense: 25 tablet; Refill: 0  3. Generalized abdominal pain - oxyCODONE (OXY IR/ROXICODONE) 5 MG immediate release tablet; Take 1-2 tablets (5-10 mg total) by mouth every 8 (eight) hours as needed for severe pain (take 40 minutes before wound vac change).  Dispense: 25 tablet; Refill: 0  4. Elevated blood pressure reading with diagnosis of hypertension  5. Hypertension, unspecified type She will continue to take medications as prescribed, to decrease high sodium intake, excessive alcohol intake, increase potassium intake,  smoking cessation, and increase physical activity of at least 30 minutes of cardio activity daily. She will continue to follow Heart Healthy or DASH diet. - lisinopril (ZESTRIL) 10 MG tablet; Take 1 tablet (10 mg total) by mouth daily.  Dispense: 90 tablet; Refill: 3  6. Mild asthma without complication, unspecified whether persistent Stable. No signs or symptoms of respiratory  distress noted or reported today.   7. Shortness of breath  8. Cough  9. Type 2 diabetes mellitus with hyperglycemia, with long-term current use of insulin (HCC) She will continue medication as prescribed, to decrease foods/beverages high in sugars and carbs and follow Heart Healthy or DASH diet. Increase physical activity to at least 30 minutes cardio exercise daily.   10. Hemoglobin A1C greater than 9%, indicating poor diabetic control  11. Hyperglycemia  12. Follow up She will follow up 07/2020.  Meds ordered this encounter  Medications  . lisinopril (ZESTRIL) 10 MG tablet    Sig: Take 1 tablet (10 mg total) by mouth daily.    Dispense:  90 tablet    Refill:  3  . oxyCODONE (OXY IR/ROXICODONE) 5 MG immediate release tablet    Sig: Take 1-2 tablets (5-10 mg total) by mouth every 8 (eight) hours as needed for severe pain (take 40 minutes before wound vac change).    Dispense:  25 tablet    Refill:  0    Order Specific Question:   Supervising Provider    Answer:   Tresa Garter [id number]    No orders of the defined types were placed in this encounter.   Referral Orders  No referral(s) requested today    Kathe Becton,  MSN, FNP-BC [facility] [address] [place], [place] [address] [phone] fax   Problem List Items Addressed This Visit      Endocrine   Type 2 diabetes mellitus with hyperglycemia ([place])   Relevant Medications   lisinopril (ZESTRIL) 10 MG tablet     Other   Hyperglycemia   Panniuclitis with abdominal wall abscess s/p panniculectomy 05/02/2020 - Primary   Relevant Medications   oxyCODONE (OXY IR/ROXICODONE) 5 MG immediate release tablet    Other Visit Diagnoses    Open wound of abdominal wall, subsequent encounter       Relevant Medications   oxyCODONE (OXY IR/ROXICODONE) 5 MG immediate release tablet   Generalized abdominal pain        Relevant Medications   oxyCODONE (OXY IR/ROXICODONE) 5 MG immediate release tablet   Elevated blood pressure reading with diagnosis of hypertension       Relevant Medications   lisinopril (ZESTRIL) 10 MG tablet   Hypertension, unspecified type       Relevant Medications   lisinopril (ZESTRIL) 10 MG tablet   Mild asthma without complication, unspecified whether persistent       Shortness of breath       Cough       Hemoglobin A1C greater than 9%, indicating poor diabetic control       Relevant Medications   lisinopril (ZESTRIL) 10 MG tablet   Follow up          Meds ordered this encounter  Medications  . lisinopril (ZESTRIL) 10 MG tablet    Sig: Take 1 tablet (10 mg total) by mouth daily.    Dispense:  90 tablet    Refill:  3  . oxyCODONE (OXY IR/ROXICODONE) 5 MG immediate release  tablet    Sig: Take 1-2 tablets (5-10 mg total) by mouth every 8 (eight) hours as needed for severe pain (take 40 minutes before wound vac change).    Dispense:  25 tablet    Refill:  0    Order Specific Question:   Supervising Provider    Answer:   Tresa Garter [id number]    Follow-up: No follow-ups on file.    Azzie Glatter, FNP

## 2020-07-11 ENCOUNTER — Encounter: Payer: Self-pay | Admitting: Family Medicine

## 2020-07-12 DIAGNOSIS — Z9181 History of falling: Secondary | ICD-10-CM | POA: Diagnosis not present

## 2020-07-12 DIAGNOSIS — L732 Hidradenitis suppurativa: Secondary | ICD-10-CM | POA: Diagnosis not present

## 2020-07-12 DIAGNOSIS — Z791 Long term (current) use of non-steroidal anti-inflammatories (NSAID): Secondary | ICD-10-CM | POA: Diagnosis not present

## 2020-07-12 DIAGNOSIS — L02215 Cutaneous abscess of perineum: Secondary | ICD-10-CM | POA: Diagnosis not present

## 2020-07-12 DIAGNOSIS — Z4801 Encounter for change or removal of surgical wound dressing: Secondary | ICD-10-CM | POA: Diagnosis not present

## 2020-07-12 DIAGNOSIS — L02214 Cutaneous abscess of groin: Secondary | ICD-10-CM | POA: Diagnosis not present

## 2020-07-12 DIAGNOSIS — Z48817 Encounter for surgical aftercare following surgery on the skin and subcutaneous tissue: Secondary | ICD-10-CM | POA: Diagnosis not present

## 2020-07-12 DIAGNOSIS — I10 Essential (primary) hypertension: Secondary | ICD-10-CM | POA: Diagnosis not present

## 2020-07-12 DIAGNOSIS — D509 Iron deficiency anemia, unspecified: Secondary | ICD-10-CM | POA: Diagnosis not present

## 2020-07-12 DIAGNOSIS — E1165 Type 2 diabetes mellitus with hyperglycemia: Secondary | ICD-10-CM | POA: Diagnosis not present

## 2020-07-12 DIAGNOSIS — M726 Necrotizing fasciitis: Secondary | ICD-10-CM | POA: Diagnosis not present

## 2020-07-12 DIAGNOSIS — L02211 Cutaneous abscess of abdominal wall: Secondary | ICD-10-CM | POA: Diagnosis not present

## 2020-07-12 DIAGNOSIS — Z6841 Body Mass Index (BMI) 40.0 and over, adult: Secondary | ICD-10-CM | POA: Diagnosis not present

## 2020-07-12 DIAGNOSIS — Z794 Long term (current) use of insulin: Secondary | ICD-10-CM | POA: Diagnosis not present

## 2020-07-12 DIAGNOSIS — U071 COVID-19: Secondary | ICD-10-CM | POA: Diagnosis not present

## 2020-07-13 ENCOUNTER — Telehealth: Payer: Self-pay

## 2020-07-13 NOTE — Telephone Encounter (Signed)
Pt nurse Beth B called to let Mrs. NP Dorene Grebe know that pt can not check her sugar level  because she is out of strips until they come in the mail. Pt BP is running 170/100. Beth also wanted me to let you know when she went to her house on 07/12/20 she was clammy and pt is using her insulin w/o checking her BS. Nurse Beth also wants clarification on her metformin medication.

## 2020-07-22 NOTE — Telephone Encounter (Signed)
Pt was called again to get her home health nurse information. Pt phone states her VM is not set up yet.

## 2020-07-30 ENCOUNTER — Ambulatory Visit: Payer: BC Managed Care – PPO | Admitting: Family Medicine

## 2020-08-03 ENCOUNTER — Ambulatory Visit (INDEPENDENT_AMBULATORY_CARE_PROVIDER_SITE_OTHER): Payer: BC Managed Care – PPO | Admitting: Family Medicine

## 2020-08-03 ENCOUNTER — Encounter: Payer: Self-pay | Admitting: Family Medicine

## 2020-08-03 ENCOUNTER — Other Ambulatory Visit: Payer: Self-pay

## 2020-08-03 VITALS — BP 140/76 | HR 86 | Temp 98.6°F | Ht 68.0 in | Wt 285.2 lb

## 2020-08-03 DIAGNOSIS — R0602 Shortness of breath: Secondary | ICD-10-CM

## 2020-08-03 DIAGNOSIS — R739 Hyperglycemia, unspecified: Secondary | ICD-10-CM | POA: Diagnosis not present

## 2020-08-03 DIAGNOSIS — E1165 Type 2 diabetes mellitus with hyperglycemia: Secondary | ICD-10-CM

## 2020-08-03 DIAGNOSIS — R634 Abnormal weight loss: Secondary | ICD-10-CM

## 2020-08-03 DIAGNOSIS — R7309 Other abnormal glucose: Secondary | ICD-10-CM | POA: Diagnosis not present

## 2020-08-03 DIAGNOSIS — Z09 Encounter for follow-up examination after completed treatment for conditions other than malignant neoplasm: Secondary | ICD-10-CM

## 2020-08-03 DIAGNOSIS — Z9181 History of falling: Secondary | ICD-10-CM

## 2020-08-03 DIAGNOSIS — L02211 Cutaneous abscess of abdominal wall: Secondary | ICD-10-CM

## 2020-08-03 DIAGNOSIS — I10 Essential (primary) hypertension: Secondary | ICD-10-CM

## 2020-08-03 DIAGNOSIS — Z794 Long term (current) use of insulin: Secondary | ICD-10-CM | POA: Diagnosis not present

## 2020-08-03 DIAGNOSIS — Z720 Tobacco use: Secondary | ICD-10-CM

## 2020-08-03 DIAGNOSIS — R1084 Generalized abdominal pain: Secondary | ICD-10-CM

## 2020-08-03 DIAGNOSIS — R059 Cough, unspecified: Secondary | ICD-10-CM

## 2020-08-03 DIAGNOSIS — E66813 Obesity, class 3: Secondary | ICD-10-CM

## 2020-08-03 LAB — POCT URINALYSIS DIPSTICK
Bilirubin, UA: NEGATIVE
Glucose, UA: POSITIVE — AB
Ketones, UA: NEGATIVE
Leukocytes, UA: NEGATIVE
Nitrite, UA: NEGATIVE
Protein, UA: POSITIVE — AB
Spec Grav, UA: 1.02 (ref 1.010–1.025)
Urobilinogen, UA: 0.2 E.U./dL
pH, UA: 6.5 (ref 5.0–8.0)

## 2020-08-03 LAB — POCT GLYCOSYLATED HEMOGLOBIN (HGB A1C)
HbA1c POC (<> result, manual entry): 12.2 % (ref 4.0–5.6)
HbA1c, POC (controlled diabetic range): 12.2 % — AB (ref 0.0–7.0)
HbA1c, POC (prediabetic range): 12.2 % — AB (ref 5.7–6.4)
Hemoglobin A1C: 12.2 % — AB (ref 4.0–5.6)

## 2020-08-03 LAB — GLUCOSE, POCT (MANUAL RESULT ENTRY): POC Glucose: 456 mg/dl — AB (ref 70–99)

## 2020-08-03 NOTE — Progress Notes (Signed)
[facility]   Established Patient Office Visit  Subjective:  Patient ID: Carolyn Zimmerman, female    DOB: 07/18/1986  Age: 34 y.o. MRN: [id number]  CC:  Chief Complaint  Patient presents with  . Follow-up    Pt states she thinks it's to f/u on A1C.    HPI Carolyn Zimmerman is a 34 year old female who presents for Follow Up today.    Patient Active Problem List   Diagnosis Date Noted  . Panniuclitis with abdominal wall abscess s/p panniculectomy 05/02/2020 05/02/2020  . Obesity, Class III, BMI 40-49.9 (morbid obesity) (HCC) 05/02/2020  . COVID-19 virus infection 05/02/2020  . Panniculitis abdominal wall 05/02/2020  . Necrotizing fasciitis (HCC) 05/02/2020  . Sepsis (HCC) 05/02/2020  . Hidradenitis suppurativa of axillae,chest wall, groins 05/02/2020  . Type 2 diabetes mellitus with hyperglycemia (HCC) 10/18/2019  . TOA (tubo-ovarian abscess) 10/17/2019  . Elevated LFTs 09/16/2018  . DM (diabetes mellitus), type 2, uncontrolled (HCC) 09/16/2018  . Uncontrolled hypertension 09/16/2018  . Increased frequency of urination 09/16/2018  . Bile duct stricture 07/30/2018  . Chest wall abscesses x 2 s/p I&D 05/02/2020 06/15/2017  . Hyperglycemia 06/15/2017  . Hx of medication noncompliance    Current Status: Since her last office visit, she is doing well with no complaints. She has had significant weight loss since her last appointment. She denies fevers, chills, fatigue, recent infections, and night sweats. She denies visual changes, chest pain, cough, shortness of breath, heart palpitations, and falls. She has occasional headaches and dizziness with position changes. Denies severe headaches, confusion, seizures, double vision, and blurred vision, nausea and vomiting. She states that she does have mild abdominal pain r/t history of Panniculitis with abdominal abscess on 05/02/2020. Denies GI problems such diarrhea, and constipation. She has no reports  of blood in stools, dysuria and hematuria. No depression or anxiety reported today.  She is taking all medications as prescribed. She denies pain today.   Past Medical History:  Diagnosis Date  . Abscess of chest wall 06/15/2017  . Coronavirus infection 05/02/2020  . Diabetes mellitus   . Hypertension   . Jaundice   . Obesity   . TOA (tubo-ovarian abscess) 09/2019    Past Surgical History:  Procedure Laterality Date  . CHOLECYSTECTOMY    . IRRIGATION AND DEBRIDEMENT ABSCESS N/A 05/02/2020   Procedure: IRRIGATION AND DEBRIDEMENT ABDOMINAL and CHEST WALL NECROTIZING FASCITITS;  Surgeon: Gross, Steven, MD;  Location: [facility];  Service: General;  Laterality: N/A;  . liver stent      Family History  Problem Relation Age of Onset  . Hypertension Mother   . Diabetes Father   . Hypertension Father     Social History   Socioeconomic History  . Marital status: Single    Spouse name: Not on file  . Number of children: Not on file  . Years of education: Not on file  . Highest education level: Not on file  Occupational History  . Not on file  Tobacco Use  . Smoking status: Current Some Day Smoker    Types: Cigarettes  . Smokeless tobacco: Never Used  Vaping Use  . Vaping Use: Former  Substance and Sexual Activity  . Alcohol use: Not Currently    Comment: occ  . Drug use: No  . Sexual activity: Not on file  Other Topics Concern  . Not on file  Social History Narrative  . Not on file   Social Determinants of   Health   Financial Resource Strain:   . Difficulty of Paying Living Expenses: Not on file  Food Insecurity:   . Worried About Running Out of Food in the Last Year: Not on file  . Ran Out of Food in the Last Year: Not on file  Transportation Needs:   . Lack of Transportation (Medical): Not on file  . Lack of Transportation (Non-Medical): Not on file  Physical Activity:   . Days of Exercise per Week: Not on file  . Minutes of Exercise per Session: Not on file    Stress:   . Feeling of Stress : Not on file  Social Connections:   . Frequency of Communication with Friends and Family: Not on file  . Frequency of Social Gatherings with Friends and Family: Not on file  . Attends Religious Services: Not on file  . Active Member of Clubs or Organizations: Not on file  . Attends Club or Organization Meetings: Not on file  . Marital Status: Not on file  Intimate Partner Violence:   . Fear of Current or Ex-Partner: Not on file  . Emotionally Abused: Not on file  . Physically Abused: Not on file  . Sexually Abused: Not on file    Outpatient Medications Prior to Visit  Medication Sig Dispense Refill  . acetaminophen (TYLENOL) 500 MG tablet Take 2 tablets (1,000 mg total) by mouth every 8 (eight) hours as needed for mild pain. 60 tablet 3  . albuterol (VENTOLIN HFA) 108 (90 Base) MCG/ACT inhaler Inhale 2 puffs into the lungs every 4 (four) hours as needed for wheezing or shortness of breath. 18 each 11  . amLODipine (NORVASC) 10 MG tablet Take 1 tablet (10 mg total) by mouth daily. 90 tablet 3  . blood glucose meter kit and supplies Dispense based on patient and insurance preference. Use up to four times daily as directed. (FOR ICD-9 250.00, 250.01). 1 each 0  . gabapentin (NEURONTIN) 400 MG capsule Take 1 capsule (400 mg total) by mouth 3 (three) times daily as needed. 90 capsule 3  . hydrochlorothiazide (HYDRODIURIL) 25 MG tablet Take 1 tablet (25 mg total) by mouth daily. 90 tablet 3  . ibuprofen (ADVIL) 600 MG tablet Take 1 tablet (600 mg total) by mouth every 6 (six) hours as needed (mild pain). 30 tablet 3  . insulin aspart protamine- aspart (NOVOLOG MIX 70/30) (70-30) 100 UNIT/ML injection Inject 0.6 mLs (60 Units total) into the skin 2 (two) times daily with a meal. 36 mL 11  . Insulin Syringe-Needle U-100 (INSULIN SYRINGE .3CC/31GX5/16") 31G X 5/16" 0.3 ML MISC Use with insulin, twice daily 100 each 3  . lisinopril (ZESTRIL) 10 MG tablet Take 1  tablet (10 mg total) by mouth daily. 90 tablet 3  . metFORMIN (GLUCOPHAGE-XR) 500 MG 24 hr tablet Take 1 tablet (500 mg total) by mouth 2 times daily at 12 noon and 4 pm. 180 tablet 3  . ondansetron (ZOFRAN ODT) 4 MG disintegrating tablet Take 1 tablet (4 mg total) by mouth every 8 (eight) hours as needed for nausea or vomiting. 20 tablet 2  . oxyCODONE (OXY IR/ROXICODONE) 5 MG immediate release tablet Take 1-2 tablets (5-10 mg total) by mouth every 8 (eight) hours as needed for severe pain (take 40 minutes before wound vac change). 25 tablet 0  . Phenylephrine-Pheniramine-DM (THERAFLU COLD & COUGH PO) Take 30 mLs by mouth daily as needed (cold and cough).     . polyethylene glycol (MIRALAX / GLYCOLAX) 17 g   packet Take 17 g by mouth daily as needed for mild constipation or moderate constipation. Also available over-the-counter 30 each 0  . psyllium (HYDROCIL/METAMUCIL) 95 % PACK Take 1 packet by mouth 2 (two) times daily. (Patient not taking: Reported on 05/31/2020) 240 each 1   No facility-administered medications prior to visit.    Allergies  Allergen Reactions  . Contrast Media [Iodinated Diagnostic Agents] Hives and Swelling    ROS Review of Systems  Constitutional: Negative.   HENT: Negative.   Eyes: Negative.   Respiratory: Negative.   Cardiovascular: Negative.   Gastrointestinal: Positive for abdominal distention (obese) and abdominal pain (r/t recent surgery).  Endocrine: Negative.   Genitourinary: Negative.   Musculoskeletal: Negative.   Skin: Negative.   Allergic/Immunologic: Negative.   Neurological: Positive for dizziness (occasional ), weakness (recent fall) and headaches (occasional ).  Hematological: Negative.   Psychiatric/Behavioral: Negative.       Objective:    Physical Exam Vitals and nursing note reviewed.  Constitutional:      Appearance: Normal appearance.  HENT:     Head: Normocephalic and atraumatic.     Nose: Nose normal.  Cardiovascular:     Rate  and Rhythm: Normal rate and regular rhythm.     Pulses: Normal pulses.     Heart sounds: Normal heart sounds.  Pulmonary:     Effort: Pulmonary effort is normal.     Breath sounds: Normal breath sounds.  Abdominal:     General: Bowel sounds are normal.     Palpations: Abdomen is soft.  Musculoskeletal:        General: Normal range of motion.     Cervical back: Normal range of motion and neck supple.  Skin:    General: Skin is warm and dry.  Neurological:     General: No focal deficit present.     Mental Status: She is alert and oriented to person, place, and time.  Psychiatric:        Mood and Affect: Mood normal.        Behavior: Behavior normal.        Thought Content: Thought content normal.        Judgment: Judgment normal.     BP 140/76 (BP Location: Left Arm, Patient Position: Sitting, Cuff Size: Large)   Pulse 86   Temp 98.6 F (37 C)   Ht 5' 8" (1.727 m)   Wt 285 lb 3.2 oz (129.4 kg)   LMP 07/30/2020 (Approximate)   BMI 43.36 kg/m  Wt Readings from Last 3 Encounters:  08/03/20 285 lb 3.2 oz (129.4 kg)  07/06/20 (!) 303 lb (137.4 kg)  05/31/20 284 lb 6.4 oz (129 kg)     Health Maintenance Due  Topic Date Due  . FOOT EXAM  Never done  . OPHTHALMOLOGY EXAM  Never done  . COVID-19 Vaccine (1) Never done  . TETANUS/TDAP  Never done  . PAP SMEAR-Modifier  Never done  . INFLUENZA VACCINE  04/18/2020    There are no preventive care reminders to display for this patient.  No results found for: TSH Lab Results  Component Value Date   WBC 12.6 (H) 05/12/2020   HGB 10.1 (L) 05/12/2020   HCT 33.1 (L) 05/12/2020   MCV 80.7 05/12/2020   PLT 483 (H) 05/12/2020   Lab Results  Component Value Date   NA 131 (L) 05/12/2020   K 3.8 05/12/2020   CO2 25 05/12/2020   GLUCOSE 323 (H) 05/12/2020   BUN 12  05/12/2020   CREATININE 0.70 05/12/2020   BILITOT 0.6 05/12/2020   ALKPHOS 105 05/12/2020   AST 16 05/12/2020   ALT 16 05/12/2020   PROT 7.6 05/12/2020    ALBUMIN 2.9 (L) 05/12/2020   CALCIUM 8.5 (L) 05/12/2020   ANIONGAP 9 05/12/2020   No results found for: CHOL No results found for: HDL No results found for: LDLCALC No results found for: TRIG No results found for: CHOLHDL Lab Results  Component Value Date   HGBA1C 12.2 (A) 08/03/2020   HGBA1C 12.2 08/03/2020   HGBA1C 12.2 (A) 08/03/2020   HGBA1C 12.2 (A) 08/03/2020   Assessment & Plan:   1. Type 2 diabetes mellitus with hyperglycemia, with long-term current use of insulin (HCC) She will continue medication as prescribed, to decrease foods/beverages high in sugars and carbs and follow Heart Healthy or DASH diet. Increase physical activity to at least 30 minutes cardio exercise daily.  - Urinalysis Dipstick - POC Glucose (CBG) - POC HgB A1c  2. Hemoglobin A1C greater than 9%, indicating poor diabetic control Hgb A1c at 12.2 today, from 10.4 on 05/02/2020.   3. Hyperglycemia  4. Panniuclitis with abdominal wall abscess s/p panniculectomy 05/02/2020 Stable. Well-healed abdominal wound. She will follow up with as needed.   5. Generalized abdominal pain  6. Hypertension, unspecified type The current medical regimen is effective; blood pressure is stable at 140/76 today; continue present plan and medications as prescribed. She will continue to take medications as prescribed, to decrease high sodium intake, excessive alcohol intake, increase potassium intake, smoking cessation, and increase physical activity of at least 30 minutes of cardio activity daily. She will continue to follow Heart Healthy or DASH diet.  7. Shortness of breath Stable. No signs or symptoms of respiratory distress noted or reported. Monitor.   8. Cough  9. Weight loss She has a 10 lb weight loss within 5 months.   10. Obesity, Class III, BMI 40-49.9 (morbid obesity) (HCC) Body mass index is 43.36 kg/m.  Goal BMI  is <30. Encouraged efforts to reduce weight include engaging in physical activity as tolerated  with goal of 150 minutes per week. Improve dietary choices and eat a meal regimen consistent with a Mediterranean or DASH diet. Reduce simple carbohydrates. Do not skip meals and eat healthy snacks throughout the day to avoid over-eating at dinner. Set a goal weight loss that is achievable for you.  11. Tobacco use  12. History of recent fall  13. Follow up She will follow up in 6 months.   No orders of the defined types were placed in this encounter.   Orders Placed This Encounter  Procedures  . Urinalysis Dipstick  . POC Glucose (CBG)  . POC HgB A1c    Referral Orders  No referral(s) requested today    Natalie Stroud,  MSN, FNP-BC [facility] [address]  [place], [place] [address] [phone] fax   Problem List Items Addressed This Visit      Endocrine   Type 2 diabetes mellitus with hyperglycemia (HCC) - Primary   Relevant Orders   Urinalysis Dipstick   POC Glucose (CBG) (Completed)   POC HgB A1c (Completed)     Other   Hyperglycemia   Obesity, Class III, BMI 40-49.9 (morbid obesity) (HCC)   Panniuclitis with abdominal wall abscess s/p panniculectomy 05/02/2020    Other Visit Diagnoses    Hemoglobin A1C greater than 9%, indicating poor diabetic control         Generalized abdominal pain       Hypertension, unspecified type       Shortness of breath       Cough       Weight loss       Tobacco use       History of recent fall       Follow up          No orders of the defined types were placed in this encounter.   Follow-up: Return in about 6 months (around 01/31/2021).    Azzie Glatter, FNP

## 2020-08-08 ENCOUNTER — Encounter: Payer: Self-pay | Admitting: Family Medicine

## 2020-08-10 ENCOUNTER — Other Ambulatory Visit: Payer: Self-pay | Admitting: Family Medicine

## 2020-08-10 DIAGNOSIS — R7309 Other abnormal glucose: Secondary | ICD-10-CM

## 2020-08-10 DIAGNOSIS — Z794 Long term (current) use of insulin: Secondary | ICD-10-CM

## 2020-08-10 DIAGNOSIS — E1165 Type 2 diabetes mellitus with hyperglycemia: Secondary | ICD-10-CM

## 2020-08-10 MED ORDER — GLIPIZIDE 10 MG PO TABS: 10.0000 mg | ORAL_TABLET | Freq: Two times a day (BID) | ORAL | 3 refills | Status: DC

## 2020-08-10 MED ORDER — INSULIN GLARGINE 100 UNIT/ML ~~LOC~~ SOLN: 20.0000 [IU] | Freq: Every day | SUBCUTANEOUS | 3 refills | Status: DC

## 2020-08-10 MED ORDER — METFORMIN HCL 1000 MG PO TABS: 1000.0000 mg | ORAL_TABLET | Freq: Two times a day (BID) | ORAL | 3 refills | Status: DC

## 2020-11-09 ENCOUNTER — Ambulatory Visit: Payer: BC Managed Care – PPO | Admitting: Family Medicine

## 2021-01-31 ENCOUNTER — Ambulatory Visit: Payer: BC Managed Care – PPO | Admitting: Family Medicine

## 2021-01-31 ENCOUNTER — Ambulatory Visit: Payer: BC Managed Care – PPO | Admitting: Nurse Practitioner

## 2021-02-02 ENCOUNTER — Telehealth (INDEPENDENT_AMBULATORY_CARE_PROVIDER_SITE_OTHER): Payer: BC Managed Care – PPO | Admitting: Nurse Practitioner

## 2021-02-02 ENCOUNTER — Encounter: Payer: Self-pay | Admitting: Nurse Practitioner

## 2021-02-02 DIAGNOSIS — Z794 Long term (current) use of insulin: Secondary | ICD-10-CM

## 2021-02-02 DIAGNOSIS — Z720 Tobacco use: Secondary | ICD-10-CM | POA: Diagnosis not present

## 2021-02-02 DIAGNOSIS — E1165 Type 2 diabetes mellitus with hyperglycemia: Secondary | ICD-10-CM | POA: Diagnosis not present

## 2021-02-02 DIAGNOSIS — I10 Essential (primary) hypertension: Secondary | ICD-10-CM | POA: Diagnosis not present

## 2021-02-02 MED ORDER — FREESTYLE LIBRE 2 SENSOR MISC: 1.0000 "application " | 11 refills | Status: DC

## 2021-02-02 MED ORDER — FREESTYLE LIBRE 2 READER DEVI: 1.0000 "application " | 11 refills | Status: DC

## 2021-02-02 NOTE — Progress Notes (Signed)
   [facility] [address] [place], [place]  [address] Phone:  [phone]   Fax:  [phone] Virtual Visit via Video Note  I connected with Carolyn Zimmerman on 02/02/21 at  3:00 PM EDT by video and verified that I am speaking with the correct person using two identifiers.   I discussed the limitations, risks, security and privacy concerns of performing an evaluation and management service by vodeo and the availability of in person appointments. I also discussed with the patient that there may be a patient responsible charge related to this service. The patient expressed understanding and agreed to proceed.  Patient home Provider Office  History of Present Illness:  Diabetes Mellitus Patient presents for follow up of diabetes. Current symptoms include: hyperglycemia. Symptoms have gradually worsened. She is not monitoring her CBG regularly. She reports the use of her medication.  Observations/Objective: No exam virtual visit  Assessment and Plan: Assessment  Primary Diagnosis & Pertinent Problem List: The primary encounter diagnosis was Type 2 diabetes mellitus with hyperglycemia, with long-term current use of insulin (HCC). Diagnoses of Hypertension, unspecified type, Obesity, Class III, BMI 40-49.9 (morbid obesity) (HCC), and Tobacco use were also pertinent to this visit.  Visit Diagnosis: 1. Type 2 diabetes mellitus with hyperglycemia, with long-term current use of insulin (HCC)  Due to current use of insulin and the decreased desire to obtain blood glucose regularly encouraged freestyle libre continuous glucose monitoring  2. Hypertension, unspecified type  Unable to assess due to virtual Encouraged on going compliance with current medication regimen Encouraged home monitoring and recording BP <130/80 Eating a heart-healthy diet with less salt Encouraged regular physical activity  Recommend Weight loss  3. Obesity, Class III, BMI 40-49.9 (morbid obesity)  (HCC)      Follow Up Instructions:    I discussed the assessment and treatment plan with the patient. The patient was provided an opportunity to ask questions and all were answered. The patient agreed with the plan and demonstrated an understanding of the instructions.   The patient was advised to call back or seek an in-person evaluation if the symptoms worsen or if the condition fails to improve as anticipated.  I provided 13 minutes of video- face-to-face time during this encounter.   Barbette Merino, NP

## 2021-04-27 ENCOUNTER — Other Ambulatory Visit: Payer: Self-pay

## 2021-04-27 ENCOUNTER — Encounter (HOSPITAL_BASED_OUTPATIENT_CLINIC_OR_DEPARTMENT_OTHER): Payer: Self-pay

## 2021-04-27 ENCOUNTER — Emergency Department (HOSPITAL_BASED_OUTPATIENT_CLINIC_OR_DEPARTMENT_OTHER): Payer: Self-pay

## 2021-04-27 ENCOUNTER — Emergency Department (HOSPITAL_BASED_OUTPATIENT_CLINIC_OR_DEPARTMENT_OTHER)
Admission: EM | Admit: 2021-04-27 | Discharge: 2021-04-27 | Disposition: A | Discharge status: Home / Self Care | Payer: Self-pay | Attending: Emergency Medicine | Admitting: Emergency Medicine

## 2021-04-27 DIAGNOSIS — Z794 Long term (current) use of insulin: Secondary | ICD-10-CM | POA: Insufficient documentation

## 2021-04-27 DIAGNOSIS — R1013 Epigastric pain: Secondary | ICD-10-CM | POA: Insufficient documentation

## 2021-04-27 DIAGNOSIS — I10 Essential (primary) hypertension: Secondary | ICD-10-CM | POA: Insufficient documentation

## 2021-04-27 DIAGNOSIS — M546 Pain in thoracic spine: Secondary | ICD-10-CM | POA: Insufficient documentation

## 2021-04-27 DIAGNOSIS — R0602 Shortness of breath: Secondary | ICD-10-CM | POA: Insufficient documentation

## 2021-04-27 DIAGNOSIS — M549 Dorsalgia, unspecified: Secondary | ICD-10-CM

## 2021-04-27 DIAGNOSIS — E119 Type 2 diabetes mellitus without complications: Secondary | ICD-10-CM | POA: Insufficient documentation

## 2021-04-27 DIAGNOSIS — Z79899 Other long term (current) drug therapy: Secondary | ICD-10-CM | POA: Insufficient documentation

## 2021-04-27 DIAGNOSIS — Z20822 Contact with and (suspected) exposure to covid-19: Secondary | ICD-10-CM | POA: Insufficient documentation

## 2021-04-27 DIAGNOSIS — Z8616 Personal history of COVID-19: Secondary | ICD-10-CM | POA: Insufficient documentation

## 2021-04-27 DIAGNOSIS — F17210 Nicotine dependence, cigarettes, uncomplicated: Secondary | ICD-10-CM | POA: Insufficient documentation

## 2021-04-27 DIAGNOSIS — Z7984 Long term (current) use of oral hypoglycemic drugs: Secondary | ICD-10-CM | POA: Insufficient documentation

## 2021-04-27 LAB — CBC
HCT: 35.8 % — ABNORMAL LOW (ref 36.0–46.0)
Hemoglobin: 11.3 g/dL — ABNORMAL LOW (ref 12.0–15.0)
MCH: 25 pg — ABNORMAL LOW (ref 26.0–34.0)
MCHC: 31.6 g/dL (ref 30.0–36.0)
MCV: 79.2 fL — ABNORMAL LOW (ref 80.0–100.0)
Platelets: 325 10*3/uL (ref 150–400)
RBC: 4.52 MIL/uL (ref 3.87–5.11)
RDW: 15.6 % — ABNORMAL HIGH (ref 11.5–15.5)
WBC: 11.2 10*3/uL — ABNORMAL HIGH (ref 4.0–10.5)
nRBC: 0 % (ref 0.0–0.2)

## 2021-04-27 LAB — PREGNANCY, URINE: Preg Test, Ur: NEGATIVE

## 2021-04-27 LAB — BASIC METABOLIC PANEL
Anion gap: 8 (ref 5–15)
BUN: 13 mg/dL (ref 6–20)
CO2: 24 mmol/L (ref 22–32)
Calcium: 8.6 mg/dL — ABNORMAL LOW (ref 8.9–10.3)
Chloride: 103 mmol/L (ref 98–111)
Creatinine, Ser: 0.75 mg/dL (ref 0.44–1.00)
GFR, Estimated: >60 mL/min (ref >60)
Glucose, Bld: 218 mg/dL — ABNORMAL HIGH (ref 70–99)
Potassium: 3.6 mmol/L (ref 3.5–5.1)
Sodium: 135 mmol/L (ref 135–145)

## 2021-04-27 LAB — RESP PANEL BY RT-PCR (FLU A&B, COVID) ARPGX2
Influenza A by PCR: NEGATIVE
Influenza B by PCR: NEGATIVE
SARS Coronavirus 2 by RT PCR: NEGATIVE

## 2021-04-27 LAB — TROPONIN I (HIGH SENSITIVITY): Troponin I (High Sensitivity): 14 ng/L (ref <18)

## 2021-04-27 MED ORDER — LISINOPRIL 10 MG PO TABS
10.0000 mg | ORAL_TABLET | Freq: Once | ORAL | Status: AC
  Administered 2021-04-27: 10 mg via ORAL
  Filled 2021-04-27: qty 1

## 2021-04-27 MED ORDER — DIPHENHYDRAMINE HCL 50 MG/ML IJ SOLN: 50.0000 mg | Freq: Once | INTRAMUSCULAR | Status: DC

## 2021-04-27 MED ORDER — KETOROLAC TROMETHAMINE 15 MG/ML IJ SOLN
15.0000 mg | Freq: Once | INTRAMUSCULAR | Status: AC
  Administered 2021-04-27: 15 mg via INTRAVENOUS
  Filled 2021-04-27: qty 1

## 2021-04-27 MED ORDER — NITROGLYCERIN 2 % TD OINT
1.0000 [in_us] | TOPICAL_OINTMENT | Freq: Once | TRANSDERMAL | Status: AC
  Administered 2021-04-27: 1 [in_us] via TOPICAL
  Filled 2021-04-27: qty 1

## 2021-04-27 MED ORDER — HYDROCORTISONE NA SUCCINATE PF 250 MG IJ SOLR: 200.0000 mg | Freq: Once | INTRAMUSCULAR | Status: DC

## 2021-04-27 MED ORDER — CYCLOBENZAPRINE HCL 10 MG PO TABS
10.0000 mg | ORAL_TABLET | Freq: Once | ORAL | Status: AC
  Administered 2021-04-27: 10 mg via ORAL
  Filled 2021-04-27: qty 1

## 2021-04-27 MED ORDER — HYDROCORTISONE NA SUCCINATE PF 100 MG IJ SOLR
200.0000 mg | Freq: Once | INTRAMUSCULAR | Status: AC
  Administered 2021-04-27: 200 mg via INTRAVENOUS
  Filled 2021-04-27: qty 4

## 2021-04-27 MED ORDER — LABETALOL HCL 5 MG/ML IV SOLN
20.0000 mg | Freq: Once | INTRAVENOUS | Status: AC
  Administered 2021-04-27: 20 mg via INTRAVENOUS
  Filled 2021-04-27: qty 4

## 2021-04-27 MED ORDER — LIDOCAINE 5 % EX PTCH
1.0000 | MEDICATED_PATCH | CUTANEOUS | Status: DC
  Administered 2021-04-27: 1 via TRANSDERMAL
  Filled 2021-04-27: qty 1

## 2021-04-27 MED ORDER — AMLODIPINE BESYLATE 5 MG PO TABS
10.0000 mg | ORAL_TABLET | Freq: Once | ORAL | Status: AC
  Administered 2021-04-27: 10 mg via ORAL
  Filled 2021-04-27: qty 2

## 2021-04-27 MED ORDER — DIPHENHYDRAMINE HCL 25 MG PO CAPS: 50.0000 mg | ORAL_CAPSULE | Freq: Once | ORAL | Status: DC

## 2021-04-27 NOTE — ED Triage Notes (Addendum)
Pt c/o upper back pain x 3 days. States started having abdominal pain today.   Adds that she has had dyspnea on exertion.

## 2021-04-27 NOTE — ED Provider Notes (Signed)
Patient expressed that she no longer wants to be in the ER anymore.  She states her pain is improved and declines the angiogram study.  Her blood pressure still remains in the 200s despite multiple doses of blood pressure medication including IV medication.  I explained the risk of her severely elevated high blood pressure which can even require ICU admission.  Patient adamantly declines further evaluation, further medication, further studies.  She states that she prefers to follow-up on an outpatient basis.  I advised her that this may not be possible and that she may suffer serious illness even death before being able to follow-up with anybody.  Patient is awake and alert has decision-making capacity.  Continues to refuse care demands to be released to go home.  Will be leaving AGAINST MEDICAL ADVICE.  I encouraged her to return at any time should she change her mind or if she would like further medical care and return back to the ER.  Otherwise advised her to call her doctor today.  Patient leaving AGAINST MEDICAL ADVICE.   Cheryll Cockayne, MD 04/27/21 [phone]

## 2021-04-27 NOTE — ED Notes (Signed)
Patient reports she wants to go home instead of having ct scan.  Encouraged to stay without success.  Explained risk of leaving against medical advise.  Provider made aware.  Left at this time.

## 2021-04-27 NOTE — ED Notes (Signed)
Patient updated on plan of care.  Reports back pain is currently down to 2/10.  Continues to have elevated bp.  Verbalized understanding behind getting CT scan despite pain reduction.

## 2021-04-27 NOTE — ED Provider Notes (Signed)
[facility] EMERGENCY DEPARTMENT Provider Note   CSN: [id number] Arrival date & time: 04/27/21  1147     History Chief Complaint  Patient presents with   Shortness of Breath   Back Pain    Carolyn Zimmerman is a 34 y.o. female.   Shortness of Breath Associated symptoms: abdominal pain and chest pain   Associated symptoms: no cough, no ear pain, no fever, no neck pain, no rash, no sore throat and no vomiting   Back Pain Associated symptoms: abdominal pain and chest pain   Associated symptoms: no dysuria and no fever   Patient presents for upper back pain for the past 3 days.  She developed abdominal pain today.  She has had exertional dyspnea.  Medical history is notable for hypertension and diabetes.  She has not been taking her hypertension medications lately.  1 year ago, she was admitted to the hospital for panniculitis with abdominal abscess.  She underwent a panniculectomy.  She states that she still occasionally gets sanguinous drainage from the surgical site.  She has chronic pain in the area of her surgical wounds.  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  She works long shifts and is on her feet frequently.  She has chronic soreness from this.  Over the last 3 days, however, she has developed a pain in the upper area of her back, between her shoulder blades.  She has been treating this pain at home with Tylenol.  Her last dose of Tylenol was 1.5 hours prior to arrival.  Pain is been persistent over the past 3 days.  Additionally, she developed generalized abdominal pain today.  She has had soft stools.  She also endorses pleuritic back pain.    Past Medical History:  Diagnosis Date   Abscess of chest wall 06/15/2017   Coronavirus infection 05/02/2020   Diabetes mellitus    Hypertension    Jaundice    Obesity    TOA (tubo-ovarian abscess) 09/2019    Patient Active Problem List   Diagnosis Date Noted   Panniuclitis with abdominal wall abscess s/p  panniculectomy 05/02/2020 05/02/2020   Obesity, Class III, BMI 40-49.9 (morbid obesity) (HCC) 05/02/2020   COVID-19 virus infection 05/02/2020   Panniculitis abdominal wall 05/02/2020   Necrotizing fasciitis (HCC) 05/02/2020   Sepsis (HCC) 05/02/2020   Hidradenitis suppurativa of axillae,chest wall, groins 05/02/2020   Type 2 diabetes mellitus with hyperglycemia (HCC) 10/18/2019   TOA (tubo-ovarian abscess) 10/17/2019   Elevated LFTs 09/16/2018   DM (diabetes mellitus), type 2, uncontrolled (HCC) 09/16/2018   Uncontrolled hypertension 09/16/2018   Increased frequency of urination 09/16/2018   Bile duct stricture 07/30/2018   Chest wall abscesses x 2 s/p I&D 05/02/2020 06/15/2017   Hyperglycemia 06/15/2017   Hx of medication noncompliance     Past Surgical History:  Procedure Laterality Date   CHOLECYSTECTOMY     IRRIGATION AND DEBRIDEMENT ABSCESS N/A 05/02/2020   Procedure: IRRIGATION AND DEBRIDEMENT ABDOMINAL and CHEST WALL NECROTIZING FASCITITS;  Surgeon: Gross, Steven, MD;  Location: [facility];  Service: General;  Laterality: N/A;   liver stent       OB History   No obstetric history on file.     Family History  Problem Relation Age of Onset   Hypertension Mother    Diabetes Father    Hypertension Father     Social History   Tobacco Use   Smoking status: Every Day    Packs/day: 0.50    Types: Cigarettes     Smokeless tobacco: Never  Vaping Use   Vaping Use: Former  Substance Use Topics   Alcohol use: Not Currently    Comment: occ   Drug use: No    Home Medications Prior to Admission medications   Medication Sig Start Date End Date Taking? Authorizing Provider  B-D ULTRAFINE III SHORT PEN 31G X 8 MM MISC See admin instructions. 09/04/20  Yes [provider]  blood glucose meter kit and supplies Dispense based on patient and insurance preference. Use up to four times daily as directed. (FOR ICD-9 250.00, 250.01). 06/02/20  Yes Azzie Glatter, FNP   Continuous Blood Gluc Receiver (FREESTYLE LIBRE 2 READER) DEVI 1 application by Does not apply route every 14 (fourteen) days. 02/02/21  Yes Vevelyn Francois, NP  Continuous Blood Gluc Sensor (FREESTYLE LIBRE 2 SENSOR) MISC 1 application by Does not apply route every 14 (fourteen) days. 02/02/21  Yes Vevelyn Francois, NP  insulin aspart protamine- aspart (NOVOLOG MIX 70/30) (70-30) 100 UNIT/ML injection Inject 0.6 mLs (60 Units total) into the skin 2 (two) times daily with a meal. 05/31/20 05/26/21 Yes Azzie Glatter, FNP  Insulin Syringe-Needle U-100 (INSULIN SYRINGE .3CC/31GX5/16") 31G X 5/16" 0.3 ML MISC Use with insulin, twice daily 05/12/20  Yes Rai, Ripudeep K, MD  acetaminophen (TYLENOL) 500 MG tablet Take 2 tablets (1,000 mg total) by mouth every 8 (eight) hours as needed for mild pain. Patient not taking: No sig reported 06/02/20   Azzie Glatter, FNP  albuterol (VENTOLIN HFA) 108 (90 Base) MCG/ACT inhaler Inhale 2 puffs into the lungs every 4 (four) hours as needed for wheezing or shortness of breath. Patient not taking: No sig reported 06/02/20   Azzie Glatter, FNP  amLODipine (NORVASC) 10 MG tablet Take 1 tablet (10 mg total) by mouth daily. Patient not taking: No sig reported 06/02/20 05/28/21  Azzie Glatter, FNP  gabapentin (NEURONTIN) 400 MG capsule Take 1 capsule (400 mg total) by mouth 3 (three) times daily as needed. Patient not taking: No sig reported 06/02/20   Azzie Glatter, FNP  glipiZIDE (GLUCOTROL) 10 MG tablet Take 1 tablet (10 mg total) by mouth 2 (two) times daily before a meal. Patient not taking: No sig reported 08/10/20   Azzie Glatter, FNP  hydrochlorothiazide (HYDRODIURIL) 25 MG tablet Take 1 tablet (25 mg total) by mouth daily. Patient not taking: No sig reported 06/02/20   Azzie Glatter, FNP  lisinopril (ZESTRIL) 10 MG tablet Take 1 tablet (10 mg total) by mouth daily. Patient not taking: No sig reported 07/06/20   Azzie Glatter, FNP  metFORMIN  (GLUCOPHAGE) 1000 MG tablet Take 1 tablet (1,000 mg total) by mouth 2 (two) times daily with a meal. Patient not taking: No sig reported 08/10/20   Azzie Glatter, FNP  psyllium (HYDROCIL/METAMUCIL) 95 % PACK Take 1 packet by mouth 2 (two) times daily. Patient not taking: No sig reported 05/12/20   Rai, Vernelle Emerald, MD    Allergies    Contrast media [iodinated diagnostic agents]  Review of Systems   Review of Systems  Constitutional:  Negative for activity change, appetite change, chills, fatigue and fever.  HENT:  Negative for ear pain and sore throat.   Eyes:  Negative for pain and visual disturbance.  Respiratory:  Positive for shortness of breath. Negative for cough.   Cardiovascular:  Positive for chest pain. Negative for palpitations and leg swelling.  Gastrointestinal:  Positive for abdominal pain. Negative for blood  in stool, constipation, nausea and vomiting.  Genitourinary:  Negative for dysuria and hematuria.  Musculoskeletal:  Positive for back pain. Negative for arthralgias, gait problem, joint swelling, neck pain and neck stiffness.  Skin:  Negative for color change and rash.  Neurological:  Negative for seizures and syncope.  All other systems reviewed and are negative.  Physical Exam Updated Vital Signs BP (!) 214/129   Pulse 79   Temp 98.7 F (37.1 C) (Oral)   Resp (!) 26   Ht 5' 8" (1.727 m)   Wt 131.5 kg   LMP  (LMP Unknown)   SpO2 100%   BMI 44.09 kg/m   Physical Exam Vitals and nursing note reviewed.  Constitutional:      General: She is not in acute distress.    Appearance: She is well-developed. She is not ill-appearing, toxic-appearing or diaphoretic.  HENT:     Head: Normocephalic and atraumatic.     Mouth/Throat:     Mouth: Mucous membranes are moist.     Pharynx: Oropharynx is clear.  Eyes:     Extraocular Movements: Extraocular movements intact.     Conjunctiva/sclera: Conjunctivae normal.  Neck:     Vascular: No JVD.  Cardiovascular:      Rate and Rhythm: Normal rate and regular rhythm.     Heart sounds: No murmur heard. Pulmonary:     Effort: Pulmonary effort is normal. No accessory muscle usage or respiratory distress.     Breath sounds: Normal breath sounds. No decreased breath sounds, wheezing, rhonchi or rales.  Chest:     Chest wall: Tenderness present.     Comments: Wounds on anterior chest from previous I&D.  Areas of intertrigo under breasts. Abdominal:     Palpations: Abdomen is soft.     Tenderness: There is abdominal tenderness (Epigastrium).  Musculoskeletal:     Cervical back: Normal range of motion and neck supple.     Thoracic back: Tenderness present.       Back:     Right lower leg: No edema.     Left lower leg: No edema.  Skin:    General: Skin is warm and dry.  Neurological:     General: No focal deficit present.     Mental Status: She is alert and oriented to person, place, and time.     Cranial Nerves: No cranial nerve deficit.     Motor: No weakness.  Psychiatric:        Mood and Affect: Mood normal.        Behavior: Behavior normal.    ED Results / Procedures / Treatments   Labs (all labs ordered are listed, but only abnormal results are displayed) Labs Reviewed  BASIC METABOLIC PANEL - Abnormal; Notable for the following components:      Result Value   Glucose, Bld 218 (*)    Calcium 8.6 (*)    All other components within normal limits  CBC - Abnormal; Notable for the following components:   WBC 11.2 (*)    Hemoglobin 11.3 (*)    HCT 35.8 (*)    MCV 79.2 (*)    MCH 25.0 (*)    RDW 15.6 (*)    All other components within normal limits  RESP PANEL BY RT-PCR (FLU A&B, COVID) ARPGX2  PREGNANCY, URINE  TROPONIN I (HIGH SENSITIVITY)  TROPONIN I (HIGH SENSITIVITY)    EKG EKG Interpretation  Date/Time:  Wednesday April 27 2021 12:04:39 EDT Ventricular Rate:  87 PR Interval:    170 QRS Duration: 106 QT Interval:  396 QTC Calculation: 476 R Axis:   13 Text  Interpretation: Normal sinus rhythm Possible Left atrial enlargement Left ventricular hypertrophy with repolarization abnormality ( Cornell product ) Abnormal ECG Confirmed by Godfrey Pick [phone]) on 04/27/2021 12:36:46 PM  Radiology DG Chest 2 View  Result Date: 04/27/2021 CLINICAL DATA:  Dyspnea on exertion. EXAM: CHEST - 2 VIEW COMPARISON:  Chest x-ray dated May 02, 2020. FINDINGS: Unchanged cardiomegaly. Mild pulmonary vascular congestion. Minimal linear atelectasis in the lingula. No focal consolidation, pleural effusion, or pneumothorax. No acute osseous abnormality. IMPRESSION: 1. Mild pulmonary vascular congestion without overt edema. Electronically Signed   By: Titus Dubin M.D.   On: 04/27/2021 12:55    Procedures Procedures   Medications Ordered in ED Medications  diphenhydrAMINE (BENADRYL) capsule 50 mg (has no administration in time range)    Or  diphenhydrAMINE (BENADRYL) injection 50 mg (has no administration in time range)  lidocaine (LIDODERM) 5 % 1 patch (1 patch Transdermal Patch Applied 04/27/21 1530)  amLODipine (NORVASC) tablet 10 mg (10 mg Oral Given 04/27/21 1520)  lisinopril (ZESTRIL) tablet 10 mg (10 mg Oral Given 04/27/21 1520)  nitroGLYCERIN (NITROGLYN) 2 % ointment 1 inch (1 inch Topical Given 04/27/21 1521)  hydrocortisone sodium succinate (SOLU-CORTEF) 100 MG injection 200 mg (200 mg Intravenous Given 04/27/21 1514)  ketorolac (TORADOL) 15 MG/ML injection 15 mg (15 mg Intravenous Given 04/27/21 1532)  cyclobenzaprine (FLEXERIL) tablet 10 mg (10 mg Oral Given 04/27/21 1532)  labetalol (NORMODYNE) injection 20 mg (20 mg Intravenous Given 04/27/21 1639)    ED Course  I have reviewed the triage vital signs and the nursing notes.  Pertinent labs & imaging results that were available during my care of the patient were reviewed by me and considered in my medical decision making (see chart for details).    MDM Rules/Calculators/A&P                           Patient  is a 35 year old female with history of diabetes and hypertension, not currently taking her antihypertensive medications, who presents for 3 days of worsening back pain.  Back pain is located in the area between her shoulder blades.  She denies any recent history of injury or straining.  Additionally, she developed some shortness of breath, abdominal pain, and chest pain today.  Due to these symptoms, patient presents to the ED.  Vital signs upon arrival notable for significant hypertension.  Heart rate is normal.  On exam, patient is overall well-appearing.  Her breathing is even unlabored.  Her lungs are clear to auscultation.  The area of chest pain is described as the vicinity of her wounds from previous incision and drainage.  This pain seems to be chronic.  Patient has developed some mild abdominal pain today.  She has some tenderness in the epigastric area.  I do not appreciate any pulsatile mass.  Nitropaste and patient's home blood pressure medications were ordered for initial management of her elevated blood pressure.  Given the tenderness in the area of her back pain, in addition to worsened pain with movement, I feel that etiology is likely musculoskeletal.  However, patient is at risk for aortic dissection.  I am concerned about the development of abdominal pain today.  Laboratory work-up was initiated.  Patient has a listed allergy to iodinated contrast media.  Per chart review, the severity of this reaction was low, however, given his history, pretreatment  medications (Solu-Medrol and Benadryl) were ordered in anticipation of likely CT angiogram.  I discussed with the patient regarding plan here in the ED.  Patient was made aware that she will need to wait for 4 hours following the pretreatment medications in order to get her CT scan.  In the meantime, we will treat musculoskeletal pain with Toradol, Flexeril, and lidocaine patch.  Management of pain will likely assist in lowering her blood pressure  as well.  Initial labs were notable for a mild leukocytosis.  Care of patient was signed out to oncoming ED provider.  Final Clinical Impression(s) / ED Diagnoses Final diagnoses:  Upper back pain  Hypertension, unspecified type    Rx / DC Orders ED Discharge Orders     None        Godfrey Pick, MD 04/27/21 2004

## 2021-08-13 ENCOUNTER — Emergency Department (HOSPITAL_BASED_OUTPATIENT_CLINIC_OR_DEPARTMENT_OTHER): Payer: Self-pay

## 2021-08-13 ENCOUNTER — Encounter (HOSPITAL_BASED_OUTPATIENT_CLINIC_OR_DEPARTMENT_OTHER): Payer: Self-pay | Admitting: Emergency Medicine

## 2021-08-13 ENCOUNTER — Emergency Department (HOSPITAL_BASED_OUTPATIENT_CLINIC_OR_DEPARTMENT_OTHER)
Admission: EM | Admit: 2021-08-13 | Discharge: 2021-08-13 | Disposition: A | Discharge status: Home / Self Care | Payer: Self-pay | Attending: Emergency Medicine | Admitting: Emergency Medicine

## 2021-08-13 ENCOUNTER — Other Ambulatory Visit: Payer: Self-pay

## 2021-08-13 DIAGNOSIS — Z8616 Personal history of COVID-19: Secondary | ICD-10-CM | POA: Insufficient documentation

## 2021-08-13 DIAGNOSIS — Z79899 Other long term (current) drug therapy: Secondary | ICD-10-CM | POA: Insufficient documentation

## 2021-08-13 DIAGNOSIS — R0609 Other forms of dyspnea: Secondary | ICD-10-CM

## 2021-08-13 DIAGNOSIS — F17210 Nicotine dependence, cigarettes, uncomplicated: Secondary | ICD-10-CM | POA: Insufficient documentation

## 2021-08-13 DIAGNOSIS — Z794 Long term (current) use of insulin: Secondary | ICD-10-CM | POA: Insufficient documentation

## 2021-08-13 DIAGNOSIS — R0789 Other chest pain: Secondary | ICD-10-CM | POA: Insufficient documentation

## 2021-08-13 DIAGNOSIS — Z20822 Contact with and (suspected) exposure to covid-19: Secondary | ICD-10-CM | POA: Insufficient documentation

## 2021-08-13 DIAGNOSIS — R0602 Shortness of breath: Secondary | ICD-10-CM | POA: Insufficient documentation

## 2021-08-13 DIAGNOSIS — M7989 Other specified soft tissue disorders: Secondary | ICD-10-CM | POA: Insufficient documentation

## 2021-08-13 DIAGNOSIS — Z7984 Long term (current) use of oral hypoglycemic drugs: Secondary | ICD-10-CM | POA: Insufficient documentation

## 2021-08-13 DIAGNOSIS — E119 Type 2 diabetes mellitus without complications: Secondary | ICD-10-CM | POA: Insufficient documentation

## 2021-08-13 DIAGNOSIS — I10 Essential (primary) hypertension: Secondary | ICD-10-CM | POA: Insufficient documentation

## 2021-08-13 LAB — TROPONIN I (HIGH SENSITIVITY)
Troponin I (High Sensitivity): 14 ng/L (ref <18)
Troponin I (High Sensitivity): 18 ng/L — ABNORMAL HIGH (ref <18)

## 2021-08-13 LAB — COMPREHENSIVE METABOLIC PANEL
ALT: 19 U/L (ref 0–44)
AST: 18 U/L (ref 15–41)
Albumin: 3.4 g/dL — ABNORMAL LOW (ref 3.5–5.0)
Alkaline Phosphatase: 96 U/L (ref 38–126)
Anion gap: 8 (ref 5–15)
BUN: 14 mg/dL (ref 6–20)
CO2: 21 mmol/L — ABNORMAL LOW (ref 22–32)
Calcium: 8.3 mg/dL — ABNORMAL LOW (ref 8.9–10.3)
Chloride: 105 mmol/L (ref 98–111)
Creatinine, Ser: 0.74 mg/dL (ref 0.44–1.00)
GFR, Estimated: >60 mL/min (ref >60)
Glucose, Bld: 275 mg/dL — ABNORMAL HIGH (ref 70–99)
Potassium: 3.9 mmol/L (ref 3.5–5.1)
Sodium: 134 mmol/L — ABNORMAL LOW (ref 135–145)
Total Bilirubin: 0.4 mg/dL (ref 0.3–1.2)
Total Protein: 7.4 g/dL (ref 6.5–8.1)

## 2021-08-13 LAB — CBC WITH DIFFERENTIAL/PLATELET
Abs Immature Granulocytes: 0.03 10*3/uL (ref 0.00–0.07)
Basophils Absolute: 0.1 10*3/uL (ref 0.0–0.1)
Basophils Relative: 1 %
Eosinophils Absolute: 0.2 10*3/uL (ref 0.0–0.5)
Eosinophils Relative: 2 %
HCT: 36.5 % (ref 36.0–46.0)
Hemoglobin: 11.5 g/dL — ABNORMAL LOW (ref 12.0–15.0)
Immature Granulocytes: 0 %
Lymphocytes Relative: 32 %
Lymphs Abs: 3.8 10*3/uL (ref 0.7–4.0)
MCH: 25.7 pg — ABNORMAL LOW (ref 26.0–34.0)
MCHC: 31.5 g/dL (ref 30.0–36.0)
MCV: 81.7 fL (ref 80.0–100.0)
Monocytes Absolute: 1 10*3/uL (ref 0.1–1.0)
Monocytes Relative: 8 %
Neutro Abs: 7.1 10*3/uL (ref 1.7–7.7)
Neutrophils Relative %: 57 %
Platelets: 227 10*3/uL (ref 150–400)
RBC: 4.47 MIL/uL (ref 3.87–5.11)
RDW: 14.6 % (ref 11.5–15.5)
WBC: 12.2 10*3/uL — ABNORMAL HIGH (ref 4.0–10.5)
nRBC: 0 % (ref 0.0–0.2)

## 2021-08-13 LAB — BRAIN NATRIURETIC PEPTIDE: B Natriuretic Peptide: 340.7 pg/mL — ABNORMAL HIGH (ref 0.0–100.0)

## 2021-08-13 LAB — CBG MONITORING, ED: Glucose-Capillary: 263 mg/dL — ABNORMAL HIGH (ref 70–99)

## 2021-08-13 LAB — RESP PANEL BY RT-PCR (FLU A&B, COVID) ARPGX2
Influenza A by PCR: NEGATIVE
Influenza B by PCR: NEGATIVE
SARS Coronavirus 2 by RT PCR: NEGATIVE

## 2021-08-13 MED ORDER — FUROSEMIDE 10 MG/ML IJ SOLN
40.0000 mg | Freq: Once | INTRAMUSCULAR | Status: AC
  Administered 2021-08-13: 40 mg via INTRAVENOUS
  Filled 2021-08-13: qty 4

## 2021-08-13 MED ORDER — AMLODIPINE BESYLATE 10 MG PO TABS: 10.0000 mg | ORAL_TABLET | Freq: Every day | ORAL | 3 refills | Status: DC

## 2021-08-13 MED ORDER — FUROSEMIDE 40 MG PO TABS: 40.0000 mg | ORAL_TABLET | Freq: Every day | ORAL | 1 refills | Status: DC

## 2021-08-13 NOTE — ED Notes (Signed)
Patient discharged to home.  All discharge instructions reviewed.  Patient verbalized understanding via teachback method.  VS WDL.  Respirations even and unlabored.  Ambulatory out of ED.   °

## 2021-08-13 NOTE — ED Provider Notes (Signed)
[place] EMERGENCY DEPARTMENT Provider Note   CSN: [id number] Arrival date & time: 08/13/21  0141     History No chief complaint on file.   Carolyn Zimmerman is a 35 y.o. female.  Patient is a 35 year old female with past medical history of diabetes, hypertension.  Patient presenting today for evaluation of shortness of breath.  This has been constant and progressing for the past week.  She describes shortness of breath with lying flat and with ambulation.  She denies any fevers, chills, or cough.  She feels tight in the chest.  It appears as though she is noncompliant with her antihypertensive medications and presents here markedly hypertensive.  She also reports increased swelling of both lower legs.  The history is provided by the patient.      Past Medical History:  Diagnosis Date   Abscess of chest wall 06/15/2017   Coronavirus infection 05/02/2020   Diabetes mellitus    Hypertension    Jaundice    Obesity    TOA (tubo-ovarian abscess) 09/2019    Patient Active Problem List   Diagnosis Date Noted   Panniuclitis with abdominal wall abscess s/p panniculectomy 05/02/2020 05/02/2020   Obesity, Class III, BMI 40-49.9 (morbid obesity) ([place]) 05/02/2020   COVID-19 virus infection 05/02/2020   Panniculitis abdominal wall 05/02/2020   Necrotizing fasciitis ([place]) 05/02/2020   Sepsis ([place]) 05/02/2020   Hidradenitis suppurativa of axillae,chest wall, groins 05/02/2020   Type 2 diabetes mellitus with hyperglycemia ([place]) 10/18/2019   TOA (tubo-ovarian abscess) 10/17/2019   Elevated LFTs 09/16/2018   DM (diabetes mellitus), type 2, uncontrolled 09/16/2018   Uncontrolled hypertension 09/16/2018   Increased frequency of urination 09/16/2018   Bile duct stricture 07/30/2018   Chest wall abscesses x 2 s/p I&D 05/02/2020 06/15/2017   Hyperglycemia 06/15/2017   Hx of medication noncompliance     Past Surgical History:  Procedure Laterality Date   CHOLECYSTECTOMY      IRRIGATION AND DEBRIDEMENT ABSCESS N/A 05/02/2020   Procedure: IRRIGATION AND DEBRIDEMENT ABDOMINAL and CHEST WALL NECROTIZING FASCITITS;  Surgeon: Michael Boston, MD;  Location: [facility];  Service: General;  Laterality: N/A;   liver stent       OB History   No obstetric history on file.     Family History  Problem Relation Age of Onset   Hypertension Mother    Diabetes Father    Hypertension Father     Social History   Tobacco Use   Smoking status: Every Day    Packs/day: 0.50    Types: Cigarettes   Smokeless tobacco: Never  Vaping Use   Vaping Use: Former  Substance Use Topics   Alcohol use: Not Currently    Comment: occ   Drug use: No    Home Medications Prior to Admission medications   Medication Sig Start Date End Date Taking? Authorizing Provider  acetaminophen (TYLENOL) 500 MG tablet Take 2 tablets (1,000 mg total) by mouth every 8 (eight) hours as needed for mild pain. Patient not taking: No sig reported 06/02/20   Azzie Glatter, FNP  albuterol (VENTOLIN HFA) 108 (90 Base) MCG/ACT inhaler Inhale 2 puffs into the lungs every 4 (four) hours as needed for wheezing or shortness of breath. Patient not taking: No sig reported 06/02/20   Azzie Glatter, FNP  amLODipine (NORVASC) 10 MG tablet Take 1 tablet (10 mg total) by mouth daily. Patient not taking: No sig reported 06/02/20 05/28/21  Azzie Glatter, FNP  B-D ULTRAFINE III SHORT PEN  31G X 8 MM MISC See admin instructions. 09/04/20   [provider]  blood glucose meter kit and supplies Dispense based on patient and insurance preference. Use up to four times daily as directed. (FOR ICD-9 250.00, 250.01). 06/02/20   Azzie Glatter, FNP  Continuous Blood Gluc Receiver (FREESTYLE LIBRE 2 READER) DEVI 1 application by Does not apply route every 14 (fourteen) days. 02/02/21   Vevelyn Francois, NP  Continuous Blood Gluc Sensor (FREESTYLE LIBRE 2 SENSOR) MISC 1 application by Does not apply route every 14  (fourteen) days. 02/02/21   Vevelyn Francois, NP  gabapentin (NEURONTIN) 400 MG capsule Take 1 capsule (400 mg total) by mouth 3 (three) times daily as needed. Patient not taking: No sig reported 06/02/20   Azzie Glatter, FNP  glipiZIDE (GLUCOTROL) 10 MG tablet Take 1 tablet (10 mg total) by mouth 2 (two) times daily before a meal. Patient not taking: No sig reported 08/10/20   Azzie Glatter, FNP  hydrochlorothiazide (HYDRODIURIL) 25 MG tablet Take 1 tablet (25 mg total) by mouth daily. Patient not taking: No sig reported 06/02/20   Azzie Glatter, FNP  insulin aspart protamine- aspart (NOVOLOG MIX 70/30) (70-30) 100 UNIT/ML injection Inject 0.6 mLs (60 Units total) into the skin 2 (two) times daily with a meal. 05/31/20 05/26/21  Azzie Glatter, FNP  Insulin Syringe-Needle U-100 (INSULIN SYRINGE .3CC/31GX5/16") 31G X 5/16" 0.3 ML MISC Use with insulin, twice daily 05/12/20   Rai, Ripudeep K, MD  lisinopril (ZESTRIL) 10 MG tablet Take 1 tablet (10 mg total) by mouth daily. Patient not taking: No sig reported 07/06/20   Azzie Glatter, FNP  metFORMIN (GLUCOPHAGE) 1000 MG tablet Take 1 tablet (1,000 mg total) by mouth 2 (two) times daily with a meal. Patient not taking: No sig reported 08/10/20   Azzie Glatter, FNP  psyllium (HYDROCIL/METAMUCIL) 95 % PACK Take 1 packet by mouth 2 (two) times daily. Patient not taking: No sig reported 05/12/20   Mendel Corning, MD    Allergies    Contrast media [iodinated diagnostic agents]  Review of Systems   Review of Systems  All other systems reviewed and are negative.  Physical Exam Updated Vital Signs BP (!) 194/135 (BP Location: Right Arm)   Pulse 98   Temp 98.2 F (36.8 C) (Oral)   Resp (!) 22   Ht $R'5\' 7"'UT$  (1.702 m)   Wt 132.4 kg   LMP 07/26/2021 (Approximate)   SpO2 100%   BMI 45.72 kg/m   Physical Exam Vitals and nursing note reviewed.  Constitutional:      General: She is not in acute distress.    Appearance: She is  well-developed. She is not diaphoretic.  HENT:     Head: Normocephalic and atraumatic.  Cardiovascular:     Rate and Rhythm: Normal rate and regular rhythm.     Heart sounds: No murmur heard.   No friction rub. No gallop.  Pulmonary:     Effort: Pulmonary effort is normal. No respiratory distress.     Breath sounds: Rales present. No wheezing.     Comments: There are slight rales in the bases bilaterally. Abdominal:     General: Bowel sounds are normal. There is no distension.     Palpations: Abdomen is soft.     Tenderness: There is no abdominal tenderness.  Musculoskeletal:        General: No tenderness. Normal range of motion.     Cervical back:  Normal range of motion and neck supple.     Right lower leg: Edema present.     Left lower leg: Edema present.     Comments: There is 2+ pitting edema both lower extremity.  Skin:    General: Skin is warm and dry.  Neurological:     General: No focal deficit present.     Mental Status: She is alert and oriented to person, place, and time.    ED Results / Procedures / Treatments   Labs (all labs ordered are listed, but only abnormal results are displayed) Labs Reviewed  CBG MONITORING, ED - Abnormal; Notable for the following components:      Result Value   Glucose-Capillary 263 (*)    All other components within normal limits  RESP PANEL BY RT-PCR (FLU A&B, COVID) ARPGX2  COMPREHENSIVE METABOLIC PANEL  CBC WITH DIFFERENTIAL/PLATELET  BRAIN NATRIURETIC PEPTIDE  TROPONIN I (HIGH SENSITIVITY)    EKG EKG Interpretation  Date/Time:  Saturday August 13 2021 01:52:54 EST Ventricular Rate:  95 PR Interval:  173 QRS Duration: 110 QT Interval:  382 QTC Calculation: 481 R Axis:   27 Text Interpretation: Sinus rhythm Probable left atrial enlargement  LVH with repolarization abnormality Unchanged from 04/27/2021 Confirmed by Veryl Speak [phone]) on 08/13/2021 1:56:46 AM  Radiology No results found.  Procedures Procedures    Medications Ordered in ED Medications - No data to display  ED Course  I have reviewed the triage vital signs and the nursing notes.  Pertinent labs & imaging results that were available during my care of the patient were reviewed by me and considered in my medical decision making (see chart for details).    MDM Rules/Calculators/A&P  Patient presenting here with complaints of shortness of breath, worsening over the past week.  This seems worse with exertion and lying flat.  Patient arrives here hypertensive and presentation is most consistent with congestive heart disease.  She does have edema of her legs.  Patient's work-up shows an elevated BNP and cardiomegaly with pulmonary vascular congestion, but no overt edema.  Patient given IV Lasix with a good diuresis and is now feeling much improved.  Her blood pressures are also improving.  At this point, disposition discussed with the patient.  She is comfortable with returning home.  I will have her follow-up with cardiology as an outpatient.  She will likely require echocardiogram and tighter control of her blood pressures.  It does seem as though she has been somewhat noncompliant with her blood pressure medications and is also out of these.  I will refill these medications for her.  Final Clinical Impression(s) / ED Diagnoses Final diagnoses:  None    Rx / DC Orders ED Discharge Orders     None        Veryl Speak, MD 08/13/21 [phone]

## 2021-08-13 NOTE — ED Triage Notes (Signed)
Pt states SOB X 1 week usually resolves with a shower. Worse today at work ever worse tonight.  Also chest head and left ear pain

## 2021-08-13 NOTE — Discharge Instructions (Addendum)
Begin taking Lasix as prescribed.  Resume taking amlodipine as previously prescribed.  Follow-up with cardiology in the next week.  The contact information for the cardiology clinic on [facility] has been provided in this discharge summary for you to call and make these arrangements.  Return to the emergency department if you develop worsening breathing, severe chest pain, or other new and concerning symptoms.

## 2021-08-30 ENCOUNTER — Ambulatory Visit: Payer: Self-pay | Admitting: Cardiology

## 2021-09-23 ENCOUNTER — Encounter: Payer: Self-pay | Admitting: Cardiology

## 2021-09-23 ENCOUNTER — Other Ambulatory Visit: Payer: Self-pay

## 2021-09-23 ENCOUNTER — Ambulatory Visit (INDEPENDENT_AMBULATORY_CARE_PROVIDER_SITE_OTHER): Payer: PRIVATE HEALTH INSURANCE | Admitting: Cardiology

## 2021-09-23 VITALS — BP 203/119 | HR 90 | Ht 67.0 in | Wt 288.0 lb

## 2021-09-23 DIAGNOSIS — R0602 Shortness of breath: Secondary | ICD-10-CM

## 2021-09-23 DIAGNOSIS — E1165 Type 2 diabetes mellitus with hyperglycemia: Secondary | ICD-10-CM | POA: Diagnosis not present

## 2021-09-23 DIAGNOSIS — Z79899 Other long term (current) drug therapy: Secondary | ICD-10-CM

## 2021-09-23 MED ORDER — VALSARTAN 80 MG PO TABS: 80.0000 mg | ORAL_TABLET | Freq: Every day | ORAL | 3 refills | Status: DC

## 2021-09-23 NOTE — Progress Notes (Signed)
Cardio-Obstetrics Clinic  New Evaluation  Date:  09/23/2021   ID:  Carolyn Zimmerman, DOB [birth year], MRN [id number]  PCP:  Vevelyn Francois, NP   [facility] Providers Cardiologist:  Berniece Salines, DO  Electrophysiologist:  None       Referring MD: Vevelyn Francois, NP   Chief Complaint: Shortness of breath  History of Present Illness:    Carolyn Zimmerman is a 36 y.o. female [No obstetric history on file.] who is being seen today for the evaluation of shortness of breath at the request of Vevelyn Francois, NP.   Reports that since last summer she has had intermittent episodes of shortness of breath. This seemed to have worsened about 2 months ago. She presented to UC in November when she "couldn't breathe". She states that they did labs and imaging that was concerning for fluid around her heart, so she was started on Lasix and referred to Cardiology. She felt some relief on the Lasix initially, but states that since last Saturday she has noticed she is peeing less and it is not helping as much. She also notes intermittent chest tightness but denies chest pain- she feels this is reflux. She is unable to sleep flat on her back- she is typically propped up with multiple pillows. She does not check her BP at home but notes it was high at [facility] as well. She is currently taking 25 mg HCTZ, 40 mg Lasix and 10 mg of Amlodipine. She is not taking her Lisinopril at the moment. She admits to being bad about taking her medications daily. She also notes swelling in her hands and legs.   Prior CV Studies Reviewed: The following studies were reviewed today: CXR, BNP, Troponin, EKG from 11/26  Past Medical History:  Diagnosis Date   Abscess of chest wall 06/15/2017   Coronavirus infection 05/02/2020   Diabetes mellitus    Hypertension    Jaundice    Obesity    TOA (tubo-ovarian abscess) 09/2019    Past Surgical History:  Procedure Laterality Date   CHOLECYSTECTOMY     IRRIGATION AND DEBRIDEMENT ABSCESS  N/A 05/02/2020   Procedure: IRRIGATION AND DEBRIDEMENT ABDOMINAL and CHEST WALL NECROTIZING FASCITITS;  Surgeon: Michael Boston, MD;  Location: [facility];  Service: General;  Laterality: N/A;   liver stent        OB History   No obstetric history on file.         Current Medications: Current Meds  Medication Sig   acetaminophen (TYLENOL) 500 MG tablet Take 2 tablets (1,000 mg total) by mouth every 8 (eight) hours as needed for mild pain.   albuterol (VENTOLIN HFA) 108 (90 Base) MCG/ACT inhaler Inhale 2 puffs into the lungs every 4 (four) hours as needed for wheezing or shortness of breath.   amLODipine (NORVASC) 10 MG tablet Take 1 tablet (10 mg total) by mouth daily.   furosemide (LASIX) 40 MG tablet Take 1 tablet (40 mg total) by mouth daily.   hydrochlorothiazide (HYDRODIURIL) 25 MG tablet Take 1 tablet (25 mg total) by mouth daily.   metFORMIN (GLUCOPHAGE) 1000 MG tablet Take 1 tablet (1,000 mg total) by mouth 2 (two) times daily with a meal.   valsartan (DIOVAN) 80 MG tablet Take 1 tablet (80 mg total) by mouth daily.   [DISCONTINUED] lisinopril (ZESTRIL) 10 MG tablet Take 1 tablet (10 mg total) by mouth daily.     Allergies:   Contrast media [iodinated contrast media]   Social History   Socioeconomic  History   Marital status: Single    Spouse name: Not on file   Number of children: Not on file   Years of education: Not on file   Highest education level: Not on file  Occupational History   Not on file  Tobacco Use   Smoking status: Former    Packs/day: 0.50    Types: Cigarettes   Smokeless tobacco: Never  Vaping Use   Vaping Use: Former  Substance and Sexual Activity   Alcohol use: Not Currently    Comment: occ   Drug use: No   Sexual activity: Not on file  Other Topics Concern   Not on file  Social History Narrative   Not on file   Social Determinants of Health   Financial Resource Strain: Not on file  Food Insecurity: Not on file  Transportation Needs: Not  on file  Physical Activity: Not on file  Stress: Not on file  Social Connections: Not on file      Family History  Problem Relation Age of Onset   Hypertension Mother    Diabetes Father    Hypertension Father       ROS:   Please see the history of present illness.    +Chest tightness, orthopnea, hand swelling, leg swelling, SOB Negative for chest pain All other systems reviewed and are negative.   Labs/EKG Reviewed:    EKG:   EKG is not ordered today.   Recent Labs: 08/13/2021: ALT 19; B Natriuretic Peptide 340.7; BUN 14; Creatinine, Ser 0.74; Hemoglobin 11.5; Platelets 227; Potassium 3.9; Sodium 134   Recent Lipid Panel No results found for: CHOL, TRIG, HDL, CHOLHDL, LDLCALC, LDLDIRECT  Physical Exam:    VS:  BP (!) 203/119    Pulse 90    Ht 5\' 7"  (1.702 m)    Wt 288 lb (130.6 kg)    LMP 09/12/2021    SpO2 99%    BMI 45.11 kg/m     Wt Readings from Last 3 Encounters:  09/23/21 288 lb (130.6 kg)  08/13/21 291 lb 14.4 oz (132.4 kg)  04/27/21 290 lb (131.5 kg)     GEN:  Morbidly obese, well nourished, well developed in no acute distress HEENT: Hirsutism on chin  NECK: No JVD; No carotid bruits CARDIAC: RRR, no murmurs, rubs, gallops. 1+ edema b/l lower extremities to knee RESPIRATORY:  Clear to auscultation without rales, wheezing or rhonchi  ABDOMEN: Morbidly obese, soft, non-tender, non-distended MUSCULOSKELETAL:  No deformity, normal ROM  SKIN: Warm and dry NEUROLOGIC:  Alert and oriented x 3 PSYCHIATRIC:  Normal affect    Risk Assessment/Risk Calculators:                 ASSESSMENT & PLAN:    Uncontrolled HTN   SOB  Concern for CHF given long-standing history of uncontrolled HTN. BP 203/119 in office today. Long-discussion regarding long-term effects of uncontrolled HTN. Will need further evaluation for CHF. Reviewed lab work from [facility] visit in November which was significant for cardiomegaly with mild pulmonary vascular congestion on CXR and BNP 340.   -Echo -BMP, Mg -Stop Lisinopril, START Valsartan 20 mg  -Continue HCTZ, Amlodipine -INCREASE Lasix to 40 mg BID x 5 days and then daily after -Daily Potassium supplements 20 mEq daily -Can consider sleep study pending work-up -F/u in 2 weeks   Type 2 DM: Uncontrolled   ?PCOS Uncontrolled. Last A1c in November 2021. Long-discussion regarding consequences of uncontrolled T2DM including blindness and dialysis.  -Referral to Endocrinology  for management; may also have PCOS  -Needs ophthalmologic examination  Patient Instructions  Medication Instructions:  INCREASE Lasix to 40 mg 2 times a day for 5 days then on day 6 going forward take Lasix 40 mg daily START Potassium 20 meq daily START Valsartan 80 mg daily STOP Lisinopril  *If you need a refill on your cardiac medications before your next appointment, please call your pharmacy*  Lab Work: Your physician recommends that you return for lab work TODAY:  BMET Magnessium   If you have labs (blood work) drawn today and your tests are completely normal, you will receive your results only by: [employer] (if you have MyChart) OR A paper copy in the mail If you have any lab test that is abnormal or we need to change your treatment, we will call you to review the results.  Testing/Procedures: Your physician has requested that you have an echocardiogram. Echocardiography is a painless test that uses sound waves to create images of your heart. It provides your doctor with information about the size and shape of your heart and how well your hearts chambers and valves are working. This procedure takes approximately one hour. There are no restrictions for this procedure.  Follow-Up: At [facility], you and your health needs are our priority.  As part of our continuing mission to provide you with exceptional heart care, we have created designated Provider Care Teams.  These Care Teams include your primary Cardiologist (physician) and  Advanced Practice Providers (APPs -  Physician Assistants and Nurse Practitioners) who all work together to provide you with the care you need, when you need it.  Your next appointment:   2 week(s)  The format for your next appointment:   In Person  Provider:   Berniece Salines, DO [address], [place], [place] [address]  Other Instructions     Dispo:  Return in about 2 weeks (around 10/07/2021).   Medication Adjustments/Labs and Tests Ordered: Current medicines are reviewed at length with the patient today.  Concerns regarding medicines are outlined above.  Tests Ordered: Orders Placed This Encounter  Procedures   Basic metabolic panel   Magnesium   ECHOCARDIOGRAM COMPLETE   Medication Changes: Meds ordered this encounter  Medications   valsartan (DIOVAN) 80 MG tablet    Sig: Take 1 tablet (80 mg total) by mouth daily.    Dispense:  90 tablet    Refill:  3

## 2021-09-23 NOTE — Addendum Note (Signed)
Addended by: Dorris Fetch on: 09/23/2021 04:22 PM   Modules accepted: Orders

## 2021-09-23 NOTE — Patient Instructions (Addendum)
Medication Instructions:  INCREASE Lasix to 40 mg 2 times a day for 5 days then on day 6 going forward take Lasix 40 mg daily START Potassium 20 meq daily START Valsartan 80 mg daily STOP Lisinopril  *If you need a refill on your cardiac medications before your next appointment, please call your pharmacy*  Lab Work: Your physician recommends that you return for lab work TODAY:  BMET Magnessium   If you have labs (blood work) drawn today and your tests are completely normal, you will receive your results only by: [employer] (if you have MyChart) OR A paper copy in the mail If you have any lab test that is abnormal or we need to change your treatment, we will call you to review the results.  Testing/Procedures: Your physician has requested that you have an echocardiogram. Echocardiography is a painless test that uses sound waves to create images of your heart. It provides your doctor with information about the size and shape of your heart and how well your hearts chambers and valves are working. This procedure takes approximately one hour. There are no restrictions for this procedure.  Follow-Up: At [facility], you and your health needs are our priority.  As part of our continuing mission to provide you with exceptional heart care, we have created designated Provider Care Teams.  These Care Teams include your primary Cardiologist (physician) and Advanced Practice Providers (APPs -  Physician Assistants and Nurse Practitioners) who all work together to provide you with the care you need, when you need it.  Your next appointment:   2 week(s)  The format for your next appointment:   In Person  Provider:   Thomasene Ripple, DO [address], [place], [place] [address]  Other Instructions You have been referred to Endocrinology

## 2021-09-29 ENCOUNTER — Other Ambulatory Visit: Payer: Self-pay

## 2021-09-29 ENCOUNTER — Ambulatory Visit (INDEPENDENT_AMBULATORY_CARE_PROVIDER_SITE_OTHER): Payer: PRIVATE HEALTH INSURANCE

## 2021-09-29 DIAGNOSIS — R079 Chest pain, unspecified: Secondary | ICD-10-CM

## 2021-09-29 DIAGNOSIS — I5020 Unspecified systolic (congestive) heart failure: Secondary | ICD-10-CM

## 2021-09-29 DIAGNOSIS — R0602 Shortness of breath: Secondary | ICD-10-CM

## 2021-09-29 DIAGNOSIS — I5022 Chronic systolic (congestive) heart failure: Secondary | ICD-10-CM

## 2021-09-29 HISTORY — DX: Unspecified systolic (congestive) heart failure: I50.20

## 2021-09-29 HISTORY — DX: Chronic systolic (congestive) heart failure: I50.22

## 2021-09-29 LAB — ECHOCARDIOGRAM COMPLETE
AR max vel: 1.92 cm2
AV Area VTI: 2.15 cm2
AV Area mean vel: 2.18 cm2
AV Mean grad: 5 mmHg
AV Peak grad: 9.9 mmHg
Ao pk vel: 1.57 m/s
Area-P 1/2: 5.02 cm2
Calc EF: 59.3 %
S' Lateral: 5.5 cm
Single Plane A2C EF: 58.2 %
Single Plane A4C EF: 60.3 %

## 2021-09-29 MED ORDER — PERFLUTREN LIPID MICROSPHERE
1.0000 mL | INTRAVENOUS | Status: AC | PRN
  Administered 2021-09-29: 2 mL via INTRAVENOUS

## 2021-09-30 LAB — BASIC METABOLIC PANEL
BUN/Creatinine Ratio: 18 (ref 9–23)
BUN: 13 mg/dL (ref 6–20)
CO2: 25 mmol/L (ref 20–29)
Calcium: 9.3 mg/dL (ref 8.7–10.2)
Chloride: 99 mmol/L (ref 96–106)
Creatinine, Ser: 0.73 mg/dL (ref 0.57–1.00)
Glucose: 269 mg/dL — ABNORMAL HIGH (ref 70–99)
Potassium: 4.1 mmol/L (ref 3.5–5.2)
Sodium: 134 mmol/L (ref 134–144)
eGFR: 111 mL/min/{1.73_m2} (ref >59)

## 2021-09-30 LAB — MAGNESIUM: Magnesium: 1.8 mg/dL (ref 1.6–2.3)

## 2021-10-06 ENCOUNTER — Telehealth: Payer: Self-pay | Admitting: Cardiology

## 2021-10-06 ENCOUNTER — Other Ambulatory Visit: Payer: Self-pay

## 2021-10-06 ENCOUNTER — Ambulatory Visit (INDEPENDENT_AMBULATORY_CARE_PROVIDER_SITE_OTHER): Payer: PRIVATE HEALTH INSURANCE | Admitting: Cardiology

## 2021-10-06 VITALS — BP 166/120 | HR 84 | Ht 67.0 in | Wt 287.4 lb

## 2021-10-06 DIAGNOSIS — R0989 Other specified symptoms and signs involving the circulatory and respiratory systems: Secondary | ICD-10-CM | POA: Diagnosis not present

## 2021-10-06 DIAGNOSIS — I10 Essential (primary) hypertension: Secondary | ICD-10-CM | POA: Diagnosis not present

## 2021-10-06 DIAGNOSIS — Z794 Long term (current) use of insulin: Secondary | ICD-10-CM

## 2021-10-06 DIAGNOSIS — E119 Type 2 diabetes mellitus without complications: Secondary | ICD-10-CM

## 2021-10-06 DIAGNOSIS — I340 Nonrheumatic mitral (valve) insufficiency: Secondary | ICD-10-CM

## 2021-10-06 MED ORDER — SACUBITRIL-VALSARTAN 24-26 MG PO TABS
1.0000 | ORAL_TABLET | Freq: Two times a day (BID) | ORAL | 3 refills | Status: DC
Start: 2021-10-06 — End: 2021-11-25

## 2021-10-06 MED ORDER — CARVEDILOL 12.5 MG PO TABS: 12.5000 mg | ORAL_TABLET | Freq: Two times a day (BID) | ORAL | 3 refills | Status: DC

## 2021-10-06 NOTE — Patient Instructions (Signed)
Medication Instructions:   -Stop amlodipine (norvasc).  -Stop valsartan (diovan).  -Start carvedilol (coreg) 12.5mg  twice daily.  -Start Entresto 24-26mg  twice daily.   *If you need a refill on your cardiac medications before your next appointment, please call your pharmacy*   Lab Work: Your physician recommends that you have labs drawn today: BMET, Mag, A1C  If you have labs (blood work) drawn today and your tests are completely normal, you will receive your results only by: MyChart Message (if you have MyChart) OR A paper copy in the mail If you have any lab test that is abnormal or we need to change your treatment, we will call you to review the results.   Follow-Up: At [facility], you and your health needs are our priority.  As part of our continuing mission to provide you with exceptional heart care, we have created designated Provider Care Teams.  These Care Teams include your primary Cardiologist (physician) and Advanced Practice Providers (APPs -  Physician Assistants and Nurse Practitioners) who all work together to provide you with the care you need, when you need it.  We recommend signing up for the patient portal called "MyChart".  Sign up information is provided on this After Visit Summary.  MyChart is used to connect with patients for Virtual Visits (Telemedicine).  Patients are able to view lab/test results, encounter notes, upcoming appointments, etc.  Non-urgent messages can be sent to your provider as well.   To learn more about what you can do with MyChart, go to [email or web address].    Your next appointment:   6 week(s)  The format for your next appointment:   In Person  Provider:   Thomasene Ripple, DO    Other Instructions Pt needs an appointment with Thayer Ohm, PharmD to for diabetes education and weight management.

## 2021-10-06 NOTE — Telephone Encounter (Signed)
Spoke with pt regarding entresto. Called pharmacy where medication was sent who confirmed that pt does have a high deductible which is why they told her that the prescription would be so expensive. Will exhaust a few options before sending message over to Dr. Servando Salina for recommendations on changing medication.

## 2021-10-06 NOTE — Progress Notes (Signed)
Cardiology Office Note:    Date:  10/09/2021   ID:  Kenya Kook, DOB 1986/01/23, MRN [id number]  PCP:  Vevelyn Francois, NP  Cardiologist:  Berniece Salines, DO  Electrophysiologist:  None   Referring MD: Vevelyn Francois, NP   " I am nervous"  History of Present Illness:    Carolyn Zimmerman is a 36 y.o. female with a hx of of hypertension, diabetes mellitus who is here for a follow up visit.  I initially saw the patient on September 23, 2020 at that time she presented because she was experiencing shortness of breath.  During her visit she was significantly hypertensive.  We stopped her lisinopril and started her on valsartan 40 mg daily.  Continue hydrochlorothiazide and amlodipine.  I increased her Lasix because she did have bilateral leg edema to 40 mg twice daily that day for 5 days.  We also discussed her diabetes mellitus given the fact that she was significantly uncontrolled.  During that visit we will order echocardiogram.   I requested for the patient to be seen today as she had had her echocardiogram and she is here to discuss the result.  Past Medical History:  Diagnosis Date   Abscess of chest wall 06/15/2017   Coronavirus infection 05/02/2020   Diabetes mellitus    Hypertension    Jaundice    Obesity    TOA (tubo-ovarian abscess) 09/2019    Past Surgical History:  Procedure Laterality Date   CHOLECYSTECTOMY     IRRIGATION AND DEBRIDEMENT ABSCESS N/A 05/02/2020   Procedure: IRRIGATION AND DEBRIDEMENT ABDOMINAL and CHEST WALL NECROTIZING FASCITITS;  Surgeon: Michael Boston, MD;  Location: [facility];  Service: General;  Laterality: N/A;   liver stent      Current Medications: Current Meds  Medication Sig   acetaminophen (TYLENOL) 500 MG tablet Take 2 tablets (1,000 mg total) by mouth every 8 (eight) hours as needed for mild pain.   albuterol (VENTOLIN HFA) 108 (90 Base) MCG/ACT inhaler Inhale 2 puffs into the lungs every 4 (four) hours as needed for wheezing or shortness of breath.    B-D ULTRAFINE III SHORT PEN 31G X 8 MM MISC See admin instructions.   blood glucose meter kit and supplies Dispense based on patient and insurance preference. Use up to four times daily as directed. (FOR ICD-9 250.00, 250.01).   carvedilol (COREG) 12.5 MG tablet Take 1 tablet (12.5 mg total) by mouth 2 (two) times daily.   Continuous Blood Gluc Receiver (FREESTYLE LIBRE 2 READER) DEVI 1 application by Does not apply route every 14 (fourteen) days.   Continuous Blood Gluc Sensor (FREESTYLE LIBRE 2 SENSOR) MISC 1 application by Does not apply route every 14 (fourteen) days.   furosemide (LASIX) 40 MG tablet Take 1 tablet (40 mg total) by mouth daily.   glipiZIDE (GLUCOTROL) 10 MG tablet Take 1 tablet (10 mg total) by mouth 2 (two) times daily before a meal.   insulin aspart protamine- aspart (NOVOLOG MIX 70/30) (70-30) 100 UNIT/ML injection Inject 0.6 mLs (60 Units total) into the skin 2 (two) times daily with a meal.   Insulin Syringe-Needle U-100 (INSULIN SYRINGE .3CC/31GX5/16") 31G X 5/16" 0.3 ML MISC Use with insulin, twice daily   metFORMIN (GLUCOPHAGE) 1000 MG tablet Take 1 tablet (1,000 mg total) by mouth 2 (two) times daily with a meal.   sacubitril-valsartan (ENTRESTO) 24-26 MG Take 1 tablet by mouth 2 (two) times daily.   [DISCONTINUED] amLODipine (NORVASC) 10 MG tablet Take 1 tablet (10  mg total) by mouth daily.   [DISCONTINUED] valsartan (DIOVAN) 80 MG tablet Take 1 tablet (80 mg total) by mouth daily.     Allergies:   Contrast media [iodinated contrast media]   Social History   Socioeconomic History   Marital status: Single    Spouse name: Not on file   Number of children: Not on file   Years of education: Not on file   Highest education level: Not on file  Occupational History   Not on file  Tobacco Use   Smoking status: Former    Packs/day: 0.50    Types: Cigarettes   Smokeless tobacco: Never  Vaping Use   Vaping Use: Former  Substance and Sexual Activity   Alcohol  use: Not Currently    Comment: occ   Drug use: No   Sexual activity: Not on file  Other Topics Concern   Not on file  Social History Narrative   Not on file   Social Determinants of Health   Financial Resource Strain: Not on file  Food Insecurity: Not on file  Transportation Needs: Not on file  Physical Activity: Not on file  Stress: Not on file  Social Connections: Not on file     Family History: The patient's family history includes Diabetes in her father; Hypertension in her father and mother.  ROS:   Review of Systems  Constitution: Negative for decreased appetite, fever and weight gain.  HENT: Negative for congestion, ear discharge, hoarse voice and sore throat.   Eyes: Negative for discharge, redness, vision loss in right eye and visual halos.  Cardiovascular: Negative for chest pain, dyspnea on exertion, leg swelling, orthopnea and palpitations.  Respiratory: Negative for cough, hemoptysis, shortness of breath and snoring.   Endocrine: Negative for heat intolerance and polyphagia.  Hematologic/Lymphatic: Negative for bleeding problem. Does not bruise/bleed easily.  Skin: Negative for flushing, nail changes, rash and suspicious lesions.  Musculoskeletal: Negative for arthritis, joint pain, muscle cramps, myalgias, neck pain and stiffness.  Gastrointestinal: Negative for abdominal pain, bowel incontinence, diarrhea and excessive appetite.  Genitourinary: Negative for decreased libido, genital sores and incomplete emptying.  Neurological: Negative for brief paralysis, focal weakness, headaches and loss of balance.  Psychiatric/Behavioral: Negative for altered mental status, depression and suicidal ideas.  Allergic/Immunologic: Negative for HIV exposure and persistent infections.    EKGs/Labs/Other Studies Reviewed:    The following studies were reviewed today:   EKG: None today   echocardiogram done September 29, 2020 IMPRESSIONS     1. Left ventricular ejection  fraction by 3D volume is 45 %. The left  ventricle has moderately decreased function. The left ventricle  demonstrates global hypokinesis. There is moderate left ventricular  hypertrophy. Left ventricular diastolic parameters  are indeterminate. The average left ventricular global longitudinal strain  is -7.7 %. The global longitudinal strain is abnormal.   2. Right ventricular systolic function is normal. The right ventricular  size is normal.   3. Left atrial size was severely dilated.   4. Right atrial size was mildly dilated.   5. A small pericardial effusion is present. The pericardial effusion is  circumferential. There is no evidence of cardiac tamponade.   6. The mitral valve is normal in structure. Mild mitral valve  regurgitation. No evidence of mitral stenosis.   7. The aortic valve is normal in structure. Aortic valve regurgitation is  not visualized. No aortic stenosis is present.   8. The inferior vena cava is normal in size with greater than  50%  respiratory variability, suggesting right atrial pressure of 3 mmHg.   FINDINGS   Left Ventricle: Left ventricular ejection fraction by 3D volume is 45 %.  The left ventricle has moderately decreased function. The left ventricle  demonstrates global hypokinesis. Definity contrast agent was given IV to  delineate the left ventricular  endocardial borders. The average left ventricular global longitudinal  strain is -7.7 %. The global longitudinal strain is abnormal. The left  ventricular internal cavity size was normal in size. There is moderate  left ventricular hypertrophy. Left  ventricular diastolic parameters are indeterminate.   Right Ventricle: The right ventricular size is normal. No increase in  right ventricular wall thickness. Right ventricular systolic function is  normal.   Left Atrium: Left atrial size was severely dilated.   Right Atrium: Right atrial size was mildly dilated.   Pericardium: A small  pericardial effusion is present. The pericardial  effusion is circumferential. There is no evidence of cardiac tamponade.   Mitral Valve: The mitral valve is normal in structure. Mild mitral valve  regurgitation, with posteriorly-directed jet. No evidence of mitral valve  stenosis.   Tricuspid Valve: The tricuspid valve is normal in structure. Tricuspid  valve regurgitation is not demonstrated. No evidence of tricuspid  stenosis.   Aortic Valve: The aortic valve is normal in structure. Aortic valve  regurgitation is not visualized. No aortic stenosis is present. Aortic  valve mean gradient measures 5.0 mmHg. Aortic valve peak gradient measures  9.9 mmHg. Aortic valve area, by VTI  measures 2.15 cm.   Pulmonic Valve: The pulmonic valve was normal in structure. Pulmonic valve  regurgitation is trivial. No evidence of pulmonic stenosis.   Aorta: The aortic root is normal in size and structure.   Venous: The inferior vena cava is normal in size with greater than 50%  respiratory variability, suggesting right atrial pressure of 3 mmHg.   IAS/Shunts: No atrial level shunt detected by color flow Doppler.   Recent Labs: 08/13/2021: ALT 19; B Natriuretic Peptide 340.7; Hemoglobin 11.5; Platelets 227 10/06/2021: BUN 14; Creatinine, Ser 0.81; Magnesium 1.8; Potassium 4.3; Sodium 140  Recent Lipid Panel No results found for: CHOL, TRIG, HDL, CHOLHDL, VLDL, LDLCALC, LDLDIRECT  Physical Exam:    VS:  BP (!) 166/120    Pulse 84    Ht _0  (1.702 m)    Wt 287 lb 6.4 oz (130.4 kg)    LMP 09/12/2021    SpO2 100%    BMI 45.01 kg/m     Wt Readings from Last 3 Encounters:  10/06/21 287 lb 6.4 oz (130.4 kg)  09/23/21 288 lb (130.6 kg)  08/13/21 291 lb 14.4 oz (132.4 kg)     GEN: Well nourished, well developed in no acute distress HEENT: Normal NECK: No JVD; No carotid bruits LYMPHATICS: No lymphadenopathy CARDIAC: S1S2 noted,RRR, no murmurs, rubs, gallops RESPIRATORY:  Clear to  auscultation without rales, wheezing or rhonchi  ABDOMEN: Soft, non-tender, non-distended, +bowel sounds, no guarding. EXTREMITIES: No edema, No cyanosis, no clubbing MUSCULOSKELETAL:  No deformity  SKIN: Warm and dry NEUROLOGIC:  Alert and oriented x 3, non-focal PSYCHIATRIC:  Normal affect, good insight  ASSESSMENT:    1. Depressed left ventricular ejection fraction   2. Accelerated hypertension   3. Mild mitral regurgitation   4. Morbid obesity ([place])   5. Insulin-requiring or dependent type II diabetes mellitus ([place])    PLAN:     1.  Discussed with the patient in great detail about her  echocardiogram results which showed depressed ejection fraction 45%.  I suspect this is in the setting of a hypertensive heart disease.  She had a lot of questions which I was able to answer.  In terms of her medication we will going to stop her statin and started patient on Entresto 24-25 mg twice daily add carvedilol.  For now we will keep her on the Lasix and stop amlodipine.  Diabetes mellitus-this is being managed by his primary care doctor.  No adjustments for antidiabetic medications were made today.  The patient understands the need to lose weight with diet and exercise. We have discussed specific strategies for this.  The patient is in agreement with the above plan. The patient left the office in stable condition.  The patient will follow up in next visit sooner if needed.   Medication Adjustments/Labs and Tests Ordered: Current medicines are reviewed at length with the patient today.  Concerns regarding medicines are outlined above.  Orders Placed This Encounter  Procedures   Basic metabolic panel   Magnesium   Hemoglobin A1c   EKG 12-Lead   Meds ordered this encounter  Medications   sacubitril-valsartan (ENTRESTO) 24-26 MG    Sig: Take 1 tablet by mouth 2 (two) times daily.    Dispense:  180 tablet    Refill:  3   carvedilol (COREG) 12.5 MG tablet    Sig: Take 1 tablet (12.5 mg  total) by mouth 2 (two) times daily.    Dispense:  180 tablet    Refill:  3    Patient Instructions  Medication Instructions:   -Stop amlodipine (norvasc).  -Stop valsartan (diovan).  -Start carvedilol (coreg) 12.77m twice daily.  -Start Entresto 24-226mtwice daily.   *If you need a refill on your cardiac medications before your next appointment, please call your pharmacy*   Lab Work: Your physician recommends that you have labs drawn today: BMET, Mag, A1C  If you have labs (blood work) drawn today and your tests are completely normal, you will receive your results only by: MyBetancesif you have MyChart) OR A paper copy in the mail If you have any lab test that is abnormal or we need to change your treatment, we will call you to review the results.   Follow-Up: [facility] are our priority.  As part of our continuing mission to provide you with exceptional heart care, we have created designated Provider Care Teams.  These Care Teams include your primary Cardiologist (physician) and Advanced Practice Providers (APPs -  Physician Assistants and Nurse Practitioners) who all work together to provide you with the care you need, when you need it.  We recommend signing up for the patient portal called "MyChart".  Sign up information is provided on this After Visit Summary.  MyChart is used to connect with patients for Virtual Visits (Telemedicine).  Patients are able to view lab/test results, encounter notes, upcoming appointments, etc.  Non-urgent messages can be sent to your provider as well.   To learn more about what you can do with MyChart, go to [email or web address]   Your next appointment:   6 week(s)  The format for your next appointment:   In Person  Provider:   KaBerniece SalinesDO    Other Instructions Pt needs an appointment with ChGerald StabsPharmD to for diabetes education and weight management.     Adopting a Healthy  Lifestyle.  Know what a healthy weight is for you (roughly  BMI <25) and aim to maintain this   Aim for 7+ servings of fruits and vegetables daily   65-80+ fluid ounces of water or unsweet tea for healthy kidneys   Limit to max 1 drink of alcohol per day; avoid smoking/tobacco   Limit animal fats in diet for cholesterol and heart health - choose grass fed whenever available   Avoid highly processed foods, and foods high in saturated/trans fats   Aim for low stress - take time to unwind and care for your mental health   Aim for 150 min of moderate intensity exercise weekly for heart health, and weights twice weekly for bone health   Aim for 7-9 hours of sleep daily   When it comes to diets, agreement about the perfect plan isnt easy to find, even among the experts. Experts at the [employer] developed an idea known as the Healthy Eating Plate. Just imagine a plate divided into logical, healthy portions.   The emphasis is on diet quality:   Load up on vegetables and fruits - one-half of your plate: Aim for color and variety, and remember that potatoes dont count.   Go for whole grains - one-quarter of your plate: Whole wheat, barley, wheat berries, quinoa, oats, brown rice, and foods made with them. If you want pasta, go with whole wheat pasta.   Protein power - one-quarter of your plate: Fish, chicken, beans, and nuts are all healthy, versatile protein sources. Limit red meat.   The diet, however, does go beyond the plate, offering a few other suggestions.   Use healthy plant oils, such as olive, canola, soy, corn, sunflower and peanut. Check the labels, and avoid partially hydrogenated oil, which have unhealthy trans fats.   If youre thirsty, drink water. Coffee and tea are good in moderation, but skip sugary drinks and limit milk and dairy products to one or two daily servings.   The type of carbohydrate in the diet is more important than the amount. Some  sources of carbohydrates, such as vegetables, fruits, whole grains, and beans-are healthier than others.   Finally, stay active  Signed, Berniece Salines, DO  10/09/2021 7:02 PM     [facility]

## 2021-10-06 NOTE — Telephone Encounter (Signed)
Pt c/o medication issue:  1. Name of Medication:  sacubitril-valsartan (ENTRESTO) 24-26 MG  2. How are you currently taking this medication (dosage and times per day)?  Patient has not started  3. Are you having a reaction (difficulty breathing--STAT)?  No   4. What is your medication issue?   Patient states her insurance does not cover this medication and it will cost her about $1000. She would like to know if she can be put on an alternative. Please advise.

## 2021-10-07 ENCOUNTER — Other Ambulatory Visit: Payer: Self-pay

## 2021-10-07 LAB — HEMOGLOBIN A1C
Est. average glucose Bld gHb Est-mCnc: 240 mg/dL
Hgb A1c MFr Bld: 10 % — ABNORMAL HIGH (ref 4.8–5.6)

## 2021-10-07 LAB — BASIC METABOLIC PANEL
BUN/Creatinine Ratio: 17 (ref 9–23)
BUN: 14 mg/dL (ref 6–20)
CO2: 24 mmol/L (ref 20–29)
Calcium: 9.2 mg/dL (ref 8.7–10.2)
Chloride: 103 mmol/L (ref 96–106)
Creatinine, Ser: 0.81 mg/dL (ref 0.57–1.00)
Glucose: 229 mg/dL — ABNORMAL HIGH (ref 70–99)
Potassium: 4.3 mmol/L (ref 3.5–5.2)
Sodium: 140 mmol/L (ref 134–144)
eGFR: 98 mL/min/{1.73_m2} (ref >59)

## 2021-10-07 LAB — MAGNESIUM: Magnesium: 1.8 mg/dL (ref 1.6–2.3)

## 2021-10-07 MED ORDER — VALSARTAN 160 MG PO TABS: 160.0000 mg | ORAL_TABLET | Freq: Every day | ORAL | 1 refills | Status: DC

## 2021-10-07 NOTE — Telephone Encounter (Unsigned)
Spoke with pt regarding entresto. [facility] with pt assistance for medication. Explained to pt that our next step is to send in an application for pt assistance through [employer]. Will mail pt application and she will return her portion with proof of income. Pt verbalizes understanding.

## 2021-10-07 NOTE — Telephone Encounter (Unsigned)
Per Conversation with Dr. Servando Salina to let her know the situation, she would like to increase pt's valsartan from 80mg  to 160mg  while we continue to work on situation. Called pt and communicated the plan. Pt verbalizes understanding. New prescription sent to pt's pharmacy of choice.

## 2021-10-07 NOTE — Telephone Encounter (Signed)
Pharmacy called back.  Reports they can not do patient assistance for this patient.  Recommended patient try patient assistance through manufacturer.  Pharmacy #: [phone]

## 2021-11-03 ENCOUNTER — Ambulatory Visit: Payer: PRIVATE HEALTH INSURANCE

## 2021-11-03 NOTE — Progress Notes (Deleted)
HPI: Carolyn Zimmerman is a 36 y.o. female patient referred to pharmacy clinic by Dr. Harriet Masson to initiate weight loss therapy with GLP1-RA.  Most recent BMI 45.  Her medical history is significant for DM2, hypertension and obesity.  Her most recent A1c (1/23) was 10.0.       *** If diabetic and on insulin/sulfonylurea, can consider reducing dose to reduce risk of hypoglycemia  *** Follow-up visit  Assess % weight loss Assess adverse effects Missed doses  Current weight management medications:   Previously tried meds:   Current meds that may affect weight: insulin  Baseline weight/BMI:   Insurance payor: [employer] - Rx discount not insurance  Diet:  -Breakfast: -Lunch: -Dinner: -Snacks: -Drinks:  Exercise:   Family History:   Confirmed patient not ***pregnant and no personal or family history of medullary thyroid carcinoma (MTC) or Multiple Endocrine Neoplasia syndrome type 2 (MEN 2).   Social History:   Labs: Lab Results  Component Value Date   HGBA1C 10.0 (H) 10/06/2021    Wt Readings from Last 1 Encounters:  10/06/21 287 lb 6.4 oz (130.4 kg)    BP Readings from Last 1 Encounters:  10/06/21 (!) 166/120   Pulse Readings from Last 1 Encounters:  10/06/21 84    No results found for: CHOL, TRIG, HDL, CHOLHDL, VLDL, LDLCALC, LDLDIRECT  Past Medical History:  Diagnosis Date   Abscess of chest wall 06/15/2017   Coronavirus infection 05/02/2020   Diabetes mellitus    Hypertension    Jaundice    Obesity    TOA (tubo-ovarian abscess) 09/2019    Current Outpatient Medications on File Prior to Visit  Medication Sig Dispense Refill   acetaminophen (TYLENOL) 500 MG tablet Take 2 tablets (1,000 mg total) by mouth every 8 (eight) hours as needed for mild pain. 60 tablet 3   albuterol (VENTOLIN HFA) 108 (90 Base) MCG/ACT inhaler Inhale 2 puffs into the lungs every 4 (four) hours as needed for wheezing or shortness of breath. 18 each 11   B-D ULTRAFINE III SHORT PEN 31G  X 8 MM MISC See admin instructions.     blood glucose meter kit and supplies Dispense based on patient and insurance preference. Use up to four times daily as directed. (FOR ICD-9 250.00, 250.01). 1 each 0   carvedilol (COREG) 12.5 MG tablet Take 1 tablet (12.5 mg total) by mouth 2 (two) times daily. 180 tablet 3   Continuous Blood Gluc Receiver (FREESTYLE LIBRE 2 READER) DEVI 1 application by Does not apply route every 14 (fourteen) days. 1 each 11   Continuous Blood Gluc Sensor (FREESTYLE LIBRE 2 SENSOR) MISC 1 application by Does not apply route every 14 (fourteen) days. 2 each 11   furosemide (LASIX) 40 MG tablet Take 1 tablet (40 mg total) by mouth daily. 30 tablet 1   glipiZIDE (GLUCOTROL) 10 MG tablet Take 1 tablet (10 mg total) by mouth 2 (two) times daily before a meal. 180 tablet 3   insulin aspart protamine- aspart (NOVOLOG MIX 70/30) (70-30) 100 UNIT/ML injection Inject 0.6 mLs (60 Units total) into the skin 2 (two) times daily with a meal. 36 mL 11   Insulin Syringe-Needle U-100 (INSULIN SYRINGE .3CC/31GX5/16") 31G X 5/16" 0.3 ML MISC Use with insulin, twice daily 100 each 3   metFORMIN (GLUCOPHAGE) 1000 MG tablet Take 1 tablet (1,000 mg total) by mouth 2 (two) times daily with a meal. 180 tablet 3   sacubitril-valsartan (ENTRESTO) 24-26 MG Take 1 tablet by mouth 2 (two)  times daily. 180 tablet 3   valsartan (DIOVAN) 160 MG tablet Take 1 tablet (160 mg total) by mouth daily. 90 tablet 1   No current facility-administered medications on file prior to visit.    Allergies  Allergen Reactions   Contrast Media [Iodinated Contrast Media] Hives and Swelling    No problem-specific Assessment & Plan notes found for this encounter.   Tommy Medal PharmD CPP [place]

## 2021-11-04 ENCOUNTER — Telehealth: Payer: Self-pay

## 2021-11-04 ENCOUNTER — Other Ambulatory Visit: Payer: Self-pay

## 2021-11-04 MED ORDER — VALSARTAN 320 MG PO TABS: 320.0000 mg | ORAL_TABLET | Freq: Every day | ORAL | 3 refills | Status: DC

## 2021-11-04 MED ORDER — FUROSEMIDE 40 MG PO TABS: 40.0000 mg | ORAL_TABLET | Freq: Every day | ORAL | 3 refills | Status: DC

## 2021-11-04 NOTE — Telephone Encounter (Signed)
Called pt per Dr. Mallory Shirk request. Valsartan increased to 320 mg daily. Pt advised to take her blood pressure daily for 1 week than send in a MyChart message. Pt verbalized understanding.

## 2021-11-24 ENCOUNTER — Ambulatory Visit: Payer: PRIVATE HEALTH INSURANCE | Admitting: Cardiology

## 2021-11-25 ENCOUNTER — Other Ambulatory Visit: Payer: Self-pay

## 2021-11-25 ENCOUNTER — Ambulatory Visit (INDEPENDENT_AMBULATORY_CARE_PROVIDER_SITE_OTHER): Payer: PRIVATE HEALTH INSURANCE | Admitting: Cardiology

## 2021-11-25 ENCOUNTER — Encounter: Payer: Self-pay | Admitting: Cardiology

## 2021-11-25 VITALS — BP 201/118 | HR 83 | Ht 67.0 in | Wt 278.2 lb

## 2021-11-25 DIAGNOSIS — Z79899 Other long term (current) drug therapy: Secondary | ICD-10-CM

## 2021-11-25 DIAGNOSIS — I10 Essential (primary) hypertension: Secondary | ICD-10-CM

## 2021-11-25 DIAGNOSIS — R0989 Other specified symptoms and signs involving the circulatory and respiratory systems: Secondary | ICD-10-CM

## 2021-11-25 DIAGNOSIS — F419 Anxiety disorder, unspecified: Secondary | ICD-10-CM

## 2021-11-25 LAB — BASIC METABOLIC PANEL
BUN/Creatinine Ratio: 14 (ref 9–23)
BUN: 10 mg/dL (ref 6–20)
CO2: 24 mmol/L (ref 20–29)
Calcium: 9.2 mg/dL (ref 8.7–10.2)
Chloride: 103 mmol/L (ref 96–106)
Creatinine, Ser: 0.73 mg/dL (ref 0.57–1.00)
Glucose: 205 mg/dL — ABNORMAL HIGH (ref 70–99)
Potassium: 4.2 mmol/L (ref 3.5–5.2)
Sodium: 138 mmol/L (ref 134–144)
eGFR: 111 mL/min/{1.73_m2} (ref >59)

## 2021-11-25 LAB — MAGNESIUM: Magnesium: 1.9 mg/dL (ref 1.6–2.3)

## 2021-11-25 MED ORDER — FUROSEMIDE 40 MG PO TABS: 40.0000 mg | ORAL_TABLET | Freq: Every day | ORAL | 3 refills | Status: DC

## 2021-11-25 MED ORDER — SPIRONOLACTONE 25 MG PO TABS: 25.0000 mg | ORAL_TABLET | Freq: Every day | ORAL | 3 refills | Status: DC

## 2021-11-25 MED ORDER — CARVEDILOL 25 MG PO TABS: 25.0000 mg | ORAL_TABLET | Freq: Two times a day (BID) | ORAL | 3 refills | Status: DC

## 2021-11-25 NOTE — Patient Instructions (Signed)
Medication Instructions:  ?Your physician has recommended you make the following change in your medication:  ?INCREASE: Coreg 25 mg twice daily ?START: Aldactone 25 once daily  ?*If you need a refill on your cardiac medications before your next appointment, please call your pharmacy* ? ? ?Lab Work: ?Your physician recommends that you return for lab work in:  ?TODAY: BMET, Mag ?If you have labs (blood work) drawn today and your tests are completely normal, you will receive your results only by: ?MyChart Message (if you have MyChart) OR ?A paper copy in the mail ?If you have any lab test that is abnormal or we need to change your treatment, we will call you to review the results. ? ? ?Testing/Procedures: ?None ? ? ?Follow-Up: ?At [facility], you and your health needs are our priority.  As part of our continuing mission to provide you with exceptional heart care, we have created designated Provider Care Teams.  These Care Teams include your primary Cardiologist (physician) and Advanced Practice Providers (APPs -  Physician Assistants and Nurse Practitioners) who all work together to provide you with the care you need, when you need it. ? ?We recommend signing up for the patient portal called "MyChart".  Sign up information is provided on this After Visit Summary.  MyChart is used to connect with patients for Virtual Visits (Telemedicine).  Patients are able to view lab/test results, encounter notes, upcoming appointments, etc.  Non-urgent messages can be sent to your provider as well.   ?To learn more about what you can do with MyChart, go to [email or web address].   ? ?Your next appointment:   ?1 week(s) ? ?The format for your next appointment:   ?In Person ? ?Provider:   ?Thomasene Ripple, DO   ? ? ?Other Instructions ?  ?

## 2021-11-26 NOTE — Progress Notes (Signed)
Cardiology Office Note:    Date:  11/26/2021   ID:  Carolyn Zimmerman, DOB Mar 09, 1986, MRN [id number]  PCP:  Vevelyn Francois, NP  Cardiologist:  Berniece Salines, DO  Electrophysiologist:  None   Referring MD: Vevelyn Francois, NP   " I am under a lot of stress"   History of Present Illness:    Carolyn Zimmerman is a 36 y.o. female with a hx of hypertension, diabetes mellitus, here today for follow-up visit.  I initially saw the patient on September 23, 2020 at that time she presented because she was experiencing shortness of breath.  During her visit she was significantly hypertensive.  We stopped her lisinopril and started her on valsartan 40 mg daily.  Continue hydrochlorothiazide and amlodipine.  I increased her Lasix because she did have bilateral leg edema to 40 mg twice daily that day for 5 days.  We also discussed her diabetes mellitus given the fact that she was significantly uncontrolled.   At her last visit on October 06, 2021 we discussed in detail that her new diagnosis with the EF of 45%.  I attempted to put the patient on Entresto 24 to 25 mg twice a day but unfortunately after appealing this medication he still was not covered by the insurance company.  We will therefore transition her back to valsartan, and added carvedilol.  The patient on the Lasix and held her amlodipine due to the medication changes.  Since I saw her with increase her valsartan up to 320 mg today, she is on carvedilol 12.5 mg twice daily.  Lasix 40 mg daily.  Unfortunately she had not been taking her Lasix.  Past Medical History:  Diagnosis Date   Abscess of chest wall 06/15/2017   Coronavirus infection 05/02/2020   Diabetes mellitus    Hypertension    Jaundice    Obesity    TOA (tubo-ovarian abscess) 09/2019    Past Surgical History:  Procedure Laterality Date   CHOLECYSTECTOMY     IRRIGATION AND DEBRIDEMENT ABSCESS N/A 05/02/2020   Procedure: IRRIGATION AND DEBRIDEMENT ABDOMINAL and CHEST WALL NECROTIZING  FASCITITS;  Surgeon: Michael Boston, MD;  Location: [facility];  Service: General;  Laterality: N/A;   liver stent      Current Medications: Current Meds  Medication Sig   acetaminophen (TYLENOL) 500 MG tablet Take 2 tablets (1,000 mg total) by mouth every 8 (eight) hours as needed for mild pain.   albuterol (VENTOLIN HFA) 108 (90 Base) MCG/ACT inhaler Inhale 2 puffs into the lungs every 4 (four) hours as needed for wheezing or shortness of breath.   B-D ULTRAFINE III SHORT PEN 31G X 8 MM MISC See admin instructions.   blood glucose meter kit and supplies Dispense based on patient and insurance preference. Use up to four times daily as directed. (FOR ICD-9 250.00, 250.01).   carvedilol (COREG) 25 MG tablet Take 1 tablet (25 mg total) by mouth 2 (two) times daily.   insulin aspart protamine- aspart (NOVOLOG MIX 70/30) (70-30) 100 UNIT/ML injection Inject 0.6 mLs (60 Units total) into the skin 2 (two) times daily with a meal.   Insulin Syringe-Needle U-100 (INSULIN SYRINGE .3CC/31GX5/16") 31G X 5/16" 0.3 ML MISC Use with insulin, twice daily   metFORMIN (GLUCOPHAGE) 1000 MG tablet Take 1 tablet (1,000 mg total) by mouth 2 (two) times daily with a meal.   spironolactone (ALDACTONE) 25 MG tablet Take 1 tablet (25 mg total) by mouth daily.   valsartan (DIOVAN) 320 MG tablet Take  1 tablet (320 mg total) by mouth daily.   [DISCONTINUED] carvedilol (COREG) 12.5 MG tablet Take 1 tablet (12.5 mg total) by mouth 2 (two) times daily.     Allergies:   Contrast media [iodinated contrast media]   Social History   Socioeconomic History   Marital status: Single    Spouse name: Not on file   Number of children: Not on file   Years of education: Not on file   Highest education level: Not on file  Occupational History   Not on file  Tobacco Use   Smoking status: Former    Packs/day: 0.50    Types: Cigarettes   Smokeless tobacco: Never  Vaping Use   Vaping Use: Former  Substance and Sexual Activity    Alcohol use: Not Currently    Comment: occ   Drug use: No   Sexual activity: Not on file  Other Topics Concern   Not on file  Social History Narrative   Not on file   Social Determinants of Health   Financial Resource Strain: Not on file  Food Insecurity: Not on file  Transportation Needs: Not on file  Physical Activity: Not on file  Stress: Not on file  Social Connections: Not on file     Family History: The patient's family history includes Diabetes in her father; Hypertension in her father and mother.  ROS:   Review of Systems  Constitution: Negative for decreased appetite, fever and weight gain.  HENT: Negative for congestion, ear discharge, hoarse voice and sore throat.   Eyes: Negative for discharge, redness, vision loss in right eye and visual halos.  Cardiovascular: Negative for chest pain, dyspnea on exertion, leg swelling, orthopnea and palpitations.  Respiratory: Negative for cough, hemoptysis, shortness of breath and snoring.   Endocrine: Negative for heat intolerance and polyphagia.  Hematologic/Lymphatic: Negative for bleeding problem. Does not bruise/bleed easily.  Skin: Negative for flushing, nail changes, rash and suspicious lesions.  Musculoskeletal: Negative for arthritis, joint pain, muscle cramps, myalgias, neck pain and stiffness.  Gastrointestinal: Negative for abdominal pain, bowel incontinence, diarrhea and excessive appetite.  Genitourinary: Negative for decreased libido, genital sores and incomplete emptying.  Neurological: Negative for brief paralysis, focal weakness, headaches and loss of balance.  Psychiatric/Behavioral: Negative for altered mental status, depression and suicidal ideas.  Allergic/Immunologic: Negative for HIV exposure and persistent infections.    EKGs/Labs/Other Studies Reviewed:    The following studies were reviewed today:   EKG: None today  echocardiogram done September 29, 2020 IMPRESSIONS     1. Left ventricular  ejection fraction by 3D volume is 45 %. The left  ventricle has moderately decreased function. The left ventricle  demonstrates global hypokinesis. There is moderate left ventricular  hypertrophy. Left ventricular diastolic parameters  are indeterminate. The average left ventricular global longitudinal strain  is -7.7 %. The global longitudinal strain is abnormal.   2. Right ventricular systolic function is normal. The right ventricular  size is normal.   3. Left atrial size was severely dilated.   4. Right atrial size was mildly dilated.   5. A small pericardial effusion is present. The pericardial effusion is  circumferential. There is no evidence of cardiac tamponade.   6. The mitral valve is normal in structure. Mild mitral valve  regurgitation. No evidence of mitral stenosis.   7. The aortic valve is normal in structure. Aortic valve regurgitation is  not visualized. No aortic stenosis is present.   8. The inferior vena cava is normal  in size with greater than 50%  respiratory variability, suggesting right atrial pressure of 3 mmHg.   FINDINGS   Left Ventricle: Left ventricular ejection fraction by 3D volume is 45 %.  The left ventricle has moderately decreased function. The left ventricle  demonstrates global hypokinesis. Definity contrast agent was given IV to  delineate the left ventricular  endocardial borders. The average left ventricular global longitudinal  strain is -7.7 %. The global longitudinal strain is abnormal. The left  ventricular internal cavity size was normal in size. There is moderate  left ventricular hypertrophy. Left  ventricular diastolic parameters are indeterminate.   Right Ventricle: The right ventricular size is normal. No increase in  right ventricular wall thickness. Right ventricular systolic function is  normal.   Left Atrium: Left atrial size was severely dilated.   Right Atrium: Right atrial size was mildly dilated.   Pericardium: A small  pericardial effusion is present. The pericardial  effusion is circumferential. There is no evidence of cardiac tamponade.   Mitral Valve: The mitral valve is normal in structure. Mild mitral valve  regurgitation, with posteriorly-directed jet. No evidence of mitral valve  stenosis.   Tricuspid Valve: The tricuspid valve is normal in structure. Tricuspid  valve regurgitation is not demonstrated. No evidence of tricuspid  stenosis.   Aortic Valve: The aortic valve is normal in structure. Aortic valve  regurgitation is not visualized. No aortic stenosis is present. Aortic  valve mean gradient measures 5.0 mmHg. Aortic valve peak gradient measures  9.9 mmHg. Aortic valve area, by VTI  measures 2.15 cm.   Pulmonic Valve: The pulmonic valve was normal in structure. Pulmonic valve  regurgitation is trivial. No evidence of pulmonic stenosis.   Aorta: The aortic root is normal in size and structure.   Venous: The inferior vena cava is normal in size with greater than 50%  respiratory variability, suggesting right atrial pressure of 3 mmHg.   IAS/Shunts: No atrial level shunt detected by color flow Doppler.   Recent Labs: 08/13/2021: ALT 19; B Natriuretic Peptide 340.7; Hemoglobin 11.5; Platelets 227 11/25/2021: BUN 10; Creatinine, Ser 0.73; Magnesium 1.9; Potassium 4.2; Sodium 138  Recent Lipid Panel No results found for: CHOL, TRIG, HDL, CHOLHDL, VLDL, LDLCALC, LDLDIRECT  Physical Exam:    VS:  BP (!) 201/118    Pulse 83    Ht $R'5\' 7"'Ba$  (1.702 m)    Wt 278 lb 3.2 oz (126.2 kg)    LMP 11/22/2021    SpO2 99%    BMI 43.57 kg/m     Wt Readings from Last 3 Encounters:  11/25/21 278 lb 3.2 oz (126.2 kg)  10/06/21 287 lb 6.4 oz (130.4 kg)  09/23/21 288 lb (130.6 kg)     GEN: Well nourished, well developed in no acute distress HEENT: Normal NECK: No JVD; No carotid bruits LYMPHATICS: No lymphadenopathy CARDIAC: S1S2 noted,RRR, no murmurs, rubs, gallops RESPIRATORY:  Clear to auscultation  without rales, wheezing or rhonchi  ABDOMEN: Soft, non-tender, non-distended, +bowel sounds, no guarding. EXTREMITIES: No edema, No cyanosis, no clubbing MUSCULOSKELETAL:  No deformity  SKIN: Warm and dry NEUROLOGIC:  Alert and oriented x 3, non-focal PSYCHIATRIC:  Normal affect, good insight  ASSESSMENT:    1. Accelerated hypertension   2. Hypertension, unspecified type   3. Medication management   4. Anxiety   5. Depressed left ventricular ejection fraction   6. Morbid obesity ([place])    PLAN:    Blood pressure is significantly elevated today, she tells me  she is under a lot of stress with family matters she is actively tearing in the office today.  I really would like the patient to go to the emergency department for IV medication to help her significantly elevated blood pressure.  At this time she is declining.  Therefore I have no choice so I will treat the patient with oral medications even though this is not ideal.   I will increase her carvedilol to 25 mg twice daily, start Aldactone 25 mg daily. I will bring the patient back in 1 week.  At that time I plan to add amlodipine. Also will refer the patient to behavioral specialist as she will benefit as there is factor of depression anxiety here. Plan to get a renal ultrasound in this patient as well. Diabetes mellitus-this is being managed by his primary care doctor.  No adjustments for antidiabetic medications were made today.  The patient understands the need to lose weight with diet and exercise. We have discussed specific strategies for this.   The patient is in agreement with the above plan. The patient left the office in stable condition.  The patient will follow up in 1 week   Medication Adjustments/Labs and Tests Ordered: Current medicines are reviewed at length with the patient today.  Concerns regarding medicines are outlined above.  Orders Placed This Encounter  Procedures   Basic Metabolic Panel (BMET)   Magnesium    Ambulatory referral to [facility] ordered this encounter  Medications   carvedilol (COREG) 25 MG tablet    Sig: Take 1 tablet (25 mg total) by mouth 2 (two) times daily.    Dispense:  180 tablet    Refill:  3   spironolactone (ALDACTONE) 25 MG tablet    Sig: Take 1 tablet (25 mg total) by mouth daily.    Dispense:  90 tablet    Refill:  3   DISCONTD: furosemide (LASIX) 40 MG tablet    Sig: Take 1 tablet (40 mg total) by mouth daily.    Dispense:  90 tablet    Refill:  3   furosemide (LASIX) 40 MG tablet    Sig: Take 1 tablet (40 mg total) by mouth daily.    Dispense:  90 tablet    Refill:  3    Patient Instructions  Medication Instructions:  Your physician has recommended you make the following change in your medication:  INCREASE: Coreg 25 mg twice daily START: Aldactone 25 once daily  *If you need a refill on your cardiac medications before your next appointment, please call your pharmacy*   Lab Work: Your physician recommends that you return for lab work in:  TODAY: BMET, [place] If you have labs (blood work) drawn today and your tests are completely normal, you will receive your results only by: [place] (if you have [place]) OR A paper copy in the mail If you have any lab test that is abnormal or we need to change your treatment, we will call you to review the results.   Testing/Procedures: None   Follow-Up: At [facility], you and your health needs are our priority.  As part of our continuing mission to provide you with exceptional heart care, we have created designated Provider Care Teams.  These Care Teams include your primary Cardiologist (physician) and Advanced Practice Providers (APPs -  Physician Assistants and Nurse Practitioners) who all work together to provide you with the care you need, when you need it.  We recommend  signing up for the patient portal called "MyChart".  Sign up information is provided on this After Visit Summary.   MyChart is used to connect with patients for Virtual Visits (Telemedicine).  Patients are able to view lab/test results, encounter notes, upcoming appointments, etc.  Non-urgent messages can be sent to your provider as well.   To learn more about what you can do with MyChart, go to [email or web address].    Your next appointment:   1 week(s)  The format for your next appointment:   In Person  Provider:   Berniece Salines, DO     Other Instructions     Adopting a Healthy Lifestyle.  Know what a healthy weight is for you (roughly BMI <25) and aim to maintain this   Aim for 7+ servings of fruits and vegetables daily   65-80+ fluid ounces of water or unsweet tea for healthy kidneys   Limit to max 1 drink of alcohol per day; avoid smoking/tobacco   Limit animal fats in diet for cholesterol and heart health - choose grass fed whenever available   Avoid highly processed foods, and foods high in saturated/trans fats   Aim for low stress - take time to unwind and care for your mental health   Aim for 150 min of moderate intensity exercise weekly for heart health, and weights twice weekly for bone health   Aim for 7-9 hours of sleep daily   When it comes to diets, agreement about the perfect plan isnt easy to find, even among the experts. Experts at the [employer] developed an idea known as the Healthy Eating Plate. Just imagine a plate divided into logical, healthy portions.   The emphasis is on diet quality:   Load up on vegetables and fruits - one-half of your plate: Aim for color and variety, and remember that potatoes dont count.   Go for whole grains - one-quarter of your plate: Whole wheat, barley, wheat berries, quinoa, oats, brown rice, and foods made with them. If you want pasta, go with whole wheat pasta.   Protein power - one-quarter of your plate: Fish, chicken, beans, and nuts are all healthy, versatile protein sources. Limit red meat.   The  diet, however, does go beyond the plate, offering a few other suggestions.   Use healthy plant oils, such as olive, canola, soy, corn, sunflower and peanut. Check the labels, and avoid partially hydrogenated oil, which have unhealthy trans fats.   If youre thirsty, drink water. Coffee and tea are good in moderation, but skip sugary drinks and limit milk and dairy products to one or two daily servings.   The type of carbohydrate in the diet is more important than the amount. Some sources of carbohydrates, such as vegetables, fruits, whole grains, and beans-are healthier than others.   Finally, stay active  Signed, Berniece Salines, DO  11/26/2021 2:50 PM    [facility]

## 2021-12-01 ENCOUNTER — Telehealth: Payer: Self-pay | Admitting: *Deleted

## 2021-12-01 ENCOUNTER — Emergency Department (HOSPITAL_BASED_OUTPATIENT_CLINIC_OR_DEPARTMENT_OTHER)
Admission: EM | Admit: 2021-12-01 | Discharge: 2021-12-01 | Disposition: A | Discharge status: Home / Self Care | Payer: PRIVATE HEALTH INSURANCE | Attending: Emergency Medicine | Admitting: Emergency Medicine

## 2021-12-01 ENCOUNTER — Other Ambulatory Visit: Payer: Self-pay

## 2021-12-01 ENCOUNTER — Encounter: Payer: Self-pay | Admitting: Cardiology

## 2021-12-01 ENCOUNTER — Emergency Department (HOSPITAL_BASED_OUTPATIENT_CLINIC_OR_DEPARTMENT_OTHER): Payer: PRIVATE HEALTH INSURANCE | Admitting: Radiology

## 2021-12-01 ENCOUNTER — Emergency Department (HOSPITAL_BASED_OUTPATIENT_CLINIC_OR_DEPARTMENT_OTHER): Payer: PRIVATE HEALTH INSURANCE

## 2021-12-01 ENCOUNTER — Ambulatory Visit (INDEPENDENT_AMBULATORY_CARE_PROVIDER_SITE_OTHER): Payer: PRIVATE HEALTH INSURANCE | Admitting: Cardiology

## 2021-12-01 VITALS — BP 208/118 | HR 58 | Ht 67.0 in | Wt 281.2 lb

## 2021-12-01 DIAGNOSIS — Z7984 Long term (current) use of oral hypoglycemic drugs: Secondary | ICD-10-CM | POA: Diagnosis not present

## 2021-12-01 DIAGNOSIS — Z79899 Other long term (current) drug therapy: Secondary | ICD-10-CM | POA: Insufficient documentation

## 2021-12-01 DIAGNOSIS — I10 Essential (primary) hypertension: Secondary | ICD-10-CM | POA: Insufficient documentation

## 2021-12-01 DIAGNOSIS — E1165 Type 2 diabetes mellitus with hyperglycemia: Secondary | ICD-10-CM | POA: Insufficient documentation

## 2021-12-01 DIAGNOSIS — Z794 Long term (current) use of insulin: Secondary | ICD-10-CM | POA: Insufficient documentation

## 2021-12-01 DIAGNOSIS — R519 Headache, unspecified: Secondary | ICD-10-CM | POA: Diagnosis not present

## 2021-12-01 DIAGNOSIS — R0602 Shortness of breath: Secondary | ICD-10-CM | POA: Insufficient documentation

## 2021-12-01 DIAGNOSIS — R030 Elevated blood-pressure reading, without diagnosis of hypertension: Secondary | ICD-10-CM | POA: Diagnosis present

## 2021-12-01 DIAGNOSIS — R0989 Other specified symptoms and signs involving the circulatory and respiratory systems: Secondary | ICD-10-CM | POA: Diagnosis not present

## 2021-12-01 LAB — BASIC METABOLIC PANEL
Anion gap: 6 (ref 5–15)
BUN: 13 mg/dL (ref 6–20)
CO2: 28 mmol/L (ref 22–32)
Calcium: 9.2 mg/dL (ref 8.9–10.3)
Chloride: 102 mmol/L (ref 98–111)
Creatinine, Ser: 0.85 mg/dL (ref 0.44–1.00)
GFR, Estimated: >60 mL/min (ref >60)
Glucose, Bld: 303 mg/dL — ABNORMAL HIGH (ref 70–99)
Potassium: 3.9 mmol/L (ref 3.5–5.1)
Sodium: 136 mmol/L (ref 135–145)

## 2021-12-01 LAB — CBC WITH DIFFERENTIAL/PLATELET
Abs Immature Granulocytes: 0.02 10*3/uL (ref 0.00–0.07)
Basophils Absolute: 0 10*3/uL (ref 0.0–0.1)
Basophils Relative: 0 %
Eosinophils Absolute: 0.1 10*3/uL (ref 0.0–0.5)
Eosinophils Relative: 1 %
HCT: 33.2 % — ABNORMAL LOW (ref 36.0–46.0)
Hemoglobin: 10.3 g/dL — ABNORMAL LOW (ref 12.0–15.0)
Immature Granulocytes: 0 %
Lymphocytes Relative: 34 %
Lymphs Abs: 2.5 10*3/uL (ref 0.7–4.0)
MCH: 25.1 pg — ABNORMAL LOW (ref 26.0–34.0)
MCHC: 31 g/dL (ref 30.0–36.0)
MCV: 80.8 fL (ref 80.0–100.0)
Monocytes Absolute: 0.6 10*3/uL (ref 0.1–1.0)
Monocytes Relative: 8 %
Neutro Abs: 4 10*3/uL (ref 1.7–7.7)
Neutrophils Relative %: 57 %
Platelets: 262 10*3/uL (ref 150–400)
RBC: 4.11 MIL/uL (ref 3.87–5.11)
RDW: 14.5 % (ref 11.5–15.5)
WBC: 7.2 10*3/uL (ref 4.0–10.5)
nRBC: 0 % (ref 0.0–0.2)

## 2021-12-01 MED ORDER — CLONIDINE HCL 0.1 MG PO TABS: 0.1000 mg | ORAL_TABLET | Freq: Two times a day (BID) | ORAL | 0 refills | Status: DC

## 2021-12-01 MED ORDER — CLONIDINE HCL 0.1 MG PO TABS
0.1000 mg | ORAL_TABLET | Freq: Once | ORAL | Status: AC
Start: 2021-12-01 — End: 2021-12-01
  Administered 2021-12-01: 0.1 mg via ORAL
  Filled 2021-12-01: qty 1

## 2021-12-01 NOTE — Telephone Encounter (Signed)
Patient presented to the ED today with a blood pressure of 190/105. She is having no symptoms and her head CT was normal. Per dr Jens Som they are to give her clonidine 0.1 mg and then if her pressure comes down, they will send her home on clonidine 0.1 mg twice daily with close follow up. ?

## 2021-12-01 NOTE — Progress Notes (Signed)
?Cardiology Office Note:   ? ?Date:  12/01/2021  ? ?ID:  Carolyn Zimmerman, DOB 1985/11/01, MRN [id number] ? ?PCP:  Vevelyn Francois, NP  ?Cardiologist:  Berniece Salines, DO  ?Electrophysiologist:  None  ? ?Referring MD: Vevelyn Francois, NP  ? ?" I am nervous"  ? ?History of Present Illness:   ? ?Carolyn Zimmerman is a 36 y.o. female with a hx of hypertension, diabetes mellitus, here today for follow-up visit. ? ?I initially saw the patient on September 23, 2020 at that time she presented because she was experiencing shortness of breath.  During her visit she was significantly hypertensive.  We stopped her lisinopril and started her on valsartan 40 mg daily.  Continue hydrochlorothiazide and amlodipine.  I increased her Lasix because she did have bilateral leg edema to 40 mg twice daily that day for 5 days.  We also discussed her diabetes mellitus given the fact that she was significantly uncontrolled. ?  ?  ?At her last visit on October 06, 2021 we discussed in detail that her new diagnosis with the EF of 45%.  I attempted to put the patient on Entresto 24 to 25 mg twice a day but unfortunately after appealing this medication he still was not covered by the insurance company.  We will therefore transition her back to valsartan, and added carvedilol.  The patient on the Lasix and held her amlodipine due to the medication changes. ? ?At her visit on November 25, 2021 her blood pressure was elevated I wanted the patient to go to the emergency department but she had declined.  Therefore I had no choice but kept her on valsartan 320 mg daily, increase her carvedilol to 25 mg twice a day, started the patient on Aldactone 25 mg daily and refill her Lasix. ? ?Her regimen was then: Valsartan 320 mg daily, Coreg 25 mg twice daily, Aldactone 25 mg daily and Lasix 40 mg daily: Ultimately I really wanted the patient to go to the emergency department but she was not willing to at that time. ? ?Today she is here for follow-up visit.  Her blood pressure  remains elevated. ? ?Past Medical History:  ?Diagnosis Date  ? Abscess of chest wall 06/15/2017  ? Coronavirus infection 05/02/2020  ? Diabetes mellitus   ? Hypertension   ? Jaundice   ? Obesity   ? TOA (tubo-ovarian abscess) 09/2019  ? ? ?Past Surgical History:  ?Procedure Laterality Date  ? CHOLECYSTECTOMY    ? IRRIGATION AND DEBRIDEMENT ABSCESS N/A 05/02/2020  ? Procedure: IRRIGATION AND DEBRIDEMENT ABDOMINAL and CHEST WALL NECROTIZING FASCITITS;  Surgeon: Michael Boston, MD;  Location: [facility];  Service: General;  Laterality: N/A;  ? liver stent    ? ? ?Current Medications: ?Current Meds  ?Medication Sig  ? acetaminophen (TYLENOL) 500 MG tablet Take 2 tablets (1,000 mg total) by mouth every 8 (eight) hours as needed for mild pain.  ? albuterol (VENTOLIN HFA) 108 (90 Base) MCG/ACT inhaler Inhale 2 puffs into the lungs every 4 (four) hours as needed for wheezing or shortness of breath.  ? B-D ULTRAFINE III SHORT PEN 31G X 8 MM MISC See admin instructions.  ? blood glucose meter kit and supplies Dispense based on patient and insurance preference. Use up to four times daily as directed. (FOR ICD-9 250.00, 250.01).  ? carvedilol (COREG) 25 MG tablet Take 1 tablet (25 mg total) by mouth 2 (two) times daily.  ? furosemide (LASIX) 40 MG tablet Take 1 tablet (40 mg  total) by mouth daily.  ? Insulin Syringe-Needle U-100 (INSULIN SYRINGE .3CC/31GX5/16") 31G X 5/16" 0.3 ML MISC Use with insulin, twice daily  ? metFORMIN (GLUCOPHAGE) 1000 MG tablet Take 1 tablet (1,000 mg total) by mouth 2 (two) times daily with a meal.  ? spironolactone (ALDACTONE) 25 MG tablet Take 1 tablet (25 mg total) by mouth daily.  ? valsartan (DIOVAN) 320 MG tablet Take 1 tablet (320 mg total) by mouth daily.  ?  ? ?Allergies:   Contrast media [iodinated contrast media]  ? ?Social History  ? ?Socioeconomic History  ? Marital status: Single  ?  Spouse name: Not on file  ? Number of children: Not on file  ? Years of education: Not on file  ? Highest  education level: Not on file  ?Occupational History  ? Not on file  ?Tobacco Use  ? Smoking status: Former  ?  Packs/day: 0.50  ?  Types: Cigarettes  ? Smokeless tobacco: Never  ?Vaping Use  ? Vaping Use: Former  ?Substance and Sexual Activity  ? Alcohol use: Not Currently  ?  Comment: occ  ? Drug use: No  ? Sexual activity: Not on file  ?Other Topics Concern  ? Not on file  ?Social History Narrative  ? Not on file  ? ?Social Determinants of Health  ? ?Financial Resource Strain: Not on file  ?Food Insecurity: Not on file  ?Transportation Needs: Not on file  ?Physical Activity: Not on file  ?Stress: Not on file  ?Social Connections: Not on file  ?  ? ?Family History: ?The patient's family history includes Diabetes in her father; Hypertension in her father and mother. ? ?ROS:   ?Review of Systems  ?Constitution: Negative for decreased appetite, fever and weight gain.  ?HENT: Negative for congestion, ear discharge, hoarse voice and sore throat.   ?Eyes: Negative for discharge, redness, vision loss in right eye and visual halos.  ?Cardiovascular: Negative for chest pain, dyspnea on exertion, leg swelling, orthopnea and palpitations.  ?Respiratory: Negative for cough, hemoptysis, shortness of breath and snoring.   ?Endocrine: Negative for heat intolerance and polyphagia.  ?Hematologic/Lymphatic: Negative for bleeding problem. Does not bruise/bleed easily.  ?Skin: Negative for flushing, nail changes, rash and suspicious lesions.  ?Musculoskeletal: Negative for arthritis, joint pain, muscle cramps, myalgias, neck pain and stiffness.  ?Gastrointestinal: Negative for abdominal pain, bowel incontinence, diarrhea and excessive appetite.  ?Genitourinary: Negative for decreased libido, genital sores and incomplete emptying.  ?Neurological: Negative for brief paralysis, focal weakness, headaches and loss of balance.  ?Psychiatric/Behavioral: Negative for altered mental status, depression and suicidal ideas.   ?Allergic/Immunologic: Negative for HIV exposure and persistent infections.  ? ? ?EKGs/Labs/Other Studies Reviewed:   ? ?The following studies were reviewed today: ? ? ?EKG:  The ekg ordered today demonstrates  ? ?Recent Labs: ?08/13/2021: ALT 19; B Natriuretic Peptide 340.7; Hemoglobin 11.5; Platelets 227 ?11/25/2021: BUN 10; Creatinine, Ser 0.73; Magnesium 1.9; Potassium 4.2; Sodium 138  ?Recent Lipid Panel ?No results found for: CHOL, TRIG, HDL, CHOLHDL, VLDL, LDLCALC, LDLDIRECT ? ?Physical Exam:   ? ?VS:  BP (!) 208/118   Pulse (!) 58   Ht $R'5\' 7"'Nh$  (1.702 m)   Wt 281 lb 3.2 oz (127.6 kg)   LMP 11/22/2021   SpO2 100%   BMI 44.04 kg/m?    ? ?Wt Readings from Last 3 Encounters:  ?12/01/21 281 lb 3.2 oz (127.6 kg)  ?11/25/21 278 lb 3.2 oz (126.2 kg)  ?10/06/21 287 lb 6.4 oz (130.4 kg)  ?  ? ?  GEN: Well nourished, well developed in no acute distress ?HEENT: Normal ?NECK: No JVD; No carotid bruits ?LYMPHATICS: No lymphadenopathy ?CARDIAC: S1S2 noted,RRR, no murmurs, rubs, gallops ?RESPIRATORY:  Clear to auscultation without rales, wheezing or rhonchi  ?ABDOMEN: Soft, non-tender, non-distended, +bowel sounds, no guarding. ?EXTREMITIES: No edema, No cyanosis, no clubbing ?MUSCULOSKELETAL:  No deformity  ?SKIN: Warm and dry ?NEUROLOGIC:  Alert and oriented x 3, non-focal ?PSYCHIATRIC:  Normal affect, good insight ? ?ASSESSMENT:   ? ?1. Accelerated hypertension   ?2. Morbid obesity ([place])   ?3. Depressed left ventricular ejection fraction   ? ?PLAN:   ? ?1.  Her blood pressure remains elevated significantly.  Today I will explain to the patient that it is going to be very important for her to go to the emergency room for IV antihypertensive medication. ? ?I will prefer she do this as I will not be making any change to the oral medication because I prefer that she go to the ED possibly admission and then we can adjust antihypertensive medication at that time..  Right now she is on valsartan 320 mg a day, carvedilol 25 mg  twice a day, Aldactone 25 mg daily, and is taking Lasix due to significant leg swelling from amlodipine even at a low dose. ? ?Unfortunately Delene Loll was not moved by her insurance company is why she has not been transitioned to [employer]

## 2021-12-01 NOTE — ED Triage Notes (Addendum)
Pt. States symptoms started in December. States last Friday BP was 208/120. Dr. Rhina Brackett pt. Here today because of persistant problem. Pt. States having symptoms of dizziness and eye pressure. States is on Valsartan and is not helping with BP. Also takes lasix 40 mg. Spiralactone 25 mg, Carvedilol 25 mg. For control of BP. ?

## 2021-12-01 NOTE — ED Provider Notes (Signed)
?[facility] ?Provider Note ? ? ?CSN: [id number] ?Arrival date & time: 12/01/21  6945 ? ?  ? ?History ? ?Chief Complaint  ?Patient presents with  ? Hypertension  ? ? ?Carolyn Zimmerman is a 36 y.o. female. ? ?Patient is a 36 year old female who presents with elevated blood pressure.  She has a history of diabetes, hypertension and obesity.  She is followed by [facility] cardiology.  She is on several different blood pressure medications including valsartan, spironolactone, furosemide and Coreg.  She said her blood pressure has not been well managed over the last several months.  She has not been able to get it well controlled.  She says that she takes her medicine consistently and does not miss doses.  She complains of a bifrontal type headache with some pressure behind her eyes which she has had before when her pressures been elevated.  She denies any nausea or vomiting.  No dizziness.  No numbness or weakness to her extremities.  No current chest pain or shortness of breath.  Sometimes she does have some shortness of breath.  She was diagnosed with what she says is early heart failure based on echo.  Per chart review, I recent echo showed an EF of 45%.  She was seen at her cardiologist office this morning and her blood pressure was elevated.  She was sent here for further evaluation. ? ? ?  ? ?Home Medications ?Prior to Admission medications   ?Medication Sig Start Date End Date Taking? Authorizing Provider  ?carvedilol (COREG) 25 MG tablet Take 1 tablet (25 mg total) by mouth 2 (two) times daily. 11/25/21 02/23/22 Yes Tobb, Kardie, DO  ?cloNIDine (CATAPRES) 0.1 MG tablet Take 1 tablet (0.1 mg total) by mouth 2 (two) times daily. 12/01/21 01/30/22 Yes Malvin Johns, MD  ?furosemide (LASIX) 40 MG tablet Take 1 tablet (40 mg total) by mouth daily. 11/25/21  Yes Tobb, Kardie, DO  ?spironolactone (ALDACTONE) 25 MG tablet Take 1 tablet (25 mg total) by mouth daily. 11/25/21 02/23/22 Yes  Tobb, Kardie, DO  ?valsartan (DIOVAN) 320 MG tablet Take 1 tablet (320 mg total) by mouth daily. 11/04/21  Yes Tobb, Kardie, DO  ?acetaminophen (TYLENOL) 500 MG tablet Take 2 tablets (1,000 mg total) by mouth every 8 (eight) hours as needed for mild pain. 06/02/20   Azzie Glatter, FNP  ?albuterol (VENTOLIN HFA) 108 (90 Base) MCG/ACT inhaler Inhale 2 puffs into the lungs every 4 (four) hours as needed for wheezing or shortness of breath. 06/02/20   Azzie Glatter, FNP  ?B-D ULTRAFINE III SHORT PEN 31G X 8 MM MISC See admin instructions. 09/04/20   [provider]  ?blood glucose meter kit and supplies Dispense based on patient and insurance preference. Use up to four times daily as directed. (FOR ICD-9 250.00, 250.01). 06/02/20   Azzie Glatter, FNP  ?insulin aspart protamine- aspart (NOVOLOG MIX 70/30) (70-30) 100 UNIT/ML injection Inject 0.6 mLs (60 Units total) into the skin 2 (two) times daily with a meal. 05/31/20 11/25/21  Azzie Glatter, FNP  ?Insulin Syringe-Needle U-100 (INSULIN SYRINGE .3CC/31GX5/16") 31G X 5/16" 0.3 ML MISC Use with insulin, twice daily 05/12/20   Rai, Ripudeep K, MD  ?metFORMIN (GLUCOPHAGE) 1000 MG tablet Take 1 tablet (1,000 mg total) by mouth 2 (two) times daily with a meal. 08/10/20   Azzie Glatter, FNP  ?   ? ?Allergies    ?Contrast media [iodinated contrast media]   ? ?Review of Systems   ?  Review of Systems  ?Constitutional:  Negative for chills, diaphoresis, fatigue and fever.  ?HENT:  Negative for congestion, rhinorrhea and sneezing.   ?Eyes: Negative.   ?Respiratory:  Negative for cough, chest tightness and shortness of breath.   ?Cardiovascular:  Positive for leg swelling. Negative for chest pain.  ?Gastrointestinal:  Negative for abdominal pain, blood in stool, diarrhea, nausea and vomiting.  ?Genitourinary:  Negative for difficulty urinating, flank pain, frequency and hematuria.  ?Musculoskeletal:  Negative for arthralgias and back pain.  ?Skin:  Negative for  rash.  ?Neurological:  Positive for headaches. Negative for dizziness, speech difficulty, weakness and numbness.  ? ?Physical Exam ?Updated Vital Signs ?BP (!) 191/115   Pulse 70   Temp 98.2 ?F (36.8 ?C) (Oral)   Resp (!) 8   Ht 5' 7" (1.702 m)   Wt 127.5 kg   LMP 11/22/2021 (Exact Date)   SpO2 100%   BMI 44.01 kg/m?  ?Physical Exam ?Constitutional:   ?   Appearance: She is well-developed.  ?HENT:  ?   Head: Normocephalic and atraumatic.  ?Eyes:  ?   Pupils: Pupils are equal, round, and reactive to light.  ?Cardiovascular:  ?   Rate and Rhythm: Normal rate and regular rhythm.  ?   Heart sounds: Normal heart sounds.  ?Pulmonary:  ?   Effort: Pulmonary effort is normal. No respiratory distress.  ?   Breath sounds: Normal breath sounds. No wheezing or rales.  ?Chest:  ?   Chest wall: No tenderness.  ?Abdominal:  ?   General: Bowel sounds are normal.  ?   Palpations: Abdomen is soft.  ?   Tenderness: There is no abdominal tenderness. There is no guarding or rebound.  ?Musculoskeletal:     ?   General: Normal range of motion.  ?   Cervical back: Normal range of motion and neck supple.  ?Lymphadenopathy:  ?   Cervical: No cervical adenopathy.  ?Skin: ?   General: Skin is warm and dry.  ?   Findings: No rash.  ?Neurological:  ?   General: No focal deficit present.  ?   Mental Status: She is alert and oriented to person, place, and time.  ? ? ?ED Results / Procedures / Treatments   ?Labs ?(all labs ordered are listed, but only abnormal results are displayed) ?Labs Reviewed  ?BASIC METABOLIC PANEL - Abnormal; Notable for the following components:  ?    Result Value  ? Glucose, Bld 303 (*)   ? All other components within normal limits  ?CBC WITH DIFFERENTIAL/PLATELET - Abnormal; Notable for the following components:  ? Hemoglobin 10.3 (*)   ? HCT 33.2 (*)   ? MCH 25.1 (*)   ? All other components within normal limits  ?CBC WITH DIFFERENTIAL/PLATELET  ? ? ?EKG ?EKG Interpretation ? ?Date/Time:  Thursday December 01 2021  10:51:46 EDT ?Ventricular Rate:  69 ?PR Interval:  186 ?QRS Duration: 111 ?QT Interval:  448 ?QTC Calculation: 480 ?R Axis:   62 ?Text Interpretation: Sinus rhythm Left atrial enlargement Repol abnrm suggests ischemia, lateral leads since last tracing no significant change Confirmed by Malvin Johns [phone]) on 12/01/2021 11:05:40 AM ? ?Radiology ?DG Chest 2 View ? ?Result Date: 12/01/2021 ?CLINICAL DATA:  Shortness of breath EXAM: CHEST - 2 VIEW COMPARISON:  08/13/2021 FINDINGS: Transverse diameter of heart is increased. Central pulmonary vessels are less prominent. There are no signs of alveolar pulmonary edema. There is no focal pulmonary consolidation. There is no significant pleural effusion or  pneumothorax. IMPRESSION: Cardiomegaly. Central pulmonary vessels are prominent without signs of alveolar pulmonary edema. There are no focal pulmonary infiltrates. Electronically Signed   By: Elmer Picker M.D.   On: 12/01/2021 11:57  ? ?CT Head Wo Contrast ? ?Result Date: 12/01/2021 ?CLINICAL DATA:  Headache, chronic, new features or increased frequency EXAM: CT HEAD WITHOUT CONTRAST TECHNIQUE: Contiguous axial images were obtained from the base of the skull through the vertex without intravenous contrast. RADIATION DOSE REDUCTION: This exam was performed according to the departmental dose-optimization program which includes automated exposure control, adjustment of the mA and/or kV according to patient size and/or use of iterative reconstruction technique. COMPARISON:  2018 FINDINGS: Brain: There is no acute intracranial hemorrhage, mass effect, or edema. Gray-white differentiation is preserved. There is no extra-axial fluid collection. Ventricles and sulci are within normal limits in size and configuration. Vascular: No hyperdense vessel or unexpected calcification. Skull: Calvarium is unremarkable. Sinuses/Orbits: No acute finding. Other: None. IMPRESSION: No acute the or significant intracranial abnormality.  Electronically Signed   By: Macy Mis M.D.   On: 12/01/2021 12:39   ? ?Procedures ?Procedures  ? ? ?Medications Ordered in ED ?Medications  ?cloNIDine (CATAPRES) tablet 0.1 mg (0.1 mg Oral Given 12/01/21 1324)

## 2021-12-01 NOTE — ED Notes (Signed)
This cna tried to obtain a manual blood pressure but was unable to. I repositioned a smaller cuff on her wrist for an automatic reading. ?

## 2021-12-01 NOTE — Patient Instructions (Signed)
Medication Instructions:  ?Your physician recommends that you continue on your current medications as directed. Please refer to the Current Medication list given to you today.  ?*If you need a refill on your cardiac medications before your next appointment, please call your pharmacy* ? ? ?Lab Work: ?None ?If you have labs (blood work) drawn today and your tests are completely normal, you will receive your results only by: ?MyChart Message (if you have MyChart) OR ?A paper copy in the mail ?If you have any lab test that is abnormal or we need to change your treatment, we will call you to review the results. ? ? ?Testing/Procedures: ?None ? ? ?Follow-Up: ?At [facility], you and your health needs are our priority.  As part of our continuing mission to provide you with exceptional heart care, we have created designated Provider Care Teams.  These Care Teams include your primary Cardiologist (physician) and Advanced Practice Providers (APPs -  Physician Assistants and Nurse Practitioners) who all work together to provide you with the care you need, when you need it. ? ?We recommend signing up for the patient portal called "MyChart".  Sign up information is provided on this After Visit Summary.  MyChart is used to connect with patients for Virtual Visits (Telemedicine).  Patients are able to view lab/test results, encounter notes, upcoming appointments, etc.  Non-urgent messages can be sent to your provider as well.   ?To learn more about what you can do with MyChart, go to [email or web address].   ? ?Your next appointment:   ?1 week(s) ? ?The format for your next appointment:   ?In Person ? ?Provider:   ?Thomasene Ripple, DO   ? ? ?Other Instructions ? Please go to the Emergency Department to have blood pressure evaluated.   ?

## 2021-12-08 ENCOUNTER — Other Ambulatory Visit: Payer: Self-pay

## 2021-12-08 ENCOUNTER — Ambulatory Visit: Payer: PRIVATE HEALTH INSURANCE | Admitting: Cardiology

## 2021-12-08 ENCOUNTER — Encounter: Payer: Self-pay | Admitting: Cardiology

## 2021-12-08 ENCOUNTER — Ambulatory Visit (INDEPENDENT_AMBULATORY_CARE_PROVIDER_SITE_OTHER): Payer: PRIVATE HEALTH INSURANCE | Admitting: Cardiology

## 2021-12-08 VITALS — BP 166/103 | HR 74 | Ht 67.0 in | Wt 277.8 lb

## 2021-12-08 DIAGNOSIS — R5383 Other fatigue: Secondary | ICD-10-CM

## 2021-12-08 DIAGNOSIS — I10 Essential (primary) hypertension: Secondary | ICD-10-CM | POA: Insufficient documentation

## 2021-12-08 DIAGNOSIS — R0989 Other specified symptoms and signs involving the circulatory and respiratory systems: Secondary | ICD-10-CM

## 2021-12-08 DIAGNOSIS — R400 Somnolence: Secondary | ICD-10-CM

## 2021-12-08 MED ORDER — AMLODIPINE BESYLATE 10 MG PO TABS: 10.0000 mg | ORAL_TABLET | Freq: Every day | ORAL | 3 refills | Status: DC

## 2021-12-08 NOTE — Progress Notes (Signed)
?Cardiology Office Note:   ? ?Date:  12/08/2021  ? ?ID:  Carolyn Zimmerman, DOB 07/27/1986, MRN [id number] ? ?PCP:  Vevelyn Francois, NP  ?Cardiologist:  Berniece Salines, DO  ?Electrophysiologist:  None  ? ?Referring MD: Vevelyn Francois, NP  ? ?" I am doing fine " ? ?History of Present Illness:   ? ?Carolyn Zimmerman is a 36 y.o. female with a hx of  hypertension, diabetes mellitus, here today for follow-up visit. ? ?I initially saw the patient on September 23, 2020 at that time she presented because she was experiencing shortness of breath.  During her visit she was significantly hypertensive.  We stopped her lisinopril and started her on valsartan 40 mg daily.  Continue hydrochlorothiazide and amlodipine.  I increased her Lasix because she did have bilateral leg edema to 40 mg twice daily that day for 5 days.  We also discussed her diabetes mellitus given the fact that she was significantly uncontrolled. ?  ?  ?At her last visit on October 06, 2021 we discussed in detail that her new diagnosis with the EF of 45%.  I attempted to put the patient on Entresto 24 to 25 mg twice a day but unfortunately after appealing this medication he still was not covered by the insurance company.  We will therefore transition her back to valsartan, and added carvedilol.  The patient on the Lasix and held her amlodipine due to the medication changes. ?  ?At her visit on November 25, 2021 her blood pressure was elevated I wanted the patient to go to the emergency department but she had declined.  Therefore I had no choice but kept her on valsartan 320 mg daily, increase her carvedilol to 25 mg twice a day, started the patient on Aldactone 25 mg daily and refill her Lasix. ?  ?Her regimen was then: Valsartan 320 mg daily, Coreg 25 mg twice daily, Aldactone 25 mg daily and Lasix 40 mg daily: Ultimately I really wanted the patient to go to the emergency department but she was not willing to at that time. ? ?At her last visit she was hypertensive I sent the  patient to emergency department.  While in emergency department she was giving clonidine.  And was discharged home.  She has been taking the clonidine.  She is here today for follow-up visit.  She tells me at home her blood pressure systolics in the 887N diastolics this low 797K. ? ?She has a shared with me that she has been experiencing significant fatigue, daytime somnolence. ? ? ?Past Medical History:  ?Diagnosis Date  ? Abscess of chest wall 06/15/2017  ? Coronavirus infection 05/02/2020  ? Diabetes mellitus   ? Hypertension   ? Jaundice   ? Obesity   ? TOA (tubo-ovarian abscess) 09/2019  ? ? ?Past Surgical History:  ?Procedure Laterality Date  ? CHOLECYSTECTOMY    ? IRRIGATION AND DEBRIDEMENT ABSCESS N/A 05/02/2020  ? Procedure: IRRIGATION AND DEBRIDEMENT ABDOMINAL and CHEST WALL NECROTIZING FASCITITS;  Surgeon: Michael Boston, MD;  Location: [facility];  Service: General;  Laterality: N/A;  ? liver stent    ? ? ?Current Medications: ?Current Meds  ?Medication Sig  ? acetaminophen (TYLENOL) 500 MG tablet Take 2 tablets (1,000 mg total) by mouth every 8 (eight) hours as needed for mild pain.  ? albuterol (VENTOLIN HFA) 108 (90 Base) MCG/ACT inhaler Inhale 2 puffs into the lungs every 4 (four) hours as needed for wheezing or shortness of breath.  ? amLODipine (NORVASC) 10  MG tablet Take 1 tablet (10 mg total) by mouth daily.  ? B-D ULTRAFINE III SHORT PEN 31G X 8 MM MISC See admin instructions.  ? blood glucose meter kit and supplies Dispense based on patient and insurance preference. Use up to four times daily as directed. (FOR ICD-9 250.00, 250.01).  ? carvedilol (COREG) 25 MG tablet Take 1 tablet (25 mg total) by mouth 2 (two) times daily.  ? cloNIDine (CATAPRES) 0.1 MG tablet Take 1 tablet (0.1 mg total) by mouth 2 (two) times daily.  ? furosemide (LASIX) 40 MG tablet Take 1 tablet (40 mg total) by mouth daily.  ? insulin aspart protamine- aspart (NOVOLOG MIX 70/30) (70-30) 100 UNIT/ML injection Inject 0.6 mLs (60  Units total) into the skin 2 (two) times daily with a meal.  ? Insulin Syringe-Needle U-100 (INSULIN SYRINGE .3CC/31GX5/16") 31G X 5/16" 0.3 ML MISC Use with insulin, twice daily  ? metFORMIN (GLUCOPHAGE) 1000 MG tablet Take 1 tablet (1,000 mg total) by mouth 2 (two) times daily with a meal.  ? spironolactone (ALDACTONE) 25 MG tablet Take 1 tablet (25 mg total) by mouth daily.  ? valsartan (DIOVAN) 320 MG tablet Take 1 tablet (320 mg total) by mouth daily.  ?  ? ?Allergies:   Contrast media [iodinated contrast media]  ? ?Social History  ? ?Socioeconomic History  ? Marital status: Single  ?  Spouse name: Not on file  ? Number of children: Not on file  ? Years of education: Not on file  ? Highest education level: Not on file  ?Occupational History  ? Not on file  ?Tobacco Use  ? Smoking status: Former  ?  Packs/day: 0.50  ?  Types: Cigarettes  ? Smokeless tobacco: Never  ?Vaping Use  ? Vaping Use: Former  ?Substance and Sexual Activity  ? Alcohol use: Not Currently  ?  Comment: occ  ? Drug use: No  ? Sexual activity: Not on file  ?Other Topics Concern  ? Not on file  ?Social History Narrative  ? Not on file  ? ?Social Determinants of Health  ? ?Financial Resource Strain: Not on file  ?Food Insecurity: Not on file  ?Transportation Needs: Not on file  ?Physical Activity: Not on file  ?Stress: Not on file  ?Social Connections: Not on file  ?  ? ?Family History: ?The patient's family history includes Diabetes in her father; Hypertension in her father and mother. ? ?ROS:   ?Review of Systems  ?Constitution: Negative for decreased appetite, fever and weight gain.  ?HENT: Negative for congestion, ear discharge, hoarse voice and sore throat.   ?Eyes: Negative for discharge, redness, vision loss in right eye and visual halos.  ?Cardiovascular: Negative for chest pain, dyspnea on exertion, leg swelling, orthopnea and palpitations.  ?Respiratory: Negative for cough, hemoptysis, shortness of breath and snoring.   ?Endocrine:  Negative for heat intolerance and polyphagia.  ?Hematologic/Lymphatic: Negative for bleeding problem. Does not bruise/bleed easily.  ?Skin: Negative for flushing, nail changes, rash and suspicious lesions.  ?Musculoskeletal: Negative for arthritis, joint pain, muscle cramps, myalgias, neck pain and stiffness.  ?Gastrointestinal: Negative for abdominal pain, bowel incontinence, diarrhea and excessive appetite.  ?Genitourinary: Negative for decreased libido, genital sores and incomplete emptying.  ?Neurological: Negative for brief paralysis, focal weakness, headaches and loss of balance.  ?Psychiatric/Behavioral: Negative for altered mental status, depression and suicidal ideas.  ?Allergic/Immunologic: Negative for HIV exposure and persistent infections.  ? ? ?EKGs/Labs/Other Studies Reviewed:   ? ?The following studies were reviewed  today: ? ? ?EKG:  The ekg ordered today demonstrates  ? ?Recent Labs: ?08/13/2021: ALT 19; B Natriuretic Peptide 340.7 ?11/25/2021: Magnesium 1.9 ?12/01/2021: BUN 13; Creatinine, Ser 0.85; Hemoglobin 10.3; Platelets 262; Potassium 3.9; Sodium 136  ?Recent Lipid Panel ?No results found for: CHOL, TRIG, HDL, CHOLHDL, VLDL, LDLCALC, LDLDIRECT ? ?Physical Exam:   ? ?VS:  BP (!) 166/103   Pulse 74   Ht _0  (1.702 m)   Wt 277 lb 12.8 oz (126 kg)   LMP 11/22/2021 (Exact Date)   SpO2 99%   BMI 43.51 kg/m?    ? ?Wt Readings from Last 3 Encounters:  ?12/08/21 277 lb 12.8 oz (126 kg)  ?12/01/21 281 lb (127.5 kg)  ?12/01/21 281 lb 3.2 oz (127.6 kg)  ?  ? ?GEN: Well nourished, well developed in no acute distress ?HEENT: Normal ?NECK: No JVD; No carotid bruits ?LYMPHATICS: No lymphadenopathy ?CARDIAC: S1S2 noted,RRR, no murmurs, rubs, gallops ?RESPIRATORY:  Clear to auscultation without rales, wheezing or rhonchi  ?ABDOMEN: Soft, non-tender, non-distended, +bowel sounds, no guarding. ?EXTREMITIES: No edema, No cyanosis, no clubbing ?MUSCULOSKELETAL:  No deformity  ?SKIN: Warm and dry ?NEUROLOGIC:   Alert and oriented x 3, non-focal ?PSYCHIATRIC:  Normal affect, good insight ? ?ASSESSMENT:   ? ?1. Accelerated hypertension   ?2. Daytime somnolence   ?3. Fatigue, unspecified type   ?4. Obesity, Class III, BMI

## 2021-12-08 NOTE — Patient Instructions (Addendum)
Medication Instructions:  ?Your physician has recommended you make the following change in your medication:  ?START: Amlodipine 10 mg once daily ?*If you need a refill on your cardiac medications before your next appointment, please call your pharmacy* ? ? ?Lab Work: ?Your physician recommends that you return for lab work in:  ?TODAY: BMET, Mag, TSH  ?If you have labs (blood work) drawn today and your tests are completely normal, you will receive your results only by: ?MyChart Message (if you have MyChart) OR ?A paper copy in the mail ?If you have any lab test that is abnormal or we need to change your treatment, we will call you to review the results. ? ? ?Testing/Procedures: ?Your physician has requested that you have a upper extremity arterial duplex. This test is an ultrasound of the arteries in the arms. It looks at arterial blood flow in the arms. Allow one hour for Upper Arterial scans. There are no restrictions or special instructions. ? ?Your physician has requested that you have a renal artery duplex. During this test, an ultrasound is used to evaluate blood flow to the kidneys. Allow one hour for this exam. Do not eat after midnight the day before and avoid carbonated beverages. Take your medications as you usually do. ? ? ? ? ? ?Follow-Up: ?At [facility], you and your health needs are our priority.  As part of our continuing mission to provide you with exceptional heart care, we have created designated Provider Care Teams.  These Care Teams include your primary Cardiologist (physician) and Advanced Practice Providers (APPs -  Physician Assistants and Nurse Practitioners) who all work together to provide you with the care you need, when you need it. ? ?We recommend signing up for the patient portal called "MyChart".  Sign up information is provided on this After Visit Summary.  MyChart is used to connect with patients for Virtual Visits (Telemedicine).  Patients are able to view lab/test results,  encounter notes, upcoming appointments, etc.  Non-urgent messages can be sent to your provider as well.   ?To learn more about what you can do with MyChart, go to [email or web address].   ? ?Your next appointment:   ?Week of April 10th ? ?The format for your next appointment:   ?In Person ? ?Provider:   ?Thomasene Ripple, DO   ? ? ?Other Instructions ?  ? ?

## 2021-12-14 ENCOUNTER — Telehealth: Payer: Self-pay | Admitting: *Deleted

## 2021-12-14 NOTE — Telephone Encounter (Signed)
Left message sleep study has been scheduled. Check mychart for appointment details. ?

## 2021-12-17 LAB — PTH, INTACT AND CALCIUM: PTH: 25 pg/mL (ref 15–65)

## 2021-12-17 LAB — BASIC METABOLIC PANEL
BUN/Creatinine Ratio: 19 (ref 9–23)
BUN: 17 mg/dL (ref 6–20)
CO2: 22 mmol/L (ref 20–29)
Calcium: 9.6 mg/dL (ref 8.7–10.2)
Chloride: 99 mmol/L (ref 96–106)
Creatinine, Ser: 0.89 mg/dL (ref 0.57–1.00)
Glucose: 272 mg/dL — ABNORMAL HIGH (ref 70–99)
Potassium: 4.7 mmol/L (ref 3.5–5.2)
Sodium: 137 mmol/L (ref 134–144)
eGFR: 88 mL/min/{1.73_m2} (ref >59)

## 2021-12-17 LAB — TSH+T4F+T3FREE
Free T4: 1.16 ng/dL (ref 0.82–1.77)
T3, Free: 2.7 pg/mL (ref 2.0–4.4)
TSH: 0.786 u[IU]/mL (ref 0.450–4.500)

## 2021-12-17 LAB — PHOSPHORUS: Phosphorus: 4.7 mg/dL — ABNORMAL HIGH (ref 3.0–4.3)

## 2021-12-17 LAB — MAGNESIUM: Magnesium: 1.7 mg/dL (ref 1.6–2.3)

## 2021-12-17 LAB — ALDOSTERONE + RENIN ACTIVITY W/ RATIO
ALDOS/RENIN RATIO: 35 — ABNORMAL HIGH (ref 0.0–30.0)
ALDOSTERONE: 11.7 ng/dL (ref 0.0–30.0)
Renin: 0.334 ng/mL/hr (ref 0.167–5.380)

## 2021-12-17 LAB — CATECHOLAMINES, FRACTIONATED, PLASMA
Dopamine: <30 pg/mL (ref 0–48)
Epinephrine: <15 pg/mL (ref 0–62)
Norepinephrine: 406 pg/mL (ref 0–874)

## 2021-12-21 ENCOUNTER — Ambulatory Visit (HOSPITAL_COMMUNITY): Payer: PRIVATE HEALTH INSURANCE

## 2021-12-21 ENCOUNTER — Ambulatory Visit (HOSPITAL_COMMUNITY)
Admission: RE | Admit: 2021-12-21 | Payer: PRIVATE HEALTH INSURANCE | Source: Ambulatory Visit | Attending: Cardiology | Admitting: Cardiology

## 2021-12-29 ENCOUNTER — Ambulatory Visit (INDEPENDENT_AMBULATORY_CARE_PROVIDER_SITE_OTHER): Payer: PRIVATE HEALTH INSURANCE | Admitting: Cardiology

## 2021-12-29 ENCOUNTER — Encounter: Payer: Self-pay | Admitting: Cardiology

## 2021-12-29 VITALS — BP 142/92 | HR 92 | Ht 67.0 in | Wt 276.0 lb

## 2021-12-29 DIAGNOSIS — Z79899 Other long term (current) drug therapy: Secondary | ICD-10-CM | POA: Diagnosis not present

## 2021-12-29 DIAGNOSIS — I119 Hypertensive heart disease without heart failure: Secondary | ICD-10-CM

## 2021-12-29 DIAGNOSIS — E108 Type 1 diabetes mellitus with unspecified complications: Secondary | ICD-10-CM | POA: Insufficient documentation

## 2021-12-29 DIAGNOSIS — Z13220 Encounter for screening for lipoid disorders: Secondary | ICD-10-CM | POA: Diagnosis not present

## 2021-12-29 MED ORDER — ROSUVASTATIN CALCIUM 10 MG PO TABS: 10.0000 mg | ORAL_TABLET | Freq: Every day | ORAL | 3 refills | Status: DC

## 2021-12-29 MED ORDER — VALSARTAN 320 MG PO TABS: 320.0000 mg | ORAL_TABLET | Freq: Every day | ORAL | 3 refills | Status: DC

## 2021-12-29 MED ORDER — HYDROCHLOROTHIAZIDE 25 MG PO TABS: 25.0000 mg | ORAL_TABLET | Freq: Every day | ORAL | 3 refills | Status: DC

## 2021-12-29 NOTE — Patient Instructions (Signed)
Medication Instructions:  ?Your physician has recommended you make the following change in your medication:  ?STOP (Taper): Clonidine one dose today, half dose tomorrow, stop on Saturday.  ?START: Hydralazine 25 mg twice daily ?START: Crestor 10 mg daily  ?*If you need a refill on your cardiac medications before your next appointment, please call your pharmacy* ? ? ?Lab Work: ?Your physician recommends that you return for lab work in:  ?TODAY: BMET, Mag, Lipids, Lp(a)  ?If you have labs (blood work) drawn today and your tests are completely normal, you will receive your results only by: ?MyChart Message (if you have MyChart) OR ?A paper copy in the mail ?If you have any lab test that is abnormal or we need to change your treatment, we will call you to review the results. ? ? ?Testing/Procedures: ?None ? ? ?Follow-Up: ?At [facility], you and your health needs are our priority.  As part of our continuing mission to provide you with exceptional heart care, we have created designated Provider Care Teams.  These Care Teams include your primary Cardiologist (physician) and Advanced Practice Providers (APPs -  Physician Assistants and Nurse Practitioners) who all work together to provide you with the care you need, when you need it. ? ?We recommend signing up for the patient portal called "MyChart".  Sign up information is provided on this After Visit Summary.  MyChart is used to connect with patients for Virtual Visits (Telemedicine).  Patients are able to view lab/test results, encounter notes, upcoming appointments, etc.  Non-urgent messages can be sent to your provider as well.   ?To learn more about what you can do with MyChart, go to [email or web address].   ? ?Your next appointment:   ?2 week(s) ? ?The format for your next appointment:   ?In Person ? ?Provider:   ?Thomasene Ripple, DO   ? ? ?Other Instructions ? ? ?Important Information About Sugar ? ? ? ? ?  ?

## 2021-12-29 NOTE — Progress Notes (Signed)
?Cardiology Office Note:   ? ?Date:  12/29/2021  ? ?ID:  Carolyn Zimmerman, DOB 03-01-1986, MRN [id number] ? ?PCP:  Vevelyn Francois, NP  ?Cardiologist:  Berniece Salines, DO  ?Electrophysiologist:  None  ? ?Referring MD: Vevelyn Francois, NP  ? ?" I am doing well" ? ?History of Present Illness:   ? ?Carolyn Zimmerman is a 36 y.o. female with a hx of hypertension, diabetes mellitus, here today for follow-up visit. ? ?I initially saw the patient on September 23, 2020 at that time she presented because she was experiencing shortness of breath.  During her visit she was significantly hypertensive.  We stopped her lisinopril and started her on valsartan 40 mg daily.  Continue hydrochlorothiazide and amlodipine.  I increased her Lasix because she did have bilateral leg edema to 40 mg twice daily that day for 5 days.  We also discussed her diabetes mellitus given the fact that she was significantly uncontrolled. ?  ?  ?At her  visit on October 06, 2021 we discussed in detail that her new diagnosis with the EF of 45%.  I attempted to put the patient on Entresto 24 to 25 mg twice a day but unfortunately after appealing this medication he still was not covered by the insurance company.  We will therefore transition her back to valsartan, and added carvedilol.  The patient on the Lasix and held her amlodipine due to the medication changes. ?  ?At her visit on November 25, 2021 her blood pressure was elevated I wanted the patient to go to the emergency department but she had declined.  Therefore I had no choice but kept her on valsartan 320 mg daily, increase her carvedilol to 25 mg twice a day, started the patient on Aldactone 25 mg daily and refill her Lasix.  ? ?Her regimen was then: Valsartan 320 mg daily, Coreg 25 mg twice daily, Aldactone 25 mg daily and Lasix 40 mg daily: Ultimately I really wanted the patient to go to the emergency department but she was not willing to at that time. ?  ?At her visit  on 12/01/2021 she was hypertensive I sent  the patient to emergency department.  While in emergency department she was giving clonidine.  And was discharged home.  She has been taking the clonidine.  She is here today for follow-up visit.   ? ?At her visit on December 08, 2021 I kept the patient on her valsartan 320 mg daily, carvedilol 25 mg a day, clonidine 0.1 mg twice a day and Aldactone.  We added amlodipine.  I also placed work-up for the patient for secondary hypertension. ? ?Today she tells me she thinks her clonidine is making her significantly weak.  Although her blood pressure has improved significantly. ? ?Her blood pressure at home has been in the 130s and 140s.  She is really happy about this.  She is experiencing some leg pain today. ? ?Past Medical History:  ?Diagnosis Date  ? Abscess of chest wall 06/15/2017  ? Coronavirus infection 05/02/2020  ? Diabetes mellitus   ? Hypertension   ? Jaundice   ? Obesity   ? TOA (tubo-ovarian abscess) 09/2019  ? ? ?Past Surgical History:  ?Procedure Laterality Date  ? CHOLECYSTECTOMY    ? IRRIGATION AND DEBRIDEMENT ABSCESS N/A 05/02/2020  ? Procedure: IRRIGATION AND DEBRIDEMENT ABDOMINAL and CHEST WALL NECROTIZING FASCITITS;  Surgeon: Michael Boston, MD;  Location: [facility];  Service: General;  Laterality: N/A;  ? liver stent    ? ? ?  Current Medications: ?Current Meds  ?Medication Sig  ? acetaminophen (TYLENOL) 500 MG tablet Take 2 tablets (1,000 mg total) by mouth every 8 (eight) hours as needed for mild pain.  ? albuterol (VENTOLIN HFA) 108 (90 Base) MCG/ACT inhaler Inhale 2 puffs into the lungs every 4 (four) hours as needed for wheezing or shortness of breath.  ? amLODipine (NORVASC) 10 MG tablet Take 1 tablet (10 mg total) by mouth daily.  ? B-D ULTRAFINE III SHORT PEN 31G X 8 MM MISC See admin instructions.  ? blood glucose meter kit and supplies Dispense based on patient and insurance preference. Use up to four times daily as directed. (FOR ICD-9 250.00, 250.01).  ? carvedilol (COREG) 25 MG tablet Take 1  tablet (25 mg total) by mouth 2 (two) times daily.  ? furosemide (LASIX) 40 MG tablet Take 1 tablet (40 mg total) by mouth daily.  ? insulin aspart protamine- aspart (NOVOLOG MIX 70/30) (70-30) 100 UNIT/ML injection Inject 0.6 mLs (60 Units total) into the skin 2 (two) times daily with a meal.  ? Insulin Syringe-Needle U-100 (INSULIN SYRINGE .3CC/31GX5/16") 31G X 5/16" 0.3 ML MISC Use with insulin, twice daily  ? metFORMIN (GLUCOPHAGE) 1000 MG tablet Take 1 tablet (1,000 mg total) by mouth 2 (two) times daily with a meal.  ? rosuvastatin (CRESTOR) 10 MG tablet Take 1 tablet (10 mg total) by mouth daily.  ? spironolactone (ALDACTONE) 25 MG tablet Take 1 tablet (25 mg total) by mouth daily.  ? [DISCONTINUED] cloNIDine (CATAPRES) 0.1 MG tablet Take 1 tablet (0.1 mg total) by mouth 2 (two) times daily.  ? [DISCONTINUED] hydrochlorothiazide (HYDRODIURIL) 25 MG tablet Take 1 tablet (25 mg total) by mouth daily.  ? [DISCONTINUED] valsartan (DIOVAN) 320 MG tablet Take 1 tablet (320 mg total) by mouth daily.  ?  ? ?Allergies:   Contrast media [iodinated contrast media]  ? ?Social History  ? ?Socioeconomic History  ? Marital status: Single  ?  Spouse name: Not on file  ? Number of children: Not on file  ? Years of education: Not on file  ? Highest education level: Not on file  ?Occupational History  ? Not on file  ?Tobacco Use  ? Smoking status: Former  ?  Packs/day: 0.50  ?  Types: Cigarettes  ? Smokeless tobacco: Never  ?Vaping Use  ? Vaping Use: Former  ?Substance and Sexual Activity  ? Alcohol use: Not Currently  ?  Comment: occ  ? Drug use: No  ? Sexual activity: Not on file  ?Other Topics Concern  ? Not on file  ?Social History Narrative  ? Not on file  ? ?Social Determinants of Health  ? ?Financial Resource Strain: Not on file  ?Food Insecurity: Not on file  ?Transportation Needs: Not on file  ?Physical Activity: Not on file  ?Stress: Not on file  ?Social Connections: Not on file  ?  ? ?Family History: ?The patient's  family history includes Diabetes in her father; Hypertension in her father and mother. ? ?ROS:   ?Review of Systems  ?Constitution: Negative for decreased appetite, fever and weight gain.  ?HENT: Negative for congestion, ear discharge, hoarse voice and sore throat.   ?Eyes: Negative for discharge, redness, vision loss in right eye and visual halos.  ?Cardiovascular: Negative for chest pain, dyspnea on exertion, leg swelling, orthopnea and palpitations.  ?Respiratory: Negative for cough, hemoptysis, shortness of breath and snoring.   ?Endocrine: Negative for heat intolerance and polyphagia.  ?Hematologic/Lymphatic: Negative for bleeding problem.  Does not bruise/bleed easily.  ?Skin: Negative for flushing, nail changes, rash and suspicious lesions.  ?Musculoskeletal: Negative for arthritis, joint pain, muscle cramps, myalgias, neck pain and stiffness.  ?Gastrointestinal: Negative for abdominal pain, bowel incontinence, diarrhea and excessive appetite.  ?Genitourinary: Negative for decreased libido, genital sores and incomplete emptying.  ?Neurological: Negative for brief paralysis, focal weakness, headaches and loss of balance.  ?Psychiatric/Behavioral: Negative for altered mental status, depression and suicidal ideas.  ?Allergic/Immunologic: Negative for HIV exposure and persistent infections.  ? ? ?EKGs/Labs/Other Studies Reviewed:   ? ?The following studies were reviewed today: ? ? ?EKG: None today ? ?Transthoracic echocardiogram September 29, 2021 ?IMPRESSIONS  ? ? ? 1. Left ventricular ejection fraction by 3D volume is 45 %. The left  ?ventricle has moderately decreased function. The left ventricle  ?demonstrates global hypokinesis. There is moderate left ventricular  ?hypertrophy. Left ventricular diastolic parameters  ?are indeterminate. The average left ventricular global longitudinal strain  ?is -7.7 %. The global longitudinal strain is abnormal.  ? 2. Right ventricular systolic function is normal. The right  ventricular  ?size is normal.  ? 3. Left atrial size was severely dilated.  ? 4. Right atrial size was mildly dilated.  ? 5. A small pericardial effusion is present. The pericardial effusion is  ?circumferent

## 2021-12-30 LAB — BASIC METABOLIC PANEL
BUN/Creatinine Ratio: 21 (ref 9–23)
BUN: 17 mg/dL (ref 6–20)
CO2: 23 mmol/L (ref 20–29)
Calcium: 9.4 mg/dL (ref 8.7–10.2)
Chloride: 97 mmol/L (ref 96–106)
Creatinine, Ser: 0.82 mg/dL (ref 0.57–1.00)
Glucose: 360 mg/dL — ABNORMAL HIGH (ref 70–99)
Potassium: 4.4 mmol/L (ref 3.5–5.2)
Sodium: 133 mmol/L — ABNORMAL LOW (ref 134–144)
eGFR: 97 mL/min/{1.73_m2} (ref >59)

## 2021-12-30 LAB — LIPID PANEL
Chol/HDL Ratio: 5.1 ratio — ABNORMAL HIGH (ref 0.0–4.4)
Cholesterol, Total: 172 mg/dL (ref 100–199)
HDL: 34 mg/dL — ABNORMAL LOW (ref >39)
LDL Chol Calc (NIH): 100 mg/dL — ABNORMAL HIGH (ref 0–99)
Triglycerides: 219 mg/dL — ABNORMAL HIGH (ref 0–149)
VLDL Cholesterol Cal: 38 mg/dL (ref 5–40)

## 2021-12-30 LAB — LIPOPROTEIN A (LPA): Lipoprotein (a): 168 nmol/L — ABNORMAL HIGH (ref <75.0)

## 2021-12-30 LAB — MAGNESIUM: Magnesium: 1.7 mg/dL (ref 1.6–2.3)

## 2022-01-12 ENCOUNTER — Encounter: Payer: Self-pay | Admitting: Pharmacist

## 2022-01-12 ENCOUNTER — Ambulatory Visit (INDEPENDENT_AMBULATORY_CARE_PROVIDER_SITE_OTHER): Payer: PRIVATE HEALTH INSURANCE | Admitting: Pharmacist

## 2022-01-12 VITALS — BP 130/90 | HR 74 | Resp 15 | Ht 67.5 in | Wt 278.0 lb

## 2022-01-12 DIAGNOSIS — I119 Hypertensive heart disease without heart failure: Secondary | ICD-10-CM | POA: Diagnosis not present

## 2022-01-12 DIAGNOSIS — I10 Essential (primary) hypertension: Secondary | ICD-10-CM | POA: Diagnosis not present

## 2022-01-12 DIAGNOSIS — E1165 Type 2 diabetes mellitus with hyperglycemia: Secondary | ICD-10-CM | POA: Diagnosis not present

## 2022-01-12 DIAGNOSIS — Z794 Long term (current) use of insulin: Secondary | ICD-10-CM | POA: Diagnosis not present

## 2022-01-12 MED ORDER — METFORMIN HCL ER (OSM) 1000 MG PO TB24: 2000.0000 mg | ORAL_TABLET | Freq: Every day | ORAL | 2 refills | Status: DC

## 2022-01-12 MED ORDER — METFORMIN HCL ER 500 MG PO TB24: 1000.0000 mg | ORAL_TABLET | Freq: Two times a day (BID) | ORAL | 5 refills | Status: DC

## 2022-01-12 NOTE — Patient Instructions (Addendum)
It was nice meeting you today ? ?I would like you to keep a log of your blood pressure and blood sugar for 2 weeks ? ?Please try to take your blood pressure and diabetes medications.  You can write them on the log if you take them or not ? ?Please call or message me if your metformin is too expensive ? ?I will see you back in 2 weeks to discuss ? ?Karren Cobble, PharmD, BCACP, [place], CPP ?[address], [address] ?[place], [place], [address] ?Phone: [phone], Fax: [phone]  ?

## 2022-01-12 NOTE — Progress Notes (Signed)
Patient ID: Carolyn Zimmerman                 DOB: [birth year]                      MRN: [id number]     HPI: Carolyn Zimmerman is a 36 y.o. female referred by Dr. Harriet Masson to PharmD clinic. PMH is significant for HTN, uncontrolled T2DM, HLD, and noncompliance.  Has been seen twice in last month by Dr Harriet Masson for HTN and was sent to ER on 12/01/21.  Patient presents today and voices non compliance with regimen but is trying to get on the right track.  Has not been routinely taking HTN or DM medications.  Was unable to afford Entresto. Does not take her metformin because it upsets her stomach. Became tearful in room.  Patient may have undiagnosed sleep apnea and is scheduled for sleep study but reports she would rather complete an at home sleep study because of her daughter.  Has not been checking her blood pressure or blood sugar at home.  Smokes about 10 cigarettes a day.  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  Current HTN meds:  Valsartan 320mg  daily HCTZ 25mg  daily Amlodipine 10mg  daily Carvedilol 25mg  BID Furosemide 40mg  daily Spironolactone 25mg  daily  BP goal: <130/80  Wt Readings from Last 3 Encounters:  01/12/22 278 lb (126.1 kg)  12/29/21 276 lb (125.2 kg)  12/08/21 277 lb 12.8 oz (126 kg)   BP Readings from Last 3 Encounters:  01/12/22 130/90  12/29/21 (!) 142/92  12/08/21 (!) 166/103   Pulse Readings from Last 3 Encounters:  01/12/22 74  12/29/21 92  12/08/21 74    Renal function: Estimated Creatinine Clearance: 135.7 mL/min (by C-G formula based on SCr of 0.82 mg/dL).  Past Medical History:  Diagnosis Date   Abscess of chest wall 06/15/2017   Coronavirus infection 05/02/2020   Diabetes mellitus    Hypertension    Jaundice    Obesity    TOA (tubo-ovarian abscess) 09/2019    Current Outpatient Medications on File Prior to Visit  Medication Sig Dispense Refill   acetaminophen (TYLENOL) 500 MG tablet Take 2 tablets (1,000 mg total)  by mouth every 8 (eight) hours as needed for mild pain. 60 tablet 3   albuterol (VENTOLIN HFA) 108 (90 Base) MCG/ACT inhaler Inhale 2 puffs into the lungs every 4 (four) hours as needed for wheezing or shortness of breath. 18 each 11   amLODipine (NORVASC) 10 MG tablet Take 1 tablet (10 mg total) by mouth daily. 90 tablet 3   B-D ULTRAFINE III SHORT PEN 31G X 8 MM MISC See admin instructions.     blood glucose meter kit and supplies Dispense based on patient and insurance preference. Use up to four times daily as directed. (FOR ICD-9 250.00, 250.01). 1 each 0   carvedilol (COREG) 25 MG tablet Take 1 tablet (25 mg total) by mouth 2 (two) times daily. 180 tablet 3   furosemide (LASIX) 40 MG tablet Take 1 tablet (40 mg total) by mouth daily. 90 tablet 3   hydrochlorothiazide (HYDRODIURIL) 25 MG tablet Take 25 mg by mouth daily.     insulin aspart protamine- aspart (NOVOLOG MIX 70/30) (70-30) 100 UNIT/ML injection Inject 0.6 mLs (60 Units total) into the skin 2 (two) times daily with a meal. 36 mL 11   Insulin Syringe-Needle U-100 (INSULIN SYRINGE .3CC/31GX5/16") 31G X 5/16" 0.3 ML MISC  Use with insulin, twice daily 100 each 3   rosuvastatin (CRESTOR) 10 MG tablet Take 1 tablet (10 mg total) by mouth daily. 90 tablet 3   spironolactone (ALDACTONE) 25 MG tablet Take 1 tablet (25 mg total) by mouth daily. 90 tablet 3   valsartan (DIOVAN) 320 MG tablet Take 1 tablet (320 mg total) by mouth daily. 90 tablet 3   No current facility-administered medications on file prior to visit.    Allergies  Allergen Reactions   Contrast Media [Iodinated Contrast Media] Hives and Swelling     Assessment/Plan:  1. Hypertension -  Patient BP in room 130/90 which is improved but still above goal of <130/80.  Patient is stressed regarding her health status and frequent medical visits. Advised that self monitoring will be helpful and reducing blood sugar will also make her feel better.  Advised we could try extended  release metformin as it may be more gentle on her stomach and patient is receptive.    Patient will need close follow up. Has appointment with Dr Harriet Masson next week. Recommended she begin checking her blood pressure and blood sugar. Printed log for her to record readings on and will recheck in 2 weeks.  Will send message to sleep coordinator to see if patient can complete sleep study at home.  Begin checking blood pressure and blood sugar daily Change metformin to extended release $RemoveBeforeDE'200mg'[id number]$  a day Recheck in 2 weeks  Karren Cobble, PharmD, [place], [place], [place], [place], [place], [address] Phone: [phone], Fax: [phone]

## 2022-01-17 ENCOUNTER — Ambulatory Visit (INDEPENDENT_AMBULATORY_CARE_PROVIDER_SITE_OTHER): Payer: PRIVATE HEALTH INSURANCE | Admitting: Cardiology

## 2022-01-17 ENCOUNTER — Encounter: Payer: Self-pay | Admitting: Cardiology

## 2022-01-17 ENCOUNTER — Telehealth: Payer: Self-pay

## 2022-01-17 VITALS — BP 170/102 | HR 80 | Ht 67.5 in | Wt 279.0 lb

## 2022-01-17 DIAGNOSIS — Z0000 Encounter for general adult medical examination without abnormal findings: Secondary | ICD-10-CM

## 2022-01-17 DIAGNOSIS — I119 Hypertensive heart disease without heart failure: Secondary | ICD-10-CM | POA: Diagnosis not present

## 2022-01-17 DIAGNOSIS — E1165 Type 2 diabetes mellitus with hyperglycemia: Secondary | ICD-10-CM

## 2022-01-17 DIAGNOSIS — E782 Mixed hyperlipidemia: Secondary | ICD-10-CM | POA: Insufficient documentation

## 2022-01-17 DIAGNOSIS — Z794 Long term (current) use of insulin: Secondary | ICD-10-CM

## 2022-01-17 DIAGNOSIS — E119 Type 2 diabetes mellitus without complications: Secondary | ICD-10-CM | POA: Diagnosis not present

## 2022-01-17 DIAGNOSIS — F17200 Nicotine dependence, unspecified, uncomplicated: Secondary | ICD-10-CM

## 2022-01-17 MED ORDER — METFORMIN HCL ER 500 MG PO TB24: 1000.0000 mg | ORAL_TABLET | Freq: Two times a day (BID) | ORAL | 5 refills | Status: DC

## 2022-01-17 MED ORDER — AMLODIPINE-VALSARTAN-HCTZ 10-320-25 MG PO TABS: 1.0000 | ORAL_TABLET | Freq: Every day | ORAL | 3 refills | Status: DC

## 2022-01-17 NOTE — Patient Instructions (Addendum)
Medication Instructions:  ?Your physician has recommended you make the following change in your medication:  ?START: Metformin ER 1000 mg (two 500 mg tablets) twice daily ?START: Exforge HCT (Amlodipine 10-Hydrochlorothiazide 25- Valsartan 320) once daily ?Please hold your Lasix for now, unless 3 pound weight gain in 1 day or 5 pound weight gain in 7 days.  ? ?*If you need a refill on your cardiac medications before your next appointment, please call your pharmacy* ? ? ?Lab Work: ?Your physician recommends that you return for lab work in:  ?TODAY: HbgA1c ?If you have labs (blood work) drawn today and your tests are completely normal, you will receive your results only by: ?MyChart Message (if you have MyChart) OR ?A paper copy in the mail ?If you have any lab test that is abnormal or we need to change your treatment, we will call you to review the results. ? ? ?Testing/Procedures: ?None ? ? ?Follow-Up: ?At [facility], you and your health needs are our priority.  As part of our continuing mission to provide you with exceptional heart care, we have created designated Provider Care Teams.  These Care Teams include your primary Cardiologist (physician) and Advanced Practice Providers (APPs -  Physician Assistants and Nurse Practitioners) who all work together to provide you with the care you need, when you need it. ? ?We recommend signing up for the patient portal called "MyChart".  Sign up information is provided on this After Visit Summary.  MyChart is used to connect with patients for Virtual Visits (Telemedicine).  Patients are able to view lab/test results, encounter notes, upcoming appointments, etc.  Non-urgent messages can be sent to your provider as well.   ?To learn more about what you can do with MyChart, go to [email or web address].   ? ?Your next appointment:   ?3 month(s) ? ?The format for your next appointment:   ?In Person ? ?Provider:   ?Thomasene Ripple, DO   ? ? ?Other Instructions ? ? ?Important  Information About Sugar ? ? ? ? ?  ?

## 2022-01-17 NOTE — Progress Notes (Signed)
?Cardiology Office Note:   ? ?Date:  01/17/2022  ? ?ID:  Carolyn Zimmerman, DOB 12-11-1985, MRN [id number] ? ?PCP:  Vevelyn Francois, NP  ?Cardiologist:  Berniece Salines, DO  ?Electrophysiologist:  None  ? ?Referring MD: Vevelyn Francois, NP  ? ? ? ?History of Present Illness:   ? ?Carolyn Zimmerman is a 36 y.o. female with a hx of hypertension, diabetes mellitus, here today for follow-up visit. ? ?I initially saw the patient on September 23, 2020 at that time she presented because she was experiencing shortness of breath.  During her visit she was significantly hypertensive.  We stopped her lisinopril and started her on valsartan 40 mg daily.  Continue hydrochlorothiazide and amlodipine.  I increased her Lasix because she did have bilateral leg edema to 40 mg twice daily that day for 5 days.  We also discussed her diabetes mellitus given the fact that she was significantly uncontrolled. ?  ?  ?At her  visit on October 06, 2021 we discussed in detail that her new diagnosis with the EF of 45%.  I attempted to put the patient on Entresto 24 to 25 mg twice a day but unfortunately after appealing this medication he still was not covered by the insurance company.  We will therefore transition her back to valsartan, and added carvedilol.  The patient on the Lasix and held her amlodipine due to the medication changes. ?  ?At her visit on November 25, 2021 her blood pressure was elevated I wanted the patient to go to the emergency department but she had declined.  Therefore I had no choice but kept her on valsartan 320 mg daily, increase her carvedilol to 25 mg twice a day, started the patient on Aldactone 25 mg daily and refill her Lasix.  ?  ?Her regimen was then: Valsartan 320 mg daily, Coreg 25 mg twice daily, Aldactone 25 mg daily and Lasix 40 mg daily: Ultimately I really wanted the patient to go to the emergency department but she was not willing to at that time. ?  ?At her visit  on 12/01/2021 she was hypertensive I sent the patient to  emergency department.  While in emergency department she was giving clonidine.  And was discharged home.  She has been taking the clonidine.  She is here today for follow-up visit.   ?  ?At her visit on December 08, 2021 I kept the patient on her valsartan 320 mg daily, carvedilol 25 mg a day, clonidine 0.1 mg twice a day and Aldactone.  We added amlodipine.  I also placed work-up for the patient for secondary hypertension. ? ?I saw the patient on December 29, 2021 at that time she was on the clonidine which she thought had been causing her to be significantly weak.  We stopped the clonidine at the and started her on hydralazine.  We continued the valsartan 320 mg daily, amlodipine 10 mg daily, carvedilol 25 mg daily, Aldactone 25 mg daily.  Hydralazine had not been started. ? ?She did see our clinical pharmacist since I last saw her.  We talked about her blood pressure and diabetes management. ? ? ?Past Medical History:  ?Diagnosis Date  ? Abscess of chest wall 06/15/2017  ? Coronavirus infection 05/02/2020  ? Diabetes mellitus   ? Hypertension   ? Jaundice   ? Obesity   ? TOA (tubo-ovarian abscess) 09/2019  ? ? ?Past Surgical History:  ?Procedure Laterality Date  ? CHOLECYSTECTOMY    ? IRRIGATION AND DEBRIDEMENT ABSCESS  N/A 05/02/2020  ? Procedure: IRRIGATION AND DEBRIDEMENT ABDOMINAL and CHEST WALL NECROTIZING FASCITITS;  Surgeon: Michael Boston, MD;  Location: [facility] ORS;  Service: General;  Laterality: N/A;  ? liver stent    ? ? ?Current Medications: ?Current Meds  ?Medication Sig  ? acetaminophen (TYLENOL) 500 MG tablet Take 2 tablets (1,000 mg total) by mouth every 8 (eight) hours as needed for mild pain.  ? albuterol (VENTOLIN HFA) 108 (90 Base) MCG/ACT inhaler Inhale 2 puffs into the lungs every 4 (four) hours as needed for wheezing or shortness of breath.  ? amLODIPine-Valsartan-HCTZ 10-320-25 MG TABS Take 1 tablet by mouth daily.  ? B-D ULTRAFINE III SHORT PEN 31G X 8 MM MISC See admin instructions.  ? blood glucose  meter kit and supplies Dispense based on patient and insurance preference. Use up to four times daily as directed. (FOR ICD-9 250.00, 250.01).  ? carvedilol (COREG) 25 MG tablet Take 1 tablet (25 mg total) by mouth 2 (two) times daily.  ? furosemide (LASIX) 40 MG tablet Take 1 tablet (40 mg total) by mouth daily.  ? insulin aspart protamine- aspart (NOVOLOG MIX 70/30) (70-30) 100 UNIT/ML injection Inject 0.6 mLs (60 Units total) into the skin 2 (two) times daily with a meal.  ? Insulin Syringe-Needle U-100 (INSULIN SYRINGE .3CC/31GX5/16") 31G X 5/16" 0.3 ML MISC Use with insulin, twice daily  ? metFORMIN (GLUCOPHAGE-XR) 500 MG 24 hr tablet Take 2 tablets (1,000 mg total) by mouth in the morning and at bedtime.  ? rosuvastatin (CRESTOR) 10 MG tablet Take 1 tablet (10 mg total) by mouth daily.  ? spironolactone (ALDACTONE) 25 MG tablet Take 1 tablet (25 mg total) by mouth daily.  ? [DISCONTINUED] amLODipine (NORVASC) 10 MG tablet Take 1 tablet (10 mg total) by mouth daily.  ? [DISCONTINUED] hydrochlorothiazide (HYDRODIURIL) 25 MG tablet Take 25 mg by mouth daily.  ? [DISCONTINUED] metFORMIN (GLUCOPHAGE-XR) 500 MG 24 hr tablet Take 2 tablets (1,000 mg total) by mouth in the morning and at bedtime.  ? [DISCONTINUED] valsartan (DIOVAN) 320 MG tablet Take 1 tablet (320 mg total) by mouth daily.  ?  ? ?Allergies:   Contrast media [iodinated contrast media]  ? ?Social History  ? ?Socioeconomic History  ? Marital status: Single  ?  Spouse name: Not on file  ? Number of children: Not on file  ? Years of education: Not on file  ? Highest education level: Not on file  ?Occupational History  ? Not on file  ?Tobacco Use  ? Smoking status: Former  ?  Packs/day: 0.50  ?  Types: Cigarettes  ? Smokeless tobacco: Never  ?Vaping Use  ? Vaping Use: Former  ?Substance and Sexual Activity  ? Alcohol use: Not Currently  ?  Comment: occ  ? Drug use: No  ? Sexual activity: Not on file  ?Other Topics Concern  ? Not on file  ?Social History  Narrative  ? Not on file  ? ?Social Determinants of Health  ? ?Financial Resource Strain: Not on file  ?Food Insecurity: Not on file  ?Transportation Needs: Not on file  ?Physical Activity: Not on file  ?Stress: Not on file  ?Social Connections: Not on file  ?  ? ?Family History: ?The patient's family history includes Diabetes in her father; Hypertension in her father and mother. ? ?ROS:   ?Review of Systems  ?Constitution: Negative for decreased appetite, fever and weight gain.  ?HENT: Negative for congestion, ear discharge, hoarse voice and sore throat.   ?  Eyes: Negative for discharge, redness, vision loss in right eye and visual halos.  ?Cardiovascular: Negative for chest pain, dyspnea on exertion, leg swelling, orthopnea and palpitations.  ?Respiratory: Negative for cough, hemoptysis, shortness of breath and snoring.   ?Endocrine: Negative for heat intolerance and polyphagia.  ?Hematologic/Lymphatic: Negative for bleeding problem. Does not bruise/bleed easily.  ?Skin: Negative for flushing, nail changes, rash and suspicious lesions.  ?Musculoskeletal: Negative for arthritis, joint pain, muscle cramps, myalgias, neck pain and stiffness.  ?Gastrointestinal: Negative for abdominal pain, bowel incontinence, diarrhea and excessive appetite.  ?Genitourinary: Negative for decreased libido, genital sores and incomplete emptying.  ?Neurological: Negative for brief paralysis, focal weakness, headaches and loss of balance.  ?Psychiatric/Behavioral: Negative for altered mental status, depression and suicidal ideas.  ?Allergic/Immunologic: Negative for HIV exposure and persistent infections.  ? ? ?EKGs/Labs/Other Studies Reviewed:   ? ?The following studies were reviewed today: ? ? ?EKG:  The ekg ordered today demonstrates  ? ?Transthoracic echocardiogram September 29, 2021 ?IMPRESSIONS  ? ? ? 1. Left ventricular ejection fraction by 3D volume is 45 %. The left  ?ventricle has moderately decreased function. The left ventricle   ?demonstrates global hypokinesis. There is moderate left ventricular  ?hypertrophy. Left ventricular diastolic parameters  ?are indeterminate. The average left ventricular global longitudinal strain  ?is -7.

## 2022-01-17 NOTE — Telephone Encounter (Signed)
Called patient per referral from Dr. Servando Salina regarding smoking cessation. Patient is interested in quitting smoking. Patient has been scheduled for her initial health coaching session over the phone at 1:30pm on May 4th. Patient will be called at this time. Patient was emailed a copy of the [employer], Code of Ethics, and the [employer].  ? ?Renaee Munda, CHWC ?[facility] ?Care Guide, Health Coach ?[address] ?[place] [address] ?Telephone: [phone] ?Email: [email or web address] ? ?

## 2022-01-18 LAB — HEMOGLOBIN A1C
Est. average glucose Bld gHb Est-mCnc: 292 mg/dL
Hgb A1c MFr Bld: 11.8 % — ABNORMAL HIGH (ref 4.8–5.6)

## 2022-01-19 ENCOUNTER — Ambulatory Visit (HOSPITAL_BASED_OUTPATIENT_CLINIC_OR_DEPARTMENT_OTHER)
Admission: RE | Admit: 2022-01-19 | Discharge: 2022-01-19 | Disposition: A | Discharge status: Home / Self Care | Payer: PRIVATE HEALTH INSURANCE | Source: Ambulatory Visit | Attending: Cardiology | Admitting: Cardiology

## 2022-01-19 ENCOUNTER — Ambulatory Visit: Payer: PRIVATE HEALTH INSURANCE

## 2022-01-19 ENCOUNTER — Ambulatory Visit (HOSPITAL_COMMUNITY)
Admission: RE | Admit: 2022-01-19 | Discharge: 2022-01-19 | Disposition: A | Discharge status: Home / Self Care | Payer: PRIVATE HEALTH INSURANCE | Source: Ambulatory Visit | Attending: Cardiovascular Disease | Admitting: Cardiovascular Disease

## 2022-01-19 DIAGNOSIS — I10 Essential (primary) hypertension: Secondary | ICD-10-CM | POA: Diagnosis not present

## 2022-01-19 NOTE — Progress Notes (Signed)
Patient sent email indicating that she had an emergency regarding her daughter and needed to reschedule her appointment for today at 1:30pm. Responded to patient via email that the appointment can be rescheduled at her earliest convenience. Patient responded that she will call when she has the issue resolved. Patient's appointment for today has been cancelled.  ? ? ?Renaee Munda, CHWC ?[facility] ?Care Guide, Health Coach ?[address] ?[place] [address] ?Telephone: [phone] ?Email: [email or web address] ? ?

## 2022-01-22 ENCOUNTER — Ambulatory Visit (HOSPITAL_BASED_OUTPATIENT_CLINIC_OR_DEPARTMENT_OTHER): Payer: PRIVATE HEALTH INSURANCE | Admitting: Cardiovascular Disease

## 2022-01-24 ENCOUNTER — Encounter: Payer: Self-pay | Admitting: Pharmacist

## 2022-01-25 ENCOUNTER — Encounter: Payer: Self-pay | Admitting: Cardiology

## 2022-01-25 NOTE — Telephone Encounter (Signed)
Error

## 2022-01-26 ENCOUNTER — Ambulatory Visit: Payer: PRIVATE HEALTH INSURANCE

## 2022-01-26 NOTE — Progress Notes (Deleted)
Patient ID: Carolyn Zimmerman                 DOB: 04/14/1986                      MRN: [id number]     HPI: Carolyn Zimmerman is a 35 y.o. female referred by Dr. Tobb to PharmD clinic. PMH is significant for HTN, uncontrolled T2DM, HLD, and noncompliance.  Has been seen twice in last month by Dr Tobb for HTN and was sent to ER on 12/01/21.  Patient presents today and voices non compliance with regimen but is trying to get on the right track.  Has not been routinely taking HTN or DM medications.  Was unable to afford Entresto. Does not take her metformin because it upsets her stomach. Became tearful in room.  Patient may have undiagnosed sleep apnea and is scheduled for sleep study but reports she would rather complete an at home sleep study because of her daughter.  Has not been checking her blood pressure or blood sugar at home.  Smokes about 10 cigarettes a day.  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  Current HTN meds:  Valsartan 320mg daily HCTZ 25mg daily Amlodipine 10mg daily Carvedilol 25mg BID Furosemide 40mg daily Spironolactone 25mg daily  BP goal: <130/80  Wt Readings from Last 3 Encounters:  01/17/22 279 lb (126.6 kg)  01/12/22 278 lb (126.1 kg)  12/29/21 276 lb (125.2 kg)   BP Readings from Last 3 Encounters:  01/17/22 (!) 170/102  01/12/22 130/90  12/29/21 (!) 142/92   Pulse Readings from Last 3 Encounters:  01/17/22 80  01/12/22 74  12/29/21 92    Renal function: CrCl cannot be calculated (Patient's most recent lab result is older than the maximum 21 days allowed.).  Past Medical History:  Diagnosis Date   Abscess of chest wall 06/15/2017   Coronavirus infection 05/02/2020   Diabetes mellitus    Hypertension    Jaundice    Obesity    TOA (tubo-ovarian abscess) 09/2019    Current Outpatient Medications on File Prior to Visit  Medication Sig Dispense Refill   acetaminophen (TYLENOL) 500 MG tablet Take 2 tablets (1,000  mg total) by mouth every 8 (eight) hours as needed for mild pain. 60 tablet 3   albuterol (VENTOLIN HFA) 108 (90 Base) MCG/ACT inhaler Inhale 2 puffs into the lungs every 4 (four) hours as needed for wheezing or shortness of breath. 18 each 11   amLODIPine-Valsartan-HCTZ 10-320-25 MG TABS Take 1 tablet by mouth daily. 90 tablet 3   B-D ULTRAFINE III SHORT PEN 31G X 8 MM MISC See admin instructions.     blood glucose meter kit and supplies Dispense based on patient and insurance preference. Use up to four times daily as directed. (FOR ICD-9 250.00, 250.01). 1 each 0   carvedilol (COREG) 25 MG tablet Take 1 tablet (25 mg total) by mouth 2 (two) times daily. 180 tablet 3   furosemide (LASIX) 40 MG tablet Take 1 tablet (40 mg total) by mouth daily. 90 tablet 3   insulin aspart protamine- aspart (NOVOLOG MIX 70/30) (70-30) 100 UNIT/ML injection Inject 0.6 mLs (60 Units total) into the skin 2 (two) times daily with a meal. 36 mL 11   Insulin Syringe-Needle U-100 (INSULIN SYRINGE .3CC/31GX5/16") 31G X 5/16" 0.3 ML MISC Use with insulin, twice daily 100 each 3   metFORMIN (GLUCOPHAGE-XR) 500 MG 24 hr tablet Take   2 tablets (1,000 mg total) by mouth in the morning and at bedtime. 120 tablet 5   rosuvastatin (CRESTOR) 10 MG tablet Take 1 tablet (10 mg total) by mouth daily. 90 tablet 3   spironolactone (ALDACTONE) 25 MG tablet Take 1 tablet (25 mg total) by mouth daily. 90 tablet 3   No current facility-administered medications on file prior to visit.    Allergies  Allergen Reactions   Contrast Media [Iodinated Contrast Media] Hives and Swelling     Assessment/Plan:  1. Hypertension -  Patient BP in room 130/90 which is improved but still above goal of <130/80.  Patient is stressed regarding her health status and frequent medical visits. Advised that self monitoring will be helpful and reducing blood sugar will also make her feel better.  Advised we could try extended release metformin as it may be more  gentle on her stomach and patient is receptive.    Patient will need close follow up. Has appointment with Dr Harriet Masson next week. Recommended she begin checking her blood pressure and blood sugar. Printed log for her to record readings on and will recheck in 2 weeks.  Will send message to sleep coordinator to see if patient can complete sleep study at home.  Begin checking blood pressure and blood sugar daily Change metformin to extended release 266m a day Recheck in 2 weeks  CKarren Cobble PharmD, [place] [address]Phone: [phone] Fax: [phone]

## 2022-02-22 ENCOUNTER — Telehealth (HOSPITAL_COMMUNITY): Payer: PRIVATE HEALTH INSURANCE | Admitting: Psychiatry

## 2022-02-26 ENCOUNTER — Encounter (HOSPITAL_BASED_OUTPATIENT_CLINIC_OR_DEPARTMENT_OTHER): Payer: PRIVATE HEALTH INSURANCE | Admitting: Cardiovascular Disease

## 2022-03-27 ENCOUNTER — Encounter: Payer: Self-pay | Admitting: Cardiology

## 2022-03-28 ENCOUNTER — Ambulatory Visit (HOSPITAL_BASED_OUTPATIENT_CLINIC_OR_DEPARTMENT_OTHER): Payer: PRIVATE HEALTH INSURANCE | Attending: Cardiology | Admitting: Cardiology

## 2022-03-28 DIAGNOSIS — R0683 Snoring: Secondary | ICD-10-CM | POA: Insufficient documentation

## 2022-03-28 DIAGNOSIS — R5383 Other fatigue: Secondary | ICD-10-CM | POA: Diagnosis not present

## 2022-03-28 DIAGNOSIS — R400 Somnolence: Secondary | ICD-10-CM | POA: Insufficient documentation

## 2022-03-30 NOTE — Procedures (Signed)
   Patient Name: Carolyn, Zimmerman Date: 03/28/2022 Gender: Female D.O.B: June 30, 1986 Age (years): 35 Referring Provider: Lavona Mound Tobb DO Height (inches): 67 Interpreting Physician: Armanda Magic MD, ABSM Weight (lbs): 281 RPSGT: Ulyess Mort BMI: 44 MRN: [id number] Neck Size: 16.50  CLINICAL INFORMATION Sleep Study Type: NPSG  Indication for sleep study: Diabetes, Hypertension, Morbid Obesity, Obesity, Snoring  Epworth Sleepiness Score: 14  SLEEP STUDY TECHNIQUE As per the AASM Manual for the Scoring of Sleep and Associated Events v2.3 (April 2016) with a hypopnea requiring 4% desaturations.  The channels recorded and monitored were frontal, central and occipital EEG, electrooculogram (EOG), submentalis EMG (chin), nasal and oral airflow, thoracic and abdominal wall motion, anterior tibialis EMG, snore microphone, electrocardiogram, and pulse oximetry.  MEDICATIONS Medications self-administered by patient taken the night of the study : N/A  SLEEP ARCHITECTURE The study was initiated at 10:17:31 PM and ended at 4:43:01 AM.  Sleep onset time was 29.4 minutes and the sleep efficiency was 81.6%. The total sleep time was 314.5 minutes.  Stage REM latency was 81.0 minutes.  The patient spent 15.9% of the night in stage N1 sleep, 64.9% in stage N2 sleep, 0.0% in stage N3 and 19.2% in REM.  Alpha intrusion was absent.  Supine sleep was 0.00%.  RESPIRATORY PARAMETERS The overall apnea/hypopnea index (AHI) was 0.4 per hour. There were 0 total apneas, including 0 obstructive, 0 central and 0 mixed apneas. There were 2 hypopneas and 34 RERAs.  The AHI during Stage REM sleep was 2.0 per hour.  AHI while supine was N/A per hour.  The mean oxygen saturation was 97.7%. The minimum SpO2 during sleep was 93.0%.  soft snoring was noted during this study.  CARDIAC DATA The 2 lead EKG demonstrated sinus rhythm. The mean heart rate was 69.1 beats per minute. Other EKG findings  include: None.  LEG MOVEMENT DATA The total PLMS were 0 with a resulting PLMS index of 0.0. Associated arousal with leg movement index was 0.4 .  IMPRESSIONS - No significant obstructive sleep apnea occurred during this study (AHI = 0.4/h). - The patient had minimal or no oxygen desaturation during the study (Min O2 = 93.0%) - The patient snored with soft snoring volume. - No cardiac abnormalities were noted during this study. - Clinically significant periodic limb movements did not occur during sleep. No significant associated arousals.  DIAGNOSIS - Normal Study  RECOMMENDATIONS - Avoid alcohol, sedatives and other CNS depressants that may worsen sleep apnea and disrupt normal sleep architecture. - Sleep hygiene should be reviewed to assess factors that may improve sleep quality. - Weight management and regular exercise should be initiated or continued if appropriate.  [Electronically signed] 03/30/2022 12:58 PM  Armanda Magic MD, ABSM Diplomate, American Board of Sleep Medicine

## 2022-04-18 ENCOUNTER — Telehealth: Payer: Self-pay | Admitting: *Deleted

## 2022-04-18 NOTE — Telephone Encounter (Signed)
The patient has been notified of the result. Left detailed message on voicemail and informed patient to call back. Latrelle Dodrill, CMA 04/18/2022 5:48 PM

## 2022-04-18 NOTE — Telephone Encounter (Signed)
-----   Message from Gaynelle Cage, CMA sent at 04/18/2022  8:44 AM EDT -----  ----- Message ----- From: Quintella Reichert, MD Sent: 03/30/2022  12:59 PM EDT To: [facility] Sleep Studies  Please let patient know that sleep study showed no significant sleep apnea.

## 2022-04-20 ENCOUNTER — Encounter: Payer: Self-pay | Admitting: Cardiology

## 2022-04-20 ENCOUNTER — Telehealth: Payer: Self-pay

## 2022-04-20 ENCOUNTER — Ambulatory Visit (INDEPENDENT_AMBULATORY_CARE_PROVIDER_SITE_OTHER): Payer: PRIVATE HEALTH INSURANCE | Admitting: Cardiology

## 2022-04-20 VITALS — BP 160/100 | HR 80 | Ht 67.0 in | Wt 289.0 lb

## 2022-04-20 DIAGNOSIS — F419 Anxiety disorder, unspecified: Secondary | ICD-10-CM

## 2022-04-20 DIAGNOSIS — Z794 Long term (current) use of insulin: Secondary | ICD-10-CM

## 2022-04-20 DIAGNOSIS — E782 Mixed hyperlipidemia: Secondary | ICD-10-CM

## 2022-04-20 DIAGNOSIS — Z0000 Encounter for general adult medical examination without abnormal findings: Secondary | ICD-10-CM

## 2022-04-20 DIAGNOSIS — I10 Essential (primary) hypertension: Secondary | ICD-10-CM | POA: Diagnosis not present

## 2022-04-20 DIAGNOSIS — F32A Depression, unspecified: Secondary | ICD-10-CM

## 2022-04-20 DIAGNOSIS — E1165 Type 2 diabetes mellitus with hyperglycemia: Secondary | ICD-10-CM

## 2022-04-20 DIAGNOSIS — F17200 Nicotine dependence, unspecified, uncomplicated: Secondary | ICD-10-CM | POA: Diagnosis not present

## 2022-04-20 DIAGNOSIS — Z7689 Persons encountering health services in other specified circumstances: Secondary | ICD-10-CM

## 2022-04-20 MED ORDER — CLONIDINE HCL 0.1 MG PO TABS: 0.2000 mg | ORAL_TABLET | Freq: Once | ORAL | Status: DC

## 2022-04-20 MED ORDER — SPIRONOLACTONE 25 MG PO TABS: 25.0000 mg | ORAL_TABLET | Freq: Every day | ORAL | 3 refills | Status: DC

## 2022-04-20 MED ORDER — CLONIDINE HCL 0.2 MG PO TABS: 0.2000 mg | ORAL_TABLET | Freq: Every evening | ORAL | 3 refills | Status: DC

## 2022-04-20 NOTE — Patient Instructions (Addendum)
Medication Instructions:  Your physician has recommended you make the following change in your medication:  START: Clonidine 0.2 mg nightly *If you need a refill on your cardiac medications before your next appointment, please call your pharmacy*   Lab Work: None If you have labs (blood work) drawn today and your tests are completely normal, you will receive your results only by: MyChart Message (if you have MyChart) OR A paper copy in the mail If you have any lab test that is abnormal or we need to change your treatment, we will call you to review the results.   Testing/Procedures: None   Follow-Up: At [facility], you and your health needs are our priority.  As part of our continuing mission to provide you with exceptional heart care, we have created designated Provider Care Teams.  These Care Teams include your primary Cardiologist (physician) and Advanced Practice Providers (APPs -  Physician Assistants and Nurse Practitioners) who all work together to provide you with the care you need, when you need it.  We recommend signing up for the patient portal called "MyChart".  Sign up information is provided on this After Visit Summary.  MyChart is used to connect with patients for Virtual Visits (Telemedicine).  Patients are able to view lab/test results, encounter notes, upcoming appointments, etc.  Non-urgent messages can be sent to your provider as well.   To learn more about what you can do with MyChart, go to [email or web address].    Your next appointment:   6 month(s)  The format for your next appointment:   In Person  Provider:   Thomasene Ripple, DO     Other Instructions   Important Information About Sugar

## 2022-04-20 NOTE — Telephone Encounter (Signed)
Return call: Pt is aware and agreeable to normal results. 

## 2022-04-20 NOTE — Telephone Encounter (Signed)
Called patient per referral from Dr. Servando Salina for health coaching regarding smoking cessation. Patient is interested in quitting smoking and mentioned concerns regarding stress. Patient has been scheduled as requested for her initial session in-person on 8/10 at 1:00pm.  Renaee Munda, [facility] Guide, Health Coach [address], [address] [place] [address] Telephone: [phone] Email: [email or web address]

## 2022-04-22 ENCOUNTER — Encounter: Payer: Self-pay | Admitting: Cardiology

## 2022-04-22 DIAGNOSIS — F419 Anxiety disorder, unspecified: Secondary | ICD-10-CM | POA: Insufficient documentation

## 2022-04-22 DIAGNOSIS — F17200 Nicotine dependence, unspecified, uncomplicated: Secondary | ICD-10-CM | POA: Insufficient documentation

## 2022-04-22 DIAGNOSIS — F32A Depression, unspecified: Secondary | ICD-10-CM | POA: Insufficient documentation

## 2022-04-22 DIAGNOSIS — Z7689 Persons encountering health services in other specified circumstances: Secondary | ICD-10-CM | POA: Insufficient documentation

## 2022-04-22 DIAGNOSIS — Z0000 Encounter for general adult medical examination without abnormal findings: Secondary | ICD-10-CM | POA: Insufficient documentation
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
## 2022-04-27 ENCOUNTER — Ambulatory Visit (INDEPENDENT_AMBULATORY_CARE_PROVIDER_SITE_OTHER): Payer: PRIVATE HEALTH INSURANCE

## 2022-04-27 DIAGNOSIS — Z0000 Encounter for general adult medical examination without abnormal findings: Secondary | ICD-10-CM
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
## 2022-04-28 ENCOUNTER — Telehealth: Payer: Self-pay | Admitting: Licensed Clinical Social Worker

## 2022-04-28 NOTE — Telephone Encounter (Signed)
LCSW reached out to pt this morning at request of Health Coach. Was able to briefly connect with pt at [phone]. Introduced self, role, reason for call. Pt currently at work, she should be off at 2pm and will give me a call back at that time.   Octavio Graves, MSW, LCSW Clinical Social Worker II [facility] Navigation  [phone]- work cell phone (preferred) [phone]- desk phone
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
## 2022-05-04 ENCOUNTER — Ambulatory Visit: Payer: PRIVATE HEALTH INSURANCE | Admitting: Internal Medicine

## 2022-05-04 NOTE — Progress Notes (Deleted)
Name: Carolyn Zimmerman  MRN/ DOB: [id number], 04-Feb-1986   Age/ Sex: 36 y.o., female    PCP: Berniece Salines, DO   Reason for Endocrinology Evaluation: Type {NUMBERS 1 OR 2:522190} Diabetes Mellitus     Date of Initial Endocrinology Visit: 05/04/2022     PATIENT IDENTIFIER: Carolyn Zimmerman is a 36 y.o. female with a past medical history of DM, HTN, and dyslipidemia. The patient presented for initial endocrinology clinic visit on 05/04/2022 for consultative assistance with her diabetes management.    HPI: Carolyn Zimmerman was    Diagnosed with DM 2018 Prior Medications tried/Intolerance: *** Currently checking blood sugars *** x / day,  before breakfast and ***.  Hypoglycemia episodes : ***               Symptoms: ***                 Frequency: ***/  Hemoglobin A1c has ranged from 9.4% in 09/2019, peaking at 12.2% in 07/2020. Patient required assistance for hypoglycemia:  Patient has required hospitalization within the last 1 year from hyper or hypoglycemia: Patient presented with hyperglycemia July 2023  In terms of diet, the patient ***   Patient follows with cardiology for accelerated hypertension, catecholamine testing has been negative, she did have a normal aldosterone and renin but elevated Aldo/renin ratio, patient is on spironolactone Adrenal imaging in 2021 was normal  HOME DIABETES REGIMEN: Metformin 500 mg XR NovoLog Mix   Statin: no ACE-I/ARB: yes   METER DOWNLOAD SUMMARY: Date range evaluated: *** Fingerstick Blood Glucose Tests = *** Average Number Tests/Day = *** Overall Mean FS Glucose = *** Standard Deviation = ***  BG Ranges: Low = *** High = ***   Hypoglycemic Events/30 Days: BG < 50 = *** Episodes of symptomatic severe hypoglycemia = ***   DIABETIC COMPLICATIONS: Microvascular complications:  *** Denies: CKD Last eye exam: Completed   Macrovascular complications:   Denies: CAD, PVD, CVA   PAST HISTORY: Past Medical History:  Past Medical  History:  Diagnosis Date   Abscess of chest wall 06/15/2017   Coronavirus infection 05/02/2020   Diabetes mellitus    Hypertension    Jaundice    Obesity    TOA (tubo-ovarian abscess) 09/2019   Past Surgical History:  Past Surgical History:  Procedure Laterality Date   CHOLECYSTECTOMY     IRRIGATION AND DEBRIDEMENT ABSCESS N/A 05/02/2020   Procedure: IRRIGATION AND DEBRIDEMENT ABDOMINAL and CHEST WALL NECROTIZING FASCITITS;  Surgeon: Michael Boston, MD;  Location: [facility];  Service: General;  Laterality: N/A;   liver stent      Social History:  reports that she has quit smoking. Her smoking use included cigarettes. She smoked an average of .5 packs per day. She has never used smokeless tobacco. She reports that she does not currently use alcohol. She reports that she does not use drugs. Family History:  Family History  Problem Relation Age of Onset   Hypertension Mother    Diabetes Father    Hypertension Father      HOME MEDICATIONS: Allergies as of 05/04/2022       Reactions   Contrast Media [iodinated Contrast Media] Hives, Swelling        Medication List        Accurate as of May 04, 2022  7:23 AM. If you have any questions, ask your nurse or doctor.          acetaminophen 500 MG tablet Commonly known as: TYLENOL Take 2 tablets (  1,000 mg total) by mouth every 8 (eight) hours as needed for mild pain.   albuterol 108 (90 Base) MCG/ACT inhaler Commonly known as: VENTOLIN HFA Inhale 2 puffs into the lungs every 4 (four) hours as needed for wheezing or shortness of breath.   amLODipine 10 MG tablet Commonly known as: NORVASC Take 10 mg by mouth daily.   B-D ULTRAFINE III SHORT PEN 31G X 8 MM Misc Generic drug: Insulin Pen Needle See admin instructions.   blood glucose meter kit and supplies Dispense based on patient and insurance preference. Use up to four times daily as directed. (FOR ICD-9 250.00, 250.01).   carvedilol 25 MG tablet Commonly known as:  COREG Take 1 tablet (25 mg total) by mouth 2 (two) times daily.   cloNIDine 0.2 MG tablet Commonly known as: Catapres Take 1 tablet (0.2 mg total) by mouth at bedtime.   furosemide 40 MG tablet Commonly known as: LASIX Take 1 tablet (40 mg total) by mouth daily.   hydrochlorothiazide 25 MG tablet Commonly known as: HYDRODIURIL Take 25 mg by mouth daily.   insulin aspart protamine- aspart (70-30) 100 UNIT/ML injection Commonly known as: NOVOLOG MIX 70/30 Inject 0.6 mLs (60 Units total) into the skin 2 (two) times daily with a meal.   INSULIN SYRINGE .3CC/31GX5/16" 31G X 5/16" 0.3 ML Misc Use with insulin, twice daily   metFORMIN 500 MG 24 hr tablet Commonly known as: GLUCOPHAGE-XR Take 2 tablets (1,000 mg total) by mouth in the morning and at bedtime.   rosuvastatin 10 MG tablet Commonly known as: CRESTOR Take 1 tablet (10 mg total) by mouth daily.   spironolactone 25 MG tablet Commonly known as: ALDACTONE Take 1 tablet (25 mg total) by mouth daily.   valsartan 320 MG tablet Commonly known as: DIOVAN Take 320 mg by mouth daily.         ALLERGIES: Allergies  Allergen Reactions   Contrast Media [Iodinated Contrast Media] Hives and Swelling     REVIEW OF SYSTEMS: A comprehensive ROS was conducted with the patient and is negative except as per HPI and below:  ROS    OBJECTIVE:   VITAL SIGNS: There were no vitals taken for this visit.   PHYSICAL EXAM:  General: Pt appears well and is in NAD  Hydration: Well-hydrated with moist mucous membranes and good skin turgor  HEENT: Head: Unremarkable with good dentition. Oropharynx clear without exudate.  Eyes: External eye exam normal without stare, lid lag or exophthalmos.  EOM intact.  PERRL.  Neck: General: Supple without adenopathy or carotid bruits. Thyroid: Thyroid size normal.  No goiter or nodules appreciated. No thyroid bruit.  Lungs: Clear with good BS bilat with no rales, rhonchi, or wheezes  Heart: RRR  with normal S1 and S2 and no gallops; no murmurs; no rub  Abdomen: Normoactive bowel sounds, soft, nontender, without masses or organomegaly palpable  Extremities:  Lower extremities - No pretibial edema. No lesions.  Skin: Normal texture and temperature to palpation. No rash noted. No Acanthosis nigricans/skin tags. No lipohypertrophy.  Neuro: MS is good with appropriate affect, pt is alert and Ox3    DM foot exam:    DATA REVIEWED:  Lab Results  Component Value Date   HGBA1C 11.8 (H) 01/17/2022   HGBA1C 10.0 (H) 10/06/2021   HGBA1C 12.2 (A) 08/03/2020   HGBA1C 12.2 08/03/2020   HGBA1C 12.2 (A) 08/03/2020   HGBA1C 12.2 (A) 08/03/2020   Lab Results  Component Value Date   LDLCALC 100 (  H) 12/29/2021   CREATININE 0.82 12/29/2021   No results found for: "MICRALBCREAT"  Lab Results  Component Value Date   CHOL 172 12/29/2021   HDL 34 (L) 12/29/2021   LDLCALC 100 (H) 12/29/2021   TRIG 219 (H) 12/29/2021   CHOLHDL 5.1 (H) 12/29/2021        ASSESSMENT / PLAN / RECOMMENDATIONS:   1) Type *** Diabetes Mellitus, ***controlled, With*** complications - Most recent A1c of *** %. Goal A1c < *** %.  ***  Plan: GENERAL: ***  MEDICATIONS: ***  EDUCATION / INSTRUCTIONS: BG monitoring instructions: Patient is instructed to check her blood sugars *** times a day, ***. Call [facility] if: BG persistently < 70 or > 300. I reviewed the Rule of 15 for the treatment of hypoglycemia in detail with the patient. Literature supplied.   2) Diabetic complications:  Eye: Does *** have known diabetic retinopathy.  Neuro/ Feet: Does *** have known diabetic peripheral neuropathy. Renal: Patient does *** have known baseline CKD. She is *** on an ACEI/ARB at present.Check urine albumin/creatinine ratio yearly starting at time of diagnosis. If albuminuria is positive, treatment is geared toward better glucose, blood pressure control and use of ACE inhibitors or ARBs. Monitor  electrolytes and creatinine once to twice yearly.   3) Lipids: Patient is *** on a statin.    4) Hypertension: ***  at goal of < 140/90 mmHg.       Signed electronically by: Mack Guise, MD  [facility]  [facility] [address] [place], [place] [address] Phone: [phone] FAX: [phone]   CC: Berniece Salines, DO [address] [place] [address] Phone: [phone]  Fax: [phone]    Return to Endocrinology clinic as below: Future Appointments  Date Time Provider [place]  05/04/2022 10:30 AM Nazifa Trinka, Melanie Crazier, MD [facility] None  05/10/2022  1:00 PM Fenton Foy, NP [facility] None  05/11/2022  1:00 PM Guide, [facility]  07/18/2022  8:30 AM Skeet Latch, MD [facility]

## 2022-05-08 ENCOUNTER — Telehealth: Payer: Self-pay | Admitting: Licensed Clinical Social Worker

## 2022-05-08 NOTE — Telephone Encounter (Signed)
H&V Care Navigation CSW Progress Note  Clinical Social Worker contacted patient by phone to provide check in as discussed. No answer at [phone]. I left voicemail providing general check in, pt has upcoming appts at PCP and w/ Health Coach this week. I remain available, pt encouraged to f/u with me as needed.   Patient is participating in a Managed Medicaid Plan:  No, generic commercial plan only.  SDOH Screenings   Alcohol Screen: Not on file  Depression (PHQ2-9): Low Risk  (08/03/2020)   Depression (PHQ2-9)    PHQ-2 Score: 0  Financial Resource Strain: Medium Risk (04/28/2022)   Overall Financial Resource Strain (CARDIA)    Difficulty of Paying Living Expenses: Somewhat hard  Food Insecurity: No Food Insecurity (04/28/2022)   Hunger Vital Sign    Worried About Running Out of Food in the Last Year: Never true    Ran Out of Food in the Last Year: Never true  Housing: Low Risk  (04/28/2022)   Housing    Last Housing Risk Score: 0  Physical Activity: Not on file  Social Connections: Not on file  Stress: Stress Concern Present (04/28/2022)   [employer] of Occupational Health - Occupational Stress Questionnaire    Feeling of Stress : Rather much  Tobacco Use: Medium Risk (04/22/2022)   Patient History    Smoking Tobacco Use: Former    Smokeless Tobacco Use: Never    Passive Exposure: Not on file  Transportation Needs: No Transportation Needs (04/28/2022)   PRAPARE - Transportation    Lack of Transportation (Medical): No    Lack of Transportation (Non-Medical): No    Octavio Graves, MSW, LCSW Clinical Social Worker [facility] Care Navigation  [phone]- work cell phone (preferred) [phone]- desk phone

## 2022-05-10 ENCOUNTER — Encounter: Payer: Self-pay | Admitting: Nurse Practitioner

## 2022-05-10 ENCOUNTER — Ambulatory Visit (INDEPENDENT_AMBULATORY_CARE_PROVIDER_SITE_OTHER): Payer: PRIVATE HEALTH INSURANCE | Admitting: Nurse Practitioner

## 2022-05-10 ENCOUNTER — Telehealth: Payer: Self-pay

## 2022-05-10 VITALS — BP 165/102 | HR 75 | Temp 98.4°F | Ht 67.0 in | Wt 258.5 lb

## 2022-05-10 DIAGNOSIS — Z794 Long term (current) use of insulin: Secondary | ICD-10-CM | POA: Diagnosis not present

## 2022-05-10 DIAGNOSIS — E1165 Type 2 diabetes mellitus with hyperglycemia: Secondary | ICD-10-CM

## 2022-05-10 LAB — POCT GLYCOSYLATED HEMOGLOBIN (HGB A1C)
HbA1c POC (<> result, manual entry): 12.5 % (ref 4.0–5.6)
HbA1c, POC (controlled diabetic range): 12.5 % — AB (ref 0.0–7.0)
HbA1c, POC (prediabetic range): 12.5 % — AB (ref 5.7–6.4)
Hemoglobin A1C: 12.5 % — AB (ref 4.0–5.6)

## 2022-05-10 MED ORDER — OZEMPIC (0.25 OR 0.5 MG/DOSE) 2 MG/3ML ~~LOC~~ SOPN: 0.2500 mg | PEN_INJECTOR | SUBCUTANEOUS | 0 refills | Status: DC

## 2022-05-10 MED ORDER — METFORMIN HCL ER 500 MG PO TB24: 1000.0000 mg | ORAL_TABLET | Freq: Two times a day (BID) | ORAL | 5 refills | Status: DC

## 2022-05-10 NOTE — Patient Instructions (Addendum)
1. Type 2 diabetes mellitus with hyperglycemia, with long-term current use of insulin (HCC)  - POCT glycosylated hemoglobin (Hb A1C)  Lab Results  Component Value Date   HGBA1C 12.5 (A) 05/10/2022   HGBA1C 12.5 05/10/2022   HGBA1C 12.5 (A) 05/10/2022   HGBA1C 12.5 (A) 05/10/2022    - please keep upcoming appointment with endocrinology for diabetic management  - Semaglutide,0.25 or 0.5MG /DOS, (OZEMPIC, 0.25 OR 0.5 MG/DOSE,) 2 MG/3ML SOPN; Inject 0.25 mg into the skin once a week.  Dispense: 3 mL; Refill: 0 - metFORMIN (GLUCOPHAGE-XR) 500 MG 24 hr tablet; Take 2 tablets (1,000 mg total) by mouth in the morning and at bedtime.  Dispense: 120 tablet; Refill: 5  -diabetic diet  -stay active   Follow up:  Follow up in 3 months or sooner if needed

## 2022-05-10 NOTE — Telephone Encounter (Signed)
Pt called and Ask for a generic brand for [facility] or use the regular insulin that you both spoke about??

## 2022-05-10 NOTE — Progress Notes (Unsigned)
$'@Patient'N$  ID: Carolyn Zimmerman, female    DOB: March 24, 1986, 36 y.o.   MRN: [id number]  Chief Complaint  Patient presents with   Follow-up    Pt is here for follow up visit. Pt states her DM has been elevated pt states she just found out she has congestive heart failure    Referring provider: Vevelyn Francois, NP   HPI  36 year old with history of HTN, diabetes, and obesity.  Hypertension:  Is followed by cardiology - recently diagnosed with comgestive heart failure.   Continue her current regimen which includes HCTZ 25 mg ,valsartan 320 mg daily, amlodipine 10 mg daily, carvedilol 25 mg twice daily, Aldactone 25 mg daily, clonidine 0.2 mg at night.   Diabetes:  Currently not taking any diabetic medications - states that her A1C was high so cardiology told her to stop taking diabetic mediction. Did place a referral to endocrinology for management. Denies f/c/s, n/v/d, hemoptysis, PND, leg swelling Denies chest pain or edema  Denies f/c/s, n/v/d, hemoptysis, PND, leg swelling Denies chest pain or edema     Allergies  Allergen Reactions   Contrast Media [Iodinated Contrast Media] Hives and Swelling    Immunization History  Administered Date(s) Administered   Influenza,inj,Quad PF,6+ Mos 06/16/2017   Pneumococcal Polysaccharide-23 06/17/2017    Past Medical History:  Diagnosis Date   Abscess of chest wall 06/15/2017   Coronavirus infection 05/02/2020   Diabetes mellitus    Hypertension    Jaundice    Obesity    TOA (tubo-ovarian abscess) 09/2019    Tobacco History: Social History   Tobacco Use  Smoking Status Former   Packs/day: 0.50   Types: Cigarettes  Smokeless Tobacco Never   Counseling given: Not Answered   Outpatient Encounter Medications as of 05/10/2022  Medication Sig   albuterol (VENTOLIN HFA) 108 (90 Base) MCG/ACT inhaler Inhale 2 puffs into the lungs every 4 (four) hours as needed for wheezing or shortness of breath.   blood glucose meter kit and  supplies Dispense based on patient and insurance preference. Use up to four times daily as directed. (FOR ICD-9 250.00, 250.01).   cloNIDine (CATAPRES) 0.2 MG tablet Take 1 tablet (0.2 mg total) by mouth at bedtime.   furosemide (LASIX) 40 MG tablet Take 1 tablet (40 mg total) by mouth daily.   hydrochlorothiazide (HYDRODIURIL) 25 MG tablet Take 25 mg by mouth daily.   spironolactone (ALDACTONE) 25 MG tablet Take 1 tablet (25 mg total) by mouth daily.   valsartan (DIOVAN) 320 MG tablet Take 320 mg by mouth daily.   acetaminophen (TYLENOL) 500 MG tablet Take 2 tablets (1,000 mg total) by mouth every 8 (eight) hours as needed for mild pain. (Patient not taking: Reported on 05/10/2022)   amLODipine (NORVASC) 10 MG tablet Take 10 mg by mouth daily. (Patient not taking: Reported on 05/10/2022)   B-D ULTRAFINE III SHORT PEN 31G X 8 MM MISC See admin instructions.   carvedilol (COREG) 25 MG tablet Take 1 tablet (25 mg total) by mouth 2 (two) times daily.   insulin aspart protamine- aspart (NOVOLOG MIX 70/30) (70-30) 100 UNIT/ML injection Inject 0.6 mLs (60 Units total) into the skin 2 (two) times daily with a meal. (Patient not taking: Reported on 05/10/2022)   Insulin Syringe-Needle U-100 (INSULIN SYRINGE .3CC/31GX5/16") 31G X 5/16" 0.3 ML MISC Use with insulin, twice daily (Patient not taking: Reported on 05/10/2022)   metFORMIN (GLUCOPHAGE-XR) 500 MG 24 hr tablet Take 2 tablets (1,000 mg total) by mouth  in the morning and at bedtime. (Patient not taking: Reported on 05/10/2022)   rosuvastatin (CRESTOR) 10 MG tablet Take 1 tablet (10 mg total) by mouth daily.   Facility-Administered Encounter Medications as of 05/10/2022  Medication   cloNIDine (CATAPRES) tablet 0.2 mg     Review of Systems  Review of Systems  Constitutional: Negative.   HENT: Negative.    Cardiovascular: Negative.   Gastrointestinal: Negative.   Allergic/Immunologic: Negative.   Neurological: Negative.   Psychiatric/Behavioral:  Negative.         Physical Exam  Temp 98.4 F (36.9 C)   Ht $R'5\' 7"'FI$  (1.702 m)   Wt 258 lb 8 oz (117.3 kg)   BMI 40.49 kg/m   Wt Readings from Last 5 Encounters:  05/10/22 258 lb 8 oz (117.3 kg)  04/20/22 289 lb (131.1 kg)  03/28/22 281 lb (127.5 kg)  01/17/22 279 lb (126.6 kg)  01/12/22 278 lb (126.1 kg)     Physical Exam Vitals and nursing note reviewed.  Constitutional:      General: She is not in acute distress.    Appearance: She is well-developed.  Cardiovascular:     Rate and Rhythm: Normal rate and regular rhythm.  Pulmonary:     Effort: Pulmonary effort is normal.     Breath sounds: Normal breath sounds.  Neurological:     Mental Status: She is alert and oriented to person, place, and time.      Lab Results:  CBC    Component Value Date/Time   WBC 7.2 12/01/2021 1115   RBC 4.11 12/01/2021 1115   HGB 10.3 (L) 12/01/2021 1115   HCT 33.2 (L) 12/01/2021 1115   PLT 262 12/01/2021 1115   MCV 80.8 12/01/2021 1115   MCH 25.1 (L) 12/01/2021 1115   MCHC 31.0 12/01/2021 1115   RDW 14.5 12/01/2021 1115   LYMPHSABS 2.5 12/01/2021 1115   MONOABS 0.6 12/01/2021 1115   EOSABS 0.1 12/01/2021 1115   BASOSABS 0.0 12/01/2021 1115    BMET    Component Value Date/Time   NA 133 (L) 12/29/2021 0919   K 4.4 12/29/2021 0919   CL 97 12/29/2021 0919   CO2 23 12/29/2021 0919   GLUCOSE 360 (H) 12/29/2021 0919   GLUCOSE 303 (H) 12/01/2021 1115   BUN 17 12/29/2021 0919   CREATININE 0.82 12/29/2021 0919   CALCIUM 9.4 12/29/2021 0919   GFRNONAA >60 12/01/2021 1115   GFRAA >60 05/12/2020 0337    BNP    Component Value Date/Time   BNP 340.7 (H) 08/13/2021 0236      Assessment & Plan:   No problem-specific Assessment & Plan notes found for this encounter.     Fenton Foy, NP 05/10/2022

## 2022-05-11 ENCOUNTER — Encounter: Payer: Self-pay | Admitting: Nurse Practitioner

## 2022-05-11 ENCOUNTER — Telehealth: Payer: Self-pay

## 2022-05-11 ENCOUNTER — Ambulatory Visit (HOSPITAL_BASED_OUTPATIENT_CLINIC_OR_DEPARTMENT_OTHER): Payer: PRIVATE HEALTH INSURANCE

## 2022-05-11 DIAGNOSIS — Z0000 Encounter for general adult medical examination without abnormal findings: Secondary | ICD-10-CM

## 2022-05-11 NOTE — Telephone Encounter (Signed)
Called patient to reschedule due to not feeling well. Left patient a message informing her that her appointment has been rescheduled over the phone for 8/28 at 1:00pm. Patient will be called at this time.   Jase Reep Nedra Hai, [facility] Guide, Health Coach [address] [place] [address] Telephone: [phone] Email: [email or web address]

## 2022-05-11 NOTE — Telephone Encounter (Signed)
Could you check to see if prior auth needs to be completed for this medication.

## 2022-05-11 NOTE — Assessment & Plan Note (Signed)
-   POCT glycosylated hemoglobin (Hb A1C)  Lab Results  Component Value Date   HGBA1C 12.5 (A) 05/10/2022   HGBA1C 12.5 05/10/2022   HGBA1C 12.5 (A) 05/10/2022   HGBA1C 12.5 (A) 05/10/2022    - please keep upcoming appointment with endocrinology for diabetic management  - Semaglutide,0.25 or 0.5MG /DOS, (OZEMPIC, 0.25 OR 0.5 MG/DOSE,) 2 MG/3ML SOPN; Inject 0.25 mg into the skin once a week.  Dispense: 3 mL; Refill: 0 - metFORMIN (GLUCOPHAGE-XR) 500 MG 24 hr tablet; Take 2 tablets (1,000 mg total) by mouth in the morning and at bedtime.  Dispense: 120 tablet; Refill: 5  -diabetic diet  -stay active   Follow up:  Follow up in 3 months or sooner if needed

## 2022-05-15 ENCOUNTER — Telehealth: Payer: Self-pay

## 2022-05-15 ENCOUNTER — Ambulatory Visit: Payer: PRIVATE HEALTH INSURANCE

## 2022-05-15 DIAGNOSIS — Z0000 Encounter for general adult medical examination without abnormal findings: Secondary | ICD-10-CM

## 2022-05-15 NOTE — Telephone Encounter (Signed)
Patient was waking up at the time of the call and requested a call tomorrow due to being tired from getting children registered and prepared for school today. Patient has been rescheduled for her health coaching appointment on 8/29 at 1:00pm. Patient will be called at this time. Patient was informed that she was due for a visit with the pharmacy to get her blood pressure checked per Jeb Levering, RN. Patient wasn't aware of having to come in for a blood pressure check. Will follow up with [facility] for more details to provide patient during call tomorrow.   Marcelle Bebout Nedra Hai [facility] Guide, Health Coach [address] [place] [address] Telephone: [phone] Email: [email or web address]

## 2022-05-16 ENCOUNTER — Ambulatory Visit: Payer: PRIVATE HEALTH INSURANCE

## 2022-05-16 ENCOUNTER — Telehealth: Payer: Self-pay

## 2022-05-16 ENCOUNTER — Ambulatory Visit: Payer: PRIVATE HEALTH INSURANCE | Attending: Cardiovascular Disease

## 2022-05-16 DIAGNOSIS — Z0000 Encounter for general adult medical examination without abnormal findings: Secondary | ICD-10-CM

## 2022-05-16 NOTE — Addendum Note (Signed)
Addended by: Nilda Simmer T on: 05/16/2022 09:05 AM   Modules accepted: Orders

## 2022-05-16 NOTE — Progress Notes (Signed)
Appointment Outcome: Completed, Session #: 1                         Start time: 1:30pm   End time: 1:59pm   Total Mins: 29 minutes  AGREEMENTS SECTION   Overall Goal(s): Smoking Cessation (Quit Date: Sept 13) Stress Management   Agreement/Action Steps:  Smoking Cessation Start wearing nicotine patches as prescribed once prescription is picked up Do not smoke will wearing patches or remove patch to smoke Do not buy cigarettes once wearing the patch Set healthy boundaries with others who smoke     Stress Management Utilize support system Establish health boundaries within relationships Conduct self-check-ins Implement positive self-talk Continuing with prayer to calm self Practice deep breathing exercises Separate self from stress conversations  Progress Notes:  Patient stated that she was not able to start using the nicotine patches because she never sent notification from her pharmacy that a prescription was ready for pickup. Patient expressed her concern regarding not receiving a follow up call about her insulin/generic version of Ozempic from her PCP. Patient shared that this is a source of her stress trying to manage her health conditions and running into various issues with healthcare.   Patient stated that previous circumstances that were causing her to be overwhelmed as subsided. Patient shared that she still put safety measures in place for her children and self. Patient shared a stressful moment that occurred with her children's transportation home from school where her children got home later than normal off the bus but was told they were at school waiting to be picked up and wasn't there when she arrived. Patient stated that during this time trying to figure out where her children was and communicating with staff at the school contributed to her smoking 1/2 pack of cigarettes.   Patient reported that her smoking has not changed since her initial session although she is  working to reduce her stress and not respond negatively to stressful situations. Patient shared that her attitude towards things that would cause her to be stressed is nonchalant. Patient mentioned that sometimes she doesn't realize she is stressed until someone tells her that she is, or she notices an increase in her blood pressure.   Patient stated that there is no reason she should be mad for no reason when there are things that she cannot change or have control over and tries to talk positive about things and herself. Patient continues to engage in prayer to help keep herself calm. Discussed with patient if she felt that her approach to managing stress was her way of shutting down. Patient stated that it's not that she doesn't care about things but would rather not talk with others or think about stressors.    Indicators of Success and Accountability:  Patient is working to reduce her stress by not focusing on negative circumstances and implementing strategies to help her think and feel positive.  Readiness: Patient is in the action stage of stress management and in the preparation stage of smoking cessation.  Strengths and Supports: Patient is being supported by her family. Patient is resilient. Challenges and Barriers: Patient does not have access to prescriptions needed to work toward goal.   Coaching Outcomes: Checked patient's chart to determine if patches and medication prescriptions were sent to her pharmacy and there was no record of so at this time. Sent a follow up message to Dr. Harriet Masson and her nurse Dolores Hoose, RN regarding the  prescription for nicotine patches and Wellbutrin to aid in smoking cessation. Spoke with [place] in-person as well.   Discussed with a patient an alternative to obtaining the nicotine patches by contacting 1-800-QUITNOW if waiting for a prescription to be sent to her pharmacy.   Informed patient that during her last visit she was advised to make an appointment  with the pharmacy to get her blood pressure checked and manage her medications. Patient did not remember that instructions. Offered to have someone from the pharmacy to reach out to her to make the appointment. Patient stated that this route would be the best option.   Sent message to follow up with PCP regarding patient's medication to manage her diabetes.  Sent message to Karren Cobble in pharmacy at [facility] to follow up with patient to schedule an appointment per Dr. Terrial Rhodes request to have her blood pressure monitored regularly.   Attempted: Fulfilled - Patient is utilizing her support system, working on establishing healthy boundaries in relationships, implementing positive self-talk, and not engaging in stressful conversations. Not met - Patient did not practice deep breathing or conducting self-check-ins intentionally to help manage stress. Patient did not set up healthy boundaries with others who smoke for support with smoking cessation.   Not attempted: Deferred - Patient was not able to start nicotine replacement therapy or Wellbutrin to aid in smoking cessation due to waiting a prescription to be written and sent to pharmacy by provider.

## 2022-05-16 NOTE — Telephone Encounter (Signed)
Called patient to hold health coaching session. Patient was in the process of wrapping up her work shift and requested a call back in 20-30 minutes. Patient has been rescheduled for 1:30pm today. Patient will be called again at that time.    Treena Cosman Nedra Hai [facility] Guide, Health Coach [address] [place] [address] Telephone: [phone] Email: [email or web address]

## 2022-05-18 ENCOUNTER — Other Ambulatory Visit: Payer: Self-pay

## 2022-05-18 ENCOUNTER — Encounter: Payer: Self-pay | Admitting: Pharmacist

## 2022-05-18 ENCOUNTER — Telehealth: Payer: Self-pay

## 2022-05-18 ENCOUNTER — Telehealth: Payer: Self-pay | Admitting: Pharmacist

## 2022-05-18 DIAGNOSIS — Z0000 Encounter for general adult medical examination without abnormal findings: Secondary | ICD-10-CM

## 2022-05-18 MED ORDER — NICOTINE 14 MG/24HR TD PT24: 14.0000 mg | MEDICATED_PATCH | Freq: Every day | TRANSDERMAL | 0 refills | Status: DC

## 2022-05-18 NOTE — Telephone Encounter (Signed)
Called patient to provide update that a prescription has been sent to her pharmacy for the nicotine patches. Patient verbally expressed understanding.

## 2022-05-18 NOTE — Progress Notes (Signed)
Contacted patient regarding referral for hypertension and diabetes from Ivonne Andrew, NP .   Left patient a voicemail to return my call at their convenience. Will also send MyChart message.   Catie Eppie Gibson, PharmD, [facility] [phone]

## 2022-05-18 NOTE — Progress Notes (Signed)
Prescription sent to pharmacy.

## 2022-05-24 ENCOUNTER — Telehealth: Payer: Self-pay | Admitting: Clinical

## 2022-05-24 NOTE — Progress Notes (Signed)
Called patient back. Scheduled phone appointment.   Catie Eppie Gibson, PharmD, [facility] [phone]

## 2022-05-24 NOTE — Telephone Encounter (Signed)
Integrated Behavioral Health Case Management Referral Note  05/24/2022 Name: Carolyn Zimmerman MRN: [id number] DOB: 08/12/1986 Carolyn Zimmerman is a 36 y.o. year old female who sees Ivonne Andrew, NP for primary care. LCSW was consulted to assess patient's needs and assist the patient with [employer] .  Interpreter: No.   Interpreter Name & Language: none  Assessment: Patient experiencing Financial constraints related to inadequate prescription drug coverage .  Intervention: Patient is prescribed Ozempic but has been unable to afford it, even with her insurance. Referred to pharmacy, Catie Clearance [facility]s, who has also been trying to reach patient. CSW to follow.  Review of patient status, including review of consultants reports, relevant laboratory and other test results, and collaboration with appropriate care team members and the patient's provider was performed as part of comprehensive patient evaluation and provision of services.    Abigail Butts, LCSW [facility] [phone]

## 2022-05-25 ENCOUNTER — Other Ambulatory Visit: Payer: Self-pay

## 2022-05-25 ENCOUNTER — Telehealth: Payer: Self-pay

## 2022-05-25 ENCOUNTER — Other Ambulatory Visit: Payer: Self-pay | Admitting: Pharmacist

## 2022-05-25 ENCOUNTER — Other Ambulatory Visit: Payer: PRIVATE HEALTH INSURANCE | Admitting: Pharmacist

## 2022-05-25 DIAGNOSIS — E1165 Type 2 diabetes mellitus with hyperglycemia: Secondary | ICD-10-CM

## 2022-05-25 DIAGNOSIS — I10 Essential (primary) hypertension: Secondary | ICD-10-CM

## 2022-05-25 MED ORDER — OZEMPIC (0.25 OR 0.5 MG/DOSE) 2 MG/3ML ~~LOC~~ SOPN
0.5000 mg | PEN_INJECTOR | SUBCUTANEOUS | 2 refills | Status: DC
  Filled 2022-05-25: qty 9, fill #0

## 2022-05-25 MED ORDER — AMLODIPINE BESYLATE 10 MG PO TABS: 10.0000 mg | ORAL_TABLET | Freq: Every day | ORAL | 3 refills | Status: DC

## 2022-05-25 NOTE — Patient Instructions (Addendum)
Carolyn Zimmerman,   Let's pursue applying for the Ozempic for free from the manufacturer. Our team at the [facility] at [facility] can help you with that. I sent the prescription there - go by there next week. They will need proof of income - please bring your last W2, tax return, or 4 last pay stubs with you.   Try metformin XR 500 mg once daily for a week, then increase to 500 mg twice daily.   I'll coordinate with the team about the best treatment for tobacco cessation.   Check your blood sugars twice daily:  1) Fasting, first thing in the morning before breakfast and  2) 2 hours after your largest meal.   For a goal A1c of less than 7%, goal fasting readings are less than 130 and goal 2 hour after meal readings are less than 180.    Thanks!  Catie Eppie Gibson, PharmD, [facility] [phone]

## 2022-05-25 NOTE — Telephone Encounter (Signed)
I reached out to patient to assist with enrollment in NOVO PAP for Ozempic.

## 2022-05-25 NOTE — Progress Notes (Signed)
Chief Complaint  Patient presents with   Diabetes   Hypertension    Terri Rorrer is a 36 y.o. year old female who presented for a telephone visit.   They were referred to the pharmacist by their PCP for assistance in managing diabetes.   Subjective:  Care Team: Primary Care Provider: Ivonne Andrew, NP ; Next Scheduled Visit: 06/09/22 Cardiologist: Tobb/Kinney; Next Scheduled Visit: 07/18/22  Medication Access/Adherence  Current Pharmacy:  [id number] - [place], [place] - [address] AT [facility] [address] [place]  [address] Phone: [phone] Fax: [phone]   Patient reports affordability concerns with their medications: Yes  Patient reports access/transportation concerns to their pharmacy: No  Patient reports adherence concerns with their medications:  Yes  reports that she keeps her medications in the car and with her to help her remember to take them. Does report that her Ozempic was several hundred dollars. She notes she is taking her mother's prescription right now.   Diabetes:  Current medications: metformin XR 1000 mg twice daily- though reports she stopped because she feels more constipated on XR metformin than IR, previously on insulin 70/30; prescribed Ozempic 0.25 mg weekly x 2 weeks Medications tried in the past: denies prior trial of GLP1  Current glucose readings: has not been checking   Patient denies hypoglycemic s/sx including dizziness, shakiness, sweating, report she did have these previously on insulin  Hyperlipidemia/ASCVD Risk Reduction  Current lipid lowering medications: rosuvastatin 10 mg daily  Heart Failure:  Current medications:  ACEi/ARB/ARNI: valsartan 320 mg daily SGLT2i: none Beta blocker: carvedilol 25 mg twice daily Mineralocorticoid Receptor Antagonist: spironolactone 25 mg daily Diuretic regimen: furosemide 40 mg daily, Additional antihypertensives: HCTZ 25 mg daily,  amlodipine 10 mg daily, clonidine 0.2 mg QPM - though has not been taking lately  Per last note from Dr. Servando Salina, they attempted ARNI therapy but insurance also didn't cover.   Tobacco Abuse:  Current medications: nicotine 14 mg patches daily;   Does indicate insomnia. Reports there was a discussion about bupropion, but she hasn't heard anything   Health Maintenance  Health Maintenance Due  Topic Date Due   COVID-19 Vaccine (1) Never done   FOOT EXAM  Never done   OPHTHALMOLOGY EXAM  Never done   TETANUS/TDAP  Never done   PAP SMEAR-Modifier  Never done   INFLUENZA VACCINE  04/18/2022     Objective: Lab Results  Component Value Date   HGBA1C 12.5 (A) 05/10/2022   HGBA1C 12.5 05/10/2022   HGBA1C 12.5 (A) 05/10/2022   HGBA1C 12.5 (A) 05/10/2022    Lab Results  Component Value Date   CREATININE 0.82 12/29/2021   BUN 17 12/29/2021   NA 133 (L) 12/29/2021   K 4.4 12/29/2021   CL 97 12/29/2021   CO2 23 12/29/2021    Lab Results  Component Value Date   CHOL 172 12/29/2021   HDL 34 (L) 12/29/2021   LDLCALC 100 (H) 12/29/2021   TRIG 219 (H) 12/29/2021   CHOLHDL 5.1 (H) 12/29/2021    Medications Reviewed Today     Reviewed by Alden Hipp, RPH-CPP (Pharmacist) on 05/25/22 at 1523  Med List Status: <None>   Medication Order Taking? Sig Documenting Provider Last Dose Status Informant  acetaminophen (TYLENOL) 500 MG tablet [id number]  Take 2 tablets (1,000 mg total) by mouth every 8 (eight) hours as needed for mild pain.  Patient not taking: Reported on 05/10/2022  Azzie Glatter, FNP  Active Self  albuterol (VENTOLIN HFA) 108 (90 Base) MCG/ACT inhaler [id number]  Inhale 2 puffs into the lungs every 4 (four) hours as needed for wheezing or shortness of breath. Azzie Glatter, FNP  Active Self  amLODipine (NORVASC) 10 MG tablet [id number] Yes Take 10 mg by mouth daily. [provider] Taking Active   B-D ULTRAFINE III SHORT PEN 31G X 8 MM MISC [id number]  No See admin instructions.  Patient not taking: Reported on 05/25/2022   [provider] Not Taking Active Self  blood glucose meter kit and supplies [id number] Yes Dispense based on patient and insurance preference. Use up to four times daily as directed. (FOR ICD-9 250.00, 250.01). Azzie Glatter, FNP Taking Active Self  carvedilol (COREG) 25 MG tablet [id number] Yes Take 1 tablet (25 mg total) by mouth 2 (two) times daily. Berniece Salines, DO Taking Active   cloNIDine (CATAPRES) 0.2 MG tablet [id number] No Take 1 tablet (0.2 mg total) by mouth at bedtime.  Patient not taking: Reported on 05/25/2022   Berniece Salines, DO Not Taking Active   cloNIDine (CATAPRES) tablet 0.2 mg [id number]   Tobb, Kardie, DO  Active   furosemide (LASIX) 40 MG tablet [id number] Yes Take 1 tablet (40 mg total) by mouth daily. Tobb, Kardie, DO Taking Active   hydrochlorothiazide (HYDRODIURIL) 25 MG tablet [id number] Yes Take 25 mg by mouth daily. [provider] Taking Active   insulin aspart protamine- aspart (NOVOLOG MIX 70/30) (70-30) 100 UNIT/ML injection [id number] No Inject 0.6 mLs (60 Units total) into the skin 2 (two) times daily with a meal.  Patient not taking: Reported on 05/10/2022   Azzie Glatter, FNP Not Taking Active Self  Insulin Syringe-Needle U-100 (INSULIN SYRINGE .3CC/31GX5/16") 31G X 5/16" 0.3 ML MISC [id number]  Use with insulin, twice daily  Patient not taking: Reported on 05/10/2022   Rai, Vernelle Emerald, MD  Active Self  metFORMIN (GLUCOPHAGE-XR) 500 MG 24 hr tablet [id number] Yes Take 2 tablets (1,000 mg total) by mouth in the morning and at bedtime. Fenton Foy, NP Taking Active   nicotine (NICODERM CQ - DOSED IN MG/24 HOURS) 14 mg/24hr patch [id number] Yes Place 1 patch (14 mg total) onto the skin daily. Tobb, Kardie, DO Taking Active   rosuvastatin (CRESTOR) 10 MG tablet [id number]  Take 1 tablet (10 mg total) by mouth daily. Tobb, Kardie, DO  Expired 04/20/22 2359   Semaglutide,0.25 or  0.$Rem'5MG'wtnT$ /DOS, (OZEMPIC, 0.25 OR 0.5 MG/DOSE,) 2 MG/3ML SOPN [id number] Yes Inject 0.25 mg into the skin once a week. Fenton Foy, NP Taking Active   spironolactone (ALDACTONE) 25 MG tablet [id number] Yes Take 1 tablet (25 mg total) by mouth daily. Tobb, Kardie, DO Taking Active   valsartan (DIOVAN) 320 MG tablet [id number] Yes Take 320 mg by mouth daily. [provider] Taking Active               Assessment/Plan:   Diabetes: - Currently uncontrolled - Reviewed long term cardiovascular and renal outcomes of uncontrolled blood sugar - Reviewed goal A1c, goal fasting, and goal 2 hour post prandial glucose - Reviewed insurance card. Her plan does not have true "pharmacy insurance", it is a discount card. Therefore, we could pursue patient assistance. Discussed collaboration with the [place] at [facility] Discussed with PCP. Script sent to the pharmacy.  - Recommend to restart metformin XR at 500 mg twice daily instead of 1000 mg twice daily to see if better tolerated.  -  Discussed importance of regular glucose checks. Encouraged fasting and post prandial.    Hyperlipidemia/ASCVD Risk Reduction: - Currently uncontrolled.  - Recommend to continue rosuvastatin as prescribed   Heart Failure: - Currently with opportunity for optimization - Will collaborate with cardiology to consider PAP for Entresto for greater potency.  - Recommend consideration for SGLT2 once glucose is better controlled to prevent lack of tolerability   Tobacco Abuse - Currently uncontrolled - Recommended to remove nicotine patch at bedtime due to insomnia. Discussed use of nicotine gum/lozenges for PRN benefit. Will discuss tobacco cessation plans with cardiology.   Follow Up Plan: phone call in a week  Catie TJodi Mourning, PharmD, [facility] [phone]

## 2022-05-26 ENCOUNTER — Telehealth: Payer: Self-pay | Admitting: Licensed Clinical Social Worker

## 2022-05-26 ENCOUNTER — Other Ambulatory Visit: Payer: Self-pay | Admitting: Pharmacist

## 2022-05-26 ENCOUNTER — Other Ambulatory Visit: Payer: Self-pay

## 2022-05-26 DIAGNOSIS — E1165 Type 2 diabetes mellitus with hyperglycemia: Secondary | ICD-10-CM

## 2022-05-26 DIAGNOSIS — F17200 Nicotine dependence, unspecified, uncomplicated: Secondary | ICD-10-CM

## 2022-05-26 MED ORDER — TRULICITY 0.75 MG/0.5ML ~~LOC~~ SOAJ
0.7500 mg | SUBCUTANEOUS | 2 refills | Status: DC
  Filled 2022-05-26 – 2022-07-06 (×2): qty 2, 28d supply, fill #0

## 2022-05-26 MED ORDER — NICOTINE POLACRILEX 4 MG MT GUM: 4.0000 mg | CHEWING_GUM | OROMUCOSAL | 3 refills | Status: DC | PRN

## 2022-05-26 MED ORDER — SEMAGLUTIDE (1 MG/DOSE) 4 MG/3ML ~~LOC~~ SOPN
1.0000 mg | PEN_INJECTOR | SUBCUTANEOUS | 2 refills | Status: DC
  Filled 2022-05-26: qty 3, 28d supply, fill #0
  Filled 2022-08-18: qty 9, 84d supply, fill #0

## 2022-05-26 NOTE — Telephone Encounter (Signed)
H&V Care Navigation CSW Progress Note  Clinical Social Worker contacted patient by phone to provide general check in. Was able to reach pt at [phone]. Pt shares she is doing well, trying to manage her general health and her kids. No big plans for upcoming birthday on 9/13. She is appreciative of call with PharmD Catie Clearance Coots, was disappointed to find out that her insurance doesn't include pharmacy benefits. LCSW offered to connect her with [facility] program on top of the other assistance discussed w/ pt by PharmD. I also have spoken with Health Coach about speaking with her about nicotine patches through 1-800-quitnow if cost of those is an issue. Also discussed CAFA as pt may have enough outstanding bills to qualify for hardship assistance. I have sent both applications started for her. I will f/u with her the week of 9/18 to answer any additional questions/concerns.   Patient is participating in a Managed Medicaid Plan:  No, commercial insurance only.   SDOH Screenings   Food Insecurity: No Food Insecurity (04/28/2022)  Housing: Low Risk  (04/28/2022)  Transportation Needs: No Transportation Needs (04/28/2022)  Depression (PHQ2-9): Low Risk  (08/03/2020)  Financial Resource Strain: Medium Risk (04/28/2022)  Stress: Stress Concern Present (04/28/2022)  Tobacco Use: Medium Risk (05/11/2022)    Octavio Graves, MSW, LCSW Clinical Social Worker II [facility] Health Heart/Vascular Care Navigation  [phone]- work cell phone (preferred) [phone]- desk phone

## 2022-05-26 NOTE — Progress Notes (Signed)
Care Coordination Call  Called patient. Discussed that Dr. Servando Salina and I discussed dual nicotine replacement therapy with nicotine gum. Counseled patient on park and chew method. Nicotine gum ordered under co-sign for Dr. Servando Salina.   Also discussed that Pharmacy at [facility] does not have Ozempic right now to supply for uninsured patients, but they do have Trulicity. Trulicity PAP is closed right now. We can start patient on Trulicity now, and continue Trulicity therapy through [facility] supply until Ozempic PAP supply arrives. Discussed with PCP, she is in agreement. Reviewed with patient. She verbalized understanding. Will send video on Trulicity injection technique.   Start Trulicity 0.75 mg weekly. Continue until Ozempic PAP supply arrives. Applied for 1 mg dose as anticipate needing to titrate to that point (additionally, Ozempic 1 mg pen can deliver lower doses).   Follow up call in 2 weeks.  Catie Eppie Gibson, PharmD, [facility] [phone]

## 2022-05-30 ENCOUNTER — Ambulatory Visit: Payer: PRIVATE HEALTH INSURANCE

## 2022-05-30 ENCOUNTER — Ambulatory Visit: Payer: Self-pay

## 2022-05-30 ENCOUNTER — Other Ambulatory Visit: Payer: Self-pay

## 2022-05-30 ENCOUNTER — Telehealth: Payer: Self-pay

## 2022-05-30 DIAGNOSIS — Z0000 Encounter for general adult medical examination without abnormal findings: Secondary | ICD-10-CM

## 2022-05-30 NOTE — Telephone Encounter (Signed)
Called patient to hold session. Patient was at work and requested a call back after an hour Patient has been scheduled for a call at 2:45pm today.

## 2022-05-30 NOTE — Progress Notes (Signed)
Appointment Outcome: Completed, Session #: 2                       Start time: 2:47pm   End time: 3:19am   Total Mins: 32 minutes  AGREEMENTS SECTION   Overall Goal(s): Smoking Cessation (Quit Date: Sept 13) Stress Management   Agreement/Action Steps:  Smoking Cessation Start wearing nicotine patches as prescribed once prescription is picked up Do not smoke will wearing patches or remove patch to smoke Do not buy cigarettes once wearing the patch Set healthy boundaries with others who smoke   Stress Management Utilize support system Establish health boundaries within relationships Conduct self-check-ins Implement positive self-talk Continuing with prayer to calm self Practice deep breathing exercises Separate self from stress conversations   Progress Notes:  Patient shared that she was able to get her patches covered by the insurance that she has. Patient mentioned that she has been in communication with a provider regarding her experience while using the patch which caused her to feel antsy and not able to sleep. Patient stated that she was doing find prior to this, but also experienced a major stressor with her stalker again. Patient was advised to take the patch off at night to see if it would help her sleep but have not been able to get quality sleep.   Patient stated that she tried to quit cold [place], but it did not last long due to the stress. Patient mentioned that the provider has sent a prescription for nicotine gum that she will pick up on Thursday. Patient was informed that she could try the patch again if she like by the provider but advised to stop if she gets the same results. Patient will not be prescribed Wellbutrin due to provider not wanting to increase the patient's anxiety.   Patient reported that she is smoking between 7-8 cigarettes a day. Patient stated that this is a huge difference from about a pack of cigarettes per day. Patient stated that although she did  not quit by the 9/13, she is still working towards her goal. Patient shared that she is keeping herself busy at work, so she doesn't think about smoking. Patient stated this strategy helps some at work, but she finds herself eating more. Patient mentioned that instead of eating junk food, she is eating cashews and drinking cranberry juice. Patient stated that she doesn't want to replace smoking with eating although she is choosing healthier options.   Patient reported relying on support from a friend when encountering a stressful face-to-face encounter with her stalker. Patient was in public and was stressed but was not able to separate herself from the person. He eventually left but returned to the public location she was at. Patient was in the process of attempting to call 911 but was asked by him what she was doing. Patient also implemented additional stress management steps to keep her calm buy talking to herself, praying, trying to establish boundaries. Patient was able to implement action steps as outlined to manage stress in other stressful moments such as work.    Indicators of Success and Accountability:  Patient started to wear the patches and after an adverse reaction, patient tried to quit cold [place], but has managed to reduce her smoking from about one pack of cigarettes per day to 7-8 cigarettes per day.  Readiness: Patient is in the action stage of smoking cessation and stress management.  Strengths and Supports: Patient is being supported by family  and friends. Patient is resilient and is determined to quit smoking.  Challenges and Barriers: Patient had an adverse reaction to the patches and encounters stress from unsolicited appearances of ex-boyfriend.    Coaching Outcomes: Patient will implement the following action steps towards smoking cessation as outlined below over the next two weeks. Will discuss with patient a quit date during next session if she has not quit smoking with the  use of the nicotine gum.    Smoking Cessation Start using nicotine gum as prescribed once prescription is picked up Do not smoke while once started using nicotine gum Do not buy cigarettes Set healthy boundaries with others who smoke Implement stress management techniques when urge to smoke arise    Attempted: Fulfilled - Patient implemented her action steps to manage stress as outlined above over the past two weeks.  Partial - Patient was able to wear the nicotine patch for a few days but had an adverse reaction and stopped wearing them.  Not met - Patient resumed buying cigarettes and does enforce a boundary with others who smoke around her.

## 2022-06-01 ENCOUNTER — Other Ambulatory Visit: Payer: Self-pay

## 2022-06-08 ENCOUNTER — Other Ambulatory Visit: Payer: PRIVATE HEALTH INSURANCE | Admitting: Pharmacist

## 2022-06-08 ENCOUNTER — Telehealth: Payer: Self-pay | Admitting: Pharmacist

## 2022-06-08 NOTE — Progress Notes (Signed)
Attempted to contact patient for scheduled appointment for medication management. Left HIPAA compliant message for patient to return my call at their convenience.   Will also send MyChart message  Catie T. Scottlyn Mchaney, PharmD, BCACP [facility] [phone]  

## 2022-06-09 ENCOUNTER — Other Ambulatory Visit: Payer: Self-pay

## 2022-06-09 ENCOUNTER — Ambulatory Visit: Payer: Self-pay | Admitting: Nurse Practitioner

## 2022-06-14 NOTE — Progress Notes (Signed)
Attempted to contact patient to reschedule appointment for medication management. Left HIPAA compliant message for patient to return my call at their convenience.   

## 2022-06-15 ENCOUNTER — Ambulatory Visit: Payer: PRIVATE HEALTH INSURANCE

## 2022-06-15 DIAGNOSIS — Z0000 Encounter for general adult medical examination without abnormal findings: Secondary | ICD-10-CM

## 2022-06-15 NOTE — Progress Notes (Signed)
Appointment Outcome: Completed, Session #: 3                         Start time: 2:11pm   End time: 2:41pm  Total Mins: 30 minutes  AGREEMENTS SECTION   Overall Goal(s): Smoking Cessation (Quit Date: Sept 13) Stress Management   Agreement/Action Steps:  Smoking Cessation Start wearing nicotine patches as prescribed once prescription is picked up Do not smoke will wearing patches or remove patch to smoke Do not buy cigarettes once wearing the patch Set healthy boundaries with others who smoke   Stress Management Utilize support system Establish health boundaries within relationships Conduct self-check-ins Implement positive self-talk Continuing with prayer to calm self Practice deep breathing exercises Separate self from stress conversations    Progress Notes:  Patient rated her stress level today a 4/10 compared to yesterday being a 10/10. Patient stated that she took time to herself to rest and decompress and setting boundaries with others by not answering her phone to minimize stress. Patient shared that she dealt with her stress by separating herself from the stressful event, monitoring her responses, and setting boundaries with others around interacting. Patient was able to get support from her boss at some point that helped with managing stress.   Patient reported that because she was upset that she smoked 6 cigarettes within 15 minutes. Patient mentioned that she has 7 cigarettes left in the pack she purchased on Monday. Patient has smoked a total of 13 cigarettes since Monday patient is smoking an average of 2-3 cigarettes per day. Patient shared that she wasn't sure if she could quit cold [place] even though it is in her mind to quit. Patient stated that cutting back has been working for her. Patient stated that she gave herself another quit date of 10/5, which would be a gift to her son.  Patient reported that she is smoking because she was not able to continue wearing the  nicotine patches due to them causing her not to be able to sleep. Patient stated that the longest time she can go without smoking is from 7:30am to about 6-7pm without smoking. Patient mentioned that during other times she manages her urges to smoke with chewing gum and eating healthy snacks (e.g., peanuts, cashews, and apple sauce). Patient shared that this helps her keep her mind off smoking.   Patient mentioned that she may get triggered at work by others smoking but is able to control her urges majority of the time by talking to herself and encouraging herself not to smoking. Patient also uses this strategy to manage stress by telling herself that things will be okay and not to get upset about things that I can't change. Patient shared that she even tried drinking water to help with stress management and to deal with the urge to smoke. Patient stated that due to having a headache and the Tylenol wasn't working which was another trigger for smoking to calm down and relax. Patient reported that she practiced deep breathing while smoking but not intentionally at other times to manage stress.    Indicators of Success and Accountability:  Patient stated that keeping herself busy to manage her urges to smoke, not thinking about smoking, and being able to cut back by using chewing gum and healthy snacks.  Readiness: Patient is in the action stage of smoking cessation and stress management.  Strengths and Supports: Patient is being supported by her family and boss. Patient takes  initiative and is creative in the ways that she manages her urges to smoke.  Challenges and Barriers: Patient does not foresee any challenges to implementing action steps over the next two weeks.    Coaching Outcomes: Patient action steps to be implemented for smoking cessation and stress management have been revised and outlined below.   Discussed side effects of symptoms from increasing nicotine levels in body after they have  been low for some time. Patient understands that headaches are a withdrawal symptom of smoking cessation.   Patient will start practicing self-care by setting aside time for herself to relax and not have to interact with others to aid in stress management.   Overall Goal(s): Smoking Cessation (Quit Date: June 22, 2022) Stress Management   Agreement/Action Steps:  Smoking Cessation Reducing smoking by practicing mini quits Engage in various activities to distract self from smoking Reduce smoking 3-4 cigarettes to 0 by June 22, 2022 Manage urges to smoke by chewing gum, drinking water, and eating healthy snacks (e.g., peanuts, cashews, and apple sauce) Set healthy boundaries with others who smoke   Stress Management Utilize support system Establish health boundaries within relationships Conduct self-check-ins Implement positive self-talk Continuing with prayer to calm self Practice self-care by resting  Separate self from stress conversations    Attempted: Fulfilled - Patient has healthy boundaries around others smoking near her. Patient is utilizing her support system and establishing boundaries with others to manage stress in relationships. Patient is conducted self-check-ins and implementing positive self-talk and separating herself from stressful situations.    Not Attempted: Dropped/Revised - Patient is not able to wear the nicotine patches due to adverse reaction. Patient does not engage in deep breathing intentionally and it has been removed from her action steps to manage stress.

## 2022-06-21 ENCOUNTER — Telehealth: Payer: Self-pay | Admitting: Licensed Clinical Social Worker

## 2022-06-21 NOTE — Telephone Encounter (Signed)
H&V Care Navigation CSW Progress Note  Clinical Social Worker contacted patient by phone (text message) to remind pt of PCP appt tomorrow and for her to f/u with social worker at clinic to submit her [facility] application. I f/u with Claudie Revering, who has also left message for pt.   Patient is participating in a Managed Medicaid Plan:  No, generic commercial plan only.   SDOH Screenings   Food Insecurity: No Food Insecurity (04/28/2022)  Housing: Low Risk  (04/28/2022)  Transportation Needs: No Transportation Needs (04/28/2022)  Depression (PHQ2-9): Low Risk  (08/03/2020)  Financial Resource Strain: Medium Risk (04/28/2022)  Stress: Stress Concern Present (04/28/2022)  Tobacco Use: Medium Risk (05/11/2022)   Westley Hummer, MSW, [place]  [phone]- work cell phone (preferred) [phone]- desk phone

## 2022-06-21 NOTE — Telephone Encounter (Signed)
Pt scheduled 06/29/2022 @10am 

## 2022-06-22 ENCOUNTER — Other Ambulatory Visit: Payer: Self-pay

## 2022-06-22 ENCOUNTER — Ambulatory Visit: Payer: Self-pay | Admitting: Nurse Practitioner

## 2022-06-29 ENCOUNTER — Other Ambulatory Visit: Payer: PRIVATE HEALTH INSURANCE | Admitting: Pharmacist

## 2022-06-29 ENCOUNTER — Ambulatory Visit: Payer: PRIVATE HEALTH INSURANCE

## 2022-06-29 ENCOUNTER — Telehealth: Payer: Self-pay | Admitting: Pharmacist

## 2022-06-29 ENCOUNTER — Telehealth: Payer: Self-pay

## 2022-06-29 NOTE — Progress Notes (Signed)
Attempted to contact patient for scheduled appointment for medication management. Left HIPAA compliant message for patient to return my call at their convenience.   If patient does not return call by end of day, will collaborate with Care Guide to reschedule  Catie Hedwig Morton, PharmD, [facility] [phone]

## 2022-06-29 NOTE — Telephone Encounter (Signed)
Left voice message for patient informing her that our office will need to reschedule her appointment today Thursday 06/29/22 with Avelino Leeds, Care Guide at 2:00 PM. Asked to give our office a call back

## 2022-06-29 NOTE — Progress Notes (Signed)
Care Coordination Call  Spoke briefly with patient. She was in the midst of getting her kids home from school and did not have time for a full medication review.   She reports she overslept yesterday and missed taking her morning medications before getting her kids to school, resulting in significantly elevated blood pressure and headache that led to her going to Emergency Room. Reports she is feeling better and took her medications this morning.   She notes she has not completed the [place] [employer] application that LCSW mailed to her, but has not gone through her mail lately.   Rescheduled phone call for next Thursday.   Catie Hedwig Morton, PharmD, [facility] [phone]

## 2022-07-03 ENCOUNTER — Telehealth: Payer: Self-pay

## 2022-07-03 DIAGNOSIS — Z0000 Encounter for general adult medical examination without abnormal findings: Secondary | ICD-10-CM

## 2022-07-03 NOTE — Telephone Encounter (Signed)
Called patient to reschedule health coaching session from 10/12. Patient has been rescheduled for 10/19 at 2:00pm. Patient stated that she has had suicidal thoughts. Provided patient with mental health resources via email for her to contact for additional support. Will call patient at scheduled time.    Ellyssa Zagal [facility] Guide, Health Coach [address], [address] [place] [address] Telephone: [phone] Email: [email or web address]

## 2022-07-06 ENCOUNTER — Telehealth: Payer: Self-pay | Admitting: Licensed Clinical Social Worker

## 2022-07-06 ENCOUNTER — Other Ambulatory Visit: Payer: Self-pay

## 2022-07-06 ENCOUNTER — Other Ambulatory Visit: Payer: PRIVATE HEALTH INSURANCE | Admitting: Pharmacist

## 2022-07-06 ENCOUNTER — Telehealth: Payer: Self-pay

## 2022-07-06 ENCOUNTER — Ambulatory Visit: Payer: PRIVATE HEALTH INSURANCE

## 2022-07-06 DIAGNOSIS — Z0000 Encounter for general adult medical examination without abnormal findings: Secondary | ICD-10-CM

## 2022-07-06 MED ORDER — VALSARTAN 320 MG PO TABS
320.0000 mg | ORAL_TABLET | Freq: Every day | ORAL | 1 refills | Status: DC
  Filled 2022-07-06: qty 90, 90d supply, fill #0

## 2022-07-06 MED ORDER — ROSUVASTATIN CALCIUM 10 MG PO TABS
10.0000 mg | ORAL_TABLET | Freq: Every day | ORAL | 3 refills | Status: DC
  Filled 2022-07-06: qty 90, 90d supply, fill #0

## 2022-07-06 NOTE — Telephone Encounter (Signed)
Called patient to hold health coaching session over the phone. Patient did not answer. Left message for patient to return call to hold session or to reschedule.    Swade Shonka [facility] Guide, Health Coach [address], [address] [place] [address] Telephone: [phone] Email: [email or web address]

## 2022-07-06 NOTE — Patient Instructions (Addendum)
Lillianna,   Here is the website for [facility] MedAssist. Complete the application on here: [email or web address] (scroll down to Enroll Now). You can also call [place] MedAssist to enroll.   Take care!  Catie Hedwig Morton, PharmD, [facility] [phone]

## 2022-07-06 NOTE — Telephone Encounter (Signed)
H&V Care Navigation CSW Progress Note  Clinical Social Worker  mailed pt a new application for [employer] at the request of Catie, PharmD,  to help with medication costs as pt's insurance does not include pharmacy benefits. Previous application per her report was not received. I will f/u again to see if received next week.   Patient is participating in a Managed Medicaid Plan:  No, self pay only.   SDOH Screenings   Food Insecurity: No Food Insecurity (04/28/2022)  Housing: Low Risk  (04/28/2022)  Transportation Needs: No Transportation Needs (04/28/2022)  Depression (PHQ2-9): Low Risk  (08/03/2020)  Financial Resource Strain: Medium Risk (04/28/2022)  Stress: Stress Concern Present (04/28/2022)  Tobacco Use: Medium Risk (05/11/2022)   Westley Hummer, MSW, [place]  [phone]- work cell phone (preferred) [phone]- desk phone

## 2022-07-06 NOTE — Progress Notes (Signed)
07/06/2022 Name: Carolyn Zimmerman MRN: [id number] DOB: 1985-12-28  Chief Complaint  Patient presents with   Medication Management   Diabetes   Hypertension   Hyperlipidemia    Carolyn Zimmerman is a 36 y.o. year old female who presented for a telephone visit.   They were referred to the pharmacist by their PCP for assistance in managing medication access.    Subjective:  Care Team: Primary Care Provider: Fenton Foy, NP ; Next Scheduled Visit: 07/20/22 Cardiology: Oval Linsey; Next Scheduled Visit: 07/18/22  Medication Access/Adherence  Current Pharmacy:  [facility], [place] [address] Phone: [phone] Fax: [phone]   Patient reports affordability concerns with their medications: Yes  Patient reports access/transportation concerns to their pharmacy: No  Patient reports adherence concerns with their medications:  Yes     Diabetes:  Current medications: metformin XR 500 mg - 2 tablets twice daily, still taking her mother's Ozempic 0.5 mg weekly  Current glucose readings: not checking  Plan to apply for patient assistance for Ozempic, but use Trulicity from [employer] while awaiting Ozempic approval.  Hyperlipidemia/ASCVD Risk Reduction  Current lipid lowering medications: prescribed rosuvastatin 10 mg daily - patient does not have  Heart Failure:  Current medications:  ACEi/ARB/ARNI: valsartan 320 mg on her list, but patient does not have  SGLT2i: not on, consider moving forward Beta blocker: carvedilol 25 mg twice daily Mineralocorticoid Receptor Antagonist: spironolactone 25 mg daily Diuretic regimen: prescribed furosemide 40 mg daily, but patient reports she has not been needing.   Reports that her swelling has been well controlled lately.   Reports home SBP readings ~160s on the cuff that her friend has at work. She needs batteries for her cuff.   Health  Maintenance  Health Maintenance Due  Topic Date Due   COVID-19 Vaccine (1) Never done   FOOT EXAM  Never done   OPHTHALMOLOGY EXAM  Never done   Diabetic kidney evaluation - Urine ACR  Never done   TETANUS/TDAP  Never done   PAP SMEAR-Modifier  Never done   INFLUENZA VACCINE  04/18/2022     Objective: Lab Results  Component Value Date   HGBA1C 12.5 (A) 05/10/2022   HGBA1C 12.5 05/10/2022   HGBA1C 12.5 (A) 05/10/2022   HGBA1C 12.5 (A) 05/10/2022    Lab Results  Component Value Date   CREATININE 0.82 12/29/2021   BUN 17 12/29/2021   NA 133 (L) 12/29/2021   K 4.4 12/29/2021   CL 97 12/29/2021   CO2 23 12/29/2021    Lab Results  Component Value Date   CHOL 172 12/29/2021   HDL 34 (L) 12/29/2021   LDLCALC 100 (H) 12/29/2021   TRIG 219 (H) 12/29/2021   CHOLHDL 5.1 (H) 12/29/2021    Medications Reviewed Today     Reviewed by Osker Mason, RPH-CPP (Pharmacist) on 05/25/22 at 1523  Med List Status: <None>   Medication Order Taking? Sig Documenting Provider Last Dose Status Informant  acetaminophen (TYLENOL) 500 MG tablet [id number]  Take 2 tablets (1,000 mg total) by mouth every 8 (eight) hours as needed for mild pain.  Patient not taking: Reported on 05/10/2022   Azzie Glatter, FNP  Active Self  albuterol (VENTOLIN HFA) 108 (90 Base) MCG/ACT inhaler [id number]  Inhale 2 puffs into the lungs every 4 (four) hours as needed for wheezing or shortness of breath. Azzie Glatter, FNP  Active Self  amLODipine (NORVASC) 10 MG  tablet [id number] Yes Take 10 mg by mouth daily. [provider] Taking Active   B-D ULTRAFINE III SHORT PEN 31G X 8 MM MISC [id number] No See admin instructions.  Patient not taking: Reported on 05/25/2022   [provider] Not Taking Active Self  blood glucose meter kit and supplies [id number] Yes Dispense based on patient and insurance preference. Use up to four times daily as directed. (FOR ICD-9 250.00, 250.01). Azzie Glatter, FNP Taking Active Self  carvedilol (COREG) 25 MG tablet [id number] Yes Take 1 tablet (25 mg total) by mouth 2 (two) times daily. Berniece Salines, DO Taking Active   cloNIDine (CATAPRES) 0.2 MG tablet [id number] No Take 1 tablet (0.2 mg total) by mouth at bedtime.  Patient not taking: Reported on 05/25/2022   Berniece Salines, DO Not Taking Active   cloNIDine (CATAPRES) tablet 0.2 mg [id number]   Tobb, Kardie, DO  Active   furosemide (LASIX) 40 MG tablet [id number] Yes Take 1 tablet (40 mg total) by mouth daily. Tobb, Kardie, DO Taking Active   hydrochlorothiazide (HYDRODIURIL) 25 MG tablet [id number] Yes Take 25 mg by mouth daily. [provider] Taking Active   insulin aspart protamine- aspart (NOVOLOG MIX 70/30) (70-30) 100 UNIT/ML injection [id number] No Inject 0.6 mLs (60 Units total) into the skin 2 (two) times daily with a meal.  Patient not taking: Reported on 05/10/2022   Azzie Glatter, FNP Not Taking Active Self  Insulin Syringe-Needle U-100 (INSULIN SYRINGE .3CC/31GX5/16") 31G X 5/16" 0.3 ML MISC [id number]  Use with insulin, twice daily  Patient not taking: Reported on 05/10/2022   Rai, Vernelle Emerald, MD  Active Self  metFORMIN (GLUCOPHAGE-XR) 500 MG 24 hr tablet [id number] Yes Take 2 tablets (1,000 mg total) by mouth in the morning and at bedtime. Fenton Foy, NP Taking Active   nicotine (NICODERM CQ - DOSED IN MG/24 HOURS) 14 mg/24hr patch [id number] Yes Place 1 patch (14 mg total) onto the skin daily. Tobb, Kardie, DO Taking Active   rosuvastatin (CRESTOR) 10 MG tablet [id number]  Take 1 tablet (10 mg total) by mouth daily. Tobb, Kardie, DO  Expired 04/20/22 2359   Semaglutide,0.25 or 0.5MG/DOS, (OZEMPIC, 0.25 OR 0.5 MG/DOSE,) 2 MG/3ML SOPN [id number] Yes Inject 0.25 mg into the skin once a week. Fenton Foy, NP Taking Active   spironolactone (ALDACTONE) 25 MG tablet [id number] Yes Take 1 tablet (25 mg total) by mouth daily. Tobb, Kardie, DO Taking Active   valsartan (DIOVAN) 320 MG  tablet [id number] Yes Take 320 mg by mouth daily. [provider] Taking Active               Assessment/Plan:   Diabetes: - Currently uncontrolled, working towards medication adherence. Currently using her mother's Ozempic.  - Long term, agree with Ozempic therapy due to greater benefit. In short term, can access Trulicity through [facility] while working towards [employer] patient assistance approval.  - Moving forward, will plan to increase metformin dose as tolerated.   Hyperlipidemia/ASCVD Risk Reduction: - Currently uncontrolled due to not having medication - Discussed with Dr. Harriet Masson. Refill placed under her.   Heart Failure: - Currently opportunity for optimization - Reviewed medications. Patient needs refill on valsartan. Discussed with Dr. Harriet Masson, refill placed under her. Patient will follow up with Dr. Oval Linsey in 2 weeks.    Follow Up Plan: phone call in 3 weeks  Catie TJodi Mourning, PharmD, [facility] [phone]

## 2022-07-07 ENCOUNTER — Other Ambulatory Visit: Payer: Self-pay

## 2022-07-07 NOTE — Progress Notes (Signed)
Patient's appointment was rescheduled to 07/13/2022 at 2:00pm. Patient was sent a MyChart message with the updated appointment date and time.   Makensie Mulhall [facility] Guide, Health Coach [address], [address] [place] [address] Telephone: [phone] Email: [email or web address]

## 2022-07-09 ENCOUNTER — Other Ambulatory Visit: Payer: Self-pay

## 2022-07-09 ENCOUNTER — Encounter (HOSPITAL_BASED_OUTPATIENT_CLINIC_OR_DEPARTMENT_OTHER): Payer: Self-pay | Admitting: Emergency Medicine

## 2022-07-09 DIAGNOSIS — R519 Headache, unspecified: Secondary | ICD-10-CM | POA: Diagnosis not present

## 2022-07-09 DIAGNOSIS — Z79899 Other long term (current) drug therapy: Secondary | ICD-10-CM | POA: Insufficient documentation

## 2022-07-09 DIAGNOSIS — A599 Trichomoniasis, unspecified: Secondary | ICD-10-CM | POA: Diagnosis not present

## 2022-07-09 DIAGNOSIS — R531 Weakness: Secondary | ICD-10-CM | POA: Diagnosis not present

## 2022-07-09 DIAGNOSIS — H538 Other visual disturbances: Secondary | ICD-10-CM | POA: Insufficient documentation

## 2022-07-09 DIAGNOSIS — G459 Transient cerebral ischemic attack, unspecified: Secondary | ICD-10-CM | POA: Insufficient documentation

## 2022-07-09 DIAGNOSIS — R209 Unspecified disturbances of skin sensation: Secondary | ICD-10-CM | POA: Diagnosis present

## 2022-07-09 DIAGNOSIS — Z7984 Long term (current) use of oral hypoglycemic drugs: Secondary | ICD-10-CM | POA: Insufficient documentation

## 2022-07-09 DIAGNOSIS — E119 Type 2 diabetes mellitus without complications: Secondary | ICD-10-CM | POA: Insufficient documentation

## 2022-07-09 DIAGNOSIS — I10 Essential (primary) hypertension: Secondary | ICD-10-CM | POA: Insufficient documentation

## 2022-07-09 DIAGNOSIS — R42 Dizziness and giddiness: Secondary | ICD-10-CM | POA: Insufficient documentation

## 2022-07-09 NOTE — ED Triage Notes (Addendum)
Pt reports BUE tingling and numb, CP and abd pain x over 1 week; was seen at [facility] for same x 2

## 2022-07-10 ENCOUNTER — Emergency Department (HOSPITAL_BASED_OUTPATIENT_CLINIC_OR_DEPARTMENT_OTHER): Payer: PRIVATE HEALTH INSURANCE

## 2022-07-10 ENCOUNTER — Emergency Department (HOSPITAL_BASED_OUTPATIENT_CLINIC_OR_DEPARTMENT_OTHER)
Admission: EM | Admit: 2022-07-10 | Discharge: 2022-07-10 | Disposition: A | Discharge status: Home / Self Care | Payer: PRIVATE HEALTH INSURANCE | Attending: Emergency Medicine | Admitting: Emergency Medicine

## 2022-07-10 DIAGNOSIS — A599 Trichomoniasis, unspecified: Secondary | ICD-10-CM

## 2022-07-10 DIAGNOSIS — G459 Transient cerebral ischemic attack, unspecified: Secondary | ICD-10-CM

## 2022-07-10 DIAGNOSIS — I10 Essential (primary) hypertension: Secondary | ICD-10-CM

## 2022-07-10 LAB — CBC WITH DIFFERENTIAL/PLATELET
Abs Immature Granulocytes: 0.05 10*3/uL (ref 0.00–0.07)
Basophils Absolute: 0.1 10*3/uL (ref 0.0–0.1)
Basophils Relative: 0 %
Eosinophils Absolute: 0.1 10*3/uL (ref 0.0–0.5)
Eosinophils Relative: 1 %
HCT: 38.2 % (ref 36.0–46.0)
Hemoglobin: 12.4 g/dL (ref 12.0–15.0)
Immature Granulocytes: 0 %
Lymphocytes Relative: 26 %
Lymphs Abs: 3.4 10*3/uL (ref 0.7–4.0)
MCH: 27 pg (ref 26.0–34.0)
MCHC: 32.5 g/dL (ref 30.0–36.0)
MCV: 83 fL (ref 80.0–100.0)
Monocytes Absolute: 1 10*3/uL (ref 0.1–1.0)
Monocytes Relative: 8 %
Neutro Abs: 8.5 10*3/uL — ABNORMAL HIGH (ref 1.7–7.7)
Neutrophils Relative %: 65 %
Platelets: 277 10*3/uL (ref 150–400)
RBC: 4.6 MIL/uL (ref 3.87–5.11)
RDW: 13.6 % (ref 11.5–15.5)
WBC: 13.1 10*3/uL — ABNORMAL HIGH (ref 4.0–10.5)
nRBC: 0 % (ref 0.0–0.2)

## 2022-07-10 LAB — URINALYSIS, ROUTINE W REFLEX MICROSCOPIC
Bilirubin Urine: NEGATIVE
Glucose, UA: >=500 mg/dL — AB
Ketones, ur: NEGATIVE mg/dL
Leukocytes,Ua: NEGATIVE
Nitrite: NEGATIVE
Protein, ur: >=300 mg/dL — AB
Specific Gravity, Urine: 1.015 (ref 1.005–1.030)
pH: 6 (ref 5.0–8.0)

## 2022-07-10 LAB — COMPREHENSIVE METABOLIC PANEL
ALT: 13 U/L (ref 0–44)
AST: 17 U/L (ref 15–41)
Albumin: 3.1 g/dL — ABNORMAL LOW (ref 3.5–5.0)
Alkaline Phosphatase: 86 U/L (ref 38–126)
Anion gap: 6 (ref 5–15)
BUN: 17 mg/dL (ref 6–20)
CO2: 23 mmol/L (ref 22–32)
Calcium: 8.8 mg/dL — ABNORMAL LOW (ref 8.9–10.3)
Chloride: 102 mmol/L (ref 98–111)
Creatinine, Ser: 0.79 mg/dL (ref 0.44–1.00)
GFR, Estimated: >60 mL/min (ref >60)
Glucose, Bld: 373 mg/dL — ABNORMAL HIGH (ref 70–99)
Potassium: 3.7 mmol/L (ref 3.5–5.1)
Sodium: 131 mmol/L — ABNORMAL LOW (ref 135–145)
Total Bilirubin: 0.3 mg/dL (ref 0.3–1.2)
Total Protein: 7 g/dL (ref 6.5–8.1)

## 2022-07-10 LAB — URINALYSIS, MICROSCOPIC (REFLEX)

## 2022-07-10 LAB — LIPASE, BLOOD: Lipase: 89 U/L — ABNORMAL HIGH (ref 11–51)

## 2022-07-10 LAB — TROPONIN I (HIGH SENSITIVITY): Troponin I (High Sensitivity): 10 ng/L (ref <18)

## 2022-07-10 LAB — PREGNANCY, URINE: Preg Test, Ur: NEGATIVE

## 2022-07-10 MED ORDER — METRONIDAZOLE 500 MG PO TABS
2000.0000 mg | ORAL_TABLET | Freq: Once | ORAL | Status: AC
  Administered 2022-07-10: 2000 mg via ORAL
  Filled 2022-07-10: qty 4

## 2022-07-10 MED ORDER — ACETAMINOPHEN 325 MG PO TABS
650.0000 mg | ORAL_TABLET | Freq: Once | ORAL | Status: AC
  Administered 2022-07-10: 650 mg via ORAL
  Filled 2022-07-10: qty 2

## 2022-07-10 NOTE — ED Provider Notes (Signed)
[facility] EMERGENCY DEPARTMENT  Provider Note  CSN: [id number] Arrival date & time: 07/09/22 2058  History Chief Complaint  Patient presents with   Tingling    Carolyn Zimmerman is a 36 y.o. female with history of poorly controlled HTN and DM reports about 2 weeks of intermittent episodes of numbness and weakness primarily affecting her R arm, happens sometimes while she's at work to the point where she cannot lift her arm or use her hand. She reports symptoms last for a few minutes then resolve and in between she feels fine. She had some tingling in her foot at one point too, but that is not as persistent. She denies any facial droop or slurred speech during those episodes. She was seen at [facility] on 10/11 for headache, dizziness and blurred vision. No mention of arm weakness/numbness then but she ended leaving AMA before getting a CT of her head. She had another episode at work on 10/20, taken by EMS to [facility] again, but she reports she 'had a bad interaction in triage' and just left before having any testing done. She reports symptoms have continued to happen to the point she had to leave work today.    Home Medications Prior to Admission medications   Medication Sig Start Date End Date Taking? Authorizing Provider  acetaminophen (TYLENOL) 500 MG tablet Take 2 tablets (1,000 mg total) by mouth every 8 (eight) hours as needed for mild pain. Patient not taking: Reported on 05/10/2022 06/02/20   Kallie Locks, FNP  albuterol (VENTOLIN HFA) 108 (90 Base) MCG/ACT inhaler Inhale 2 puffs into the lungs every 4 (four) hours as needed for wheezing or shortness of breath. 06/02/20   Kallie Locks, FNP  amLODipine (NORVASC) 10 MG tablet Take 1 tablet (10 mg total) by mouth daily. 05/25/22   Tobb, Kardie, DO  blood glucose meter kit and supplies Dispense based on patient and insurance preference. Use up to four times daily as directed. (FOR ICD-9 250.00, 250.01). 06/02/20   Kallie Locks, FNP   carvedilol (COREG) 25 MG tablet Take 1 tablet (25 mg total) by mouth 2 (two) times daily. 11/25/21   Tobb, Kardie, DO  Dulaglutide (TRULICITY) 0.75 MG/0.5ML SOPN Inject 0.75 mg into the skin once a week. 05/26/22   Ivonne Andrew, NP  hydrochlorothiazide (HYDRODIURIL) 25 MG tablet Take 25 mg by mouth daily.    [provider]  metFORMIN (GLUCOPHAGE-XR) 500 MG 24 hr tablet Take 2 tablets (1,000 mg total) by mouth in the morning and at bedtime. 05/10/22   Ivonne Andrew, NP  nicotine (NICODERM CQ - DOSED IN MG/24 HOURS) 14 mg/24hr patch Place 1 patch (14 mg total) onto the skin daily. 05/18/22   Tobb, Kardie, DO  nicotine polacrilex (NICORETTE) 4 MG gum Take 1 each (4 mg total) by mouth as needed for smoking cessation. Use park and chew method as instructed. 05/26/22   Tobb, Kardie, DO  rosuvastatin (CRESTOR) 10 MG tablet Take 1 tablet (10 mg total) by mouth daily. 07/06/22   Tobb, Kardie, DO  Semaglutide, 1 MG/DOSE, 4 MG/3ML SOPN Inject 1 mg as directed once a week. 05/26/22   Ivonne Andrew, NP  spironolactone (ALDACTONE) 25 MG tablet Take 1 tablet (25 mg total) by mouth daily. 04/20/22   Tobb, Kardie, DO  valsartan (DIOVAN) 320 MG tablet Take 1 tablet (320 mg total) by mouth daily. 07/06/22   Tobb, Kardie, DO     Allergies    Contrast media [iodinated  contrast media]   Review of Systems   Review of Systems Please see HPI for pertinent positives and negatives  Physical Exam BP (!) 142/81   Pulse 78   Temp 98.4 F (36.9 C) (Oral)   Resp 18   Ht 5' 7.5" (1.715 m)   Wt 124.3 kg   LMP 06/25/2022   SpO2 99%   BMI 42.28 kg/m   Physical Exam Vitals and nursing note reviewed.  Constitutional:      Appearance: Normal appearance.  HENT:     Head: Normocephalic and atraumatic.     Nose: Nose normal.     Mouth/Throat:     Mouth: Mucous membranes are moist.  Eyes:     Extraocular Movements: Extraocular movements intact.     Conjunctiva/sclera: Conjunctivae normal.   Cardiovascular:     Rate and Rhythm: Normal rate.  Pulmonary:     Effort: Pulmonary effort is normal.     Breath sounds: Normal breath sounds.  Abdominal:     General: Abdomen is flat.     Palpations: Abdomen is soft.     Tenderness: There is no abdominal tenderness.  Musculoskeletal:        General: No swelling. Normal range of motion.     Cervical back: Neck supple.  Skin:    General: Skin is warm and dry.  Neurological:     General: No focal deficit present.     Mental Status: She is alert and oriented to person, place, and time.     Cranial Nerves: No cranial nerve deficit.     Sensory: No sensory deficit.     Motor: No weakness.     Coordination: Coordination normal.     Gait: Gait normal.  Psychiatric:        Mood and Affect: Mood normal.     ED Results / Procedures / Treatments   EKG EKG Interpretation  Date/Time:  Monday July 10 2022 01:14:48 EDT Ventricular Rate:  80 PR Interval:  183 QRS Duration: 113 QT Interval:  409 QTC Calculation: 472 R Axis:   39 Text Interpretation: Sinus rhythm Probable left atrial enlargement Borderline intraventricular conduction delay Abnormal T, consider ischemia, lateral leads Since last tracing prior T wave inversions are improved In lateral leads Confirmed by Calvert Cantor [phone]) on 07/10/2022 1:28:06 AM  Procedures Procedures  Medications Ordered in the ED Medications  metroNIDAZOLE (FLAGYL) tablet 2,000 mg (2,000 mg Oral Given 07/10/22 0356)  acetaminophen (TYLENOL) tablet 650 mg (650 mg Oral Given 07/10/22 0356)    Initial Impression and Plan  Patient here with intermittent RUE numbness and weakness, on going for at least 2 weeks. Has had two prior ED visits at [facility], but has left AMA both times. Her symptoms are concerning for TIA. She has history of poorly controlled HTN and DM so she has some risk factors despite her relative young age. Will check labs, send for CT, discussed with her likely will need admission  to complete her workup and she is amenable at this time.   ED Course   Clinical Course as of 07/10/22 0540  Mon Jul 10, 2022  0219 CBC with mild leukocytotis, CMP with hyperglycemia but no signs of DKA. Trop is normal.  [CS]  [id number] I personally viewed the images from radiology studies and agree with radiologist interpretation:  CT head is negative.   UA is positive for trichomonas. She reports she has been treated twice previously for this and has not had any recent unprotected sex. Will  treat with flagyl, she declines further STI testing, recommend OB/Gyn follow up.   I again discussed admission to complete her TIA workup, but states she no longer wants to stay. She understands the risks of leaving including stroke with long term deficits. She will call her PCP in the AM to try to get an appointment soon, will also give her referral to Neurology. She was encouraged to return to the ED, preferably [facility] or [facility] where there is MRI capabilities if her symptoms worsen or she has any other concerns.  [CS]    Clinical Course User Index [CS] Truddie Hidden, MD     MDM Rules/Calculators/A&P Medical Decision Making Problems Addressed: TIA (transient ischemic attack): acute illness or injury Trichomonas infection: chronic illness or injury with exacerbation, progression, or side effects of treatment Uncontrolled hypertension: chronic illness or injury with exacerbation, progression, or side effects of treatment  Amount and/or Complexity of Data Reviewed Labs: ordered. Decision-making details documented in ED Course. Radiology: ordered and independent interpretation performed. Decision-making details documented in ED Course. ECG/medicine tests: ordered and independent interpretation performed. Decision-making details documented in ED Course.  Risk OTC drugs. Prescription drug management.    Final Clinical Impression(s) / ED Diagnoses Final diagnoses:  TIA (transient ischemic attack)   Uncontrolled hypertension  Trichomonas infection    Rx / DC Orders ED Discharge Orders          Ordered    Ambulatory referral to Neurology       Comments: An appointment is requested in approximately: 1 week   07/10/22 0346             Truddie Hidden, MD 07/10/22 [phone]

## 2022-07-11 ENCOUNTER — Other Ambulatory Visit: Payer: Self-pay

## 2022-07-11 ENCOUNTER — Encounter (HOSPITAL_COMMUNITY): Payer: Self-pay

## 2022-07-11 ENCOUNTER — Emergency Department (HOSPITAL_COMMUNITY)
Admission: EM | Admit: 2022-07-11 | Discharge: 2022-07-11 | Discharge status: Against Medical Advice | Payer: PRIVATE HEALTH INSURANCE | Attending: Emergency Medicine | Admitting: Emergency Medicine

## 2022-07-11 DIAGNOSIS — R531 Weakness: Secondary | ICD-10-CM | POA: Insufficient documentation

## 2022-07-11 DIAGNOSIS — I10 Essential (primary) hypertension: Secondary | ICD-10-CM | POA: Diagnosis not present

## 2022-07-11 DIAGNOSIS — R519 Headache, unspecified: Secondary | ICD-10-CM | POA: Insufficient documentation

## 2022-07-11 DIAGNOSIS — Z5321 Procedure and treatment not carried out due to patient leaving prior to being seen by health care provider: Secondary | ICD-10-CM | POA: Insufficient documentation

## 2022-07-11 NOTE — ED Triage Notes (Addendum)
Pt arrived to ED via [facility] w/ c/o R arm weakness onset 1430 lasting until 1730 and HTN. Pt is not compliant w/ medications and reports neurology is concerned for TIA's. EMS reports after they spoke w/ pt to calm her down d/t her anxiety, the R arm weakness fully resolved. 4mg  zofran given IV. Pt c/o headache. Initial BP 202/126, last BP 167/100. Pt in NAD at this time and talking on her phone. Pt reports she hasn't taken her meds x 2 days.

## 2022-07-11 NOTE — ED Notes (Signed)
Pt stated "this is the worst experience I have had here. Everyone keeps walking to other rooms and not coming to see if I'm okay. Someone take this IV out or I'm ripping it out". Pt very frustrated that no one has come to "check on her" and that the nurse is " racist and won't help me". MD at bedside at this time and aware.

## 2022-07-11 NOTE — ED Notes (Signed)
Pt stating she wants to leave AMA. Provider spoke to pt and discussed risks of leaving AMA. Pt AAOx4 and stated understanding of risks of leaving AMA.

## 2022-07-11 NOTE — ED Notes (Signed)
Pt signed [place] paperwork. Pt is AAOx4. Pt in no acute distress. Pt ambulatory.

## 2022-07-12 ENCOUNTER — Other Ambulatory Visit (HOSPITAL_COMMUNITY): Payer: Self-pay

## 2022-07-12 DIAGNOSIS — K859 Acute pancreatitis without necrosis or infection, unspecified: Secondary | ICD-10-CM | POA: Insufficient documentation

## 2022-07-13 ENCOUNTER — Telehealth: Payer: Self-pay

## 2022-07-13 ENCOUNTER — Ambulatory Visit: Payer: PRIVATE HEALTH INSURANCE

## 2022-07-13 DIAGNOSIS — Z0000 Encounter for general adult medical examination without abnormal findings: Secondary | ICD-10-CM

## 2022-07-13 NOTE — Telephone Encounter (Signed)
Called patient to hold health coaching session over the phone. Patient is currently admitted to ED. Patient will call to reschedule when she returns home.   Russell Quinney [facility] Guide, Health Coach [address], [address] [place] [address] Telephone: [phone] Email: [email or web address]

## 2022-07-14 ENCOUNTER — Telehealth: Payer: Self-pay | Admitting: Licensed Clinical Social Worker

## 2022-07-14 DIAGNOSIS — I639 Cerebral infarction, unspecified: Secondary | ICD-10-CM

## 2022-07-14 HISTORY — DX: Cerebral infarction, unspecified: I63.9

## 2022-07-14 NOTE — Telephone Encounter (Addendum)
H&V Care Navigation CSW Progress Note  1:50pm- pt shares she has been transferred to [facility], still under medical care at this time. Let pt know there is no rush on paperwork, I remain available.   1:25pm- Clinical Social Worker  was contacted back by pt  [phone]) to let me know that "I was admitted to the hospital after having sole (sp?) strokes." I shared that I was sorry to hear that, my colleague had informed me of such and inquired if pt was still admitted per Care Everywhere appears pt may have discharged, but they may just not have updated yet at this time.    Patient is participating in a Managed Medicaid Plan: no, generic commercial plan w/ no pharmacy benefits   [employer]: No Food Insecurity (04/28/2022)  Housing: Low Risk  (04/28/2022)  Transportation Needs: No Transportation Needs (04/28/2022)  Depression (PHQ2-9): Low Risk  (08/03/2020)  Financial Resource Strain: Medium Risk (04/28/2022)  Stress: Stress Concern Present (04/28/2022)  Tobacco Use: Medium Risk (07/11/2022)     Westley Hummer, MSW, [place]  [phone]- work cell phone (preferred) [phone]- desk phone

## 2022-07-14 NOTE — Telephone Encounter (Addendum)
H&V Care Navigation CSW Progress Note  Clinical Social Worker contacted patient by phone to f/u on [employer] application that was remailed to her. Sent text message as I am aware pt was in ED yesterday, per notes has possibly discharged. Upcoming appts with Dr. Oval Linsey, PCP, and PharmD. No answer at this time.   Patient is participating in a Managed Medicaid Plan:  No, Generic Commercial plan w/ no pharmacy benefits.   SDOH Screenings   Food Insecurity: No Food Insecurity (04/28/2022)  Housing: Low Risk  (04/28/2022)  Transportation Needs: No Transportation Needs (04/28/2022)  Depression (PHQ2-9): Low Risk  (08/03/2020)  Financial Resource Strain: Medium Risk (04/28/2022)  Stress: Stress Concern Present (04/28/2022)  Tobacco Use: Medium Risk (07/11/2022)   Westley Hummer, MSW, [place]  [phone]- work cell phone (preferred) [phone]- desk phone

## 2022-07-18 ENCOUNTER — Ambulatory Visit (HOSPITAL_BASED_OUTPATIENT_CLINIC_OR_DEPARTMENT_OTHER): Payer: PRIVATE HEALTH INSURANCE | Admitting: Cardiovascular Disease

## 2022-07-20 ENCOUNTER — Ambulatory Visit: Payer: Self-pay | Admitting: Nurse Practitioner

## 2022-07-20 ENCOUNTER — Telehealth: Payer: Self-pay | Admitting: Pharmacist

## 2022-07-20 ENCOUNTER — Other Ambulatory Visit: Payer: PRIVATE HEALTH INSURANCE | Admitting: Pharmacist

## 2022-07-20 ENCOUNTER — Telehealth: Payer: Self-pay | Admitting: Clinical

## 2022-07-20 NOTE — Telephone Encounter (Signed)
Integrated Behavioral Health Case Management Referral Note  07/20/2022 Name: Carolyn Zimmerman MRN: [id number] DOB: 12-22-1985 [name] is a 36 y.o. year old female who sees Fenton Foy, NP for primary care. LCSW was consulted to assess patient's needs and assist the patient with financial difficulties.  Interpreter: No.   Interpreter Name & Language: none  Assessment: Patient experiencing financial constraints related to inadequate health coverage.  Intervention: Called patient to follow up; [facility] hasn't been able to reach patient lately. Reviewed MedAssist application with patient and how to apply online, as patient unable to come into the office to complete it in person. Emailed patient application information.  Review of patient status, including review of consultants reports, relevant laboratory and other test results, and collaboration with appropriate care team members and the patient's provider was performed as part of comprehensive patient evaluation and provision of services.    Estanislado Emms, [facility] [phone]

## 2022-07-20 NOTE — Progress Notes (Signed)
Attempted to contact patient for scheduled appointment for medication management. Appears patient also missed her hospital follow up appointment with PCP earlier this morning   Left HIPAA compliant message for patient to return my call at their convenience.   Catie Hedwig Morton, PharmD, [facility] [phone]

## 2022-07-25 ENCOUNTER — Other Ambulatory Visit: Payer: Self-pay

## 2022-07-27 ENCOUNTER — Telehealth: Payer: Self-pay

## 2022-07-27 ENCOUNTER — Other Ambulatory Visit: Payer: PRIVATE HEALTH INSURANCE | Admitting: Pharmacist

## 2022-07-27 NOTE — Progress Notes (Signed)
   Care Guide Note  07/27/2022 Name: Carolyn Zimmerman MRN: [id number] DOB: 06-09-1986  Referred by: Ivonne Andrew, NP Reason for referral : Care Coordination (Outreach to reschedule missed f/u with Pharm D Catie )   [name] is a 36 y.o. year old female who is a primary care patient of Ivonne Andrew, NP. Makayla Gut was referred to the pharmacist for assistance related to DM.    An unsuccessful telephone outreach was attempted today to contact the patient who was referred to the pharmacy team for assistance with medication management. Additional attempts will be made to contact the patient.   Penne Lash, RMA Care Guide [facility] [place], [place] [address] Direct Dial: [phone] [email or web address]

## 2022-08-11 ENCOUNTER — Other Ambulatory Visit: Payer: Self-pay

## 2022-08-14 ENCOUNTER — Telehealth: Payer: Self-pay

## 2022-08-14 NOTE — Progress Notes (Signed)
   Care Guide Note  08/14/2022 Name: Carolyn Zimmerman MRN: [id number] DOB: [birth year]  Referred by: Ivonne Andrew, NP Reason for referral : Care Coordination (Outreach to reschedule f/u with Pharm d )   Carolyn Zimmerman is a 36 y.o. year old female who is a primary care patient of Ivonne Andrew, NP. Carolyn Zimmerman was referred to the pharmacist for assistance related to DM.    An unsuccessful telephone outreach was attempted today to contact the patient who was referred to the pharmacy team for assistance with medication management. Additional attempts will be made to contact the patient.   Penne Lash, RMA Care Guide [facility]  [place], [place] [address] Direct Dial: [phone] [email or web address]

## 2022-08-16 ENCOUNTER — Other Ambulatory Visit: Payer: Self-pay

## 2022-08-18 ENCOUNTER — Telehealth: Payer: Self-pay

## 2022-08-18 ENCOUNTER — Other Ambulatory Visit: Payer: Self-pay

## 2022-08-18 NOTE — Telephone Encounter (Signed)
Ozempic PAP stock has been received and MyChart message sent to patient.  Trulicity discontinued at pharmacy.

## 2022-08-21 ENCOUNTER — Other Ambulatory Visit: Payer: Self-pay

## 2022-08-21 ENCOUNTER — Other Ambulatory Visit: Payer: Self-pay | Admitting: Cardiology

## 2022-08-21 MED ORDER — HYDROCHLOROTHIAZIDE 25 MG PO TABS
25.0000 mg | ORAL_TABLET | Freq: Every day | ORAL | 0 refills | Status: DC
  Filled 2022-08-21: qty 90, 90d supply, fill #0

## 2022-08-25 ENCOUNTER — Other Ambulatory Visit: Payer: Self-pay | Admitting: Cardiology

## 2022-08-25 ENCOUNTER — Other Ambulatory Visit: Payer: Self-pay

## 2022-08-31 ENCOUNTER — Other Ambulatory Visit: Payer: PRIVATE HEALTH INSURANCE | Admitting: Pharmacist

## 2022-09-19 ENCOUNTER — Other Ambulatory Visit: Payer: Self-pay

## 2022-09-28 NOTE — Progress Notes (Signed)
   Care Guide Note  09/28/2022 Name: Georgeana Oertel MRN: [id number] DOB: 1986-01-17  Referred by: Fenton Foy, NP Reason for referral : Care Coordination (Outreach to reschedule f/u with Pharm d )   Takita Riecke is a 37 y.o. year old female who is a primary care patient of Fenton Foy, NP. Danikah Fitzwater was referred to the pharmacist for assistance related to DM.    A second unsuccessful telephone outreach was attempted today to contact the patient who was referred to the pharmacy team for assistance with medication management. Additional attempts will be made to contact the patient.  Noreene Larsson, [place], [place] [address] Direct Dial: [phone] [email or web address]

## 2022-10-04 NOTE — Progress Notes (Signed)
   Care Guide Note  10/04/2022 Name: Carolyn Zimmerman MRN: [id number] DOB: 04/06/1986  Referred by: Fenton Foy, NP Reason for referral : Care Coordination (Outreach to reschedule f/u with Pharm d )   Carolyn Zimmerman is a 37 y.o. year old female who is a primary care patient of Fenton Foy, NP. Carolyn Zimmerman was referred to the pharmacist for assistance related to DM.    Successful contact was made with the patient to discuss pharmacy services including being ready for the pharmacist to call at least 5 minutes before the scheduled appointment time, to have medication bottles and any blood sugar or blood pressure readings ready for review. The patient agreed to meet with the pharmacist via with the pharmacist via telephone visit on (date/time).  10/12/2022  Carolyn Zimmerman, Carolyn Zimmerman, Carolyn Zimmerman [address] Direct Dial: [phone] [email or web address]

## 2022-10-12 ENCOUNTER — Other Ambulatory Visit: Payer: PRIVATE HEALTH INSURANCE | Admitting: Pharmacist

## 2022-10-12 DIAGNOSIS — I10 Essential (primary) hypertension: Secondary | ICD-10-CM

## 2022-10-12 DIAGNOSIS — Z794 Long term (current) use of insulin: Secondary | ICD-10-CM

## 2022-10-13 ENCOUNTER — Encounter: Payer: Self-pay | Admitting: Pharmacist

## 2022-10-16 NOTE — Patient Instructions (Signed)
Carolyn Zimmerman,   It was great talking to you today!  I will talk with Kenney Houseman about the medications you need refills on.   Thanks!  Catie Hedwig Morton, PharmD, [place], [facility] [phone]

## 2022-10-16 NOTE — Progress Notes (Signed)
10/16/2022 Name: Carolyn Zimmerman MRN: [id number] DOB: 1985/10/25  Chief Complaint  Patient presents with   Medication Management   Diabetes   Hypertension    Carolyn Zimmerman is a 37 y.o. year old female who presented for a telephone visit.   They were referred to the pharmacist by their PCP for assistance in managing complex medication management.    Subjective:  Care Team: Primary Care Provider: Fenton Foy, NP ; Next Scheduled Visit: 10/19/22  Medication Access/Adherence  Current Pharmacy:  [facility], [place] [address] Phone: [phone] Fax: [id number] - [place] - [address] AT [place] [address] [place] [address] Phone: [phone] Fax: [phone]   Patient reports affordability concerns with their medications: No  Patient reports access/transportation concerns to their pharmacy: Yes  Patient reports adherence concerns with their medications:  Yes    Reports she has been overwhelmed since her hospitalizations in October. Has not been seen by cardiology, endocrinology, or primary care.    Diabetes:  Current medications: metformin XR 1000 daily - though has not been taking lately; Trulicity - has not been taking lately; discharged on Lantus 25 units daily and Humalog 10 units TID with sliding scale, but has not been using either  Diagnosis of pancreatitis with most recent admission. S/p prior cholecystectomy; TG were relatively well controlled, no current alcohol use  Current glucose readings: fastings 170-200; afternoon readings 165-170s  Referred to endocrinology, has not followed up  Hyperlipidemia/ASCVD Risk Reduction  Current lipid lowering medications: discharged on atorvastatin 40 mg daily but has still been taking rosuvastatin 10 mg daily  Antiplatelet regimen: aspirin 81 mg daily and  clopidogrel 75 mg daily; per discharge summary, patient to end clopidogrel 10/13/22  Heart Failure:  Current medications:  ACEi/ARB/ARNI: valsartan 320 mg daily SGLT2i: none Beta blocker: carvedilol 12.5 mg twice daily,  Mineralocorticoid Receptor Antagonist: spironolactone 50 mg daily - patient does not have Diuretic regimen: furosemide 20 mg daily Additional antihypertensive: amlodipine 10 mg daily  Objective:  Lab Results  Component Value Date   HGBA1C 12.5 (A) 05/10/2022   HGBA1C 12.5 05/10/2022   HGBA1C 12.5 (A) 05/10/2022   HGBA1C 12.5 (A) 05/10/2022    Lab Results  Component Value Date   CREATININE 0.79 07/10/2022   BUN 17 07/10/2022   NA 131 (L) 07/10/2022   K 3.7 07/10/2022   CL 102 07/10/2022   CO2 23 07/10/2022    Lab Results  Component Value Date   CHOL 172 12/29/2021   HDL 34 (L) 12/29/2021   LDLCALC 100 (H) 12/29/2021   TRIG 219 (H) 12/29/2021   CHOLHDL 5.1 (H) 12/29/2021    Medications Reviewed Today     Reviewed by Osker Mason, RPH-CPP (Pharmacist) on 10/12/22 at 31  Med List Status: <None>   Medication Order Taking? Sig Documenting Provider Last Dose Status Informant  acetaminophen (TYLENOL) 500 MG tablet [id number]  Take 2 tablets (1,000 mg total) by mouth every 8 (eight) hours as needed for mild pain.  Patient not taking: Reported on 05/10/2022   Azzie Glatter, FNP  Active Self  albuterol (VENTOLIN HFA) 108 (90 Base) MCG/ACT inhaler [id number]  Inhale 2 puffs into the lungs every 4 (four) hours as needed for wheezing or shortness of breath. Azzie Glatter, FNP  Active Self  amLODipine (NORVASC) 10 MG tablet [id number] Yes  Take 1 tablet (10 mg total) by mouth daily. Berniece Salines, DO Taking Active   blood glucose meter kit and supplies [id number]  Dispense based on patient and insurance preference. Use up to four times daily as directed. (FOR ICD-9 250.00, 250.01). Azzie Glatter, FNP  Active Self  carvedilol (COREG) 25 MG tablet  [id number] Yes Take 1 tablet (25 mg total) by mouth 2 (two) times daily. Berniece Salines, DO Taking Active   cloNIDine (CATAPRES) tablet 0.2 mg [id number]   Tobb, Kardie, DO  Active   hydrochlorothiazide (HYDRODIURIL) 25 MG tablet [id number]  Take 1 tablet (25 mg total) by mouth daily. Fenton Foy, NP  Active   metFORMIN (GLUCOPHAGE-XR) 500 MG 24 hr tablet [id number] No Take 2 tablets (1,000 mg total) by mouth in the morning and at bedtime.  Patient not taking: Reported on 10/12/2022   Fenton Foy, NP Not Taking Active   nicotine (NICODERM CQ - DOSED IN MG/24 HOURS) 14 mg/24hr patch [id number] Yes APPLY 1 PATCH(14 MG) TOPICALLY TO THE SKIN DAILY Tobb, Kardie, DO Taking Active   nicotine polacrilex (NICORETTE) 4 MG gum [id number] No Take 1 each (4 mg total) by mouth as needed for smoking cessation. Use park and chew method as instructed.  Patient not taking: Reported on 10/12/2022   Berniece Salines, DO Not Taking Active   rosuvastatin (CRESTOR) 10 MG tablet [id number] Yes Take 1 tablet (10 mg total) by mouth daily. Berniece Salines, DO Taking Active   Semaglutide, 1 MG/DOSE, 4 MG/3ML SOPN [id number] No Inject 1 mg as directed once a week.  Patient not taking: Reported on 10/12/2022   Fenton Foy, NP Not Taking Active            Med Note Jodi Mourning, Tillie Fantasia Oct 12, 2022  2:05 PM)    spironolactone (ALDACTONE) 25 MG tablet [id number] No Take 1 tablet (25 mg total) by mouth daily.  Patient not taking: Reported on 10/12/2022   Berniece Salines, DO Not Taking Active   valsartan (DIOVAN) 320 MG tablet [id number] Yes Take 1 tablet (320 mg total) by mouth daily. Tobb, Kardie, DO Taking Active               Assessment/Plan:   Diabetes: - Currently uncontrolled - Recommend to discontinue GLP1 due to pancreatitis s/p GLP1 initiation with no other documented cause - Recommend to continue basal and bolus insulin and metformin. Will discuss with PCP   Hyperlipidemia/ASCVD Risk Reduction: - Currently  uncontrolled. - Recommend to increase rosuvastatin to 20 mg daily for high intensity therapy. Follow lipids in 6-12 weeks, titrate to 40 mg daily if needed to target LDL <70  Heart Failure: - Currently opportunity for optimization - Recommend to continue valsartan, amlodipine, refill carvedilol and spironolactone. Follow up with cardiology. Will collaborate with PCP.    Follow Up Plan: follow up with PCP in 3 days. Phone call in ~ 2-3 weeks  Catie TJodi Mourning, PharmD, [place], [facility] [phone]

## 2022-10-19 ENCOUNTER — Ambulatory Visit: Payer: Self-pay | Admitting: Nurse Practitioner

## 2022-10-24 ENCOUNTER — Encounter: Payer: Self-pay | Admitting: Pharmacist

## 2022-10-26 ENCOUNTER — Other Ambulatory Visit: Payer: PRIVATE HEALTH INSURANCE | Admitting: Pharmacist

## 2022-10-26 ENCOUNTER — Other Ambulatory Visit: Payer: Self-pay

## 2022-10-26 MED ORDER — CARVEDILOL 25 MG PO TABS
25.0000 mg | ORAL_TABLET | Freq: Two times a day (BID) | ORAL | 1 refills | Status: DC
  Filled 2022-10-26: qty 60, 30d supply, fill #0
  Filled 2022-10-26: qty 180, 90d supply, fill #0
  Filled 2022-11-30: qty 60, 30d supply, fill #1
  Filled 2023-01-24: qty 60, 30d supply, fill #2
  Filled 2023-04-28: qty 60, 30d supply, fill #3

## 2022-10-26 MED ORDER — VALSARTAN 320 MG PO TABS
320.0000 mg | ORAL_TABLET | Freq: Every day | ORAL | 1 refills | Status: DC
  Filled 2022-10-26: qty 30, 30d supply, fill #0
  Filled 2022-10-26: qty 90, 90d supply, fill #0
  Filled 2022-11-30: qty 30, 30d supply, fill #1
  Filled 2023-01-24: qty 30, 30d supply, fill #2
  Filled 2023-04-28: qty 30, 30d supply, fill #3

## 2022-10-26 MED ORDER — ROSUVASTATIN CALCIUM 20 MG PO TABS
20.0000 mg | ORAL_TABLET | Freq: Every day | ORAL | 1 refills | Status: DC
  Filled 2022-10-26: qty 90, 90d supply, fill #0
  Filled 2022-10-26: qty 30, 30d supply, fill #0
  Filled 2022-11-30: qty 30, 30d supply, fill #1
  Filled 2023-01-24: qty 30, 30d supply, fill #2
  Filled 2023-04-28: qty 30, 30d supply, fill #3

## 2022-10-26 MED ORDER — SPIRONOLACTONE 25 MG PO TABS
25.0000 mg | ORAL_TABLET | Freq: Every day | ORAL | 3 refills | Status: DC
  Filled 2022-10-26: qty 30, 30d supply, fill #0

## 2022-10-26 MED ORDER — METFORMIN HCL ER 500 MG PO TB24
1000.0000 mg | ORAL_TABLET | Freq: Two times a day (BID) | ORAL | 5 refills | Status: DC
  Filled 2022-10-26 (×2): qty 120, 30d supply, fill #0
  Filled 2022-11-30: qty 120, 30d supply, fill #1
  Filled 2023-01-24: qty 120, 30d supply, fill #2
  Filled 2023-04-28: qty 120, 30d supply, fill #3

## 2022-10-26 MED ORDER — ASPIRIN 81 MG PO CHEW
81.0000 mg | CHEWABLE_TABLET | Freq: Every day | ORAL | 1 refills | Status: DC
  Filled 2022-10-26: qty 90, 90d supply, fill #0
  Filled 2023-01-24: qty 90, 90d supply, fill #1

## 2022-10-26 MED ORDER — AMLODIPINE BESYLATE 10 MG PO TABS
10.0000 mg | ORAL_TABLET | Freq: Every day | ORAL | 3 refills | Status: DC
  Filled 2022-10-26 (×2): qty 30, 30d supply, fill #0
  Filled 2022-11-30: qty 30, 30d supply, fill #1
  Filled 2023-01-24: qty 30, 30d supply, fill #2
  Filled 2023-04-28: qty 30, 30d supply, fill #3

## 2022-10-26 NOTE — Progress Notes (Signed)
Care Coordination Call  Collaborated with patient to review current medications.   Diabetes: - Metformin XR 1000 mg twice daily -$5 - Lantus 25 units daily - declines need for refill right now - Humalog 10 units three times daily - declines need for refill right now (GLP1 d/c due to recent pancreatitis)  HFrEF and HTN: - valsartan 320 mg daily - $12.50 - carvedilol 25 mg twice daily - $12.50 - spironolactone 25 mg daily - $12.50 - furosemide 20 mg daily PRN - declines need for refill right now - amlodipine 10 mg - $8.88  S/p CVA and hyperlipidemia; - rosuvastatin 20 mg - increased from 10 mg due to uncontrolled LDL - $12.50 - aspirin 81 mg daily - $0 (Completed DAPT with clopidogrel per recommendation from discharge summary)   Total for 90 day supplies is $63.88, total for 30 would be ~ $25. Patient elects to fill 30 day supply since she needs to get back in with cardiology and PCP, and doses/medications may be adjusted.   Confirmed patient has transportation to the Pharmacy at [facility] tomorrow after she gets paid. Confirmed patient has transportation to her PCP follow up next week.   Needs to schedule with neurology (previously referred by [facility]), follow up with cardiology (Dr. Harriet Masson).   Will follow up with patient next week after PCP visit.   Catie Hedwig Morton, PharmD, [place], [facility] [phone]

## 2022-10-26 NOTE — Addendum Note (Signed)
Addended by: Kaylyn Layer T on: 10/26/2022 09:19 AM   Modules accepted: Orders

## 2022-10-27 ENCOUNTER — Telehealth: Payer: Self-pay | Admitting: Cardiology

## 2022-10-27 NOTE — Telephone Encounter (Signed)
Left message to call back to schedule tele pre op appt.  

## 2022-10-27 NOTE — Telephone Encounter (Signed)
   Pre-operative Risk Assessment    Patient Name: Carolyn Zimmerman  DOB: [birth year] MRN: [id number]     Request for Surgical Clearance    Procedure:  Dental Extraction - Amount of Teeth to be Pulled:  scaling and root planing, and 3 extractions   Date of Surgery:  Clearance TBD                                 Surgeon:  Dr. Arizona Constable Group or Practice Name:  [facility]  Phone number:  [phone] Fax number:  [phone]   Type of Clearance Requested:   - Medical  - Pharmacy:  Hold Aspirin, need to know if it needs to be held   Type of Anesthesia:  Novocain    Additional requests/questions:    Signed, Selena Zobro   10/27/2022, 10:47 AM

## 2022-10-27 NOTE — Telephone Encounter (Signed)
   Name: Carolyn Zimmerman  DOB: [birth year]  MRN: [id number]  Primary Cardiologist: Berniece Salines, DO   Preoperative team, please contact this patient and set up a phone call appointment for further preoperative risk assessment. Please obtain consent and complete medication review. Thank you for your help.  I confirm that guidance regarding antiplatelet and oral anticoagulation therapy has been completed and, if necessary, noted below.  Regarding ASA therapy, it may be stopped 5-7 days prior to surgery with a plan to resume it as soon as felt to be feasible from a surgical standpoint in the post-operative period.    Mable Fill, Marissa Nestle, NP 10/27/2022, 11:13 AM [place]

## 2022-10-30 ENCOUNTER — Telehealth: Payer: Self-pay | Admitting: *Deleted

## 2022-10-30 NOTE — Telephone Encounter (Signed)
Pt is agreeable to plan of care for tele preop 11/03/22 @ 2 pm. Med rec  and consent are done.     Patient Consent for Virtual Visit        Carolyn Zimmerman has provided verbal consent on 10/30/2022 for a virtual visit (video or telephone).   CONSENT FOR VIRTUAL VISIT FOR:  Carolyn Zimmerman  By participating in this virtual visit I agree to the following:  I hereby voluntarily request, consent and authorize [facility] and its employed or contracted physicians, physician assistants, nurse practitioners or other licensed health care professionals (the Practitioner), to provide me with telemedicine health care services (the "Services") as deemed necessary by the treating Practitioner. I acknowledge and consent to receive the Services by the Practitioner via telemedicine. I understand that the telemedicine visit will involve communicating with the Practitioner through live audiovisual communication technology and the disclosure of certain medical information by electronic transmission. I acknowledge that I have been given the opportunity to request an in-person assessment or other available alternative prior to the telemedicine visit and am voluntarily participating in the telemedicine visit.  I understand that I have the right to withhold or withdraw my consent to the use of telemedicine in the course of my care at any time, without affecting my right to future care or treatment, and that the Practitioner or I may terminate the telemedicine visit at any time. I understand that I have the right to inspect all information obtained and/or recorded in the course of the telemedicine visit and may receive copies of available information for a reasonable fee.  I understand that some of the potential risks of receiving the Services via telemedicine include:  Delay or interruption in medical evaluation due to technological equipment failure or disruption; Information transmitted may not be sufficient (e.g.  poor resolution of images) to allow for appropriate medical decision making by the Practitioner; and/or  In rare instances, security protocols could fail, causing a breach of personal health information.  Furthermore, I acknowledge that it is my responsibility to provide information about my medical history, conditions and care that is complete and accurate to the best of my ability. I acknowledge that Practitioner's advice, recommendations, and/or decision may be based on factors not within their control, such as incomplete or inaccurate data provided by me or distortions of diagnostic images or specimens that may result from electronic transmissions. I understand that the practice of medicine is not an exact science and that Practitioner makes no warranties or guarantees regarding treatment outcomes. I acknowledge that a copy of this consent can be made available to me via my patient portal ([facility]), or I can request a printed copy by calling the office of [facility].    I understand that my insurance will be billed for this visit.   I have read or had this consent read to me. I understand the contents of this consent, which adequately explains the benefits and risks of the Services being provided via telemedicine.  I have been provided ample opportunity to ask questions regarding this consent and the Services and have had my questions answered to my satisfaction. I give my informed consent for the services to be provided through the use of telemedicine in my medical care

## 2022-10-30 NOTE — Telephone Encounter (Signed)
Pt is agreeable to plan of care for tele preop 11/03/22 @ 2 pm. Med rec  and consent are done.

## 2022-10-31 ENCOUNTER — Other Ambulatory Visit: Payer: Self-pay

## 2022-11-02 ENCOUNTER — Ambulatory Visit (INDEPENDENT_AMBULATORY_CARE_PROVIDER_SITE_OTHER): Payer: PRIVATE HEALTH INSURANCE | Admitting: Nurse Practitioner

## 2022-11-02 ENCOUNTER — Encounter: Payer: Self-pay | Admitting: Nurse Practitioner

## 2022-11-02 VITALS — BP 161/95 | HR 78 | Temp 97.3°F | Wt 283.8 lb

## 2022-11-02 DIAGNOSIS — Z794 Long term (current) use of insulin: Secondary | ICD-10-CM | POA: Diagnosis not present

## 2022-11-02 DIAGNOSIS — I779 Disorder of arteries and arterioles, unspecified: Secondary | ICD-10-CM | POA: Diagnosis not present

## 2022-11-02 DIAGNOSIS — E1165 Type 2 diabetes mellitus with hyperglycemia: Secondary | ICD-10-CM | POA: Diagnosis not present

## 2022-11-02 DIAGNOSIS — I639 Cerebral infarction, unspecified: Secondary | ICD-10-CM | POA: Diagnosis not present

## 2022-11-02 LAB — POCT GLYCOSYLATED HEMOGLOBIN (HGB A1C): Hemoglobin A1C: 11.9 % — AB (ref 4.0–5.6)

## 2022-11-02 NOTE — Assessment & Plan Note (Signed)
-   Ambulatory referral to Neurology - Ambulatory referral to Cardiology  2. Carotid artery disease, unspecified laterality, unspecified type ([place])  - Ambulatory referral to Cardiology  3. Type 2 diabetes mellitus with hyperglycemia, with long-term current use of insulin (HCC)  - POCT glycosylated hemoglobin (Hb A1C) - Microalbumin/Creatinine Ratio, Urine - Ambulatory referral to Endocrinology - CBC - Comprehensive metabolic panel  Lab Results  Component Value Date   HGBA1C 11.9 (A) 11/02/2022   Follow up:

## 2022-11-02 NOTE — Progress Notes (Deleted)
[facility] [place] [address]                                   Transitional Care Clinic   Post Hospital Discharge Acute Issues Care Follow Up                                                                        Patient Demographics  Carolyn Zimmerman, is a 37 y.o. female  DOB 1986/06/28  MRN [id number].  Primary MD  Fenton Foy, NP   Reason for TCC follow Up - ***   Past Medical History:  Diagnosis Date   Abscess of chest wall 06/15/2017   Coronavirus infection 05/02/2020   Diabetes mellitus    Hypertension    Jaundice    Obesity    TOA (tubo-ovarian abscess) 09/2019    Past Surgical History:  Procedure Laterality Date   CHOLECYSTECTOMY     IRRIGATION AND DEBRIDEMENT ABSCESS N/A 05/02/2020   Procedure: IRRIGATION AND DEBRIDEMENT ABDOMINAL and CHEST WALL NECROTIZING FASCITITS;  Surgeon: Michael Boston, MD;  Location: [facility];  Service: General;  Laterality: N/A;   liver stent         Recent HPI and Hospital Course  ***  [place] Issue to be followed in the Clinic   ***   Subjective:   Sharlette Dossett today has, No headache, No chest pain, No abdominal pain - No Nausea, No new weakness tingling or numbness, No Cough - SOB. ********  Assessment & Plan    There are no diagnoses linked to this encounter.   Reason for frequent admissions/ER visits **      Objective:   There were no vitals filed for this visit.  Wt Readings from Last 3 Encounters:  07/11/22 274 lb (124.3 kg)  07/09/22 274 lb (124.3 kg)  05/10/22 258 lb 8 oz (117.3 kg)    Allergies as of 11/02/2022       Reactions   Contrast Media [iodinated Contrast Media] Hives, Swelling        Medication List        Accurate as of November 02, 2022  8:07 AM. If you have any questions, ask your nurse or doctor.          acetaminophen 500 MG tablet Commonly known as: TYLENOL Take 2 tablets (1,000 mg  total) by mouth every 8 (eight) hours as needed for mild pain.   albuterol 108 (90 Base) MCG/ACT inhaler Commonly known as: VENTOLIN HFA Inhale 2 puffs into the lungs every 4 (four) hours as needed for wheezing or shortness of breath.   amLODipine 10 MG tablet Commonly known as: NORVASC Take 1 tablet (10 mg total) by mouth daily.   aspirin 81 MG chewable tablet Chew 1 tablet (81 mg total) by mouth daily.   blood glucose meter kit and supplies Dispense based on patient and insurance preference. Use up to four times daily as directed. (FOR ICD-9 250.00, 250.01).   carvedilol 25 MG tablet Commonly known as: COREG Take 1 tablet (25 mg total) by mouth 2 (two) times daily.  furosemide 20 MG tablet Commonly known as: LASIX Take 20 mg by mouth daily.   Insulin Glargine Solostar 100 UNIT/ML Solostar Pen Commonly known as: LANTUS Inject 25 Units into the skin daily.   insulin lispro 100 UNIT/ML KwikPen Commonly known as: HUMALOG Inject 10 Units into the skin 3 (three) times daily.   metFORMIN 500 MG 24 hr tablet Commonly known as: GLUCOPHAGE-XR Take 2 tablets (1,000 mg total) by mouth in the morning and at bedtime.   nicotine 14 mg/24hr patch Commonly known as: NICODERM CQ - dosed in mg/24 hours APPLY 1 PATCH(14 MG) TOPICALLY TO THE SKIN DAILY   nicotine polacrilex 4 MG gum Commonly known as: NICORETTE Take 1 each (4 mg total) by mouth as needed for smoking cessation. Use park and chew method as instructed.   rosuvastatin 20 MG tablet Commonly known as: CRESTOR Take 1 tablet (20 mg total) by mouth daily.   spironolactone 25 MG tablet Commonly known as: ALDACTONE Take 1 tablet (25 mg total) by mouth daily.   valsartan 320 MG tablet Commonly known as: DIOVAN Take 1 tablet (320 mg total) by mouth daily.         Physical Exam: Constitutional: Patient appears well-developed and well-nourished. Not in obvious distress. HENT: Normocephalic, atraumatic, External right  and left ear normal. Oropharynx is clear and moist.  Eyes: Conjunctivae and EOM are normal. PERRLA, no scleral icterus. Neck: Normal ROM. Neck supple. No JVD. No tracheal deviation. No thyromegaly. CVS: RRR, S1/S2 +, no murmurs, no gallops, no carotid bruit.  Pulmonary: Effort and breath sounds normal, no stridor, rhonchi, wheezes, rales.  Abdominal: Soft. BS +, no distension, tenderness, rebound or guarding.  Musculoskeletal: Normal range of motion. No edema and no tenderness.  Lymphadenopathy: No lymphadenopathy noted, cervical, inguinal or axillary Neuro: Alert. Normal reflexes, muscle tone coordination. No cranial nerve deficit. Skin: Skin is warm and dry. No rash noted. Not diaphoretic. No erythema. No pallor. Psychiatric: Normal mood and affect. Behavior, judgment, thought content normal.   Data Review   Micro Results No results found for this or any previous visit (from the past 240 hour(s)).   CBC No results for input(s): "WBC", "HGB", "HCT", "PLT", "MCV", "MCH", "MCHC", "RDW", "LYMPHSABS", "MONOABS", "EOSABS", "BASOSABS", "BANDABS" in the last 168 hours.  Invalid input(s): "NEUTRABS", "BANDSABD"  Chemistries  No results for input(s): "NA", "K", "CL", "CO2", "GLUCOSE", "BUN", "CREATININE", "CALCIUM", "MG", "AST", "ALT", "ALKPHOS", "BILITOT" in the last 168 hours.  Invalid input(s): "GFRCGP" ------------------------------------------------------------------------------------------------------------------ CrCl cannot be calculated (Patient's most recent lab result is older than the maximum 21 days allowed.). ------------------------------------------------------------------------------------------------------------------ No results for input(s): "HGBA1C" in the last 72 hours. ------------------------------------------------------------------------------------------------------------------ No results for input(s): "CHOL", "HDL", "LDLCALC", "TRIG", "CHOLHDL", "LDLDIRECT" in the  last 72 hours. ------------------------------------------------------------------------------------------------------------------ No results for input(s): "TSH", "T4TOTAL", "T3FREE", "THYROIDAB" in the last 72 hours.  Invalid input(s): "FREET3" ------------------------------------------------------------------------------------------------------------------ No results for input(s): "VITAMINB12", "FOLATE", "FERRITIN", "TIBC", "IRON", "RETICCTPCT" in the last 72 hours.  Coagulation profile No results for input(s): "INR", "PROTIME" in the last 168 hours.  No results for input(s): "DDIMER" in the last 72 hours.  Cardiac Enzymes No results for input(s): "CKMB", "TROPONINI", "MYOGLOBIN" in the last 168 hours.  Invalid input(s): "CK" ------------------------------------------------------------------------------------------------------------------ Invalid input(s): "POCBNP"   Time Spent in minutes  45 ***   Elyse Jarvis M.D on 11/02/2022 at 8:07 AM   **Disclaimer: This note may have been dictated with voice recognition software. Similar sounding words can inadvertently be transcribed and this note may contain transcription errors which may not  have been corrected upon publication of note.**

## 2022-11-02 NOTE — Patient Instructions (Signed)
1. Cerebrovascular accident (CVA), unspecified mechanism ([place])  - Ambulatory referral to Neurology - Ambulatory referral to Cardiology  2. Carotid artery disease, unspecified laterality, unspecified type ([place])  - Ambulatory referral to Cardiology  3. Type 2 diabetes mellitus with hyperglycemia, with long-term current use of insulin (HCC)  - POCT glycosylated hemoglobin (Hb A1C) - Microalbumin/Creatinine Ratio, Urine - Ambulatory referral to Endocrinology - CBC - Comprehensive metabolic panel  Lab Results  Component Value Date   HGBA1C 11.9 (A) 11/02/2022   Follow up:  Follow up in 3 months or sooner if needed

## 2022-11-02 NOTE — Progress Notes (Signed)
$@Patientx$  ID: Carolyn Zimmerman, female    DOB: Jan 17, 1986, 37 y.o.   MRN: [id number]  Chief Complaint  Patient presents with   Hospitalization Follow-up    Referring provider: Fenton Foy, NP   HPI   Patient presents today for hospital follow-up.  Patient was seen in the hospital through [facility] in October.  She failed to follow-up as directed.  She was also supposed to follow-up with neurology cardiology and endocrinology and failed to do that as well.  Patient has been noncompliant with her medications.  She has been followed by pharmacist helping with medication management.  Patient was hospitalized for pancreatitis as well as acute ischemic stroke due to stenosis of the left ICA in the setting of CAD. Secondary Discharge Diagnosis/Complications/Co-Morbidities:  morbid obesity, type II DM on long-term insulin, primary HTN, and tobacco use, pancreatitis.  We will place referral today for patient for neurology cardiology and endocrinology.  Diabetes is uncontrolled.  Patient admits that she does not take medications as directed. Denies f/c/s, n/v/d, hemoptysis, PND, leg swelling Denies chest pain or edema       Allergies  Allergen Reactions   Contrast Media [Iodinated Contrast Media] Hives and Swelling    Immunization History  Administered Date(s) Administered   Influenza,inj,Quad PF,6+ Mos 06/16/2017   Pneumococcal Polysaccharide-23 06/17/2017    Past Medical History:  Diagnosis Date   Abscess of chest wall 06/15/2017   Coronavirus infection 05/02/2020   Diabetes mellitus    Hypertension    Jaundice    Obesity    TOA (tubo-ovarian abscess) 09/2019    Tobacco History: Social History   Tobacco Use  Smoking Status Former   Packs/day: 0.50   Types: Cigarettes  Smokeless Tobacco Never   Counseling given: Not Answered   Outpatient Encounter Medications as of 11/02/2022  Medication Sig   acetaminophen (TYLENOL) 500 MG tablet Take 2 tablets (1,000 mg  total) by mouth every 8 (eight) hours as needed for mild pain.   albuterol (VENTOLIN HFA) 108 (90 Base) MCG/ACT inhaler Inhale 2 puffs into the lungs every 4 (four) hours as needed for wheezing or shortness of breath.   amLODipine (NORVASC) 10 MG tablet Take 1 tablet (10 mg total) by mouth daily.   aspirin 81 MG chewable tablet Chew 1 tablet (81 mg total) by mouth daily.   blood glucose meter kit and supplies Dispense based on patient and insurance preference. Use up to four times daily as directed. (FOR ICD-9 250.00, 250.01).   carvedilol (COREG) 25 MG tablet Take 1 tablet (25 mg total) by mouth 2 (two) times daily.   Insulin Glargine Solostar (LANTUS) 100 UNIT/ML Solostar Pen Inject 25 Units into the skin daily.   metFORMIN (GLUCOPHAGE-XR) 500 MG 24 hr tablet Take 2 tablets (1,000 mg total) by mouth in the morning and at bedtime.   rosuvastatin (CRESTOR) 20 MG tablet Take 1 tablet (20 mg total) by mouth daily.   spironolactone (ALDACTONE) 25 MG tablet Take 1 tablet (25 mg total) by mouth daily.   valsartan (DIOVAN) 320 MG tablet Take 1 tablet (320 mg total) by mouth daily.   furosemide (LASIX) 20 MG tablet Take 20 mg by mouth daily. (Patient not taking: Reported on 11/02/2022)   insulin lispro (HUMALOG) 100 UNIT/ML KwikPen Inject 10 Units into the skin 3 (three) times daily.   nicotine (NICODERM CQ - DOSED IN MG/24 HOURS) 14 mg/24hr patch APPLY 1 PATCH(14 MG) TOPICALLY TO THE SKIN DAILY (Patient not taking: Reported on 11/02/2022)  nicotine polacrilex (NICORETTE) 4 MG gum Take 1 each (4 mg total) by mouth as needed for smoking cessation. Use park and chew method as instructed. (Patient not taking: Reported on 10/12/2022)   Facility-Administered Encounter Medications as of 11/02/2022  Medication   cloNIDine (CATAPRES) tablet 0.2 mg     Review of Systems  Review of Systems  Constitutional: Negative.   HENT: Negative.    Cardiovascular: Negative.   Gastrointestinal: Negative.    Allergic/Immunologic: Negative.   Neurological: Negative.   Psychiatric/Behavioral: Negative.         Physical Exam  BP (!) 161/95   Pulse 78   Temp (!) 97.3 F (36.3 C)   Wt 283 lb 12.8 oz (128.7 kg)   SpO2 100%   BMI 43.79 kg/m   Wt Readings from Last 5 Encounters:  11/02/22 283 lb 12.8 oz (128.7 kg)  07/11/22 274 lb (124.3 kg)  07/09/22 274 lb (124.3 kg)  05/10/22 258 lb 8 oz (117.3 kg)  04/20/22 289 lb (131.1 kg)     Physical Exam Vitals and nursing note reviewed.  Constitutional:      General: She is not in acute distress.    Appearance: She is well-developed.  Cardiovascular:     Rate and Rhythm: Normal rate and regular rhythm.  Pulmonary:     Effort: Pulmonary effort is normal.     Breath sounds: Normal breath sounds.  Neurological:     Mental Status: She is alert and oriented to person, place, and time.      Lab Results:  CBC    Component Value Date/Time   WBC 13.1 (H) 07/10/2022 0135   RBC 4.60 07/10/2022 0135   HGB 12.4 07/10/2022 0135   HCT 38.2 07/10/2022 0135   PLT 277 07/10/2022 0135   MCV 83.0 07/10/2022 0135   MCH 27.0 07/10/2022 0135   MCHC 32.5 07/10/2022 0135   RDW 13.6 07/10/2022 0135   LYMPHSABS 3.4 07/10/2022 0135   MONOABS 1.0 07/10/2022 0135   EOSABS 0.1 07/10/2022 0135   BASOSABS 0.1 07/10/2022 0135    BMET    Component Value Date/Time   NA 131 (L) 07/10/2022 0135   NA 133 (L) 12/29/2021 0919   K 3.7 07/10/2022 0135   CL 102 07/10/2022 0135   CO2 23 07/10/2022 0135   GLUCOSE 373 (H) 07/10/2022 0135   BUN 17 07/10/2022 0135   BUN 17 12/29/2021 0919   CREATININE 0.79 07/10/2022 0135   CALCIUM 8.8 (L) 07/10/2022 0135   GFRNONAA >60 07/10/2022 0135   GFRAA >60 05/12/2020 0337    BNP    Component Value Date/Time   BNP 340.7 (H) 08/13/2021 0236    ProBNP No results found for: "PROBNP"  Imaging: No results found.   Assessment & Plan:   Cerebrovascular accident (CVA) [facility]) - Ambulatory referral to  Neurology - Ambulatory referral to Cardiology  2. Carotid artery disease, unspecified laterality, unspecified type ([place])  - Ambulatory referral to Cardiology  3. Type 2 diabetes mellitus with hyperglycemia, with long-term current use of insulin (HCC)  - POCT glycosylated hemoglobin (Hb A1C) - Microalbumin/Creatinine Ratio, Urine - Ambulatory referral to Endocrinology - CBC - Comprehensive metabolic panel  Lab Results  Component Value Date   HGBA1C 11.9 (A) 11/02/2022   Follow up:     Fenton Foy, NP 11/02/2022

## 2022-11-03 ENCOUNTER — Other Ambulatory Visit: Payer: Self-pay | Admitting: Nurse Practitioner

## 2022-11-03 ENCOUNTER — Ambulatory Visit: Payer: PRIVATE HEALTH INSURANCE | Attending: Internal Medicine | Admitting: Student

## 2022-11-03 DIAGNOSIS — Z01810 Encounter for preprocedural cardiovascular examination: Secondary | ICD-10-CM

## 2022-11-03 DIAGNOSIS — N289 Disorder of kidney and ureter, unspecified: Secondary | ICD-10-CM

## 2022-11-03 LAB — COMPREHENSIVE METABOLIC PANEL
ALT: 25 IU/L (ref 0–32)
AST: 29 IU/L (ref 0–40)
Albumin/Globulin Ratio: 1.1 — ABNORMAL LOW (ref 1.2–2.2)
Albumin: 3.3 g/dL — ABNORMAL LOW (ref 3.9–4.9)
Alkaline Phosphatase: 104 IU/L (ref 44–121)
BUN/Creatinine Ratio: 10 (ref 9–23)
BUN: 6 mg/dL (ref 6–20)
Bilirubin Total: <0.2 mg/dL (ref 0.0–1.2)
CO2: 19 mmol/L — ABNORMAL LOW (ref 20–29)
Calcium: 8.2 mg/dL — ABNORMAL LOW (ref 8.7–10.2)
Chloride: 102 mmol/L (ref 96–106)
Creatinine, Ser: 0.58 mg/dL (ref 0.57–1.00)
Globulin, Total: 3 g/dL (ref 1.5–4.5)
Glucose: 260 mg/dL — ABNORMAL HIGH (ref 70–99)
Potassium: 3.7 mmol/L (ref 3.5–5.2)
Sodium: 136 mmol/L (ref 134–144)
Total Protein: 6.3 g/dL (ref 6.0–8.5)
eGFR: 122 mL/min/{1.73_m2} (ref >59)

## 2022-11-03 LAB — CBC
Hematocrit: 37.8 % (ref 34.0–46.6)
Hemoglobin: 11.9 g/dL (ref 11.1–15.9)
MCH: 26 pg — ABNORMAL LOW (ref 26.6–33.0)
MCHC: 31.5 g/dL (ref 31.5–35.7)
MCV: 83 fL (ref 79–97)
Platelets: 339 10*3/uL (ref 150–450)
RBC: 4.58 x10E6/uL (ref 3.77–5.28)
RDW: 13.7 % (ref 11.7–15.4)
WBC: 8.6 10*3/uL (ref 3.4–10.8)

## 2022-11-03 LAB — MICROALBUMIN / CREATININE URINE RATIO
Creatinine, Urine: 141.4 mg/dL
Microalb/Creat Ratio: 1723 mg/g creat — ABNORMAL HIGH (ref 0–29)
Microalbumin, Urine: 2436.8 ug/mL

## 2022-11-03 NOTE — Addendum Note (Signed)
Addended by: Mayra Reel on: 11/03/2022 02:36 PM   Modules accepted: Level of Service

## 2022-11-03 NOTE — Progress Notes (Signed)
Virtual Visit via Telephone Note   Because of Carolyn Zimmerman's co-morbid illnesses, she is at least at moderate risk for complications without adequate follow up.  This format is felt to be most appropriate for this patient at this time.  The patient did not have access to video technology/had technical difficulties with video requiring transitioning to audio format only (telephone).  All issues noted in this document were discussed and addressed.  No physical exam could be performed with this format.  Please refer to the patient's chart for her consent to telehealth for [facility].  Evaluation Performed:  Preoperative cardiovascular risk assessment _____________   Date:  11/03/2022   Patient ID:  Carolyn Zimmerman, DOB May 26, 1986, MRN [id number] Patient Location:  Home Provider location:   Office  Primary Care Provider:  Fenton Foy, NP Primary Cardiologist:  Berniece Salines, DO  Chief Complaint / Patient Profile   37 y.o. y/o female with a h/o HFrEF, hypertension, T2DM who is pending extraction of 3 teeth with scaling and root planing and presents today for telephonic preoperative cardiovascular risk assessment.  History of Present Illness    Carolyn Zimmerman is a 37 y.o. female who presents via audio/video conferencing for a telehealth visit today.  Pt was last seen in cardiology clinic on 04/20/2022 by Dr. Harriet Masson.  At that time Carolyn Zimmerman was hypertensive and having some personal issues causing anxiety and depression. She reported compliance with medications but had missed a couple of doses of aldactone. Patient was referred to advanced hypertension clinic but it does not appear she was seen.  The patient is now pending procedure as outlined above. Since her last visit, she has been dealing with more health issues. She states she had "7 mini strokes" in October for which she is followed by [facility] She has been doing okay since then with no deficits. She has been managing  her BP well. It was high at her PCP's office this week because she has been drinking a lot of pedilyte secondary to vomiting and diarrhea from antibiotic. She does not have a working BP cuff at home to check her pressure. Patient denies shortness of breath or dyspnea on exertion. No chest pain, pressure, or tightness. Denies lower extremity edema, orthopnea, or PND. No palpitations. She works five 12 hour shifts per week at [employer]. She is on her feet most of her shift performing her work tasks without any difficulty.   Past Medical History    Past Medical History:  Diagnosis Date   Abscess of chest wall 06/15/2017   Coronavirus infection 05/02/2020   Diabetes mellitus    Hypertension    Jaundice    Obesity    TOA (tubo-ovarian abscess) 09/2019   Past Surgical History:  Procedure Laterality Date   CHOLECYSTECTOMY     IRRIGATION AND DEBRIDEMENT ABSCESS N/A 05/02/2020   Procedure: IRRIGATION AND DEBRIDEMENT ABDOMINAL and CHEST WALL NECROTIZING FASCITITS;  Surgeon: Michael Boston, MD;  Location: [facility];  Service: General;  Laterality: N/A;   liver stent      Allergies  Allergies  Allergen Reactions   Contrast Media [Iodinated Contrast Media] Hives and Swelling    Home Medications    Prior to Admission medications   Medication Sig Start Date End Date Taking? Authorizing Provider  acetaminophen (TYLENOL) 500 MG tablet Take 2 tablets (1,000 mg total) by mouth every 8 (eight) hours as needed for mild pain. 06/02/20   Azzie Glatter, FNP  albuterol (VENTOLIN HFA) 108 (  90 Base) MCG/ACT inhaler Inhale 2 puffs into the lungs every 4 (four) hours as needed for wheezing or shortness of breath. 06/02/20   Azzie Glatter, FNP  amLODipine (NORVASC) 10 MG tablet Take 1 tablet (10 mg total) by mouth daily. 10/26/22   Fenton Foy, NP  aspirin 81 MG chewable tablet Chew 1 tablet (81 mg total) by mouth daily. 10/26/22   Fenton Foy, NP  blood glucose meter kit and supplies Dispense based on  patient and insurance preference. Use up to four times daily as directed. (FOR ICD-9 250.00, 250.01). 06/02/20   Azzie Glatter, FNP  carvedilol (COREG) 25 MG tablet Take 1 tablet (25 mg total) by mouth 2 (two) times daily. 10/26/22   Fenton Foy, NP  furosemide (LASIX) 20 MG tablet Take 20 mg by mouth daily. Patient not taking: Reported on 11/02/2022    [provider]  Insulin Glargine Solostar (LANTUS) 100 UNIT/ML Solostar Pen Inject 25 Units into the skin daily. 07/16/22   [provider]  insulin lispro (HUMALOG) 100 UNIT/ML KwikPen Inject 10 Units into the skin 3 (three) times daily. 07/16/22   [provider]  metFORMIN (GLUCOPHAGE-XR) 500 MG 24 hr tablet Take 2 tablets (1,000 mg total) by mouth in the morning and at bedtime. 10/26/22   Fenton Foy, NP  nicotine (NICODERM CQ - DOSED IN MG/24 HOURS) 14 mg/24hr patch APPLY 1 PATCH(14 MG) TOPICALLY TO THE SKIN DAILY Patient not taking: Reported on 11/02/2022 08/25/22   Tobb, Godfrey Pick, DO  nicotine polacrilex (NICORETTE) 4 MG gum Take 1 each (4 mg total) by mouth as needed for smoking cessation. Use park and chew method as instructed. Patient not taking: Reported on 10/12/2022 05/26/22   Tobb, Kardie, DO  rosuvastatin (CRESTOR) 20 MG tablet Take 1 tablet (20 mg total) by mouth daily. 10/26/22   Fenton Foy, NP  spironolactone (ALDACTONE) 25 MG tablet Take 1 tablet (25 mg total) by mouth daily. 10/26/22   Fenton Foy, NP  valsartan (DIOVAN) 320 MG tablet Take 1 tablet (320 mg total) by mouth daily. 10/26/22   Fenton Foy, NP    Physical Exam    Vital Signs:  Carolyn Zimmerman does not have vital signs available for review today.  Given telephonic nature of communication, physical exam is limited. AAOx3. NAD. Normal affect.  Speech and respirations are unlabored.  Accessory Clinical Findings    None  Assessment & Plan    Primary Cardiologist: Berniece Salines, DO  Preoperative cardiovascular risk assessment.  Dental Extraction - Amount of Teeth to be Pulled:  scaling and root planing, and 3 extractions    Chart reviewed as part of pre-operative protocol coverage. According to the RCRI, patient has a 6.6% risk of MACE. Patient reports activity equivalent to 4.0 METS (doing vigorous work tasks five 12 hour shifts a week at [employer]).   Given past medical history and time since last visit, based on ACC/AHA guidelines, Carolyn Zimmerman would be at acceptable risk for the planned procedure without further cardiovascular testing.   Patient was advised that if she develops new symptoms prior to surgery to contact our office to arrange a follow-up appointment.  she verbalized understanding.  Given the patient's comorbid conditions, we prefer to continue ASA throughout the perioperative period. Given that this procedure is considered moderate bleeding risk, surgeon may hold 5-7 days if deemed necessary for increased bleeding risk.   I will route this recommendation to the requesting party via Epic  fax function.  Please call with questions.  Time:   Today, I have spent 9 minutes with the patient with telehealth technology discussing medical history, symptoms, and management plan.     Mayra Reel, NP  11/03/2022, 1:18 PM

## 2022-11-06 ENCOUNTER — Encounter: Payer: Self-pay | Admitting: Neurology

## 2022-11-13 ENCOUNTER — Encounter (HOSPITAL_BASED_OUTPATIENT_CLINIC_OR_DEPARTMENT_OTHER): Payer: Self-pay | Admitting: Family

## 2022-11-13 NOTE — Progress Notes (Signed)
Cardiology Office Note:    Date:  11/14/2022   ID:  Carolyn Zimmerman, DOB 10/01/1985, MRN [id number]  PCP:  Fenton Foy, NP   [place] [facility] Cardiologist:  Berniece Salines, DO     Referring MD: Fenton Foy, NP   No chief complaint on file.   History of Present Illness:    Carolyn Zimmerman is a 37 y.o. female with a hx of uncontrolled hypertension, CVA (06/2022 acute left cerebral hemisphere compatible with watershed infarct), DM 2, HFmrEF.  She initially presented to [facility] in January 2022 due to shortness of breath.  She was noted to be significantly hypertensive at this visit, her lisinopril was stopped and she was started on valsartan, she was continued on HCTZ, amlodipine, Lasix.   She had an echo on 09/29/2021 which revealed an EF of 45%, global hypokinesis, moderate LVH, LA was severely dilated, RA was mildly dilated, mild MR.  Delene Loll was recommended however her insurance company appealed this.  She was transition back to valsartan and carvedilol was added.  She returned for a follow-up visit in March 2023, her blood pressure was very elevated, she was urged to go to the ED however she declined.  Her valsartan was increased to 320 mg daily, carvedilol increased to 25 mg twice a day, she was started on Aldactone 25 mg daily.  She return the following week for a follow-up visit, she remained hypertensive and she was sent to the emergency department given clonidine and was discharged home.  She was then referred to the advanced hypertension clinic, secondary causes for hypertension were unremarkable (renal ultrasound was normal, sleep study was normal).  Most recently evaluated by Dr. Harriet Masson on 04/20/2022, at that time she was dealing with a lot of personal stressors.  Her blood pressure remained elevated.  Ambulatory referrals were made to behavioral health, endocrinology, family practice, and care navigation.  She presents today for surgical clearance to have three  teeth extracted, she is accompanied by a friend. She endorses a high level of stress r/t upcoming move as well as the pain r/t her teeth. She is taking 1,000 mg of ibuprofen every four hours. She endorses an episode of palpitations this am on her way here that she attributes to anxiety surrounding today's appointment. She also has had pedal edema x 4 days that she typically gets when her BP is elevated -- she has not taken her PRN lasix as she thought she was still on HCTZ and that it did t. She has not taken any of her antihypertensives this am as she typically takes them around 0930 (appointment time today 0800). BP is 196/120, advised her she needs to be evaluated in the ED, however she is not inclined to go as she has to move out of her apartment today and has to pick her kids up. She denies chest pain, dyspnea, pnd, orthopnea, n, v, dizziness, syncope, weight gain, or early satiety.   Past Medical History:  Diagnosis Date   Abscess of chest wall 06/15/2017   Coronavirus infection 05/02/2020   CVA (cerebral vascular accident) ([place]) 07/14/2022   L MCA CVA, watershed infarct   Diabetes mellitus    Heart failure with mildly reduced ejection fraction (HFmrEF) ([place]) 09/29/2021   EF 45%, moderate LVH   Hyperlipidemia associated with type 2 diabetes mellitus ([place])    Hypertension    Jaundice    Obesity    TOA (tubo-ovarian abscess) 09/2019    Past Surgical History:  Procedure Laterality  Date   CHOLECYSTECTOMY     IRRIGATION AND DEBRIDEMENT ABSCESS N/A 05/02/2020   Procedure: IRRIGATION AND DEBRIDEMENT ABDOMINAL and CHEST WALL NECROTIZING FASCITITS;  Surgeon: Michael Boston, MD;  Location: [facility];  Service: General;  Laterality: N/A;   liver stent      Current Medications: Current Meds  Medication Sig   acetaminophen (TYLENOL) 500 MG tablet Take 2 tablets (1,000 mg total) by mouth every 8 (eight) hours as needed for mild pain.   albuterol (VENTOLIN HFA) 108 (90 Base) MCG/ACT inhaler Inhale 2  puffs into the lungs every 4 (four) hours as needed for wheezing or shortness of breath.   amLODipine (NORVASC) 10 MG tablet Take 1 tablet (10 mg total) by mouth daily.   aspirin 81 MG chewable tablet Chew 1 tablet (81 mg total) by mouth daily.   blood glucose meter kit and supplies Dispense based on patient and insurance preference. Use up to four times daily as directed. (FOR ICD-9 250.00, 250.01).   carvedilol (COREG) 25 MG tablet Take 1 tablet (25 mg total) by mouth 2 (two) times daily.   Insulin Glargine Solostar (LANTUS) 100 UNIT/ML Solostar Pen Inject 25 Units into the skin daily.   insulin lispro (HUMALOG) 100 UNIT/ML KwikPen Inject 10 Units into the skin 3 (three) times daily.   metFORMIN (GLUCOPHAGE-XR) 500 MG 24 hr tablet Take 2 tablets (1,000 mg total) by mouth in the morning and at bedtime.   nicotine (NICODERM CQ - DOSED IN MG/24 HOURS) 14 mg/24hr patch APPLY 1 PATCH(14 MG) TOPICALLY TO THE SKIN DAILY   nicotine polacrilex (NICORETTE) 4 MG gum Take 1 each (4 mg total) by mouth as needed for smoking cessation. Use park and chew method as instructed.   rosuvastatin (CRESTOR) 20 MG tablet Take 1 tablet (20 mg total) by mouth daily.   valsartan (DIOVAN) 320 MG tablet Take 1 tablet (320 mg total) by mouth daily.   [DISCONTINUED] furosemide (LASIX) 20 MG tablet Take 20 mg by mouth daily.   [DISCONTINUED] spironolactone (ALDACTONE) 25 MG tablet Take 1 tablet (25 mg total) by mouth daily.   Current Facility-Administered Medications for the 11/14/22 encounter (Office Visit) with Trudi Ida, NP  Medication   cloNIDine (CATAPRES) tablet 0.2 mg     Allergies:   Contrast media [iodinated contrast media]   Social History   Socioeconomic History   Marital status: Single    Spouse name: Not on file   Number of children: Not on file   Years of education: Not on file   Highest education level: Not on file  Occupational History   Not on file  Tobacco Use   Smoking status: Former     Packs/day: 0.50    Types: Cigarettes   Smokeless tobacco: Never  Vaping Use   Vaping Use: Former  Substance and Sexual Activity   Alcohol use: Not Currently    Comment: occ   Drug use: No   Sexual activity: Not on file  Other Topics Concern   Not on file  Social History Narrative   Not on file   Social Determinants of Health   Financial Resource Strain: Medium Risk (04/28/2022)   Overall Financial Resource Strain (CARDIA)    Difficulty of Paying Living Expenses: Somewhat hard  Food Insecurity: No Food Insecurity (04/28/2022)   Hunger Vital Sign    Worried About Running Out of Food in the Last Year: Never true    Ran Out of Food in the Last Year: Never true  Transportation  Needs: No Transportation Needs (04/28/2022)   PRAPARE - Hydrologist (Medical): No    Lack of Transportation (Non-Medical): No  Physical Activity: Not on file  Stress: Stress Concern Present (04/28/2022)   [place]    Feeling of Stress : Rather much  Social Connections: Not on file     Family History: The patient's family history includes Diabetes in her father; Hypertension in her father and mother.  ROS:   Please see the history of present illness.    All other systems reviewed and are negative.  EKGs/Labs/Other Studies Reviewed:    The following studies were reviewed today:  01/19/2022 renal artery ultrasound -no evidence of renal artery stenosis.  09/29/2021 echo complete-EF 45%, global hypokinesis, moderate LVH.  LA severely dilated.  RA mildly dilated.  Mild MR.    EKG:  EKG is ordered today.  The ekg ordered today demonstrates SR, HR 74 bpm, stable TWI in V6, corrected Qtc is 444 per [place].   Recent Labs: 12/08/2021: TSH 0.786 12/29/2021: Magnesium 1.7 11/02/2022: ALT 25; BUN 6; Creatinine, Ser 0.58; Hemoglobin 11.9; Platelets 339; Potassium 3.7; Sodium 136  Recent Lipid Panel    Component Value  Date/Time   CHOL 172 12/29/2021 0919   TRIG 219 (H) 12/29/2021 0919   HDL 34 (L) 12/29/2021 0919   CHOLHDL 5.1 (H) 12/29/2021 0919   LDLCALC 100 (H) 12/29/2021 0919     Risk Assessment/Calculations:      HYPERTENSION CONTROL Vitals:   11/14/22 0825 11/14/22 0905 11/14/22 0923 11/14/22 0924  BP: (!) 196/120 (!) 188/100 (!) 193/114 (!) 175/115    The patient's blood pressure is elevated above target today.  In order to address the patient's elevated BP: Blood pressure will be monitored at home to determine if medication changes need to be made.; A current anti-hypertensive medication was adjusted today.; A referral to the Advanced Hypertension Clinic will be placed.            Physical Exam:    VS:  BP (!) 175/115 Comment: right arm  Pulse 75   Ht 5' 7.5" (1.715 m)   Wt 289 lb (131.1 kg)   BMI 44.60 kg/m     Wt Readings from Last 3 Encounters:  11/14/22 289 lb (131.1 kg)  11/02/22 283 lb 12.8 oz (128.7 kg)  07/11/22 274 lb (124.3 kg)     GEN:  Well nourished, well developed in no acute distress HEENT: Normal NECK: No JVD; No carotid bruits LYMPHATICS: No lymphadenopathy CARDIAC: RRR, no murmurs, rubs, gallops RESPIRATORY:  Clear to auscultation without rales, wheezing or rhonchi  ABDOMEN: Soft, non-tender, non-distended MUSCULOSKELETAL:  +2 pitting edema mid-tibia; No deformity  SKIN: Warm and dry NEUROLOGIC:  Alert and oriented x 3 PSYCHIATRIC:  Normal affect   ASSESSMENT:    1. Accelerated hypertension   2. Heart failure with mildly reduced ejection fraction ([place])   3. Type 2 diabetes mellitus with hyperglycemia, with long-term current use of insulin ([place])   4. Mixed hyperlipidemia   5. Morbid obesity ([place])   6. Cerebrovascular accident (CVA), unspecified mechanism ([place])   7. Dental abscess    PLAN:    In order of problems listed above:  Hypertension - BP today 196/120 > 193/114 > 175/115, she has been compliant with her medications however had not  taken any of her a.m. medications today as she typically takes them later in the morning.  She did bring her  medications with her and took them while in our office.  We rechecked her blood pressure with marginal changes.  Advised her she needed to be evaluated in the emergency department, however she declined to go as she needed to pick up her children and was in the process of moving out of her home.  We increased her spironolactone to 50 mg daily.  She will take Lasix 20 mg daily x 3 days, then as needed for pedal edema.  She will continue to check her blood pressure daily, report continued elevation to our office.  She will return in 1 week to the Advanced Hypertension Clinic. Will repeat BMET at that visit. Continue amlodipine 10 mg daily, carvedilol 25 mg twice a day, valsartan 320 mg daily, she will increase her spironolactone to 50 mg daily.  Heart failure with mildly reduced ejection fraction -echo on 09/29/2021 with EF of 45%, moderate LVH, mild MR. NYHA class I today.  She has had pedal edema for 4 days, she will take Lasix 20 mg daily for the next 3 days then as needed for pedal edema.  Will repeat her BMET in 1 week.  Continue carvedilol, spironolactone, valsartan, Lasix per above.  DM 2 -A1c 11.9 on 11/02/2022, she is not monitoring her blood sugar at home although she does have a glucose monitor.  Managed by her PCP.  Referred to endocrinology at her previous visit.  Hyperlipidemia -LDL on 12/29/2021 was 100, triglycerides were 219, Crestor was increased to 20 mg daily on 10/12/22.  Managed by her PCP.  Obesity -BMI 44, she is not able to focus right now on diet and exercise as she is in a significant amount of pain recently related to several teeth that need to be extracted.  CVA - admitted 07/14/22 for three day weakness of her upper extremities, symptoms were felt to be d/t L MCA CVA d/t carotid disease. She was started on ASA and Plavix for 90 days, with plans to continue ASA indefinitely.    Dental abscess -she is requiring cardiac clearance for the extraction of 3 teeth, her blood pressure is uncontrolled and we are not able to clear her today.  She will return in 1 week for a follow-up to our advanced hypertension clinic.  I have spent 50 minutes in direct patient care today with adjusting her medications and attempting to get her BP under better control as she denied to go to the ED for further evaluation.   Disposition -increase spironolactone to 50 mg daily, take lasix 20 mg PO x 3 days for pedal edema, then as needed for pedal edema.  Referral to advanced hypertension clinic in 1 week.  Repeat BMET in 1 week.      [id number]    Medication Adjustments/Labs and Tests Ordered: Current medicines are reviewed at length with the patient today.  Concerns regarding medicines are outlined above.  Orders Placed This Encounter  Procedures   Basic metabolic panel   AMB REFERRAL TO ADVANCED HTN CLINIC   EKG 12-Lead   Meds ordered this encounter  Medications   spironolactone (ALDACTONE) 50 MG tablet    Sig: Take 1 tablet (50 mg total) by mouth daily.    Dispense:  90 tablet    Refill:  1    Order Specific Question:   Supervising Provider    Answer:   Buford Dresser [id number]   furosemide (LASIX) 20 MG tablet    Sig: Take 1 tablet (20 mg total) by mouth daily.  Dispense:  90 tablet    Refill:  3    Patient Instructions  Medication Instructions:  Your physician has recommended you make the following change in your medication:   INCREASE Spironolactone to '50mg'$  daily You may use up your '25mg'$  tablets by taking two together.  Take Lasix daily x 3 days then as needed for swelling.   *If you need a refill on your cardiac medications before your next appointment, please call your pharmacy*   Lab Work: Your physician recommends that you return for lab work in 1 week for [facility]  Please return for Lab work. You may come to the...   [facility] (3rd  floor) [address] [place], [place] [address]  Open: 8am-Noon and 1pm-4:30pm  Please ring the doorbell on the small table when you exit the elevator and the Lab Tech will come get you  [place] at [facility] [address] [place], [place], [place] [address] Open: 8am-1pm, then 2pm-4:30pm   [place]- Please see attached locations sheet stapled to your lab work with address and hours.    If you have labs (blood work) drawn today and your tests are completely normal, you will receive your results only by: [place] (if you have MyChart) OR A paper copy in the mail If you have any lab test that is abnormal or we need to change your treatment, we will call you to review the results.   Testing/Procedures: None ordered today.   Follow-Up: At [facility], you and your health needs are our priority.  As part of our continuing mission to provide you with exceptional heart care, we have created designated Provider Care Teams.  These Care Teams include your primary Cardiologist (physician) and Advanced Practice Providers (APPs -  Physician Assistants and Nurse Practitioners) who all work together to provide you with the care you need, when you need it.  We recommend signing up for the patient portal called "MyChart".  Sign up information is provided on this After Visit Summary.  MyChart is used to connect with patients for Virtual Visits (Telemedicine).  Patients are able to view lab/test results, encounter notes, upcoming appointments, etc.  Non-urgent messages can be sent to your provider as well.   To learn more about what you can do with MyChart, go to [email or web address].    Your next appointment:   1 week(s)  Provider:   Berniece Salines, DO  or Advanced Practice Provider (Or Advanced Hypertension Clinic)  Appointment may be virtual so long as you are checking blood pressure at home.   Other Instructions  Tips to Measure your  Blood Pressure Correctly  Check blood pressure at least one time per day at least TWO hours after your medications.  Here's what you can do to ensure a correct reading:  Don't drink a caffeinated beverage or smoke during the 30 minutes before the test.  Sit quietly for five minutes before the test begins.  During the measurement, sit in a chair with your feet on the floor and your arm supported so your elbow is at about heart level.  The inflatable part of the cuff should completely cover at least 80% of your upper arm, and the cuff should be placed on bare skin, not over a shirt.  Don't talk during the measurement.   Blood pressure categories  Blood pressure category SYSTOLIC (upper number)  DIASTOLIC (lower number)  Normal Less than 120 mm Hg and Less than 80 mm Hg  Elevated 120-129 mm Hg and Less than 80 mm Hg  High blood pressure: Stage 1 hypertension 130-139 mm Hg or 80-89 mm Hg  High blood pressure: Stage 2 hypertension 140 mm Hg or higher or 90 mm Hg or higher  Hypertensive crisis (consult your doctor immediately) Higher than 180 mm Hg and/or Higher than 120 mm Hg  Source: American Heart Association and American Stroke Association. For more on getting your blood pressure under control, buy Controlling Your Blood Pressure, a Special Health Report from [facility].   Blood Pressure Log   Date   Time  Blood Pressure  Example: Nov 1 9 AM 124/78                                                 Signed, Trudi Ida, NP  11/14/2022 12:54 PM    [place]

## 2022-11-14 ENCOUNTER — Other Ambulatory Visit: Payer: Self-pay

## 2022-11-14 ENCOUNTER — Encounter (HOSPITAL_BASED_OUTPATIENT_CLINIC_OR_DEPARTMENT_OTHER): Payer: Self-pay | Admitting: Cardiology

## 2022-11-14 ENCOUNTER — Ambulatory Visit (INDEPENDENT_AMBULATORY_CARE_PROVIDER_SITE_OTHER): Payer: No Typology Code available for payment source | Admitting: Cardiology

## 2022-11-14 VITALS — BP 175/115 | HR 75 | Ht 67.5 in | Wt 289.0 lb

## 2022-11-14 DIAGNOSIS — K047 Periapical abscess without sinus: Secondary | ICD-10-CM

## 2022-11-14 DIAGNOSIS — I5020 Unspecified systolic (congestive) heart failure: Secondary | ICD-10-CM

## 2022-11-14 DIAGNOSIS — I10 Essential (primary) hypertension: Secondary | ICD-10-CM

## 2022-11-14 DIAGNOSIS — I5022 Chronic systolic (congestive) heart failure: Secondary | ICD-10-CM

## 2022-11-14 DIAGNOSIS — E1165 Type 2 diabetes mellitus with hyperglycemia: Secondary | ICD-10-CM

## 2022-11-14 DIAGNOSIS — I639 Cerebral infarction, unspecified: Secondary | ICD-10-CM

## 2022-11-14 DIAGNOSIS — Z794 Long term (current) use of insulin: Secondary | ICD-10-CM

## 2022-11-14 DIAGNOSIS — E782 Mixed hyperlipidemia: Secondary | ICD-10-CM

## 2022-11-14 MED ORDER — FUROSEMIDE 20 MG PO TABS
20.0000 mg | ORAL_TABLET | Freq: Every day | ORAL | 3 refills | Status: DC
  Filled 2022-11-14 – 2022-12-06 (×2): qty 90, 90d supply, fill #0

## 2022-11-14 MED ORDER — SPIRONOLACTONE 50 MG PO TABS
50.0000 mg | ORAL_TABLET | Freq: Every day | ORAL | 1 refills | Status: DC
  Filled 2022-11-14 – 2023-04-28 (×3): qty 90, 90d supply, fill #0

## 2022-11-14 NOTE — Patient Instructions (Addendum)
Medication Instructions:  Your physician has recommended you make the following change in your medication:   INCREASE Spironolactone to '50mg'$  daily You may use up your '25mg'$  tablets by taking two together.  Take Lasix daily x 3 days then as needed for swelling.   *If you need a refill on your cardiac medications before your next appointment, please call your pharmacy*   Lab Work: Your physician recommends that you return for lab work in 1 week for [facility]  Please return for Lab work. You may come to the...   [facility] (3rd floor) [address], [place], [place] [address]  Open: 8am-Noon and 1pm-4:30pm  Please ring the doorbell on the small table when you exit the elevator and the Lab Tech will come get you  [place] at [facility] [address] [place], [place], [place] [address] Open: 8am-1pm, then 2pm-4:30pm   [place]- Please see attached locations sheet stapled to your lab work with address and hours.    If you have labs (blood work) drawn today and your tests are completely normal, you will receive your results only by: [place] (if you have MyChart) OR A paper copy in the mail If you have any lab test that is abnormal or we need to change your treatment, we will call you to review the results.   Testing/Procedures: None ordered today.   Follow-Up: At [facility], you and your health needs are our priority.  As part of our continuing mission to provide you with exceptional heart care, we have created designated Provider Care Teams.  These Care Teams include your primary Cardiologist (physician) and Advanced Practice Providers (APPs -  Physician Assistants and Nurse Practitioners) who all work together to provide you with the care you need, when you need it.  We recommend signing up for the patient portal called "MyChart".  Sign up information is provided on this After Visit Summary.  MyChart is used to connect with  patients for Virtual Visits (Telemedicine).  Patients are able to view lab/test results, encounter notes, upcoming appointments, etc.  Non-urgent messages can be sent to your provider as well.   To learn more about what you can do with MyChart, go to [email or web address].    Your next appointment:   1 week(s)  Provider:   Berniece Salines, DO  or Advanced Practice Provider (Or Advanced Hypertension Clinic)  Appointment may be virtual so long as you are checking blood pressure at home.   Other Instructions  Tips to Measure your Blood Pressure Correctly  Check blood pressure at least one time per day at least TWO hours after your medications.  Here's what you can do to ensure a correct reading:  Don't drink a caffeinated beverage or smoke during the 30 minutes before the test.  Sit quietly for five minutes before the test begins.  During the measurement, sit in a chair with your feet on the floor and your arm supported so your elbow is at about heart level.  The inflatable part of the cuff should completely cover at least 80% of your upper arm, and the cuff should be placed on bare skin, not over a shirt.  Don't talk during the measurement.   Blood pressure categories  Blood pressure category SYSTOLIC (upper number)  DIASTOLIC (lower number)  Normal Less than 120 mm Hg and Less than 80 mm Hg  Elevated 120-129 mm Hg and Less than 80 mm Hg  High blood pressure: Stage 1 hypertension  130-139 mm Hg or 80-89 mm Hg  High blood pressure: Stage 2 hypertension 140 mm Hg or higher or 90 mm Hg or higher  Hypertensive crisis (consult your doctor immediately) Higher than 180 mm Hg and/or Higher than 120 mm Hg  Source: American Heart Association and American Stroke Association. For more on getting your blood pressure under control, buy Controlling Your Blood Pressure, a Special Health Report from [facility].   Blood Pressure Log   Date   Time  Blood Pressure  Example: Nov 1 9  AM 124/78

## 2022-11-20 NOTE — Progress Notes (Signed)
Initial neurology clinic note  Carolyn Zimmerman MRN: [id number] DOB: 24-Jan-1986  Referring provider: Fenton Foy, NP  Primary care provider: Fenton Foy, NP  Reason for consult:  stroke  Subjective:  This is Ms. Carolyn Zimmerman, a 37 y.o. right-handed female with a medical history of HTN, HLD, CAD, HFrEF, DM (on insulin), obesity, pancreatitis, and tobacco use who presents to neurology clinic due to stroke. The patient is accompanied by good friend and son.  Patient was admitted on 07/14/22 for intermittent right arm numbness and weakness for 3 weeks. Her exam worsened after initial presentation. CTA showed focal moderate to severe narrowing of the left cavernous ICA. MRI brain showed a left anterior and posterior circulation watershed infarcts. She was not a candidate for acute intervention. Per notes: STROKE WORK-UP Brain Imaging CT head: No evidence of acute intracranial abnormality.Calcified atherosclerotic plaque within the intracranial internal carotid arteries. CT Abdomen: No evidence of nephrolithiasis or hydronephrosis. Mild nonspecific left perinephric fat stranding. Left adnexal cyst measuring 3.6 x 3.6 cm. Trace amount of fluid in the cul-de-sac MR Brain wwo: Acute infarct left cerebral hemisphere compatible with watershed infarct. No intracranial hemorrhage MRA wwo: Severe stenosis in the left cavernous ICA, with a thread-like area of flow signal. No other hemodynamically significant stenosis or occlusion in the major intracranial arteries.Apparent stenosis in the proximal left ICA on time-of-flight imaging is felt to be artifactual given the absence of stenosis on postcontrast imaging. No hemodynamically significant stenosis in the neck. CT Head: Evolving infarcts in the anterior and posterior watershed distribution of the left cerebrum, as well as in the left frontal corona radiata. No overt hemorrhagic transformation although there may be minimal petechial blood products  in the left frontal lobe. CTA: Evolving infarcts in the left MCA watershed distribution without substantial mass effect or hemorrhagic transformation. Age-advanced partially calcified atherosclerosis along the carotid siphons resulting in focal moderate-to-severe narrowing of the left cavernous ICA and mild-to-moderate narrowing of the right paraclinoid ICA. Moderate narrowing of the right P2 segment, likely related to intracranial atherosclerosis given the presence of partially calcified atherosclerotic plaque in the anterior circulation. No significant arterial stenosis or vessel occlusion is identified in the neck. TTE: EF 45-50%, Severely reduced LV Global L Strain =-11%, Strain pattern is consistent with amyloid, would pursue outpt PYP scan.  LDL: 53  Hba1c: 10.6   Given aforementioned workup the patient's presenting symptoms were felt to be most likely secondary to L MCA CVA which was likely due to ICAD (cartoid disease) . The patient was started on Aspirin 81 mg and Plavix 75 mg for 90 days per [facility] for future stroke prevention which should be continued upon discharge. While in house the patient worked with PT/OT who recommended OP PT. On 10/29, the patient was deemed medically stable for discharge from the hospital to home with close PCP, Neurology-Stroke, Cardiology, Endocrinology follow up.   Patient took DAPT for 90 days. She stopped plavix after 90 days and states she takes her asa 81 mg daily. She is also taking Crestor 20 mg daily. Patient states she takes her medication daily. She does admit she missed some in the past, but has been better recently. She has not taken her medications today (BP elevated at office today).  Patient still has right arm and leg numbness. They are sometimes weak as well. She feels off when walking, feeling like she could fall. She denies vision changes. She has a little difficulty with memory as well compared to prior. She  denies clear aphasia. She denies  dysphagia. She denies shortness of breath.  Patient continues to smoke. She is trying but has not been able to stop. She has patches, which help some, but not smoking makes her agitated. Patient denies EtOH or any drugs.  Patient is following with cardiology here Venia Carbon, NP). Patient has not been able to establish with endocrinology.  MEDICATIONS:  Outpatient Encounter Medications as of 11/30/2022  Medication Sig Note   acetaminophen (TYLENOL) 500 MG tablet Take 2 tablets (1,000 mg total) by mouth every 8 (eight) hours as needed for mild pain. 11/02/2022: Prn    albuterol (VENTOLIN HFA) 108 (90 Base) MCG/ACT inhaler Inhale 2 puffs into the lungs every 4 (four) hours as needed for wheezing or shortness of breath.    amLODipine (NORVASC) 10 MG tablet Take 1 tablet (10 mg total) by mouth daily.    aspirin 81 MG chewable tablet Chew 1 tablet (81 mg total) by mouth daily.    blood glucose meter kit and supplies Dispense based on patient and insurance preference. Use up to four times daily as directed. (FOR ICD-9 250.00, 250.01).    carvedilol (COREG) 25 MG tablet Take 1 tablet (25 mg total) by mouth 2 (two) times daily.    furosemide (LASIX) 20 MG tablet Take 1 tablet (20 mg total) by mouth daily.    Insulin Glargine Solostar (LANTUS) 100 UNIT/ML Solostar Pen Inject 25 Units into the skin daily.    insulin lispro (HUMALOG) 100 UNIT/ML KwikPen Inject 10 Units into the skin 3 (three) times daily. 10/12/2022: Sliding Scale: Administer an extra 2 units for every reading 50> than 150. 200: 2 units 250: 4 units 300: 6 units 350: 8 units 400: Seek medical care   metFORMIN (GLUCOPHAGE-XR) 500 MG 24 hr tablet Take 2 tablets (1,000 mg total) by mouth in the morning and at bedtime.    rosuvastatin (CRESTOR) 20 MG tablet Take 1 tablet (20 mg total) by mouth daily.    spironolactone (ALDACTONE) 50 MG tablet Take 1 tablet (50 mg total) by mouth daily.    valsartan (DIOVAN) 320 MG tablet Take 1 tablet (320  mg total) by mouth daily.    nicotine (NICODERM CQ - DOSED IN MG/24 HOURS) 14 mg/24hr patch APPLY 1 PATCH(14 MG) TOPICALLY TO THE SKIN DAILY (Patient not taking: Reported on 11/30/2022)    nicotine polacrilex (NICORETTE) 4 MG gum Take 1 each (4 mg total) by mouth as needed for smoking cessation. Use park and chew method as instructed. (Patient not taking: Reported on 11/30/2022)    Facility-Administered Encounter Medications as of 11/30/2022  Medication   cloNIDine (CATAPRES) tablet 0.2 mg    PAST MEDICAL HISTORY: Past Medical History:  Diagnosis Date   Abscess of chest wall 06/15/2017   Coronavirus infection 05/02/2020   CVA (cerebral vascular accident) ([place]) 07/14/2022   L MCA CVA, watershed infarct   Diabetes mellitus    Heart failure with mildly reduced ejection fraction (HFmrEF) ([place]) 09/29/2021   EF 45%, moderate LVH   Hyperlipidemia associated with type 2 diabetes mellitus ([place])    Hypertension    Jaundice    Obesity    TOA (tubo-ovarian abscess) 09/2019    PAST SURGICAL HISTORY: Past Surgical History:  Procedure Laterality Date   CHOLECYSTECTOMY     IRRIGATION AND DEBRIDEMENT ABSCESS N/A 05/02/2020   Procedure: IRRIGATION AND DEBRIDEMENT ABDOMINAL and CHEST WALL NECROTIZING FASCITITS;  Surgeon: Michael Boston, MD;  Location: [facility];  Service: General;  Laterality: N/A;  liver stent      ALLERGIES: Allergies  Allergen Reactions   Contrast Media [Iodinated Contrast Media] Hives and Swelling    FAMILY HISTORY: Family History  Problem Relation Age of Onset   Hypertension Mother    Diabetes Father    Hypertension Father     SOCIAL HISTORY: Social History   Tobacco Use   Smoking status: Former    Packs/day: .5    Types: Cigarettes   Smokeless tobacco: Never  Vaping Use   Vaping Use: Former  Substance Use Topics   Alcohol use: Not Currently    Comment: occ   Drug use: No   Social History   Social History Narrative   Are you right handed or left handed?  right   Are you currently employed ? yes   What is your current occupation?[employer]   Do you live at home alone?with kids   Who lives with you?    What type of home do you live in: 1 story or 2 story? two    Caffeine 1-3 a day    Objective:  Vital Signs:  BP (!) 183/102   Wt 282 lb (127.9 kg)   BMI 43.52 kg/m    General: No acute distress.  Patient appears well-groomed.   Head:  Normocephalic/atraumatic Neck: supple, no paraspinal tenderness, full range of motion Back: No paraspinal tenderness Heart: regular rate and rhythm Lungs: Clear to auscultation bilaterally. Vascular: No carotid bruits.  Neurological Exam: Mental status: alert and oriented, speech fluent and not dysarthric, language intact.  Cranial nerves: CN I: not tested CN II: pupils equal, round and reactive to light, visual fields intact CN III, IV, VI:  full range of motion, no nystagmus, no ptosis CN V: facial sensation intact. CN VII: upper and lower face symmetric CN VIII: hearing intact CN IX, X: gag intact, uvula midline CN XI: sternocleidomastoid and trapezius muscles intact CN XII: tongue midline  Bulk & Tone: normal, no fasciculations. Motor:  muscle strength 5/5 throughout Deep Tendon Reflexes:  2+ throughout  Sensation:  Pinprick sensation intact. Finger to nose testing:  Without dysmetria.   Heel to shin:  Without dysmetria.   Gait:  Normal station and stride.  Romberg negative.   Labs and Imaging review: Internal labs: Lab Results  Component Value Date   HGBA1C 11.9 (A) 11/02/2022   No results found for: "VITAMINB12" Lab Results  Component Value Date   TSH 0.786 12/08/2021   Lab Results  Component Value Date   ESRSEDRATE 84 (H) 05/02/2020   External: Total Cholesterol 25 - 199 MG/DL 128   Triglycerides 10 - 150 MG/DL 199 High    HDL Cholesterol 35 - 135 MG/DL 31 Low    LDL Cholesterol Calculated 0 - 99 MG/DL 57   Total Chol / HDL Cholesterol <4.5 4.1   Non-HDL  Cholesterol MG/DL 97     Imaging: External imaging: MRI brain w/wo contrast (07/12/22): FINDINGS:  Brain: Acute infarcts in the right frontal parietal lobe. These  extend into the deep white matter and also involve the left frontal  cortex and left parietal cortex. Watershed territory distribution.  Mild enhancement in the left frontal white matter most compatible  with acute/subacute infarct.   Ventricle size normal. Negative for chronic ischemia. No mass or  hemorrhage identified.   Vascular: Normal arterial flow voids.   Skull and upper cervical spine: No focal skeletal lesion   Sinuses/Orbits: Negative   Other: None   IMPRESSION:  Acute infarct left cerebral  hemisphere compatible with watershed  infarct. No intracranial hemorrhage.   MRA brain and neck (07/13/22): FINDINGS:  MRA NECK FINDINGS   Standard aortic branching. No evidence of aortic dissection or  aneurysm. The origins of the branch vessels are patent.   Apparent stenosis in the proximal left ICA on time-of-flight imaging  (series 7, image 64) is felt to be artifactual given the absence of  stenosis on postcontrast imaging. No other hemodynamically  significant stenosis or occlusion in the left carotid system.   No hemodynamically significant stenosis or occlusion in the right  carotid system or bilateral vertebral arteries.   MRA HEAD FINDINGS   Anterior circulation: Severe stenosis in the left cavernous ICA,  with a thread-like area of flow signal (series 5, image 117 and  series 11 of the MRA neck, image 9). Both internal carotid arteries  are otherwise patent to the termini.   A1 segments patent. Normal anterior communicating artery. Anterior  cerebral arteries are patent to their distal aspects.   No M1 stenosis or occlusion. Normal MCA bifurcations. Distal MCA  branches perfused and symmetric.   Posterior circulation: Vertebral arteries patent to the  vertebrobasilar junction without  stenosis. Posterior inferior  cerebral arteries patent bilaterally.   Basilar patent to its distal aspect. Superior cerebellar arteries  patent bilaterally.   Patent P1 segments. PCAs perfused to their distal aspects without  stenosis. The bilateral posterior communicating arteries are not  visualized.   Anatomic variants: None significant   IMPRESSION:  1. Severe stenosis in the left cavernous ICA, with a thread-like  area of flow signal.  2. No other hemodynamically significant stenosis or occlusion in the  major intracranial arteries.  3. Apparent stenosis in the proximal left ICA on time-of-flight  imaging is felt to be artifactual given the absence of stenosis on  postcontrast imaging. No hemodynamically significant stenosis in the  neck.   CT head wo contrast (07/14/22): FINDINGS:  Brain: Abnormal hypodensities in the anterior and posterior  watershed distribution of the left cerebrum with loss of gray-white  junction (image 21, series 5 in the parietal lobe and the same image  in the frontal lobe) are noted, representing early findings related  to the known infarcts. There is also a new hypodensity in the left  frontal corona radiata on image 20 of series 5 corresponding to  known infarct. No overt hemorrhagic transformation although minimal  petechial blood products could be present on image 22 series 5. No  obvious new area of involvement on CT.   Otherwise, the brainstem, cerebellum, cerebral peduncles, thalamus,  basal ganglia, basilar cisterns, and ventricular system appear  within normal limits. No mass lesion observed.   Vascular: Unremarkable. Please note however the patient had a  substantial abnormality of the left cavernous ICA on prior MR  angiography of the brain.   Skull: Unremarkable   Sinuses/Orbits: Unremarkable   Other: No supplemental non-categorized findings.   IMPRESSION:  1. Evolving infarcts in the anterior and posterior watershed   distribution of the left cerebrum, as well as in the left frontal  corona radiata. No overt hemorrhagic transformation although there  may be minimal petechial blood products in the left frontal lobe.   CTA head and neck (07/14/22): Anterior circulation: Narrowing of the left cavernous ICA in a region of partially calcified atherosclerosis resulting in a focal site of moderate to severe narrowing (series 6, image 388) with additional moderate narrowing elsewhere in the cavernous ICA. Mild to moderate narrowing of the  right paraclinoid ICA at the site of partially calcified atherosclerosis. No aneurysm.   Posterior circulation: Moderate narrowing of the right P2 segment, presumably related to intracranial atherosclerosis given the presence of partially calcified atherosclerotic plaque in the anterior circulation. No large vessel occlusion. No aneurysm.   Venous structures: Opacified intracranial venous structures appear patent.   Impression: 1.  Evolving infarcts in the left MCA watershed distribution without substantial mass effect or hemorrhagic transformation.  2.  Age-advanced partially calcified atherosclerosis along the carotid siphons resulting in focal moderate-to-severe narrowing of the left cavernous ICA and mild-to-moderate narrowing of the right paraclinoid ICA.  3.  Moderate narrowing of the right P2 segment, likely related to intracranial atherosclerosis given the presence of partially calcified atherosclerotic plaque in the anterior circulation.  4.  No significant arterial stenosis or vessel occlusion is identified in the neck.   Echo (09/29/21): 1. Left ventricular ejection fraction by 3D volume is 45 %. The left  ventricle has moderately decreased function. The left ventricle  demonstrates global hypokinesis. There is moderate left ventricular  hypertrophy. Left ventricular diastolic parameters  are indeterminate. The average left ventricular global longitudinal strain  is  -7.7 %. The global longitudinal strain is abnormal.   2. Right ventricular systolic function is normal. The right ventricular  size is normal.   3. Left atrial size was severely dilated.   4. Right atrial size was mildly dilated.   5. A small pericardial effusion is present. The pericardial effusion is  circumferential. There is no evidence of cardiac tamponade.   6. The mitral valve is normal in structure. Mild mitral valve  regurgitation. No evidence of mitral stenosis.   7. The aortic valve is normal in structure. Aortic valve regurgitation is  not visualized. No aortic stenosis is present.   8. The inferior vena cava is normal in size with greater than 50%  respiratory variability, suggesting right atrial pressure of 3 mmHg.   Assessment/Plan:  Loistine Eberlin is a 37 y.o. female who presents for stroke follow up. She has a relevant medical history of HTN, HLD, CAD, HFrEF, DM (on insulin), obesity, pancreatitis, and tobacco use. Her neurological examination is essentially normal today. Available diagnostic data is significant for MRI brain with left hemisphere watershed infarcts. MRA and CTA showed focal moderate-to-severe narrowing of the left cavernous ICA and moderate narrowing of the right P2 segment. Echo showed reduced EF and severely dilated left atria. HbA1c was 11.9, LDL 57. BP today was 183/102 and 160/100 at recheck. Patient completed 90 days of DAPT and is currently on asa 81 mg monotherapy.  Patient's stroke was likely secondary to multiple uncontrolled risk factors causing vasculopathy and ICA stenosis, thus lack of blood flow causing watershed infarcts. Her many uncontrolled risk factors for vasculopathy, which I discussed with the patient today, includes HTN, HLD, DM, smoking, and obesity. I am not sure if there are options for intervention on patient's left ICA, but given that it was symptomatic, I will send to Dr. Kathyrn Sheriff for endovascular opinion.  PLAN: -Blood work:  TSH -Continue asa 81 mg daily -Continue Crestor 20 mg daily -Discussed importance of smoking cessation,  BP control, and DM control -Referral to interventional radiology for ICA stenosis - Consuella Lose, MD - endovascular [facility]) -Follow up with cardiology and endocrinology as planned -May consider a sleep study to screen for OSA  -Return to clinic 6 months or sooner if needed  The impression above as well as the plan as  outlined below were extensively discussed with the patient (in the company of friend and son) who voiced understanding. All questions were answered to their satisfaction.  When available, results of the above investigations and possible further recommendations will be communicated to the patient via telephone/MyChart. Patient to call office if not contacted after expected testing turnaround time.   Total time spent reviewing records, interview, history/exam, documentation, and coordination of care on day of encounter:  85 min   Thank you for allowing me to participate in patient's care.  If I can answer any additional questions, I would be pleased to do so.  Kai Levins, MD   CC: Fenton Foy, NP [place] [id number]  CC: Referring provider: Fenton Foy, NP [address] [place],  [place] [address]

## 2022-11-21 ENCOUNTER — Other Ambulatory Visit: Payer: Self-pay

## 2022-11-22 ENCOUNTER — Encounter (HOSPITAL_BASED_OUTPATIENT_CLINIC_OR_DEPARTMENT_OTHER): Payer: Self-pay | Admitting: *Deleted

## 2022-11-23 ENCOUNTER — Ambulatory Visit (HOSPITAL_BASED_OUTPATIENT_CLINIC_OR_DEPARTMENT_OTHER): Payer: No Typology Code available for payment source | Admitting: Family

## 2022-11-23 ENCOUNTER — Ambulatory Visit (HOSPITAL_BASED_OUTPATIENT_CLINIC_OR_DEPARTMENT_OTHER): Payer: No Typology Code available for payment source | Admitting: Cardiovascular Disease

## 2022-11-30 ENCOUNTER — Encounter: Payer: Self-pay | Admitting: Neurology

## 2022-11-30 ENCOUNTER — Other Ambulatory Visit: Payer: Self-pay

## 2022-11-30 ENCOUNTER — Ambulatory Visit: Payer: No Typology Code available for payment source | Admitting: Neurology

## 2022-11-30 ENCOUNTER — Other Ambulatory Visit (INDEPENDENT_AMBULATORY_CARE_PROVIDER_SITE_OTHER): Payer: No Typology Code available for payment source

## 2022-11-30 VITALS — BP 160/100 | Wt 282.0 lb

## 2022-11-30 DIAGNOSIS — I6522 Occlusion and stenosis of left carotid artery: Secondary | ICD-10-CM

## 2022-11-30 DIAGNOSIS — R531 Weakness: Secondary | ICD-10-CM

## 2022-11-30 DIAGNOSIS — F17200 Nicotine dependence, unspecified, uncomplicated: Secondary | ICD-10-CM

## 2022-11-30 DIAGNOSIS — E1165 Type 2 diabetes mellitus with hyperglycemia: Secondary | ICD-10-CM

## 2022-11-30 DIAGNOSIS — I6350 Cerebral infarction due to unspecified occlusion or stenosis of unspecified cerebral artery: Secondary | ICD-10-CM

## 2022-11-30 DIAGNOSIS — I10 Essential (primary) hypertension: Secondary | ICD-10-CM

## 2022-11-30 DIAGNOSIS — E782 Mixed hyperlipidemia: Secondary | ICD-10-CM

## 2022-11-30 DIAGNOSIS — R200 Anesthesia of skin: Secondary | ICD-10-CM | POA: Diagnosis not present

## 2022-11-30 LAB — TSH: TSH: 0.46 mIU/L

## 2022-11-30 NOTE — Patient Instructions (Signed)
I saw you today for follow up for your stroke on the left side of your brain (this controls your right side, explaining the numbness and weakness on the right that you felt). The stroke was likely the result of a tight blood vessel in your brain. This has been caused by smoking, uncontrolled blood pressure, and uncontrolled diabetes.  Working on these things will help reduced your risk of another stroke.  I would like to get blood work today.  I would like to send you to Dr. Kathyrn Sheriff at [facility] to see if there is any options for the tight blood vessel.  Please continue to see cardiology and establish care with endocrinology as you are trying to do.  Cut back and quitting smoking would be the best change you can make for your health.  Continue aspirin 81 mg daily and Crestor 20 mg daily.  I would like to see you back in 6 months or sooner if needed.  Please let me know if you have any questions or concerns in the meantime.  The physicians and staff at [facility] are committed to providing excellent care. You may receive a survey requesting feedback about your experience at our office. We strive to receive "very good" responses to the survey questions. If you feel that your experience would prevent you from giving the office a "very good " response, please contact our office to try to remedy the situation. We may be reached at [phone]. Thank you for taking the time out of your busy day to complete the survey.  Kai Levins, MD [facility]

## 2022-12-04 ENCOUNTER — Ambulatory Visit: Payer: No Typology Code available for payment source | Admitting: Cardiology

## 2022-12-06 ENCOUNTER — Other Ambulatory Visit: Payer: Self-pay

## 2022-12-18 ENCOUNTER — Encounter: Payer: Self-pay | Admitting: Cardiology

## 2022-12-18 ENCOUNTER — Other Ambulatory Visit: Payer: Self-pay

## 2022-12-18 ENCOUNTER — Ambulatory Visit: Payer: No Typology Code available for payment source | Attending: Cardiology | Admitting: Cardiology

## 2022-12-18 VITALS — BP 160/98 | HR 84 | Ht 67.5 in | Wt 274.0 lb

## 2022-12-18 DIAGNOSIS — I119 Hypertensive heart disease without heart failure: Secondary | ICD-10-CM | POA: Diagnosis not present

## 2022-12-18 DIAGNOSIS — E1165 Type 2 diabetes mellitus with hyperglycemia: Secondary | ICD-10-CM

## 2022-12-18 DIAGNOSIS — Z79899 Other long term (current) drug therapy: Secondary | ICD-10-CM | POA: Diagnosis not present

## 2022-12-18 DIAGNOSIS — Z8673 Personal history of transient ischemic attack (TIA), and cerebral infarction without residual deficits: Secondary | ICD-10-CM

## 2022-12-18 DIAGNOSIS — F17200 Nicotine dependence, unspecified, uncomplicated: Secondary | ICD-10-CM

## 2022-12-18 DIAGNOSIS — Z794 Long term (current) use of insulin: Secondary | ICD-10-CM

## 2022-12-18 DIAGNOSIS — Z01818 Encounter for other preprocedural examination: Secondary | ICD-10-CM

## 2022-12-18 MED ORDER — HYDROCHLOROTHIAZIDE 12.5 MG PO CAPS
12.5000 mg | ORAL_CAPSULE | Freq: Every day | ORAL | 3 refills | Status: DC
  Filled 2022-12-18 – 2023-01-24 (×2): qty 90, 90d supply, fill #0
  Filled 2023-04-28: qty 90, 90d supply, fill #1

## 2022-12-18 NOTE — Patient Instructions (Signed)
Medication Instructions:  Your physician has recommended you make the following change in your medication:  START: Hydrochlorothiazide 12.5 mg once daily *If you need a refill on your cardiac medications before your next appointment, please call your pharmacy*   Lab Work: Your physician recommends that you have labs drawn today.: CMET, Mag If you have labs (blood work) drawn today and your tests are completely normal, you will receive your results only by: [place] (if you have MyChart) OR A paper copy in the mail If you have any lab test that is abnormal or we need to change your treatment, we will call you to review the results.   Testing/Procedures: None   Follow-Up: At [facility], you and your health needs are our priority.  As part of our continuing mission to provide you with exceptional heart care, we have created designated Provider Care Teams.  These Care Teams include your primary Cardiologist (physician) and Advanced Practice Providers (APPs -  Physician Assistants and Nurse Practitioners) who all work together to provide you with the care you need, when you need it.   Your next appointment:   4 week(s) double book okay.   Provider:   Berniece Salines, DO

## 2022-12-19 ENCOUNTER — Telehealth: Payer: Self-pay | Admitting: *Deleted

## 2022-12-19 DIAGNOSIS — Z8673 Personal history of transient ischemic attack (TIA), and cerebral infarction without residual deficits: Secondary | ICD-10-CM | POA: Insufficient documentation

## 2022-12-19 LAB — COMPREHENSIVE METABOLIC PANEL
ALT: 16 IU/L (ref 0–32)
AST: 15 IU/L (ref 0–40)
Albumin/Globulin Ratio: 1.1 — ABNORMAL LOW (ref 1.2–2.2)
Albumin: 3.7 g/dL — ABNORMAL LOW (ref 3.9–4.9)
Alkaline Phosphatase: 132 IU/L — ABNORMAL HIGH (ref 44–121)
BUN/Creatinine Ratio: 14 (ref 9–23)
BUN: 12 mg/dL (ref 6–20)
Bilirubin Total: 0.2 mg/dL (ref 0.0–1.2)
CO2: 20 mmol/L (ref 20–29)
Calcium: 9.3 mg/dL (ref 8.7–10.2)
Chloride: 98 mmol/L (ref 96–106)
Creatinine, Ser: 0.83 mg/dL (ref 0.57–1.00)
Globulin, Total: 3.5 g/dL (ref 1.5–4.5)
Glucose: 450 mg/dL — ABNORMAL HIGH (ref 70–99)
Potassium: 4.7 mmol/L (ref 3.5–5.2)
Sodium: 134 mmol/L (ref 134–144)
Total Protein: 7.2 g/dL (ref 6.0–8.5)
eGFR: 95 mL/min/{1.73_m2} (ref >59)

## 2022-12-19 LAB — MAGNESIUM: Magnesium: 1.9 mg/dL (ref 1.6–2.3)

## 2022-12-19 NOTE — Telephone Encounter (Signed)
   Patient Name: Carolyn Zimmerman  DOB: 01/17/1986 MRN: [id number]  Primary Cardiologist: Berniece Salines, DO  Chart reviewed as part of pre-operative protocol coverage.   Simple dental extractions (i.e. 1-2 teeth) are considered low risk procedures per guidelines and generally do not require any specific cardiac clearance. It is also generally accepted that for simple extractions and dental cleanings, there is no need to interrupt blood thinner therapy.  understanding.  SBE prophylaxis is not required for the patient from a cardiac standpoint.  I will route this recommendation to the requesting party via Epic fax function and remove from pre-op pool.  Please call with questions.  Mable Fill, Marissa Nestle, NP 12/19/2022, 9:24 AM

## 2022-12-19 NOTE — Progress Notes (Signed)
Cardiology Office Note:    Date:  12/19/2022   ID:  Carolyn Zimmerman, DOB 1986/01/22, MRN [id number]  PCP:  Fenton Foy, NP  Cardiologist:  Berniece Salines, DO  Electrophysiologist:  None   Referring MD: Fenton Foy, NP   Chief Complaint  Patient presents with   Follow-up    6 months.    History of Present Illness:    Carolyn Zimmerman is a 37 y.o. female with a hx of uncontrolled hypertension, CVA in October 2023 acute left cerebral hemisphere compatible with watershed infarct, diabetes mellitus, heart failure with midrange ejection fraction, smoker, obesity, hyperlipidemia.  I last saw the patient August 2023 at that time she was dealing with multiple stressors.  I referred her to our hypertensive clinic as well, behavioral health, endocrinology, family practice as well as care navigation.  Also refer the patient for renal denervation patient was noted to not be a candidate.  Since her visit with me she has not followed up with heart care in our other clinics including the pharmacy hypertension clinic.  Today she tells me that she had a really hard time after the stroke and had not taken some of the medications Brion Hedges significantly depressed but thinks to her mom and her best friend has been there for her head encouraged her that she now can be able to see her doctors.  She is grateful. She becomes teary during the visit.  She does not offer any specific clinical complaints at this time.  No chest pain or shortness of breath.  Past Medical History:  Diagnosis Date   Abscess of chest wall 06/15/2017   Coronavirus infection 05/02/2020   CVA (cerebral vascular accident) 07/14/2022   L MCA CVA, watershed infarct   Diabetes mellitus    Heart failure with mildly reduced ejection fraction (HFmrEF) 09/29/2021   EF 45%, moderate LVH   Hyperlipidemia associated with type 2 diabetes mellitus    Hypertension    Jaundice    Obesity    TOA (tubo-ovarian abscess) 09/2019    Past  Surgical History:  Procedure Laterality Date   CHOLECYSTECTOMY     IRRIGATION AND DEBRIDEMENT ABSCESS N/A 05/02/2020   Procedure: IRRIGATION AND DEBRIDEMENT ABDOMINAL and CHEST WALL NECROTIZING FASCITITS;  Surgeon: Michael Boston, MD;  Location: [facility];  Service: General;  Laterality: N/A;   liver stent      Current Medications: Current Meds  Medication Sig   acetaminophen (TYLENOL) 500 MG tablet Take 2 tablets (1,000 mg total) by mouth every 8 (eight) hours as needed for mild pain.   albuterol (VENTOLIN HFA) 108 (90 Base) MCG/ACT inhaler Inhale 2 puffs into the lungs every 4 (four) hours as needed for wheezing or shortness of breath.   amLODipine (NORVASC) 10 MG tablet Take 1 tablet (10 mg total) by mouth daily.   aspirin 81 MG chewable tablet Chew 1 tablet (81 mg total) by mouth daily.   blood glucose meter kit and supplies Dispense based on patient and insurance preference. Use up to four times daily as directed. (FOR ICD-9 250.00, 250.01).   carvedilol (COREG) 25 MG tablet Take 1 tablet (25 mg total) by mouth 2 (two) times daily.   furosemide (LASIX) 20 MG tablet Take 1 tablet (20 mg total) by mouth daily.   hydrochlorothiazide (MICROZIDE) 12.5 MG capsule Take 1 capsule (12.5 mg total) by mouth daily.   Insulin Glargine Solostar (LANTUS) 100 UNIT/ML Solostar Pen Inject 25 Units into the skin daily.   insulin lispro (HUMALOG)  100 UNIT/ML KwikPen Inject 10 Units into the skin 3 (three) times daily.   metFORMIN (GLUCOPHAGE-XR) 500 MG 24 hr tablet Take 2 tablets (1,000 mg total) by mouth in the morning and at bedtime.   nicotine (NICODERM CQ - DOSED IN MG/24 HOURS) 14 mg/24hr patch APPLY 1 PATCH(14 MG) TOPICALLY TO THE SKIN DAILY   nicotine polacrilex (NICORETTE) 4 MG gum Take 1 each (4 mg total) by mouth as needed for smoking cessation. Use park and chew method as instructed.   rosuvastatin (CRESTOR) 20 MG tablet Take 1 tablet (20 mg total) by mouth daily.   spironolactone (ALDACTONE) 50 MG  tablet Take 1 tablet (50 mg total) by mouth daily.   valsartan (DIOVAN) 320 MG tablet Take 1 tablet (320 mg total) by mouth daily.   Current Facility-Administered Medications for the 12/18/22 encounter (Office Visit) with Berniece Salines, DO  Medication   cloNIDine (CATAPRES) tablet 0.2 mg     Allergies:   Contrast media [iodinated contrast media]   Social History   Socioeconomic History   Marital status: Single    Spouse name: Not on file   Number of children: Not on file   Years of education: Not on file   Highest education level: Not on file  Occupational History   Not on file  Tobacco Use   Smoking status: Former    Packs/day: .5    Types: Cigarettes   Smokeless tobacco: Never  Vaping Use   Vaping Use: Former  Substance and Sexual Activity   Alcohol use: Not Currently    Comment: occ   Drug use: No   Sexual activity: Not on file  Other Topics Concern   Not on file  Social History Narrative   Are you right handed or left handed? right   Are you currently employed ? yes   What is your current occupation?[employer]   Do you live at home alone?with kids   Who lives with you?    What type of home do you live in: 1 story or 2 story? two    Caffeine 1-3 a day   Social Determinants of Health   Financial Resource Strain: Medium Risk (04/28/2022)   Overall Financial Resource Strain (CARDIA)    Difficulty of Paying Living Expenses: Somewhat hard  Food Insecurity: No Food Insecurity (04/28/2022)   Hunger Vital Sign    Worried About Running Out of Food in the Last Year: Never true    Ran Out of Food in the Last Year: Never true  Transportation Needs: No Transportation Needs (04/28/2022)   PRAPARE - Hydrologist (Medical): No    Lack of Transportation (Non-Medical): No  Physical Activity: Not on file  Stress: Stress Concern Present (04/28/2022)   [place]    Feeling of Stress : Rather much   Social Connections: Not on file     Family History: The patient's family history includes Diabetes in her father; Hypertension in her father and mother.  ROS:   Review of Systems  Constitution: Negative for decreased appetite, fever and weight gain.  HENT: Negative for congestion, ear discharge, hoarse voice and sore throat.   Eyes: Negative for discharge, redness, vision loss in right eye and visual halos.  Cardiovascular: Negative for chest pain, dyspnea on exertion, leg swelling, orthopnea and palpitations.  Respiratory: Negative for cough, hemoptysis, shortness of breath and snoring.   Endocrine: Negative for heat intolerance and polyphagia.  Hematologic/Lymphatic: Negative  for bleeding problem. Does not bruise/bleed easily.  Skin: Negative for flushing, nail changes, rash and suspicious lesions.  Musculoskeletal: Negative for arthritis, joint pain, muscle cramps, myalgias, neck pain and stiffness.  Gastrointestinal: Negative for abdominal pain, bowel incontinence, diarrhea and excessive appetite.  Genitourinary: Negative for decreased libido, genital sores and incomplete emptying.  Neurological: Negative for brief paralysis, focal weakness, headaches and loss of balance.  Psychiatric/Behavioral: Negative for altered mental status, depression and suicidal ideas.  Allergic/Immunologic: Negative for HIV exposure and persistent infections.    EKGs/Labs/Other Studies Reviewed:    The following studies were reviewed today:   EKG:  The ekg ordered today demonstrates   Recent Labs: 12/29/2021: Magnesium 1.7 11/02/2022: ALT 25; BUN 6; Creatinine, Ser 0.58; Hemoglobin 11.9; Platelets 339; Potassium 3.7; Sodium 136 11/30/2022: TSH 0.46  Recent Lipid Panel    Component Value Date/Time   CHOL 172 12/29/2021 0919   TRIG 219 (H) 12/29/2021 0919   HDL 34 (L) 12/29/2021 0919   CHOLHDL 5.1 (H) 12/29/2021 0919   LDLCALC 100 (H) 12/29/2021 0919    Physical Exam:    VS:  BP (!) 160/98  (BP Location: Left Arm, Patient Position: Sitting, Cuff Size: Large)   Pulse 84   Ht 5' 7.5" (1.715 m)   Wt 274 lb (124.3 kg)   BMI 42.28 kg/m     Wt Readings from Last 3 Encounters:  12/18/22 274 lb (124.3 kg)  11/30/22 282 lb (127.9 kg)  11/14/22 289 lb (131.1 kg)     GEN: Well nourished, well developed in no acute distress HEENT: Normal NECK: No JVD; No carotid bruits LYMPHATICS: No lymphadenopathy CARDIAC: S1S2 noted,RRR, no murmurs, rubs, gallops RESPIRATORY:  Clear to auscultation without rales, wheezing or rhonchi  ABDOMEN: Soft, non-tender, non-distended, +bowel sounds, no guarding. EXTREMITIES: No edema, No cyanosis, no clubbing MUSCULOSKELETAL:  No deformity  SKIN: Warm and dry NEUROLOGIC:  Alert and oriented x 3, non-focal PSYCHIATRIC:  Normal affect, good insight  ASSESSMENT:    1. Medication management   2. Hypertensive heart disease without heart failure   3. History of CVA (cerebrovascular accident)   4. Type 2 diabetes mellitus with hyperglycemia, with long-term current use of insulin   5. Obesity, Class III, BMI 40-49.9 (morbid obesity)   6. Preoperative examination   7. Morbid obesity   8. Smoker    PLAN:     She is hypertensive in the office today.  She is currently on Coreg 25 mg twice daily, Lasix 20 mg daily, amlodipine 10 mg daily.  Spironolactone 50 mg daily and valsartan 320 mg daily.  I will bring back her hydrochlorothiazide we can monitor her kidney function closely.  We may also need to get the patient on clonidine if needed.  Will also follow-up with the pharmacy hypertension clinic as well as our advanced hypertension team.  She can continues to smoke despite the stroke.  She says she is thinking about quitting we talked about this.  She has declined to be referred to the smoking cessation program for now.  She will let me know.  The patient understands the need to lose weight with diet and exercise. We have discussed specific strategies for  this.  Echocardiogram was done in 2023.  Will plan to have this repeated by her next visit.  For now continue current medical therapy.  Diabetes is not well-controlled in February hemoglobin A1c was 11.9.  She has had some medication adjustments since.  With her CVA no residuals the weakness  has resolved we will continue with aspirin.  She follows nephrology for this.  Her dental surgery is somewhat urgent with an abscess as I am concerned that if we do not get this done sooner she may have a potential for bacteremia leading to endocarditis therefore the patient does not have any unstable cardiac conditions.  Upon evaluation today, she can achieve 4 METs or greater without anginal symptoms.  According to [facility] and AHA guidelines, she requires no further cardiac workup prior to her noncardiac surgery and should be at acceptable risk.  Our service is available as necessary in the perioperative period.  The patient is in agreement with the above plan. The patient left the office in stable condition.  The patient will follow up in 4 weeks or sooner if needed.   Medication Adjustments/Labs and Tests Ordered: Current medicines are reviewed at length with the patient today.  Concerns regarding medicines are outlined above.  Orders Placed This Encounter  Procedures   Comprehensive Metabolic Panel (CMET)   Magnesium   Meds ordered this encounter  Medications   hydrochlorothiazide (MICROZIDE) 12.5 MG capsule    Sig: Take 1 capsule (12.5 mg total) by mouth daily.    Dispense:  90 capsule    Refill:  3    Patient Instructions  Medication Instructions:  Your physician has recommended you make the following change in your medication:  START: Hydrochlorothiazide 12.5 mg once daily *If you need a refill on your cardiac medications before your next appointment, please call your pharmacy*   Lab Work: Your physician recommends that you have labs drawn today.: CMET, Mag If you have labs (blood work)  drawn today and your tests are completely normal, you will receive your results only by: [place] (if you have MyChart) OR A paper copy in the mail If you have any lab test that is abnormal or we need to change your treatment, we will call you to review the results.   Testing/Procedures: None   Follow-Up: At [facility], you and your health needs are our priority.  As part of our continuing mission to provide you with exceptional heart care, we have created designated Provider Care Teams.  These Care Teams include your primary Cardiologist (physician) and Advanced Practice Providers (APPs -  Physician Assistants and Nurse Practitioners) who all work together to provide you with the care you need, when you need it.   Your next appointment:   4 week(s) double book okay.   Provider:   Berniece Salines, DO      Adopting a Healthy Lifestyle.  Know what a healthy weight is for you (roughly BMI <25) and aim to maintain this   Aim for 7+ servings of fruits and vegetables daily   65-80+ fluid ounces of water or unsweet tea for healthy kidneys   Limit to max 1 drink of alcohol per day; avoid smoking/tobacco   Limit animal fats in diet for cholesterol and heart health - choose grass fed whenever available   Avoid highly processed foods, and foods high in saturated/trans fats   Aim for low stress - take time to unwind and care for your mental health   Aim for 150 min of moderate intensity exercise weekly for heart health, and weights twice weekly for bone health   Aim for 7-9 hours of sleep daily   When it comes to diets, agreement about the perfect plan isnt easy to find, even among the experts. Experts at the [employer]  [facility] developed an idea known as the Healthy Eating Plate. Just imagine a plate divided into logical, healthy portions.   The emphasis is on diet quality:   Load up on vegetables and fruits - one-half of your plate: Aim for color and  variety, and remember that potatoes dont count.   Go for whole grains - one-quarter of your plate: Whole wheat, barley, wheat berries, quinoa, oats, brown rice, and foods made with them. If you want pasta, go with whole wheat pasta.   Protein power - one-quarter of your plate: Fish, chicken, beans, and nuts are all healthy, versatile protein sources. Limit red meat.   The diet, however, does go beyond the plate, offering a few other suggestions.   Use healthy plant oils, such as olive, canola, soy, corn, sunflower and peanut. Check the labels, and avoid partially hydrogenated oil, which have unhealthy trans fats.   If youre thirsty, drink water. Coffee and tea are good in moderation, but skip sugary drinks and limit milk and dairy products to one or two daily servings.   The type of carbohydrate in the diet is more important than the amount. Some sources of carbohydrates, such as vegetables, fruits, whole grains, and beans-are healthier than others.   Finally, stay active  Signed, Berniece Salines, DO  12/19/2022 1:38 PM    [facility]

## 2022-12-19 NOTE — Telephone Encounter (Signed)
   Pre-operative Risk Assessment    Patient Name: Carolyn Zimmerman  DOB: [birth year] MRN: [id number]     Request for Surgical Clearance    Procedure:  Dental Extraction - Amount of Teeth to be Pulled:  2  Date of Surgery:  Clearance TBD                                 Surgeon:   Surgeon's Group or Practice Name:  [facility] Phone number:  [phone] Fax number:  [phone]   Type of Clearance Requested:   - Medical  - Pharmacy:  Hold Aspirin NOT INDICATED HOW LONG   Type of Anesthesia:  Local    Additional requests/questions:    Astrid Divine   12/19/2022, 9:20 AM

## 2022-12-21 ENCOUNTER — Encounter: Payer: Self-pay | Admitting: Pharmacist

## 2022-12-22 ENCOUNTER — Other Ambulatory Visit: Payer: Self-pay

## 2022-12-27 ENCOUNTER — Ambulatory Visit (HOSPITAL_BASED_OUTPATIENT_CLINIC_OR_DEPARTMENT_OTHER): Payer: No Typology Code available for payment source | Admitting: Cardiovascular Disease

## 2023-01-02 ENCOUNTER — Telehealth: Payer: Self-pay | Admitting: Pharmacist

## 2023-01-02 NOTE — Progress Notes (Signed)
Patient attempted to be outreached by Britney Brown, PharmD Candidate on 01/02/23 to discuss hypertension. Left voicemail for patient to return our call at their convenience at [phone].   Britney Brown, PharmD Candidate   Catie T. Ahmon Tosi, PharmD, BCACP, CPP [facility] [phone]  

## 2023-01-14 ENCOUNTER — Other Ambulatory Visit: Payer: Self-pay | Admitting: Cardiology

## 2023-01-22 ENCOUNTER — Encounter (HOSPITAL_BASED_OUTPATIENT_CLINIC_OR_DEPARTMENT_OTHER): Payer: Self-pay

## 2023-01-22 ENCOUNTER — Other Ambulatory Visit: Payer: Self-pay

## 2023-01-22 ENCOUNTER — Emergency Department (HOSPITAL_BASED_OUTPATIENT_CLINIC_OR_DEPARTMENT_OTHER): Payer: No Typology Code available for payment source

## 2023-01-22 ENCOUNTER — Ambulatory Visit: Payer: No Typology Code available for payment source | Attending: Cardiology | Admitting: Cardiology

## 2023-01-22 ENCOUNTER — Telehealth: Payer: Self-pay | Admitting: Cardiology

## 2023-01-22 ENCOUNTER — Emergency Department (HOSPITAL_BASED_OUTPATIENT_CLINIC_OR_DEPARTMENT_OTHER)
Admission: EM | Admit: 2023-01-22 | Discharge: 2023-01-22 | Disposition: A | Discharge status: Home / Self Care | Payer: No Typology Code available for payment source | Attending: Emergency Medicine | Admitting: Emergency Medicine

## 2023-01-22 VITALS — BP 178/110 | HR 82 | Ht 67.5 in | Wt 281.6 lb

## 2023-01-22 DIAGNOSIS — F419 Anxiety disorder, unspecified: Secondary | ICD-10-CM | POA: Diagnosis not present

## 2023-01-22 DIAGNOSIS — I110 Hypertensive heart disease with heart failure: Secondary | ICD-10-CM | POA: Insufficient documentation

## 2023-01-22 DIAGNOSIS — F32A Depression, unspecified: Secondary | ICD-10-CM

## 2023-01-22 DIAGNOSIS — R0602 Shortness of breath: Secondary | ICD-10-CM | POA: Diagnosis present

## 2023-01-22 DIAGNOSIS — I509 Heart failure, unspecified: Secondary | ICD-10-CM | POA: Insufficient documentation

## 2023-01-22 DIAGNOSIS — I119 Hypertensive heart disease without heart failure: Secondary | ICD-10-CM

## 2023-01-22 DIAGNOSIS — Z794 Long term (current) use of insulin: Secondary | ICD-10-CM | POA: Insufficient documentation

## 2023-01-22 DIAGNOSIS — I10 Essential (primary) hypertension: Secondary | ICD-10-CM

## 2023-01-22 DIAGNOSIS — Z7984 Long term (current) use of oral hypoglycemic drugs: Secondary | ICD-10-CM | POA: Diagnosis not present

## 2023-01-22 DIAGNOSIS — Z79899 Other long term (current) drug therapy: Secondary | ICD-10-CM | POA: Insufficient documentation

## 2023-01-22 DIAGNOSIS — E1165 Type 2 diabetes mellitus with hyperglycemia: Secondary | ICD-10-CM | POA: Diagnosis not present

## 2023-01-22 DIAGNOSIS — Z8673 Personal history of transient ischemic attack (TIA), and cerebral infarction without residual deficits: Secondary | ICD-10-CM

## 2023-01-22 DIAGNOSIS — R739 Hyperglycemia, unspecified: Secondary | ICD-10-CM

## 2023-01-22 LAB — COMPREHENSIVE METABOLIC PANEL
ALT: 17 U/L (ref 0–44)
AST: 18 U/L (ref 15–41)
Albumin: 2.8 g/dL — ABNORMAL LOW (ref 3.5–5.0)
Alkaline Phosphatase: 92 U/L (ref 38–126)
Anion gap: 8 (ref 5–15)
BUN: 12 mg/dL (ref 6–20)
CO2: 22 mmol/L (ref 22–32)
Calcium: 8.4 mg/dL — ABNORMAL LOW (ref 8.9–10.3)
Chloride: 99 mmol/L (ref 98–111)
Creatinine, Ser: 0.81 mg/dL (ref 0.44–1.00)
GFR, Estimated: >60 mL/min (ref >60)
Glucose, Bld: 416 mg/dL — ABNORMAL HIGH (ref 70–99)
Potassium: 3.3 mmol/L — ABNORMAL LOW (ref 3.5–5.1)
Sodium: 129 mmol/L — ABNORMAL LOW (ref 135–145)
Total Bilirubin: 0.2 mg/dL — ABNORMAL LOW (ref 0.3–1.2)
Total Protein: 7 g/dL (ref 6.5–8.1)

## 2023-01-22 LAB — CBC WITH DIFFERENTIAL/PLATELET
Abs Immature Granulocytes: 0.05 10*3/uL (ref 0.00–0.07)
Basophils Absolute: 0 10*3/uL (ref 0.0–0.1)
Basophils Relative: 0 %
Eosinophils Absolute: 0.1 10*3/uL (ref 0.0–0.5)
Eosinophils Relative: 1 %
HCT: 34.4 % — ABNORMAL LOW (ref 36.0–46.0)
Hemoglobin: 11.2 g/dL — ABNORMAL LOW (ref 12.0–15.0)
Immature Granulocytes: 0 %
Lymphocytes Relative: 33 %
Lymphs Abs: 3.7 10*3/uL (ref 0.7–4.0)
MCH: 26.5 pg (ref 26.0–34.0)
MCHC: 32.6 g/dL (ref 30.0–36.0)
MCV: 81.5 fL (ref 80.0–100.0)
Monocytes Absolute: 0.8 10*3/uL (ref 0.1–1.0)
Monocytes Relative: 7 %
Neutro Abs: 6.7 10*3/uL (ref 1.7–7.7)
Neutrophils Relative %: 59 %
Platelets: 337 10*3/uL (ref 150–400)
RBC: 4.22 MIL/uL (ref 3.87–5.11)
RDW: 13.6 % (ref 11.5–15.5)
WBC: 11.5 10*3/uL — ABNORMAL HIGH (ref 4.0–10.5)
nRBC: 0 % (ref 0.0–0.2)

## 2023-01-22 LAB — BRAIN NATRIURETIC PEPTIDE: B Natriuretic Peptide: 112.6 pg/mL — ABNORMAL HIGH (ref 0.0–100.0)

## 2023-01-22 LAB — CBG MONITORING, ED: Glucose-Capillary: 379 mg/dL — ABNORMAL HIGH (ref 70–99)

## 2023-01-22 LAB — TROPONIN I (HIGH SENSITIVITY)
Troponin I (High Sensitivity): 13 ng/L (ref <18)
Troponin I (High Sensitivity): 14 ng/L (ref <18)

## 2023-01-22 LAB — MAGNESIUM: Magnesium: 1.7 mg/dL (ref 1.6–2.3)

## 2023-01-22 MED ORDER — FUROSEMIDE 10 MG/ML IJ SOLN
20.0000 mg | Freq: Once | INTRAMUSCULAR | Status: AC
  Administered 2023-01-22: 20 mg via INTRAVENOUS
  Filled 2023-01-22: qty 2

## 2023-01-22 MED ORDER — INSULIN ASPART 100 UNIT/ML IJ SOLN
5.0000 [IU] | Freq: Once | INTRAMUSCULAR | Status: AC
  Administered 2023-01-22: 5 [IU] via SUBCUTANEOUS

## 2023-01-22 MED ORDER — POTASSIUM CHLORIDE CRYS ER 20 MEQ PO TBCR
40.0000 meq | EXTENDED_RELEASE_TABLET | Freq: Once | ORAL | Status: AC
  Administered 2023-01-22: 40 meq via ORAL
  Filled 2023-01-22: qty 2

## 2023-01-22 MED ORDER — FUROSEMIDE 20 MG PO TABS
ORAL_TABLET | ORAL | 3 refills | Status: DC
  Filled 2023-01-22: qty 270, fill #0
  Filled 2023-01-24: qty 270, 90d supply, fill #0
  Filled 2023-04-28: qty 270, 90d supply, fill #1

## 2023-01-22 NOTE — ED Triage Notes (Signed)
Pt reports SOB since Friday. Sats 100% in triage. Pt reports swelling to lower extremities since Thursday. On 20 mg lasix daily. Seen by cardiology today and lasix increased . Took first dose of 60 mg dose 930 am, breathing better until approx 345 pm while at work , became dizzy and SOB increased

## 2023-01-22 NOTE — Telephone Encounter (Signed)
Spoke with pt. Pt made aware the Lasix will take longer to kick in but however if she is having chest pressure she should be seen in the ED. She verbalized understanding, no further questions at this time.

## 2023-01-22 NOTE — Telephone Encounter (Signed)
Pt c/o medication issue:  1. Name of Medication:   furosemide (LASIX) 20 MG tablet    2. How are you currently taking this medication (dosage and times per day)?   Take 2 tablets (40 mg total) by mouth in the morning AND 1 tablet (20 mg total) Nightly.    3. Are you having a reaction (difficulty breathing--STAT)? No  4. What is your medication issue? Patient called stating that she saw Dr. Servando Salina today and was told to increase her fluid pill dosage. Patient stated that she is not noticing any different and would like to know what the next steps are. Please advise.

## 2023-01-22 NOTE — Discharge Instructions (Signed)
You were seen in the emergency department for your shortness of breath.  You had no signs of significant fluid on your lungs or stress on your heart.  Your blood sugar was high in the emergency department and you should continue to take your medications as prescribed and check your blood sugars.  You should also continue to take your increased Lasix dose as recommended by your cardiologist.  You should follow-up with your primary doctor or your cardiologist to have your symptoms and blood sugar rechecked in the next few days.  You should return to the emergency department for worsening shortness of breath, severe chest pain, repetitive vomiting or if you have any other new or concerning symptoms.

## 2023-01-22 NOTE — Patient Instructions (Signed)
Medication Instructions:  Your physician has recommended you make the following change in your medication:  START: Lasix 40 mg in the morning and 20 mg at night *If you need a refill on your cardiac medications before your next appointment, please call your pharmacy*   Lab Work: Your physician recommends that you have labs drawn today: CMET, Mag, HgbA1c If you have labs (blood work) drawn today and your tests are completely normal, you will receive your results only by: MyChart Message (if you have MyChart) OR A paper copy in the mail If you have any lab test that is abnormal or we need to change your treatment, we will call you to review the results.   Testing/Procedures: Your physician has requested that you have an echocardiogram. Echocardiography is a painless test that uses sound waves to create images of your heart. It provides your doctor with information about the size and shape of your heart and how well your heart's chambers and valves are working. This procedure takes approximately one hour. There are no restrictions for this procedure. Please do NOT wear cologne, perfume, aftershave, or lotions (deodorant is allowed). Please arrive 15 minutes prior to your appointment time.    Follow-Up: At [facility], you and your health needs are our priority.  As part of our continuing mission to provide you with exceptional heart care, we have created designated Provider Care Teams.  These Care Teams include your primary Cardiologist (physician) and Advanced Practice Providers (APPs -  Physician Assistants and Nurse Practitioners) who all work together to provide you with the care you need, when you need it.    Your next appointment:   16 week(s)  Provider:   Thomasene Ripple, DO

## 2023-01-22 NOTE — ED Provider Notes (Signed)
[facility] AT [facility] Provider Note   CSN: [id number] Arrival date & time: 01/22/23  1731     History  Chief Complaint  Patient presents with   Shortness of Breath    Evalyne Lacap is a 37 y.o. female.  Patient is a 37 year old female with a past medical history of hypertension, CHF, diabetes, prior CVA presenting to the emergency department with shortness of breath.  Patient states that she has had increasing shortness of breath since Saturday.  She states that she has been short of breath on exertion and reports orthopnea.  She states that she has had increasing swelling in her legs.  She states she was seen by her cardiologist today who recommended increasing her Lasix which she did this afternoon however when she tried to go to work she started to develop chest pressure and her shortness of breath got worse.  She denies any history of blood clots, recent hospitalizations or surgeries, hormone use, cancer history.  The history is provided by the patient.  Shortness of Breath      Home Medications Prior to Admission medications   Medication Sig Start Date End Date Taking? Authorizing Provider  amLODipine (NORVASC) 10 MG tablet Take 1 tablet (10 mg total) by mouth daily. 10/26/22  Yes Ivonne Andrew, NP  aspirin 81 MG chewable tablet Chew 1 tablet (81 mg total) by mouth daily. 10/26/22  Yes Ivonne Andrew, NP  furosemide (LASIX) 20 MG tablet Take 2 tablets (40 mg total) by mouth in the morning AND 1 tablet (20 mg total) Nightly. 01/22/23  Yes Tobb, Kardie, DO  acetaminophen (TYLENOL) 500 MG tablet Take 2 tablets (1,000 mg total) by mouth every 8 (eight) hours as needed for mild pain. 06/02/20   Kallie Locks, FNP  albuterol (VENTOLIN HFA) 108 (90 Base) MCG/ACT inhaler Inhale 2 puffs into the lungs every 4 (four) hours as needed for wheezing or shortness of breath. 06/02/20   Kallie Locks, FNP  blood glucose meter kit and supplies Dispense based  on patient and insurance preference. Use up to four times daily as directed. (FOR ICD-9 250.00, 250.01). 06/02/20   Kallie Locks, FNP  carvedilol (COREG) 25 MG tablet Take 1 tablet (25 mg total) by mouth 2 (two) times daily. 10/26/22   Ivonne Andrew, NP  hydrochlorothiazide (MICROZIDE) 12.5 MG capsule Take 1 capsule (12.5 mg total) by mouth daily. 12/18/22   Tobb, Kardie, DO  Insulin Glargine Solostar (LANTUS) 100 UNIT/ML Solostar Pen Inject 25 Units into the skin daily. 07/16/22   [provider]  insulin lispro (HUMALOG) 100 UNIT/ML KwikPen Inject 10 Units into the skin 3 (three) times daily. 07/16/22   [provider]  metFORMIN (GLUCOPHAGE-XR) 500 MG 24 hr tablet Take 2 tablets (1,000 mg total) by mouth in the morning and at bedtime. 10/26/22   Ivonne Andrew, NP  nicotine (NICODERM CQ - DOSED IN MG/24 HOURS) 14 mg/24hr patch APPLY 1 PATCH(14 MG) TOPICALLY TO THE SKIN DAILY 08/25/22   Tobb, Lavona Mound, DO  nicotine polacrilex (NICORETTE) 4 MG gum Take 1 each (4 mg total) by mouth as needed for smoking cessation. Use park and chew method as instructed. 05/26/22   Tobb, Kardie, DO  rosuvastatin (CRESTOR) 20 MG tablet Take 1 tablet (20 mg total) by mouth daily. 10/26/22   Ivonne Andrew, NP  spironolactone (ALDACTONE) 50 MG tablet Take 1 tablet (50 mg total) by mouth daily. 11/14/22   Flossie Dibble,  NP  valsartan (DIOVAN) 320 MG tablet Take 1 tablet (320 mg total) by mouth daily. 10/26/22   Ivonne Andrew, NP      Allergies    Contrast media [iodinated contrast media]    Review of Systems   Review of Systems  Respiratory:  Positive for shortness of breath.     Physical Exam Updated Vital Signs BP (!) 164/83   Pulse 71   Temp 98.1 F (36.7 C) (Oral)   Resp 14   Ht 5\' 7"  (1.702 m)   Wt 122.9 kg   LMP 01/16/2023   SpO2 100%   BMI 42.44 kg/m  Physical Exam Vitals and nursing note reviewed.  Constitutional:      General: She is not in acute distress.    Appearance:  She is well-developed.  HENT:     Head: Normocephalic and atraumatic.     Mouth/Throat:     Mouth: Mucous membranes are moist.     Pharynx: Oropharynx is clear.  Eyes:     Extraocular Movements: Extraocular movements intact.  Cardiovascular:     Rate and Rhythm: Normal rate and regular rhythm.     Heart sounds: Normal heart sounds.  Pulmonary:     Effort: Tachypnea (Conversational dyspnea) present.     Breath sounds: Normal breath sounds.  Abdominal:     Palpations: Abdomen is soft.  Musculoskeletal:        General: Normal range of motion.     Cervical back: Normal range of motion and neck supple.     Right lower leg: Edema (1+ to shins) present.     Left lower leg: Edema (1+ to shins) present.  Neurological:     Mental Status: She is alert.     ED Results / Procedures / Treatments   Labs (all labs ordered are listed, but only abnormal results are displayed) Labs Reviewed  COMPREHENSIVE METABOLIC PANEL - Abnormal; Notable for the following components:      Result Value   Sodium 129 (*)    Potassium 3.3 (*)    Glucose, Bld 416 (*)    Calcium 8.4 (*)    Albumin 2.8 (*)    Total Bilirubin 0.2 (*)    All other components within normal limits  CBC WITH DIFFERENTIAL/PLATELET - Abnormal; Notable for the following components:   WBC 11.5 (*)    Hemoglobin 11.2 (*)    HCT 34.4 (*)    All other components within normal limits  BRAIN NATRIURETIC PEPTIDE - Abnormal; Notable for the following components:   B Natriuretic Peptide 112.6 (*)    All other components within normal limits  CBG MONITORING, ED - Abnormal; Notable for the following components:   Glucose-Capillary 379 (*)    All other components within normal limits  TROPONIN I (HIGH SENSITIVITY)  TROPONIN I (HIGH SENSITIVITY)    EKG EKG Interpretation  Date/Time:  Monday Jan 22 2023 17:37:03 EDT Ventricular Rate:  90 PR Interval:  164 QRS Duration: 107 QT Interval:  396 QTC Calculation: 485 R Axis:   62 Text  Interpretation: Sinus rhythm Consider left atrial enlargement Borderline T wave abnormalities Borderline prolonged QT interval No significant change since last tracing Confirmed by Elayne Snare ([id number]) on 01/22/2023 5:51:12 PM  Radiology DG Chest Port 1 View  Result Date: 01/22/2023 CLINICAL DATA:  Several day history of shortness of breath EXAM: PORTABLE CHEST 1 VIEW COMPARISON:  Chest radiograph dated 06/28/2022 FINDINGS: Normal lung volumes. Patchy left retrocardiac opacities. No pleural effusion or  pneumothorax. Similar mildly enlarged cardiomediastinal silhouette. No acute osseous abnormality. IMPRESSION: 1. Patchy left retrocardiac opacities, which could represent atelectasis or infection. 2. Similar mild cardiomegaly. Electronically Signed   By: Agustin Cree M.D.   On: 01/22/2023 18:04    Procedures Procedures    Medications Ordered in ED Medications  potassium chloride SA (KLOR-CON M) CR tablet 40 mEq (40 mEq Oral Given 01/22/23 2025)  insulin aspart (novoLOG) injection 5 Units (5 Units Subcutaneous Given 01/22/23 2026)  furosemide (LASIX) injection 20 mg (20 mg Intravenous Given 01/22/23 2056)    ED Course/ Medical Decision Making/ A&P Clinical Course as of 01/22/23 2243  Mon Jan 22, 2023  1942 Mild hypokalemia which will be repleted. Hyperglycemia without evidence of DKA, she will be given 5 u SQ insulin. [VK]  2037 BNP minimally elevated. She will be given IV lasix and repeat troponin in process. No signs of severe volume overload and no longer dyspneic on exam.  [VK]  2241 Repeat troponin is negative.  Patient reports improvement of her symptoms and is stable for discharge home with primary care and cardiology follow-up and was given strict return precautions. [VK]    Clinical Course User Index [VK] Rexford Maus, DO                             Medical Decision Making This patient presents to the ED with chief complaint(s) of shortness of breath with pertinent past medical  history of hypertension, CHF, diabetes, prior CVA which further complicates the presenting complaint. The complaint involves an extensive differential diagnosis and also carries with it a high risk of complications and morbidity.    The differential diagnosis includes ACS, arrhythmia, anemia, pneumonia, pneumothorax, pulmonary edema, pleural effusion, CHF exacerbation, has no wheezing on exam making asthma exacerbation unlikely  Additional history obtained: Additional history obtained from N/A Records reviewed outpatient cardiology records  ED Course and Reassessment: On patient's arrival to the emergency department she was hemodynamically stable, mildly tachypneic and conversationally dyspneic though satting well on room air.  Patient had EKG performed on arrival that showed normal sinus rhythm without acute ischemic changes.  She will have chest x-ray and labs including troponin and BNP and will be closely reassessed.  Independent labs interpretation:  The following labs were independently interpreted: hyperglycemia without DKA, mild hypokalemia, mildly elevated BNP  Independent visualization of imaging: - I independently visualized the following imaging with scope of interpretation limited to determining acute life threatening conditions related to emergency care: CXR, which revealed no acute disease  Consultation: - Consulted or discussed management/test interpretation w/ external professional: N/A  Consideration for admission or further workup: Patient has no emergent conditions requiring admission or further work-up at this time and is stable for discharge home with primary care follow-up  Social Determinants of health: N/A    Amount and/or Complexity of Data Reviewed Labs: ordered. Radiology: ordered.  Risk Prescription drug management.          Final Clinical Impression(s) / ED Diagnoses Final diagnoses:  Hyperglycemia  Shortness of breath    Rx / DC Orders ED  Discharge Orders     None         Rexford Maus, DO 01/22/23 2243

## 2023-01-23 ENCOUNTER — Telehealth: Payer: Self-pay

## 2023-01-23 LAB — COMPREHENSIVE METABOLIC PANEL
ALT: 15 IU/L (ref 0–32)
AST: 14 IU/L (ref 0–40)
Albumin/Globulin Ratio: 1.1 — ABNORMAL LOW (ref 1.2–2.2)
Albumin: 3.5 g/dL — ABNORMAL LOW (ref 3.9–4.9)
Alkaline Phosphatase: 117 IU/L (ref 44–121)
BUN/Creatinine Ratio: 13 (ref 9–23)
BUN: 9 mg/dL (ref 6–20)
Bilirubin Total: 0.3 mg/dL (ref 0.0–1.2)
CO2: 25 mmol/L (ref 20–29)
Calcium: 9.2 mg/dL (ref 8.7–10.2)
Chloride: 97 mmol/L (ref 96–106)
Creatinine, Ser: 0.71 mg/dL (ref 0.57–1.00)
Globulin, Total: 3.2 g/dL (ref 1.5–4.5)
Glucose: 417 mg/dL — ABNORMAL HIGH (ref 70–99)
Potassium: 4.5 mmol/L (ref 3.5–5.2)
Sodium: 135 mmol/L (ref 134–144)
Total Protein: 6.7 g/dL (ref 6.0–8.5)
eGFR: 114 mL/min/{1.73_m2} (ref >59)

## 2023-01-23 LAB — HEMOGLOBIN A1C
Est. average glucose Bld gHb Est-mCnc: 326 mg/dL
Hgb A1c MFr Bld: 13 % — ABNORMAL HIGH (ref 4.8–5.6)

## 2023-01-23 NOTE — Transitions of Care (Post Inpatient/ED Visit) (Cosign Needed)
01/23/2023  Name: Carolyn Zimmerman MRN: [id number] DOB: 22-Jul-1986  Today's TOC FU Call Status: Today's TOC FU Call Status:: Successful TOC FU Call Competed TOC FU Call Complete Date: 01/23/23  Transition Care Management Follow-up Telephone Call Date of Discharge: 01/22/23 Discharge Facility: [facility] Type of Discharge: Emergency Department Reason for ED Visit: Cardiac Conditions How have you been since you were released from the hospital?: Same Any questions or concerns?: No  Items Reviewed: Did you receive and understand the discharge instructions provided?: Yes Medications obtained,verified, and reconciled?: Yes (Medications Reviewed) Any new allergies since your discharge?: No Do you have support at home?: Yes People in Home: other relative(s), spouse  Medications Reviewed Today: Medications Reviewed Today     Reviewed by Renelda Loma, RMA (Registered Medical Assistant) on 01/23/23 at 1404  Med List Status: <None>   Medication Order Taking? Sig Documenting Provider Last Dose Status Informant  acetaminophen (TYLENOL) 500 MG tablet [id number] Yes Take 2 tablets (1,000 mg total) by mouth every 8 (eight) hours as needed for mild pain. Kallie Locks, FNP Taking Active Self           Med Note Renelda Loma   Thu Nov 02, 2022  9:32 AM) Prn   albuterol (VENTOLIN HFA) 108 [phone] Base) MCG/ACT inhaler [id number] Yes Inhale 2 puffs into the lungs every 4 (four) hours as needed for wheezing or shortness of breath. Kallie Locks, FNP Taking Active Self  amLODipine (NORVASC) 10 MG tablet [id number] Yes Take 1 tablet (10 mg total) by mouth daily. Ivonne Andrew, NP Taking Active   aspirin 81 MG chewable tablet [id number] Yes Chew 1 tablet (81 mg total) by mouth daily. Ivonne Andrew, NP Taking Active   blood glucose meter kit and supplies [id number] Yes Dispense based on patient and insurance preference. Use up to four times daily as directed. (FOR ICD-9 250.00, 250.01).  Kallie Locks, FNP Taking Active Self  carvedilol (COREG) 25 MG tablet [id number] Yes Take 1 tablet (25 mg total) by mouth 2 (two) times daily. Ivonne Andrew, NP Taking Active   cloNIDine (CATAPRES) tablet 0.2 mg [id number]   Tobb, Lavona Mound, DO  Active   furosemide (LASIX) 20 MG tablet [id number] Yes Take 2 tablets (40 mg total) by mouth in the morning AND 1 tablet (20 mg total) Nightly. Tobb, Kardie, DO Taking Active   hydrochlorothiazide (MICROZIDE) 12.5 MG capsule [id number] Yes Take 1 capsule (12.5 mg total) by mouth daily. Tobb, Kardie, DO Taking Active   Insulin Glargine Solostar (LANTUS) 100 UNIT/ML Solostar Pen [id number] Yes Inject 25 Units into the skin daily. [provider] Taking Active   insulin lispro (HUMALOG) 100 UNIT/ML KwikPen [id number] Yes Inject 10 Units into the skin 3 (three) times daily. [provider] Taking Active            Med Note Clearance Coots, Alanda Slim Oct 12, 2022  2:12 PM) Sliding Scale: Administer an extra 2 units for every reading 50> than 150. 200: 2 units 250: 4 units 300: 6 units 350: 8 units 400: Seek medical care  metFORMIN (GLUCOPHAGE-XR) 500 MG 24 hr tablet [id number] Yes Take 2 tablets (1,000 mg total) by mouth in the morning and at bedtime. Ivonne Andrew, NP Taking Active   nicotine (NICODERM CQ - DOSED IN MG/24 HOURS) 14 mg/24hr patch [id number] No APPLY 1 PATCH(14 MG) TOPICALLY TO THE SKIN DAILY  Patient not taking: Reported on 01/23/2023   Thomasene Ripple, DO Not Taking  Active   nicotine polacrilex (NICORETTE) 4 MG gum [id number] No Take 1 each (4 mg total) by mouth as needed for smoking cessation. Use park and chew method as instructed.  Patient not taking: Reported on 01/23/2023   Thomasene Ripple, DO Not Taking Active   rosuvastatin (CRESTOR) 20 MG tablet [id number] Yes Take 1 tablet (20 mg total) by mouth daily. Ivonne Andrew, NP Taking Active   spironolactone (ALDACTONE) 50 MG tablet [id number] Yes Take 1 tablet (50 mg total) by mouth  daily. Flossie Dibble, NP Taking Active   valsartan (DIOVAN) 320 MG tablet [id number] Yes Take 1 tablet (320 mg total) by mouth daily. Ivonne Andrew, NP Taking Active             Home Care and Equipment/Supplies: Were Home Health Services Ordered?: NA Any new equipment or medical supplies ordered?: NA  Functional Questionnaire: Do you need assistance with bathing/showering or dressing?: No Do you need assistance with meal preparation?: No Do you need assistance with eating?: No Do you have difficulty maintaining continence: No Do you need assistance with getting out of bed/getting out of a chair/moving?: No Do you have difficulty managing or taking your medications?: No  Follow up appointments reviewed: Specialist Hospital Follow-up appointment confirmed?: Yes Follow-Up Specialty Provider:: cardiology Do you need transportation to your follow-up appointment?: Yes Do you understand care options if your condition(s) worsen?: Yes-patient verbalized understanding    SIGNATURE Renelda Loma RMA

## 2023-01-24 ENCOUNTER — Other Ambulatory Visit: Payer: Self-pay

## 2023-01-24 NOTE — Progress Notes (Signed)
Cardiology Office Note:    Date:  01/24/2023   ID:  Carolyn Zimmerman, DOB 10-11-1985, MRN [id number]  PCP:  Ivonne Andrew, NP  Cardiologist:  Thomasene Ripple, DO  Electrophysiologist:  None   Referring MD: Ivonne Andrew, NP   Chief Complaint  Patient presents with   Palpitations    History of Present Illness:    Carolyn Zimmerman is a 37 y.o. female with a hx of uncontrolled hypertension, CVA in October 2023 acute left cerebral hemisphere compatible with watershed infarct, diabetes mellitus, heart failure with midrange ejection fraction, smoker, obesity, hyperlipidemia.  I last saw the patient August 2023 at that time she was dealing with multiple stressors.  I referred her to our hypertensive clinic as well, behavioral health, endocrinology, family practice as well as care navigation.  Also refer the patient for renal denervation patient was noted to not be a candidate.  Of note prior to her last visit with me she had  not followed up with heart care in our other clinics including the pharmacy hypertension clinic.  She also had had stroke when she thankfully did not have any residuals.  On her visit on December 18, 2022 she was hypertensive she tells me that she is taking her medications.  She was on Coreg 25 mg twice a day, Lasix 20 mg daily, amlodipine 10 mg daily, spironolactone 50 mg daily, valsartan 320 mg daily, I restarted her hydrochlorothiazide.  She had not tolerated clonidine in the past.  I set her up to follow-up with our pharmacy hypertension clinic as well as our advanced hypertension team.  Since her visit she tells me she has been under a lot of stress.  She reports that she is experiencing significant shortness of breath.  Mostly when she lies down she feels that she is short of breath.  Is persistent.  Past Medical History:  Diagnosis Date   Abscess of chest wall 06/15/2017   Coronavirus infection 05/02/2020   CVA (cerebral vascular accident) (HCC) 07/14/2022   L MCA CVA,  watershed infarct   Diabetes mellitus    Heart failure with mildly reduced ejection fraction (HFmrEF) (HCC) 09/29/2021   EF 45%, moderate LVH   Hyperlipidemia associated with type 2 diabetes mellitus (HCC)    Hypertension    Jaundice    Obesity    TOA (tubo-ovarian abscess) 09/2019    Past Surgical History:  Procedure Laterality Date   CHOLECYSTECTOMY     IRRIGATION AND DEBRIDEMENT ABSCESS N/A 05/02/2020   Procedure: IRRIGATION AND DEBRIDEMENT ABDOMINAL and CHEST WALL NECROTIZING FASCITITS;  Surgeon: Karie Soda, MD;  Location: [facility];  Service: General;  Laterality: N/A;   liver stent      Current Medications: Current Meds  Medication Sig   acetaminophen (TYLENOL) 500 MG tablet Take 2 tablets (1,000 mg total) by mouth every 8 (eight) hours as needed for mild pain.   albuterol (VENTOLIN HFA) 108 (90 Base) MCG/ACT inhaler Inhale 2 puffs into the lungs every 4 (four) hours as needed for wheezing or shortness of breath.   amLODipine (NORVASC) 10 MG tablet Take 1 tablet (10 mg total) by mouth daily.   aspirin 81 MG chewable tablet Chew 1 tablet (81 mg total) by mouth daily.   blood glucose meter kit and supplies Dispense based on patient and insurance preference. Use up to four times daily as directed. (FOR ICD-9 250.00, 250.01).   carvedilol (COREG) 25 MG tablet Take 1 tablet (25 mg total) by mouth 2 (two) times daily.  hydrochlorothiazide (MICROZIDE) 12.5 MG capsule Take 1 capsule (12.5 mg total) by mouth daily.   Insulin Glargine Solostar (LANTUS) 100 UNIT/ML Solostar Pen Inject 25 Units into the skin daily.   insulin lispro (HUMALOG) 100 UNIT/ML KwikPen Inject 10 Units into the skin 3 (three) times daily.   metFORMIN (GLUCOPHAGE-XR) 500 MG 24 hr tablet Take 2 tablets (1,000 mg total) by mouth in the morning and at bedtime.   nicotine (NICODERM CQ - DOSED IN MG/24 HOURS) 14 mg/24hr patch APPLY 1 PATCH(14 MG) TOPICALLY TO THE SKIN DAILY (Patient not taking: Reported on 01/23/2023)    nicotine polacrilex (NICORETTE) 4 MG gum Take 1 each (4 mg total) by mouth as needed for smoking cessation. Use park and chew method as instructed. (Patient not taking: Reported on 01/23/2023)   rosuvastatin (CRESTOR) 20 MG tablet Take 1 tablet (20 mg total) by mouth daily.   spironolactone (ALDACTONE) 50 MG tablet Take 1 tablet (50 mg total) by mouth daily.   valsartan (DIOVAN) 320 MG tablet Take 1 tablet (320 mg total) by mouth daily.   [DISCONTINUED] furosemide (LASIX) 20 MG tablet Take 1 tablet (20 mg total) by mouth daily.   Current Facility-Administered Medications for the 01/22/23 encounter (Office Visit) with Thomasene Ripple, DO  Medication   cloNIDine (CATAPRES) tablet 0.2 mg     Allergies:   Contrast media [iodinated contrast media]   Social History   Socioeconomic History   Marital status: Single    Spouse name: Not on file   Number of children: Not on file   Years of education: Not on file   Highest education level: Not on file  Occupational History   Not on file  Tobacco Use   Smoking status: Former    Packs/day: .5    Types: Cigarettes   Smokeless tobacco: Never  Vaping Use   Vaping Use: Former  Substance and Sexual Activity   Alcohol use: Not Currently    Comment: occ   Drug use: No   Sexual activity: Not on file  Other Topics Concern   Not on file  Social History Narrative   Are you right handed or left handed? right   Are you currently employed ? yes   What is your current occupation?[employer]   Do you live at home alone?with kids   Who lives with you?    What type of home do you live in: 1 story or 2 story? two    Caffeine 1-3 a day   Social Determinants of Health   Financial Resource Strain: Medium Risk (04/28/2022)   Overall Financial Resource Strain (CARDIA)    Difficulty of Paying Living Expenses: Somewhat hard  Food Insecurity: No Food Insecurity (04/28/2022)   Hunger Vital Sign    Worried About Running Out of Food in the Last Year: Never true    Ran Out  of Food in the Last Year: Never true  Transportation Needs: No Transportation Needs (04/28/2022)   PRAPARE - Administrator, Civil Service (Medical): No    Lack of Transportation (Non-Medical): No  Physical Activity: Not on file  Stress: Stress Concern Present (04/28/2022)   [employer] of Occupational Health - Occupational Stress Questionnaire    Feeling of Stress : Rather much  Social Connections: Not on file     Family History: The patient's family history includes Diabetes in her father; Hypertension in her father and mother.  ROS:   Review of Systems  Constitution: Negative for decreased appetite, fever and weight  gain.  HENT: Negative for congestion, ear discharge, hoarse voice and sore throat.   Eyes: Negative for discharge, redness, vision loss in right eye and visual halos.  Cardiovascular: Negative for chest pain, dyspnea on exertion, leg swelling, orthopnea and palpitations.  Respiratory: Negative for cough, hemoptysis, shortness of breath and snoring.   Endocrine: Negative for heat intolerance and polyphagia.  Hematologic/Lymphatic: Negative for bleeding problem. Does not bruise/bleed easily.  Skin: Negative for flushing, nail changes, rash and suspicious lesions.  Musculoskeletal: Negative for arthritis, joint pain, muscle cramps, myalgias, neck pain and stiffness.  Gastrointestinal: Negative for abdominal pain, bowel incontinence, diarrhea and excessive appetite.  Genitourinary: Negative for decreased libido, genital sores and incomplete emptying.  Neurological: Negative for brief paralysis, focal weakness, headaches and loss of balance.  Psychiatric/Behavioral: Negative for altered mental status, depression and suicidal ideas.  Allergic/Immunologic: Negative for HIV exposure and persistent infections.    EKGs/Labs/Other Studies Reviewed:    The following studies were reviewed today:   EKG: None today  Recent Labs: 11/30/2022: TSH 0.46 01/22/2023:  ALT 17; B Natriuretic Peptide 112.6; BUN 12; Creatinine, Ser 0.81; Hemoglobin 11.2; Magnesium 1.7; Platelets 337; Potassium 3.3; Sodium 129  Recent Lipid Panel    Component Value Date/Time   CHOL 172 12/29/2021 0919   TRIG 219 (H) 12/29/2021 0919   HDL 34 (L) 12/29/2021 0919   CHOLHDL 5.1 (H) 12/29/2021 0919   LDLCALC 100 (H) 12/29/2021 0919    Physical Exam:    VS:  BP (!) 178/110 (BP Location: Left Arm)   Pulse 82   Ht 5' 7.5" (1.715 m)   Wt 281 lb 9.6 oz (127.7 kg)   LMP 01/16/2023   SpO2 99%   BMI 43.45 kg/m     Wt Readings from Last 3 Encounters:  01/22/23 271 lb (122.9 kg)  01/22/23 281 lb 9.6 oz (127.7 kg)  12/18/22 274 lb (124.3 kg)     GEN: Well nourished, well developed in no acute distress HEENT: Normal NECK: No JVD; No carotid bruits LYMPHATICS: No lymphadenopathy CARDIAC: S1S2 noted,RRR, no murmurs, rubs, gallops RESPIRATORY:  Clear to auscultation without rales, wheezing or rhonchi  ABDOMEN: Soft, non-tender, non-distended, +bowel sounds, no guarding. EXTREMITIES: No edema, No cyanosis, no clubbing MUSCULOSKELETAL:  No deformity  SKIN: Warm and dry NEUROLOGIC:  Alert and oriented x 3, non-focal PSYCHIATRIC:  Normal affect, good insight  ASSESSMENT:    1. SOB (shortness of breath)   2. Type 2 diabetes mellitus with hyperglycemia, with long-term current use of insulin (HCC)   3. Medication management   4. Anxiety   5. Depression, unspecified depression type   6. Accelerated hypertension   7. Hypertensive heart disease without heart failure   8. History of CVA (cerebrovascular accident)   9. Morbid obesity (HCC)    PLAN:    Will get an echocardiogram to reassess her LV function.  With the shortness of breath and leg swelling I am going to increase her Lasix to 40 mg in the morning as well as 20 mg in the afternoon.  She can continues to smoke despite the stroke.  She says she is thinking about quitting we talked about this.  She has declined to be  referred to the smoking cessation program for now.  She will let me know.  The patient understands the need to lose weight with diet and exercise. We have discussed specific strategies for this.   Diabetes is not well-controlled in February hemoglobin A1c was 11.9 we will repeat her  hemoglobin A1c.  She has had some medication adjustments since.  I am going to refer the patient to endocrine.  She is under a lot of stress/anxiety I would like to refer her to our behavioral health specialist as well.  With her CVA no residuals the weakness has resolved we will continue with aspirin.    Will get c-Met in mag to assess creatinine.  The patient is in agreement with the above plan. The patient left the office in stable condition.  The patient will follow up in 16 weeks or sooner if needed.  She also follow-up with our pharmacy team.   Medication Adjustments/Labs and Tests Ordered: Current medicines are reviewed at length with the patient today.  Concerns regarding medicines are outlined above.  Orders Placed This Encounter  Procedures   Comprehensive Metabolic Panel (CMET)   Magnesium   Hemoglobin A1c   Ambulatory referral to Endocrinology   Ambulatory referral to Behavioral Health   ECHOCARDIOGRAM COMPLETE   Meds ordered this encounter  Medications   furosemide (LASIX) 20 MG tablet    Sig: Take 2 tablets (40 mg total) by mouth in the morning AND 1 tablet (20 mg total) Nightly.    Dispense:  270 tablet    Refill:  3    Patient Instructions  Medication Instructions:  Your physician has recommended you make the following change in your medication:  START: Lasix 40 mg in the morning and 20 mg at night *If you need a refill on your cardiac medications before your next appointment, please call your pharmacy*   Lab Work: Your physician recommends that you have labs drawn today: CMET, Mag, HgbA1c If you have labs (blood work) drawn today and your tests are completely normal, you will  receive your results only by: MyChart Message (if you have MyChart) OR A paper copy in the mail If you have any lab test that is abnormal or we need to change your treatment, we will call you to review the results.   Testing/Procedures: Your physician has requested that you have an echocardiogram. Echocardiography is a painless test that uses sound waves to create images of your heart. It provides your doctor with information about the size and shape of your heart and how well your heart's chambers and valves are working. This procedure takes approximately one hour. There are no restrictions for this procedure. Please do NOT wear cologne, perfume, aftershave, or lotions (deodorant is allowed). Please arrive 15 minutes prior to your appointment time.    Follow-Up: At [facility], you and your health needs are our priority.  As part of our continuing mission to provide you with exceptional heart care, we have created designated Provider Care Teams.  These Care Teams include your primary Cardiologist (physician) and Advanced Practice Providers (APPs -  Physician Assistants and Nurse Practitioners) who all work together to provide you with the care you need, when you need it.    Your next appointment:   16 week(s)  Provider:   Thomasene Ripple, DO    Adopting a Healthy Lifestyle.  Know what a healthy weight is for you (roughly BMI <25) and aim to maintain this   Aim for 7+ servings of fruits and vegetables daily   65-80+ fluid ounces of water or unsweet tea for healthy kidneys   Limit to max 1 drink of alcohol per day; avoid smoking/tobacco   Limit animal fats in diet for cholesterol and heart health - choose grass fed whenever available  Avoid highly processed foods, and foods high in saturated/trans fats   Aim for low stress - take time to unwind and care for your mental health   Aim for 150 min of moderate intensity exercise weekly for heart health, and weights twice  weekly for bone health   Aim for 7-9 hours of sleep daily   When it comes to diets, agreement about the perfect plan isnt easy to find, even among the experts. Experts at the [facility] [employer] developed an idea known as the Healthy Eating Plate. Just imagine a plate divided into logical, healthy portions.   The emphasis is on diet quality:   Load up on vegetables and fruits - one-half of your plate: Aim for color and variety, and remember that potatoes dont count.   Go for whole grains - one-quarter of your plate: Whole wheat, barley, wheat berries, quinoa, oats, brown rice, and foods made with them. If you want pasta, go with whole wheat pasta.   Protein power - one-quarter of your plate: Fish, chicken, beans, and nuts are all healthy, versatile protein sources. Limit red meat.   The diet, however, does go beyond the plate, offering a few other suggestions.   Use healthy plant oils, such as olive, canola, soy, corn, sunflower and peanut. Check the labels, and avoid partially hydrogenated oil, which have unhealthy trans fats.   If youre thirsty, drink water. Coffee and tea are good in moderation, but skip sugary drinks and limit milk and dairy products to one or two daily servings.   The type of carbohydrate in the diet is more important than the amount. Some sources of carbohydrates, such as vegetables, fruits, whole grains, and beans-are healthier than others.   Finally, stay active  Signed, Thomasene Ripple, DO  01/24/2023 9:27 AM    [facility]

## 2023-01-25 ENCOUNTER — Other Ambulatory Visit: Payer: Self-pay

## 2023-01-30 ENCOUNTER — Other Ambulatory Visit: Payer: Self-pay

## 2023-01-30 ENCOUNTER — Emergency Department (HOSPITAL_BASED_OUTPATIENT_CLINIC_OR_DEPARTMENT_OTHER)
Admission: EM | Admit: 2023-01-30 | Discharge: 2023-01-30 | Disposition: A | Discharge status: Home / Self Care | Payer: No Typology Code available for payment source | Attending: Emergency Medicine | Admitting: Emergency Medicine

## 2023-01-30 ENCOUNTER — Emergency Department (HOSPITAL_BASED_OUTPATIENT_CLINIC_OR_DEPARTMENT_OTHER): Payer: No Typology Code available for payment source

## 2023-01-30 ENCOUNTER — Telehealth: Payer: Self-pay

## 2023-01-30 ENCOUNTER — Encounter (HOSPITAL_BASED_OUTPATIENT_CLINIC_OR_DEPARTMENT_OTHER): Payer: Self-pay

## 2023-01-30 DIAGNOSIS — S199XXA Unspecified injury of neck, initial encounter: Secondary | ICD-10-CM | POA: Diagnosis present

## 2023-01-30 DIAGNOSIS — R519 Headache, unspecified: Secondary | ICD-10-CM | POA: Diagnosis not present

## 2023-01-30 DIAGNOSIS — M25511 Pain in right shoulder: Secondary | ICD-10-CM | POA: Diagnosis not present

## 2023-01-30 DIAGNOSIS — Z79899 Other long term (current) drug therapy: Secondary | ICD-10-CM | POA: Insufficient documentation

## 2023-01-30 DIAGNOSIS — Z794 Long term (current) use of insulin: Secondary | ICD-10-CM | POA: Diagnosis not present

## 2023-01-30 DIAGNOSIS — Z7982 Long term (current) use of aspirin: Secondary | ICD-10-CM | POA: Insufficient documentation

## 2023-01-30 DIAGNOSIS — M25512 Pain in left shoulder: Secondary | ICD-10-CM | POA: Diagnosis not present

## 2023-01-30 DIAGNOSIS — S161XXA Strain of muscle, fascia and tendon at neck level, initial encounter: Secondary | ICD-10-CM | POA: Insufficient documentation

## 2023-01-30 DIAGNOSIS — Y92410 Unspecified street and highway as the place of occurrence of the external cause: Secondary | ICD-10-CM | POA: Diagnosis not present

## 2023-01-30 DIAGNOSIS — R42 Dizziness and giddiness: Secondary | ICD-10-CM | POA: Diagnosis not present

## 2023-01-30 MED ORDER — CYCLOBENZAPRINE HCL 10 MG PO TABS
10.0000 mg | ORAL_TABLET | Freq: Two times a day (BID) | ORAL | 0 refills | Status: DC | PRN
  Filled 2023-01-30: qty 15, 8d supply, fill #0

## 2023-01-30 MED ORDER — IBUPROFEN 600 MG PO TABS
600.0000 mg | ORAL_TABLET | Freq: Three times a day (TID) | ORAL | 0 refills | Status: DC | PRN
  Filled 2023-01-30: qty 21, 7d supply, fill #0

## 2023-01-30 MED ORDER — IBUPROFEN 400 MG PO TABS
600.0000 mg | ORAL_TABLET | Freq: Once | ORAL | Status: AC
  Administered 2023-01-30: 600 mg via ORAL
  Filled 2023-01-30: qty 1

## 2023-01-30 MED ORDER — OXYCODONE-ACETAMINOPHEN 5-325 MG PO TABS
1.0000 | ORAL_TABLET | Freq: Once | ORAL | Status: AC
  Administered 2023-01-30: 1 via ORAL
  Filled 2023-01-30: qty 1

## 2023-01-30 NOTE — Progress Notes (Signed)
Patient attempted to be outreached by Buddy Duty, PharmD Candidate on 01/30/23 to discuss hypertension.   Kinaya Hilliker, Student-PharmD
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
## 2023-01-31 ENCOUNTER — Telehealth: Payer: Self-pay

## 2023-01-31 ENCOUNTER — Other Ambulatory Visit: Payer: Self-pay

## 2023-01-31 MED ORDER — INSULIN GLARGINE SOLOSTAR 100 UNIT/ML ~~LOC~~ SOPN
25.0000 [IU] | PEN_INJECTOR | Freq: Every day | SUBCUTANEOUS | 1 refills | Status: DC
  Filled 2023-01-31: qty 3, 12d supply, fill #0
  Filled 2023-04-28: qty 3, 12d supply, fill #1

## 2023-01-31 MED ORDER — INSULIN LISPRO (1 UNIT DIAL) 100 UNIT/ML (KWIKPEN)
10.0000 [IU] | PEN_INJECTOR | Freq: Three times a day (TID) | SUBCUTANEOUS | 1 refills | Status: DC
  Filled 2023-01-31: qty 3, 10d supply, fill #0
  Filled 2023-04-28: qty 3, 10d supply, fill #1
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
## 2023-02-01 ENCOUNTER — Other Ambulatory Visit: Payer: Self-pay

## 2023-02-01 ENCOUNTER — Ambulatory Visit: Payer: Self-pay | Admitting: Nurse Practitioner

## 2023-02-06 ENCOUNTER — Other Ambulatory Visit: Payer: Self-pay
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
## 2023-02-07 ENCOUNTER — Other Ambulatory Visit: Payer: Self-pay

## 2023-02-07 ENCOUNTER — Ambulatory Visit (INDEPENDENT_AMBULATORY_CARE_PROVIDER_SITE_OTHER): Payer: No Typology Code available for payment source | Admitting: Nurse Practitioner

## 2023-02-07 ENCOUNTER — Encounter: Payer: Self-pay | Admitting: Nurse Practitioner

## 2023-02-07 VITALS — BP 142/87 | HR 88 | Temp 97.7°F | Wt 288.8 lb

## 2023-02-07 DIAGNOSIS — M25511 Pain in right shoulder: Secondary | ICD-10-CM | POA: Insufficient documentation

## 2023-02-07 DIAGNOSIS — M25512 Pain in left shoulder: Secondary | ICD-10-CM | POA: Diagnosis not present

## 2023-02-07 DIAGNOSIS — F419 Anxiety disorder, unspecified: Secondary | ICD-10-CM | POA: Diagnosis not present

## 2023-02-07 MED ORDER — HYDROXYZINE HCL 10 MG PO TABS
10.0000 mg | ORAL_TABLET | Freq: Three times a day (TID) | ORAL | 0 refills | Status: DC | PRN
Start: 2023-02-07 — End: 2023-04-28
  Filled 2023-02-07: qty 30, 10d supply, fill #0

## 2023-02-07 MED ORDER — PREDNISONE 20 MG PO TABS
20.0000 mg | ORAL_TABLET | Freq: Every day | ORAL | 0 refills | Status: AC
  Filled 2023-02-07: qty 5, 5d supply, fill #0

## 2023-02-07 MED ORDER — KETOROLAC TROMETHAMINE 30 MG/ML IJ SOLN
30.0000 mg | Freq: Once | INTRAMUSCULAR | Status: AC
Start: 2023-02-07 — End: 2023-02-07
  Administered 2023-02-07: 30 mg via INTRAMUSCULAR

## 2023-02-07 NOTE — Patient Instructions (Addendum)
1. Acute pain of both shoulders  - predniSONE (DELTASONE) 20 MG tablet; Take 1 tablet (20 mg total) by mouth daily with breakfast for 5 days.  Dispense: 5 tablet; Refill: 0   2. Anxiety  - hydrOXYzine (ATARAX) 10 MG tablet; Take 1 tablet (10 mg total) by mouth 3 (three) times daily as needed for anxiety.  Dispense: 30 tablet; Refill: 0   Follow up:  Follow up as needed
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
## 2023-02-07 NOTE — Assessment & Plan Note (Signed)
-   predniSONE (DELTASONE) 20 MG tablet; Take 1 tablet (20 mg total) by mouth daily with breakfast for 5 days.  Dispense: 5 tablet; Refill: 0   2. Anxiety  - hydrOXYzine (ATARAX) 10 MG tablet; Take 1 tablet (10 mg total) by mouth 3 (three) times daily as needed for anxiety.  Dispense: 30 tablet; Refill: 0   Follow up:  Follow up as needed

## 2023-02-08 ENCOUNTER — Other Ambulatory Visit: Payer: Self-pay

## 2023-02-22 ENCOUNTER — Ambulatory Visit (HOSPITAL_COMMUNITY): Payer: No Typology Code available for payment source

## 2023-02-22 ENCOUNTER — Other Ambulatory Visit: Payer: Self-pay

## 2023-02-22 MED ORDER — TORSEMIDE 20 MG PO TABS
40.0000 mg | ORAL_TABLET | Freq: Every day | ORAL | 3 refills | Status: DC
  Filled 2023-02-22: qty 180, 90d supply, fill #0
  Filled 2023-04-28: qty 180, 90d supply, fill #1

## 2023-02-23 ENCOUNTER — Other Ambulatory Visit: Payer: Self-pay | Admitting: Nephrology

## 2023-02-23 DIAGNOSIS — N181 Chronic kidney disease, stage 1: Secondary | ICD-10-CM

## 2023-03-02 ENCOUNTER — Other Ambulatory Visit: Payer: Self-pay

## 2023-03-06 ENCOUNTER — Other Ambulatory Visit: Payer: Self-pay

## 2023-03-06 ENCOUNTER — Emergency Department (HOSPITAL_BASED_OUTPATIENT_CLINIC_OR_DEPARTMENT_OTHER)
Admission: EM | Admit: 2023-03-06 | Discharge: 2023-03-06 | Disposition: A | Discharge status: Home / Self Care | Payer: No Typology Code available for payment source | Attending: Emergency Medicine | Admitting: Emergency Medicine

## 2023-03-06 ENCOUNTER — Encounter (HOSPITAL_BASED_OUTPATIENT_CLINIC_OR_DEPARTMENT_OTHER): Payer: Self-pay

## 2023-03-06 DIAGNOSIS — Z794 Long term (current) use of insulin: Secondary | ICD-10-CM | POA: Diagnosis not present

## 2023-03-06 DIAGNOSIS — I10 Essential (primary) hypertension: Secondary | ICD-10-CM | POA: Insufficient documentation

## 2023-03-06 DIAGNOSIS — Z7982 Long term (current) use of aspirin: Secondary | ICD-10-CM | POA: Diagnosis not present

## 2023-03-06 DIAGNOSIS — Z7984 Long term (current) use of oral hypoglycemic drugs: Secondary | ICD-10-CM | POA: Insufficient documentation

## 2023-03-06 DIAGNOSIS — L02411 Cutaneous abscess of right axilla: Secondary | ICD-10-CM | POA: Diagnosis present

## 2023-03-06 DIAGNOSIS — E119 Type 2 diabetes mellitus without complications: Secondary | ICD-10-CM | POA: Insufficient documentation

## 2023-03-06 DIAGNOSIS — Z79899 Other long term (current) drug therapy: Secondary | ICD-10-CM | POA: Insufficient documentation

## 2023-03-06 MED ORDER — OXYCODONE-ACETAMINOPHEN 5-325 MG PO TABS: 1.0000 | ORAL_TABLET | Freq: Four times a day (QID) | ORAL | 0 refills | Status: DC | PRN

## 2023-03-06 MED ORDER — DOXYCYCLINE HYCLATE 100 MG PO TABS
100.0000 mg | ORAL_TABLET | Freq: Once | ORAL | Status: AC
  Administered 2023-03-06: 100 mg via ORAL
  Filled 2023-03-06: qty 1

## 2023-03-06 MED ORDER — CLINDAMYCIN HCL 150 MG PO CAPS: 300.0000 mg | ORAL_CAPSULE | Freq: Once | ORAL | Status: DC

## 2023-03-06 MED ORDER — LIDOCAINE HCL (PF) 1 % IJ SOLN
INTRAMUSCULAR | Status: AC
  Filled 2023-03-06: qty 5

## 2023-03-06 MED ORDER — OXYCODONE-ACETAMINOPHEN 5-325 MG PO TABS
2.0000 | ORAL_TABLET | Freq: Once | ORAL | Status: AC
  Administered 2023-03-06: 2 via ORAL
  Filled 2023-03-06: qty 2

## 2023-03-06 MED ORDER — DOXYCYCLINE HYCLATE 100 MG PO CAPS: 100.0000 mg | ORAL_CAPSULE | Freq: Two times a day (BID) | ORAL | 0 refills | Status: DC

## 2023-03-06 NOTE — ED Provider Notes (Signed)
[place] EMERGENCY DEPARTMENT AT [facility] Provider Note   CSN: [id number] Arrival date & time: 03/06/23  7829     History  Chief Complaint  Patient presents with   Abscess    Carolyn Zimmerman is a 37 y.o. female.  Patient is a 37 year old female with history of hidradenitis suppurativa, type 2 diabetes, hypertension.  Patient presenting today with complaints of abscess to the right axilla.  She also has a chronic, draining abscess to her sternum.  She denies any fevers or chills.  Pain is worse with palpitation.  No alleviating factors.  The history is provided by the patient.       Home Medications Prior to Admission medications   Medication Sig Start Date End Date Taking? Authorizing Provider  acetaminophen (TYLENOL) 500 MG tablet Take 2 tablets (1,000 mg total) by mouth every 8 (eight) hours as needed for mild pain. 06/02/20   Kallie Locks, FNP  albuterol (VENTOLIN HFA) 108 (90 Base) MCG/ACT inhaler Inhale 2 puffs into the lungs every 4 (four) hours as needed for wheezing or shortness of breath. 06/02/20   Kallie Locks, FNP  amLODipine (NORVASC) 10 MG tablet Take 1 tablet (10 mg total) by mouth daily. 10/26/22   Ivonne Andrew, NP  aspirin 81 MG chewable tablet Chew 1 tablet (81 mg total) by mouth daily. 10/26/22   Ivonne Andrew, NP  blood glucose meter kit and supplies Dispense based on patient and insurance preference. Use up to four times daily as directed. (FOR ICD-9 250.00, 250.01). 06/02/20   Kallie Locks, FNP  carvedilol (COREG) 25 MG tablet Take 1 tablet (25 mg total) by mouth 2 (two) times daily. 10/26/22   Ivonne Andrew, NP  cyclobenzaprine (FLEXERIL) 10 MG tablet Take 1 tablet (10 mg total) by mouth 2 (two) times daily as needed for up to 15 doses for muscle spasms. 01/30/23   Terald Sleeper, MD  furosemide (LASIX) 20 MG tablet Take 2 tablets (40 mg total) by mouth in the morning AND 1 tablet (20 mg total) Nightly. 01/22/23   Tobb, Kardie, DO   hydrochlorothiazide (MICROZIDE) 12.5 MG capsule Take 1 capsule (12.5 mg total) by mouth daily. 12/18/22   Tobb, Kardie, DO  hydrOXYzine (ATARAX) 10 MG tablet Take 1 tablet (10 mg total) by mouth 3 (three) times daily as needed for anxiety. 02/07/23   Ivonne Andrew, NP  ibuprofen (ADVIL) 600 MG tablet Take 1 tablet (600 mg total) by mouth every 8 (eight) hours as needed for up to 21 doses. 01/30/23   Terald Sleeper, MD  Insulin Glargine Solostar (LANTUS) 100 UNIT/ML Solostar Pen Inject 25 Units into the skin daily. 01/31/23   Ivonne Andrew, NP  insulin lispro (HUMALOG) 100 UNIT/ML KwikPen Inject 10 Units into the skin 3 (three) times daily. 01/31/23   Ivonne Andrew, NP  metFORMIN (GLUCOPHAGE-XR) 500 MG 24 hr tablet Take 2 tablets (1,000 mg total) by mouth in the morning and at bedtime. 10/26/22   Ivonne Andrew, NP  nicotine (NICODERM CQ - DOSED IN MG/24 HOURS) 14 mg/24hr patch APPLY 1 PATCH(14 MG) TOPICALLY TO THE SKIN DAILY Patient not taking: Reported on 01/23/2023 08/25/22   Tobb, Lavona [place]ound, DO  nicotine polacrilex (NICORETTE) 4 MG gum Take 1 each (4 mg total) by mouth as needed for smoking cessation. Use park and chew method as instructed. Patient not taking: Reported on 01/23/2023 05/26/22   Tobb, Kardie, DO  rosuvastatin (CRESTOR) 20 MG tablet Take  1 tablet (20 mg total) by mouth daily. 10/26/22   Ivonne Andrew, NP  spironolactone (ALDACTONE) 50 MG tablet Take 1 tablet (50 mg total) by mouth daily. 11/14/22   Flossie Dibble, NP  torsemide (DEMADEX) 20 MG tablet Take 2 tablets (40 mg total) by mouth daily. 02/22/23     valsartan (DIOVAN) 320 MG tablet Take 1 tablet (320 mg total) by mouth daily. 10/26/22   Ivonne Andrew, NP      Allergies    Contrast media [iodinated contrast media]    Review of Systems   Review of Systems  All other systems reviewed and are negative.   Physical Exam Updated Vital Signs BP (!) 207/107   Pulse 86   Temp 98.4 F (36.9 C)   Resp 18   Ht 5\' 8"  (1.727  m)   Wt 131 kg   SpO2 100%   BMI 43.91 kg/m  Physical Exam Vitals and nursing note reviewed.  Constitutional:      Appearance: Normal appearance.  HENT:     Head: Normocephalic.  Pulmonary:     Effort: Pulmonary effort is normal.  Skin:    General: Skin is warm and dry.     Comments: Under the right axilla, there is a large 5 cm x 7 cm fluctuant, tender, erythematous area.  There is also an erythematous, tender area noted to the sternum between her breasts.  This is draining purulent material.  There is no fluctuance.  Neurological:     Mental Status: She is alert and oriented to person, place, and time.     ED Results / Procedures / Treatments   Labs (all labs ordered are listed, but only abnormal results are displayed) Labs Reviewed - No data to display  EKG None  Radiology No results found.  Procedures Procedures   INCISION AND DRAINAGE Performed by: Geoffery Lyons Consent: Verbal consent obtained. Risks and benefits: risks, benefits and alternatives were discussed Type: abscess  Body area: right axilla  Anesthesia: local infiltration  Incision was made with a scalpel.  Local anesthetic: lidocaine 1% without epinephrine  Anesthetic total: 4 ml  Complexity: complex Blunt dissection to break up loculations  Drainage: purulent  Drainage amount: large  Packing material: no packing placed  Patient tolerance: Patient tolerated the procedure well with no immediate complications.    Medications Ordered in ED Medications  lidocaine (PF) (XYLOCAINE) 1 % injection (has no administration in time range)  oxyCODONE-acetaminophen (PERCOCET/ROXICET) 5-325 MG per tablet 2 tablet (has no administration in time range)  doxycycline (VIBRA-TABS) tablet 100 mg (has no administration in time range)    ED Course/ Medical Decision Making/ A&P  The large abscess under the right axilla was incised and drained as above.  Patient tolerated the procedure well.  She was  given doxycycline and Percocet afterward.  The chronic wound noted to her sternal area is draining purulent material and I do not feel requires incision and drainage.  She will be discharged with doxycycline, pain medication, warm compresses, and follow-up as needed.  Final Clinical Impression(s) / ED Diagnoses Final diagnoses:  None    Rx / DC Orders ED Discharge Orders     None         Geoffery Lyons, MD 03/06/23 [phone]

## 2023-03-06 NOTE — Discharge Instructions (Signed)
Begin taking doxycycline as prescribed.  Take ibuprofen 600 mg every 6 hours as needed for pain.  Begin taking Percocet as prescribed as needed for pain not relieved with ibuprofen.  Apply warm compresses as frequently as possible for the next several days.  Follow-up with primary doctor if not improving in the next few days.

## 2023-03-06 NOTE — ED Triage Notes (Signed)
POV from home, A&O x4, amb to room, GCS 15  Pt sts that she's been dealing with sternal abscess for a couple days that keeps reopening, c/o right axilla abscess, hx of hidradenitis

## 2023-03-08 ENCOUNTER — Telehealth: Payer: Self-pay

## 2023-03-08 NOTE — Transitions of Care (Post Inpatient/ED Visit) (Cosign Needed)
03/08/2023  Name: Carolyn Zimmerman MRN: [id number] DOB: Nov 18, 1985  Today's TOC FU Call Status: Today's TOC FU Call Status:: Successful TOC FU Call Competed TOC FU Call Complete Date: 03/08/23  Transition Care Management Follow-up Telephone Call Date of Discharge: 03/06/23 Discharge Facility: [facility] ([facility]) Type of Discharge: Emergency Department Reason for ED Visit: Other: (Boil) How have you been since you were released from the hospital?: Better Any questions or concerns?: No  Items Reviewed: Did you receive and understand the discharge instructions provided?: Yes Medications obtained,verified, and reconciled?: Yes (Medications Reviewed) Any new allergies since your discharge?: No Dietary orders reviewed?: NA Do you have support at home?: Yes People in Home: other relative(s)  Medications Reviewed Today: Medications Reviewed Today     Reviewed by Renelda Loma, RMA (Registered Medical Assistant) on 03/08/23 at 1301  Med List Status: <None>   Medication Order Taking? Sig Documenting Provider Last Dose Status Informant  acetaminophen (TYLENOL) 500 MG tablet [id number] Yes Take 2 tablets (1,000 mg total) by mouth every 8 (eight) hours as needed for mild pain. Kallie Locks, FNP Taking Active Self           Med Note Renelda Loma   Thu Nov 02, 2022  9:32 AM) Prn   albuterol (VENTOLIN HFA) 108 [phone] Base) MCG/ACT inhaler [id number] Yes Inhale 2 puffs into the lungs every 4 (four) hours as needed for wheezing or shortness of breath. Kallie Locks, FNP Taking Active Self  amLODipine (NORVASC) 10 MG tablet [id number] Yes Take 1 tablet (10 mg total) by mouth daily. Ivonne Andrew, NP Taking Active   aspirin 81 MG chewable tablet [id number] Yes Chew 1 tablet (81 mg total) by mouth daily. Ivonne Andrew, NP Taking Active   blood glucose meter kit and supplies [id number] Yes Dispense based on patient and insurance preference. Use up to four times daily as directed. (FOR  ICD-9 250.00, 250.01). Kallie Locks, FNP Taking Active Self  carvedilol (COREG) 25 MG tablet [id number] Yes Take 1 tablet (25 mg total) by mouth 2 (two) times daily. Ivonne Andrew, NP Taking Active   cloNIDine (CATAPRES) tablet 0.2 mg [id number]   Thomasene Ripple, DO  Active   cyclobenzaprine (FLEXERIL) 10 MG tablet [id number] Yes Take 1 tablet (10 mg total) by mouth 2 (two) times daily as needed for up to 15 doses for muscle spasms. Terald Sleeper, MD Taking Active   doxycycline (VIBRAMYCIN) 100 MG capsule [id number] Yes Take 1 capsule (100 mg total) by mouth 2 (two) times daily. One po bid x 7 days Geoffery Lyons, MD Taking Active   furosemide (LASIX) 20 MG tablet [id number] Yes Take 2 tablets (40 mg total) by mouth in the morning AND 1 tablet (20 mg total) Nightly. Tobb, Kardie, DO Taking Active   hydrochlorothiazide (MICROZIDE) 12.5 MG capsule [id number] Yes Take 1 capsule (12.5 mg total) by mouth daily. Tobb, Kardie, DO Taking Active   hydrOXYzine (ATARAX) 10 MG tablet [id number] Yes Take 1 tablet (10 mg total) by mouth 3 (three) times daily as needed for anxiety. Ivonne Andrew, NP Taking Active   ibuprofen (ADVIL) 600 MG tablet [id number] Yes Take 1 tablet (600 mg total) by mouth every 8 (eight) hours as needed for up to 21 doses. Terald Sleeper, MD Taking Active   Insulin Glargine Solostar (LANTUS) 100 UNIT/ML Solostar Pen [id number] Yes Inject 25 Units into the skin daily. Ivonne Andrew, NP Taking Active   insulin lispro (HUMALOG) 100 UNIT/ML KwikPen [id number] Yes  Inject 10 Units into the skin 3 (three) times daily. Ivonne Andrew, NP Taking Active   metFORMIN (GLUCOPHAGE-XR) 500 MG 24 hr tablet [id number] Yes Take 2 tablets (1,000 mg total) by mouth in the morning and at bedtime. Ivonne Andrew, NP Taking Active   nicotine (NICODERM CQ - DOSED IN MG/24 HOURS) 14 mg/24hr patch [id number]  APPLY 1 PATCH(14 MG) TOPICALLY TO THE SKIN DAILY  Patient not taking: Reported on 01/23/2023   Tobb,  Kardie, DO  Active   nicotine polacrilex (NICORETTE) 4 MG gum [id number]  Take 1 each (4 mg total) by mouth as needed for smoking cessation. Use park and chew method as instructed.  Patient not taking: Reported on 01/23/2023   Thomasene Ripple, DO  Active   oxyCODONE-acetaminophen (PERCOCET) 5-325 MG tablet [id number] Yes Take 1-2 tablets by mouth every 6 (six) hours as needed. Geoffery Lyons, MD Taking Active   rosuvastatin (CRESTOR) 20 MG tablet [id number] Yes Take 1 tablet (20 mg total) by mouth daily. Ivonne Andrew, NP Taking Active   spironolactone (ALDACTONE) 50 MG tablet [id number] Yes Take 1 tablet (50 mg total) by mouth daily. Flossie Dibble, NP Taking Active   torsemide (DEMADEX) 20 MG tablet [id number] Yes Take 2 tablets (40 mg total) by mouth daily.  Taking Active   valsartan (DIOVAN) 320 MG tablet [id number] Yes Take 1 tablet (320 mg total) by mouth daily. Ivonne Andrew, NP Taking Active             Home Care and Equipment/Supplies: Any new equipment or medical supplies ordered?: NA  Functional Questionnaire: Do you need assistance with bathing/showering or dressing?: No Do you need assistance with meal preparation?: No Do you need assistance with eating?: No Do you have difficulty maintaining continence: No Do you need assistance with getting out of bed/getting out of a chair/moving?: No Do you have difficulty managing or taking your medications?: No  Follow up appointments reviewed: PCP Follow-up appointment confirmed?: Yes Date of PCP follow-up appointment?: 03/29/23 Follow-up Provider: [facility] Follow-up appointment confirmed?: NA Do you need transportation to your follow-up appointment?: No Do you understand care options if your condition(s) worsen?: Yes-patient verbalized understanding    SIGNATURE  Renelda Loma RMA

## 2023-03-09 ENCOUNTER — Ambulatory Visit (HOSPITAL_COMMUNITY): Payer: No Typology Code available for payment source | Attending: Cardiology

## 2023-03-09 DIAGNOSIS — R0602 Shortness of breath: Secondary | ICD-10-CM | POA: Diagnosis not present

## 2023-03-09 LAB — ECHOCARDIOGRAM COMPLETE
Area-P 1/2: 3.91 cm2
S' Lateral: 4.7 cm

## 2023-03-15 ENCOUNTER — Ambulatory Visit
Admission: RE | Admit: 2023-03-15 | Discharge: 2023-03-15 | Disposition: A | Discharge status: Home / Self Care | Payer: No Typology Code available for payment source | Source: Ambulatory Visit | Attending: Nephrology | Admitting: Nephrology

## 2023-03-15 DIAGNOSIS — N181 Chronic kidney disease, stage 1: Secondary | ICD-10-CM

## 2023-03-23 ENCOUNTER — Other Ambulatory Visit: Payer: Self-pay

## 2023-03-23 DIAGNOSIS — Z794 Long term (current) use of insulin: Secondary | ICD-10-CM

## 2023-03-26 ENCOUNTER — Other Ambulatory Visit (INDEPENDENT_AMBULATORY_CARE_PROVIDER_SITE_OTHER): Payer: No Typology Code available for payment source

## 2023-03-26 DIAGNOSIS — Z794 Long term (current) use of insulin: Secondary | ICD-10-CM | POA: Diagnosis not present

## 2023-03-26 DIAGNOSIS — E1165 Type 2 diabetes mellitus with hyperglycemia: Secondary | ICD-10-CM | POA: Diagnosis not present

## 2023-03-26 LAB — LIPID PANEL
Cholesterol: 151 mg/dL (ref 0–200)
HDL: 32.1 mg/dL — ABNORMAL LOW (ref >39.00)
NonHDL: 118.65
Total CHOL/HDL Ratio: 5
Triglycerides: 357 mg/dL — ABNORMAL HIGH (ref 0.0–149.0)
VLDL: 71.4 mg/dL — ABNORMAL HIGH (ref 0.0–40.0)

## 2023-03-26 LAB — COMPREHENSIVE METABOLIC PANEL
ALT: 10 U/L (ref 0–35)
AST: 9 U/L (ref 0–37)
Albumin: 3.2 g/dL — ABNORMAL LOW (ref 3.5–5.2)
Alkaline Phosphatase: 99 U/L (ref 39–117)
BUN: 8 mg/dL (ref 6–23)
CO2: 25 mEq/L (ref 19–32)
Calcium: 8.9 mg/dL (ref 8.4–10.5)
Chloride: 103 mEq/L (ref 96–112)
Creatinine, Ser: 0.68 mg/dL (ref 0.40–1.20)
GFR: 113.31 mL/min (ref >60.00)
Glucose, Bld: 350 mg/dL — ABNORMAL HIGH (ref 70–99)
Potassium: 4.2 mEq/L (ref 3.5–5.1)
Sodium: 136 mEq/L (ref 135–145)
Total Bilirubin: 0.3 mg/dL (ref 0.2–1.2)
Total Protein: 6.7 g/dL (ref 6.0–8.3)

## 2023-03-26 LAB — MICROALBUMIN / CREATININE URINE RATIO
Creatinine,U: 41.9 mg/dL
Microalb Creat Ratio: 373.2 mg/g — ABNORMAL HIGH (ref 0.0–30.0)
Microalb, Ur: 156.4 mg/dL — ABNORMAL HIGH (ref 0.0–1.9)

## 2023-03-26 LAB — LDL CHOLESTEROL, DIRECT: Direct LDL: 64 mg/dL

## 2023-03-26 LAB — HEMOGLOBIN A1C: Hgb A1c MFr Bld: 12 % — ABNORMAL HIGH (ref 4.6–6.5)

## 2023-03-29 ENCOUNTER — Telehealth: Payer: Self-pay

## 2023-03-29 ENCOUNTER — Other Ambulatory Visit: Payer: Self-pay

## 2023-03-29 ENCOUNTER — Encounter: Payer: Self-pay | Admitting: Nurse Practitioner

## 2023-03-29 ENCOUNTER — Encounter: Payer: Self-pay | Admitting: "Endocrinology

## 2023-03-29 ENCOUNTER — Ambulatory Visit (INDEPENDENT_AMBULATORY_CARE_PROVIDER_SITE_OTHER): Payer: No Typology Code available for payment source | Admitting: "Endocrinology

## 2023-03-29 ENCOUNTER — Ambulatory Visit (INDEPENDENT_AMBULATORY_CARE_PROVIDER_SITE_OTHER): Payer: No Typology Code available for payment source | Admitting: Nurse Practitioner

## 2023-03-29 VITALS — BP 170/115 | HR 92 | Ht 68.0 in | Wt 283.0 lb

## 2023-03-29 VITALS — BP 183/100 | HR 73 | Temp 97.4°F | Ht 67.5 in | Wt 281.0 lb

## 2023-03-29 DIAGNOSIS — Z794 Long term (current) use of insulin: Secondary | ICD-10-CM

## 2023-03-29 DIAGNOSIS — E782 Mixed hyperlipidemia: Secondary | ICD-10-CM

## 2023-03-29 DIAGNOSIS — Z7984 Long term (current) use of oral hypoglycemic drugs: Secondary | ICD-10-CM

## 2023-03-29 DIAGNOSIS — E1165 Type 2 diabetes mellitus with hyperglycemia: Secondary | ICD-10-CM

## 2023-03-29 DIAGNOSIS — I10 Essential (primary) hypertension: Secondary | ICD-10-CM

## 2023-03-29 DIAGNOSIS — L732 Hidradenitis suppurativa: Secondary | ICD-10-CM | POA: Diagnosis not present

## 2023-03-29 MED ORDER — AMOXICILLIN-POT CLAVULANATE 875-125 MG PO TABS
1.0000 | ORAL_TABLET | Freq: Two times a day (BID) | ORAL | 0 refills | Status: DC
Start: 2023-03-29 — End: 2023-10-13
  Filled 2023-03-29: qty 20, 10d supply, fill #0

## 2023-03-29 MED ORDER — DEXCOM G7 SENSOR MISC
1.0000 | 0 refills | Status: DC
  Filled 2023-03-29: qty 9, 84d supply, fill #0

## 2023-03-29 MED ORDER — NICOTINE 14 MG/24HR TD PT24
14.0000 mg | MEDICATED_PATCH | Freq: Every day | TRANSDERMAL | 2 refills | Status: DC
  Filled 2023-03-29: qty 28, 28d supply, fill #0
  Filled 2023-04-28: qty 28, 28d supply, fill #1

## 2023-03-29 MED ORDER — CLONIDINE HCL 0.1 MG PO TABS
0.1000 mg | ORAL_TABLET | Freq: Once | ORAL | Status: AC
  Administered 2023-03-29: 0.1 mg via ORAL

## 2023-03-29 MED ORDER — CLINDAMYCIN PHOSPHATE 1 % EX GEL
Freq: Two times a day (BID) | CUTANEOUS | 0 refills | Status: DC
Start: 2023-03-29 — End: 2023-10-21
  Filled 2023-03-29: qty 30, 30d supply, fill #0

## 2023-03-29 NOTE — Telephone Encounter (Signed)
Done. GH 

## 2023-03-29 NOTE — Assessment & Plan Note (Signed)
-   clindamycin (CLINDAGEL) 1 % gel; Apply topically 2 (two) times daily.  Dispense: 30 g; Refill: 0 - amoxicillin-clavulanate (AUGMENTIN) 875-125 MG tablet; Take 1 tablet by mouth 2 (two) times daily.  Dispense: 20 tablet; Refill: 0 - Ambulatory referral to Dermatology   Follow up:  Follow up in 6 months

## 2023-03-29 NOTE — Patient Instructions (Signed)

## 2023-03-29 NOTE — Patient Instructions (Addendum)
1. Hidradenitis suppurativa of axillae,chest wall, groins  - clindamycin (CLINDAGEL) 1 % gel; Apply topically 2 (two) times daily.  Dispense: 30 g; Refill: 0 - amoxicillin-clavulanate (AUGMENTIN) 875-125 MG tablet; Take 1 tablet by mouth 2 (two) times daily.  Dispense: 20 tablet; Refill: 0 - Ambulatory referral to Dermatology   Follow up:  Follow up in 6 months
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
## 2023-03-29 NOTE — Progress Notes (Signed)
@Patient  ID: Carolyn Zimmerman, female    DOB: 01/08/1986, 37 y.o.   MRN: [id number]  Chief Complaint  Patient presents with   Follow-up    Hospital follow up.    Referring provider: Ivonne Andrew, NP   HPI  Patient presents today for an ED follow-up.  She was seen on 03/06/2023 for abscess to right axilla.  An I&D was performed and patient was started on doxycycline.  She did take medication as prescribed and states that this area did heal up.  She states that she does have multiple abscesses between her breast.  The largest abscess is draining.  We will order clindamycin gel and also a round of Augmentin oral.  We will refer patient to dermatology.  Patient is requesting a refill of nicotine patches. Denies f/c/s, n/v/d, hemoptysis, PND, leg swelling Denies chest pain or edema       Allergies  Allergen Reactions   Contrast Media [Iodinated Contrast Media] Hives and Swelling    Immunization History  Administered Date(s) Administered   Influenza,inj,Quad PF,6+ Mos 06/16/2017   Pneumococcal Polysaccharide-23 06/17/2017    Past Medical History:  Diagnosis Date   Abscess of chest wall 06/15/2017   Coronavirus infection 05/02/2020   CVA (cerebral vascular accident) (HCC) 07/14/2022   L MCA CVA, watershed infarct   Diabetes mellitus    Heart failure with mildly reduced ejection fraction (HFmrEF) (HCC) 09/29/2021   EF 45%, moderate LVH   Hyperlipidemia associated with type 2 diabetes mellitus (HCC)    Hypertension    Jaundice    Obesity    TOA (tubo-ovarian abscess) 09/2019    Tobacco History: Social History   Tobacco Use  Smoking Status Former   Current packs/day: 0.50   Types: Cigarettes  Smokeless Tobacco Never   Counseling given: Not Answered   Outpatient Encounter Medications as of 03/29/2023  Medication Sig   acetaminophen (TYLENOL) 500 MG tablet Take 2 tablets (1,000 mg total) by mouth every 8 (eight) hours as needed for mild pain.   albuterol (VENTOLIN  HFA) 108 (90 Base) MCG/ACT inhaler Inhale 2 puffs into the lungs every 4 (four) hours as needed for wheezing or shortness of breath.   amLODipine (NORVASC) 10 MG tablet Take 1 tablet (10 mg total) by mouth daily.   amoxicillin-clavulanate (AUGMENTIN) 875-125 MG tablet Take 1 tablet by mouth 2 (two) times daily.   aspirin 81 MG chewable tablet Chew 1 tablet (81 mg total) by mouth daily.   carvedilol (COREG) 25 MG tablet Take 1 tablet (25 mg total) by mouth 2 (two) times daily.   clindamycin (CLINDAGEL) 1 % gel Apply topically 2 (two) times daily.   Continuous Glucose Sensor (DEXCOM G7 SENSOR) MISC use to monitor blood glucose   furosemide (LASIX) 20 MG tablet Take 2 tablets (40 mg total) by mouth in the morning AND 1 tablet (20 mg total) Nightly.   hydrochlorothiazide (MICROZIDE) 12.5 MG capsule Take 1 capsule (12.5 mg total) by mouth daily.   hydrOXYzine (ATARAX) 10 MG tablet Take 1 tablet (10 mg total) by mouth 3 (three) times daily as needed for anxiety.   ibuprofen (ADVIL) 600 MG tablet Take 1 tablet (600 mg total) by mouth every 8 (eight) hours as needed for up to 21 doses.   Insulin Glargine Solostar (LANTUS) 100 UNIT/ML Solostar Pen Inject 25 Units into the skin daily.   insulin lispro (HUMALOG) 100 UNIT/ML KwikPen Inject 10 Units into the skin 3 (three) times daily.   metFORMIN (GLUCOPHAGE-XR) 500 MG  24 hr tablet Take 2 tablets (1,000 mg total) by mouth in the morning and at bedtime.   rosuvastatin (CRESTOR) 20 MG tablet Take 1 tablet (20 mg total) by mouth daily.   spironolactone (ALDACTONE) 50 MG tablet Take 1 tablet (50 mg total) by mouth daily.   torsemide (DEMADEX) 20 MG tablet Take 2 tablets (40 mg total) by mouth daily.   valsartan (DIOVAN) 320 MG tablet Take 1 tablet (320 mg total) by mouth daily.   [DISCONTINUED] nicotine (NICODERM CQ - DOSED IN MG/24 HOURS) 14 mg/24hr patch APPLY 1 PATCH(14 MG) TOPICALLY TO THE SKIN DAILY   blood glucose meter kit and supplies Dispense based on  patient and insurance preference. Use up to four times daily as directed. (FOR ICD-9 250.00, 250.01). (Patient not taking: Reported on 03/29/2023)   cyclobenzaprine (FLEXERIL) 10 MG tablet Take 1 tablet (10 mg total) by mouth 2 (two) times daily as needed for up to 15 doses for muscle spasms. (Patient not taking: Reported on 03/29/2023)   doxycycline (VIBRAMYCIN) 100 MG capsule Take 1 capsule (100 mg total) by mouth 2 (two) times daily. One po bid x 7 days (Patient not taking: Reported on 03/29/2023)   nicotine (NICODERM CQ - DOSED IN MG/24 HOURS) 14 mg/24hr patch Place 1 patch (14 mg total) onto the skin daily.   nicotine polacrilex (NICORETTE) 4 MG gum Take 1 each (4 mg total) by mouth as needed for smoking cessation. Use park and chew method as instructed. (Patient not taking: Reported on 01/23/2023)   oxyCODONE-acetaminophen (PERCOCET) 5-325 MG tablet Take 1-2 tablets by mouth every 6 (six) hours as needed. (Patient not taking: Reported on 03/29/2023)   Facility-Administered Encounter Medications as of 03/29/2023  Medication   [COMPLETED] cloNIDine (CATAPRES) tablet 0.1 mg   cloNIDine (CATAPRES) tablet 0.2 mg     Review of Systems  Review of Systems  Constitutional: Negative.   HENT: Negative.    Cardiovascular: Negative.   Gastrointestinal: Negative.   Skin:        Abscesses to breasts  Allergic/Immunologic: Negative.   Neurological: Negative.   Psychiatric/Behavioral: Negative.         Physical Exam  BP (!) 183/100   Pulse 73   Temp (!) 97.4 F (36.3 C)   Ht 5' 7.5" (1.715 m)   Wt 281 lb (127.5 kg)   LMP 03/29/2023   SpO2 100%   BMI 43.36 kg/m   Wt Readings from Last 5 Encounters:  03/29/23 281 lb (127.5 kg)  03/29/23 283 lb (128.4 kg)  03/06/23 288 lb 12.8 oz (131 kg)  02/07/23 288 lb 12.8 oz (131 kg)  01/30/23 271 lb (122.9 kg)     Physical Exam Vitals and nursing note reviewed.  Constitutional:      General: She is not in acute distress.    Appearance: She is  well-developed.  Cardiovascular:     Rate and Rhythm: Normal rate and regular rhythm.  Pulmonary:     Effort: Pulmonary effort is normal.     Breath sounds: Normal breath sounds.  Chest:       Comments: Multiple abscesses to region between breasts and under folds.  Neurological:     Mental Status: She is alert and oriented to person, place, and time.      Lab Results:  CBC    Component Value Date/Time   WBC 11.5 (H) 01/22/2023 1836   RBC 4.22 01/22/2023 1836   HGB 11.2 (L) 01/22/2023 1836   HGB 11.9 11/02/2022 1007  HCT 34.4 (L) 01/22/2023 1836   HCT 37.8 11/02/2022 1007   PLT 337 01/22/2023 1836   PLT 339 11/02/2022 1007   MCV 81.5 01/22/2023 1836   MCV 83 11/02/2022 1007   MCH 26.5 01/22/2023 1836   MCHC 32.6 01/22/2023 1836   RDW 13.6 01/22/2023 1836   RDW 13.7 11/02/2022 1007   LYMPHSABS 3.7 01/22/2023 1836   MONOABS 0.8 01/22/2023 1836   EOSABS 0.1 01/22/2023 1836   BASOSABS 0.0 01/22/2023 1836    BMET    Component Value Date/Time   NA 136 03/26/2023 1009   NA 135 01/22/2023 0931   K 4.2 03/26/2023 1009   CL 103 03/26/2023 1009   CO2 25 03/26/2023 1009   GLUCOSE 350 (H) 03/26/2023 1009   BUN 8 03/26/2023 1009   BUN 9 01/22/2023 0931   CREATININE 0.68 03/26/2023 1009   CALCIUM 8.9 03/26/2023 1009   GFRNONAA >60 01/22/2023 1836   GFRAA >60 05/12/2020 0337    BNP    Component Value Date/Time   BNP 112.6 (H) 01/22/2023 1900    ProBNP No results found for: "PROBNP"  Imaging: US RENAL  Result Date: 03/15/2023 CLINICAL DATA:  Chronic renal disease EXAM: RENAL / URINARY TRACT ULTRASOUND COMPLETE COMPARISON:  None Available. FINDINGS: Right Kidney: Renal measurements: 14.4 x 6 x 6.2 cm = volume: 279 mL. Echogenicity within normal limits. No mass or hydronephrosis visualized. Left Kidney: Renal measurements: 14.1 x 6.4 x 5.7 cm = volume: 268 mL. Echogenicity within normal limits. No mass or hydronephrosis visualized. Bladder: No obvious abnormalities.   Poor distention. Other: None. IMPRESSION: 1. The kidneys are normal in appearance. 2. The bladder is poorly distended but no obvious abnormalities are identified. Electronically Signed   By: Gerome Sam III M.D.   On: 03/15/2023 15:22   ECHOCARDIOGRAM COMPLETE  Result Date: 03/09/2023    ECHOCARDIOGRAM REPORT   Patient Name:   LUANE ROCHON  Date of Exam: 03/09/2023 Medical Rec #:  [id number]     Height:       68.0 in Accession #:    [id number]    Weight:       288.8 lb Date of Birth:  [birth year]     BSA:          2.389 m Patient Age:    36 years      BP:           142/87 mmHg Patient Gender: F             HR:           77 bpm. Exam Location:  [address] Procedure: 2D Echo, Cardiac Doppler and Color Doppler Indications:    R06.02 SOB  History:        Patient has prior history of Echocardiogram examinations, most                 recent 09/29/2021. Signs/Symptoms:Shortness of Breath; Risk                 Factors:Hypertension, Diabetes, Dyslipidemia, Obesity and                 Current Smoker.  Sonographer:    Samule Ohm RDCS Referring Phys: [id number] KARDIE TOBB  Sonographer Comments: Technically difficult study due to poor echo windows and patient is obese. Image acquisition challenging due to patient body habitus. IMPRESSIONS  1. Left ventricular ejection fraction, by estimation, is 40 to 45%. The left ventricle has mildly decreased function. The left  ventricle demonstrates global hypokinesis. The left ventricular internal cavity size was moderately dilated. There is moderate  eccentric left ventricular hypertrophy. Left ventricular diastolic parameters are consistent with Grade II diastolic dysfunction (pseudonormalization).  2. Right ventricular systolic function is normal. The right ventricular size is normal. Tricuspid regurgitation signal is inadequate for assessing PA pressure.  3. Left atrial size was severely dilated.  4. The mitral valve is normal in structure. No evidence of mitral valve  regurgitation.  5. The aortic valve is tricuspid. Aortic valve regurgitation is not visualized. No aortic stenosis is present.  6. The inferior vena cava is normal in size with greater than 50% respiratory variability, suggesting right atrial pressure of 3 mmHg. Comparison(s): The left ventricular function is unchanged. The pericardial effusion is smaller. FINDINGS  Left Ventricle: Left ventricular ejection fraction, by estimation, is 40 to 45%. The left ventricle has mildly decreased function. The left ventricle demonstrates global hypokinesis. The left ventricular internal cavity size was moderately dilated. There is moderate eccentric left ventricular hypertrophy. Left ventricular diastolic parameters are consistent with Grade II diastolic dysfunction (pseudonormalization). Right Ventricle: The right ventricular size is normal. No increase in right ventricular wall thickness. Right ventricular systolic function is normal. Tricuspid regurgitation signal is inadequate for assessing PA pressure. Left Atrium: Left atrial size was severely dilated. Right Atrium: Right atrial size was normal in size. Pericardium: Trivial pericardial effusion is present. Mitral Valve: The mitral valve is normal in structure. No evidence of mitral valve regurgitation. Tricuspid Valve: The tricuspid valve is normal in structure. Tricuspid valve regurgitation is not demonstrated. Aortic Valve: The aortic valve is tricuspid. Aortic valve regurgitation is not visualized. No aortic stenosis is present. Pulmonic Valve: The pulmonic valve was grossly normal. Pulmonic valve regurgitation is not visualized. Aorta: The aortic root and ascending aorta are structurally normal, with no evidence of dilitation. Venous: The inferior vena cava is normal in size with greater than 50% respiratory variability, suggesting right atrial pressure of 3 mmHg. IAS/Shunts: There is right bowing of the interatrial septum, suggestive of elevated left atrial pressure.  No atrial level shunt detected by color flow Doppler.  LEFT VENTRICLE PLAX 2D LVIDd:         6.10 cm   Diastology LVIDs:         4.70 cm   LV e' medial:    5.00 cm/s LV PW:         1.50 cm   LV E/e' medial:  19.5 LV IVS:        1.70 cm   LV e' lateral:   9.68 cm/s LVOT diam:     2.00 cm   LV E/e' lateral: 10.1 LV SV:         71 LV SV Index:   30 LVOT Area:     3.14 cm  RIGHT VENTRICLE             IVC RV S prime:     20.20 cm/s  IVC diam: 1.50 cm TAPSE (M-mode): 2.8 cm LEFT ATRIUM             Index        RIGHT ATRIUM           Index LA diam:        5.80 cm 2.43 cm/m   RA Pressure: 3.00 mmHg LA Vol (A2C):   90.6 ml 37.92 ml/m  RA Area:     12.70 cm LA Vol (A4C):   83.1 ml 34.78 ml/m  RA  Volume:   33.40 ml  13.98 ml/m LA Biplane Vol: 87.3 ml 36.54 ml/m  AORTIC VALVE LVOT Vmax:   111.00 cm/s LVOT Vmean:  77.300 cm/s LVOT VTI:    0.226 m  AORTA Ao Root diam: 3.10 cm Ao Asc diam:  3.50 cm MITRAL VALVE                TRICUSPID VALVE MV Area (PHT): 3.91 cm     Estimated RAP:  3.00 mmHg MV Decel Time: 194 msec MV E velocity: 97.50 cm/s   SHUNTS MV A velocity: 105.00 cm/s  Systemic VTI:  0.23 m MV E/A ratio:  0.93         Systemic Diam: 2.00 cm Rachelle Hora Croitoru MD Electronically signed by Thurmon Fair MD Signature Date/Time: 03/09/2023/2:16:24 PM    Final      Assessment & Plan:   Hidradenitis suppurativa of axillae,chest wall, groins - clindamycin (CLINDAGEL) 1 % gel; Apply topically 2 (two) times daily.  Dispense: 30 g; Refill: 0 - amoxicillin-clavulanate (AUGMENTIN) 875-125 MG tablet; Take 1 tablet by mouth 2 (two) times daily.  Dispense: 20 tablet; Refill: 0 - Ambulatory referral to Dermatology   Follow up:  Follow up in 6 months     Ivonne Andrew, NP 03/29/2023

## 2023-03-30 ENCOUNTER — Other Ambulatory Visit: Payer: Self-pay

## 2023-04-05 ENCOUNTER — Other Ambulatory Visit: Payer: Self-pay

## 2023-04-28 ENCOUNTER — Other Ambulatory Visit: Payer: Self-pay | Admitting: Nurse Practitioner

## 2023-04-28 DIAGNOSIS — F419 Anxiety disorder, unspecified: Secondary | ICD-10-CM

## 2023-04-30 ENCOUNTER — Inpatient Hospital Stay: Payer: Self-pay | Admitting: Nurse Practitioner

## 2023-04-30 ENCOUNTER — Telehealth: Payer: Self-pay

## 2023-04-30 ENCOUNTER — Other Ambulatory Visit: Payer: Self-pay

## 2023-04-30 MED ORDER — HYDROXYZINE HCL 10 MG PO TABS
10.0000 mg | ORAL_TABLET | Freq: Three times a day (TID) | ORAL | 0 refills | Status: DC | PRN
Start: 2023-04-30 — End: 2023-10-21
  Filled 2023-04-30: qty 30, 10d supply, fill #0

## 2023-04-30 NOTE — Transitions of Care (Post Inpatient/ED Visit) (Cosign Needed)
04/30/2023  Name: Carolyn Zimmerman MRN: [id number] DOB: [birth year]  Today's TOC FU Call Status: Today's TOC FU Call Status:: Successful TOC FU Call Completed TOC FU Call Complete Date: 04/30/23  Transition Care Management Follow-up Telephone Call Date of Discharge: 04/29/23 Discharge Facility: Other ([facility]) Name of Other ([facility]) Discharge Facility: [facility] Type of Discharge: Emergency Department Reason for ED Visit: Respiratory Respiratory Diagnosis:  (poss covid) How have you been since you were released from the hospital?: Better Any questions or concerns?: No  Items Reviewed: Medications obtained,verified, and reconciled?: Yes (Medications Reviewed) Any new allergies since your discharge?: Yes Dietary orders reviewed?: NA Do you have support at home?: Yes People in Home: other relative(s)  Medications Reviewed Today: Medications Reviewed Today     Reviewed by Renelda Loma, RMA (Registered Medical Assistant) on 04/30/23 at 1335  Med List Status: <None>   Medication Order Taking? Sig Documenting Provider Last Dose Status Informant  acetaminophen (TYLENOL) 500 MG tablet [id number] Yes Take 2 tablets (1,000 mg total) by mouth every 8 (eight) hours as needed for mild pain. Kallie Locks, FNP Taking Active Self           Med Note Renelda Loma   Thu Nov 02, 2022  9:32 AM) Prn   albuterol (VENTOLIN HFA) 108 [phone] Base) MCG/ACT inhaler [id number] Yes Inhale 2 puffs into the lungs every 4 (four) hours as needed for wheezing or shortness of breath. Kallie Locks, FNP Taking Active Self  amLODipine (NORVASC) 10 MG tablet [id number] Yes Take 1 tablet (10 mg total) by mouth daily. Ivonne Andrew, NP Taking Active   amoxicillin-clavulanate (AUGMENTIN) 875-125 MG tablet [id number] No Take 1 tablet by mouth 2 (two) times daily.  Patient not taking: Reported on 04/30/2023   Ivonne Andrew, NP Not Taking Active   aspirin 81 MG chewable tablet [id number] Yes Chew 1  tablet (81 mg total) by mouth daily. Ivonne Andrew, NP Taking Active   blood glucose meter kit and supplies [id number] No Dispense based on patient and insurance preference. Use up to four times daily as directed. (FOR ICD-9 250.00, 250.01).  Patient not taking: Reported on 03/29/2023   Kallie Locks, FNP Not Taking Active Self  carvedilol (COREG) 25 MG tablet [id number] Yes Take 1 tablet (25 mg total) by mouth 2 (two) times daily. Ivonne Andrew, NP Taking Active   clindamycin (CLINDAGEL) 1 % gel [id number] Yes Apply topically 2 (two) times daily. Ivonne Andrew, NP Taking Active   cloNIDine (CATAPRES) tablet 0.2 mg [id number]   Thomasene Ripple, DO  Active   Continuous Glucose Sensor (DEXCOM G7 SENSOR) MISC [id number] Yes use to monitor blood glucose Altamese Olivet, MD Taking Active   cyclobenzaprine (FLEXERIL) 10 MG tablet [id number] No Take 1 tablet (10 mg total) by mouth 2 (two) times daily as needed for up to 15 doses for muscle spasms.  Patient not taking: Reported on 03/29/2023   Terald Sleeper, MD Not Taking Active   doxycycline (VIBRAMYCIN) 100 MG capsule [id number] No Take 1 capsule (100 mg total) by mouth 2 (two) times daily. One po bid x 7 days  Patient not taking: Reported on 03/29/2023   Geoffery Lyons, MD Not Taking Active   furosemide (LASIX) 20 MG tablet [id number] Yes Take 2 tablets (40 mg total) by mouth in the morning AND 1 tablet (20 mg total) Nightly. Tobb, Lavona Mound, DO Taking Active   hydrochlorothiazide (MICROZIDE) 12.5 MG capsule [id number] Yes Take 1 capsule (12.5 mg  total) by mouth daily. Tobb, Kardie, DO Taking Active   hydrOXYzine (ATARAX) 10 MG tablet [id number] No Take 1 tablet (10 mg total) by mouth 3 (three) times daily as needed for anxiety.  Patient not taking: Reported on 04/30/2023   Ivonne Andrew, NP Not Taking Active   ibuprofen (ADVIL) 600 MG tablet [id number] Yes Take 1 tablet (600 mg total) by mouth every 8 (eight) hours as needed for up to 21 doses. Terald Sleeper, MD Taking Active   Insulin Glargine Solostar (LANTUS) 100 UNIT/ML Solostar Pen [id number] Yes Inject 25 Units into the skin daily. Ivonne Andrew, NP Taking Active   insulin lispro (HUMALOG) 100 UNIT/ML KwikPen [id number] Yes Inject 10 Units into the skin 3 (three) times daily. Ivonne Andrew, NP Taking Active   metFORMIN (GLUCOPHAGE-XR) 500 MG 24 hr tablet [id number] Yes Take 2 tablets (1,000 mg total) by mouth in the morning and at bedtime. Ivonne Andrew, NP Taking Active   nicotine (NICODERM CQ - DOSED IN MG/24 HOURS) 14 mg/24hr patch [id number] Yes Place 1 patch (14 mg total) onto the skin daily. Ivonne Andrew, NP Taking Active   nicotine polacrilex (NICORETTE) 4 MG gum [id number] No Take 1 each (4 mg total) by mouth as needed for smoking cessation. Use park and chew method as instructed.  Patient not taking: Reported on 01/23/2023   Thomasene Ripple, DO Not Taking Active   oxyCODONE-acetaminophen (PERCOCET) 5-325 MG tablet [id number] No Take 1-2 tablets by mouth every 6 (six) hours as needed.  Patient not taking: Reported on 03/29/2023   Geoffery Lyons, MD Not Taking Active   rosuvastatin (CRESTOR) 20 MG tablet [id number] Yes Take 1 tablet (20 mg total) by mouth daily. Ivonne Andrew, NP Taking Active   spironolactone (ALDACTONE) 50 MG tablet [id number] Yes Take 1 tablet (50 mg total) by mouth daily. Flossie Dibble, NP Taking Active   torsemide (DEMADEX) 20 MG tablet [id number] Yes Take 2 tablets (40 mg total) by mouth daily.  Taking Active   valsartan (DIOVAN) 320 MG tablet [id number] Yes Take 1 tablet (320 mg total) by mouth daily. Ivonne Andrew, NP Taking Active             Home Care and Equipment/Supplies: Were Home Health Services Ordered?: NA Any new equipment or medical supplies ordered?: NA  Functional Questionnaire: Do you need assistance with bathing/showering or dressing?: No Do you need assistance with meal preparation?: No Do you need assistance with eating?:  No Do you have difficulty maintaining continence: No Do you need assistance with getting out of bed/getting out of a chair/moving?: No Do you have difficulty managing or taking your medications?: No  Follow up appointments reviewed: Date of PCP follow-up appointment?: 05/10/23 Follow-up Provider: Angus Seller Specialist [facility] Follow-up appointment confirmed?: NA Do you need transportation to your follow-up appointment?: No Do you understand care options if your condition(s) worsen?: Yes-patient verbalized understanding    SIGNATURE Renelda Loma RMA

## 2023-05-02 ENCOUNTER — Other Ambulatory Visit: Payer: Self-pay

## 2023-05-07 ENCOUNTER — Other Ambulatory Visit: Payer: Self-pay

## 2023-05-10 ENCOUNTER — Inpatient Hospital Stay: Payer: Self-pay | Admitting: Nurse Practitioner

## 2023-05-11 ENCOUNTER — Other Ambulatory Visit: Payer: Self-pay

## 2023-05-14 ENCOUNTER — Telehealth: Payer: Self-pay | Admitting: Emergency Medicine

## 2023-05-14 ENCOUNTER — Ambulatory Visit: Payer: No Typology Code available for payment source | Attending: Cardiology | Admitting: Cardiology

## 2023-05-14 VITALS — BP 172/108 | HR 84 | Ht 67.5 in | Wt 281.6 lb

## 2023-05-14 DIAGNOSIS — I119 Hypertensive heart disease without heart failure: Secondary | ICD-10-CM

## 2023-05-14 DIAGNOSIS — Z8673 Personal history of transient ischemic attack (TIA), and cerebral infarction without residual deficits: Secondary | ICD-10-CM | POA: Diagnosis not present

## 2023-05-14 DIAGNOSIS — I10 Essential (primary) hypertension: Secondary | ICD-10-CM

## 2023-05-14 DIAGNOSIS — E782 Mixed hyperlipidemia: Secondary | ICD-10-CM

## 2023-05-14 DIAGNOSIS — E108 Type 1 diabetes mellitus with unspecified complications: Secondary | ICD-10-CM

## 2023-05-14 DIAGNOSIS — Z79899 Other long term (current) drug therapy: Secondary | ICD-10-CM

## 2023-05-14 LAB — COMPREHENSIVE METABOLIC PANEL
ALT: 9 IU/L (ref 0–32)
AST: 10 IU/L (ref 0–40)
Albumin: 3.4 g/dL — ABNORMAL LOW (ref 3.9–4.9)
Alkaline Phosphatase: 112 IU/L (ref 44–121)
BUN/Creatinine Ratio: 18 (ref 9–23)
BUN: 12 mg/dL (ref 6–20)
Bilirubin Total: 0.3 mg/dL (ref 0.0–1.2)
CO2: 24 mmol/L (ref 20–29)
Calcium: 9.4 mg/dL (ref 8.7–10.2)
Chloride: 100 mmol/L (ref 96–106)
Creatinine, Ser: 0.66 mg/dL (ref 0.57–1.00)
Globulin, Total: 3.2 g/dL (ref 1.5–4.5)
Glucose: 308 mg/dL — ABNORMAL HIGH (ref 70–99)
Potassium: 4.3 mmol/L (ref 3.5–5.2)
Sodium: 134 mmol/L (ref 134–144)
Total Protein: 6.6 g/dL (ref 6.0–8.5)
eGFR: 118 mL/min/{1.73_m2} (ref >59)

## 2023-05-14 LAB — MAGNESIUM: Magnesium: 1.6 mg/dL (ref 1.6–2.3)

## 2023-05-14 NOTE — Telephone Encounter (Signed)
Left message  Pt needs a 6/7 week f/u appt with Dr Servando Salina after todays visit. There is a visit scheduled for 9/9- not needed that soon- I will cancel that appt- needs to make 6-7 week appt instead.
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
## 2023-05-14 NOTE — Patient Instructions (Signed)
Medication Instructions:  No changes *If you need a refill on your cardiac medications before your next appointment, please call your pharmacy*  Lab Work: CMP, Mag- today If you have labs (blood work) drawn today and your tests are completely normal, you will receive your results only by: MyChart Message (if you have MyChart) OR A paper copy in the mail If you have any lab test that is abnormal or we need to change your treatment, we will call you to review the results.  Follow-Up: At [facility], you and your health needs are our priority.  As part of our continuing mission to provide you with exceptional heart care, we have created designated Provider Care Teams.  These Care Teams include your primary Cardiologist (physician) and Advanced Practice Providers (APPs -  Physician Assistants and Nurse Practitioners) who all work together to provide you with the care you need, when you need it.  We recommend signing up for the patient portal called "MyChart".  Sign up information is provided on this After Visit Summary.  MyChart is used to connect with patients for Virtual Visits (Telemedicine).  Patients are able to view lab/test results, encounter notes, upcoming appointments, etc.  Non-urgent messages can be sent to your provider as well.   To learn more about what you can do with MyChart, go to [email or web address].    Your next appointment:    2 weeks with Pharm D- for Hypertension  6 weeks with Dr Servando Salina

## 2023-05-17 ENCOUNTER — Ambulatory Visit (INDEPENDENT_AMBULATORY_CARE_PROVIDER_SITE_OTHER): Payer: No Typology Code available for payment source | Admitting: Nurse Practitioner

## 2023-05-17 ENCOUNTER — Encounter: Payer: Self-pay | Admitting: Nurse Practitioner

## 2023-05-17 VITALS — BP 153/85 | HR 83 | Resp 16 | Wt 277.0 lb

## 2023-05-17 DIAGNOSIS — M79622 Pain in left upper arm: Secondary | ICD-10-CM | POA: Diagnosis not present

## 2023-05-17 NOTE — Patient Instructions (Signed)
1. Left upper arm pain  - VAS [place] UPPER EXTREMITY VENOUS DUPLEX; Future  Follow up:  Follow up as needed

## 2023-05-17 NOTE — Progress Notes (Signed)
@Patient  ID: Carolyn Zimmerman, female    DOB: Mar 01, 1986, 37 y.o.   MRN: [id number]  Chief Complaint  Patient presents with   Hospitalization Follow-up    Referring provider: Ivonne Andrew, NP   HPI  Patient presents today for follow-up.  Since her last visit here she has followed with cardiology.  She does have an upcoming appointment scheduled with dermatology for hidradenitis suppurative in November.  She does complain today that her left arm is swollen.  She states that the last time she was in the hospital they did have an IV there.  She states that she is also having pain in the left upper arm.  Will order ultrasound. Denies f/c/s, n/v/d, hemoptysis, PND, leg swelling Denies chest pain or edema     Allergies  Allergen Reactions   Contrast Media [Iodinated Contrast Media] Hives and Swelling    Immunization History  Administered Date(s) Administered   Influenza,inj,Quad PF,6+ Mos 06/16/2017   Pneumococcal Polysaccharide-23 06/17/2017    Past Medical History:  Diagnosis Date   Abscess of chest wall 06/15/2017   Coronavirus infection 05/02/2020   CVA (cerebral vascular accident) (HCC) 07/14/2022   L MCA CVA, watershed infarct   Diabetes mellitus    Heart failure with mildly reduced ejection fraction (HFmrEF) (HCC) 09/29/2021   EF 45%, moderate LVH   Hyperlipidemia associated with type 2 diabetes mellitus (HCC)    Hypertension    Jaundice    Obesity    TOA (tubo-ovarian abscess) 09/2019    Tobacco History: Social History   Tobacco Use  Smoking Status Former   Current packs/day: 0.50   Types: Cigarettes  Smokeless Tobacco Never   Counseling given: Not Answered   Outpatient Encounter Medications as of 05/17/2023  Medication Sig   acetaminophen (TYLENOL) 500 MG tablet Take 2 tablets (1,000 mg total) by mouth every 8 (eight) hours as needed for mild pain.   albuterol (VENTOLIN HFA) 108 (90 Base) MCG/ACT inhaler Inhale 2 puffs into the lungs every 4 (four)  hours as needed for wheezing or shortness of breath.   amLODipine (NORVASC) 10 MG tablet Take 1 tablet (10 mg total) by mouth daily.   amoxicillin-clavulanate (AUGMENTIN) 875-125 MG tablet Take 1 tablet by mouth 2 (two) times daily.   aspirin 81 MG chewable tablet Chew 1 tablet (81 mg total) by mouth daily.   blood glucose meter kit and supplies Dispense based on patient and insurance preference. Use up to four times daily as directed. (FOR ICD-9 250.00, 250.01).   carvedilol (COREG) 25 MG tablet Take 1 tablet (25 mg total) by mouth 2 (two) times daily.   clindamycin (CLINDAGEL) 1 % gel Apply topically 2 (two) times daily. (Patient taking differently: Apply topically as needed.)   Continuous Glucose Sensor (DEXCOM G7 SENSOR) MISC use to monitor blood glucose   cyclobenzaprine (FLEXERIL) 10 MG tablet Take 1 tablet (10 mg total) by mouth 2 (two) times daily as needed for up to 15 doses for muscle spasms.   doxycycline (VIBRAMYCIN) 100 MG capsule Take 1 capsule (100 mg total) by mouth 2 (two) times daily. One po bid x 7 days   furosemide (LASIX) 20 MG tablet Take 2 tablets (40 mg total) by mouth in the morning AND 1 tablet (20 mg total) Nightly. (Patient taking differently: Take 2 tablets (40 mg total) by mouth in the morning)   hydrochlorothiazide (MICROZIDE) 12.5 MG capsule Take 1 capsule (12.5 mg total) by mouth daily.   hydrOXYzine (ATARAX) 10 MG tablet Take  1 tablet (10 mg total) by mouth 3 (three) times daily as needed for anxiety.   ibuprofen (ADVIL) 600 MG tablet Take 1 tablet (600 mg total) by mouth every 8 (eight) hours as needed for up to 21 doses.   Insulin Glargine Solostar (LANTUS) 100 UNIT/ML Solostar Pen Inject 25 Units into the skin daily.   insulin lispro (HUMALOG) 100 UNIT/ML KwikPen Inject 10 Units into the skin 3 (three) times daily.   metFORMIN (GLUCOPHAGE-XR) 500 MG 24 hr tablet Take 2 tablets (1,000 mg total) by mouth in the morning and at bedtime.   nicotine (NICODERM CQ -  DOSED IN MG/24 HOURS) 14 mg/24hr patch Place 1 patch (14 mg total) onto the skin daily.   nicotine polacrilex (NICORETTE) 4 MG gum Take 1 each (4 mg total) by mouth as needed for smoking cessation. Use park and chew method as instructed. (Patient not taking: Reported on 01/23/2023)   oxyCODONE-acetaminophen (PERCOCET) 5-325 MG tablet Take 1-2 tablets by mouth every 6 (six) hours as needed. (Patient not taking: Reported on 03/29/2023)   rosuvastatin (CRESTOR) 20 MG tablet Take 1 tablet (20 mg total) by mouth daily.   spironolactone (ALDACTONE) 50 MG tablet Take 1 tablet (50 mg total) by mouth daily.   torsemide (DEMADEX) 20 MG tablet Take 2 tablets (40 mg total) by mouth daily.   valsartan (DIOVAN) 320 MG tablet Take 1 tablet (320 mg total) by mouth daily.   Facility-Administered Encounter Medications as of 05/17/2023  Medication   cloNIDine (CATAPRES) tablet 0.2 mg     Review of Systems  Review of Systems  Constitutional: Negative.   HENT: Negative.    Cardiovascular: Negative.   Gastrointestinal: Negative.   Musculoskeletal:        Pain to left upper arm  Allergic/Immunologic: Negative.   Neurological: Negative.   Psychiatric/Behavioral: Negative.         Physical Exam  BP (!) 153/85   Pulse 83   Resp 16   Wt 277 lb (125.6 kg)   SpO2 100%   BMI 42.74 kg/m   Wt Readings from Last 5 Encounters:  05/17/23 277 lb (125.6 kg)  05/14/23 281 lb 9.6 oz (127.7 kg)  03/29/23 281 lb (127.5 kg)  03/29/23 283 lb (128.4 kg)  03/06/23 288 lb 12.8 oz (131 kg)     Physical Exam Vitals and nursing note reviewed.  Constitutional:      General: She is not in acute distress.    Appearance: She is well-developed.  Cardiovascular:     Rate and Rhythm: Normal rate and regular rhythm.  Pulmonary:     Effort: Pulmonary effort is normal.     Breath sounds: Normal breath sounds.  Musculoskeletal:     Right upper arm: Swelling and tenderness present.       Arms:  Neurological:      Mental Status: She is alert and oriented to person, place, and time.      Lab Results:  CBC    Component Value Date/Time   WBC 11.5 (H) 01/22/2023 1836   RBC 4.22 01/22/2023 1836   HGB 11.2 (L) 01/22/2023 1836   HGB 11.9 11/02/2022 1007   HCT 34.4 (L) 01/22/2023 1836   HCT 37.8 11/02/2022 1007   PLT 337 01/22/2023 1836   PLT 339 11/02/2022 1007   MCV 81.5 01/22/2023 1836   MCV 83 11/02/2022 1007   MCH 26.5 01/22/2023 1836   MCHC 32.6 01/22/2023 1836   RDW 13.6 01/22/2023 1836   RDW 13.7  11/02/2022 1007   LYMPHSABS 3.7 01/22/2023 1836   MONOABS 0.8 01/22/2023 1836   EOSABS 0.1 01/22/2023 1836   BASOSABS 0.0 01/22/2023 1836    BMET    Component Value Date/Time   NA 134 05/14/2023 1016   K 4.3 05/14/2023 1016   CL 100 05/14/2023 1016   CO2 24 05/14/2023 1016   GLUCOSE 308 (H) 05/14/2023 1016   GLUCOSE 350 (H) 03/26/2023 1009   BUN 12 05/14/2023 1016   CREATININE 0.66 05/14/2023 1016   CALCIUM 9.4 05/14/2023 1016   GFRNONAA >60 01/22/2023 1836   GFRAA >60 05/12/2020 0337    BNP    Component Value Date/Time   BNP 112.6 (H) 01/22/2023 1900     Assessment & Plan:   Left upper arm pain - VAS [place] UPPER EXTREMITY VENOUS DUPLEX; Future  Follow up:  Follow up as needed     Ivonne Andrew, NP 05/17/2023

## 2023-05-17 NOTE — Progress Notes (Signed)
Pt presents for hospital f/u -pt doing fine states really did not need this f/u

## 2023-05-17 NOTE — Assessment & Plan Note (Signed)
-   VAS [place] UPPER EXTREMITY VENOUS DUPLEX; Future  Follow up:  Follow up as needed

## 2023-05-22 ENCOUNTER — Other Ambulatory Visit: Payer: Self-pay
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
## 2023-05-25 ENCOUNTER — Other Ambulatory Visit: Payer: Self-pay

## 2023-05-25 NOTE — Progress Notes (Signed)
Patient appearing on report for [facility] - Hypertension Control report due to last documented ambulatory blood pressure of 153/85 on 05/17/2023. Next appointment with PCP is 08/27/2023.She does have an appointment with CPP on 05/31/2023 to address HTN.    Outreached patient to discuss hypertension control and medication management.   Current antihypertensives:  Amlodipine 10 mg  Carvedilol 25 mg bid Hydrochlorothiazide 12.5 MG  Valsartan 320 mg Spironolactone 50 mg   She recently restarted her BP medications after not picking them up due to cost. She states that her prescriptions came to ~$90 total.  Patient has an automated upper arm home BP machine. She is not monitoring her BP at home at the moment.    Assessment/Plan: - Currently uncontrolled - - Reviewed goal blood pressure <140/80 - Reviewed appropriate administration of medication regimen - Reviewed to check blood pressure a few times per week, at least 2 hours after taking BP medications, document, and provide at next provider visit -Encouraged patient to reach out to PCP's office to get connected to resources to help medication affordability  Sofie Rower, PharmD, [employer] PGY1

## 2023-05-28 ENCOUNTER — Encounter (HOSPITAL_COMMUNITY): Payer: Self-pay

## 2023-05-28 ENCOUNTER — Ambulatory Visit (HOSPITAL_COMMUNITY): Admission: RE | Admit: 2023-05-28 | Payer: No Typology Code available for payment source | Source: Ambulatory Visit

## 2023-05-28 ENCOUNTER — Ambulatory Visit: Payer: No Typology Code available for payment source | Admitting: Cardiology

## 2023-05-31 ENCOUNTER — Ambulatory Visit: Payer: No Typology Code available for payment source | Attending: Internal Medicine | Admitting: Pharmacist

## 2023-05-31 ENCOUNTER — Encounter: Payer: Self-pay | Admitting: Pharmacist

## 2023-05-31 ENCOUNTER — Ambulatory Visit (INDEPENDENT_AMBULATORY_CARE_PROVIDER_SITE_OTHER): Payer: No Typology Code available for payment source | Admitting: Neurology

## 2023-05-31 ENCOUNTER — Encounter: Payer: Self-pay | Admitting: Neurology

## 2023-05-31 ENCOUNTER — Other Ambulatory Visit (INDEPENDENT_AMBULATORY_CARE_PROVIDER_SITE_OTHER): Payer: No Typology Code available for payment source

## 2023-05-31 VITALS — BP 184/104

## 2023-05-31 VITALS — BP 172/113 | HR 70 | Wt 277.8 lb

## 2023-05-31 DIAGNOSIS — E1165 Type 2 diabetes mellitus with hyperglycemia: Secondary | ICD-10-CM

## 2023-05-31 DIAGNOSIS — F17200 Nicotine dependence, unspecified, uncomplicated: Secondary | ICD-10-CM

## 2023-05-31 DIAGNOSIS — R531 Weakness: Secondary | ICD-10-CM | POA: Diagnosis not present

## 2023-05-31 DIAGNOSIS — I63232 Cerebral infarction due to unspecified occlusion or stenosis of left carotid arteries: Secondary | ICD-10-CM | POA: Diagnosis not present

## 2023-05-31 DIAGNOSIS — E782 Mixed hyperlipidemia: Secondary | ICD-10-CM

## 2023-05-31 DIAGNOSIS — I10 Essential (primary) hypertension: Secondary | ICD-10-CM | POA: Diagnosis not present

## 2023-05-31 DIAGNOSIS — Z794 Long term (current) use of insulin: Secondary | ICD-10-CM

## 2023-05-31 DIAGNOSIS — I6522 Occlusion and stenosis of left carotid artery: Secondary | ICD-10-CM

## 2023-05-31 DIAGNOSIS — R5383 Other fatigue: Secondary | ICD-10-CM

## 2023-05-31 DIAGNOSIS — R200 Anesthesia of skin: Secondary | ICD-10-CM

## 2023-05-31 DIAGNOSIS — I6350 Cerebral infarction due to unspecified occlusion or stenosis of unspecified cerebral artery: Secondary | ICD-10-CM

## 2023-05-31 DIAGNOSIS — E66813 Obesity, class 3: Secondary | ICD-10-CM

## 2023-05-31 LAB — VITAMIN D 25 HYDROXY (VIT D DEFICIENCY, FRACTURES): VITD: 15.38 ng/mL — ABNORMAL LOW (ref 30.00–100.00)

## 2023-05-31 MED ORDER — HYDROCHLOROTHIAZIDE 12.5 MG PO CAPS
25.0000 mg | ORAL_CAPSULE | Freq: Every day | ORAL | Status: DC
Start: 2023-05-31 — End: 2023-10-29

## 2023-05-31 NOTE — Patient Instructions (Addendum)
It was nice seeing you again  Please continue your:  Amlodipine 10mg  daily Carvedilol 25mg  BID Furosemide 40mg  in evening, 20mg  in morning Valsartan 320mg  daily Spironolactone 50mg  daily Torsemide  We will increase your hydrochlorothiazide to 25mg  once a day. You can take 2 capsules every morning  Move your insulin to the morning to help you remember to take it  I will see you back in a few weeks.  Bring your sensor with you if its still causing problems  Laural Golden, PharmD, BCACP, CDCES, CPP [address], [address] [place], [place], [address] Phone: [phone], Fax: [phone]

## 2023-05-31 NOTE — Patient Instructions (Signed)
I will check your vitamin D level today to see if this is behind your increased fatigue. I will be in touch when I have your results.  Continue aspirin 81 mg daily.  Continue Crestor 20 mg daily for cholesterol.  Continue to work on quitting smoking.  Speak with your primary care doctor as you need better blood pressure and diabetes control to prevent future strokes.  I will see you back in clinic in 6 months or sooner if needed.  If you have new difficulty speaking, face droop, numbness on one side of the body, weakness on one side of the body, or dizziness/imbalance, this could be the sign of a stroke. Don't wait, please call EMS and be evaluated at the nearest emergency room.   The physicians and staff at [facility] are committed to providing excellent care. You may receive a survey requesting feedback about your experience at our office. We strive to receive "very good" responses to the survey questions. If you feel that your experience would prevent you from giving the office a "very good " response, please contact our office to try to remedy the situation. We may be reached at [phone]. Thank you for taking the time out of your busy day to complete the survey.  Jacquelyne Balint, MD [facility]

## 2023-05-31 NOTE — Progress Notes (Signed)
Patient ID: Carolyn Zimmerman                 DOB: 03/29/1986                      MRN: [id number]     HPI: Carolyn Zimmerman is a 37 y.o. female referred by Dr. Servando Salina to HTN clinic. PMH is significant for uncontrolled HTN, uncontrolled DM, CHF, CVA, obesity, smoking, and medication noncompliance.   Patient presents today to discuss BP management. Had neuro appointment this morning. Reports BP at home has been "high,: usually in 170s although she does not check very often. Takes all antihypertensives in the morning and did take them today. Often forgets nighttime meds (of note, this is when she is supposed to be taking basal insulin).  Has previously been noncompliant which she admits to. Med dispense history shows she has picked up all her medications on 8/23 and has been taking since then without any reported adverse effects.  Patient says recently she has been eating baking powder and baking soda.  Has not been checking blood sugar at home regurlaly because "it is always high." Was prescribed a Dexcom CGM but is not wearing. Took it off last time because the alarms were beeping non stop. Phone shows alarm is set to ring when BG >250. Has endo appointment in late October.  Continues to work at [employer] where is where she eats most meals. Chicken, cole slaw, green beans, macaroni and cheese, and potatoes. Drinks water or diet coke.   Current HTN meds:  Amlodipine 10mg  daily Carvedilol 25mg  BID Furosemide 40mg  in evening, 20mg  in morning Hydrochlorothiazide 12.5mg  daily Valsartan 320mg  daily Spironolactone 50mg  daily Torsemide 40mg  daily  BP goal: <130/80    Wt Readings from Last 3 Encounters:  05/31/23 277 lb 12.8 oz (126 kg)  05/17/23 277 lb (125.6 kg)  05/14/23 281 lb 9.6 oz (127.7 kg)   BP Readings from Last 3 Encounters:  05/31/23 (!) 172/113  05/31/23 (!) 184/104  05/17/23 (!) 153/85   Pulse Readings from Last 3 Encounters:  05/31/23 70  05/17/23 83  05/14/23 84    Renal  function: Estimated Creatinine Clearance: 137.8 mL/min (by C-G formula based on SCr of 0.66 mg/dL).  Past Medical History:  Diagnosis Date   Abscess of chest wall 06/15/2017   Coronavirus infection 05/02/2020   CVA (cerebral vascular accident) (HCC) 07/14/2022   L MCA CVA, watershed infarct   Diabetes mellitus    Heart failure with mildly reduced ejection fraction (HFmrEF) (HCC) 09/29/2021   EF 45%, moderate LVH   Hyperlipidemia associated with type 2 diabetes mellitus (HCC)    Hypertension    Jaundice    Obesity    TOA (tubo-ovarian abscess) 09/2019    Current Outpatient Medications on File Prior to Visit  Medication Sig Dispense Refill   acetaminophen (TYLENOL) 500 MG tablet Take 2 tablets (1,000 mg total) by mouth every 8 (eight) hours as needed for mild pain. 60 tablet 3   albuterol (VENTOLIN HFA) 108 (90 Base) MCG/ACT inhaler Inhale 2 puffs into the lungs every 4 (four) hours as needed for wheezing or shortness of breath. 18 each 11   amLODipine (NORVASC) 10 MG tablet Take 1 tablet (10 mg total) by mouth daily. 90 tablet 3   amoxicillin-clavulanate (AUGMENTIN) 875-125 MG tablet Take 1 tablet by mouth 2 (two) times daily. (Patient not taking: Reported on 05/31/2023) 20 tablet 0   aspirin 81 MG chewable tablet Chew 1  tablet (81 mg total) by mouth daily. 90 tablet 1   blood glucose meter kit and supplies Dispense based on patient and insurance preference. Use up to four times daily as directed. (FOR ICD-9 250.00, 250.01). 1 each 0   carvedilol (COREG) 25 MG tablet Take 1 tablet (25 mg total) by mouth 2 (two) times daily. 180 tablet 1   clindamycin (CLINDAGEL) 1 % gel Apply topically 2 (two) times daily. (Patient taking differently: Apply topically as needed.) 30 g 0   Continuous Glucose Sensor (DEXCOM G7 SENSOR) MISC use to monitor blood glucose 9 each 0   cyclobenzaprine (FLEXERIL) 10 MG tablet Take 1 tablet (10 mg total) by mouth 2 (two) times daily as needed for up to 15 doses for  muscle spasms. 15 tablet 0   furosemide (LASIX) 20 MG tablet Take 2 tablets (40 mg total) by mouth in the morning AND 1 tablet (20 mg total) Nightly. (Patient taking differently: Take 2 tablets (40 mg total) by mouth in the morning) 270 tablet 3   hydrOXYzine (ATARAX) 10 MG tablet Take 1 tablet (10 mg total) by mouth 3 (three) times daily as needed for anxiety. 30 tablet 0   ibuprofen (ADVIL) 600 MG tablet Take 1 tablet (600 mg total) by mouth every 8 (eight) hours as needed for up to 21 doses. 21 tablet 0   Insulin Glargine Solostar (LANTUS) 100 UNIT/ML Solostar Pen Inject 25 Units into the skin daily. 3 mL 1   insulin lispro (HUMALOG) 100 UNIT/ML KwikPen Inject 10 Units into the skin 3 (three) times daily. 3 mL 1   metFORMIN (GLUCOPHAGE-XR) 500 MG 24 hr tablet Take 2 tablets (1,000 mg total) by mouth in the morning and at bedtime. 120 tablet 5   nicotine (NICODERM CQ - DOSED IN MG/24 HOURS) 14 mg/24hr patch Place 1 patch (14 mg total) onto the skin daily. (Patient not taking: Reported on 05/31/2023) 28 patch 2   rosuvastatin (CRESTOR) 20 MG tablet Take 1 tablet (20 mg total) by mouth daily. 90 tablet 1   spironolactone (ALDACTONE) 50 MG tablet Take 1 tablet (50 mg total) by mouth daily. 90 tablet 1   torsemide (DEMADEX) 20 MG tablet Take 2 tablets (40 mg total) by mouth daily. 180 tablet 3   valsartan (DIOVAN) 320 MG tablet Take 1 tablet (320 mg total) by mouth daily. 90 tablet 1   Current Facility-Administered Medications on File Prior to Visit  Medication Dose Route Frequency Provider Last Rate Last Admin   cloNIDine (CATAPRES) tablet 0.2 mg  0.2 mg Oral Once Tobb, Kardie, DO        Allergies  Allergen Reactions   Contrast Media [Iodinated Contrast Media] Hives and Swelling     Assessment/Plan:  1. Hypertension -  HYPERTENSION CONTROL Vitals:   05/31/23 1036 05/31/23 1056  BP: (!) 172/120 (!) 172/113    The patient's blood pressure is elevated above target today.  In order to  address the patient's elevated BP: A current anti-hypertensive medication was adjusted today.    Patient BP 172/113 in room which is well above goal of <130/80. Patient has been more compliant with medication regimen recently but it has not reflected in her readings. Will increase hydrochlorothiazide to 25mg  daily and recheck in clinic in 2-3 weeks.  2. DM - Patient;s last A1c 11.8 which is above goal of <7.0% likely due to diet and medication non compliance. Recommended patient begin taking her basal insulin in the mornings rather than bedtime since she self  reports she is more compliant in the morning then at night. Discussed food options at work she should focus on such as chicken, green beans and cole slaw and to avoid mashed potatoes and corn. Recommended she bring in Dexcom sensor to follow up visit and will make sure it is operating correctly and change the alarms on her phone so it is not always alerting her. Patient voiced understanding. Printed copay card for patient for savings help.  Laural Golden, PharmD, BCACP, CDCES, CPP [address] [place], [place], [address] Phone: [phone], Fax: [phone]

## 2023-06-20 ENCOUNTER — Encounter: Payer: Self-pay | Admitting: Pharmacist

## 2023-06-21 ENCOUNTER — Ambulatory Visit: Payer: No Typology Code available for payment source

## 2023-07-05 ENCOUNTER — Ambulatory Visit: Payer: No Typology Code available for payment source | Attending: Cardiology | Admitting: Cardiology

## 2023-07-12 ENCOUNTER — Ambulatory Visit: Payer: No Typology Code available for payment source | Admitting: "Endocrinology

## 2023-07-13 ENCOUNTER — Ambulatory Visit: Payer: No Typology Code available for payment source | Admitting: Cardiology

## 2023-07-13 ENCOUNTER — Ambulatory Visit: Payer: No Typology Code available for payment source | Attending: Nurse Practitioner

## 2023-07-13 NOTE — Progress Notes (Deleted)
Office Visit    Patient Name: Carolyn Zimmerman Date of Encounter: 07/13/2023  Primary Care Provider:  Ivonne Andrew, NP Primary Cardiologist:  Thomasene Ripple, DO  Chief Complaint    Hypertension  Significant Past Medical History   CVA 10/23 acute L cerebral hemisphere compatible with watershed infarct  DM2 8/24 A1c 11.8; on lantus/humalog, metformin  HFmrEF 6/24 echo EF 40-45%  HLD 7/24 TG 357, LDL(d) 64  obesity BMI 42.84    Allergies  Allergen Reactions   Contrast Media [Iodinated Contrast Media] Hives and Swelling    History of Present Illness    Carolyn Zimmerman is a 37 y.o. female patient of Dr Servando Salina, in the office today for hypertension evaluation.  She was most recently seen by Laural Golden PharmD in September, and found to have a BP of 172/113    Blood Pressure Goal:  130/80  Current Medications:  amlodipine 10 mg every day, carvedilol 25 mg bid, hctz 25 mg every day, spironolactone 50 mg every day, valsartan 320 mg qd  Previously tried:    Family Hx:     Social Hx:      Tobacco:  Alcohol:  Caffeine: Diet:      Exercise:   Home BP readings:      Adherence Assessment  Do you ever forget to take your medication? [] Yes [] No  Do you ever skip doses due to side effects? [] Yes [] No  Do you have trouble affording your medicines? [] Yes [] No  Are you ever unable to pick up your medication due to transportation difficulties? [] Yes [] No  Do you ever stop taking your medications because you don't believe they are helping? [] Yes [] No  Do you check your weight daily? [] Yes [] No   Adherence strategy: ***  Barriers to obtaining medications: ***     Accessory Clinical Findings    Lab Results  Component Value Date   CREATININE 0.66 05/14/2023   BUN 12 05/14/2023   NA 134 05/14/2023   K 4.3 05/14/2023   CL 100 05/14/2023   CO2 24 05/14/2023   Lab Results  Component Value Date   ALT 9 05/14/2023   AST 10 05/14/2023   ALKPHOS 112 05/14/2023    BILITOT 0.3 05/14/2023   Lab Results  Component Value Date   HGBA1C 12.0 (H) 03/26/2023    Home Medications    Current Outpatient Medications  Medication Sig Dispense Refill   acetaminophen (TYLENOL) 500 MG tablet Take 2 tablets (1,000 mg total) by mouth every 8 (eight) hours as needed for mild pain. 60 tablet 3   albuterol (VENTOLIN HFA) 108 (90 Base) MCG/ACT inhaler Inhale 2 puffs into the lungs every 4 (four) hours as needed for wheezing or shortness of breath. 18 each 11   amLODipine (NORVASC) 10 MG tablet Take 1 tablet (10 mg total) by mouth daily. 90 tablet 3   amoxicillin-clavulanate (AUGMENTIN) 875-125 MG tablet Take 1 tablet by mouth 2 (two) times daily. (Patient not taking: Reported on 05/31/2023) 20 tablet 0   aspirin 81 MG chewable tablet Chew 1 tablet (81 mg total) by mouth daily. 90 tablet 1   blood glucose meter kit and supplies Dispense based on patient and insurance preference. Use up to four times daily as directed. (FOR ICD-9 250.00, 250.01). 1 each 0   carvedilol (COREG) 25 MG tablet Take 1 tablet (25 mg total) by mouth 2 (two) times daily. 180 tablet 1   clindamycin (CLINDAGEL) 1 % gel Apply topically 2 (two) times daily. (Patient taking  differently: Apply topically as needed.) 30 g 0   Continuous Glucose Sensor (DEXCOM G7 SENSOR) MISC use to monitor blood glucose 9 each 0   cyclobenzaprine (FLEXERIL) 10 MG tablet Take 1 tablet (10 mg total) by mouth 2 (two) times daily as needed for up to 15 doses for muscle spasms. 15 tablet 0   furosemide (LASIX) 20 MG tablet Take 2 tablets (40 mg total) by mouth in the morning AND 1 tablet (20 mg total) Nightly. (Patient taking differently: Take 2 tablets (40 mg total) by mouth in the morning) 270 tablet 3   hydrochlorothiazide (MICROZIDE) 12.5 MG capsule Take 2 capsules (25 mg total) by mouth daily.     hydrOXYzine (ATARAX) 10 MG tablet Take 1 tablet (10 mg total) by mouth 3 (three) times daily as needed for anxiety. 30 tablet 0    ibuprofen (ADVIL) 600 MG tablet Take 1 tablet (600 mg total) by mouth every 8 (eight) hours as needed for up to 21 doses. 21 tablet 0   Insulin Glargine Solostar (LANTUS) 100 UNIT/ML Solostar Pen Inject 25 Units into the skin daily. 3 mL 1   insulin lispro (HUMALOG) 100 UNIT/ML KwikPen Inject 10 Units into the skin 3 (three) times daily. 3 mL 1   metFORMIN (GLUCOPHAGE-XR) 500 MG 24 hr tablet Take 2 tablets (1,000 mg total) by mouth in the morning and at bedtime. 120 tablet 5   nicotine (NICODERM CQ - DOSED IN MG/24 HOURS) 14 mg/24hr patch Place 1 patch (14 mg total) onto the skin daily. (Patient not taking: Reported on 05/31/2023) 28 patch 2   rosuvastatin (CRESTOR) 20 MG tablet Take 1 tablet (20 mg total) by mouth daily. 90 tablet 1   spironolactone (ALDACTONE) 50 MG tablet Take 1 tablet (50 mg total) by mouth daily. 90 tablet 1   torsemide (DEMADEX) 20 MG tablet Take 2 tablets (40 mg total) by mouth daily. 180 tablet 3   valsartan (DIOVAN) 320 MG tablet Take 1 tablet (320 mg total) by mouth daily. 90 tablet 1   Current Facility-Administered Medications  Medication Dose Route Frequency Provider Last Rate Last Admin   cloNIDine (CATAPRES) tablet 0.2 mg  0.2 mg Oral Once Tobb, Kardie, DO         No BP recorded.  {Refresh Note OR Click here to enter BP  :1}***   Assessment & Plan    No problem-specific Assessment & Plan notes found for this encounter.   Phillips Hay PharmD CPP [facility]  [address] [place], [place] [address] [phone]

## 2023-07-19 ENCOUNTER — Other Ambulatory Visit: Payer: Self-pay

## 2023-08-06 ENCOUNTER — Ambulatory Visit: Payer: No Typology Code available for payment source | Admitting: Dermatology

## 2023-08-24 ENCOUNTER — Ambulatory Visit: Payer: Self-pay | Admitting: Nurse Practitioner

## 2023-10-04 ENCOUNTER — Other Ambulatory Visit: Payer: Self-pay

## 2023-10-04 ENCOUNTER — Encounter (HOSPITAL_BASED_OUTPATIENT_CLINIC_OR_DEPARTMENT_OTHER): Payer: Self-pay | Admitting: Emergency Medicine

## 2023-10-04 ENCOUNTER — Emergency Department (HOSPITAL_BASED_OUTPATIENT_CLINIC_OR_DEPARTMENT_OTHER)
Admission: EM | Admit: 2023-10-04 | Discharge: 2023-10-04 | Disposition: A | Discharge status: Home / Self Care | Payer: Self-pay | Attending: Emergency Medicine | Admitting: Emergency Medicine

## 2023-10-04 DIAGNOSIS — Z794 Long term (current) use of insulin: Secondary | ICD-10-CM | POA: Insufficient documentation

## 2023-10-04 DIAGNOSIS — I10 Essential (primary) hypertension: Secondary | ICD-10-CM | POA: Insufficient documentation

## 2023-10-04 DIAGNOSIS — Z79899 Other long term (current) drug therapy: Secondary | ICD-10-CM | POA: Insufficient documentation

## 2023-10-04 DIAGNOSIS — J209 Acute bronchitis, unspecified: Secondary | ICD-10-CM | POA: Insufficient documentation

## 2023-10-04 DIAGNOSIS — Z7982 Long term (current) use of aspirin: Secondary | ICD-10-CM | POA: Insufficient documentation

## 2023-10-04 DIAGNOSIS — Z20822 Contact with and (suspected) exposure to covid-19: Secondary | ICD-10-CM | POA: Insufficient documentation

## 2023-10-04 DIAGNOSIS — E119 Type 2 diabetes mellitus without complications: Secondary | ICD-10-CM | POA: Insufficient documentation

## 2023-10-04 DIAGNOSIS — Z7984 Long term (current) use of oral hypoglycemic drugs: Secondary | ICD-10-CM | POA: Insufficient documentation

## 2023-10-04 LAB — RESP PANEL BY RT-PCR (RSV, FLU A&B, COVID)  RVPGX2
Influenza A by PCR: NEGATIVE
Influenza B by PCR: NEGATIVE
Resp Syncytial Virus by PCR: NEGATIVE
SARS Coronavirus 2 by RT PCR: NEGATIVE

## 2023-10-04 LAB — COMPREHENSIVE METABOLIC PANEL
ALT: 14 U/L (ref 0–44)
AST: 10 U/L — ABNORMAL LOW (ref 15–41)
Albumin: 3.3 g/dL — ABNORMAL LOW (ref 3.5–5.0)
Alkaline Phosphatase: 98 U/L (ref 38–126)
Anion gap: 6 (ref 5–15)
BUN: 17 mg/dL (ref 6–20)
CO2: 29 mmol/L (ref 22–32)
Calcium: 8.8 mg/dL — ABNORMAL LOW (ref 8.9–10.3)
Chloride: 98 mmol/L (ref 98–111)
Creatinine, Ser: 0.98 mg/dL (ref 0.44–1.00)
GFR, Estimated: >60 mL/min (ref >60)
Glucose, Bld: 411 mg/dL — ABNORMAL HIGH (ref 70–99)
Potassium: 3.9 mmol/L (ref 3.5–5.1)
Sodium: 133 mmol/L — ABNORMAL LOW (ref 135–145)
Total Bilirubin: 0.3 mg/dL (ref 0.0–1.2)
Total Protein: 7 g/dL (ref 6.5–8.1)

## 2023-10-04 LAB — URINALYSIS, ROUTINE W REFLEX MICROSCOPIC
Bacteria, UA: NONE SEEN
Bilirubin Urine: NEGATIVE
Glucose, UA: >1000 mg/dL — AB
Ketones, ur: NEGATIVE mg/dL
Leukocytes,Ua: NEGATIVE
Nitrite: NEGATIVE
Protein, ur: 100 mg/dL — AB
Specific Gravity, Urine: 1.007 (ref 1.005–1.030)
pH: 7 (ref 5.0–8.0)

## 2023-10-04 LAB — CBC
HCT: 37.2 % (ref 36.0–46.0)
Hemoglobin: 12.1 g/dL (ref 12.0–15.0)
MCH: 25.7 pg — ABNORMAL LOW (ref 26.0–34.0)
MCHC: 32.5 g/dL (ref 30.0–36.0)
MCV: 79 fL — ABNORMAL LOW (ref 80.0–100.0)
Platelets: 374 10*3/uL (ref 150–400)
RBC: 4.71 MIL/uL (ref 3.87–5.11)
RDW: 14 % (ref 11.5–15.5)
WBC: 12.7 10*3/uL — ABNORMAL HIGH (ref 4.0–10.5)
nRBC: 0 % (ref 0.0–0.2)

## 2023-10-04 LAB — LIPASE, BLOOD: Lipase: 44 U/L (ref 11–51)

## 2023-10-04 LAB — PREGNANCY, URINE: Preg Test, Ur: NEGATIVE

## 2023-10-04 MED ORDER — DOXYCYCLINE HYCLATE 100 MG PO TABS
100.0000 mg | ORAL_TABLET | Freq: Once | ORAL | Status: AC
  Administered 2023-10-04: 100 mg via ORAL
  Filled 2023-10-04: qty 1

## 2023-10-04 MED ORDER — ONDANSETRON HCL 4 MG/2ML IJ SOLN
4.0000 mg | Freq: Once | INTRAMUSCULAR | Status: AC | PRN
  Administered 2023-10-04: 4 mg via INTRAVENOUS
  Filled 2023-10-04: qty 2

## 2023-10-04 MED ORDER — DOXYCYCLINE HYCLATE 100 MG PO TABS
100.0000 mg | ORAL_TABLET | Freq: Two times a day (BID) | ORAL | 0 refills | Status: DC
  Filled 2023-10-04: qty 14, 7d supply, fill #0

## 2023-10-04 MED ORDER — BENZONATATE 100 MG PO CAPS
100.0000 mg | ORAL_CAPSULE | Freq: Three times a day (TID) | ORAL | 0 refills | Status: DC
  Filled 2023-10-04 (×2): qty 21, 7d supply, fill #0

## 2023-10-04 NOTE — Discharge Instructions (Signed)
Begin taking doxycycline as prescribed.  Begin taking Tessalon as prescribed as needed for cough.  Drink plenty of fluids and get plenty of rest.  Follow your blood sugars at home and follow-up with your primary doctor.

## 2023-10-04 NOTE — ED Triage Notes (Signed)
After triage assessment pt started having emesis not mentioned at time of first note.

## 2023-10-04 NOTE — ED Triage Notes (Addendum)
Head, ear, throat pain with a cough and rib pain when coughing since beginning of December.

## 2023-10-04 NOTE — ED Provider Notes (Signed)
[place] EMERGENCY DEPARTMENT AT [facility] Provider Note   CSN: [id number] Arrival date & time: 10/04/23  4098     History  Chief Complaint  Patient presents with   Cough   Sore Throat    Carolyn Zimmerman is a 38 y.o. female.  Patient is a 38 year old female with past medical history of hypertension, type 2 diabetes, hidradenitis suppurativa.  Patient presenting today with complaints of cough, congestion, body aches, sore throat, ear pressure, and facial pain.  This has been ongoing for the past several weeks and unrelieved with over-the-counter medications.  No aggravating factors.  She denies ill contacts.  She also describes some nausea and vomiting, but no diarrhea.  The history is provided by the patient.       Home Medications Prior to Admission medications   Medication Sig Start Date End Date Taking? Authorizing Provider  acetaminophen (TYLENOL) 500 MG tablet Take 2 tablets (1,000 mg total) by mouth every 8 (eight) hours as needed for mild pain. 06/02/20   Kallie Locks, FNP  albuterol (VENTOLIN HFA) 108 (90 Base) MCG/ACT inhaler Inhale 2 puffs into the lungs every 4 (four) hours as needed for wheezing or shortness of breath. 06/02/20   Kallie Locks, FNP  amLODipine (NORVASC) 10 MG tablet Take 1 tablet (10 mg total) by mouth daily. 10/26/22   Ivonne Andrew, NP  amoxicillin-clavulanate (AUGMENTIN) 875-125 MG tablet Take 1 tablet by mouth 2 (two) times daily. Patient not taking: Reported on 05/31/2023 03/29/23   Ivonne Andrew, NP  aspirin 81 MG chewable tablet Chew 1 tablet (81 mg total) by mouth daily. 10/26/22   Ivonne Andrew, NP  blood glucose meter kit and supplies Dispense based on patient and insurance preference. Use up to four times daily as directed. (FOR ICD-9 250.00, 250.01). 06/02/20   Kallie Locks, FNP  carvedilol (COREG) 25 MG tablet Take 1 tablet (25 mg total) by mouth 2 (two) times daily. 10/26/22   Ivonne Andrew, NP  clindamycin  (CLINDAGEL) 1 % gel Apply topically 2 (two) times daily. Patient taking differently: Apply topically as needed. 03/29/23   Ivonne Andrew, NP  Continuous Glucose Sensor (DEXCOM G7 SENSOR) MISC use to monitor blood glucose 03/29/23   Motwani, Carin Hock, MD  cyclobenzaprine (FLEXERIL) 10 MG tablet Take 1 tablet (10 mg total) by mouth 2 (two) times daily as needed for up to 15 doses for muscle spasms. 01/30/23   Terald Sleeper, MD  furosemide (LASIX) 20 MG tablet Take 2 tablets (40 mg total) by mouth in the morning AND 1 tablet (20 mg total) Nightly. Patient taking differently: Take 2 tablets (40 mg total) by mouth in the morning 01/22/23   Tobb, Kardie, DO  hydrochlorothiazide (MICROZIDE) 12.5 MG capsule Take 2 capsules (25 mg total) by mouth daily. 05/31/23   Tobb, Kardie, DO  hydrOXYzine (ATARAX) 10 MG tablet Take 1 tablet (10 mg total) by mouth 3 (three) times daily as needed for anxiety. 04/30/23   Ivonne Andrew, NP  ibuprofen (ADVIL) 600 MG tablet Take 1 tablet (600 mg total) by mouth every 8 (eight) hours as needed for up to 21 doses. 01/30/23   Terald Sleeper, MD  Insulin Glargine Solostar (LANTUS) 100 UNIT/ML Solostar Pen Inject 25 Units into the skin daily. 01/31/23   Ivonne Andrew, NP  insulin lispro (HUMALOG) 100 UNIT/ML KwikPen Inject 10 Units into the skin 3 (three) times daily. 01/31/23   Ivonne Andrew, NP  metFORMIN (  GLUCOPHAGE-XR) 500 MG 24 hr tablet Take 2 tablets (1,000 mg total) by mouth in the morning and at bedtime. 10/26/22   Ivonne Andrew, NP  nicotine (NICODERM CQ - DOSED IN MG/24 HOURS) 14 mg/24hr patch Place 1 patch (14 mg total) onto the skin daily. Patient not taking: Reported on 05/31/2023 03/29/23   Ivonne Andrew, NP  rosuvastatin (CRESTOR) 20 MG tablet Take 1 tablet (20 mg total) by mouth daily. 10/26/22   Ivonne Andrew, NP  spironolactone (ALDACTONE) 50 MG tablet Take 1 tablet (50 mg total) by mouth daily. 11/14/22   Flossie Dibble, NP  torsemide (DEMADEX) 20 MG  tablet Take 2 tablets (40 mg total) by mouth daily. 02/22/23     valsartan (DIOVAN) 320 MG tablet Take 1 tablet (320 mg total) by mouth daily. 10/26/22   Ivonne Andrew, NP      Allergies    Contrast media [iodinated contrast media]    Review of Systems   Review of Systems  All other systems reviewed and are negative.   Physical Exam Updated Vital Signs BP (!) 185/114 (BP Location: Right Arm)   Pulse 92   Temp 98.8 F (37.1 C) (Oral)   Resp 20   Ht 5' 7.5" (1.715 m)   Wt 126.1 kg   LMP 09/13/2023   SpO2 99%   BMI 42.90 kg/m  Physical Exam Vitals and nursing note reviewed.  Constitutional:      General: She is not in acute distress.    Appearance: She is well-developed. She is not diaphoretic.  HENT:     Head: Normocephalic and atraumatic.     Mouth/Throat:     Mouth: Mucous membranes are moist.     Pharynx: Posterior oropharyngeal erythema present. No oropharyngeal exudate.     Tonsils: No tonsillar exudate or tonsillar abscesses.  Cardiovascular:     Rate and Rhythm: Normal rate and regular rhythm.     Heart sounds: No murmur heard.    No friction rub. No gallop.  Pulmonary:     Effort: Pulmonary effort is normal. No respiratory distress.     Breath sounds: Normal breath sounds. No wheezing.  Abdominal:     General: Bowel sounds are normal. There is no distension.     Palpations: Abdomen is soft.     Tenderness: There is no abdominal tenderness.  Musculoskeletal:        General: Normal range of motion.     Cervical back: Normal range of motion and neck supple.  Skin:    General: Skin is warm and dry.  Neurological:     General: No focal deficit present.     Mental Status: She is alert and oriented to person, place, and time.     ED Results / Procedures / Treatments   Labs (all labs ordered are listed, but only abnormal results are displayed) Labs Reviewed  COMPREHENSIVE METABOLIC PANEL - Abnormal; Notable for the following components:      Result Value    Sodium 133 (*)    Glucose, Bld 411 (*)    Calcium 8.8 (*)    Albumin 3.3 (*)    AST 10 (*)    All other components within normal limits  CBC - Abnormal; Notable for the following components:   WBC 12.7 (*)    MCV 79.0 (*)    MCH 25.7 (*)    All other components within normal limits  URINALYSIS, ROUTINE W REFLEX MICROSCOPIC - Abnormal; Notable for the  following components:   Color, Urine COLORLESS (*)    Glucose, UA >1,000 (*)    Hgb urine dipstick SMALL (*)    Protein, ur 100 (*)    All other components within normal limits  RESP PANEL BY RT-PCR (RSV, FLU A&B, COVID)  RVPGX2  LIPASE, BLOOD  PREGNANCY, URINE    EKG None  Radiology No results found.  Procedures Procedures    Medications Ordered in ED Medications  doxycycline (VIBRA-TABS) tablet 100 mg (has no administration in time range)  ondansetron (ZOFRAN) injection 4 mg (4 mg Intravenous Given 10/04/23 0249)    ED Course/ Medical Decision Making/ A&P  Patient is a 38 year old female presenting with URI symptoms persistent over the past 3 weeks.  She has been taking over-the-counter medications with no relief.  Physical examination is basically unremarkable.  Laboratory studies obtained including CBC, CMP, and lipase.  Patient has a white count of 12.7, but laboratory studies otherwise unremarkable.  COVID/flu/RSV all negative.  Urinalysis inconsistent with infection and pregnancy test is negative.  At this point, patient has had an ongoing URI for the past 3 weeks and I feel as though it is reasonable to prescribe a course of antibiotics.  I will prescribe doxycycline and see if this helps.  Patient will also be given Tessalon and advised to continue over-the-counter medications as needed.  Final Clinical Impression(s) / ED Diagnoses Final diagnoses:  None    Rx / DC Orders ED Discharge Orders     None         Geoffery Lyons, MD 10/04/23 [phone]

## 2023-10-11 ENCOUNTER — Inpatient Hospital Stay (HOSPITAL_BASED_OUTPATIENT_CLINIC_OR_DEPARTMENT_OTHER)
Admission: EM | Admit: 2023-10-11 | Discharge: 2023-10-21 | DRG: 871 | Disposition: A | Discharge status: Home Health | Payer: Medicaid Other | Attending: Internal Medicine | Admitting: Internal Medicine

## 2023-10-11 ENCOUNTER — Emergency Department (HOSPITAL_BASED_OUTPATIENT_CLINIC_OR_DEPARTMENT_OTHER): Payer: Medicaid Other | Admitting: Radiology

## 2023-10-11 ENCOUNTER — Emergency Department (HOSPITAL_BASED_OUTPATIENT_CLINIC_OR_DEPARTMENT_OTHER): Payer: Medicaid Other

## 2023-10-11 ENCOUNTER — Encounter (HOSPITAL_BASED_OUTPATIENT_CLINIC_OR_DEPARTMENT_OTHER): Payer: Self-pay

## 2023-10-11 ENCOUNTER — Other Ambulatory Visit: Payer: Self-pay

## 2023-10-11 DIAGNOSIS — Z794 Long term (current) use of insulin: Secondary | ICD-10-CM

## 2023-10-11 DIAGNOSIS — E1165 Type 2 diabetes mellitus with hyperglycemia: Secondary | ICD-10-CM | POA: Diagnosis present

## 2023-10-11 DIAGNOSIS — E1169 Type 2 diabetes mellitus with other specified complication: Secondary | ICD-10-CM | POA: Diagnosis present

## 2023-10-11 DIAGNOSIS — I420 Dilated cardiomyopathy: Secondary | ICD-10-CM | POA: Diagnosis present

## 2023-10-11 DIAGNOSIS — Z7982 Long term (current) use of aspirin: Secondary | ICD-10-CM

## 2023-10-11 DIAGNOSIS — F32A Depression, unspecified: Secondary | ICD-10-CM | POA: Diagnosis present

## 2023-10-11 DIAGNOSIS — R6510 Systemic inflammatory response syndrome (SIRS) of non-infectious origin without acute organ dysfunction: Secondary | ICD-10-CM

## 2023-10-11 DIAGNOSIS — Z91138 Patient's unintentional underdosing of medication regimen for other reason: Secondary | ICD-10-CM

## 2023-10-11 DIAGNOSIS — B351 Tinea unguium: Secondary | ICD-10-CM | POA: Diagnosis present

## 2023-10-11 DIAGNOSIS — I110 Hypertensive heart disease with heart failure: Secondary | ICD-10-CM | POA: Diagnosis present

## 2023-10-11 DIAGNOSIS — Z5986 Financial insecurity: Secondary | ICD-10-CM

## 2023-10-11 DIAGNOSIS — Z8249 Family history of ischemic heart disease and other diseases of the circulatory system: Secondary | ICD-10-CM

## 2023-10-11 DIAGNOSIS — Z9049 Acquired absence of other specified parts of digestive tract: Secondary | ICD-10-CM

## 2023-10-11 DIAGNOSIS — F17210 Nicotine dependence, cigarettes, uncomplicated: Secondary | ICD-10-CM | POA: Diagnosis present

## 2023-10-11 DIAGNOSIS — Z720 Tobacco use: Secondary | ICD-10-CM | POA: Diagnosis present

## 2023-10-11 DIAGNOSIS — I5042 Chronic combined systolic (congestive) and diastolic (congestive) heart failure: Secondary | ICD-10-CM | POA: Diagnosis present

## 2023-10-11 DIAGNOSIS — R6520 Severe sepsis without septic shock: Secondary | ICD-10-CM | POA: Diagnosis present

## 2023-10-11 DIAGNOSIS — L03115 Cellulitis of right lower limb: Secondary | ICD-10-CM | POA: Diagnosis present

## 2023-10-11 DIAGNOSIS — Z91041 Radiographic dye allergy status: Secondary | ICD-10-CM

## 2023-10-11 DIAGNOSIS — T447X6A Underdosing of beta-adrenoreceptor antagonists, initial encounter: Secondary | ICD-10-CM | POA: Diagnosis present

## 2023-10-11 DIAGNOSIS — E871 Hypo-osmolality and hyponatremia: Secondary | ICD-10-CM | POA: Diagnosis present

## 2023-10-11 DIAGNOSIS — I10 Essential (primary) hypertension: Secondary | ICD-10-CM | POA: Diagnosis present

## 2023-10-11 DIAGNOSIS — A419 Sepsis, unspecified organism: Principal | ICD-10-CM | POA: Diagnosis present

## 2023-10-11 DIAGNOSIS — M79605 Pain in left leg: Secondary | ICD-10-CM | POA: Diagnosis present

## 2023-10-11 DIAGNOSIS — D72829 Elevated white blood cell count, unspecified: Principal | ICD-10-CM

## 2023-10-11 DIAGNOSIS — Z23 Encounter for immunization: Secondary | ICD-10-CM

## 2023-10-11 DIAGNOSIS — E119 Type 2 diabetes mellitus without complications: Secondary | ICD-10-CM

## 2023-10-11 DIAGNOSIS — Z6841 Body Mass Index (BMI) 40.0 and over, adult: Secondary | ICD-10-CM

## 2023-10-11 DIAGNOSIS — Z833 Family history of diabetes mellitus: Secondary | ICD-10-CM

## 2023-10-11 DIAGNOSIS — Z8673 Personal history of transient ischemic attack (TIA), and cerebral infarction without residual deficits: Secondary | ICD-10-CM

## 2023-10-11 DIAGNOSIS — F419 Anxiety disorder, unspecified: Secondary | ICD-10-CM | POA: Diagnosis present

## 2023-10-11 DIAGNOSIS — D638 Anemia in other chronic diseases classified elsewhere: Secondary | ICD-10-CM | POA: Diagnosis present

## 2023-10-11 DIAGNOSIS — E876 Hypokalemia: Secondary | ICD-10-CM | POA: Diagnosis present

## 2023-10-11 DIAGNOSIS — Z91148 Patient's other noncompliance with medication regimen for other reason: Secondary | ICD-10-CM

## 2023-10-11 DIAGNOSIS — E43 Unspecified severe protein-calorie malnutrition: Secondary | ICD-10-CM | POA: Diagnosis present

## 2023-10-11 DIAGNOSIS — E66813 Obesity, class 3: Secondary | ICD-10-CM | POA: Diagnosis present

## 2023-10-11 DIAGNOSIS — Z7984 Long term (current) use of oral hypoglycemic drugs: Secondary | ICD-10-CM

## 2023-10-11 DIAGNOSIS — Z5941 Food insecurity: Secondary | ICD-10-CM

## 2023-10-11 DIAGNOSIS — E782 Mixed hyperlipidemia: Secondary | ICD-10-CM | POA: Diagnosis present

## 2023-10-11 DIAGNOSIS — Z1152 Encounter for screening for COVID-19: Secondary | ICD-10-CM

## 2023-10-11 DIAGNOSIS — Z8616 Personal history of COVID-19: Secondary | ICD-10-CM

## 2023-10-11 DIAGNOSIS — Z79899 Other long term (current) drug therapy: Secondary | ICD-10-CM

## 2023-10-11 LAB — CBC WITH DIFFERENTIAL/PLATELET
Abs Immature Granulocytes: 0 10*3/uL (ref 0.00–0.07)
Band Neutrophils: 3 %
Basophils Absolute: 0 10*3/uL (ref 0.0–0.1)
Basophils Relative: 0 %
Eosinophils Absolute: 0 10*3/uL (ref 0.0–0.5)
Eosinophils Relative: 0 %
HCT: 34.4 % — ABNORMAL LOW (ref 36.0–46.0)
Hemoglobin: 11.3 g/dL — ABNORMAL LOW (ref 12.0–15.0)
Lymphocytes Relative: 3 %
Lymphs Abs: 1.2 10*3/uL (ref 0.7–4.0)
MCH: 26 pg (ref 26.0–34.0)
MCHC: 32.8 g/dL (ref 30.0–36.0)
MCV: 79.1 fL — ABNORMAL LOW (ref 80.0–100.0)
Monocytes Absolute: 2.3 10*3/uL — ABNORMAL HIGH (ref 0.1–1.0)
Monocytes Relative: 6 %
Neutro Abs: 34.9 10*3/uL — ABNORMAL HIGH (ref 1.7–7.7)
Neutrophils Relative %: 88 %
Platelets: 336 10*3/uL (ref 150–400)
RBC: 4.35 MIL/uL (ref 3.87–5.11)
RDW: 14.4 % (ref 11.5–15.5)
WBC: 38.4 10*3/uL — ABNORMAL HIGH (ref 4.0–10.5)
nRBC: 0 % (ref 0.0–0.2)

## 2023-10-11 LAB — URINALYSIS, ROUTINE W REFLEX MICROSCOPIC
Bacteria, UA: NONE SEEN
Bilirubin Urine: NEGATIVE
Glucose, UA: >1000 mg/dL — AB
Ketones, ur: NEGATIVE mg/dL
Leukocytes,Ua: NEGATIVE
Nitrite: NEGATIVE
Protein, ur: >300 mg/dL — AB
Specific Gravity, Urine: 1.035 — ABNORMAL HIGH (ref 1.005–1.030)
pH: 6.5 (ref 5.0–8.0)

## 2023-10-11 LAB — LACTIC ACID, PLASMA: Lactic Acid, Venous: 2.5 mmol/L (ref 0.5–1.9)

## 2023-10-11 LAB — GROUP A STREP BY PCR: Group A Strep by PCR: NOT DETECTED

## 2023-10-11 LAB — RESP PANEL BY RT-PCR (RSV, FLU A&B, COVID)  RVPGX2
Influenza A by PCR: NEGATIVE
Influenza B by PCR: NEGATIVE
Resp Syncytial Virus by PCR: NEGATIVE
SARS Coronavirus 2 by RT PCR: NEGATIVE

## 2023-10-11 LAB — HCG, SERUM, QUALITATIVE: Preg, Serum: NEGATIVE

## 2023-10-11 MED ORDER — VANCOMYCIN HCL IN DEXTROSE 1-5 GM/200ML-% IV SOLN
1000.0000 mg | Freq: Once | INTRAVENOUS | Status: DC
  Filled 2023-10-11: qty 200

## 2023-10-11 MED ORDER — ACETAMINOPHEN 325 MG PO TABS
650.0000 mg | ORAL_TABLET | Freq: Once | ORAL | Status: AC | PRN
  Administered 2023-10-11: 650 mg via ORAL
  Filled 2023-10-11: qty 2

## 2023-10-11 MED ORDER — LACTATED RINGERS IV BOLUS (SEPSIS)
1000.0000 mL | Freq: Once | INTRAVENOUS | Status: AC
  Administered 2023-10-11: 1000 mL via INTRAVENOUS

## 2023-10-11 MED ORDER — VANCOMYCIN HCL IN DEXTROSE 1-5 GM/200ML-% IV SOLN
1000.0000 mg | Freq: Once | INTRAVENOUS | Status: AC
  Administered 2023-10-12: 1000 mg via INTRAVENOUS
  Filled 2023-10-11: qty 200

## 2023-10-11 MED ORDER — IBUPROFEN 400 MG PO TABS
400.0000 mg | ORAL_TABLET | Freq: Once | ORAL | Status: AC | PRN
  Administered 2023-10-11: 400 mg via ORAL

## 2023-10-11 MED ORDER — ONDANSETRON HCL 4 MG/2ML IJ SOLN
4.0000 mg | Freq: Once | INTRAMUSCULAR | Status: AC
  Administered 2023-10-11: 4 mg via INTRAVENOUS
  Filled 2023-10-11: qty 2

## 2023-10-11 MED ORDER — LACTATED RINGERS IV SOLN: INTRAVENOUS | Status: DC

## 2023-10-11 MED ORDER — VANCOMYCIN HCL IN DEXTROSE 1-5 GM/200ML-% IV SOLN
1000.0000 mg | Freq: Once | INTRAVENOUS | Status: AC
  Administered 2023-10-11: 1000 mg via INTRAVENOUS
  Filled 2023-10-11: qty 200

## 2023-10-11 MED ORDER — ONDANSETRON 4 MG PO TBDP
4.0000 mg | ORAL_TABLET | Freq: Once | ORAL | Status: AC
  Administered 2023-10-11: 4 mg via ORAL
  Filled 2023-10-11: qty 1

## 2023-10-11 MED ORDER — SODIUM CHLORIDE 0.9 % IV SOLN
2.0000 g | Freq: Once | INTRAVENOUS | Status: AC
  Administered 2023-10-11: 2 g via INTRAVENOUS
  Filled 2023-10-11: qty 12.5

## 2023-10-11 MED ORDER — METRONIDAZOLE 500 MG/100ML IV SOLN
500.0000 mg | Freq: Once | INTRAVENOUS | Status: AC
  Administered 2023-10-11: 500 mg via INTRAVENOUS
  Filled 2023-10-11: qty 100

## 2023-10-11 NOTE — ED Notes (Signed)
RN called pt's name again, she was asleep on the bench.  Pt's visitor sitting in the wheelchair, RN asked what pt's name was she said Carolyn Zimmerman.  RN took her to the room.

## 2023-10-11 NOTE — ED Triage Notes (Signed)
Sudden onset body aches emesis 1pm. Took ibuprofen 2pm. Continues to have chills and nausea. Denies CP SOB. Febrile in triage.

## 2023-10-11 NOTE — ED Notes (Signed)
Pt did not answer to name being called in the waiting room.

## 2023-10-11 NOTE — ED Notes (Signed)
Pt transported to imaging.

## 2023-10-11 NOTE — ED Notes (Signed)
Pt found sleeping in the waiting room, will get next room.

## 2023-10-11 NOTE — ED Provider Notes (Signed)
[place] EMERGENCY DEPARTMENT AT [facility] Provider Note   CSN: [id number] Arrival date & time: 10/11/23  1656     History  Chief Complaint  Patient presents with   Nausea    Carolyn Zimmerman is a 38 y.o. female, history of type 2 diabetes, necrotizing fasciitis, who presents to the ED secondary to myalgias, nausea, vomiting, and low back pain, that started today.  She states she developed low back pain, while she was at work, and that she began vomiting when she got home.  She feels like she has bodyaches all over but especially her right lower extremity.  Denies any urinary symptoms, shortness of breath, cough, sore throat.  She states that she has been to hot and cold, and has not taken anything for her symptoms.  Notes she just wanted to get checked out.  Denies any rashes, or trauma to the extremity.  Home Medications Prior to Admission medications   Medication Sig Start Date End Date Taking? Authorizing Provider  acetaminophen (TYLENOL) 500 MG tablet Take 2 tablets (1,000 mg total) by mouth every 8 (eight) hours as needed for mild pain. 06/02/20   Kallie Locks, FNP  albuterol (VENTOLIN HFA) 108 (90 Base) MCG/ACT inhaler Inhale 2 puffs into the lungs every 4 (four) hours as needed for wheezing or shortness of breath. 06/02/20   Kallie Locks, FNP  amLODipine (NORVASC) 10 MG tablet Take 1 tablet (10 mg total) by mouth daily. 10/26/22   Ivonne Andrew, NP  amoxicillin-clavulanate (AUGMENTIN) 875-125 MG tablet Take 1 tablet by mouth 2 (two) times daily. Patient not taking: Reported on 05/31/2023 03/29/23   Ivonne Andrew, NP  aspirin 81 MG chewable tablet Chew 1 tablet (81 mg total) by mouth daily. 10/26/22   Ivonne Andrew, NP  benzonatate (TESSALON) 100 MG capsule Take 1 capsule (100 mg total) by mouth every 8 (eight) hours. 10/04/23   Geoffery Lyons, MD  blood glucose meter kit and supplies Dispense based on patient and insurance preference. Use up to four times daily as  directed. (FOR ICD-9 250.00, 250.01). 06/02/20   Kallie Locks, FNP  carvedilol (COREG) 25 MG tablet Take 1 tablet (25 mg total) by mouth 2 (two) times daily. 10/26/22   Ivonne Andrew, NP  clindamycin (CLINDAGEL) 1 % gel Apply topically 2 (two) times daily. Patient taking differently: Apply topically as needed. 03/29/23   Ivonne Andrew, NP  Continuous Glucose Sensor (DEXCOM G7 SENSOR) MISC use to monitor blood glucose 03/29/23   Motwani, Carin Hock, MD  cyclobenzaprine (FLEXERIL) 10 MG tablet Take 1 tablet (10 mg total) by mouth 2 (two) times daily as needed for up to 15 doses for muscle spasms. 01/30/23   Terald Sleeper, MD  doxycycline (VIBRA-TABS) 100 MG tablet Take 1 tablet (100 mg total) by mouth 2 (two) times daily. 10/04/23   Geoffery Lyons, MD  furosemide (LASIX) 20 MG tablet Take 2 tablets (40 mg total) by mouth in the morning AND 1 tablet (20 mg total) Nightly. Patient taking differently: Take 2 tablets (40 mg total) by mouth in the morning 01/22/23   Tobb, Kardie, DO  hydrochlorothiazide (MICROZIDE) 12.5 MG capsule Take 2 capsules (25 mg total) by mouth daily. 05/31/23   Tobb, Kardie, DO  hydrOXYzine (ATARAX) 10 MG tablet Take 1 tablet (10 mg total) by mouth 3 (three) times daily as needed for anxiety. 04/30/23   Ivonne Andrew, NP  ibuprofen (ADVIL) 600 MG tablet Take 1 tablet (600 mg  total) by mouth every 8 (eight) hours as needed for up to 21 doses. 01/30/23   Terald Sleeper, MD  Insulin Glargine Solostar (LANTUS) 100 UNIT/ML Solostar Pen Inject 25 Units into the skin daily. 01/31/23   Ivonne Andrew, NP  insulin lispro (HUMALOG) 100 UNIT/ML KwikPen Inject 10 Units into the skin 3 (three) times daily. 01/31/23   Ivonne Andrew, NP  metFORMIN (GLUCOPHAGE-XR) 500 MG 24 hr tablet Take 2 tablets (1,000 mg total) by mouth in the morning and at bedtime. 10/26/22   Ivonne Andrew, NP  nicotine (NICODERM CQ - DOSED IN MG/24 HOURS) 14 mg/24hr patch Place 1 patch (14 mg total) onto the skin  daily. Patient not taking: Reported on 05/31/2023 03/29/23   Ivonne Andrew, NP  rosuvastatin (CRESTOR) 20 MG tablet Take 1 tablet (20 mg total) by mouth daily. 10/26/22   Ivonne Andrew, NP  spironolactone (ALDACTONE) 50 MG tablet Take 1 tablet (50 mg total) by mouth daily. 11/14/22   Flossie Dibble, NP  torsemide (DEMADEX) 20 MG tablet Take 2 tablets (40 mg total) by mouth daily. 02/22/23     valsartan (DIOVAN) 320 MG tablet Take 1 tablet (320 mg total) by mouth daily. 10/26/22   Ivonne Andrew, NP      Allergies    Contrast media [iodinated contrast media]    Review of Systems   Review of Systems  Constitutional:  Positive for fever.  Cardiovascular:  Negative for chest pain and leg swelling.    Physical Exam Updated Vital Signs BP (!) 141/62   Pulse 100   Temp 99.9 F (37.7 C) (Oral)   Resp (!) 21   LMP 09/13/2023   SpO2 99%  Physical Exam Vitals and nursing note reviewed.  Constitutional:      General: She is not in acute distress.    Appearance: She is well-developed.  HENT:     Head: Normocephalic and atraumatic.  Eyes:     Conjunctiva/sclera: Conjunctivae normal.  Cardiovascular:     Rate and Rhythm: Normal rate and regular rhythm.     Heart sounds: No murmur heard. Pulmonary:     Effort: Pulmonary effort is normal. No respiratory distress.     Breath sounds: Normal breath sounds.  Abdominal:     Palpations: Abdomen is soft.     Tenderness: There is no abdominal tenderness.  Musculoskeletal:        General: No swelling.     Cervical back: Neck supple.     Comments: Tenderness to palpation generalized, however more tender to right lower extremity including tibia, and fibula, and femur.  She does not have any calf tenderness, but mostly has anterior leg tenderness.  There is no rash, or erythema noted. +dorsalis pedis pulse  Skin:    General: Skin is warm and dry.     Capillary Refill: Capillary refill takes less than 2 seconds.  Neurological:     Mental  Status: She is alert.  Psychiatric:        Mood and Affect: Mood normal.     ED Results / Procedures / Treatments   Labs (all labs ordered are listed, but only abnormal results are displayed) Labs Reviewed  CBC WITH DIFFERENTIAL/PLATELET - Abnormal; Notable for the following components:      Result Value   WBC 38.4 (*)    Hemoglobin 11.3 (*)    HCT 34.4 (*)    MCV 79.1 (*)    Neutro Abs 34.9 (*)  Monocytes Absolute 2.3 (*)    All other components within normal limits  LACTIC ACID, PLASMA - Abnormal; Notable for the following components:   Lactic Acid, Venous 2.5 (*)    All other components within normal limits  URINALYSIS, ROUTINE W REFLEX MICROSCOPIC - Abnormal; Notable for the following components:   APPearance HAZY (*)    Specific Gravity, Urine 1.035 (*)    Glucose, UA >1,000 (*)    Hgb urine dipstick MODERATE (*)    Protein, ur >300 (*)    All other components within normal limits  RESP PANEL BY RT-PCR (RSV, FLU A&B, COVID)  [id number]  GROUP A STREP BY PCR  CULTURE, BLOOD (ROUTINE X 2)  CULTURE, BLOOD (ROUTINE X 2)  HCG, SERUM, QUALITATIVE  LACTIC ACID, PLASMA  COMPREHENSIVE METABOLIC PANEL    EKG None  Radiology DG Tibia/Fibula Right Result Date: 10/11/2023 CLINICAL DATA:  Leg pain EXAM: RIGHT TIBIA AND FIBULA - 2 VIEW COMPARISON:  None Available. FINDINGS: No fracture or malalignment. Extensive subcutaneous edema. No osseous erosive change IMPRESSION: No acute osseous abnormality Electronically Signed   By: Jasmine Pang M.D.   On: 10/11/2023 23:00   DG Femur Min 2 Views Right Result Date: 10/11/2023 CLINICAL DATA:  Leg pain fever EXAM: RIGHT FEMUR 2 VIEWS COMPARISON:  None Available. FINDINGS: There is no evidence of fracture or other focal bone lesions. Soft tissues are unremarkable. IMPRESSION: Negative. Electronically Signed   By: Jasmine Pang M.D.   On: 10/11/2023 22:59   DG Chest 2 View Result Date: 10/11/2023 CLINICAL DATA:  Fever and flu-like symptoms.  EXAM: CHEST - 2 VIEW COMPARISON:  04/28/2023 FINDINGS: Stable cardiomegaly. Pulmonary vascular congestion. Left mid lung atelectasis/scarring. The lungs are otherwise clear. No pleural effusion or pneumothorax. IMPRESSION: Cardiomegaly and pulmonary vascular congestion. Electronically Signed   By: Minerva Fester M.D.   On: 10/11/2023 20:17    Procedures Procedures    Medications Ordered in ED Medications  lactated ringers infusion ( Intravenous New Bag/Given 10/11/23 2348)  lactated ringers bolus 1,000 mL (1,000 mLs Intravenous New Bag/Given 10/11/23 2347)  ceFEPIme (MAXIPIME) 2 g in sodium chloride 0.9 % 100 mL IVPB (2 g Intravenous New Bag/Given 10/11/23 2351)  metroNIDAZOLE (FLAGYL) IVPB 500 mg (500 mg Intravenous New Bag/Given 10/11/23 2351)  vancomycin (VANCOCIN) IVPB 1000 mg/200 mL premix (1,000 mg Intravenous New Bag/Given 10/11/23 2352)    Followed by  vancomycin (VANCOCIN) IVPB 1000 mg/200 mL premix (has no administration in time range)  acetaminophen (TYLENOL) tablet 650 mg (650 mg Oral Given 10/11/23 1742)  ondansetron (ZOFRAN-ODT) disintegrating tablet 4 mg (4 mg Oral Given 10/11/23 1742)  ibuprofen (ADVIL) tablet 400 mg (400 mg Oral Given 10/11/23 1743)  ondansetron (ZOFRAN) injection 4 mg (4 mg Intravenous Given 10/11/23 2356)    ED Course/ Medical Decision Making/ A&P                                 Medical Decision Making Patient is a 38 year old female, here for fever, chills, feeling unwell, for the last day.  Complains of bilateral lower extremity pain, that started today.  No known trauma.  Will obtain x-rays, blood work, lactic acid, with secondary tachycardia, as well as blood cultures.  She does have a extensive history, for sepsis, necrotizing fasciitis, tubo-ovarian abscesses, and uncontrolled diabetes.  Code sepsis initiated.  Initially did not bolus large amounts of fluids, given history of CHF, and congestion, chest x-ray  Amount and/or Complexity of Data  Reviewed Labs: ordered.    Details: Leukocytosis of 38K, lactic acid of 2.5 Radiology: ordered.    Details: X-rays of tibia/fibula, femur, unremarkable.  Chest x-ray shows cardiomegaly. Discussion of management or test interpretation with external provider(s): Patient has a leukocytosis of 38K, lactic acid 2.5, sepsis protocol initiated.  She is febrile, and tachycardic.  She complains of right lower extremity pain, her urinalysis and chest x-ray are clear.  Her x-rays are clear, CT is pending at this time. Handed off to Dr. Preston Fleeting, with plans for admission for further antibiotics d/t concern for infection of unknown etiology at this time  Risk OTC drugs. Prescription drug management.    Final Clinical Impression(s) / ED Diagnoses Final diagnoses:  Leukocytosis, unspecified type  SIRS (systemic inflammatory response syndrome) [facility])    Rx / DC Orders ED Discharge Orders     None         Joniah Bednarski, Harley [place], PA 10/12/23 0000    Benjiman Core, MD 10/15/23 [phone]

## 2023-10-11 NOTE — ED Notes (Signed)
Both Provider, Dr. Preston Fleeting and primary RN made aware of critical lactic acid result.

## 2023-10-11 NOTE — Sepsis Progress Note (Addendum)
Elink monitoring for the code sepsis protocol.   0315: Notified bedside nurse of need to draw 3rd lactic acid.

## 2023-10-12 ENCOUNTER — Encounter (HOSPITAL_COMMUNITY): Payer: Self-pay | Admitting: Internal Medicine

## 2023-10-12 DIAGNOSIS — A419 Sepsis, unspecified organism: Secondary | ICD-10-CM | POA: Diagnosis present

## 2023-10-12 DIAGNOSIS — I110 Hypertensive heart disease with heart failure: Secondary | ICD-10-CM | POA: Diagnosis present

## 2023-10-12 DIAGNOSIS — Z720 Tobacco use: Secondary | ICD-10-CM | POA: Diagnosis present

## 2023-10-12 DIAGNOSIS — M79604 Pain in right leg: Secondary | ICD-10-CM | POA: Diagnosis present

## 2023-10-12 DIAGNOSIS — I5042 Chronic combined systolic (congestive) and diastolic (congestive) heart failure: Secondary | ICD-10-CM | POA: Diagnosis present

## 2023-10-12 DIAGNOSIS — E43 Unspecified severe protein-calorie malnutrition: Secondary | ICD-10-CM | POA: Diagnosis present

## 2023-10-12 DIAGNOSIS — D638 Anemia in other chronic diseases classified elsewhere: Secondary | ICD-10-CM | POA: Diagnosis present

## 2023-10-12 DIAGNOSIS — Z1152 Encounter for screening for COVID-19: Secondary | ICD-10-CM | POA: Diagnosis not present

## 2023-10-12 DIAGNOSIS — Z794 Long term (current) use of insulin: Secondary | ICD-10-CM | POA: Diagnosis not present

## 2023-10-12 DIAGNOSIS — F32A Depression, unspecified: Secondary | ICD-10-CM | POA: Diagnosis present

## 2023-10-12 DIAGNOSIS — L03115 Cellulitis of right lower limb: Secondary | ICD-10-CM | POA: Diagnosis present

## 2023-10-12 DIAGNOSIS — E876 Hypokalemia: Secondary | ICD-10-CM | POA: Diagnosis present

## 2023-10-12 DIAGNOSIS — E1165 Type 2 diabetes mellitus with hyperglycemia: Secondary | ICD-10-CM | POA: Diagnosis present

## 2023-10-12 DIAGNOSIS — E871 Hypo-osmolality and hyponatremia: Secondary | ICD-10-CM | POA: Diagnosis present

## 2023-10-12 DIAGNOSIS — Z23 Encounter for immunization: Secondary | ICD-10-CM | POA: Diagnosis not present

## 2023-10-12 DIAGNOSIS — E1169 Type 2 diabetes mellitus with other specified complication: Secondary | ICD-10-CM | POA: Diagnosis present

## 2023-10-12 DIAGNOSIS — E782 Mixed hyperlipidemia: Secondary | ICD-10-CM | POA: Diagnosis present

## 2023-10-12 DIAGNOSIS — I420 Dilated cardiomyopathy: Secondary | ICD-10-CM | POA: Diagnosis present

## 2023-10-12 DIAGNOSIS — F17210 Nicotine dependence, cigarettes, uncomplicated: Secondary | ICD-10-CM | POA: Diagnosis present

## 2023-10-12 DIAGNOSIS — Z6841 Body Mass Index (BMI) 40.0 and over, adult: Secondary | ICD-10-CM | POA: Diagnosis not present

## 2023-10-12 DIAGNOSIS — R6520 Severe sepsis without septic shock: Secondary | ICD-10-CM | POA: Diagnosis present

## 2023-10-12 DIAGNOSIS — E66813 Obesity, class 3: Secondary | ICD-10-CM | POA: Diagnosis present

## 2023-10-12 DIAGNOSIS — I10 Essential (primary) hypertension: Secondary | ICD-10-CM | POA: Diagnosis present

## 2023-10-12 DIAGNOSIS — Z8616 Personal history of COVID-19: Secondary | ICD-10-CM | POA: Diagnosis not present

## 2023-10-12 LAB — CBC WITH DIFFERENTIAL/PLATELET
Abs Immature Granulocytes: 0.75 10*3/uL — ABNORMAL HIGH (ref 0.00–0.07)
Basophils Absolute: 0.1 10*3/uL (ref 0.0–0.1)
Basophils Relative: 0 %
Eosinophils Absolute: 0 10*3/uL (ref 0.0–0.5)
Eosinophils Relative: 0 %
HCT: 34 % — ABNORMAL LOW (ref 36.0–46.0)
Hemoglobin: 10.6 g/dL — ABNORMAL LOW (ref 12.0–15.0)
Immature Granulocytes: 2 %
Lymphocytes Relative: 8 %
Lymphs Abs: 2.8 10*3/uL (ref 0.7–4.0)
MCH: 26 pg (ref 26.0–34.0)
MCHC: 31.2 g/dL (ref 30.0–36.0)
MCV: 83.5 fL (ref 80.0–100.0)
Monocytes Absolute: 1.4 10*3/uL — ABNORMAL HIGH (ref 0.1–1.0)
Monocytes Relative: 4 %
Neutro Abs: 29.2 10*3/uL — ABNORMAL HIGH (ref 1.7–7.7)
Neutrophils Relative %: 86 %
Platelets: 280 10*3/uL (ref 150–400)
RBC: 4.07 MIL/uL (ref 3.87–5.11)
RDW: 14.4 % (ref 11.5–15.5)
WBC: 34.4 10*3/uL — ABNORMAL HIGH (ref 4.0–10.5)
nRBC: 0 % (ref 0.0–0.2)

## 2023-10-12 LAB — COMPREHENSIVE METABOLIC PANEL
ALT: 15 U/L (ref 0–44)
ALT: 17 U/L (ref 0–44)
AST: 16 U/L (ref 15–41)
AST: 21 U/L (ref 15–41)
Albumin: 2.8 g/dL — ABNORMAL LOW (ref 3.5–5.0)
Albumin: 2 g/dL — ABNORMAL LOW (ref 3.5–5.0)
Alkaline Phosphatase: 68 U/L (ref 38–126)
Alkaline Phosphatase: 59 U/L (ref 38–126)
Anion gap: 8 (ref 5–15)
Anion gap: 10 (ref 5–15)
BUN: 11 mg/dL (ref 6–20)
BUN: 11 mg/dL (ref 6–20)
CO2: 23 mmol/L (ref 22–32)
CO2: 23 mmol/L (ref 22–32)
Calcium: 7.9 mg/dL — ABNORMAL LOW (ref 8.9–10.3)
Calcium: 7.7 mg/dL — ABNORMAL LOW (ref 8.9–10.3)
Chloride: 99 mmol/L (ref 98–111)
Chloride: 102 mmol/L (ref 98–111)
Creatinine, Ser: 1.19 mg/dL — ABNORMAL HIGH (ref 0.44–1.00)
Creatinine, Ser: 1.01 mg/dL — ABNORMAL HIGH (ref 0.44–1.00)
GFR, Estimated: >60 mL/min (ref >60)
GFR, Estimated: >60 mL/min (ref >60)
Glucose, Bld: 374 mg/dL — ABNORMAL HIGH (ref 70–99)
Glucose, Bld: 124 mg/dL — ABNORMAL HIGH (ref 70–99)
Potassium: 3.6 mmol/L (ref 3.5–5.1)
Potassium: 3.4 mmol/L — ABNORMAL LOW (ref 3.5–5.1)
Sodium: 130 mmol/L — ABNORMAL LOW (ref 135–145)
Sodium: 135 mmol/L (ref 135–145)
Total Bilirubin: 0.6 mg/dL (ref 0.0–1.2)
Total Bilirubin: 0.5 mg/dL (ref 0.0–1.2)
Total Protein: 5.7 g/dL — ABNORMAL LOW (ref 6.5–8.1)
Total Protein: 5.6 g/dL — ABNORMAL LOW (ref 6.5–8.1)

## 2023-10-12 LAB — LACTIC ACID, PLASMA
Lactic Acid, Venous: 2.6 mmol/L (ref 0.5–1.9)
Lactic Acid, Venous: 2.9 mmol/L (ref 0.5–1.9)
Lactic Acid, Venous: 1.9 mmol/L (ref 0.5–1.9)

## 2023-10-12 LAB — GLUCOSE, CAPILLARY
Glucose-Capillary: 130 mg/dL — ABNORMAL HIGH (ref 70–99)
Glucose-Capillary: 211 mg/dL — ABNORMAL HIGH (ref 70–99)

## 2023-10-12 LAB — MAGNESIUM: Magnesium: 1.6 mg/dL — ABNORMAL LOW (ref 1.7–2.4)

## 2023-10-12 LAB — HEMOGLOBIN A1C
Hgb A1c MFr Bld: 11.7 % — ABNORMAL HIGH (ref 4.8–5.6)
Mean Plasma Glucose: 289.09 mg/dL

## 2023-10-12 LAB — PHOSPHORUS: Phosphorus: 2 mg/dL — ABNORMAL LOW (ref 2.5–4.6)

## 2023-10-12 LAB — CBG MONITORING, ED
Glucose-Capillary: 263 mg/dL — ABNORMAL HIGH (ref 70–99)
Glucose-Capillary: 175 mg/dL — ABNORMAL HIGH (ref 70–99)

## 2023-10-12 MED ORDER — INSULIN GLARGINE-YFGN 100 UNIT/ML ~~LOC~~ SOLN
25.0000 [IU] | Freq: Every day | SUBCUTANEOUS | Status: DC
  Administered 2023-10-13: 25 [IU] via SUBCUTANEOUS
  Filled 2023-10-12: qty 0.25

## 2023-10-12 MED ORDER — CEFTRIAXONE SODIUM 2 G IJ SOLR
2.0000 g | INTRAMUSCULAR | Status: DC
  Administered 2023-10-12 – 2023-10-18 (×7): 2 g via INTRAVENOUS
  Filled 2023-10-12 (×7): qty 20

## 2023-10-12 MED ORDER — INSULIN ASPART 100 UNIT/ML IJ SOLN
0.0000 [IU] | Freq: Three times a day (TID) | INTRAMUSCULAR | Status: DC
  Administered 2023-10-12: 3 [IU] via SUBCUTANEOUS
  Administered 2023-10-13: 7 [IU] via SUBCUTANEOUS
  Administered 2023-10-13: 4 [IU] via SUBCUTANEOUS
  Administered 2023-10-13: 7 [IU] via SUBCUTANEOUS
  Administered 2023-10-14: 4 [IU] via SUBCUTANEOUS
  Administered 2023-10-14: 3 [IU] via SUBCUTANEOUS
  Administered 2023-10-15 (×2): 4 [IU] via SUBCUTANEOUS
  Administered 2023-10-15: 3 [IU] via SUBCUTANEOUS
  Administered 2023-10-16: 7 [IU] via SUBCUTANEOUS
  Administered 2023-10-16 – 2023-10-18 (×6): 4 [IU] via SUBCUTANEOUS
  Administered 2023-10-19 (×2): 3 [IU] via SUBCUTANEOUS
  Administered 2023-10-19: 4 [IU] via SUBCUTANEOUS
  Administered 2023-10-20: 3 [IU] via SUBCUTANEOUS
  Administered 2023-10-20: 7 [IU] via SUBCUTANEOUS
  Administered 2023-10-21: 4 [IU] via SUBCUTANEOUS

## 2023-10-12 MED ORDER — NICOTINE 21 MG/24HR TD PT24
21.0000 mg | MEDICATED_PATCH | Freq: Every day | TRANSDERMAL | Status: DC
  Administered 2023-10-12 – 2023-10-21 (×10): 21 mg via TRANSDERMAL
  Filled 2023-10-12 (×10): qty 1

## 2023-10-12 MED ORDER — ONDANSETRON HCL 4 MG PO TABS
4.0000 mg | ORAL_TABLET | Freq: Four times a day (QID) | ORAL | Status: DC | PRN
  Administered 2023-10-17: 4 mg via ORAL
  Filled 2023-10-12: qty 1

## 2023-10-12 MED ORDER — INFLUENZA VIRUS VACC SPLIT PF (FLUZONE) 0.5 ML IM SUSY
0.5000 mL | PREFILLED_SYRINGE | INTRAMUSCULAR | Status: AC
  Administered 2023-10-13: 0.5 mL via INTRAMUSCULAR
  Filled 2023-10-12: qty 0.5

## 2023-10-12 MED ORDER — CARVEDILOL 12.5 MG PO TABS: 12.5000 mg | ORAL_TABLET | Freq: Two times a day (BID) | ORAL | Status: DC

## 2023-10-12 MED ORDER — LACTATED RINGERS IV BOLUS
1000.0000 mL | Freq: Once | INTRAVENOUS | Status: AC
  Administered 2023-10-12: 1000 mL via INTRAVENOUS

## 2023-10-12 MED ORDER — ACETAMINOPHEN 325 MG PO TABS
650.0000 mg | ORAL_TABLET | Freq: Once | ORAL | Status: DC | PRN
  Administered 2023-10-12: 650 mg via ORAL
  Filled 2023-10-12: qty 2

## 2023-10-12 MED ORDER — PROCHLORPERAZINE EDISYLATE 10 MG/2ML IJ SOLN
5.0000 mg | Freq: Four times a day (QID) | INTRAMUSCULAR | Status: DC | PRN
  Administered 2023-10-12: 5 mg via INTRAVENOUS
  Filled 2023-10-12 (×2): qty 2

## 2023-10-12 MED ORDER — MORPHINE SULFATE (PF) 4 MG/ML IV SOLN
4.0000 mg | INTRAVENOUS | Status: DC | PRN
  Administered 2023-10-12 – 2023-10-13 (×7): 4 mg via INTRAVENOUS
  Filled 2023-10-12 (×7): qty 1

## 2023-10-12 MED ORDER — MORPHINE SULFATE (PF) 4 MG/ML IV SOLN
4.0000 mg | Freq: Once | INTRAVENOUS | Status: AC
  Administered 2023-10-12: 4 mg via INTRAVENOUS
  Filled 2023-10-12: qty 1

## 2023-10-12 MED ORDER — LACTATED RINGERS IV BOLUS
500.0000 mL | Freq: Once | INTRAVENOUS | Status: AC
  Administered 2023-10-12: 500 mL via INTRAVENOUS

## 2023-10-12 MED ORDER — IBUPROFEN 200 MG PO TABS
400.0000 mg | ORAL_TABLET | Freq: Once | ORAL | Status: DC | PRN
  Administered 2023-10-12 – 2023-10-15 (×3): 400 mg via ORAL
  Filled 2023-10-12 (×3): qty 2
  Filled 2023-10-12: qty 1

## 2023-10-12 MED ORDER — ACETAMINOPHEN 650 MG RE SUPP: 650.0000 mg | Freq: Four times a day (QID) | RECTAL | Status: DC | PRN

## 2023-10-12 MED ORDER — ACETAMINOPHEN 325 MG PO TABS
650.0000 mg | ORAL_TABLET | Freq: Four times a day (QID) | ORAL | Status: DC | PRN
  Administered 2023-10-13 – 2023-10-21 (×11): 650 mg via ORAL
  Filled 2023-10-12 (×12): qty 2

## 2023-10-12 MED ORDER — INSULIN GLARGINE-YFGN 100 UNIT/ML ~~LOC~~ SOLN
10.0000 [IU] | Freq: Once | SUBCUTANEOUS | Status: AC
  Administered 2023-10-12: 10 [IU] via SUBCUTANEOUS
  Filled 2023-10-12: qty 0.1

## 2023-10-12 MED ORDER — PNEUMOCOCCAL 20-VAL CONJ VACC 0.5 ML IM SUSY
0.5000 mL | PREFILLED_SYRINGE | INTRAMUSCULAR | Status: AC
  Administered 2023-10-13: 0.5 mL via INTRAMUSCULAR
  Filled 2023-10-12: qty 0.5

## 2023-10-12 MED ORDER — INSULIN ASPART 100 UNIT/ML IJ SOLN
0.0000 [IU] | Freq: Three times a day (TID) | INTRAMUSCULAR | Status: DC
Start: 2023-10-12 — End: 2023-10-12

## 2023-10-12 MED ORDER — POTASSIUM CHLORIDE CRYS ER 20 MEQ PO TBCR
40.0000 meq | EXTENDED_RELEASE_TABLET | Freq: Three times a day (TID) | ORAL | Status: AC
  Administered 2023-10-12 (×2): 40 meq via ORAL
  Filled 2023-10-12 (×2): qty 2

## 2023-10-12 MED ORDER — INSULIN ASPART 100 UNIT/ML IJ SOLN
0.0000 [IU] | Freq: Three times a day (TID) | INTRAMUSCULAR | Status: DC
Start: 2023-10-12 — End: 2023-10-12
  Administered 2023-10-12: 3 [IU] via SUBCUTANEOUS
  Administered 2023-10-12: 8 [IU] via SUBCUTANEOUS

## 2023-10-12 MED ORDER — METOPROLOL TARTRATE 5 MG/5ML IV SOLN
5.0000 mg | Freq: Once | INTRAVENOUS | Status: AC
  Administered 2023-10-12: 5 mg via INTRAVENOUS
  Filled 2023-10-12: qty 5

## 2023-10-12 MED ORDER — MAGNESIUM SULFATE 2 GM/50ML IV SOLN
2.0000 g | Freq: Once | INTRAVENOUS | Status: AC
Start: 2023-10-12 — End: 2023-10-12
  Administered 2023-10-12: 2 g via INTRAVENOUS
  Filled 2023-10-12: qty 50

## 2023-10-12 MED ORDER — ENOXAPARIN SODIUM 60 MG/0.6ML IJ SOSY
60.0000 mg | PREFILLED_SYRINGE | INTRAMUSCULAR | Status: DC
  Administered 2023-10-12 – 2023-10-17 (×6): 60 mg via SUBCUTANEOUS
  Filled 2023-10-12 (×6): qty 0.6

## 2023-10-12 MED ORDER — ONDANSETRON HCL 4 MG/2ML IJ SOLN
4.0000 mg | Freq: Four times a day (QID) | INTRAMUSCULAR | Status: DC | PRN
  Administered 2023-10-12 – 2023-10-20 (×5): 4 mg via INTRAVENOUS
  Filled 2023-10-12 (×5): qty 2

## 2023-10-12 MED ORDER — K PHOS MONO-SOD PHOS DI & MONO 155-852-130 MG PO TABS
500.0000 mg | ORAL_TABLET | Freq: Three times a day (TID) | ORAL | Status: AC
  Administered 2023-10-12 – 2023-10-13 (×4): 500 mg via ORAL
  Filled 2023-10-12 (×4): qty 2

## 2023-10-12 MED ORDER — CARVEDILOL 12.5 MG PO TABS
12.5000 mg | ORAL_TABLET | Freq: Two times a day (BID) | ORAL | Status: DC
  Administered 2023-10-12: 12.5 mg via ORAL
  Filled 2023-10-12: qty 1

## 2023-10-12 NOTE — ED Notes (Signed)
Notified both provider Dr. Preston Fleeting and primary RN of pt's critical Lactic result.

## 2023-10-12 NOTE — H&P (Addendum)
History and Physical    Patient: Carolyn Zimmerman VHQ:[id number] DOB: 01-17-1986 DOA: 10/11/2023 DOS: the patient was seen and examined on 10/12/2023 PCP: Ivonne Andrew, NP  Patient coming from: Home  Chief Complaint:  Chief Complaint  Patient presents with   Nausea   HPI: Carolyn Zimmerman is a 38 y.o. female with medical history significant of anxiety, depression, coronavirus infection, left MCA watershed CVA, hyperlipidemia, hypertension, hypertensive heart disease, chronic combined systolic heart failure with EF of 40 to 45% in June 2024, jaundice, class III obesity, tobacco use, history of acute pancreatitis, insulin-dependent type 2 diabetes,  tubo-ovarian abscess, hidradenitis suppurativa, chest wall abscesses, history of sepsis secondary to necrotizing fasciitis due to panniculitis with abdominal wall abscess requiring panniculectomy who presented to emergency department with nausea, vomiting, chills, fever, malaise and RLE tenderness with erythema/edema.  She has not been compliant with her insulin as she lost her insurance.  She is now taking any of her oral medications.  He denied fever, chills, rhinorrhea, sore throat, wheezing or hemoptysis.  No chest pain, palpitations, diaphoresis, PND, orthopnea or pitting edema of the lower extremities.  No abdominal pain, nausea, emesis, diarrhea, constipation, melena or hematochezia.  No flank pain, dysuria, frequency or hematuria.  No polyuria, polydipsia, polyphagia or blurred vision.   ED course: Initial vital signs were temperature 101.4 F, pulse 110, respiration 19, BP 164/110 mmHg and O2 sat 100% on room air.  The patient received acetaminophen 650 mg p.o. x 1, ibuprofen 400 mg p.o. x 1, regular insulin 8 units x 1, regular insulin 3 units x 1, 4500 mL of LR bolus followed by LR 150 mL/h for 20 hours, cefepime 2 g IVPB, metronidazole 500 mg IVPB, ondansetron 4 mg ODT p.o. x 1, ondansetron 4 mg IVP x 1, vancomycin 2000 mg IVPB and morphine 4 mg IVP x  2.  Lab work: Urine analysis was hazy with a specific gravity of 1.035, glucose greater than 1000 and protein greater than 300 mg/dL with moderate blood.  The rest of the urinalysis results were normal.  Coronavirus, influenza and RSV PCR was negative.  Group A strep by PCR not detected. Serum pregnancy test was negative.  CBC showed a white count of 38.4, hemoglobin 11.3 g/dL platelets 413.  Lactic acid was 2.5 then 2.6 then 2.9 and then 1.9 mmol/L.  CMP showed normal electrolytes after sodium and calcium correction.  Glucose 374, BUN 11 and creatinine 1.19 mg/dL.  Total protein 5.7 and albumin 2.8 g/dL, the rest of the LFTs were normal.  Imaging: 2 view chest radiograph showing cardiomegaly and pulmonary vascular congestion.  Right tibia and fibula with no acute osseous abnormality.  CT right fibula without contrast showing subcutaneous edema, but no gas or abscess.  There is bilateral inguinal lymphadenopathy with perilymphatic fat stranding about the right inguinal lymph nodes, which are likely reactive   Review of Systems: As mentioned in the history of present illness. All other systems reviewed and are negative. Past Medical History:  Diagnosis Date   Abscess of chest wall 06/15/2017   Coronavirus infection 05/02/2020   CVA (cerebral vascular accident) (HCC) 07/14/2022   L MCA CVA, watershed infarct   Diabetes mellitus    Heart failure with mildly reduced ejection fraction (HFmrEF) (HCC) 09/29/2021   EF 45%, moderate LVH   Hyperlipidemia associated with type 2 diabetes mellitus (HCC)    Hypertension    Jaundice    Obesity    TOA (tubo-ovarian abscess) 09/2019   Past Surgical History:  Procedure Laterality Date   CHOLECYSTECTOMY     IRRIGATION AND DEBRIDEMENT ABSCESS N/A 05/02/2020   Procedure: IRRIGATION AND DEBRIDEMENT ABDOMINAL and CHEST WALL NECROTIZING FASCITITS;  Surgeon: Karie Soda, MD;  Location: [facility];  Service: General;  Laterality: N/A;   liver stent     Social  History:  reports that she has been smoking cigarettes. She has never used smokeless tobacco. She reports that she does not currently use alcohol. She reports that she does not use drugs.  Allergies  Allergen Reactions   Contrast Media [Iodinated Contrast Media] Hives and Swelling    Family History  Problem Relation Age of Onset   Hypertension Mother    Diabetes Father    Hypertension Father     Prior to Admission medications   Medication Sig Start Date End Date Taking? Authorizing Provider  acetaminophen (TYLENOL) 500 MG tablet Take 2 tablets (1,000 mg total) by mouth every 8 (eight) hours as needed for mild pain. 06/02/20   Kallie Locks, FNP  albuterol (VENTOLIN HFA) 108 (90 Base) MCG/ACT inhaler Inhale 2 puffs into the lungs every 4 (four) hours as needed for wheezing or shortness of breath. 06/02/20   Kallie Locks, FNP  amLODipine (NORVASC) 10 MG tablet Take 1 tablet (10 mg total) by mouth daily. 10/26/22   Ivonne Andrew, NP  amoxicillin-clavulanate (AUGMENTIN) 875-125 MG tablet Take 1 tablet by mouth 2 (two) times daily. Patient not taking: Reported on 05/31/2023 03/29/23   Ivonne Andrew, NP  aspirin 81 MG chewable tablet Chew 1 tablet (81 mg total) by mouth daily. 10/26/22   Ivonne Andrew, NP  benzonatate (TESSALON) 100 MG capsule Take 1 capsule (100 mg total) by mouth every 8 (eight) hours. 10/04/23   Geoffery Lyons, MD  blood glucose meter kit and supplies Dispense based on patient and insurance preference. Use up to four times daily as directed. (FOR ICD-9 250.00, 250.01). 06/02/20   Kallie Locks, FNP  carvedilol (COREG) 25 MG tablet Take 1 tablet (25 mg total) by mouth 2 (two) times daily. 10/26/22   Ivonne Andrew, NP  clindamycin (CLINDAGEL) 1 % gel Apply topically 2 (two) times daily. Patient taking differently: Apply topically as needed. 03/29/23   Ivonne Andrew, NP  Continuous Glucose Sensor (DEXCOM G7 SENSOR) MISC use to monitor blood glucose 03/29/23    Motwani, Carin Hock, MD  cyclobenzaprine (FLEXERIL) 10 MG tablet Take 1 tablet (10 mg total) by mouth 2 (two) times daily as needed for up to 15 doses for muscle spasms. 01/30/23   Terald Sleeper, MD  doxycycline (VIBRA-TABS) 100 MG tablet Take 1 tablet (100 mg total) by mouth 2 (two) times daily. 10/04/23   Geoffery Lyons, MD  furosemide (LASIX) 20 MG tablet Take 2 tablets (40 mg total) by mouth in the morning AND 1 tablet (20 mg total) Nightly. Patient taking differently: Take 2 tablets (40 mg total) by mouth in the morning 01/22/23   Tobb, Kardie, DO  hydrochlorothiazide (MICROZIDE) 12.5 MG capsule Take 2 capsules (25 mg total) by mouth daily. 05/31/23   Tobb, Kardie, DO  hydrOXYzine (ATARAX) 10 MG tablet Take 1 tablet (10 mg total) by mouth 3 (three) times daily as needed for anxiety. 04/30/23   Ivonne Andrew, NP  ibuprofen (ADVIL) 600 MG tablet Take 1 tablet (600 mg total) by mouth every 8 (eight) hours as needed for up to 21 doses. 01/30/23   Terald Sleeper, MD  Insulin Glargine Solostar (LANTUS) 100 UNIT/ML  Solostar Pen Inject 25 Units into the skin daily. 01/31/23   Ivonne Andrew, NP  insulin lispro (HUMALOG) 100 UNIT/ML KwikPen Inject 10 Units into the skin 3 (three) times daily. 01/31/23   Ivonne Andrew, NP  metFORMIN (GLUCOPHAGE-XR) 500 MG 24 hr tablet Take 2 tablets (1,000 mg total) by mouth in the morning and at bedtime. 10/26/22   Ivonne Andrew, NP  nicotine (NICODERM CQ - DOSED IN MG/24 HOURS) 14 mg/24hr patch Place 1 patch (14 mg total) onto the skin daily. Patient not taking: Reported on 05/31/2023 03/29/23   Ivonne Andrew, NP  rosuvastatin (CRESTOR) 20 MG tablet Take 1 tablet (20 mg total) by mouth daily. 10/26/22   Ivonne Andrew, NP  spironolactone (ALDACTONE) 50 MG tablet Take 1 tablet (50 mg total) by mouth daily. 11/14/22   Flossie Dibble, NP  torsemide (DEMADEX) 20 MG tablet Take 2 tablets (40 mg total) by mouth daily. 02/22/23     valsartan (DIOVAN) 320 MG tablet Take 1  tablet (320 mg total) by mouth daily. 10/26/22   Ivonne Andrew, NP    Physical Exam: Vitals:   10/12/23 1040 10/12/23 1045 10/12/23 1130 10/12/23 1153  BP:  133/84 135/76   Pulse:  94 94   Resp:  20 13   Temp: (!) 101.8 F (38.8 C)   99.6 F (37.6 C)  TempSrc: Oral   Oral  SpO2:  95% 96%   Weight:      Height:       Physical Exam Vitals and nursing note reviewed.  Constitutional:      General: She is awake. She is not in acute distress.    Appearance: Normal appearance. She is morbidly obese. She is ill-appearing. She is not diaphoretic.  HENT:     Head: Normocephalic.     Nose: No rhinorrhea.     Mouth/Throat:     Mouth: Mucous membranes are moist.  Eyes:     General: No scleral icterus.    Pupils: Pupils are equal, round, and reactive to light.  Neck:     Vascular: No JVD.  Cardiovascular:     Rate and Rhythm: Normal rate and regular rhythm.     Heart sounds: S1 normal and S2 normal.  Pulmonary:     Effort: Pulmonary effort is normal.     Breath sounds: Normal breath sounds. No wheezing, rhonchi or rales.  Abdominal:     General: Bowel sounds are normal. There is no distension.     Palpations: Abdomen is soft.     Tenderness: There is no abdominal tenderness. There is no guarding.  Musculoskeletal:     Cervical back: Neck supple.     Right knee: Decreased range of motion.     Right lower leg: Swelling and tenderness present. 1+ Edema present.     Left lower leg: 1+ Edema present.  Skin:    General: Skin is warm and dry.     Findings: Erythema present. No abscess or wound.  Neurological:     General: No focal deficit present.     Mental Status: She is alert and oriented to person, place, and time.  Psychiatric:        Mood and Affect: Mood normal.        Behavior: Behavior normal. Behavior is cooperative.     Data Reviewed:  Results are pending, will review when available.  03/09/2023 echocardiogram report. IMPRESSIONS:   1. Left ventricular  ejection fraction, by estimation,  is 40 to 45%. The  left ventricle has mildly decreased function. The left ventricle  demonstrates global hypokinesis. The left ventricular internal cavity size  was moderately dilated. There is moderate   eccentric left ventricular hypertrophy. Left ventricular diastolic  parameters are consistent with Grade II diastolic dysfunction  (pseudonormalization).   2. Right ventricular systolic function is normal. The right ventricular  size is normal. Tricuspid regurgitation signal is inadequate for assessing  PA pressure.   3. Left atrial size was severely dilated.   4. The mitral valve is normal in structure. No evidence of mitral valve  regurgitation.   5. The aortic valve is tricuspid. Aortic valve regurgitation is not  visualized. No aortic stenosis is present.   6. The inferior vena cava is normal in size with greater than 50%  respiratory variability, suggesting right atrial pressure of 3 mmHg.    Assessment and Plan: Principal Problem:   Sepsis (HCC) Secondary to:   Cellulitis of right lower extremity Admit to telemetry/inpatient. Continue ceftriaxone 2 g IVPB daily. Analgesics as needed. Antiemetics as needed. Follow-up blood culture and sensitivity. Follow-up CBC and chemistry in the morning.  Active Problems:   Hypomagnesemia Magnesium sulfate 2 g IVPB x 1 dose.    Hypophosphatemia Replacing K-Phos. Recheck as needed.    Hypokalemia Replacing. Follow level tomorrow morning.    Hypertension  Resume antihypertensives pending med reconciliation.    Hx of medication noncompliance Briefly advised on risks of further cardiovascular complications. Will refer [facility]. Asked to check if she qualifies for [facility] Medicaid.    Uncontrolled diabetes mellitus with hyperglycemia (HCC) Carbohydrate modified diet. Semglee 10 units SQ x 1 dose today. Resume Semglee 25 units SQ daily tomorrow morning. CBG monitoring with RI SS. Check  hemoglobin A1c.    Obesity, Class III, BMI 40-49.9 (morbid obesity) (HCC) Current BMI 43.54 kg/m. Lifestyle modifications. Follow-up with closely PCP and/or bariatric clinic.    Mixed hyperlipidemia No longer on rosuvastatin. Needs to decrease health insurance coverage. Follow-up closely with PCP.    Anxiety   Depression No SI or HI. No longer on meds. Follow-up with PCP or behavioral health as an outpatient.    History of CVA (cerebrovascular accident) Supportive care.    Tobacco abuse Tobacco cessation advised. Nicotine replacement therapy ordered.    Protein-calorie malnutrition, severe (HCC) In the setting of anemia/chronic illness/obesity May benefit from protein supplementation. Consider nutritional services evaluation. Follow-up albumin level.    Advance Care Planning:   Code Status: Full Code   Consults:   Family Communication:   Severity of Illness: The appropriate patient status for this patient is INPATIENT. Inpatient status is judged to be reasonable and necessary in order to provide the required intensity of service to ensure the patient's safety. The patient's presenting symptoms, physical exam findings, and initial radiographic and laboratory data in the context of their chronic comorbidities is felt to place them at high risk for further clinical deterioration. Furthermore, it is not anticipated that the patient will be medically stable for discharge from the hospital within 2 midnights of admission.   * I certify that at the point of admission it is my clinical judgment that the patient will require inpatient hospital care spanning beyond 2 midnights from the point of admission due to high intensity of service, high risk for further deterioration and high frequency of surveillance required.*  Author: Bobette Mo, MD 10/12/2023 1:14 PM  For on call review [email or web address].   This document was  prepared using Dragon voice recognition software and may  contain some unintended transcription errors.

## 2023-10-12 NOTE — Progress Notes (Signed)
Hospitalist Transfer Note:    Nursing staff, Please call [facility] Admits & Consults System-Wide number on Amion [phone]) as soon as patient's arrival, so appropriate admitting provider can evaluate the pt.   Transferring facility: [facility] Requesting provider: Dr. Preston Fleeting (EDP at [facility]) Reason for transfer: admission for further evaluation and management of right lower extremity cellulitis complicated by severe sepsis.    38 year old female with history of type 2 diabetes mellitus, chronic systolic/diastolic heart failure, with most recent echocardiogram in June 2024 showing LVEF 40 to 45% along with grade 2 diastolic dysfunction, who presented to [facility] ED complaining of progressive erythema of the right lower extremity associated with discomfort, swelling, along with recent development of generalized body aches and fever.   Vital signs in the ED were notable for the following: Temperature max 101.4; heart rates in the 90s to low 100s; systolic blood pressures in the 130s to 160s.  Labs were notable for CBC reflects platelets of count of 38,000 with 88% neutrophils compared to white blood cell count of 12,700 one week ago.  Initial lactate 2.5, with the patient subsequently receiving 1 L of IV fluid, with repeat lactate trending up slightly to 2.6.  The patient is now receiving additional IV fluid.  Imaging notable for CT of the right lower extremity showed subcutaneous edema consistent with cellulitis, while showing no evidence of subcutaneous gas to suggest necrotizing fasciitis.   Blood cultures x 2 were collected prior to initiation of broad-spectrum IV antibiotics in the form of IV vancomycin, cefepime, and Flagyl, which were started prior to identification of source of infection being the right lower extremity cellulitis.  Subsequently, I accepted this patient for transfer for inpatient admission to a med/tele bed at [facility] or [facility]  (first available) for further work-up and management of the above.        Carolyn Pigg, DO Hospitalist

## 2023-10-12 NOTE — ED Provider Notes (Addendum)
Care assumed from [facility] and Dr. Rubin Payor, patient with marked leukocytosis and concern for possible necrotizing fasciitis pending comprehensive metabolic panel and CT of her leg.  She will need to be admitted.  Following 1 L of fluids, lactic acid is essentially unchanged.  I have ordered additional IV fluids to bring her up to 30 mL/kg of fluids.  I have reviewed her comprehensive metabolic panel and my interpretation is mild hyponatremia which is not felt to be clinically significant creatinine is slightly elevated compared with baseline but not sufficiently elevated to warrant designation of acute kidney injury, hypoalbuminemia which had been present previously.  CT scans of her right upper and lower leg show presence of edema and possible cellulitis but no gas to suggest necrotizing fasciitis.  I have discussed case with Dr. Arlean Hopping of [facility], who agrees to admit the patient.  CRITICAL CARE Performed by: Dione Booze Total critical care time: 35 minutes Critical care time was exclusive of separately billable procedures and treating other patients. Critical care was necessary to treat or prevent imminent or life-threatening deterioration. Critical care was time spent personally by me on the following activities: development of treatment plan with patient and/or surrogate as well as nursing, discussions with consultants, evaluation of patient's response to treatment, examination of patient, obtaining history from patient or surrogate, ordering and performing treatments and interventions, ordering and review of laboratory studies, ordering and review of radiographic studies, pulse oximetry and re-evaluation of patient's condition.   Dione Booze, MD 10/12/23 0217  Repeat lactic acid level actually rose.  I ordered additional IV fluids, and repeat lactic acid level following that was 1.9.  She is currently awaiting inpatient bed for transfer.    Dione Booze, MD 10/12/23  [phone]

## 2023-10-12 NOTE — Progress Notes (Signed)
   10/12/23 1511  TOC Brief Assessment  Insurance and Status Lapsed  Patient has primary care physician Yes  Home environment has been reviewed Apartment  Prior level of function: Independent  Prior/Current Home Services No current home services  Social Drivers of Health Review SDOH reviewed no interventions necessary  Readmission risk has been reviewed Yes  Transition of care needs transition of care needs identified, TOC will continue to follow    Met with pt to review SDOH concerns. Pt shares she is a single mother. She lost her job in October and struggled to pay her bills and afford food. She recently started a new job however, took a pay cut and continues to struggle to afford paying her bills. Pt recently was approved for SNAP benefits which, she reports has helped her a lot however, with have two adolescent children it does not always cover their needs. Pt agreeable to resources being placed on discharge paperwork and for referrals to be made in [facility]. Resources and referrals have been completed.  Pt reports not taking medications as she has been unable to afford them. CSW discussed community pharmacy. Pt also agreeable to use MATCH program at discharge and is aware program can only be used 1x/51mos.    SDOH Interventions Today    Flowsheet Row Most Recent Value  SDOH Interventions   Food Insecurity Interventions Inpatient TOC, [facility]ARE[facility] Referral, Other (Comment)  [Resource added to AVS]  Housing Interventions Inpatient TOC, Other (Comment)  [Resource added to AVS]

## 2023-10-12 NOTE — Discharge Instructions (Signed)
FOOD PANTRY [employer] [address] [phone]  [facility] [employer] [address] [place] [phone]  [facility] Food Distribution Center [address] [place] [phone]  [facility] [address] [phone]  [facility] - Food Distribution Center [address] [place], [place] [address] [phone]  UTILITIES [facility] [address] [place] [phone] Rental assistance/rental hotline: [phone] ext. 340.    Utility assistance/utility hotline: [phone] ext. [address] Department of IT consultant (heating/cooling and water assistance) [phone] (rental and utility assistance) [phone]  [employer] - call 211

## 2023-10-12 NOTE — Plan of Care (Signed)
  Problem: Education: Goal: Ability to describe self-care measures that may prevent or decrease complications (Diabetes Survival Skills Education) will improve Outcome: Progressing Goal: Individualized Educational Video(s) Outcome: Progressing   Problem: Coping: Goal: Ability to adjust to condition or change in health will improve Outcome: Progressing   Problem: Fluid Volume: Goal: Ability to maintain a balanced intake and output will improve Outcome: Progressing   Problem: Health Behavior/Discharge Planning: Goal: Ability to identify and utilize available resources and services will improve Outcome: Progressing Goal: Ability to manage health-related needs will improve Outcome: Progressing   Problem: Metabolic: Goal: Ability to maintain appropriate glucose levels will improve Outcome: Progressing   Problem: Skin Integrity: Goal: Risk for impaired skin integrity will decrease Outcome: Progressing   Problem: Tissue Perfusion: Goal: Adequacy of tissue perfusion will improve Outcome: Progressing

## 2023-10-12 NOTE — Progress Notes (Signed)
[facility] admitting physician addendum:  The patient was seen due to feeling anxious, increased pain intensity in her RLE, tachypnea and tachycardia.  Advised to breathe deeply and slowly.  Metoprolol 5 mg p.o. x 1 dose and morphine 4 mg IVP x 1 dose ordered.  Her carvedilol will be resumed, but at half of the last dose she was taking as she has not been compliant taking it regularly.  Sanda Klein, MD.

## 2023-10-12 NOTE — ED Notes (Signed)
Provider Dr. Preston Fleeting and primary RN informed of critical Lactic Acid lab value.  Provider to place orders.

## 2023-10-13 ENCOUNTER — Inpatient Hospital Stay (HOSPITAL_COMMUNITY): Payer: Medicaid Other

## 2023-10-13 DIAGNOSIS — A419 Sepsis, unspecified organism: Secondary | ICD-10-CM

## 2023-10-13 DIAGNOSIS — M79661 Pain in right lower leg: Secondary | ICD-10-CM

## 2023-10-13 DIAGNOSIS — D72829 Elevated white blood cell count, unspecified: Secondary | ICD-10-CM

## 2023-10-13 LAB — CBC WITH DIFFERENTIAL/PLATELET
Abs Immature Granulocytes: 0.29 10*3/uL — ABNORMAL HIGH (ref 0.00–0.07)
Basophils Absolute: 0.1 10*3/uL (ref 0.0–0.1)
Basophils Relative: 0 %
Eosinophils Absolute: 0 10*3/uL (ref 0.0–0.5)
Eosinophils Relative: 0 %
HCT: 34.5 % — ABNORMAL LOW (ref 36.0–46.0)
Hemoglobin: 10.7 g/dL — ABNORMAL LOW (ref 12.0–15.0)
Immature Granulocytes: 1 %
Lymphocytes Relative: 10 %
Lymphs Abs: 2.5 10*3/uL (ref 0.7–4.0)
MCH: 25.6 pg — ABNORMAL LOW (ref 26.0–34.0)
MCHC: 31 g/dL (ref 30.0–36.0)
MCV: 82.5 fL (ref 80.0–100.0)
Monocytes Absolute: 1.2 10*3/uL — ABNORMAL HIGH (ref 0.1–1.0)
Monocytes Relative: 5 %
Neutro Abs: 20.6 10*3/uL — ABNORMAL HIGH (ref 1.7–7.7)
Neutrophils Relative %: 84 %
Platelets: 297 10*3/uL (ref 150–400)
RBC: 4.18 MIL/uL (ref 3.87–5.11)
RDW: 14.8 % (ref 11.5–15.5)
WBC: 24.6 10*3/uL — ABNORMAL HIGH (ref 4.0–10.5)
nRBC: 0 % (ref 0.0–0.2)

## 2023-10-13 LAB — MAGNESIUM: Magnesium: 2.2 mg/dL (ref 1.7–2.4)

## 2023-10-13 LAB — COMPREHENSIVE METABOLIC PANEL
ALT: 18 U/L (ref 0–44)
AST: 18 U/L (ref 15–41)
Albumin: 2.1 g/dL — ABNORMAL LOW (ref 3.5–5.0)
Alkaline Phosphatase: 82 U/L (ref 38–126)
Anion gap: 9 (ref 5–15)
BUN: 11 mg/dL (ref 6–20)
CO2: 23 mmol/L (ref 22–32)
Calcium: 7.7 mg/dL — ABNORMAL LOW (ref 8.9–10.3)
Chloride: 102 mmol/L (ref 98–111)
Creatinine, Ser: 0.86 mg/dL (ref 0.44–1.00)
GFR, Estimated: >60 mL/min (ref >60)
Glucose, Bld: 225 mg/dL — ABNORMAL HIGH (ref 70–99)
Potassium: 4 mmol/L (ref 3.5–5.1)
Sodium: 134 mmol/L — ABNORMAL LOW (ref 135–145)
Total Bilirubin: 0.5 mg/dL (ref 0.0–1.2)
Total Protein: 6.4 g/dL — ABNORMAL LOW (ref 6.5–8.1)

## 2023-10-13 LAB — GLUCOSE, CAPILLARY
Glucose-Capillary: 203 mg/dL — ABNORMAL HIGH (ref 70–99)
Glucose-Capillary: 186 mg/dL — ABNORMAL HIGH (ref 70–99)
Glucose-Capillary: 203 mg/dL — ABNORMAL HIGH (ref 70–99)
Glucose-Capillary: 163 mg/dL — ABNORMAL HIGH (ref 70–99)

## 2023-10-13 LAB — HIV ANTIBODY (ROUTINE TESTING W REFLEX): HIV Screen 4th Generation wRfx: NONREACTIVE

## 2023-10-13 MED ORDER — CARVEDILOL 25 MG PO TABS
25.0000 mg | ORAL_TABLET | Freq: Two times a day (BID) | ORAL | Status: DC
  Administered 2023-10-13 – 2023-10-16 (×7): 25 mg via ORAL
  Filled 2023-10-13 (×7): qty 1

## 2023-10-13 MED ORDER — HYDROMORPHONE HCL 1 MG/ML IJ SOLN
0.5000 mg | Freq: Four times a day (QID) | INTRAMUSCULAR | Status: DC | PRN
  Administered 2023-10-13 – 2023-10-15 (×8): 0.5 mg via INTRAVENOUS
  Filled 2023-10-13 (×8): qty 0.5

## 2023-10-13 MED ORDER — METFORMIN HCL ER 500 MG PO TB24
1000.0000 mg | ORAL_TABLET | Freq: Two times a day (BID) | ORAL | Status: DC
Start: 2023-10-13 — End: 2023-10-21
  Administered 2023-10-13 – 2023-10-21 (×17): 1000 mg via ORAL
  Filled 2023-10-13 (×17): qty 2

## 2023-10-13 MED ORDER — TORSEMIDE 20 MG PO TABS
40.0000 mg | ORAL_TABLET | Freq: Every day | ORAL | Status: DC
  Administered 2023-10-13 – 2023-10-21 (×9): 40 mg via ORAL
  Filled 2023-10-13 (×9): qty 2

## 2023-10-13 MED ORDER — INSULIN GLARGINE-YFGN 100 UNIT/ML ~~LOC~~ SOLN
5.0000 [IU] | Freq: Once | SUBCUTANEOUS | Status: AC
  Administered 2023-10-13: 5 [IU] via SUBCUTANEOUS
  Filled 2023-10-13: qty 0.05

## 2023-10-13 MED ORDER — IRBESARTAN 150 MG PO TABS
300.0000 mg | ORAL_TABLET | Freq: Every day | ORAL | Status: DC
  Administered 2023-10-13 – 2023-10-21 (×9): 300 mg via ORAL
  Filled 2023-10-13 (×9): qty 2

## 2023-10-13 MED ORDER — ROSUVASTATIN CALCIUM 10 MG PO TABS
20.0000 mg | ORAL_TABLET | Freq: Every day | ORAL | Status: DC
  Administered 2023-10-13 – 2023-10-21 (×9): 20 mg via ORAL
  Filled 2023-10-13 (×9): qty 2

## 2023-10-13 MED ORDER — ASPIRIN 81 MG PO CHEW
81.0000 mg | CHEWABLE_TABLET | Freq: Every day | ORAL | Status: DC
Start: 2023-10-13 — End: 2023-10-21
  Administered 2023-10-13 – 2023-10-21 (×9): 81 mg via ORAL
  Filled 2023-10-13 (×9): qty 1

## 2023-10-13 MED ORDER — INSULIN GLARGINE-YFGN 100 UNIT/ML ~~LOC~~ SOLN
30.0000 [IU] | Freq: Every day | SUBCUTANEOUS | Status: DC
  Administered 2023-10-14 – 2023-10-21 (×8): 30 [IU] via SUBCUTANEOUS
  Filled 2023-10-13 (×8): qty 0.3

## 2023-10-13 NOTE — Progress Notes (Signed)
Right lower extremity venous duplex has been completed. Preliminary results can be found in CV Proc through chart review.   10/13/23 2:23 PM Olen Cordial RVT

## 2023-10-13 NOTE — Plan of Care (Signed)
  Problem: Pain Managment: Goal: General experience of comfort will improve and/or be controlled Outcome: Progressing   Problem: Tissue Perfusion: Goal: Adequacy of tissue perfusion will improve Outcome: Progressing

## 2023-10-13 NOTE — Progress Notes (Signed)
PROGRESS NOTE                                                                                                                                                                                                             Patient Demographics:    Carolyn Zimmerman, is a 38 y.o. female, DOB - 1986/08/08, WUJ:[id number]  Outpatient Primary MD for the patient is Ivonne Andrew, NP    LOS - 1  Admit date - 10/11/2023    Chief Complaint  Patient presents with   Nausea       Brief Narrative (HPI from H&P)   Carolyn Zimmerman is a 38 y.o. female with medical history significant of anxiety, depression, coronavirus infection, left MCA watershed CVA, hyperlipidemia, hypertension, hypertensive heart disease, chronic combined systolic heart failure with EF of 40 to 45% in June 2024, jaundice, class III obesity, tobacco use, history of acute pancreatitis, insulin-dependent type 2 diabetes,  tubo-ovarian abscess, hidradenitis suppurativa, chest wall abscesses, history of sepsis secondary to necrotizing fasciitis due to panniculitis with abdominal wall abscess requiring panniculectomy who presented to emergency department with nausea, vomiting, chills, fever, malaise and RLE tenderness with erythema/edema and admitted for sepsis secondary to cellulitis.   Subjective:   Carolyn Zimmerman was evaluated at the bed side sleeping comfortably in bed. She reports some swelling in her left night overnight. She also continues to have exquisite pain in her right leg and calf but the erythema has improved.  He denies any chest pain, shortness of breath headaches or dizziness.  She had a fever of 101.2 around 6 AM today. Reports she has not been able to afford her medications and has not taken any medication since November. She is interested in speaking with the social worker about applying for disability.   Assessment  & Plan :    Assessment and Plan: # Sepsis  secondary to cellulitis # RLE cellulitis Erythema of the right lower extremity has improved but she continues to have pain, warmth and edema of the leg.  Spiked a fever of 101.2 this morning. Leukocytosis trending down from 34.4 yesterday to 24.6. Reports that the morphine makes her drowsy. -Continue IV Rocephin -Change pain medication from IV morphine to IV Dilaudid. -Check RLE ultrasound to rule out DVT -Trend CBC, fever curve  # T2DM, uncontrolled A1c of 11.7% on admission in  the setting of not taking any insulin or diabetes medication over the last 3 months. Fasting blood sugar of 203 this morning. -Resume home metformin -Increase Semglee from 25 to 30 units daily -Continue SSI with meals, CBG monitoring  # HTN BP elevated with SBP in the 140s to 180s overnight.  The Coreg was increased back to her normal dose with improvement in SBP to 130s. -Increase Coreg from 12.5 mg BID to 25 mg BID -Start irbesartan 300 mg daily (substitute for home valsartan 320 mg)  # HFrEF Last TTE in June 2024 showed EF 40-45%, LV global hypokinesis, moderate eccentric LVH, G2DD, and severely dilated LA. patient has received 7.5 L of fluid since admission with urine output of Foley 1.5 L.  She is not in respiratory distress but is slightly fluid overloaded.  GDMT previously included losartan, spironolactone, Coreg and torsemide however patient has not taken these medications for the last 3 months. -Telemetry -Resume torsemide 40 mg daily -Continue beta-blocker and ARB as above -Trend and replete electrolytes -Strict I&O's, daily weights  # HLD # Hx CVA -Resume aspirin and rosuvastatin -Check lipid panel  # Tobacco use disorder -Continue nicotine patch  # Hypomagnesemia, resolved # Hypokalemia, resolved # Hypophosphatemia, continue phosphorus tablets, follow-up repeat phosphorus tomorrow  # Social determinants of health Reports financial difficulties and food insecurity.  She has had difficulty  affording her medications over the last 3 months. -TOC consulted, appreciate assistance  Nutrition Problem:       Morbid obesity: Estimated body mass index is 43.54 kg/m as calculated from the following:   Height as of this encounter: 5\' 7"  (1.702 m).   Weight as of this encounter: 126.1 kg.  -Follow-up with PCP for nutrition and weight loss counseling        Condition -improving  Family Communication  : None  Code Status : Full code  Consults  : None  PUD Prophylaxis : None   Procedures  :     None      Disposition Plan  :    Status is: Inpatient Remains inpatient appropriate because: Treatment for cellulitis, RLE swelling and pain  DVT Prophylaxis  :         Lab Results  Component Value Date   PLT 297 10/13/2023    Diet :  Diet Order             Diet heart healthy/carb modified Fluid consistency: Thin; Fluid restriction: 1500 mL Fluid  Diet effective now                    Inpatient Medications  Scheduled Meds:  carvedilol  25 mg Oral BID   enoxaparin (LOVENOX) injection  60 mg Subcutaneous Q24H   insulin aspart  0-20 Units Subcutaneous TID WC   insulin glargine-yfgn  25 Units Subcutaneous Daily   metFORMIN  1,000 mg Oral BID WC   nicotine  21 mg Transdermal Daily   phosphorus  500 mg Oral TID   torsemide  40 mg Oral Daily   Continuous Infusions:  cefTRIAXone (ROCEPHIN)  IV 2 g (10/13/23 1048)   PRN Meds:.acetaminophen **OR** acetaminophen, HYDROmorphone (DILAUDID) injection, ibuprofen, ondansetron **OR** ondansetron (ZOFRAN) IV, prochlorperazine  Antibiotics  :    Anti-infectives (From admission, onward)    Start     Dose/Rate Route Frequency Ordered Stop   10/12/23 0800  cefTRIAXone (ROCEPHIN) 2 g in sodium chloride 0.9 % 100 mL IVPB        2 g 200  mL/hr over 30 Minutes Intravenous Every 24 hours 10/12/23 0754     10/12/23 0030  vancomycin (VANCOCIN) IVPB 1000 mg/200 mL premix       Placed in "Followed by" Linked Group    1,000 mg 200 mL/hr over 60 Minutes Intravenous  Once 10/11/23 2317 10/12/23 0302   10/11/23 2330  vancomycin (VANCOCIN) IVPB 1000 mg/200 mL premix       Placed in "Followed by" Linked Group   1,000 mg 200 mL/hr over 60 Minutes Intravenous  Once 10/11/23 2317 10/12/23 0139   10/11/23 2300  ceFEPIme (MAXIPIME) 2 g in sodium chloride 0.9 % 100 mL IVPB        2 g 200 mL/hr over 30 Minutes Intravenous  Once 10/11/23 2258 10/12/23 0035   10/11/23 2300  metroNIDAZOLE (FLAGYL) IVPB 500 mg        500 mg 100 mL/hr over 60 Minutes Intravenous  Once 10/11/23 2258 10/12/23 0139   10/11/23 2300  vancomycin (VANCOCIN) IVPB 1000 mg/200 mL premix  Status:  Discontinued        1,000 mg 200 mL/hr over 60 Minutes Intravenous  Once 10/11/23 2258 10/11/23 2314         Objective:   Vitals:   10/13/23 0139 10/13/23 0511 10/13/23 0610 10/13/23 1127  BP: (!) 182/85 (!) 171/94 (!) 169/81 137/85  Pulse: 93 92 91 79  Resp: 18 20 16 18   Temp: 99.8 F (37.7 C) 99.8 F (37.7 C) (!) 101.2 F (38.4 C) 98.5 F (36.9 C)  TempSrc: Oral Oral Oral   SpO2: 95% 100% 100% 100%  Weight:      Height:        Wt Readings from Last 3 Encounters:  10/12/23 126.1 kg  10/04/23 126.1 kg  05/31/23 126 kg     Intake/Output Summary (Last 24 hours) at 10/13/2023 1129 Last data filed at 10/13/2023 0600 Gross per 24 hour  Intake 2331.89 ml  Output 1500 ml  Net 831.89 ml     Physical Exam  General: Pleasant, well-appearing morbidly obese middle-age woman sitting in bed. No acute distress. HEENT: [place]/AT. Anicteric sclera CV: RRR. No murmurs, rubs, or gallops.  1+ RLE edema, trace LLE edema Pulmonary: Lungs CTAB. Normal effort. No wheezing or rales. Abdominal: Soft, NT/ND. Normal bowel sounds. Extremities: Palpable radial and DP pulses. Mild swelling to left hand. Skin: Warm and dry. Minimal erythema to the left leg with warmth, tenderness and edema. Neuro: A&Ox3. Moves all extremities. Normal sensation to light  touch. No focal deficit. Psych: Normal mood and affect    RN pressure injury documentation:      Data Review:    Recent Labs  Lab 10/11/23 2231 10/12/23 1338 10/13/23 0652  WBC 38.4* 34.4* 24.6*  HGB 11.3* 10.6* 10.7*  HCT 34.4* 34.0* 34.5*  PLT 336 280 297  MCV 79.1* 83.5 82.5  MCH 26.0 26.0 25.6*  MCHC 32.8 31.2 31.0  RDW 14.4 14.4 14.8  LYMPHSABS 1.2 2.8 2.5  MONOABS 2.3* 1.4* 1.2*  EOSABS 0.0 0.0 0.0  BASOSABS 0.0 0.1 0.1    Recent Labs  Lab 10/11/23 2231 10/11/23 2300 10/12/23 0023 10/12/23 0314 10/12/23 0557 10/12/23 0952 10/12/23 1338 10/13/23 0652  NA  --  130*  --   --   --   --  135 134*  K  --  3.6  --   --   --   --  3.4* 4.0  CL  --  99  --   --   --   --  102 102  CO2  --  23  --   --   --   --  23 23  ANIONGAP  --  8  --   --   --   --  10 9  GLUCOSE  --  374*  --   --   --   --  124* 225*  BUN  --  11  --   --   --   --  11 11  CREATININE  --  1.19*  --   --   --   --  1.01* 0.86  AST  --  16  --   --   --   --  21 18  ALT  --  15  --   --   --   --  17 18  ALKPHOS  --  68  --   --   --   --  59 82  BILITOT  --  0.6  --   --   --   --  0.5 0.5  ALBUMIN  --  2.8*  --   --   --   --  2.0* 2.1*  LATICACIDVEN 2.5*  --  2.6* 2.9* 1.9  --   --   --   HGBA1C  --   --   --   --   --  11.7*  --   --   MG  --   --   --   --   --   --  1.6* 2.2  CALCIUM  --  7.9*  --   --   --   --  7.7* 7.7*      Recent Labs  Lab 10/11/23 2231 10/11/23 2300 10/12/23 0023 10/12/23 0314 10/12/23 0557 10/12/23 0952 10/12/23 1338 10/13/23 0652  LATICACIDVEN 2.5*  --  2.6* 2.9* 1.9  --   --   --   HGBA1C  --   --   --   --   --  11.7*  --   --   MG  --   --   --   --   --   --  1.6* 2.2  CALCIUM  --  7.9*  --   --   --   --  7.7* 7.7*    --------------------------------------------------------------------------------------------------------------- Lab Results  Component Value Date   CHOL 151 03/26/2023   HDL 32.10 (L) 03/26/2023   LDLCALC 100 (H)  12/29/2021   LDLDIRECT 64.0 03/26/2023   TRIG 357.0 (H) 03/26/2023   CHOLHDL 5 03/26/2023    Lab Results  Component Value Date   HGBA1C 11.7 (H) 10/12/2023   No results for input(s): "TSH", "T4TOTAL", "FREET4", "T3FREE", "THYROIDAB" in the last 72 hours. No results for input(s): "VITAMINB12", "FOLATE", "FERRITIN", "TIBC", "IRON", "RETICCTPCT" in the last 72 hours. ------------------------------------------------------------------------------------------------------------------ Cardiac Enzymes No results for input(s): "CKMB", "TROPONINI", "MYOGLOBIN" in the last 168 hours.  Invalid input(s): "CK"  Micro Results Recent Results (from the past 240 hours)  Resp panel by RT-PCR (RSV, Flu A&B, Covid) Anterior Nasal Swab     Status: None   Collection Time: 10/04/23  2:36 AM   Specimen: Anterior Nasal Swab  Result Value Ref Range Status   SARS Coronavirus 2 by RT PCR NEGATIVE NEGATIVE Final    Comment: (NOTE) SARS-CoV-2 target nucleic acids are NOT DETECTED.  The SARS-CoV-2 RNA is generally detectable in upper respiratory specimens during the acute phase of infection. The lowest concentration of SARS-CoV-2 viral copies this  assay can detect is 138 copies/mL. A negative result does not preclude SARS-Cov-2 infection and should not be used as the sole basis for treatment or other patient management decisions. A negative result may occur with  improper specimen collection/handling, submission of specimen other than nasopharyngeal swab, presence of viral mutation(s) within the areas targeted by this assay, and inadequate number of viral copies(<138 copies/mL). A negative result must be combined with clinical observations, patient history, and epidemiological information. The expected result is Negative.  Fact Sheet for Patients:  BloggerCourse.com  Fact Sheet for Healthcare Providers:  SeriousBroker.it  This test is no t yet approved  or cleared by the [place] FDA and  has been authorized for detection and/or diagnosis of SARS-CoV-2 by FDA under an Emergency Use Authorization (EUA). This EUA will remain  in effect (meaning this test can be used) for the duration of the COVID-19 declaration under Section 564(b)(1) of the Act, 21 U.S.C.section 360bbb-3(b)(1), unless the authorization is terminated  or revoked sooner.       Influenza A by PCR NEGATIVE NEGATIVE Final   Influenza B by PCR NEGATIVE NEGATIVE Final    Comment: (NOTE) The Xpert Xpress SARS-CoV-2/FLU/RSV plus assay is intended as an aid in the diagnosis of influenza from Nasopharyngeal swab specimens and should not be used as a sole basis for treatment. Nasal washings and aspirates are unacceptable for Xpert Xpress SARS-CoV-2/FLU/RSV testing.  Fact Sheet for Patients: BloggerCourse.com  Fact Sheet for Healthcare Providers: SeriousBroker.it  This test is not yet approved or cleared by the [place] FDA and has been authorized for detection and/or diagnosis of SARS-CoV-2 by FDA under an Emergency Use Authorization (EUA). This EUA will remain in effect (meaning this test can be used) for the duration of the COVID-19 declaration under Section 564(b)(1) of the Act, 21 U.S.C. section 360bbb-3(b)(1), unless the authorization is terminated or revoked.     Resp Syncytial Virus by PCR NEGATIVE NEGATIVE Final    Comment: (NOTE) Fact Sheet for Patients: BloggerCourse.com  Fact Sheet for Healthcare Providers: SeriousBroker.it  This test is not yet approved or cleared by the [place] FDA and has been authorized for detection and/or diagnosis of SARS-CoV-2 by FDA under an Emergency Use Authorization (EUA). This EUA will remain in effect (meaning this test can be used) for the duration of the COVID-19 declaration under Section 564(b)(1) of the Act,  21 U.S.C. section 360bbb-3(b)(1), unless the authorization is terminated or revoked.  Performed at [facility], [address], [place], [place] [address]   Resp panel by RT-PCR (RSV, Flu A&B, Covid) Anterior Nasal Swab     Status: None   Collection Time: 10/11/23  4:58 PM   Specimen: Anterior Nasal Swab  Result Value Ref Range Status   SARS Coronavirus 2 by RT PCR NEGATIVE NEGATIVE Final    Comment: (NOTE) SARS-CoV-2 target nucleic acids are NOT DETECTED.  The SARS-CoV-2 RNA is generally detectable in upper respiratory specimens during the acute phase of infection. The lowest concentration of SARS-CoV-2 viral copies this assay can detect is 138 copies/mL. A negative result does not preclude SARS-Cov-2 infection and should not be used as the sole basis for treatment or other patient management decisions. A negative result may occur with  improper specimen collection/handling, submission of specimen other than nasopharyngeal swab, presence of viral mutation(s) within the areas targeted by this assay, and inadequate number of viral copies(<138 copies/mL). A negative result must be combined with clinical observations, patient history, and epidemiological information. The  expected result is Negative.  Fact Sheet for Patients:  BloggerCourse.com  Fact Sheet for Healthcare Providers:  SeriousBroker.it  This test is no t yet approved or cleared by the [place] FDA and  has been authorized for detection and/or diagnosis of SARS-CoV-2 by FDA under an Emergency Use Authorization (EUA). This EUA will remain  in effect (meaning this test can be used) for the duration of the COVID-19 declaration under Section 564(b)(1) of the Act, 21 U.S.C.section 360bbb-3(b)(1), unless the authorization is terminated  or revoked sooner.       Influenza A by PCR NEGATIVE NEGATIVE Final   Influenza B by PCR NEGATIVE NEGATIVE  Final    Comment: (NOTE) The Xpert Xpress SARS-CoV-2/FLU/RSV plus assay is intended as an aid in the diagnosis of influenza from Nasopharyngeal swab specimens and should not be used as a sole basis for treatment. Nasal washings and aspirates are unacceptable for Xpert Xpress SARS-CoV-2/FLU/RSV testing.  Fact Sheet for Patients: BloggerCourse.com  Fact Sheet for Healthcare Providers: SeriousBroker.it  This test is not yet approved or cleared by the [place] FDA and has been authorized for detection and/or diagnosis of SARS-CoV-2 by FDA under an Emergency Use Authorization (EUA). This EUA will remain in effect (meaning this test can be used) for the duration of the COVID-19 declaration under Section 564(b)(1) of the Act, 21 U.S.C. section 360bbb-3(b)(1), unless the authorization is terminated or revoked.     Resp Syncytial Virus by PCR NEGATIVE NEGATIVE Final    Comment: (NOTE) Fact Sheet for Patients: BloggerCourse.com  Fact Sheet for Healthcare Providers: SeriousBroker.it  This test is not yet approved or cleared by the [place] FDA and has been authorized for detection and/or diagnosis of SARS-CoV-2 by FDA under an Emergency Use Authorization (EUA). This EUA will remain in effect (meaning this test can be used) for the duration of the COVID-19 declaration under Section 564(b)(1) of the Act, 21 U.S.C. section 360bbb-3(b)(1), unless the authorization is terminated or revoked.  Performed at [facility], [address] [place], [place] [address]   Group A Strep by PCR     Status: None   Collection Time: 10/11/23  4:58 PM   Specimen: Anterior Nasal Swab; Sterile Swab  Result Value Ref Range Status   Group A Strep by PCR NOT DETECTED NOT DETECTED Final    Comment: Performed at [facility], [address] [place], [place]  [address]    Radiology Reports CT TIBIA FIBULA RIGHT WO CONTRAST Result Date: 10/12/2023 CLINICAL DATA:  Necrotizing fasciitis suspected. Foot x-ray done. History of nec fasc. Leukocytosis. EXAM: CT OF THE LOWER RIGHT EXTREMITY WITHOUT CONTRAST TECHNIQUE: Multidetector CT imaging of the right lower extremity was performed according to the standard protocol. RADIATION DOSE REDUCTION: This exam was performed according to the departmental dose-optimization program which includes automated exposure control, adjustment of the mA and/or kV according to patient size and/or use of iterative reconstruction technique. COMPARISON:  Same day radiographs FINDINGS: Bones/Joint/Cartilage No acute fracture or dislocation. No knee joint effusion. No evidence of osteomyelitis. Ligaments Suboptimally assessed by CT. Muscles and Tendons No evidence of deep tissue infection. Soft tissues Bilateral inguinal lymphadenopathy. 1.9 cm left inguinal lymph node on series 5/image 57 and 2.0 cm right inguinal node on 5/63. There is perilymphatic fat stranding about the right inguinal lymph nodes. Additional enlarged right external iliac node (series 5/image 42) measuring 13 mm. Subcutaneous edema about the right lower extremity greatest in the calf. No soft tissue gas. No abscess.  IMPRESSION: 1. Subcutaneous edema about the right lower extremity greatest in the calf is nonspecific but can be seen with cellulitis. No soft tissue gas. No abscess. 2. Bilateral inguinal lymphadenopathy with perilymphatic fat stranding about the right inguinal lymph nodes. Additional enlarged right external iliac node. These are likely reactive. Electronically Signed   By: Minerva Fester M.D.   On: 10/12/2023 00:12   CT FEMUR RIGHT WO CONTRAST Result Date: 10/12/2023 CLINICAL DATA:  Necrotizing fasciitis suspected. Foot x-ray done. History of nec fasc. Leukocytosis. EXAM: CT OF THE LOWER RIGHT EXTREMITY WITHOUT CONTRAST TECHNIQUE: Multidetector CT imaging of the  right lower extremity was performed according to the standard protocol. RADIATION DOSE REDUCTION: This exam was performed according to the departmental dose-optimization program which includes automated exposure control, adjustment of the mA and/or kV according to patient size and/or use of iterative reconstruction technique. COMPARISON:  Same day radiographs FINDINGS: Bones/Joint/Cartilage No acute fracture or dislocation. No knee joint effusion. No evidence of osteomyelitis. Ligaments Suboptimally assessed by CT. Muscles and Tendons No evidence of deep tissue infection. Soft tissues Bilateral inguinal lymphadenopathy. 1.9 cm left inguinal lymph node on series 5/image 57 and 2.0 cm right inguinal node on 5/63. There is perilymphatic fat stranding about the right inguinal lymph nodes. Additional enlarged right external iliac node (series 5/image 42) measuring 13 mm. Subcutaneous edema about the right lower extremity greatest in the calf. No soft tissue gas. No abscess. IMPRESSION: 1. Subcutaneous edema about the right lower extremity greatest in the calf is nonspecific but can be seen with cellulitis. No soft tissue gas. No abscess. 2. Bilateral inguinal lymphadenopathy with perilymphatic fat stranding about the right inguinal lymph nodes. Additional enlarged right external iliac node. These are likely reactive. Electronically Signed   By: Minerva Fester M.D.   On: 10/12/2023 00:12   DG Tibia/Fibula Right Result Date: 10/11/2023 CLINICAL DATA:  Leg pain EXAM: RIGHT TIBIA AND FIBULA - 2 VIEW COMPARISON:  None Available. FINDINGS: No fracture or malalignment. Extensive subcutaneous edema. No osseous erosive change IMPRESSION: No acute osseous abnormality Electronically Signed   By: Jasmine Pang M.D.   On: 10/11/2023 23:00   DG Femur Min 2 Views Right Result Date: 10/11/2023 CLINICAL DATA:  Leg pain fever EXAM: RIGHT FEMUR 2 VIEWS COMPARISON:  None Available. FINDINGS: There is no evidence of fracture or other  focal bone lesions. Soft tissues are unremarkable. IMPRESSION: Negative. Electronically Signed   By: Jasmine Pang M.D.   On: 10/11/2023 22:59   DG Chest 2 View Result Date: 10/11/2023 CLINICAL DATA:  Fever and flu-like symptoms. EXAM: CHEST - 2 VIEW COMPARISON:  04/28/2023 FINDINGS: Stable cardiomegaly. Pulmonary vascular congestion. Left mid lung atelectasis/scarring. The lungs are otherwise clear. No pleural effusion or pneumothorax. IMPRESSION: Cardiomegaly and pulmonary vascular congestion. Electronically Signed   By: Minerva Fester M.D.   On: 10/11/2023 20:17      Signature  -   Steffanie Rainwater M.D on 10/13/2023 at 11:29 AM   -  To page go to [email or web address]

## 2023-10-13 NOTE — Plan of Care (Signed)
  Problem: Education: Goal: Ability to describe self-care measures that may prevent or decrease complications (Diabetes Survival Skills Education) will improve Outcome: Progressing Goal: Individualized Educational Video(s) Outcome: Progressing   Problem: Coping: Goal: Ability to adjust to condition or change in health will improve Outcome: Progressing   Problem: Fluid Volume: Goal: Ability to maintain a balanced intake and output will improve Outcome: Progressing   Problem: Health Behavior/Discharge Planning: Goal: Ability to identify and utilize available resources and services will improve Outcome: Progressing Goal: Ability to manage health-related needs will improve Outcome: Progressing   Problem: Metabolic: Goal: Ability to maintain appropriate glucose levels will improve Outcome: Progressing   Problem: Nutritional: Goal: Maintenance of adequate nutrition will improve Outcome: Progressing Goal: Progress toward achieving an optimal weight will improve Outcome: Progressing   Problem: Skin Integrity: Goal: Risk for impaired skin integrity will decrease Outcome: Progressing   Problem: Tissue Perfusion: Goal: Adequacy of tissue perfusion will improve Outcome: Progressing   Problem: Education: Goal: Knowledge of General Education information will improve Description: Including pain rating scale, medication(s)/side effects and non-pharmacologic comfort measures Outcome: Progressing   Problem: Health Behavior/Discharge Planning: Goal: Ability to manage health-related needs will improve Outcome: Progressing   Problem: Clinical Measurements: Goal: Ability to maintain clinical measurements within normal limits will improve Outcome: Progressing Goal: Will remain free from infection Outcome: Progressing Goal: Diagnostic test results will improve Outcome: Progressing Goal: Respiratory complications will improve Outcome: Progressing Goal: Cardiovascular complication will  be avoided Outcome: Progressing   Problem: Activity: Goal: Risk for activity intolerance will decrease Outcome: Progressing   Problem: Nutrition: Goal: Adequate nutrition will be maintained Outcome: Progressing   Problem: Coping: Goal: Level of anxiety will decrease Outcome: Progressing   Problem: Elimination: Goal: Will not experience complications related to bowel motility Outcome: Progressing Goal: Will not experience complications related to urinary retention Outcome: Progressing   Problem: Pain Managment: Goal: General experience of comfort will improve and/or be controlled Outcome: Progressing   Problem: Safety: Goal: Ability to remain free from injury will improve Outcome: Progressing   Problem: Skin Integrity: Goal: Risk for impaired skin integrity will decrease Outcome: Progressing   Problem: Education: Goal: Ability to describe self-care measures that may prevent or decrease complications (Diabetes Survival Skills Education) will improve Outcome: Progressing Goal: Individualized Educational Video(s) Outcome: Progressing   Problem: Coping: Goal: Ability to adjust to condition or change in health will improve Outcome: Progressing   Problem: Fluid Volume: Goal: Ability to maintain a balanced intake and output will improve Outcome: Progressing   Problem: Health Behavior/Discharge Planning: Goal: Ability to identify and utilize available resources and services will improve Outcome: Progressing Goal: Ability to manage health-related needs will improve Outcome: Progressing   Problem: Metabolic: Goal: Ability to maintain appropriate glucose levels will improve Outcome: Progressing   Problem: Nutritional: Goal: Maintenance of adequate nutrition will improve Outcome: Progressing Goal: Progress toward achieving an optimal weight will improve Outcome: Progressing   Problem: Skin Integrity: Goal: Risk for impaired skin integrity will decrease Outcome:  Progressing   Problem: Tissue Perfusion: Goal: Adequacy of tissue perfusion will improve Outcome: Progressing   Problem: Clinical Measurements: Goal: Ability to avoid or minimize complications of infection will improve Outcome: Progressing   Problem: Skin Integrity: Goal: Skin integrity will improve Outcome: Progressing

## 2023-10-14 LAB — BASIC METABOLIC PANEL
Anion gap: 7 (ref 5–15)
BUN: 14 mg/dL (ref 6–20)
CO2: 25 mmol/L (ref 22–32)
Calcium: 7.5 mg/dL — ABNORMAL LOW (ref 8.9–10.3)
Chloride: 103 mmol/L (ref 98–111)
Creatinine, Ser: 0.73 mg/dL (ref 0.44–1.00)
GFR, Estimated: >60 mL/min (ref >60)
Glucose, Bld: 162 mg/dL — ABNORMAL HIGH (ref 70–99)
Potassium: 3.3 mmol/L — ABNORMAL LOW (ref 3.5–5.1)
Sodium: 135 mmol/L (ref 135–145)

## 2023-10-14 LAB — CBC WITH DIFFERENTIAL/PLATELET
Abs Immature Granulocytes: 0.07 10*3/uL (ref 0.00–0.07)
Basophils Absolute: 0 10*3/uL (ref 0.0–0.1)
Basophils Relative: 0 %
Eosinophils Absolute: 0.2 10*3/uL (ref 0.0–0.5)
Eosinophils Relative: 1 %
HCT: 30 % — ABNORMAL LOW (ref 36.0–46.0)
Hemoglobin: 9 g/dL — ABNORMAL LOW (ref 12.0–15.0)
Immature Granulocytes: 1 %
Lymphocytes Relative: 19 %
Lymphs Abs: 2.9 10*3/uL (ref 0.7–4.0)
MCH: 25.1 pg — ABNORMAL LOW (ref 26.0–34.0)
MCHC: 30 g/dL (ref 30.0–36.0)
MCV: 83.8 fL (ref 80.0–100.0)
Monocytes Absolute: 1.1 10*3/uL — ABNORMAL HIGH (ref 0.1–1.0)
Monocytes Relative: 7 %
Neutro Abs: 10.7 10*3/uL — ABNORMAL HIGH (ref 1.7–7.7)
Neutrophils Relative %: 72 %
Platelets: 259 10*3/uL (ref 150–400)
RBC: 3.58 MIL/uL — ABNORMAL LOW (ref 3.87–5.11)
RDW: 14.6 % (ref 11.5–15.5)
WBC: 15 10*3/uL — ABNORMAL HIGH (ref 4.0–10.5)
nRBC: 0 % (ref 0.0–0.2)

## 2023-10-14 LAB — LIPID PANEL
Cholesterol: 143 mg/dL (ref 0–200)
HDL: 29 mg/dL — ABNORMAL LOW (ref >40)
LDL Cholesterol: 69 mg/dL (ref 0–99)
Total CHOL/HDL Ratio: 4.9 {ratio}
Triglycerides: 224 mg/dL — ABNORMAL HIGH (ref <150)
VLDL: 45 mg/dL — ABNORMAL HIGH (ref 0–40)

## 2023-10-14 LAB — PHOSPHORUS: Phosphorus: 2.5 mg/dL (ref 2.5–4.6)

## 2023-10-14 LAB — GLUCOSE, CAPILLARY
Glucose-Capillary: 156 mg/dL — ABNORMAL HIGH (ref 70–99)
Glucose-Capillary: 115 mg/dL — ABNORMAL HIGH (ref 70–99)
Glucose-Capillary: 130 mg/dL — ABNORMAL HIGH (ref 70–99)
Glucose-Capillary: 163 mg/dL — ABNORMAL HIGH (ref 70–99)

## 2023-10-14 LAB — MAGNESIUM: Magnesium: 1.9 mg/dL (ref 1.7–2.4)

## 2023-10-14 NOTE — Plan of Care (Signed)

## 2023-10-14 NOTE — Progress Notes (Signed)
PROGRESS NOTE                                                                                                                                                                                                             Patient Demographics:    Carolyn Zimmerman, is a 38 y.o. female, DOB - [birth year], ZOX:[id number]  Outpatient Primary MD for the patient is Carolyn Andrew, NP    LOS - 2  Admit date - 10/11/2023    Chief Complaint  Patient presents with   Nausea       Brief Narrative (HPI from H&P)   Carolyn Zimmerman is a 38 y.o. female with medical history significant of anxiety, depression, coronavirus infection, left MCA watershed CVA, hyperlipidemia, hypertension, hypertensive heart disease, chronic combined systolic heart failure with EF of 40 to 45% in June 2024, jaundice, class III obesity, tobacco use, history of acute pancreatitis, insulin-dependent type 2 diabetes,  tubo-ovarian abscess, hidradenitis suppurativa, chest wall abscesses, history of sepsis secondary to necrotizing fasciitis due to panniculitis with abdominal wall abscess requiring panniculectomy who presented to emergency department with nausea, vomiting, chills, fever, malaise and RLE tenderness with erythema/edema and admitted for sepsis secondary to cellulitis.  The patient was sleeping soundly upon my visit. She complained bitterly of pain in the right lower extremity.    Subjective:   [name] The patient stated that the pain in her left lower extremity had improved, but the pain in her right lower extremity remained severe. There have been no fevers or chills in the past 24 hours.    Assessment  & Plan :    Assessment and Plan: Sepsis secondary to cellulitis The patient presented on 10/11/2023 with fever of with temp greater than 100.2, a greater than 20 mmHg drop in systolic blood pressure, tachypnea, and tachycardia in the setting of cellulitis of  the right lower extremity. WBC was 34.4.Marland Kitchen  RLE cellulitis -The right lower extremity was vacuely erythematous, but very tender to touch. The leg was warm. No blistering or bulla.  -The left lower extremity was not erythematous painful, or warm. -Continue IV Rocephin -WBC has decreased to 15.0. -Will begin to wean IV Dilaudid tomorrow. -RLE ultrasound has ruled out DVT -WBC, fever curve are diminishing.  T2DM, uncontrolled A1c of 11.7% on admission in the setting of not taking any insulin  or diabetes medication over the last 3 months. Fasting blood sugar of 203 this morning. -Resume home metformin -Increase Semglee from 25 to 30 units daily -Continue SSI with meals, CBG monitoring  HTN BP elevated with SBP in the 140s to 180s overnight.  The Coreg was increased back to her normal dose with improvement in SBP to 130s. -Increase Coreg from 12.5 mg BID to 25 mg BID -Start irbesartan 300 mg daily (substitute for home valsartan 320 mg)  HFrEF Last TTE in June 2024 showed EF 40-45%, LV global hypokinesis, moderate eccentric LVH, G2DD, and severely dilated LA. patient has received 7.5 L of fluid since admission with urine output of Foley 1.5 L.  She is not in respiratory distress but is slightly fluid overloaded.  GDMT previously included losartan, spironolactone, Coreg and torsemide however patient has not taken these medications for the last 3 months. -Telemetry -Resume torsemide 40 mg daily -Continue beta-blocker and ARB as above -Trend and replete electrolytes -Strict I&O's, daily weights  HLD Hx CVA -Resume aspirin and rosuvastatin -Check lipid panel  Tobacco use disorder -Continue nicotine patch  Hypomagnesemia, resolved Hypokalemia, resolved Hypophosphatemia, continue phosphorus tablets, follow-up repeat phosphorus tomorrow  Social determinants of health Reports financial difficulties and food insecurity.  She has had difficulty affording her medications over the last 3  months. -TOC consulted, appreciate assistance        Morbid obesity: Estimated body mass index is 46.72 kg/m as calculated from the following:   Height as of this encounter: 5\' 7"  (1.702 m).   Weight as of this encounter: 135.3 kg.  -Follow-up with PCP for nutrition and weight loss counseling        Condition -improving  Family Communication  : None  Code Status : Full code  Consults  : None  PUD Prophylaxis : None   Procedures  :     None      Disposition Plan  :    Status is: Inpatient Remains inpatient appropriate because: Treatment for cellulitis, RLE swelling and pain  DVT Prophylaxis  :  Lovenox    Lab Results  Component Value Date   PLT 259 10/14/2023    Diet :  Diet Order             Diet heart healthy/carb modified Fluid consistency: Thin; Fluid restriction: 1500 mL Fluid  Diet effective now                    Inpatient Medications  Scheduled Meds:  aspirin  81 mg Oral Daily   carvedilol  25 mg Oral BID   enoxaparin (LOVENOX) injection  60 mg Subcutaneous Q24H   insulin aspart  0-20 Units Subcutaneous TID WC   insulin glargine-yfgn  30 Units Subcutaneous Daily   irbesartan  300 mg Oral Daily   metFORMIN  1,000 mg Oral BID WC   nicotine  21 mg Transdermal Daily   rosuvastatin  20 mg Oral Daily   torsemide  40 mg Oral Daily   Continuous Infusions:  cefTRIAXone (ROCEPHIN)  IV 2 g (10/14/23 0821)   PRN Meds:.acetaminophen **OR** acetaminophen, HYDROmorphone (DILAUDID) injection, ibuprofen, ondansetron **OR** ondansetron (ZOFRAN) IV, prochlorperazine  Antibiotics  :    Anti-infectives (From admission, onward)    Start     Dose/Rate Route Frequency Ordered Stop   10/12/23 0800  cefTRIAXone (ROCEPHIN) 2 g in sodium chloride 0.9 % 100 mL IVPB        2 g 200 mL/hr over 30 Minutes Intravenous  Every 24 hours 10/12/23 0754     10/12/23 0030  vancomycin (VANCOCIN) IVPB 1000 mg/200 mL premix       Placed in "Followed by" Linked Group    1,000 mg 200 mL/hr over 60 Minutes Intravenous  Once 10/11/23 2317 10/12/23 0302   10/11/23 2330  vancomycin (VANCOCIN) IVPB 1000 mg/200 mL premix       Placed in "Followed by" Linked Group   1,000 mg 200 mL/hr over 60 Minutes Intravenous  Once 10/11/23 2317 10/12/23 0139   10/11/23 2300  ceFEPIme (MAXIPIME) 2 g in sodium chloride 0.9 % 100 mL IVPB        2 g 200 mL/hr over 30 Minutes Intravenous  Once 10/11/23 2258 10/12/23 0035   10/11/23 2300  metroNIDAZOLE (FLAGYL) IVPB 500 mg        500 mg 100 mL/hr over 60 Minutes Intravenous  Once 10/11/23 2258 10/12/23 0139   10/11/23 2300  vancomycin (VANCOCIN) IVPB 1000 mg/200 mL premix  Status:  Discontinued        1,000 mg 200 mL/hr over 60 Minutes Intravenous  Once 10/11/23 2258 10/11/23 2314         Objective:   Vitals:   10/13/23 2031 10/14/23 0448 10/14/23 0632 10/14/23 1324  BP: (!) 176/90  (!) 165/77 (!) 155/80  Pulse: 86  77   Resp: 18  18 18   Temp: 98.9 F (37.2 C)  98.4 F (36.9 C) 98.4 F (36.9 C)  TempSrc:    Oral  SpO2: 100%  100% 100%  Weight:  135.3 kg    Height:        Wt Readings from Last 3 Encounters:  10/14/23 135.3 kg  10/04/23 126.1 kg  05/31/23 126 kg     Intake/Output Summary (Last 24 hours) at 10/14/2023 1836 Last data filed at 10/14/2023 0500 Gross per 24 hour  Intake 480 ml  Output --  Net 480 ml     Exam:  Constitutional:  The patient is awake, alert, and oriented x 3. Mild distress from discomfort. Respiratory:  No increased work of breathing. No wheezes, rales, or rhonchi No tactile fremitus Cardiovascular:  Regular rate and rhythm No murmurs, ectopy, or gallups. No lateral PMI. No thrills. Abdomen:  Abdomen is soft, non-tender, non-distended No hernias, masses, or organomegaly Normoactive bowel sounds.  Musculoskeletal:  Left lower extremity without cyanosis or clubbing. No erythema or warmth. Non-tender. Right lower extremity is warm, tender to touch and  erythematous. Skin:  No rashes, lesions, ulcers palpation of skin: no induration or nodules Erythema and warmth of right lower extremity. Neurologic:  CN 2-12 intact Sensation all 4 extremities intact Psychiatric:  Mental status Mood, affect appropriate Orientation to person, place, time  judgment and insight appear intact       Data Review:    Recent Labs  Lab 10/11/23 2231 10/12/23 1338 10/13/23 0652 10/14/23 0554  WBC 38.4* 34.4* 24.6* 15.0*  HGB 11.3* 10.6* 10.7* 9.0*  HCT 34.4* 34.0* 34.5* 30.0*  PLT 336 280 297 259  MCV 79.1* 83.5 82.5 83.8  MCH 26.0 26.0 25.6* 25.1*  MCHC 32.8 31.2 31.0 30.0  RDW 14.4 14.4 14.8 14.6  LYMPHSABS 1.2 2.8 2.5 2.9  MONOABS 2.3* 1.4* 1.2* 1.1*  EOSABS 0.0 0.0 0.0 0.2  BASOSABS 0.0 0.1 0.1 0.0    Recent Labs  Lab 10/11/23 2231 10/11/23 2300 10/12/23 0023 10/12/23 0314 10/12/23 0557 10/12/23 0952 10/12/23 1338 10/13/23 0652 10/14/23 0554  NA  --  130*  --   --   --   --  135 134* 135  K  --  3.6  --   --   --   --  3.4* 4.0 3.3*  CL  --  99  --   --   --   --  102 102 103  CO2  --  23  --   --   --   --  23 23 25   ANIONGAP  --  8  --   --   --   --  10 9 7   GLUCOSE  --  374*  --   --   --   --  124* 225* 162*  BUN  --  11  --   --   --   --  11 11 14   CREATININE  --  1.19*  --   --   --   --  1.01* 0.86 0.73  AST  --  16  --   --   --   --  21 18  --   ALT  --  15  --   --   --   --  17 18  --   ALKPHOS  --  68  --   --   --   --  59 82  --   BILITOT  --  0.6  --   --   --   --  0.5 0.5  --   ALBUMIN  --  2.8*  --   --   --   --  2.0* 2.1*  --   LATICACIDVEN 2.5*  --  2.6* 2.9* 1.9  --   --   --   --   HGBA1C  --   --   --   --   --  11.7*  --   --   --   MG  --   --   --   --   --   --  1.6* 2.2 1.9  CALCIUM  --  7.9*  --   --   --   --  7.7* 7.7* 7.5*      Recent Labs  Lab 10/11/23 2231 10/11/23 2300 10/12/23 0023 10/12/23 0314 10/12/23 0557 10/12/23 0952 10/12/23 1338 10/13/23 0652 10/14/23 0554   LATICACIDVEN 2.5*  --  2.6* 2.9* 1.9  --   --   --   --   HGBA1C  --   --   --   --   --  11.7*  --   --   --   MG  --   --   --   --   --   --  1.6* 2.2 1.9  CALCIUM  --  7.9*  --   --   --   --  7.7* 7.7* 7.5*    --------------------------------------------------------------------------------------------------------------- Lab Results  Component Value Date   CHOL 143 10/14/2023   HDL 29 (L) 10/14/2023   LDLCALC 69 10/14/2023   LDLDIRECT 64.0 03/26/2023   TRIG 224 (H) 10/14/2023   CHOLHDL 4.9 10/14/2023    Lab Results  Component Value Date   HGBA1C 11.7 (H) 10/12/2023   No results for input(s): "TSH", "T4TOTAL", "FREET4", "T3FREE", "THYROIDAB" in the last 72 hours. No results for input(s): "VITAMINB12", "FOLATE", "FERRITIN", "TIBC", "IRON", "RETICCTPCT" in the last 72 hours. ------------------------------------------------------------------------------------------------------------------ Cardiac Enzymes No results for input(s): "CKMB", "TROPONINI", "MYOGLOBIN" in the last 168 hours.  Invalid input(s): "CK"  Micro Results Recent Results (from the past 240 hours)  Resp panel by RT-PCR (RSV,  Flu A&B, Covid) Anterior Nasal Swab     Status: None   Collection Time: 10/11/23  4:58 PM   Specimen: Anterior Nasal Swab  Result Value Ref Range Status   SARS Coronavirus 2 by RT PCR NEGATIVE NEGATIVE Final    Comment: (NOTE) SARS-CoV-2 target nucleic acids are NOT DETECTED.  The SARS-CoV-2 RNA is generally detectable in upper respiratory specimens during the acute phase of infection. The lowest concentration of SARS-CoV-2 viral copies this assay can detect is 138 copies/mL. A negative result does not preclude SARS-Cov-2 infection and should not be used as the sole basis for treatment or other patient management decisions. A negative result may occur with  improper specimen collection/handling, submission of specimen other than nasopharyngeal swab, presence of viral mutation(s)  within the areas targeted by this assay, and inadequate number of viral copies(<138 copies/mL). A negative result must be combined with clinical observations, patient history, and epidemiological information. The expected result is Negative.  Fact Sheet for Patients:  BloggerCourse.com  Fact Sheet for Healthcare Providers:  SeriousBroker.it  This test is no t yet approved or cleared by the [place] FDA and  has been authorized for detection and/or diagnosis of SARS-CoV-2 by FDA under an Emergency Use Authorization (EUA). This EUA will remain  in effect (meaning this test can be used) for the duration of the COVID-19 declaration under Section 564(b)(1) of the Act, 21 U.S.C.section 360bbb-3(b)(1), unless the authorization is terminated  or revoked sooner.       Influenza A by PCR NEGATIVE NEGATIVE Final   Influenza B by PCR NEGATIVE NEGATIVE Final    Comment: (NOTE) The Xpert Xpress SARS-CoV-2/FLU/RSV plus assay is intended as an aid in the diagnosis of influenza from Nasopharyngeal swab specimens and should not be used as a sole basis for treatment. Nasal washings and aspirates are unacceptable for Xpert Xpress SARS-CoV-2/FLU/RSV testing.  Fact Sheet for Patients: BloggerCourse.com  Fact Sheet for Healthcare Providers: SeriousBroker.it  This test is not yet approved or cleared by the [place] FDA and has been authorized for detection and/or diagnosis of SARS-CoV-2 by FDA under an Emergency Use Authorization (EUA). This EUA will remain in effect (meaning this test can be used) for the duration of the COVID-19 declaration under Section 564(b)(1) of the Act, 21 U.S.C. section 360bbb-3(b)(1), unless the authorization is terminated or revoked.     Resp Syncytial Virus by PCR NEGATIVE NEGATIVE Final    Comment: (NOTE) Fact Sheet for  Patients: BloggerCourse.com  Fact Sheet for Healthcare Providers: SeriousBroker.it  This test is not yet approved or cleared by the [place] FDA and has been authorized for detection and/or diagnosis of SARS-CoV-2 by FDA under an Emergency Use Authorization (EUA). This EUA will remain in effect (meaning this test can be used) for the duration of the COVID-19 declaration under Section 564(b)(1) of the Act, 21 U.S.C. section 360bbb-3(b)(1), unless the authorization is terminated or revoked.  Performed at [facility], [address], [place], [place] [address]   Group A Strep by PCR     Status: None   Collection Time: 10/11/23  4:58 PM   Specimen: Anterior Nasal Swab; Sterile Swab  Result Value Ref Range Status   Group A Strep by PCR NOT DETECTED NOT DETECTED Final    Comment: Performed at [facility], [address], [place], [place] [address]  Blood culture (routine x 2)     Status: None (Preliminary result)   Collection Time: 10/11/23 11:00 PM   Specimen:  BLOOD  Result Value Ref Range Status   Specimen Description   Final    BLOOD LEFT ANTECUBITAL Performed at [facility], [address], [place], [place] [address]    Special Requests   Final    BOTTLES DRAWN AEROBIC AND ANAEROBIC Blood Culture adequate volume Performed at [facility], [address] [place], [place] [address]    Culture   Final    NO GROWTH 2 DAYS Performed at [facility], [address], [place], [place] [address]    Report Status PENDING  Incomplete  Blood culture (routine x 2)     Status: None (Preliminary result)   Collection Time: 10/11/23 11:20 PM   Specimen: BLOOD  Result Value Ref Range Status   Specimen Description   Final    BLOOD BLOOD LEFT HAND Performed at [facility], [address] [place], [place] [address]    Special  Requests   Final    BOTTLES DRAWN AEROBIC AND ANAEROBIC Blood Culture adequate volume Performed at [facility], [address], [place], [place] [address]    Culture   Final    NO GROWTH 2 DAYS Performed at [facility], [address], [place], [place] [address]    Report Status PENDING  Incomplete    Radiology Reports VAS [place] LOWER EXTREMITY VENOUS (DVT) Result Date: 10/14/2023  Lower Venous DVT Study Patient Name:  TRINETTA ALEMU  Date of Exam:   10/13/2023 Medical Rec #: [id number]     Accession #:    [id number] Date of Birth: [birth year]     Patient Gender: F Patient Age:   86 years Exam Location:  [facility] Procedure:      VAS [place] LOWER EXTREMITY VENOUS (DVT) Referring Phys: PROSPER AMPONSAH --------------------------------------------------------------------------------  Indications: Pain.  Risk Factors: None identified. Limitations: Body habitus, poor ultrasound/tissue interface and patient pain tolerance. Comparison Study: No prior studies. Performing Technologist: Chanda Busing RVT  Examination Guidelines: A complete evaluation includes B-mode imaging, spectral Doppler, color Doppler, and power Doppler as needed of all accessible portions of each vessel. Bilateral testing is considered an integral part of a complete examination. Limited examinations for reoccurring indications may be performed as noted. The reflux portion of the exam is performed with the patient in reverse Trendelenburg.  +---------+---------------+---------+-----------+----------+-------------------+ RIGHT    CompressibilityPhasicitySpontaneityPropertiesThrombus Aging      +---------+---------------+---------+-----------+----------+-------------------+ CFV      Full           Yes      Yes                                      +---------+---------------+---------+-----------+----------+-------------------+ SFJ      Full                                                              +---------+---------------+---------+-----------+----------+-------------------+ FV Prox  Full                                                             +---------+---------------+---------+-----------+----------+-------------------+  FV Mid   Full                                                             +---------+---------------+---------+-----------+----------+-------------------+ FV DistalFull                                                             +---------+---------------+---------+-----------+----------+-------------------+ PFV      Full                                                             +---------+---------------+---------+-----------+----------+-------------------+ POP      Full           Yes      Yes                                      +---------+---------------+---------+-----------+----------+-------------------+ PTV                                                   Not well visualized +---------+---------------+---------+-----------+----------+-------------------+ PERO                                                  Not well visualized +---------+---------------+---------+-----------+----------+-------------------+   +----+---------------+---------+-----------+----------+--------------+ LEFTCompressibilityPhasicitySpontaneityPropertiesThrombus Aging +----+---------------+---------+-----------+----------+--------------+ CFV Full           Yes      Yes                                 +----+---------------+---------+-----------+----------+--------------+     Summary: RIGHT: - There is no evidence of deep vein thrombosis in the lower extremity. However, portions of this examination were limited- see technologist comments above.  - No cystic structure found in the popliteal fossa.  LEFT: - No evidence of common femoral vein obstruction.   *See table(s) above for measurements and observations. Electronically signed by Carolynn Sayers on 10/14/2023 at 9:11:38 AM.    Final    CT TIBIA FIBULA RIGHT WO CONTRAST Result Date: 10/12/2023 CLINICAL DATA:  Necrotizing fasciitis suspected. Foot x-ray done. History of nec fasc. Leukocytosis. EXAM: CT OF THE LOWER RIGHT EXTREMITY WITHOUT CONTRAST TECHNIQUE: Multidetector CT imaging of the right lower extremity was performed according to the standard protocol. RADIATION DOSE REDUCTION: This exam was performed according to the departmental dose-optimization program which includes automated exposure control, adjustment of the mA and/or kV according to patient size and/or use of iterative reconstruction technique. COMPARISON:  Same day radiographs FINDINGS: Bones/Joint/Cartilage No acute fracture or dislocation. No knee joint  effusion. No evidence of osteomyelitis. Ligaments Suboptimally assessed by CT. Muscles and Tendons No evidence of deep tissue infection. Soft tissues Bilateral inguinal lymphadenopathy. 1.9 cm left inguinal lymph node on series 5/image 57 and 2.0 cm right inguinal node on 5/63. There is perilymphatic fat stranding about the right inguinal lymph nodes. Additional enlarged right external iliac node (series 5/image 42) measuring 13 mm. Subcutaneous edema about the right lower extremity greatest in the calf. No soft tissue gas. No abscess. IMPRESSION: 1. Subcutaneous edema about the right lower extremity greatest in the calf is nonspecific but can be seen with cellulitis. No soft tissue gas. No abscess. 2. Bilateral inguinal lymphadenopathy with perilymphatic fat stranding about the right inguinal lymph nodes. Additional enlarged right external iliac node. These are likely reactive. Electronically Signed   By: Minerva Fester M.D.   On: 10/12/2023 00:12   CT FEMUR RIGHT WO CONTRAST Result Date: 10/12/2023 CLINICAL DATA:  Necrotizing fasciitis suspected. Foot x-ray done. History of nec fasc. Leukocytosis. EXAM: CT OF THE LOWER RIGHT EXTREMITY WITHOUT CONTRAST TECHNIQUE:  Multidetector CT imaging of the right lower extremity was performed according to the standard protocol. RADIATION DOSE REDUCTION: This exam was performed according to the departmental dose-optimization program which includes automated exposure control, adjustment of the mA and/or kV according to patient size and/or use of iterative reconstruction technique. COMPARISON:  Same day radiographs FINDINGS: Bones/Joint/Cartilage No acute fracture or dislocation. No knee joint effusion. No evidence of osteomyelitis. Ligaments Suboptimally assessed by CT. Muscles and Tendons No evidence of deep tissue infection. Soft tissues Bilateral inguinal lymphadenopathy. 1.9 cm left inguinal lymph node on series 5/image 57 and 2.0 cm right inguinal node on 5/63. There is perilymphatic fat stranding about the right inguinal lymph nodes. Additional enlarged right external iliac node (series 5/image 42) measuring 13 mm. Subcutaneous edema about the right lower extremity greatest in the calf. No soft tissue gas. No abscess. IMPRESSION: 1. Subcutaneous edema about the right lower extremity greatest in the calf is nonspecific but can be seen with cellulitis. No soft tissue gas. No abscess. 2. Bilateral inguinal lymphadenopathy with perilymphatic fat stranding about the right inguinal lymph nodes. Additional enlarged right external iliac node. These are likely reactive. Electronically Signed   By: Minerva Fester M.D.   On: 10/12/2023 00:12   DG Tibia/Fibula Right Result Date: 10/11/2023 CLINICAL DATA:  Leg pain EXAM: RIGHT TIBIA AND FIBULA - 2 VIEW COMPARISON:  None Available. FINDINGS: No fracture or malalignment. Extensive subcutaneous edema. No osseous erosive change IMPRESSION: No acute osseous abnormality Electronically Signed   By: Jasmine Pang M.D.   On: 10/11/2023 23:00   DG Femur Min 2 Views Right Result Date: 10/11/2023 CLINICAL DATA:  Leg pain fever EXAM: RIGHT FEMUR 2 VIEWS COMPARISON:  None Available. FINDINGS: There is  no evidence of fracture or other focal bone lesions. Soft tissues are unremarkable. IMPRESSION: Negative. Electronically Signed   By: Jasmine Pang M.D.   On: 10/11/2023 22:59   DG Chest 2 View Result Date: 10/11/2023 CLINICAL DATA:  Fever and flu-like symptoms. EXAM: CHEST - 2 VIEW COMPARISON:  04/28/2023 FINDINGS: Stable cardiomegaly. Pulmonary vascular congestion. Left mid lung atelectasis/scarring. The lungs are otherwise clear. No pleural effusion or pneumothorax. IMPRESSION: Cardiomegaly and pulmonary vascular congestion. Electronically Signed   By: Minerva Fester M.D.   On: 10/11/2023 20:17      Signature  -   Fran Lowes M.D on 10/14/2023 at 6:36 PM   -  To page go to [email or web address]

## 2023-10-15 ENCOUNTER — Other Ambulatory Visit: Payer: Self-pay

## 2023-10-15 LAB — GLUCOSE, CAPILLARY
Glucose-Capillary: 128 mg/dL — ABNORMAL HIGH (ref 70–99)
Glucose-Capillary: 152 mg/dL — ABNORMAL HIGH (ref 70–99)
Glucose-Capillary: 173 mg/dL — ABNORMAL HIGH (ref 70–99)
Glucose-Capillary: 103 mg/dL — ABNORMAL HIGH (ref 70–99)

## 2023-10-15 LAB — CBC WITH DIFFERENTIAL/PLATELET
Abs Immature Granulocytes: 0.07 10*3/uL (ref 0.00–0.07)
Basophils Absolute: 0 10*3/uL (ref 0.0–0.1)
Basophils Relative: 0 %
Eosinophils Absolute: 0.1 10*3/uL (ref 0.0–0.5)
Eosinophils Relative: 1 %
HCT: 29.8 % — ABNORMAL LOW (ref 36.0–46.0)
Hemoglobin: 9 g/dL — ABNORMAL LOW (ref 12.0–15.0)
Immature Granulocytes: 1 %
Lymphocytes Relative: 28 %
Lymphs Abs: 3 10*3/uL (ref 0.7–4.0)
MCH: 25.5 pg — ABNORMAL LOW (ref 26.0–34.0)
MCHC: 30.2 g/dL (ref 30.0–36.0)
MCV: 84.4 fL (ref 80.0–100.0)
Monocytes Absolute: 1 10*3/uL (ref 0.1–1.0)
Monocytes Relative: 9 %
Neutro Abs: 6.3 10*3/uL (ref 1.7–7.7)
Neutrophils Relative %: 61 %
Platelets: 285 10*3/uL (ref 150–400)
RBC: 3.53 MIL/uL — ABNORMAL LOW (ref 3.87–5.11)
RDW: 14.4 % (ref 11.5–15.5)
WBC: 10.6 10*3/uL — ABNORMAL HIGH (ref 4.0–10.5)
nRBC: 0 % (ref 0.0–0.2)

## 2023-10-15 LAB — BASIC METABOLIC PANEL
Anion gap: 9 (ref 5–15)
BUN: 11 mg/dL (ref 6–20)
CO2: 23 mmol/L (ref 22–32)
Calcium: 7.7 mg/dL — ABNORMAL LOW (ref 8.9–10.3)
Chloride: 103 mmol/L (ref 98–111)
Creatinine, Ser: 0.77 mg/dL (ref 0.44–1.00)
GFR, Estimated: >60 mL/min (ref >60)
Glucose, Bld: 163 mg/dL — ABNORMAL HIGH (ref 70–99)
Potassium: 3.1 mmol/L — ABNORMAL LOW (ref 3.5–5.1)
Sodium: 135 mmol/L (ref 135–145)

## 2023-10-15 MED ORDER — OXYCODONE HCL 5 MG PO TABS
5.0000 mg | ORAL_TABLET | ORAL | Status: DC | PRN
  Administered 2023-10-15 – 2023-10-20 (×16): 5 mg via ORAL
  Filled 2023-10-15 (×16): qty 1

## 2023-10-15 NOTE — Progress Notes (Signed)
PROGRESS NOTE                                                                                                                                                                                                             Patient Demographics:    Carolyn Zimmerman, is a 38 y.o. female, DOB - 12-10-1985, ZOX:[id number]  Outpatient Primary MD for the patient is Ivonne Andrew, NP    LOS - 3  Admit date - 10/11/2023    Chief Complaint  Patient presents with   Nausea       Brief Narrative (HPI from H&P)   [name] is a 38 y.o. female with medical history significant of anxiety, depression, coronavirus infection, left MCA watershed CVA, hyperlipidemia, hypertension, hypertensive heart disease, chronic combined systolic heart failure with EF of 40 to 45% in June 2024, jaundice, class III obesity, tobacco use, history of acute pancreatitis, insulin-dependent type 2 diabetes,  tubo-ovarian abscess, hidradenitis suppurativa, chest wall abscesses, history of sepsis secondary to necrotizing fasciitis due to panniculitis with abdominal wall abscess requiring panniculectomy who presented to emergency department with nausea, vomiting, chills, fever, malaise and RLE tenderness with erythema/edema and admitted for sepsis secondary to cellulitis.  The patient was sleeping soundly upon my visit. She complained bitterly of pain in the right lower extremity.    Subjective:   [name] The patient stated that the pain in her left lower extremity had improved, but the pain in her right lower extremity remained severe. There have been no fevers or chills in the past 24 hours.    Assessment  & Plan :    Assessment and Plan: Sepsis secondary to cellulitis The patient presented on 10/11/2023 with fever of with temp greater than 100.2, a greater than 20 mmHg drop in systolic blood pressure, tachypnea, and tachycardia in the setting of cellulitis of  the right lower extremity. WBC was 34.4.Marland Kitchen  RLE cellulitis -The right lower extremity was vacuely erythematous, but very tender to touch. The leg was warm. No blistering or bulla.  -The left lower extremity was not erythematous painful, or warm. -Continue IV Rocephin -WBC has decreased to 15.0. -Will begin to wean IV Dilaudid tomorrow. -RLE ultrasound has ruled out DVT -WBC, fever curve are diminishing.  T2DM, uncontrolled A1c of 11.7% on admission in the setting of not taking any insulin  or diabetes medication over the last 3 months. Fasting blood sugar of 203 this morning. -Resume home metformin -Increase Semglee from 25 to 30 units daily -Continue SSI with meals, CBG monitoring  HTN BP elevated with SBP in the 140s to 180s overnight.  The Coreg was increased back to her normal dose with improvement in SBP to 130s. -Increase Coreg from 12.5 mg BID to 25 mg BID -Start irbesartan 300 mg daily (substitute for home valsartan 320 mg)  HFrEF Last TTE in June 2024 showed EF 40-45%, LV global hypokinesis, moderate eccentric LVH, G2DD, and severely dilated LA. patient has received 7.5 L of fluid since admission with urine output of Foley 1.5 L.  She is not in respiratory distress but is slightly fluid overloaded.  GDMT previously included losartan, spironolactone, Coreg and torsemide however patient has not taken these medications for the last 3 months. -Telemetry -Resume torsemide 40 mg daily -Continue beta-blocker and ARB as above -Trend and replete electrolytes -Strict I&O's, daily weights  HLD Hx CVA -Resume aspirin and rosuvastatin -Check lipid panel  Tobacco use disorder -Continue nicotine patch  Hypomagnesemia, resolved Hypokalemia, resolved Hypophosphatemia, continue phosphorus tablets, follow-up repeat phosphorus tomorrow  Social determinants of health Reports financial difficulties and food insecurity.  She has had difficulty affording her medications over the last 3  months. -TOC consulted, appreciate assistance        Morbid obesity: Estimated body mass index is 46.72 kg/m as calculated from the following:   Height as of this encounter: 5\' 7"  (1.702 m).   Weight as of this encounter: 135.3 kg.  -Follow-up with PCP for nutrition and weight loss counseling        Condition -improving  Family Communication  : None  Code Status : Full code  Consults  : None  PUD Prophylaxis : None   Procedures  :     None      Disposition Plan  :    Status is: Inpatient Remains inpatient appropriate because: Treatment for cellulitis, RLE swelling and pain  DVT Prophylaxis  :  Lovenox    Lab Results  Component Value Date   PLT 285 10/15/2023    Diet :  Diet Order             Diet heart healthy/carb modified Fluid consistency: Thin; Fluid restriction: 1500 mL Fluid  Diet effective now                    Inpatient Medications  Scheduled Meds:  aspirin  81 mg Oral Daily   carvedilol  25 mg Oral BID   enoxaparin (LOVENOX) injection  60 mg Subcutaneous Q24H   insulin aspart  0-20 Units Subcutaneous TID WC   insulin glargine-yfgn  30 Units Subcutaneous Daily   irbesartan  300 mg Oral Daily   metFORMIN  1,000 mg Oral BID WC   nicotine  21 mg Transdermal Daily   rosuvastatin  20 mg Oral Daily   torsemide  40 mg Oral Daily   Continuous Infusions:  cefTRIAXone (ROCEPHIN)  IV 2 g (10/15/23 0918)   PRN Meds:.acetaminophen **OR** acetaminophen, ibuprofen, ondansetron **OR** ondansetron (ZOFRAN) IV, oxyCODONE, prochlorperazine  Antibiotics  :    Anti-infectives (From admission, onward)    Start     Dose/Rate Route Frequency Ordered Stop   10/12/23 0800  cefTRIAXone (ROCEPHIN) 2 g in sodium chloride 0.9 % 100 mL IVPB        2 g 200 mL/hr over 30 Minutes Intravenous Every 24  hours 10/12/23 0754     10/12/23 0030  vancomycin (VANCOCIN) IVPB 1000 mg/200 mL premix       Placed in "Followed by" Linked Group   1,000 mg 200 mL/hr  over 60 Minutes Intravenous  Once 10/11/23 2317 10/12/23 0302   10/11/23 2330  vancomycin (VANCOCIN) IVPB 1000 mg/200 mL premix       Placed in "Followed by" Linked Group   1,000 mg 200 mL/hr over 60 Minutes Intravenous  Once 10/11/23 2317 10/12/23 0139   10/11/23 2300  ceFEPIme (MAXIPIME) 2 g in sodium chloride 0.9 % 100 mL IVPB        2 g 200 mL/hr over 30 Minutes Intravenous  Once 10/11/23 2258 10/12/23 0035   10/11/23 2300  metroNIDAZOLE (FLAGYL) IVPB 500 mg        500 mg 100 mL/hr over 60 Minutes Intravenous  Once 10/11/23 2258 10/12/23 0139   10/11/23 2300  vancomycin (VANCOCIN) IVPB 1000 mg/200 mL premix  Status:  Discontinued        1,000 mg 200 mL/hr over 60 Minutes Intravenous  Once 10/11/23 2258 10/11/23 2314         Objective:   Vitals:   10/14/23 2144 10/15/23 0442 10/15/23 0725 10/15/23 1423  BP: (!) 178/76 (!) 172/80  (!) 180/99  Pulse: 79 74  72  Resp: 18 18  19   Temp: 99.7 F (37.6 C) 98.5 F (36.9 C)  98.7 F (37.1 C)  TempSrc: Oral Oral    SpO2: 97% 99%  100%  Weight:   135.3 kg   Height:        Wt Readings from Last 3 Encounters:  10/15/23 135.3 kg  10/04/23 126.1 kg  05/31/23 126 kg     Intake/Output Summary (Last 24 hours) at 10/15/2023 1842 Last data filed at 10/15/2023 0105 Gross per 24 hour  Intake 250 ml  Output --  Net 250 ml     Exam:  Constitutional:  The patient is awake, alert, and oriented x 3. Mild distress from discomfort. Respiratory:  No increased work of breathing. No wheezes, rales, or rhonchi No tactile fremitus Cardiovascular:  Regular rate and rhythm No murmurs, ectopy, or gallups. No lateral PMI. No thrills. Abdomen:  Abdomen is soft, non-tender, non-distended No hernias, masses, or organomegaly Normoactive bowel sounds.  Musculoskeletal:  Left lower extremity without cyanosis or clubbing. No erythema or warmth. Non-tender. Right lower extremity is warm, tender to touch and erythematous. Skin:  No rashes,  lesions, ulcers palpation of skin: no induration or nodules Erythema and warmth of right lower extremity. Neurologic:  CN 2-12 intact Sensation all 4 extremities intact Psychiatric:  Mental status Mood, affect appropriate Orientation to person, place, time  judgment and insight appear intact       Data Review:    Recent Labs  Lab 10/11/23 2231 10/12/23 1338 10/13/23 0652 10/14/23 0554 10/15/23 0512  WBC 38.4* 34.4* 24.6* 15.0* 10.6*  HGB 11.3* 10.6* 10.7* 9.0* 9.0*  HCT 34.4* 34.0* 34.5* 30.0* 29.8*  PLT 336 280 297 259 285  MCV 79.1* 83.5 82.5 83.8 84.4  MCH 26.0 26.0 25.6* 25.1* 25.5*  MCHC 32.8 31.2 31.0 30.0 30.2  RDW 14.4 14.4 14.8 14.6 14.4  LYMPHSABS 1.2 2.8 2.5 2.9 3.0  MONOABS 2.3* 1.4* 1.2* 1.1* 1.0  EOSABS 0.0 0.0 0.0 0.2 0.1  BASOSABS 0.0 0.1 0.1 0.0 0.0    Recent Labs  Lab 10/11/23 2231 10/11/23 2300 10/12/23 0023 10/12/23 0314 10/12/23 0557 10/12/23 0952 10/12/23 1338  10/13/23 0652 10/14/23 0554 10/15/23 0512  NA  --  130*  --   --   --   --  135 134* 135 135  K  --  3.6  --   --   --   --  3.4* 4.0 3.3* 3.1*  CL  --  99  --   --   --   --  102 102 103 103  CO2  --  23  --   --   --   --  23 23 25 23   ANIONGAP  --  8  --   --   --   --  10 9 7 9   GLUCOSE  --  374*  --   --   --   --  124* 225* 162* 163*  BUN  --  11  --   --   --   --  11 11 14 11   CREATININE  --  1.19*  --   --   --   --  1.01* 0.86 0.73 0.77  AST  --  16  --   --   --   --  21 18  --   --   ALT  --  15  --   --   --   --  17 18  --   --   ALKPHOS  --  68  --   --   --   --  59 82  --   --   BILITOT  --  0.6  --   --   --   --  0.5 0.5  --   --   ALBUMIN  --  2.8*  --   --   --   --  2.0* 2.1*  --   --   LATICACIDVEN 2.5*  --  2.6* 2.9* 1.9  --   --   --   --   --   HGBA1C  --   --   --   --   --  11.7*  --   --   --   --   MG  --   --   --   --   --   --  1.6* 2.2 1.9  --   CALCIUM  --  7.9*  --   --   --   --  7.7* 7.7* 7.5* 7.7*      Recent Labs  Lab  10/11/23 2231 10/11/23 2300 10/12/23 0023 10/12/23 0314 10/12/23 0557 10/12/23 0952 10/12/23 1338 10/13/23 0652 10/14/23 0554 10/15/23 0512  LATICACIDVEN 2.5*  --  2.6* 2.9* 1.9  --   --   --   --   --   HGBA1C  --   --   --   --   --  11.7*  --   --   --   --   MG  --   --   --   --   --   --  1.6* 2.2 1.9  --   CALCIUM  --  7.9*  --   --   --   --  7.7* 7.7* 7.5* 7.7*    --------------------------------------------------------------------------------------------------------------- Lab Results  Component Value Date   CHOL 143 10/14/2023   HDL 29 (L) 10/14/2023   LDLCALC 69 10/14/2023   LDLDIRECT 64.0 03/26/2023   TRIG 224 (H) 10/14/2023   CHOLHDL 4.9 10/14/2023    Lab Results  Component Value Date   HGBA1C  11.7 (H) 10/12/2023   No results for input(s): "TSH", "T4TOTAL", "FREET4", "T3FREE", "THYROIDAB" in the last 72 hours. No results for input(s): "VITAMINB12", "FOLATE", "FERRITIN", "TIBC", "IRON", "RETICCTPCT" in the last 72 hours. ------------------------------------------------------------------------------------------------------------------ Cardiac Enzymes No results for input(s): "CKMB", "TROPONINI", "MYOGLOBIN" in the last 168 hours.  Invalid input(s): "CK"  Micro Results Recent Results (from the past 240 hours)  Resp panel by RT-PCR (RSV, Flu A&B, Covid) Anterior Nasal Swab     Status: None   Collection Time: 10/11/23  4:58 PM   Specimen: Anterior Nasal Swab  Result Value Ref Range Status   SARS Coronavirus 2 by RT PCR NEGATIVE NEGATIVE Final    Comment: (NOTE) SARS-CoV-2 target nucleic acids are NOT DETECTED.  The SARS-CoV-2 RNA is generally detectable in upper respiratory specimens during the acute phase of infection. The lowest concentration of SARS-CoV-2 viral copies this assay can detect is 138 copies/mL. A negative result does not preclude SARS-Cov-2 infection and should not be used as the sole basis for treatment or other patient management  decisions. A negative result may occur with  improper specimen collection/handling, submission of specimen other than nasopharyngeal swab, presence of viral mutation(s) within the areas targeted by this assay, and inadequate number of viral copies(<138 copies/mL). A negative result must be combined with clinical observations, patient history, and epidemiological information. The expected result is Negative.  Fact Sheet for Patients:  BloggerCourse.com  Fact Sheet for Healthcare Providers:  SeriousBroker.it  This test is no t yet approved or cleared by the [place] FDA and  has been authorized for detection and/or diagnosis of SARS-CoV-2 by FDA under an Emergency Use Authorization (EUA). This EUA will remain  in effect (meaning this test can be used) for the duration of the COVID-19 declaration under Section 564(b)(1) of the Act, 21 U.S.C.section 360bbb-3(b)(1), unless the authorization is terminated  or revoked sooner.       Influenza A by PCR NEGATIVE NEGATIVE Final   Influenza B by PCR NEGATIVE NEGATIVE Final    Comment: (NOTE) The Xpert Xpress SARS-CoV-2/FLU/RSV plus assay is intended as an aid in the diagnosis of influenza from Nasopharyngeal swab specimens and should not be used as a sole basis for treatment. Nasal washings and aspirates are unacceptable for Xpert Xpress SARS-CoV-2/FLU/RSV testing.  Fact Sheet for Patients: BloggerCourse.com  Fact Sheet for Healthcare Providers: SeriousBroker.it  This test is not yet approved or cleared by the [place] FDA and has been authorized for detection and/or diagnosis of SARS-CoV-2 by FDA under an Emergency Use Authorization (EUA). This EUA will remain in effect (meaning this test can be used) for the duration of the COVID-19 declaration under Section 564(b)(1) of the Act, 21 U.S.C. section 360bbb-3(b)(1), unless the  authorization is terminated or revoked.     Resp Syncytial Virus by PCR NEGATIVE NEGATIVE Final    Comment: (NOTE) Fact Sheet for Patients: BloggerCourse.com  Fact Sheet for Healthcare Providers: SeriousBroker.it  This test is not yet approved or cleared by the [place] FDA and has been authorized for detection and/or diagnosis of SARS-CoV-2 by FDA under an Emergency Use Authorization (EUA). This EUA will remain in effect (meaning this test can be used) for the duration of the COVID-19 declaration under Section 564(b)(1) of the Act, 21 U.S.C. section 360bbb-3(b)(1), unless the authorization is terminated or revoked.  Performed at [facility], [address] [place], [place] [address]   Group A Strep by PCR     Status: None   Collection Time: 10/11/23  4:58 PM   Specimen: Anterior Nasal Swab; Sterile Swab  Result Value Ref Range Status   Group A Strep by PCR NOT DETECTED NOT DETECTED Final    Comment: Performed at [facility], [address], [place], [place] [address]  Blood culture (routine x 2)     Status: None (Preliminary result)   Collection Time: 10/11/23 11:00 PM   Specimen: BLOOD  Result Value Ref Range Status   Specimen Description   Final    BLOOD LEFT ANTECUBITAL Performed at [facility], [address], [place], [place] [address]    Special Requests   Final    BOTTLES DRAWN AEROBIC AND ANAEROBIC Blood Culture adequate volume Performed at [facility], [address] [place], [place] [address]    Culture   Final    NO GROWTH 3 DAYS Performed at [facility], [address] [place], [place] [address]    Report Status PENDING  Incomplete  Blood culture (routine x 2)     Status: None (Preliminary result)   Collection Time: 10/11/23 11:20 PM   Specimen: BLOOD  Result Value Ref Range Status   Specimen Description    Final    BLOOD BLOOD LEFT HAND Performed at [facility], [address] [place], [place] [address]    Special Requests   Final    BOTTLES DRAWN AEROBIC AND ANAEROBIC Blood Culture adequate volume Performed at [facility], [address] [place], [place] [address]    Culture   Final    NO GROWTH 3 DAYS Performed at [facility], [address] [place], [place] [address]    Report Status PENDING  Incomplete    Radiology Reports VAS [place] LOWER EXTREMITY VENOUS (DVT) Result Date: 10/14/2023  Lower Venous DVT Study Patient Name:  RILYN SCROGGS  Date of Exam:   10/13/2023 Medical Rec #: [id number]     Accession #:    [id number] Date of Birth: Oct 09, 1985     Patient Gender: F Patient Age:   42 years Exam Location:  [facility] Procedure:      VAS [place] LOWER EXTREMITY VENOUS (DVT) Referring Phys: PROSPER AMPONSAH --------------------------------------------------------------------------------  Indications: Pain.  Risk Factors: None identified. Limitations: Body habitus, poor ultrasound/tissue interface and patient pain tolerance. Comparison Study: No prior studies. Performing Technologist: Chanda Busing RVT  Examination Guidelines: A complete evaluation includes B-mode imaging, spectral Doppler, color Doppler, and power Doppler as needed of all accessible portions of each vessel. Bilateral testing is considered an integral part of a complete examination. Limited examinations for reoccurring indications may be performed as noted. The reflux portion of the exam is performed with the patient in reverse Trendelenburg.  +---------+---------------+---------+-----------+----------+-------------------+ RIGHT    CompressibilityPhasicitySpontaneityPropertiesThrombus Aging      +---------+---------------+---------+-----------+----------+-------------------+ CFV      Full           Yes      Yes                                       +---------+---------------+---------+-----------+----------+-------------------+ SFJ      Full                                                             +---------+---------------+---------+-----------+----------+-------------------+  FV Prox  Full                                                             +---------+---------------+---------+-----------+----------+-------------------+ FV Mid   Full                                                             +---------+---------------+---------+-----------+----------+-------------------+ FV DistalFull                                                             +---------+---------------+---------+-----------+----------+-------------------+ PFV      Full                                                             +---------+---------------+---------+-----------+----------+-------------------+ POP      Full           Yes      Yes                                      +---------+---------------+---------+-----------+----------+-------------------+ PTV                                                   Not well visualized +---------+---------------+---------+-----------+----------+-------------------+ PERO                                                  Not well visualized +---------+---------------+---------+-----------+----------+-------------------+   +----+---------------+---------+-----------+----------+--------------+ LEFTCompressibilityPhasicitySpontaneityPropertiesThrombus Aging +----+---------------+---------+-----------+----------+--------------+ CFV Full           Yes      Yes                                 +----+---------------+---------+-----------+----------+--------------+     Summary: RIGHT: - There is no evidence of deep vein thrombosis in the lower extremity. However, portions of this examination were limited- see technologist comments above.  - No cystic structure found in the  popliteal fossa.  LEFT: - No evidence of common femoral vein obstruction.   *See table(s) above for measurements and observations. Electronically signed by Carolynn Sayers on 10/14/2023 at 9:11:38 AM.    Final    CT TIBIA FIBULA RIGHT WO CONTRAST Result Date: 10/12/2023 CLINICAL DATA:  Necrotizing fasciitis suspected. Foot x-ray done. History of nec fasc. Leukocytosis. EXAM: CT OF THE LOWER RIGHT EXTREMITY WITHOUT CONTRAST  TECHNIQUE: Multidetector CT imaging of the right lower extremity was performed according to the standard protocol. RADIATION DOSE REDUCTION: This exam was performed according to the departmental dose-optimization program which includes automated exposure control, adjustment of the mA and/or kV according to patient size and/or use of iterative reconstruction technique. COMPARISON:  Same day radiographs FINDINGS: Bones/Joint/Cartilage No acute fracture or dislocation. No knee joint effusion. No evidence of osteomyelitis. Ligaments Suboptimally assessed by CT. Muscles and Tendons No evidence of deep tissue infection. Soft tissues Bilateral inguinal lymphadenopathy. 1.9 cm left inguinal lymph node on series 5/image 57 and 2.0 cm right inguinal node on 5/63. There is perilymphatic fat stranding about the right inguinal lymph nodes. Additional enlarged right external iliac node (series 5/image 42) measuring 13 mm. Subcutaneous edema about the right lower extremity greatest in the calf. No soft tissue gas. No abscess. IMPRESSION: 1. Subcutaneous edema about the right lower extremity greatest in the calf is nonspecific but can be seen with cellulitis. No soft tissue gas. No abscess. 2. Bilateral inguinal lymphadenopathy with perilymphatic fat stranding about the right inguinal lymph nodes. Additional enlarged right external iliac node. These are likely reactive. Electronically Signed   By: Minerva Fester M.D.   On: 10/12/2023 00:12   CT FEMUR RIGHT WO CONTRAST Result Date: 10/12/2023 CLINICAL DATA:   Necrotizing fasciitis suspected. Foot x-ray done. History of nec fasc. Leukocytosis. EXAM: CT OF THE LOWER RIGHT EXTREMITY WITHOUT CONTRAST TECHNIQUE: Multidetector CT imaging of the right lower extremity was performed according to the standard protocol. RADIATION DOSE REDUCTION: This exam was performed according to the departmental dose-optimization program which includes automated exposure control, adjustment of the mA and/or kV according to patient size and/or use of iterative reconstruction technique. COMPARISON:  Same day radiographs FINDINGS: Bones/Joint/Cartilage No acute fracture or dislocation. No knee joint effusion. No evidence of osteomyelitis. Ligaments Suboptimally assessed by CT. Muscles and Tendons No evidence of deep tissue infection. Soft tissues Bilateral inguinal lymphadenopathy. 1.9 cm left inguinal lymph node on series 5/image 57 and 2.0 cm right inguinal node on 5/63. There is perilymphatic fat stranding about the right inguinal lymph nodes. Additional enlarged right external iliac node (series 5/image 42) measuring 13 mm. Subcutaneous edema about the right lower extremity greatest in the calf. No soft tissue gas. No abscess. IMPRESSION: 1. Subcutaneous edema about the right lower extremity greatest in the calf is nonspecific but can be seen with cellulitis. No soft tissue gas. No abscess. 2. Bilateral inguinal lymphadenopathy with perilymphatic fat stranding about the right inguinal lymph nodes. Additional enlarged right external iliac node. These are likely reactive. Electronically Signed   By: Minerva Fester M.D.   On: 10/12/2023 00:12   DG Tibia/Fibula Right Result Date: 10/11/2023 CLINICAL DATA:  Leg pain EXAM: RIGHT TIBIA AND FIBULA - 2 VIEW COMPARISON:  None Available. FINDINGS: No fracture or malalignment. Extensive subcutaneous edema. No osseous erosive change IMPRESSION: No acute osseous abnormality Electronically Signed   By: Jasmine Pang M.D.   On: 10/11/2023 23:00   DG  Femur Min 2 Views Right Result Date: 10/11/2023 CLINICAL DATA:  Leg pain fever EXAM: RIGHT FEMUR 2 VIEWS COMPARISON:  None Available. FINDINGS: There is no evidence of fracture or other focal bone lesions. Soft tissues are unremarkable. IMPRESSION: Negative. Electronically Signed   By: Jasmine Pang M.D.   On: 10/11/2023 22:59   DG Chest 2 View Result Date: 10/11/2023 CLINICAL DATA:  Fever and flu-like symptoms. EXAM: CHEST - 2 VIEW COMPARISON:  04/28/2023 FINDINGS: Stable cardiomegaly.  Pulmonary vascular congestion. Left mid lung atelectasis/scarring. The lungs are otherwise clear. No pleural effusion or pneumothorax. IMPRESSION: Cardiomegaly and pulmonary vascular congestion. Electronically Signed   By: Minerva Fester M.D.   On: 10/11/2023 20:17      Signature  -   Fran Lowes M.D on 10/15/2023 at 6:42 PM   -  To page go to [email or web address]

## 2023-10-15 NOTE — Plan of Care (Signed)
  Problem: Education: Goal: Ability to describe self-care measures that may prevent or decrease complications (Diabetes Survival Skills Education) will improve Outcome: Progressing Goal: Individualized Educational Video(s) Outcome: Progressing   Problem: Coping: Goal: Ability to adjust to condition or change in health will improve Outcome: Progressing   Problem: Fluid Volume: Goal: Ability to maintain a balanced intake and output will improve Outcome: Progressing   Problem: Health Behavior/Discharge Planning: Goal: Ability to identify and utilize available resources and services will improve Outcome: Progressing Goal: Ability to manage health-related needs will improve Outcome: Progressing   Problem: Metabolic: Goal: Ability to maintain appropriate glucose levels will improve Outcome: Progressing   Problem: Nutritional: Goal: Maintenance of adequate nutrition will improve Outcome: Progressing Goal: Progress toward achieving an optimal weight will improve Outcome: Progressing   Problem: Skin Integrity: Goal: Risk for impaired skin integrity will decrease Outcome: Progressing   Problem: Tissue Perfusion: Goal: Adequacy of tissue perfusion will improve Outcome: Progressing   Problem: Education: Goal: Knowledge of General Education information will improve Description: Including pain rating scale, medication(s)/side effects and non-pharmacologic comfort measures Outcome: Progressing   Problem: Health Behavior/Discharge Planning: Goal: Ability to manage health-related needs will improve Outcome: Progressing   Problem: Clinical Measurements: Goal: Ability to maintain clinical measurements within normal limits will improve Outcome: Progressing Goal: Will remain free from infection Outcome: Progressing Goal: Diagnostic test results will improve Outcome: Progressing Goal: Respiratory complications will improve Outcome: Progressing Goal: Cardiovascular complication will  be avoided Outcome: Progressing   Problem: Activity: Goal: Risk for activity intolerance will decrease Outcome: Progressing   Problem: Nutrition: Goal: Adequate nutrition will be maintained Outcome: Progressing   Problem: Coping: Goal: Level of anxiety will decrease Outcome: Progressing   Problem: Elimination: Goal: Will not experience complications related to bowel motility Outcome: Progressing Goal: Will not experience complications related to urinary retention Outcome: Progressing   Problem: Pain Managment: Goal: General experience of comfort will improve and/or be controlled Outcome: Progressing   Problem: Safety: Goal: Ability to remain free from injury will improve Outcome: Progressing   Problem: Skin Integrity: Goal: Risk for impaired skin integrity will decrease Outcome: Progressing   Problem: Education: Goal: Ability to describe self-care measures that may prevent or decrease complications (Diabetes Survival Skills Education) will improve Outcome: Progressing Goal: Individualized Educational Video(s) Outcome: Progressing   Problem: Coping: Goal: Ability to adjust to condition or change in health will improve Outcome: Progressing   Problem: Fluid Volume: Goal: Ability to maintain a balanced intake and output will improve Outcome: Progressing   Problem: Health Behavior/Discharge Planning: Goal: Ability to identify and utilize available resources and services will improve Outcome: Progressing Goal: Ability to manage health-related needs will improve Outcome: Progressing   Problem: Metabolic: Goal: Ability to maintain appropriate glucose levels will improve Outcome: Progressing   Problem: Nutritional: Goal: Maintenance of adequate nutrition will improve Outcome: Progressing Goal: Progress toward achieving an optimal weight will improve Outcome: Progressing   Problem: Skin Integrity: Goal: Risk for impaired skin integrity will decrease Outcome:  Progressing   Problem: Tissue Perfusion: Goal: Adequacy of tissue perfusion will improve Outcome: Progressing   Problem: Clinical Measurements: Goal: Ability to avoid or minimize complications of infection will improve Outcome: Progressing   Problem: Skin Integrity: Goal: Skin integrity will improve Outcome: Progressing

## 2023-10-15 NOTE — Inpatient Diabetes Management (Signed)
Inpatient Diabetes Program Recommendations  AACE/ADA: New Consensus Statement on Inpatient Glycemic Control (2015)  Target Ranges:  Prepandial:   less than 140 mg/dL      Peak postprandial:   less than 180 mg/dL (1-2 hours)      Critically ill patients:  140 - 180 mg/dL   Lab Results  Component Value Date   GLUCAP 152 (H) 10/15/2023   HGBA1C 11.7 (H) 10/12/2023    Review of Glycemic Control  Latest Reference Range & Units 10/14/23 07:37 10/14/23 11:13 10/14/23 16:22 10/14/23 21:00 10/15/23 08:11 10/15/23 11:40  Glucose-Capillary 70 - 99 mg/dL 295 (H) 621 (H) 308 (H) 163 (H) 128 (H) 152 (H)  (H): Data is abnormally high  Diabetes history: DM2 Outpatient Diabetes medications: Lantus 25 units at bedtime, Humalog 10 units TID, Metformin XR 1000 mg BID, Decom G7 Current orders for Inpatient glycemic control: Semglee 30 units every day, Novolog 0-20 units TID, Metfromin XR 1000 mg BID  Inpatient Diabetes Program Recommendations:    Will need affordable insulin at discharge.  Novolog 70/30 20 units BID (this is 28 units basal insulin every day & 6 units meal coverage BID)  Could start this on 1/28.  Met with patient at bedside.  She has not taken any insulin since October when she lost her job and insurance.  Reviewed patient's current A1c of 11.7%. Explained what a A1c is and what it measures. Also reviewed goal A1c with patient, importance of good glucose control @ home, and blood sugar goals.  She has a glucometer at home.  Explained importance of glucose control to help avoid long and short term complications of uncontrolled glucose including healing.  Educated on The Plate Method, CHO's, portion control, CBGs at home fasting and mid afternoon, F/U with PCP every 3 months, bring meter to PCP office, and importance of exercise.  Will consult TOC for assistance with PCP maybe with [facility]?  Explained [employer] brand ReliOn Novolin 70/30 can be purchased OTC without a prescription for $43 for a  box of 5 insulin pens.    Will continue to follow while inpatient.  Thank you, Dulce Sellar, MSN, CDCES Diabetes Coordinator Inpatient Diabetes Program [phone] (team pager from 8a-5p)

## 2023-10-16 LAB — GLUCOSE, CAPILLARY
Glucose-Capillary: 122 mg/dL — ABNORMAL HIGH (ref 70–99)
Glucose-Capillary: 224 mg/dL — ABNORMAL HIGH (ref 70–99)
Glucose-Capillary: 120 mg/dL — ABNORMAL HIGH (ref 70–99)
Glucose-Capillary: 175 mg/dL — ABNORMAL HIGH (ref 70–99)
Glucose-Capillary: 148 mg/dL — ABNORMAL HIGH (ref 70–99)

## 2023-10-16 LAB — BASIC METABOLIC PANEL
Anion gap: 9 (ref 5–15)
BUN: 11 mg/dL (ref 6–20)
CO2: 23 mmol/L (ref 22–32)
Calcium: 7.9 mg/dL — ABNORMAL LOW (ref 8.9–10.3)
Chloride: 102 mmol/L (ref 98–111)
Creatinine, Ser: 0.82 mg/dL (ref 0.44–1.00)
GFR, Estimated: >60 mL/min (ref >60)
Glucose, Bld: 245 mg/dL — ABNORMAL HIGH (ref 70–99)
Potassium: 3.2 mmol/L — ABNORMAL LOW (ref 3.5–5.1)
Sodium: 134 mmol/L — ABNORMAL LOW (ref 135–145)

## 2023-10-16 LAB — CBC WITH DIFFERENTIAL/PLATELET
Abs Immature Granulocytes: 0.07 10*3/uL (ref 0.00–0.07)
Basophils Absolute: 0 10*3/uL (ref 0.0–0.1)
Basophils Relative: 0 %
Eosinophils Absolute: 0.1 10*3/uL (ref 0.0–0.5)
Eosinophils Relative: 1 %
HCT: 32.5 % — ABNORMAL LOW (ref 36.0–46.0)
Hemoglobin: 9.9 g/dL — ABNORMAL LOW (ref 12.0–15.0)
Immature Granulocytes: 1 %
Lymphocytes Relative: 23 %
Lymphs Abs: 2.5 10*3/uL (ref 0.7–4.0)
MCH: 25.4 pg — ABNORMAL LOW (ref 26.0–34.0)
MCHC: 30.5 g/dL (ref 30.0–36.0)
MCV: 83.5 fL (ref 80.0–100.0)
Monocytes Absolute: 0.9 10*3/uL (ref 0.1–1.0)
Monocytes Relative: 8 %
Neutro Abs: 7.2 10*3/uL (ref 1.7–7.7)
Neutrophils Relative %: 67 %
Platelets: 319 10*3/uL (ref 150–400)
RBC: 3.89 MIL/uL (ref 3.87–5.11)
RDW: 14.4 % (ref 11.5–15.5)
WBC: 10.7 10*3/uL — ABNORMAL HIGH (ref 4.0–10.5)
nRBC: 0 % (ref 0.0–0.2)

## 2023-10-16 MED ORDER — POTASSIUM CHLORIDE CRYS ER 20 MEQ PO TBCR
40.0000 meq | EXTENDED_RELEASE_TABLET | Freq: Two times a day (BID) | ORAL | Status: AC
  Administered 2023-10-16 (×2): 40 meq via ORAL
  Filled 2023-10-16 (×2): qty 2

## 2023-10-16 MED ORDER — CARVEDILOL 25 MG PO TABS
37.5000 mg | ORAL_TABLET | Freq: Two times a day (BID) | ORAL | Status: DC
  Administered 2023-10-16 – 2023-10-21 (×10): 37.5 mg via ORAL
  Filled 2023-10-16 (×10): qty 1

## 2023-10-16 NOTE — Progress Notes (Signed)
PROGRESS NOTE                                                                                                                                                                                                             Patient Demographics:    Carolyn Zimmerman, is a 38 y.o. female, DOB - [birth year], BMW:[id number]  Outpatient Primary MD for the patient is Ivonne Andrew, NP    LOS - 4  Admit date - 10/11/2023    Chief Complaint  Patient presents with   Nausea       Brief Narrative (HPI from H&P)   Carolyn Zimmerman is a 38 y.o. female with medical history significant of anxiety, depression, coronavirus infection, left MCA watershed CVA, hyperlipidemia, hypertension, hypertensive heart disease, chronic combined systolic heart failure with EF of 40 to 45% in June 2024, jaundice, class III obesity, tobacco use, history of acute pancreatitis, insulin-dependent type 2 diabetes,  tubo-ovarian abscess, hidradenitis suppurativa, chest wall abscesses, history of sepsis secondary to necrotizing fasciitis due to panniculitis with abdominal wall abscess requiring panniculectomy who presented to emergency department with nausea, vomiting, chills, fever, malaise and RLE tenderness with erythema/edema and admitted for sepsis secondary to cellulitis.  The patient was lying quietly in bed upon my visit. She states that her pain continues to improve. Dilaudid was discontinued yesterday, and she was transitioned to oxycodone for pain.    Subjective:   Carolyn Zimmerman No new complaints. She states that the pain is improved as has the swelling.   Assessment  & Plan :    Assessment and Plan: Sepsis secondary to cellulitis - Resolved. The patient presented on 10/11/2023 with fever of with temp greater than 100.2, a greater than 20 mmHg drop in systolic blood pressure, tachypnea, and tachycardia in the setting of cellulitis of the right lower extremity. WBC  was 34.4.Marland Kitchen  RLE cellulitis -The right lower extremity continues to improve. -Continue IV Rocephin -WBC has decreased from 15.0 to 10.7.Marland Kitchen -RLE ultrasound has ruled out DVT -WBC, fever curve are diminishing. -Pain is controlled on oxycodone.   T2DM, uncontrolled A1c of 11.7% on admission in the setting of not taking any insulin or diabetes medication over the last 3 months.  -Blood sugars have been well controlled over the past 24 hours. -Resume home metformin -Increase Semglee from 25 to 30 units daily -  Continue SSI with meals, CBG monitoring -Diabetic educator visited the patient today.  HTN SBP remains elevated yesterday.   The Coreg was increased to 37.5 bid. Monitor. Start irbesartan 300 mg daily (substitute for home valsartan 320 mg)  HFrEF Last TTE in June 2024 showed EF 40-45%, LV global hypokinesis, moderate eccentric LVH, G2DD, and severely dilated LA. patient has received 7.5 L of fluid since admission with urine output of Foley 1.5 L.  She is not in respiratory distress but is slightly fluid overloaded.  GDMT previously included losartan, spironolactone, Coreg and torsemide however patient has not taken these medications for the last 3 months. -Telemetry -Resume torsemide 40 mg daily -Continue beta-blocker and ARB as above -Trend and replete electrolytes -Strict I&O's, daily weights. Likely not accurate. According to this data the patient is positive 7 liters since admission.   Bilateral onychomycosis Poor podiatric hygiene Severe callouses with cracking of soles and heels The patient asked how she got the cellulitis and the infection that caused it. I examined her feet. While her toenails have been painted, it is easy to see the debris of fungal infection under the toenails from the underside of her feet. Her feet are dirty with severe callouses and deep cracks in her heels and her soles. I discussed with the patient the importance of caring for her feet diligently and  consistently each morning and night. She was also admonished to wear hard soled shoes at all times. Once she is recovered from this infection, she should be started on lamisil by her PCP. Perhaps she should start to see podiatry to maintain the health of her feet. I also exhorted the importance of keeping her glucoses under control.  HLD Hx CVA -Resume aspirin and rosuvastatin -Check lipid panel  Tobacco use disorder -Continue nicotine patch  Hypomagnesemia, resolved Hypokalemia, resolved Hypophosphatemia, continue phosphorus tablets, follow-up repeat phosphorus tomorrow  Social determinants of health Reports financial difficulties and food insecurity.  She has had difficulty affording her medications over the last 3 months. -TOC consulted, appreciate assistance        Morbid obesity: Estimated body mass index is 46.92 kg/m as calculated from the following:   Height as of this encounter: 5\' 7"  (1.702 m).   Weight as of this encounter: 135.9 kg.  -Follow-up with PCP for nutrition and weight loss counseling        Condition -improving  Family Communication  : None  Code Status : Full code  Consults  : None  PUD Prophylaxis : None   Procedures  :     None      Disposition Plan  :    Status is: Inpatient Remains inpatient appropriate because: Treatment for cellulitis, RLE swelling and pain  DVT Prophylaxis  :  Lovenox    Lab Results  Component Value Date   PLT 319 10/16/2023    Diet :  Diet Order             Diet heart healthy/carb modified Fluid consistency: Thin; Fluid restriction: 1500 mL Fluid  Diet effective now                    Inpatient Medications  Scheduled Meds:  aspirin  81 mg Oral Daily   carvedilol  37.5 mg Oral BID WC   enoxaparin (LOVENOX) injection  60 mg Subcutaneous Q24H   insulin aspart  0-20 Units Subcutaneous TID WC   insulin glargine-yfgn  30 Units Subcutaneous Daily   irbesartan  300 mg  Oral Daily   metFORMIN   1,000 mg Oral BID WC   nicotine  21 mg Transdermal Daily   potassium chloride  40 mEq Oral BID   rosuvastatin  20 mg Oral Daily   torsemide  40 mg Oral Daily   Continuous Infusions:  cefTRIAXone (ROCEPHIN)  IV 2 g (10/16/23 0934)   PRN Meds:.acetaminophen **OR** acetaminophen, ibuprofen, ondansetron **OR** ondansetron (ZOFRAN) IV, oxyCODONE, prochlorperazine  Antibiotics  :    Anti-infectives (From admission, onward)    Start     Dose/Rate Route Frequency Ordered Stop   10/12/23 0800  cefTRIAXone (ROCEPHIN) 2 g in sodium chloride 0.9 % 100 mL IVPB        2 g 200 mL/hr over 30 Minutes Intravenous Every 24 hours 10/12/23 0754     10/12/23 0030  vancomycin (VANCOCIN) IVPB 1000 mg/200 mL premix       Placed in "Followed by" Linked Group   1,000 mg 200 mL/hr over 60 Minutes Intravenous  Once 10/11/23 2317 10/12/23 0302   10/11/23 2330  vancomycin (VANCOCIN) IVPB 1000 mg/200 mL premix       Placed in "Followed by" Linked Group   1,000 mg 200 mL/hr over 60 Minutes Intravenous  Once 10/11/23 2317 10/12/23 0139   10/11/23 2300  ceFEPIme (MAXIPIME) 2 g in sodium chloride 0.9 % 100 mL IVPB        2 g 200 mL/hr over 30 Minutes Intravenous  Once 10/11/23 2258 10/12/23 0035   10/11/23 2300  metroNIDAZOLE (FLAGYL) IVPB 500 mg        500 mg 100 mL/hr over 60 Minutes Intravenous  Once 10/11/23 2258 10/12/23 0139   10/11/23 2300  vancomycin (VANCOCIN) IVPB 1000 mg/200 mL premix  Status:  Discontinued        1,000 mg 200 mL/hr over 60 Minutes Intravenous  Once 10/11/23 2258 10/11/23 2314         Objective:   Vitals:   10/16/23 0500 10/16/23 0753 10/16/23 1247 10/16/23 1622  BP:  (!) 176/87 (!) 175/88 (!) 181/88  Pulse:  75 74 77  Resp:  18 18 18   Temp:  97.8 F (36.6 C) 98.5 F (36.9 C) 98.3 F (36.8 C)  TempSrc:      SpO2:  98% 99% 98%  Weight: 135.9 kg     Height:        Wt Readings from Last 3 Encounters:  10/16/23 135.9 kg  10/04/23 126.1 kg  05/31/23 126 kg      Intake/Output Summary (Last 24 hours) at 10/16/2023 1739 Last data filed at 10/16/2023 0600 Gross per 24 hour  Intake 480 ml  Output --  Net 480 ml     Exam:  Constitutional:  The patient is awake, alert, and oriented x 3. Mild distress from discomfort. Respiratory:  No increased work of breathing. No wheezes, rales, or rhonchi No tactile fremitus Cardiovascular:  Regular rate and rhythm No murmurs, ectopy, or gallups. No lateral PMI. No thrills. Abdomen:  Abdomen is soft, non-tender, non-distended No hernias, masses, or organomegaly Normoactive bowel sounds.  Musculoskeletal:  Left lower extremity without cyanosis or clubbing. No erythema or warmth. Non-tender. Right lower extremity is less warm, less tender to touch and much less erythematous today Patient states that pain is improved. Skin:  No rashes, lesions, ulcers palpation of skin: no induration or nodules Erythema and warmth of right lower extremity is improving daily. Toenails are painted on feet bilaterally. There is visible debris of fungal infection in the  space beneath her toenails.  Soles and heels of the patient's feet are filthy, badly calloused with deep cracks in soles and heels. Neurologic:  CN 2-12 intact Sensation all 4 extremities intact Psychiatric:  Mental status Mood, affect appropriate Orientation to person, place, time  judgment and insight appear intact       Data Review:    Recent Labs  Lab 10/12/23 1338 10/13/23 0652 10/14/23 0554 10/15/23 0512 10/16/23 0609  WBC 34.4* 24.6* 15.0* 10.6* 10.7*  HGB 10.6* 10.7* 9.0* 9.0* 9.9*  HCT 34.0* 34.5* 30.0* 29.8* 32.5*  PLT 280 297 259 285 319  MCV 83.5 82.5 83.8 84.4 83.5  MCH 26.0 25.6* 25.1* 25.5* 25.4*  MCHC 31.2 31.0 30.0 30.2 30.5  RDW 14.4 14.8 14.6 14.4 14.4  LYMPHSABS 2.8 2.5 2.9 3.0 2.5  MONOABS 1.4* 1.2* 1.1* 1.0 0.9  EOSABS 0.0 0.0 0.2 0.1 0.1  BASOSABS 0.1 0.1 0.0 0.0 0.0    Recent Labs  Lab 10/11/23 2231  10/11/23 2300 10/11/23 2300 10/12/23 0023 10/12/23 0314 10/12/23 0557 10/12/23 0952 10/12/23 1338 10/13/23 0652 10/14/23 0554 10/15/23 0512 10/16/23 0609  NA  --  130*   < >  --   --   --   --  135 134* 135 135 134*  K  --  3.6   < >  --   --   --   --  3.4* 4.0 3.3* 3.1* 3.2*  CL  --  99   < >  --   --   --   --  102 102 103 103 102  CO2  --  23   < >  --   --   --   --  23 23 25 23 23   ANIONGAP  --  8   < >  --   --   --   --  10 9 7 9 9   GLUCOSE  --  374*   < >  --   --   --   --  124* 225* 162* 163* 245*  BUN  --  11   < >  --   --   --   --  11 11 14 11 11   CREATININE  --  1.19*   < >  --   --   --   --  1.01* 0.86 0.73 0.77 0.82  AST  --  16  --   --   --   --   --  21 18  --   --   --   ALT  --  15  --   --   --   --   --  17 18  --   --   --   ALKPHOS  --  68  --   --   --   --   --  59 82  --   --   --   BILITOT  --  0.6  --   --   --   --   --  0.5 0.5  --   --   --   ALBUMIN  --  2.8*  --   --   --   --   --  2.0* 2.1*  --   --   --   LATICACIDVEN 2.5*  --   --  2.6* 2.9* 1.9  --   --   --   --   --   --   HGBA1C  --   --   --   --   --   --  11.7*  --   --   --   --   --   MG  --   --   --   --   --   --   --  1.6* 2.2 1.9  --   --   CALCIUM  --  7.9*   < >  --   --   --   --  7.7* 7.7* 7.5* 7.7* 7.9*   < > = values in this interval not displayed.      Recent Labs  Lab 10/11/23 2231 10/11/23 2300 10/12/23 0023 10/12/23 0314 10/12/23 0557 10/12/23 5409 10/12/23 1338 10/13/23 0652 10/14/23 0554 10/15/23 0512 10/16/23 0609  LATICACIDVEN 2.5*  --  2.6* 2.9* 1.9  --   --   --   --   --   --   HGBA1C  --   --   --   --   --  11.7*  --   --   --   --   --   MG  --   --   --   --   --   --  1.6* 2.2 1.9  --   --   CALCIUM  --    < >  --   --   --   --  7.7* 7.7* 7.5* 7.7* 7.9*   < > = values in this interval not displayed.    --------------------------------------------------------------------------------------------------------------- Lab Results  Component  Value Date   CHOL 143 10/14/2023   HDL 29 (L) 10/14/2023   LDLCALC 69 10/14/2023   LDLDIRECT 64.0 03/26/2023   TRIG 224 (H) 10/14/2023   CHOLHDL 4.9 10/14/2023    Lab Results  Component Value Date   HGBA1C 11.7 (H) 10/12/2023   No results for input(s): "TSH", "T4TOTAL", "FREET4", "T3FREE", "THYROIDAB" in the last 72 hours. No results for input(s): "VITAMINB12", "FOLATE", "FERRITIN", "TIBC", "IRON", "RETICCTPCT" in the last 72 hours. ------------------------------------------------------------------------------------------------------------------ Cardiac Enzymes No results for input(s): "CKMB", "TROPONINI", "MYOGLOBIN" in the last 168 hours.  Invalid input(s): "CK"  Micro Results Recent Results (from the past 240 hours)  Resp panel by RT-PCR (RSV, Flu A&B, Covid) Anterior Nasal Swab     Status: None   Collection Time: 10/11/23  4:58 PM   Specimen: Anterior Nasal Swab  Result Value Ref Range Status   SARS Coronavirus 2 by RT PCR NEGATIVE NEGATIVE Final    Comment: (NOTE) SARS-CoV-2 target nucleic acids are NOT DETECTED.  The SARS-CoV-2 RNA is generally detectable in upper respiratory specimens during the acute phase of infection. The lowest concentration of SARS-CoV-2 viral copies this assay can detect is 138 copies/mL. A negative result does not preclude SARS-Cov-2 infection and should not be used as the sole basis for treatment or other patient management decisions. A negative result may occur with  improper specimen collection/handling, submission of specimen other than nasopharyngeal swab, presence of viral mutation(s) within the areas targeted by this assay, and inadequate number of viral copies(<138 copies/mL). A negative result must be combined with clinical observations, patient history, and epidemiological information. The expected result is Negative.  Fact Sheet for Patients:  BloggerCourse.com  Fact Sheet for Healthcare Providers:   SeriousBroker.it  This test is no t yet approved or cleared by the [place] FDA and  has been authorized for detection and/or diagnosis of SARS-CoV-2 by FDA under an Emergency Use Authorization (EUA). This EUA will remain  in effect (meaning this test can be used) for the duration of the COVID-19  declaration under Section 564(b)(1) of the Act, 21 U.S.C.section 360bbb-3(b)(1), unless the authorization is terminated  or revoked sooner.       Influenza A by PCR NEGATIVE NEGATIVE Final   Influenza B by PCR NEGATIVE NEGATIVE Final    Comment: (NOTE) The Xpert Xpress SARS-CoV-2/FLU/RSV plus assay is intended as an aid in the diagnosis of influenza from Nasopharyngeal swab specimens and should not be used as a sole basis for treatment. Nasal washings and aspirates are unacceptable for Xpert Xpress SARS-CoV-2/FLU/RSV testing.  Fact Sheet for Patients: BloggerCourse.com  Fact Sheet for Healthcare Providers: SeriousBroker.it  This test is not yet approved or cleared by the [place] FDA and has been authorized for detection and/or diagnosis of SARS-CoV-2 by FDA under an Emergency Use Authorization (EUA). This EUA will remain in effect (meaning this test can be used) for the duration of the COVID-19 declaration under Section 564(b)(1) of the Act, 21 U.S.C. section 360bbb-3(b)(1), unless the authorization is terminated or revoked.     Resp Syncytial Virus by PCR NEGATIVE NEGATIVE Final    Comment: (NOTE) Fact Sheet for Patients: BloggerCourse.com  Fact Sheet for Healthcare Providers: SeriousBroker.it  This test is not yet approved or cleared by the [place] FDA and has been authorized for detection and/or diagnosis of SARS-CoV-2 by FDA under an Emergency Use Authorization (EUA). This EUA will remain in effect (meaning this test can be used) for  the duration of the COVID-19 declaration under Section 564(b)(1) of the Act, 21 U.S.C. section 360bbb-3(b)(1), unless the authorization is terminated or revoked.  Performed at [facility], [address], [place], [place] [address]   Group A Strep by PCR     Status: None   Collection Time: 10/11/23  4:58 PM   Specimen: Anterior Nasal Swab; Sterile Swab  Result Value Ref Range Status   Group A Strep by PCR NOT DETECTED NOT DETECTED Final    Comment: Performed at [facility], [address] [place], [place] [address]  Blood culture (routine x 2)     Status: None (Preliminary result)   Collection Time: 10/11/23 11:00 PM   Specimen: BLOOD  Result Value Ref Range Status   Specimen Description   Final    BLOOD LEFT ANTECUBITAL Performed at [facility], [address], [place], [place] [address]    Special Requests   Final    BOTTLES DRAWN AEROBIC AND ANAEROBIC Blood Culture adequate volume Performed at [facility], [address], [place], [place] [address]    Culture   Final    NO GROWTH 4 DAYS Performed at [facility], [address], [place], [place] [address]    Report Status PENDING  Incomplete  Blood culture (routine x 2)     Status: None (Preliminary result)   Collection Time: 10/11/23 11:20 PM   Specimen: BLOOD  Result Value Ref Range Status   Specimen Description   Final    BLOOD BLOOD LEFT HAND Performed at [facility], [address], [place], [place] [address]    Special Requests   Final    BOTTLES DRAWN AEROBIC AND ANAEROBIC Blood Culture adequate volume Performed at [facility], [address] [place], [place] [address]    Culture   Final    NO GROWTH 4 DAYS Performed at [facility], [address] [place], [place] [address]    Report Status PENDING  Incomplete    Radiology Reports VAS [place] LOWER EXTREMITY VENOUS  (  DVT) Result Date: 10/14/2023  Lower Venous DVT Study Patient Name:  CHELLSIE GOMER  Date of Exam:   10/13/2023 Medical Rec #: [id number]     Accession #:    [id number] Date of Birth: [birth year]     Patient Gender: F Patient Age:   89 years Exam Location:  [facility] Procedure:      VAS [place] LOWER EXTREMITY VENOUS (DVT) Referring Phys: PROSPER AMPONSAH --------------------------------------------------------------------------------  Indications: Pain.  Risk Factors: None identified. Limitations: Body habitus, poor ultrasound/tissue interface and patient pain tolerance. Comparison Study: No prior studies. Performing Technologist: Chanda Busing RVT  Examination Guidelines: A complete evaluation includes B-mode imaging, spectral Doppler, color Doppler, and power Doppler as needed of all accessible portions of each vessel. Bilateral testing is considered an integral part of a complete examination. Limited examinations for reoccurring indications may be performed as noted. The reflux portion of the exam is performed with the patient in reverse Trendelenburg.  +---------+---------------+---------+-----------+----------+-------------------+ RIGHT    CompressibilityPhasicitySpontaneityPropertiesThrombus Aging      +---------+---------------+---------+-----------+----------+-------------------+ CFV      Full           Yes      Yes                                      +---------+---------------+---------+-----------+----------+-------------------+ SFJ      Full                                                             +---------+---------------+---------+-----------+----------+-------------------+ FV Prox  Full                                                             +---------+---------------+---------+-----------+----------+-------------------+ FV Mid   Full                                                              +---------+---------------+---------+-----------+----------+-------------------+ FV DistalFull                                                             +---------+---------------+---------+-----------+----------+-------------------+ PFV      Full                                                             +---------+---------------+---------+-----------+----------+-------------------+ POP      Full           Yes      Yes                                      +---------+---------------+---------+-----------+----------+-------------------+  PTV                                                   Not well visualized +---------+---------------+---------+-----------+----------+-------------------+ PERO                                                  Not well visualized +---------+---------------+---------+-----------+----------+-------------------+   +----+---------------+---------+-----------+----------+--------------+ LEFTCompressibilityPhasicitySpontaneityPropertiesThrombus Aging +----+---------------+---------+-----------+----------+--------------+ CFV Full           Yes      Yes                                 +----+---------------+---------+-----------+----------+--------------+     Summary: RIGHT: - There is no evidence of deep vein thrombosis in the lower extremity. However, portions of this examination were limited- see technologist comments above.  - No cystic structure found in the popliteal fossa.  LEFT: - No evidence of common femoral vein obstruction.   *See table(s) above for measurements and observations. Electronically signed by Carolynn Sayers on 10/14/2023 at 9:11:38 AM.    Final       Signature  -   Fran Lowes M.D on 10/16/2023 at 5:39 PM   -  To page go to [email or web address]

## 2023-10-16 NOTE — Plan of Care (Signed)
Problem: Education: Goal: Ability to describe self-care measures that may prevent or decrease complications (Diabetes Survival Skills Education) will improve Outcome: Progressing Goal: Individualized Educational Video(s) Outcome: Progressing

## 2023-10-16 NOTE — TOC Progression Note (Signed)
Transition of Care [employer] [facility]) - Progression Note    Patient Details  Name: Carolyn Zimmerman MRN: [id number] Date of Birth: [birth year]  [facility]) CM/SW Contact  Otelia Santee, LCSW Phone Number: 10/16/2023, 1:18 PM  Clinical Narrative:    Pt agreeable to have PCP appt scheduled. Appt scheduled for Monday 2/10 at 10:40am at the [facility] and Adult medicine. Information placed on f/u.         Expected Discharge Plan and Services                                               Social Determinants of Health (SDOH) Interventions SDOH Screenings   Food Insecurity: Food Insecurity Present (10/12/2023)  Housing: High Risk (10/12/2023)  Transportation Needs: No Transportation Needs (10/12/2023)  Utilities: Not At Risk (10/12/2023)  Depression (PHQ2-9): Low Risk  (05/17/2023)  Financial Resource Strain: Medium Risk (04/28/2022)  Stress: Stress Concern Present (04/28/2022)  Tobacco Use: High Risk (10/12/2023)    Readmission Risk Interventions     No data to display

## 2023-10-17 ENCOUNTER — Inpatient Hospital Stay (HOSPITAL_COMMUNITY): Payer: Medicaid Other

## 2023-10-17 ENCOUNTER — Other Ambulatory Visit: Payer: Self-pay

## 2023-10-17 LAB — CULTURE, BLOOD (ROUTINE X 2)
Culture: NO GROWTH
Culture: NO GROWTH
Special Requests: ADEQUATE
Special Requests: ADEQUATE

## 2023-10-17 LAB — BASIC METABOLIC PANEL
Anion gap: 12 (ref 5–15)
BUN: 9 mg/dL (ref 6–20)
CO2: 24 mmol/L (ref 22–32)
Calcium: 8.5 mg/dL — ABNORMAL LOW (ref 8.9–10.3)
Chloride: 104 mmol/L (ref 98–111)
Creatinine, Ser: 0.6 mg/dL (ref 0.44–1.00)
GFR, Estimated: >60 mL/min (ref >60)
Glucose, Bld: 112 mg/dL — ABNORMAL HIGH (ref 70–99)
Potassium: 3.4 mmol/L — ABNORMAL LOW (ref 3.5–5.1)
Sodium: 140 mmol/L (ref 135–145)

## 2023-10-17 LAB — CBC WITH DIFFERENTIAL/PLATELET
Abs Immature Granulocytes: 0.14 10*3/uL — ABNORMAL HIGH (ref 0.00–0.07)
Basophils Absolute: 0.1 10*3/uL (ref 0.0–0.1)
Basophils Relative: 1 %
Eosinophils Absolute: 0.2 10*3/uL (ref 0.0–0.5)
Eosinophils Relative: 2 %
HCT: 33.1 % — ABNORMAL LOW (ref 36.0–46.0)
Hemoglobin: 10.2 g/dL — ABNORMAL LOW (ref 12.0–15.0)
Immature Granulocytes: 1 %
Lymphocytes Relative: 28 %
Lymphs Abs: 3.6 10*3/uL (ref 0.7–4.0)
MCH: 25.8 pg — ABNORMAL LOW (ref 26.0–34.0)
MCHC: 30.8 g/dL (ref 30.0–36.0)
MCV: 83.8 fL (ref 80.0–100.0)
Monocytes Absolute: 1 10*3/uL (ref 0.1–1.0)
Monocytes Relative: 8 %
Neutro Abs: 8.1 10*3/uL — ABNORMAL HIGH (ref 1.7–7.7)
Neutrophils Relative %: 60 %
Platelets: 370 10*3/uL (ref 150–400)
RBC: 3.95 MIL/uL (ref 3.87–5.11)
RDW: 14.1 % (ref 11.5–15.5)
WBC: 13.1 10*3/uL — ABNORMAL HIGH (ref 4.0–10.5)
nRBC: 0 % (ref 0.0–0.2)

## 2023-10-17 LAB — GLUCOSE, CAPILLARY
Glucose-Capillary: 115 mg/dL — ABNORMAL HIGH (ref 70–99)
Glucose-Capillary: 164 mg/dL — ABNORMAL HIGH (ref 70–99)
Glucose-Capillary: 147 mg/dL — ABNORMAL HIGH (ref 70–99)
Glucose-Capillary: 130 mg/dL — ABNORMAL HIGH (ref 70–99)

## 2023-10-17 MED ORDER — POTASSIUM CHLORIDE CRYS ER 20 MEQ PO TBCR
40.0000 meq | EXTENDED_RELEASE_TABLET | Freq: Once | ORAL | Status: AC
Start: 2023-10-17 — End: 2023-10-17
  Administered 2023-10-17: 40 meq via ORAL
  Filled 2023-10-17: qty 2

## 2023-10-17 NOTE — Plan of Care (Signed)
  Problem: Education: Goal: Ability to describe self-care measures that may prevent or decrease complications (Diabetes Survival Skills Education) will improve Outcome: Progressing Goal: Individualized Educational Video(s) Outcome: Progressing   Problem: Coping: Goal: Ability to adjust to condition or change in health will improve Outcome: Progressing   Problem: Fluid Volume: Goal: Ability to maintain a balanced intake and output will improve Outcome: Progressing   Problem: Health Behavior/Discharge Planning: Goal: Ability to identify and utilize available resources and services will improve Outcome: Progressing Goal: Ability to manage health-related needs will improve Outcome: Progressing   Problem: Metabolic: Goal: Ability to maintain appropriate glucose levels will improve Outcome: Progressing   Problem: Nutritional: Goal: Maintenance of adequate nutrition will improve Outcome: Progressing Goal: Progress toward achieving an optimal weight will improve Outcome: Progressing   Problem: Skin Integrity: Goal: Risk for impaired skin integrity will decrease Outcome: Progressing   Problem: Tissue Perfusion: Goal: Adequacy of tissue perfusion will improve Outcome: Progressing   Problem: Education: Goal: Knowledge of General Education information will improve Description: Including pain rating scale, medication(s)/side effects and non-pharmacologic comfort measures Outcome: Progressing   Problem: Health Behavior/Discharge Planning: Goal: Ability to manage health-related needs will improve Outcome: Progressing   Problem: Clinical Measurements: Goal: Ability to maintain clinical measurements within normal limits will improve Outcome: Progressing Goal: Will remain free from infection Outcome: Progressing Goal: Diagnostic test results will improve Outcome: Progressing Goal: Respiratory complications will improve Outcome: Progressing Goal: Cardiovascular complication will  be avoided Outcome: Progressing   Problem: Activity: Goal: Risk for activity intolerance will decrease Outcome: Progressing   Problem: Nutrition: Goal: Adequate nutrition will be maintained Outcome: Progressing   Problem: Coping: Goal: Level of anxiety will decrease Outcome: Progressing   Problem: Elimination: Goal: Will not experience complications related to bowel motility Outcome: Progressing Goal: Will not experience complications related to urinary retention Outcome: Progressing   Problem: Pain Managment: Goal: General experience of comfort will improve and/or be controlled Outcome: Progressing   Problem: Safety: Goal: Ability to remain free from injury will improve Outcome: Progressing   Problem: Skin Integrity: Goal: Risk for impaired skin integrity will decrease Outcome: Progressing   Problem: Education: Goal: Ability to describe self-care measures that may prevent or decrease complications (Diabetes Survival Skills Education) will improve Outcome: Progressing Goal: Individualized Educational Video(s) Outcome: Progressing   Problem: Coping: Goal: Ability to adjust to condition or change in health will improve Outcome: Progressing   Problem: Fluid Volume: Goal: Ability to maintain a balanced intake and output will improve Outcome: Progressing   Problem: Health Behavior/Discharge Planning: Goal: Ability to identify and utilize available resources and services will improve Outcome: Progressing Goal: Ability to manage health-related needs will improve Outcome: Progressing   Problem: Metabolic: Goal: Ability to maintain appropriate glucose levels will improve Outcome: Progressing   Problem: Nutritional: Goal: Maintenance of adequate nutrition will improve Outcome: Progressing Goal: Progress toward achieving an optimal weight will improve Outcome: Progressing   Problem: Skin Integrity: Goal: Risk for impaired skin integrity will decrease Outcome:  Progressing   Problem: Tissue Perfusion: Goal: Adequacy of tissue perfusion will improve Outcome: Progressing   Problem: Clinical Measurements: Goal: Ability to avoid or minimize complications of infection will improve Outcome: Progressing   Problem: Skin Integrity: Goal: Skin integrity will improve Outcome: Progressing

## 2023-10-17 NOTE — Inpatient Diabetes Management (Signed)
Inpatient Diabetes Program Recommendations  AACE/ADA: New Consensus Statement on Inpatient Glycemic Control (2015)  Target Ranges:  Prepandial:   less than 140 mg/dL      Peak postprandial:   less than 180 mg/dL (1-2 hours)      Critically ill patients:  140 - 180 mg/dL   Lab Results  Component Value Date   GLUCAP 164 (H) 10/17/2023   HGBA1C 11.7 (H) 10/12/2023     Discharge Recommendations: Other recommendations: Metfromin 500 mg BID Long acting recommendations: Insulin Glargine (BASAGLAR) Kwikpen 25 units QD (home dose)  Short acting recommendations:  Meal coverage ONLY Insulin lispro (HUMALOG) KwikPen  10 units TID (home dose)   Supply/Referral recommendations: Pen needles - long (BMI >40)   Use Adult Diabetes Insulin Treatment Post Discharge order set.  Catie Clearance Coots, PharmD reached out to me after I made a referral for out patient pharmacy follow up.  Catie is familiar with this patient and can assist her with getting basal/bolus insulins rather than 70/30.  Updated DC recommendations.  Will reach out to [facility] pharmacy for a [facility] letter for DC.  Spoke with Chablis and explained Catie will be reaching out to her and will assist her with insulins as she is uninsured.  Willowdean verbalizes understanding.    Will continue to follow while inpatient.  Thank you, Dulce Sellar, MSN, CDCES Diabetes Coordinator Inpatient Diabetes Program [phone] (team pager from 8a-5p)

## 2023-10-17 NOTE — Progress Notes (Signed)
PROGRESS NOTE                                                                                                                                                                                                             Patient Demographics:    Carolyn Zimmerman, is a 38 y.o. female, DOB - May 11, 1986, ZOX:[id number]  Outpatient Primary MD for the patient is Ivonne Andrew, NP    LOS - 5  Admit date - 10/11/2023    Chief Complaint  Patient presents with   Nausea       Brief Narrative (HPI from H&P)   Carolyn Zimmerman is a 38 y.o. female with medical history significant of anxiety, depression, coronavirus infection, left MCA watershed CVA, hyperlipidemia, hypertension, hypertensive heart disease, chronic combined systolic heart failure with EF of 40 to 45% in June 2024, jaundice, class III obesity, tobacco use, history of acute pancreatitis, insulin-dependent type 2 diabetes,  tubo-ovarian abscess, hidradenitis suppurativa, chest wall abscesses, history of sepsis secondary to necrotizing fasciitis due to panniculitis with abdominal wall abscess requiring panniculectomy who presented to emergency department with nausea, vomiting, chills, fever, malaise and RLE tenderness with erythema/edema and admitted for sepsis secondary to cellulitis.  The patient was lying quietly in bed upon my visit. Once I entered the room the patient began to moan and cry out with pain when asked to move to allow me to examine her leg. Her right leg is much less swollen. It is no longer warm. She cries out with pain during palpation much more than yesterday. Her WBC has increased to 13.1. I do not palpate any fluctuance, but will order US of the extremity to rule out development of an abscess. An attempt was made to ambulate her in the hall.  Per nursing the patient was unable to ambulate in the hall with out a walker or holding on to the wall. Will ask the patient to be  evaluated by PT/OT.   Subjective:   Carolyn Zimmerman Pt is complaining that the pain is much worse. She cries out when just moving the leg as well as when the leg is palpated.   Assessment  & Plan :    Assessment and Plan: Sepsis secondary to cellulitis - Resolved. The patient presented on 10/11/2023 with fever of with temp greater than 100.2, a greater than 20  mmHg drop in systolic blood pressure, tachypnea, and tachycardia in the setting of cellulitis of the right lower extremity. WBC was 34.4.Marland Kitchen  RLE cellulitis -The right lower extremity continues to improve in appearance, although the patient is complaining more of pain than she has in the last 2-3 days. -Continue IV Rocephin -WBC has increased from 10.7 to 13.1. It had been 15.7 on presentation. -RLE ultrasound has ruled out DVT on 10/14/2023. Will repeat US of the right lower extremity due to increase in patient's pain and the WBC. The patient remains afebrile. -Pain is controlled on oxycodone.   T2DM, uncontrolled A1c of 11.7% on admission in the setting of not taking any insulin or diabetes medication over the last 3 months.  -Blood sugars have been well controlled over the past 24 hours. -Resume home metformin -Increase Semglee from 25 to 30 units daily -Continue SSI with meals, CBG monitoring -Diabetic educator visited the patient today.  HTN SBP remains elevated yesterday.   The Coreg was increased to 37.5 bid. Monitor. Start irbesartan 300 mg daily (substitute for home valsartan 320 mg)  HFrEF Last TTE in June 2024 showed EF 40-45%, LV global hypokinesis, moderate eccentric LVH, G2DD, and severely dilated LA. patient has received 7.5 L of fluid since admission with urine output of Foley 1.5 L.  She is not in respiratory distress but is slightly fluid overloaded.  GDMT previously included losartan, spironolactone, Coreg and torsemide however patient has not taken these medications for the last 3 months. -Telemetry -Resume  torsemide 40 mg daily -Continue beta-blocker and ARB as above -Trend and replete electrolytes -Strict I&O's, daily weights. Likely not accurate. According to this data the patient is positive 7 liters since admission.   Bilateral onychomycosis Poor podiatric hygiene Severe callouses with cracking of soles and heels The patient asked how she got the cellulitis and the infection that caused it. I examined her feet. While her toenails have been painted, it is easy to see the debris of fungal infection under the toenails from the underside of her feet. Her feet are dirty with severe callouses and deep cracks in her heels and her soles. I discussed with the patient the importance of caring for her feet diligently and consistently each morning and night. She was also admonished to wear hard soled shoes at all times. Once she is recovered from this infection, she should be started on lamisil by her PCP. Perhaps she should start to see podiatry to maintain the health of her feet. I also exhorted the importance of keeping her glucoses under control.  HLD Hx CVA -Resume aspirin and rosuvastatin -Check lipid panel  Tobacco use disorder -Continue nicotine patch  Hypomagnesemia, resolved Hypokalemia, resolved Hypophosphatemia, continue phosphorus tablets, follow-up repeat phosphorus tomorrow  Social determinants of health Reports financial difficulties and food insecurity.  She has had difficulty affording her medications over the last 3 months. -TOC consulted, appreciate assistance        Morbid obesity: Estimated body mass index is 49.79 kg/m as calculated from the following:   Height as of this encounter: 5\' 7"  (1.702 m).   Weight as of this encounter: 144.2 kg.  -Follow-up with PCP for nutrition and weight loss counseling        Condition -improving  Family Communication  : None  Code Status : Full code  Consults  : None  PUD Prophylaxis : None   Procedures  :     None       Disposition Plan  :  Status is: Inpatient Remains inpatient appropriate because: Treatment for cellulitis, RLE swelling and pain  DVT Prophylaxis  :  Lovenox    Lab Results  Component Value Date   PLT 370 10/17/2023    Diet :  Diet Order             Diet heart healthy/carb modified Fluid consistency: Thin; Fluid restriction: 1500 mL Fluid  Diet effective now                    Inpatient Medications  Scheduled Meds:  aspirin  81 mg Oral Daily   carvedilol  37.5 mg Oral BID WC   enoxaparin (LOVENOX) injection  60 mg Subcutaneous Q24H   insulin aspart  0-20 Units Subcutaneous TID WC   insulin glargine-yfgn  30 Units Subcutaneous Daily   irbesartan  300 mg Oral Daily   metFORMIN  1,000 mg Oral BID WC   nicotine  21 mg Transdermal Daily   rosuvastatin  20 mg Oral Daily   torsemide  40 mg Oral Daily   Continuous Infusions:  cefTRIAXone (ROCEPHIN)  IV Stopped (10/17/23 0923)   PRN Meds:.acetaminophen **OR** acetaminophen, ibuprofen, ondansetron **OR** ondansetron (ZOFRAN) IV, oxyCODONE, prochlorperazine  Antibiotics  :    Anti-infectives (From admission, onward)    Start     Dose/Rate Route Frequency Ordered Stop   10/12/23 0800  cefTRIAXone (ROCEPHIN) 2 g in sodium chloride 0.9 % 100 mL IVPB        2 g 200 mL/hr over 30 Minutes Intravenous Every 24 hours 10/12/23 0754     10/12/23 0030  vancomycin (VANCOCIN) IVPB 1000 mg/200 mL premix       Placed in "Followed by" Linked Group   1,000 mg 200 mL/hr over 60 Minutes Intravenous  Once 10/11/23 2317 10/12/23 0302   10/11/23 2330  vancomycin (VANCOCIN) IVPB 1000 mg/200 mL premix       Placed in "Followed by" Linked Group   1,000 mg 200 mL/hr over 60 Minutes Intravenous  Once 10/11/23 2317 10/12/23 0139   10/11/23 2300  ceFEPIme (MAXIPIME) 2 g in sodium chloride 0.9 % 100 mL IVPB        2 g 200 mL/hr over 30 Minutes Intravenous  Once 10/11/23 2258 10/12/23 0035   10/11/23 2300  metroNIDAZOLE (FLAGYL) IVPB 500  mg        500 mg 100 mL/hr over 60 Minutes Intravenous  Once 10/11/23 2258 10/12/23 0139   10/11/23 2300  vancomycin (VANCOCIN) IVPB 1000 mg/200 mL premix  Status:  Discontinued        1,000 mg 200 mL/hr over 60 Minutes Intravenous  Once 10/11/23 2258 10/11/23 2314         Objective:   Vitals:   10/16/23 2022 10/17/23 0450 10/17/23 0500 10/17/23 1219  BP: (!) 177/92 (!) 187/95  (!) 164/86  Pulse: 76 72  74  Resp: 17 17  17   Temp: 98 F (36.7 C) 98.1 F (36.7 C)  98.4 F (36.9 C)  TempSrc: Oral Oral    SpO2: 97% 98%  100%  Weight:   (!) 144.2 kg   Height:        Wt Readings from Last 3 Encounters:  10/17/23 (!) 144.2 kg  10/04/23 126.1 kg  05/31/23 126 kg     Intake/Output Summary (Last 24 hours) at 10/17/2023 1623 Last data filed at 10/16/2023 2118 Gross per 24 hour  Intake 150 ml  Output --  Net 150 ml  Exam:  Constitutional:  The patient is awake, alert, and oriented x 3. Mild distress from discomfort. Respiratory:  No increased work of breathing. No wheezes, rales, or rhonchi No tactile fremitus Cardiovascular:  Regular rate and rhythm No murmurs, ectopy, or gallups. No lateral PMI. No thrills. Abdomen:  Abdomen is soft, non-tender, non-distended No hernias, masses, or organomegaly Normoactive bowel sounds.  Musculoskeletal:  Left lower extremity without cyanosis or clubbing. No erythema or warmth. Non-tender. Right lower extremity is less warm, less tender to touch and much less erythematous today Patient states that pain is improved. Skin:  No rashes, lesions, ulcers palpation of skin: no induration or nodules, but patient cries out in pain. Swelling is resolved.  Erythema and warmth of right lower extremity is resolved. Toenails are painted on feet bilaterally. There is visible debris of fungal infection in the space beneath her toenails.  Soles and heels of the patient's feet are filthy, badly calloused with deep cracks in soles and  heels. Neurologic:  CN 2-12 intact Sensation all 4 extremities intact Psychiatric:  Mental status Mood, affect appropriate Orientation to person, place, time  judgment and insight appear intact       Data Review:    Recent Labs  Lab 10/13/23 0652 10/14/23 0554 10/15/23 0512 10/16/23 0609 10/17/23 0717  WBC 24.6* 15.0* 10.6* 10.7* 13.1*  HGB 10.7* 9.0* 9.0* 9.9* 10.2*  HCT 34.5* 30.0* 29.8* 32.5* 33.1*  PLT 297 259 285 319 370  MCV 82.5 83.8 84.4 83.5 83.8  MCH 25.6* 25.1* 25.5* 25.4* 25.8*  MCHC 31.0 30.0 30.2 30.5 30.8  RDW 14.8 14.6 14.4 14.4 14.1  LYMPHSABS 2.5 2.9 3.0 2.5 3.6  MONOABS 1.2* 1.1* 1.0 0.9 1.0  EOSABS 0.0 0.2 0.1 0.1 0.2  BASOSABS 0.1 0.0 0.0 0.0 0.1    Recent Labs  Lab 10/11/23 2231 10/11/23 2300 10/11/23 2300 10/12/23 0023 10/12/23 0314 10/12/23 0557 10/12/23 0952 10/12/23 1338 10/13/23 0652 10/14/23 0554 10/15/23 0512 10/16/23 0609 10/17/23 0717  NA  --  130*   < >  --   --   --   --  135 134* 135 135 134* 140  K  --  3.6   < >  --   --   --   --  3.4* 4.0 3.3* 3.1* 3.2* 3.4*  CL  --  99   < >  --   --   --   --  102 102 103 103 102 104  CO2  --  23   < >  --   --   --   --  23 23 25 23 23 24   ANIONGAP  --  8   < >  --   --   --   --  10 9 7 9 9 12   GLUCOSE  --  374*   < >  --   --   --   --  124* 225* 162* 163* 245* 112*  BUN  --  11   < >  --   --   --   --  11 11 14 11 11 9   CREATININE  --  1.19*   < >  --   --   --   --  1.01* 0.86 0.73 0.77 0.82 0.60  AST  --  16  --   --   --   --   --  21 18  --   --   --   --  ALT  --  15  --   --   --   --   --  17 18  --   --   --   --   ALKPHOS  --  68  --   --   --   --   --  59 82  --   --   --   --   BILITOT  --  0.6  --   --   --   --   --  0.5 0.5  --   --   --   --   ALBUMIN  --  2.8*  --   --   --   --   --  2.0* 2.1*  --   --   --   --   LATICACIDVEN 2.5*  --   --  2.6* 2.9* 1.9  --   --   --   --   --   --   --   HGBA1C  --   --   --   --   --   --  11.7*  --   --   --   --   --    --   MG  --   --   --   --   --   --   --  1.6* 2.2 1.9  --   --   --   CALCIUM  --  7.9*   < >  --   --   --   --  7.7* 7.7* 7.5* 7.7* 7.9* 8.5*   < > = values in this interval not displayed.      Recent Labs  Lab 10/11/23 2231 10/11/23 2300 10/12/23 0023 10/12/23 0314 10/12/23 0557 10/12/23 9562 10/12/23 1338 10/13/23 1308 10/14/23 0554 10/15/23 0512 10/16/23 0609 10/17/23 0717  LATICACIDVEN 2.5*  --  2.6* 2.9* 1.9  --   --   --   --   --   --   --   HGBA1C  --   --   --   --   --  11.7*  --   --   --   --   --   --   MG  --   --   --   --   --   --  1.6* 2.2 1.9  --   --   --   CALCIUM  --    < >  --   --   --   --  7.7* 7.7* 7.5* 7.7* 7.9* 8.5*   < > = values in this interval not displayed.    --------------------------------------------------------------------------------------------------------------- Lab Results  Component Value Date   CHOL 143 10/14/2023   HDL 29 (L) 10/14/2023   LDLCALC 69 10/14/2023   LDLDIRECT 64.0 03/26/2023   TRIG 224 (H) 10/14/2023   CHOLHDL 4.9 10/14/2023    Lab Results  Component Value Date   HGBA1C 11.7 (H) 10/12/2023   No results for input(s): "TSH", "T4TOTAL", "FREET4", "T3FREE", "THYROIDAB" in the last 72 hours. No results for input(s): "VITAMINB12", "FOLATE", "FERRITIN", "TIBC", "IRON", "RETICCTPCT" in the last 72 hours. ------------------------------------------------------------------------------------------------------------------ Cardiac Enzymes No results for input(s): "CKMB", "TROPONINI", "MYOGLOBIN" in the last 168 hours.  Invalid input(s): "CK"  Micro Results Recent Results (from the past 240 hours)  Resp panel by RT-PCR (RSV, Flu A&B, Covid) Anterior Nasal Swab     Status: None   Collection Time: 10/11/23  4:58 PM  Specimen: Anterior Nasal Swab  Result Value Ref Range Status   SARS Coronavirus 2 by RT PCR NEGATIVE NEGATIVE Final    Comment: (NOTE) SARS-CoV-2 target nucleic acids are NOT DETECTED.  The  SARS-CoV-2 RNA is generally detectable in upper respiratory specimens during the acute phase of infection. The lowest concentration of SARS-CoV-2 viral copies this assay can detect is 138 copies/mL. A negative result does not preclude SARS-Cov-2 infection and should not be used as the sole basis for treatment or other patient management decisions. A negative result may occur with  improper specimen collection/handling, submission of specimen other than nasopharyngeal swab, presence of viral mutation(s) within the areas targeted by this assay, and inadequate number of viral copies(<138 copies/mL). A negative result must be combined with clinical observations, patient history, and epidemiological information. The expected result is Negative.  Fact Sheet for Patients:  BloggerCourse.com  Fact Sheet for Healthcare Providers:  SeriousBroker.it  This test is no t yet approved or cleared by the [place] FDA and  has been authorized for detection and/or diagnosis of SARS-CoV-2 by FDA under an Emergency Use Authorization (EUA). This EUA will remain  in effect (meaning this test can be used) for the duration of the COVID-19 declaration under Section 564(b)(1) of the Act, 21 U.S.C.section 360bbb-3(b)(1), unless the authorization is terminated  or revoked sooner.       Influenza A by PCR NEGATIVE NEGATIVE Final   Influenza B by PCR NEGATIVE NEGATIVE Final    Comment: (NOTE) The Xpert Xpress SARS-CoV-2/FLU/RSV plus assay is intended as an aid in the diagnosis of influenza from Nasopharyngeal swab specimens and should not be used as a sole basis for treatment. Nasal washings and aspirates are unacceptable for Xpert Xpress SARS-CoV-2/FLU/RSV testing.  Fact Sheet for Patients: BloggerCourse.com  Fact Sheet for Healthcare Providers: SeriousBroker.it  This test is not yet approved or  cleared by the [place] FDA and has been authorized for detection and/or diagnosis of SARS-CoV-2 by FDA under an Emergency Use Authorization (EUA). This EUA will remain in effect (meaning this test can be used) for the duration of the COVID-19 declaration under Section 564(b)(1) of the Act, 21 U.S.C. section 360bbb-3(b)(1), unless the authorization is terminated or revoked.     Resp Syncytial Virus by PCR NEGATIVE NEGATIVE Final    Comment: (NOTE) Fact Sheet for Patients: BloggerCourse.com  Fact Sheet for Healthcare Providers: SeriousBroker.it  This test is not yet approved or cleared by the [place] FDA and has been authorized for detection and/or diagnosis of SARS-CoV-2 by FDA under an Emergency Use Authorization (EUA). This EUA will remain in effect (meaning this test can be used) for the duration of the COVID-19 declaration under Section 564(b)(1) of the Act, 21 U.S.C. section 360bbb-3(b)(1), unless the authorization is terminated or revoked.  Performed at [facility], [address], [place], [place] [address]   Group A Strep by PCR     Status: None   Collection Time: 10/11/23  4:58 PM   Specimen: Anterior Nasal Swab; Sterile Swab  Result Value Ref Range Status   Group A Strep by PCR NOT DETECTED NOT DETECTED Final    Comment: Performed at [facility], [address] [place], [place] [address]  Blood culture (routine x 2)     Status: None   Collection Time: 10/11/23 11:00 PM   Specimen: BLOOD  Result Value Ref Range Status   Specimen Description   Final    BLOOD LEFT ANTECUBITAL Performed at [facility], [address], [place], [place] [address]    Special Requests   Final    BOTTLES DRAWN AEROBIC AND ANAEROBIC Blood Culture adequate volume Performed at [facility], [address], [place], [place] [address]    Culture    Final    NO GROWTH 5 DAYS Performed at [facility], [address], [place], [place] [address]    Report Status 10/17/2023 FINAL  Final  Blood culture (routine x 2)     Status: None   Collection Time: 10/11/23 11:20 PM   Specimen: BLOOD  Result Value Ref Range Status   Specimen Description   Final    BLOOD BLOOD LEFT HAND Performed at [facility], [address], [place], [place] [address]    Special Requests   Final    BOTTLES DRAWN AEROBIC AND ANAEROBIC Blood Culture adequate volume Performed at [facility], [address], [place], [place] [address]    Culture   Final    NO GROWTH 5 DAYS Performed at [facility], [address], [place], [place] [address]    Report Status 10/17/2023 FINAL  Final    Radiology Reports No results found.     Signature  -   Fran Lowes M.D on 10/17/2023 at 4:23 PM   -  To page go to [email or web address]

## 2023-10-18 LAB — CBC WITH DIFFERENTIAL/PLATELET
Abs Immature Granulocytes: 0.21 10*3/uL — ABNORMAL HIGH (ref 0.00–0.07)
Basophils Absolute: 0.1 10*3/uL (ref 0.0–0.1)
Basophils Relative: 0 %
Eosinophils Absolute: 0.2 10*3/uL (ref 0.0–0.5)
Eosinophils Relative: 1 %
HCT: 33.8 % — ABNORMAL LOW (ref 36.0–46.0)
Hemoglobin: 10.5 g/dL — ABNORMAL LOW (ref 12.0–15.0)
Immature Granulocytes: 1 %
Lymphocytes Relative: 24 %
Lymphs Abs: 3.5 10*3/uL (ref 0.7–4.0)
MCH: 25.5 pg — ABNORMAL LOW (ref 26.0–34.0)
MCHC: 31.1 g/dL (ref 30.0–36.0)
MCV: 82.2 fL (ref 80.0–100.0)
Monocytes Absolute: 1.1 10*3/uL — ABNORMAL HIGH (ref 0.1–1.0)
Monocytes Relative: 7 %
Neutro Abs: 10 10*3/uL — ABNORMAL HIGH (ref 1.7–7.7)
Neutrophils Relative %: 67 %
Platelets: 356 10*3/uL (ref 150–400)
RBC: 4.11 MIL/uL (ref 3.87–5.11)
RDW: 14.4 % (ref 11.5–15.5)
WBC: 15.1 10*3/uL — ABNORMAL HIGH (ref 4.0–10.5)
nRBC: 0 % (ref 0.0–0.2)

## 2023-10-18 LAB — BASIC METABOLIC PANEL
Anion gap: 12 (ref 5–15)
BUN: 13 mg/dL (ref 6–20)
CO2: 24 mmol/L (ref 22–32)
Calcium: 8.6 mg/dL — ABNORMAL LOW (ref 8.9–10.3)
Chloride: 104 mmol/L (ref 98–111)
Creatinine, Ser: 0.79 mg/dL (ref 0.44–1.00)
GFR, Estimated: >60 mL/min (ref >60)
Glucose, Bld: 214 mg/dL — ABNORMAL HIGH (ref 70–99)
Potassium: 3.8 mmol/L (ref 3.5–5.1)
Sodium: 140 mmol/L (ref 135–145)

## 2023-10-18 LAB — GLUCOSE, CAPILLARY
Glucose-Capillary: 194 mg/dL — ABNORMAL HIGH (ref 70–99)
Glucose-Capillary: 194 mg/dL — ABNORMAL HIGH (ref 70–99)
Glucose-Capillary: 175 mg/dL — ABNORMAL HIGH (ref 70–99)
Glucose-Capillary: 170 mg/dL — ABNORMAL HIGH (ref 70–99)

## 2023-10-18 MED ORDER — HYDRALAZINE HCL 20 MG/ML IJ SOLN: 10.0000 mg | Freq: Four times a day (QID) | INTRAMUSCULAR | Status: DC | PRN

## 2023-10-18 MED ORDER — CYCLOBENZAPRINE HCL 5 MG PO TABS
7.5000 mg | ORAL_TABLET | Freq: Three times a day (TID) | ORAL | Status: DC | PRN
  Administered 2023-10-18 – 2023-10-19 (×2): 7.5 mg via ORAL
  Filled 2023-10-18 (×2): qty 2

## 2023-10-18 MED ORDER — CEFAZOLIN SODIUM-DEXTROSE 2-4 GM/100ML-% IV SOLN
2.0000 g | Freq: Three times a day (TID) | INTRAVENOUS | Status: DC
  Administered 2023-10-18 – 2023-10-21 (×9): 2 g via INTRAVENOUS
  Filled 2023-10-18 (×9): qty 100

## 2023-10-18 MED ORDER — ENOXAPARIN SODIUM 80 MG/0.8ML IJ SOSY
70.0000 mg | PREFILLED_SYRINGE | INTRAMUSCULAR | Status: DC
  Administered 2023-10-18 – 2023-10-20 (×3): 70 mg via SUBCUTANEOUS
  Filled 2023-10-18 (×3): qty 0.8

## 2023-10-18 NOTE — Progress Notes (Signed)
PROGRESS NOTE                                                                                                                                                                                                             Patient Demographics:    Carolyn Zimmerman, is a 38 y.o. female, DOB - 11/05/1985, BJY:[id number]  Outpatient Primary MD for the patient is Ivonne Andrew, NP    LOS - 6  Admit date - 10/11/2023    Chief Complaint  Patient presents with   Nausea       Brief Narrative (HPI from H&P)   Carolyn Zimmerman is a 38 y.o. female with medical history significant of anxiety, depression, coronavirus infection, left MCA watershed CVA, hyperlipidemia, hypertension, hypertensive heart disease, chronic combined systolic heart failure with EF of 40 to 45% in June 2024, jaundice, class III obesity, tobacco use, history of acute pancreatitis, insulin-dependent type 2 diabetes,  tubo-ovarian abscess, hidradenitis suppurativa, chest wall abscesses, history of sepsis secondary to necrotizing fasciitis due to panniculitis with abdominal wall abscess requiring panniculectomy who presented to emergency department with nausea, vomiting, chills, fever, malaise and RLE tenderness with erythema/edema and admitted for sepsis secondary to cellulitis.  The patient was lying quietly in bed upon my visit. Once I entered the room the patient began to moan and cry out with pain when asked to move to allow me to examine her leg. Her right leg is much less swollen. It is no longer warm. She cries out with pain during palpation much more than yesterday. Her WBC has increased again from 13.1 to 15.1. US of the right lower extremity was performed and did not demonstrate an abscess. Antibiotics have been changed from ceftriaxone to ancef and vancomycin.   Subjective:   Carolyn Zimmerman Pt is resting comfortably, but continues to complain of severe pain in the right  leg.   Assessment  & Plan :    Assessment and Plan: Sepsis secondary to cellulitis - Resolved. The patient presented on 10/11/2023 with fever of with temp greater than 100.2, a greater than 20 mmHg drop in systolic blood pressure, tachypnea, and tachycardia in the setting of cellulitis of the right lower extremity. WBC was 34.4 upon admission. It then declined to   RLE cellulitis -The right lower extremity continues to improve in appearance, although the patient  is complaining more of pain than she has in the last 2-3 days. -Antibiotics changed to ancef and vancomycin due to persistent severe pain and elevated WBC. -WBC has increased from 10.7 to 13.1. It had been 34.4 on presentation. -RLE ultrasound has ruled out DVT on 10/14/2023. -Pain is controlled on oxycodone.   T2DM, uncontrolled A1c of 11.7% on admission in the setting of not taking any insulin or diabetes medication over the last 3 months.  -Blood sugars have been well controlled over the past 24 hours. -Resume home metformin -Increase Semglee from 25 to 30 units daily -Continue SSI with meals, CBG monitoring -Diabetic educator visited the patient today.  HTN SBP remains elevated yesterday.   The Coreg was increased to 37.5 bid. Monitor. Start irbesartan 300 mg daily (substitute for home valsartan 320 mg)  HFrEF Last TTE in June 2024 showed EF 40-45%, LV global hypokinesis, moderate eccentric LVH, G2DD, and severely dilated LA. patient has received 7.5 L of fluid since admission with urine output of Foley 1.5 L.  She is not in respiratory distress but is slightly fluid overloaded.  GDMT previously included losartan, spironolactone, Coreg and torsemide however patient has not taken these medications for the last 3 months. -Telemetry -Resume torsemide 40 mg daily -Continue beta-blocker and ARB as above -Trend and replete electrolytes -Strict I&O's, daily weights. Likely not accurate. According to this data the patient is  positive 7 liters since admission.   Bilateral onychomycosis Poor podiatric hygiene Severe callouses with cracking of soles and heels The patient asked how she got the cellulitis and the infection that caused it. I examined her feet. While her toenails have been painted, it is easy to see the debris of fungal infection under the toenails from the underside of her feet. Her feet are dirty with severe callouses and deep cracks in her heels and her soles. I discussed with the patient the importance of caring for her feet diligently and consistently each morning and night. She was also admonished to wear hard soled shoes at all times. Once she is recovered from this infection, she should be started on lamisil by her PCP. Perhaps she should start to see podiatry to maintain the health of her feet. I also exhorted the importance of keeping her glucoses under control.  HLD Hx CVA -Resume aspirin and rosuvastatin -Check lipid panel  Tobacco use disorder -Continue nicotine patch  Hypomagnesemia, resolved Hypokalemia, resolved Hypophosphatemia, continue phosphorus tablets, follow-up repeat phosphorus tomorrow  Social determinants of health Reports financial difficulties and food insecurity.  She has had difficulty affording her medications over the last 3 months. -TOC consulted, appreciate assistance        Morbid obesity: Estimated body mass index is 49.79 kg/m as calculated from the following:   Height as of this encounter: 5\' 7"  (1.702 m).   Weight as of this encounter: 144.2 kg.  -Follow-up with PCP for nutrition and weight loss counseling        Condition -improving  Family Communication  : None  Code Status : Full code  Consults  : None  PUD Prophylaxis : None   Procedures  :     None      Disposition Plan  :    Status is: Inpatient Remains inpatient appropriate because: Treatment for cellulitis, RLE swelling and pain  DVT Prophylaxis  :  Lovenox    Lab  Results  Component Value Date   PLT 356 10/18/2023    Diet :  Diet Order  Diet heart healthy/carb modified Fluid consistency: Thin; Fluid restriction: 1500 mL Fluid  Diet effective now                    Inpatient Medications  Scheduled Meds:  aspirin  81 mg Oral Daily   carvedilol  37.5 mg Oral BID WC   enoxaparin (LOVENOX) injection  70 mg Subcutaneous Q24H   insulin aspart  0-20 Units Subcutaneous TID WC   insulin glargine-yfgn  30 Units Subcutaneous Daily   irbesartan  300 mg Oral Daily   metFORMIN  1,000 mg Oral BID WC   nicotine  21 mg Transdermal Daily   rosuvastatin  20 mg Oral Daily   torsemide  40 mg Oral Daily   Continuous Infusions:   ceFAZolin (ANCEF) IV Stopped (10/18/23 1313)   PRN Meds:.acetaminophen **OR** acetaminophen, cyclobenzaprine, ibuprofen, ondansetron **OR** ondansetron (ZOFRAN) IV, oxyCODONE, prochlorperazine  Antibiotics  :    Anti-infectives (From admission, onward)    Start     Dose/Rate Route Frequency Ordered Stop   10/18/23 1400  ceFAZolin (ANCEF) IVPB 2g/100 mL premix        2 g 200 mL/hr over 30 Minutes Intravenous Every 8 hours 10/18/23 0937 10/25/23 1359   10/12/23 0800  cefTRIAXone (ROCEPHIN) 2 g in sodium chloride 0.9 % 100 mL IVPB  Status:  Discontinued        2 g 200 mL/hr over 30 Minutes Intravenous Every 24 hours 10/12/23 0754 10/18/23 0937   10/12/23 0030  vancomycin (VANCOCIN) IVPB 1000 mg/200 mL premix       Placed in "Followed by" Linked Group   1,000 mg 200 mL/hr over 60 Minutes Intravenous  Once 10/11/23 2317 10/12/23 0302   10/11/23 2330  vancomycin (VANCOCIN) IVPB 1000 mg/200 mL premix       Placed in "Followed by" Linked Group   1,000 mg 200 mL/hr over 60 Minutes Intravenous  Once 10/11/23 2317 10/12/23 0139   10/11/23 2300  ceFEPIme (MAXIPIME) 2 g in sodium chloride 0.9 % 100 mL IVPB        2 g 200 mL/hr over 30 Minutes Intravenous  Once 10/11/23 2258 10/12/23 0035   10/11/23 2300  metroNIDAZOLE  (FLAGYL) IVPB 500 mg        500 mg 100 mL/hr over 60 Minutes Intravenous  Once 10/11/23 2258 10/12/23 0139   10/11/23 2300  vancomycin (VANCOCIN) IVPB 1000 mg/200 mL premix  Status:  Discontinued        1,000 mg 200 mL/hr over 60 Minutes Intravenous  Once 10/11/23 2258 10/11/23 2314         Objective:   Vitals:   10/17/23 1219 10/17/23 1937 10/18/23 0502 10/18/23 1205  BP: (!) 164/86 (!) 160/96 (!) 166/92 (!) 164/97  Pulse: 74 73 79 72  Resp: 17 16 16 19   Temp: 98.4 F (36.9 C) 98.3 F (36.8 C) 98.4 F (36.9 C) 98.4 F (36.9 C)  TempSrc:   Oral   SpO2: 100% 100% 100% 100%  Weight:      Height:        Wt Readings from Last 3 Encounters:  10/17/23 (!) 144.2 kg  10/04/23 126.1 kg  05/31/23 126 kg     Intake/Output Summary (Last 24 hours) at 10/18/2023 1617 Last data filed at 10/18/2023 1542 Gross per 24 hour  Intake 100 ml  Output --  Net 100 ml     Exam:  Constitutional:  The patient is awake, alert, and oriented x 3. Mild distress  from discomfort. Respiratory:  No increased work of breathing. No wheezes, rales, or rhonchi No tactile fremitus Cardiovascular:  Regular rate and rhythm No murmurs, ectopy, or gallups. No lateral PMI. No thrills. Abdomen:  Abdomen is soft, non-tender, non-distended No hernias, masses, or organomegaly Normoactive bowel sounds.  Musculoskeletal:  Left lower extremity without cyanosis or clubbing. No erythema or warmth. Non-tender. Right lower extremity has no warmth or erythema but remains tender to touch  Patient states that pain is improved. Skin:  No rashes, lesions, ulcers palpation of skin: no induration or nodules, but patient cries out in pain. Swelling is resolved.  Erythema and warmth of right lower extremity is resolved. Toenails are painted on feet bilaterally. There is visible debris of fungal infection in the space beneath her toenails.  Soles and heels of the patient's feet are filthy, badly calloused with  deep cracks in soles and heels. Neurologic:  CN 2-12 intact Sensation all 4 extremities intact Psychiatric:  Mental status Mood, affect appropriate Orientation to person, place, time  judgment and insight appear intact       Data Review:    Recent Labs  Lab 10/14/23 0554 10/15/23 0512 10/16/23 0609 10/17/23 0717 10/18/23 0559  WBC 15.0* 10.6* 10.7* 13.1* 15.1*  HGB 9.0* 9.0* 9.9* 10.2* 10.5*  HCT 30.0* 29.8* 32.5* 33.1* 33.8*  PLT 259 285 319 370 356  MCV 83.8 84.4 83.5 83.8 82.2  MCH 25.1* 25.5* 25.4* 25.8* 25.5*  MCHC 30.0 30.2 30.5 30.8 31.1  RDW 14.6 14.4 14.4 14.1 14.4  LYMPHSABS 2.9 3.0 2.5 3.6 3.5  MONOABS 1.1* 1.0 0.9 1.0 1.1*  EOSABS 0.2 0.1 0.1 0.2 0.2  BASOSABS 0.0 0.0 0.0 0.1 0.1    Recent Labs  Lab 10/11/23 2231 10/11/23 2300 10/11/23 2300 10/12/23 0023 10/12/23 0314 10/12/23 0557 10/12/23 0952 10/12/23 1338 10/13/23 0652 10/14/23 0554 10/15/23 0512 10/16/23 0609 10/17/23 0717 10/18/23 0559  NA  --  130*   < >  --   --   --   --  135 134* 135 135 134* 140 140  K  --  3.6   < >  --   --   --   --  3.4* 4.0 3.3* 3.1* 3.2* 3.4* 3.8  CL  --  99   < >  --   --   --   --  102 102 103 103 102 104 104  CO2  --  23   < >  --   --   --   --  23 23 25 23 23 24 24   ANIONGAP  --  8   < >  --   --   --   --  10 9 7 9 9 12 12   GLUCOSE  --  374*   < >  --   --   --   --  124* 225* 162* 163* 245* 112* 214*  BUN  --  11   < >  --   --   --   --  11 11 14 11 11 9 13   CREATININE  --  1.19*   < >  --   --   --   --  1.01* 0.86 0.73 0.77 0.82 0.60 0.79  AST  --  16  --   --   --   --   --  21 18  --   --   --   --   --   ALT  --  15  --   --   --   --   --  17 18  --   --   --   --   --   ALKPHOS  --  68  --   --   --   --   --  59 82  --   --   --   --   --   BILITOT  --  0.6  --   --   --   --   --  0.5 0.5  --   --   --   --   --   ALBUMIN  --  2.8*  --   --   --   --   --  2.0* 2.1*  --   --   --   --   --   LATICACIDVEN 2.5*  --   --  2.6* 2.9* 1.9  --    --   --   --   --   --   --   --   HGBA1C  --   --   --   --   --   --  11.7*  --   --   --   --   --   --   --   MG  --   --   --   --   --   --   --  1.6* 2.2 1.9  --   --   --   --   CALCIUM  --  7.9*   < >  --   --   --   --  7.7* 7.7* 7.5* 7.7* 7.9* 8.5* 8.6*   < > = values in this interval not displayed.      Recent Labs  Lab 10/11/23 2231 10/11/23 2300 10/12/23 0023 10/12/23 0314 10/12/23 0557 10/12/23 1610 10/12/23 1338 10/13/23 9604 10/14/23 0554 10/15/23 0512 10/16/23 0609 10/17/23 0717 10/18/23 0559  LATICACIDVEN 2.5*  --  2.6* 2.9* 1.9  --   --   --   --   --   --   --   --   HGBA1C  --   --   --   --   --  11.7*  --   --   --   --   --   --   --   MG  --   --   --   --   --   --  1.6* 2.2 1.9  --   --   --   --   CALCIUM  --    < >  --   --   --   --  7.7* 7.7* 7.5* 7.7* 7.9* 8.5* 8.6*   < > = values in this interval not displayed.    --------------------------------------------------------------------------------------------------------------- Lab Results  Component Value Date   CHOL 143 10/14/2023   HDL 29 (L) 10/14/2023   LDLCALC 69 10/14/2023   LDLDIRECT 64.0 03/26/2023   TRIG 224 (H) 10/14/2023   CHOLHDL 4.9 10/14/2023    Lab Results  Component Value Date   HGBA1C 11.7 (H) 10/12/2023   No results for input(s): "TSH", "T4TOTAL", "FREET4", "T3FREE", "THYROIDAB" in the last 72 hours. No results for input(s): "VITAMINB12", "FOLATE", "FERRITIN", "TIBC", "IRON", "RETICCTPCT" in the last 72 hours. ------------------------------------------------------------------------------------------------------------------ Cardiac Enzymes No results for input(s): "CKMB", "TROPONINI", "MYOGLOBIN" in the last 168 hours.  Invalid input(s): "CK"  Micro Results Recent Results (from the past  240 hours)  Resp panel by RT-PCR (RSV, Flu A&B, Covid) Anterior Nasal Swab     Status: None   Collection Time: 10/11/23  4:58 PM   Specimen: Anterior Nasal Swab  Result Value Ref  Range Status   SARS Coronavirus 2 by RT PCR NEGATIVE NEGATIVE Final    Comment: (NOTE) SARS-CoV-2 target nucleic acids are NOT DETECTED.  The SARS-CoV-2 RNA is generally detectable in upper respiratory specimens during the acute phase of infection. The lowest concentration of SARS-CoV-2 viral copies this assay can detect is 138 copies/mL. A negative result does not preclude SARS-Cov-2 infection and should not be used as the sole basis for treatment or other patient management decisions. A negative result may occur with  improper specimen collection/handling, submission of specimen other than nasopharyngeal swab, presence of viral mutation(s) within the areas targeted by this assay, and inadequate number of viral copies(<138 copies/mL). A negative result must be combined with clinical observations, patient history, and epidemiological information. The expected result is Negative.  Fact Sheet for Patients:  BloggerCourse.com  Fact Sheet for Healthcare Providers:  SeriousBroker.it  This test is no t yet approved or cleared by the [place] FDA and  has been authorized for detection and/or diagnosis of SARS-CoV-2 by FDA under an Emergency Use Authorization (EUA). This EUA will remain  in effect (meaning this test can be used) for the duration of the COVID-19 declaration under Section 564(b)(1) of the Act, 21 U.S.C.section 360bbb-3(b)(1), unless the authorization is terminated  or revoked sooner.       Influenza A by PCR NEGATIVE NEGATIVE Final   Influenza B by PCR NEGATIVE NEGATIVE Final    Comment: (NOTE) The Xpert Xpress SARS-CoV-2/FLU/RSV plus assay is intended as an aid in the diagnosis of influenza from Nasopharyngeal swab specimens and should not be used as a sole basis for treatment. Nasal washings and aspirates are unacceptable for Xpert Xpress SARS-CoV-2/FLU/RSV testing.  Fact Sheet for  Patients: BloggerCourse.com  Fact Sheet for Healthcare Providers: SeriousBroker.it  This test is not yet approved or cleared by the [place] FDA and has been authorized for detection and/or diagnosis of SARS-CoV-2 by FDA under an Emergency Use Authorization (EUA). This EUA will remain in effect (meaning this test can be used) for the duration of the COVID-19 declaration under Section 564(b)(1) of the Act, 21 U.S.C. section 360bbb-3(b)(1), unless the authorization is terminated or revoked.     Resp Syncytial Virus by PCR NEGATIVE NEGATIVE Final    Comment: (NOTE) Fact Sheet for Patients: BloggerCourse.com  Fact Sheet for Healthcare Providers: SeriousBroker.it  This test is not yet approved or cleared by the [place] FDA and has been authorized for detection and/or diagnosis of SARS-CoV-2 by FDA under an Emergency Use Authorization (EUA). This EUA will remain in effect (meaning this test can be used) for the duration of the COVID-19 declaration under Section 564(b)(1) of the Act, 21 U.S.C. section 360bbb-3(b)(1), unless the authorization is terminated or revoked.  Performed at [facility], [address] [place], [place] [address]   Group A Strep by PCR     Status: None   Collection Time: 10/11/23  4:58 PM   Specimen: Anterior Nasal Swab; Sterile Swab  Result Value Ref Range Status   Group A Strep by PCR NOT DETECTED NOT DETECTED Final    Comment: Performed at [facility], [address], [place], [place] [address]  Blood culture (routine x 2)     Status: None   Collection Time:  10/11/23 11:00 PM   Specimen: BLOOD  Result Value Ref Range Status   Specimen Description   Final    BLOOD LEFT ANTECUBITAL Performed at [facility], [address], [place], [place] [address]    Special Requests   Final     BOTTLES DRAWN AEROBIC AND ANAEROBIC Blood Culture adequate volume Performed at [facility], [address] [place], [place] [address]    Culture   Final    NO GROWTH 5 DAYS Performed at [facility], [address], [place], [place] [address]    Report Status 10/17/2023 FINAL  Final  Blood culture (routine x 2)     Status: None   Collection Time: 10/11/23 11:20 PM   Specimen: BLOOD  Result Value Ref Range Status   Specimen Description   Final    BLOOD BLOOD LEFT HAND Performed at [facility], [address] [place], [place] [address]    Special Requests   Final    BOTTLES DRAWN AEROBIC AND ANAEROBIC Blood Culture adequate volume Performed at [facility], [address] [place], [place] [address]    Culture   Final    NO GROWTH 5 DAYS Performed at [facility], [address] [place], [place] [address]    Report Status 10/17/2023 FINAL  Final    Radiology Reports [place] RT LOWER EXTREM LTD SOFT TISSUE NON VASCULAR Result Date: 10/17/2023 CLINICAL DATA:  Cellulitis and abscess of right leg. EXAM: ULTRASOUND RIGHT LOWER EXTREMITY LIMITED TECHNIQUE: Ultrasound examination of the lower extremity soft tissues was performed in the area of clinical concern. COMPARISON:  CT 10/11/2023 FINDINGS: Targeted sonographic evaluation in the area of clinical concern labeled right calf. Subcutaneous edema without focal fluid collection. No gross hyperemia. IMPRESSION: Subcutaneous edema without focal fluid collection. Electronically Signed   By: Narda Rutherford M.D.   On: 10/17/2023 18:41       Signature  -   Fran Lowes M.D on 10/18/2023 at 4:17 PM   -  To page go to [email or web address]

## 2023-10-18 NOTE — Plan of Care (Signed)
  Problem: Coping: Goal: Ability to adjust to condition or change in health will improve Outcome: Progressing   Problem: Health Behavior/Discharge Planning: Goal: Ability to manage health-related needs will improve Outcome: Progressing   Problem: Tissue Perfusion: Goal: Adequacy of tissue perfusion will improve Outcome: Progressing

## 2023-10-18 NOTE — TOC Progression Note (Signed)
Transition of Care [facility]) - Progression Note    Patient Details  Name: Carolyn Zimmerman MRN: [id number] Date of Birth: March 13, 1986  Transition of Care [facility]) CM/SW Contact  Otelia Santee, LCSW Phone Number: 10/18/2023, 1:59 PM  Clinical Narrative:    Have contacted [facility] to determine if they can provide charity HHPT for pt at discharge. Awaiting determination. Spoke with pt about recommendation for RW. As pt does not have insurance she would be required to private pay. Pt to look for RW at thrift store or to obtain online.         Expected Discharge Plan and Services                                               Social Determinants of Health (SDOH) Interventions SDOH Screenings   Food Insecurity: Food Insecurity Present (10/12/2023)  Housing: High Risk (10/12/2023)  Transportation Needs: No Transportation Needs (10/12/2023)  Utilities: Not At Risk (10/12/2023)  Depression (PHQ2-9): Low Risk  (05/17/2023)  Financial Resource Strain: Medium Risk (04/28/2022)  Stress: Stress Concern Present (04/28/2022)  Tobacco Use: High Risk (10/12/2023)    Readmission Risk Interventions     No data to display

## 2023-10-18 NOTE — Evaluation (Signed)
Occupational Therapy Evaluation and Discharge Patient Details Name: Carolyn Zimmerman MRN: [id number] DOB: Aug 09, 1986 Today's Date: 10/18/2023   History of Present Illness 38 y.o. female who presented to emergency department with nausea, vomiting, chills, fever, malaise and RLE tenderness with erythema/edema and admitted for sepsis secondary to cellulitis. Pt with medical history significant of anxiety, depression, coronavirus infection, left MCA watershed CVA, hyperlipidemia, hypertension, hypertensive heart disease, chronic combined systolic heart failure with EF of 40 to 45% in June 2024, jaundice, class III obesity, tobacco use, history of acute pancreatitis, insulin-dependent type 2 diabetes,  tubo-ovarian abscess, hidradenitis suppurativa, chest wall abscesses, history of sepsis secondary to necrotizing fasciitis due to panniculitis with abdominal wall abscess requiring panniculectomy   Clinical Impression   This 38 yo female admitted with above presents to acute OT with decreased balance and increased pain thus making her more an a Mod I level than Independent level for basic ADLs. All education completed with pt, we will sign off.       If plan is discharge home, recommend the following: Assist for transportation    Functional Status Assessment  Patient has had a recent decline in their functional status and demonstrates the ability to make significant improvements in function in a reasonable and predictable amount of time. (without further need for skilled OT)  Equipment Recommendations  None recommended by OT       Precautions / Restrictions Precautions Precautions: Fall Precaution Comments: denies falls in past 6 months Restrictions Weight Bearing Restrictions Per Provider Order: No      Mobility Bed Mobility Overal bed mobility: Modified Independent             General bed mobility comments: HOB up       Balance Overall balance assessment:  (Independent with sitting  balance--needs AD for standing and ambulating)                                         ADL either performed or assessed with clinical judgement   ADL                                         General ADL Comments: Pt reports that she has been walking herself to the bathroom either with RW or pushing IV pole. We talked about the possiblity of needing a shower seat since her RLE is so sore at rest and with movement but she feels she can manage without it. The only thing she said is really difficult for her right now is getting socks on, so I showed her a wide sock aid and had her return demonstrate using it. Issued wide sock aid to her.     Vision Patient Visual Report: No change from baseline              Pertinent Vitals/Pain Pain Assessment Pain Assessment: 0-10 Pain Score: 8  (when right lower leg is touched) Pain Location: RLE Pain Intervention(s): Limited activity within patient's tolerance, Monitored during session, Repositioned     Extremity/Trunk Assessment Upper Extremity Assessment Upper Extremity Assessment: Overall WFL for tasks assessed      Communication Communication Communication: No apparent difficulties   Cognition Arousal: Alert Behavior During Therapy: WFL for tasks assessed/performed Overall Cognitive Status: Within Functional Limits for tasks assessed  Home Living Family/patient expects to be discharged to:: Private residence Living Arrangements: Children (9 and 11) Available Help at Discharge: Family;Available PRN/intermittently Type of Home: House Home Access: Level entry     Home Layout: Two level   Alternate Level Stairs-Rails: Can reach both;Left;Right Bathroom Shower/Tub: Tub/shower unit;Curtain   Bathroom Toilet: Standard     Home Equipment: None          Prior Functioning/Environment Prior Level of Function :  Independent/Modified Independent             Mobility Comments: walks without AD, no falls in past 6 months, works at [employer] (standing) ADLs Comments: independent        OT Problem List: Decreased range of motion;Impaired balance (sitting and/or standing);Pain         OT Goals(Current goals can be found in the care plan section) Acute Rehab OT Goals Patient Stated Goal: for leg to feel better         AM-PAC OT "6 Clicks" Daily Activity     Outcome Measure Help from another person eating meals?: None Help from another person taking care of personal grooming?: None Help from another person toileting, which includes using toliet, bedpan, or urinal?: None Help from another person bathing (including washing, rinsing, drying)?: None Help from another person to put on and taking off regular upper body clothing?: None Help from another person to put on and taking off regular lower body clothing?: None 6 Click Score: 24   End of Session    Activity Tolerance: Patient tolerated treatment well Patient left: in bed;with call bell/phone within reach  OT Visit Diagnosis: Other abnormalities of gait and mobility (R26.89);Pain Pain - Right/Left: Right Pain - part of body: Leg;Ankle and joints of foot                Time: 0981-1914 OT Time Calculation (min): 15 min Charges:  OT General Charges $OT Visit: 1 Visit OT Evaluation $OT Eval Low Complexity: 1 Low Lindon Romp OT Acute Rehabilitation Services Office [phone]    Evette Georges 10/18/2023, 2:19 PM

## 2023-10-18 NOTE — Evaluation (Signed)
Physical Therapy Evaluation Patient Details Name: Carolyn Zimmerman MRN: [id number] DOB: 12/27/1985 Today's Date: 10/18/2023  History of Present Illness  38 y.o. female who presented to emergency department with nausea, vomiting, chills, fever, malaise and RLE tenderness with erythema/edema and admitted for sepsis secondary to cellulitis. Pt with medical history significant of anxiety, depression, coronavirus infection, left MCA watershed CVA, hyperlipidemia, hypertension, hypertensive heart disease, chronic combined systolic heart failure with EF of 40 to 45% in June 2024, jaundice, class III obesity, tobacco use, history of acute pancreatitis, insulin-dependent type 2 diabetes,  tubo-ovarian abscess, hidradenitis suppurativa, chest wall abscesses, history of sepsis secondary to necrotizing fasciitis due to panniculitis with abdominal wall abscess requiring panniculectomy  Clinical Impression  Pt admitted with above diagnosis. Pt ambulated 94' with RW with minimal weight bearing on RLE 2* pain, distance limited by cramping pain in RLE. RN notified of request for muscle relaxer. Good progress expected with improved pain control.  Pt currently with functional limitations due to the deficits listed below (see PT Problem List). Pt will benefit from acute skilled PT to increase their independence and safety with mobility to allow discharge.           If plan is discharge home, recommend the following: A little help with walking and/or transfers;A little help with bathing/dressing/bathroom;Assistance with cooking/housework;Help with stairs or ramp for entrance   Can travel by private vehicle        Equipment Recommendations Rolling walker (2 wheels)  Recommendations for Other Services       Functional Status Assessment Patient has had a recent decline in their functional status and demonstrates the ability to make significant improvements in function in a reasonable and predictable amount of time.      Precautions / Restrictions Precautions Precautions: Fall Precaution Comments: denies falls in past 6 months Restrictions Weight Bearing Restrictions Per Provider Order: No      Mobility  Bed Mobility Overal bed mobility: Modified Independent             General bed mobility comments: HOB up, used rail    Transfers Overall transfer level: Needs assistance Equipment used: Rolling walker (2 wheels) Transfers: Sit to/from Stand Sit to Stand: Min assist           General transfer comment: VCs hand placement, min A to power up    Ambulation/Gait Ambulation/Gait assistance: Contact guard assist Gait Distance (Feet): 35 Feet Assistive device: Rolling walker (2 wheels) Gait Pattern/deviations: Step-to pattern, Decreased weight shift to right Gait velocity: decr     General Gait Details: pt minimally WBing on RLE 2* pain, distance limited by RLE muscle cramping, nurse notified of request for muscle relaxors  Stairs            Wheelchair Mobility     Tilt Bed    Modified Rankin (Stroke Patients Only)       Balance Overall balance assessment: Needs assistance Sitting-balance support: Feet supported, No upper extremity supported Sitting balance-Leahy Scale: Good     Standing balance support: Bilateral upper extremity supported, During functional activity, Reliant on assistive device for balance Standing balance-Leahy Scale: Poor                               Pertinent Vitals/Pain Pain Assessment Pain Assessment: 0-10 Pain Score: 6  Pain Location: RLE Pain Descriptors / Indicators: Cramping Pain Intervention(s): Limited activity within patient's tolerance, Monitored during session, Premedicated before session, Patient requesting  pain meds-RN notified, Repositioned    Home Living Family/patient expects to be discharged to:: Private residence Living Arrangements: Children   Type of Home: House Home Access: Level entry     Alternate  Level Stairs-Number of Steps: 10 Home Layout: Two level Home Equipment: None      Prior Function Prior Level of Function : Independent/Modified Independent             Mobility Comments: walks without AD, no falls in past 6 months, works at [employer] (standing) ADLs Comments: independent     Extremity/Trunk Assessment   Upper Extremity Assessment Upper Extremity Assessment: Defer to OT evaluation    Lower Extremity Assessment Lower Extremity Assessment: RLE deficits/detail RLE Deficits / Details: knee ext AROM -20* limited by pain RLE: Unable to fully assess due to pain RLE Sensation: WNL    Cervical / Trunk Assessment Cervical / Trunk Assessment: Normal  Communication   Communication Communication: No apparent difficulties  Cognition Arousal: Alert Behavior During Therapy: WFL for tasks assessed/performed Overall Cognitive Status: Within Functional Limits for tasks assessed                                          General Comments      Exercises     Assessment/Plan    PT Assessment Patient needs continued PT services  PT Problem List Decreased activity tolerance       PT Treatment Interventions Gait training;Therapeutic exercise;DME instruction;Therapeutic activities;Patient/family education    PT Goals (Current goals can be found in the Care Plan section)  Acute Rehab PT Goals Patient Stated Goal: return to work at [employer] PT Goal Formulation: With patient Time For Goal Achievement: 11/01/23 Potential to Achieve Goals: Good    Frequency Min 1X/week     Co-evaluation               AM-PAC PT "6 Clicks" Mobility  Outcome Measure Help needed turning from your back to your side while in a flat bed without using bedrails?: A Little Help needed moving from lying on your back to sitting on the side of a flat bed without using bedrails?: A Little Help needed moving to and from a bed to a chair (including a wheelchair)?: A  Little Help needed standing up from a chair using your arms (e.g., wheelchair or bedside chair)?: A Little Help needed to walk in hospital room?: A Little Help needed climbing 3-5 steps with a railing? : A Lot 6 Click Score: 17    End of Session Equipment Utilized During Treatment: Gait belt Activity Tolerance: Patient limited by pain Patient left: in chair;with call bell/phone within reach;with chair alarm set Nurse Communication: Mobility status;Patient requests pain meds PT Visit Diagnosis: Difficulty in walking, not elsewhere classified (R26.2);Pain Pain - Right/Left: Right Pain - part of body: Leg    Time: 6962-9528 PT Time Calculation (min) (ACUTE ONLY): 19 min   Charges:   PT Evaluation $PT Eval Moderate Complexity: 1 Mod   PT General Charges $$ ACUTE PT VISIT: 1 Visit        Tamala Ser PT 10/18/2023  Acute Rehabilitation Services  Office [phone]

## 2023-10-19 LAB — CBC WITH DIFFERENTIAL/PLATELET
Abs Immature Granulocytes: 0.25 10*3/uL — ABNORMAL HIGH (ref 0.00–0.07)
Basophils Absolute: 0.1 10*3/uL (ref 0.0–0.1)
Basophils Relative: 0 %
Eosinophils Absolute: 0.2 10*3/uL (ref 0.0–0.5)
Eosinophils Relative: 1 %
HCT: 33.4 % — ABNORMAL LOW (ref 36.0–46.0)
Hemoglobin: 10.5 g/dL — ABNORMAL LOW (ref 12.0–15.0)
Immature Granulocytes: 2 %
Lymphocytes Relative: 27 %
Lymphs Abs: 3.1 10*3/uL (ref 0.7–4.0)
MCH: 25.7 pg — ABNORMAL LOW (ref 26.0–34.0)
MCHC: 31.4 g/dL (ref 30.0–36.0)
MCV: 81.7 fL (ref 80.0–100.0)
Monocytes Absolute: 0.9 10*3/uL (ref 0.1–1.0)
Monocytes Relative: 8 %
Neutro Abs: 6.9 10*3/uL (ref 1.7–7.7)
Neutrophils Relative %: 62 %
Platelets: 368 10*3/uL (ref 150–400)
RBC: 4.09 MIL/uL (ref 3.87–5.11)
RDW: 14.5 % (ref 11.5–15.5)
WBC: 11.3 10*3/uL — ABNORMAL HIGH (ref 4.0–10.5)
nRBC: 0 % (ref 0.0–0.2)

## 2023-10-19 LAB — BASIC METABOLIC PANEL
Anion gap: 10 (ref 5–15)
BUN: 10 mg/dL (ref 6–20)
CO2: 26 mmol/L (ref 22–32)
Calcium: 8.3 mg/dL — ABNORMAL LOW (ref 8.9–10.3)
Chloride: 103 mmol/L (ref 98–111)
Creatinine, Ser: 0.77 mg/dL (ref 0.44–1.00)
GFR, Estimated: >60 mL/min (ref >60)
Glucose, Bld: 133 mg/dL — ABNORMAL HIGH (ref 70–99)
Potassium: 3.7 mmol/L (ref 3.5–5.1)
Sodium: 139 mmol/L (ref 135–145)

## 2023-10-19 LAB — GLUCOSE, CAPILLARY
Glucose-Capillary: 131 mg/dL — ABNORMAL HIGH (ref 70–99)
Glucose-Capillary: 141 mg/dL — ABNORMAL HIGH (ref 70–99)
Glucose-Capillary: 152 mg/dL — ABNORMAL HIGH (ref 70–99)
Glucose-Capillary: 154 mg/dL — ABNORMAL HIGH (ref 70–99)

## 2023-10-19 NOTE — Plan of Care (Signed)
  Problem: Education: Goal: Ability to describe self-care measures that may prevent or decrease complications (Diabetes Survival Skills Education) will improve Outcome: Progressing Goal: Individualized Educational Video(s) Outcome: Progressing   Problem: Coping: Goal: Ability to adjust to condition or change in health will improve Outcome: Progressing   Problem: Fluid Volume: Goal: Ability to maintain a balanced intake and output will improve Outcome: Progressing   Problem: Health Behavior/Discharge Planning: Goal: Ability to identify and utilize available resources and services will improve Outcome: Progressing Goal: Ability to manage health-related needs will improve Outcome: Progressing   Problem: Metabolic: Goal: Ability to maintain appropriate glucose levels will improve Outcome: Progressing   Problem: Nutritional: Goal: Maintenance of adequate nutrition will improve Outcome: Progressing Goal: Progress toward achieving an optimal weight will improve Outcome: Progressing   Problem: Skin Integrity: Goal: Risk for impaired skin integrity will decrease Outcome: Progressing   Problem: Tissue Perfusion: Goal: Adequacy of tissue perfusion will improve Outcome: Progressing   Problem: Education: Goal: Ability to describe self-care measures that may prevent or decrease complications (Diabetes Survival Skills Education) will improve Outcome: Progressing Goal: Individualized Educational Video(s) Outcome: Progressing

## 2023-10-19 NOTE — Progress Notes (Signed)
PROGRESS NOTE                                                                                                                                                                                                             Patient Demographics:    Carolyn Zimmerman, is a 38 y.o. female, DOB - Mar 13, 1986, ZOX:[id number]  Outpatient Primary MD for the patient is Ivonne Andrew, NP    LOS - 7  Admit date - 10/11/2023    Chief Complaint  Patient presents with   Nausea       Brief Narrative (HPI from H&P)   Carolyn Zimmerman is a 38 y.o. female with medical history significant of anxiety, depression, coronavirus infection, left MCA watershed CVA, hyperlipidemia, hypertension, hypertensive heart disease, chronic combined systolic heart failure with EF of 40 to 45% in June 2024, jaundice, class III obesity, tobacco use, history of acute pancreatitis, insulin-dependent type 2 diabetes,  tubo-ovarian abscess, hidradenitis suppurativa, chest wall abscesses, history of sepsis secondary to necrotizing fasciitis due to panniculitis with abdominal wall abscess requiring panniculectomy who presented to emergency department with nausea, vomiting, chills, fever, malaise and RLE tenderness with erythema/edema and admitted for sepsis secondary to cellulitis.  The patient was lying quietly in bed upon my visit. Once I entered the room the patient began to moan and cry out with pain when asked to move to allow me to examine her leg. Her right leg is much less swollen. It is no longer warm. She cries out with pain during palpation much more than yesterday. Her WBC has increased again from 13.1 to 15.1. US of the right lower extremity was performed and did not demonstrate an abscess. Antibiotics have been changed from ceftriaxone to ancef and vancomycin.. On 10/19/2023 the patient's WBC is decreased. She continues to have pain in her right leg.   Subjective:    Carolyn Zimmerman Pt is resting comfortably, but continues to complain of pain in the right leg.   Assessment  & Plan :    Assessment and Plan: Sepsis secondary to cellulitis - Resolved. The patient presented on 10/11/2023 with fever of with temp greater than 100.2, a greater than 20 mmHg drop in systolic blood pressure, tachypnea, and tachycardia in the setting of cellulitis of the right lower extremity. WBC was 34.4 upon admission. It then declined to  RLE cellulitis -The right lower extremity is without erythema, warmth, or swelling. It remains tender to touch, but less so.   -Antibiotics changed to ancef and vancomycin due to persistent severe pain and elevated WBC. -WBC has decreased from 13.1 to 11.3. It had been 34.4 on presentation. -RLE ultrasound has ruled out DVT on 10/14/2023. -Pain is controlled on oxycodone.   T2DM, uncontrolled A1c of 11.7% on admission in the setting of not taking any insulin or diabetes medication over the last 3 months.  -Blood sugars have been well controlled over the past 24 hours. -Resume home metformin -Increase Semglee from 25 to 30 units daily -Continue SSI with meals, CBG monitoring -Diabetic educator visited the patient today.  HTN SBP remains elevated yesterday.   The Coreg was increased to 37.5 bid. Monitor. Start irbesartan 300 mg daily (substitute for home valsartan 320 mg)  HFrEF Last TTE in June 2024 showed EF 40-45%, LV global hypokinesis, moderate eccentric LVH, G2DD, and severely dilated LA. patient has received 7.5 L of fluid since admission with urine output of Foley 1.5 L.  She is not in respiratory distress but is slightly fluid overloaded.  GDMT previously included losartan, spironolactone, Coreg and torsemide however patient has not taken these medications for the last 3 months. -Telemetry -Resume torsemide 40 mg daily -Continue beta-blocker and ARB as above -Trend and replete electrolytes -Strict I&O's, daily weights. Likely  not accurate. According to this data the patient is positive 7 liters since admission.   Bilateral onychomycosis Poor podiatric hygiene Severe callouses with cracking of soles and heels The patient asked how she got the cellulitis and the infection that caused it. I examined her feet. While her toenails have been painted, it is easy to see the debris of fungal infection under the toenails from the underside of her feet. Her feet are dirty with severe callouses and deep cracks in her heels and her soles. I discussed with the patient the importance of caring for her feet diligently and consistently each morning and night. She was also admonished to wear hard soled shoes at all times. Once she is recovered from this infection, she should be started on lamisil by her PCP. Perhaps she should start to see podiatry to maintain the health of her feet. I also exhorted the importance of keeping her glucoses under control.  HLD Hx CVA -Resume aspirin and rosuvastatin -Check lipid panel  Tobacco use disorder -Continue nicotine patch  Hypomagnesemia, resolved Hypokalemia, resolved Hypophosphatemia, continue phosphorus tablets, follow-up repeat phosphorus tomorrow  Social determinants of health Reports financial difficulties and food insecurity.  She has had difficulty affording her medications over the last 3 months. -TOC consulted, appreciate assistance        Morbid obesity: Estimated body mass index is 49.79 kg/m as calculated from the following:   Height as of this encounter: 5\' 7"  (1.702 m).   Weight as of this encounter: 144.2 kg.  -Follow-up with PCP for nutrition and weight loss counseling        Condition -improving  Family Communication  : None  Code Status : Full code  Consults  : None  PUD Prophylaxis : None   Procedures  :     None      Disposition Plan  :    Status is: Inpatient Remains inpatient appropriate because: Treatment for cellulitis, RLE swelling and  pain  DVT Prophylaxis  :  Lovenox    Lab Results  Component Value Date   PLT 368 10/19/2023  Diet :  Diet Order             Diet heart healthy/carb modified Fluid consistency: Thin; Fluid restriction: 1500 mL Fluid  Diet effective now                    Inpatient Medications  Scheduled Meds:  aspirin  81 mg Oral Daily   carvedilol  37.5 mg Oral BID WC   enoxaparin (LOVENOX) injection  70 mg Subcutaneous Q24H   insulin aspart  0-20 Units Subcutaneous TID WC   insulin glargine-yfgn  30 Units Subcutaneous Daily   irbesartan  300 mg Oral Daily   metFORMIN  1,000 mg Oral BID WC   nicotine  21 mg Transdermal Daily   rosuvastatin  20 mg Oral Daily   torsemide  40 mg Oral Daily   Continuous Infusions:   ceFAZolin (ANCEF) IV Stopped (10/19/23 1356)   PRN Meds:.acetaminophen **OR** acetaminophen, cyclobenzaprine, hydrALAZINE, ibuprofen, ondansetron **OR** ondansetron (ZOFRAN) IV, oxyCODONE, prochlorperazine  Antibiotics  :    Anti-infectives (From admission, onward)    Start     Dose/Rate Route Frequency Ordered Stop   10/18/23 1400  ceFAZolin (ANCEF) IVPB 2g/100 mL premix        2 g 200 mL/hr over 30 Minutes Intravenous Every 8 hours 10/18/23 0937 10/25/23 1359   10/12/23 0800  cefTRIAXone (ROCEPHIN) 2 g in sodium chloride 0.9 % 100 mL IVPB  Status:  Discontinued        2 g 200 mL/hr over 30 Minutes Intravenous Every 24 hours 10/12/23 0754 10/18/23 0937   10/12/23 0030  vancomycin (VANCOCIN) IVPB 1000 mg/200 mL premix       Placed in "Followed by" Linked Group   1,000 mg 200 mL/hr over 60 Minutes Intravenous  Once 10/11/23 2317 10/12/23 0302   10/11/23 2330  vancomycin (VANCOCIN) IVPB 1000 mg/200 mL premix       Placed in "Followed by" Linked Group   1,000 mg 200 mL/hr over 60 Minutes Intravenous  Once 10/11/23 2317 10/12/23 0139   10/11/23 2300  ceFEPIme (MAXIPIME) 2 g in sodium chloride 0.9 % 100 mL IVPB        2 g 200 mL/hr over 30 Minutes Intravenous  Once  10/11/23 2258 10/12/23 0035   10/11/23 2300  metroNIDAZOLE (FLAGYL) IVPB 500 mg        500 mg 100 mL/hr over 60 Minutes Intravenous  Once 10/11/23 2258 10/12/23 0139   10/11/23 2300  vancomycin (VANCOCIN) IVPB 1000 mg/200 mL premix  Status:  Discontinued        1,000 mg 200 mL/hr over 60 Minutes Intravenous  Once 10/11/23 2258 10/11/23 2314         Objective:   Vitals:   10/18/23 2244 10/19/23 0426 10/19/23 0825 10/19/23 1554  BP: (!) 178/88 (!) 166/88 (!) 183/79 (!) 168/96  Pulse: 76 79  83  Resp: 16 16  18   Temp: 98.4 F (36.9 C) 97.8 F (36.6 C)  97.8 F (36.6 C)  TempSrc:      SpO2: 99% 98%  99%  Weight:      Height:        Wt Readings from Last 3 Encounters:  10/17/23 (!) 144.2 kg  10/04/23 126.1 kg  05/31/23 126 kg     Intake/Output Summary (Last 24 hours) at 10/19/2023 1711 Last data filed at 10/19/2023 1500 Gross per 24 hour  Intake 300 ml  Output --  Net 300 ml  Exam:  Constitutional:  The patient is awake, alert, and oriented x 3. Mild distress from discomfort. Respiratory:  No increased work of breathing. No wheezes, rales, or rhonchi No tactile fremitus Cardiovascular:  Regular rate and rhythm No murmurs, ectopy, or gallups. No lateral PMI. No thrills. Abdomen:  Abdomen is soft, non-tender, non-distended No hernias, masses, or organomegaly Normoactive bowel sounds.  Musculoskeletal:  Left lower extremity without cyanosis or clubbing. No erythema or warmth. Non-tender. Right lower extremity has no warmth or erythema but remains tender to touch  Patient states that pain is improved. Skin:  No rashes, lesions, ulcers palpation of skin: no induration or nodules, but patient cries out in pain. Swelling is resolved.  Erythema and warmth of right lower extremity is resolved. Toenails are painted on feet bilaterally. There is visible debris of fungal infection in the space beneath her toenails.  Soles and heels of the patient's feet are  filthy, badly calloused with deep cracks in soles and heels. Neurologic:  CN 2-12 intact Sensation all 4 extremities intact Psychiatric:  Mental status Mood, affect appropriate Orientation to person, place, time  judgment and insight appear intact       Data Review:    Recent Labs  Lab 10/15/23 0512 10/16/23 0609 10/17/23 0717 10/18/23 0559 10/19/23 0912  WBC 10.6* 10.7* 13.1* 15.1* 11.3*  HGB 9.0* 9.9* 10.2* 10.5* 10.5*  HCT 29.8* 32.5* 33.1* 33.8* 33.4*  PLT 285 319 370 356 368  MCV 84.4 83.5 83.8 82.2 81.7  MCH 25.5* 25.4* 25.8* 25.5* 25.7*  MCHC 30.2 30.5 30.8 31.1 31.4  RDW 14.4 14.4 14.1 14.4 14.5  LYMPHSABS 3.0 2.5 3.6 3.5 3.1  MONOABS 1.0 0.9 1.0 1.1* 0.9  EOSABS 0.1 0.1 0.2 0.2 0.2  BASOSABS 0.0 0.0 0.1 0.1 0.1    Recent Labs  Lab 10/13/23 0652 10/14/23 0554 10/15/23 0512 10/16/23 0609 10/17/23 0717 10/18/23 0559 10/19/23 0912  NA 134* 135 135 134* 140 140 139  K 4.0 3.3* 3.1* 3.2* 3.4* 3.8 3.7  CL 102 103 103 102 104 104 103  CO2 23 25 23 23 24 24 26   ANIONGAP 9 7 9 9 12 12 10   GLUCOSE 225* 162* 163* 245* 112* 214* 133*  BUN 11 14 11 11 9 13 10   CREATININE 0.86 0.73 0.77 0.82 0.60 0.79 0.77  AST 18  --   --   --   --   --   --   ALT 18  --   --   --   --   --   --   ALKPHOS 82  --   --   --   --   --   --   BILITOT 0.5  --   --   --   --   --   --   ALBUMIN 2.1*  --   --   --   --   --   --   MG 2.2 1.9  --   --   --   --   --   CALCIUM 7.7* 7.5* 7.7* 7.9* 8.5* 8.6* 8.3*      Recent Labs  Lab 10/13/23 0652 10/14/23 0554 10/15/23 0512 10/16/23 0609 10/17/23 0717 10/18/23 0559 10/19/23 0912  MG 2.2 1.9  --   --   --   --   --   CALCIUM 7.7* 7.5* 7.7* 7.9* 8.5* 8.6* 8.3*    --------------------------------------------------------------------------------------------------------------- Lab Results  Component Value Date   CHOL 143 10/14/2023  HDL 29 (L) 10/14/2023   LDLCALC 69 10/14/2023   LDLDIRECT 64.0 03/26/2023   TRIG 224 (H)  10/14/2023   CHOLHDL 4.9 10/14/2023    Lab Results  Component Value Date   HGBA1C 11.7 (H) 10/12/2023   No results for input(s): "TSH", "T4TOTAL", "FREET4", "T3FREE", "THYROIDAB" in the last 72 hours. No results for input(s): "VITAMINB12", "FOLATE", "FERRITIN", "TIBC", "IRON", "RETICCTPCT" in the last 72 hours. ------------------------------------------------------------------------------------------------------------------ Cardiac Enzymes No results for input(s): "CKMB", "TROPONINI", "MYOGLOBIN" in the last 168 hours.  Invalid input(s): "CK"  Micro Results Recent Results (from the past 240 hours)  Resp panel by RT-PCR (RSV, Flu A&B, Covid) Anterior Nasal Swab     Status: None   Collection Time: 10/11/23  4:58 PM   Specimen: Anterior Nasal Swab  Result Value Ref Range Status   SARS Coronavirus 2 by RT PCR NEGATIVE NEGATIVE Final    Comment: (NOTE) SARS-CoV-2 target nucleic acids are NOT DETECTED.  The SARS-CoV-2 RNA is generally detectable in upper respiratory specimens during the acute phase of infection. The lowest concentration of SARS-CoV-2 viral copies this assay can detect is 138 copies/mL. A negative result does not preclude SARS-Cov-2 infection and should not be used as the sole basis for treatment or other patient management decisions. A negative result may occur with  improper specimen collection/handling, submission of specimen other than nasopharyngeal swab, presence of viral mutation(s) within the areas targeted by this assay, and inadequate number of viral copies(<138 copies/mL). A negative result must be combined with clinical observations, patient history, and epidemiological information. The expected result is Negative.  Fact Sheet for Patients:  BloggerCourse.com  Fact Sheet for Healthcare Providers:  SeriousBroker.it  This test is no t yet approved or cleared by the [place] FDA and  has been  authorized for detection and/or diagnosis of SARS-CoV-2 by FDA under an Emergency Use Authorization (EUA). This EUA will remain  in effect (meaning this test can be used) for the duration of the COVID-19 declaration under Section 564(b)(1) of the Act, 21 U.S.C.section 360bbb-3(b)(1), unless the authorization is terminated  or revoked sooner.       Influenza A by PCR NEGATIVE NEGATIVE Final   Influenza B by PCR NEGATIVE NEGATIVE Final    Comment: (NOTE) The Xpert Xpress SARS-CoV-2/FLU/RSV plus assay is intended as an aid in the diagnosis of influenza from Nasopharyngeal swab specimens and should not be used as a sole basis for treatment. Nasal washings and aspirates are unacceptable for Xpert Xpress SARS-CoV-2/FLU/RSV testing.  Fact Sheet for Patients: BloggerCourse.com  Fact Sheet for Healthcare Providers: SeriousBroker.it  This test is not yet approved or cleared by the [place] FDA and has been authorized for detection and/or diagnosis of SARS-CoV-2 by FDA under an Emergency Use Authorization (EUA). This EUA will remain in effect (meaning this test can be used) for the duration of the COVID-19 declaration under Section 564(b)(1) of the Act, 21 U.S.C. section 360bbb-3(b)(1), unless the authorization is terminated or revoked.     Resp Syncytial Virus by PCR NEGATIVE NEGATIVE Final    Comment: (NOTE) Fact Sheet for Patients: BloggerCourse.com  Fact Sheet for Healthcare Providers: SeriousBroker.it  This test is not yet approved or cleared by the [place] FDA and has been authorized for detection and/or diagnosis of SARS-CoV-2 by FDA under an Emergency Use Authorization (EUA). This EUA will remain in effect (meaning this test can be used) for the duration of the COVID-19 declaration under Section 564(b)(1) of the Act, 21 U.S.C. section 360bbb-3(b)(1), unless  the  authorization is terminated or revoked.  Performed at [facility], [address], [place], [place] [address]   Group A Strep by PCR     Status: None   Collection Time: 10/11/23  4:58 PM   Specimen: Anterior Nasal Swab; Sterile Swab  Result Value Ref Range Status   Group A Strep by PCR NOT DETECTED NOT DETECTED Final    Comment: Performed at [facility], [address] [place], [place] [address]  Blood culture (routine x 2)     Status: None   Collection Time: 10/11/23 11:00 PM   Specimen: BLOOD  Result Value Ref Range Status   Specimen Description   Final    BLOOD LEFT ANTECUBITAL Performed at [facility], [address] [place], [place] [address]    Special Requests   Final    BOTTLES DRAWN AEROBIC AND ANAEROBIC Blood Culture adequate volume Performed at [facility], [address], [place], [place] [address]    Culture   Final    NO GROWTH 5 DAYS Performed at [facility], [address] [place], [place] [address]    Report Status 10/17/2023 FINAL  Final  Blood culture (routine x 2)     Status: None   Collection Time: 10/11/23 11:20 PM   Specimen: BLOOD  Result Value Ref Range Status   Specimen Description   Final    BLOOD BLOOD LEFT HAND Performed at [facility], [address], [place], [place] [address]    Special Requests   Final    BOTTLES DRAWN AEROBIC AND ANAEROBIC Blood Culture adequate volume Performed at [facility], [address] [place], [place] [address]    Culture   Final    NO GROWTH 5 DAYS Performed at [facility], [address], [place], [place] [address]    Report Status 10/17/2023 FINAL  Final    Radiology Reports [place] RT LOWER EXTREM LTD SOFT TISSUE NON VASCULAR Result Date: 10/17/2023 CLINICAL DATA:  Cellulitis and abscess of right leg. EXAM: ULTRASOUND RIGHT LOWER EXTREMITY LIMITED TECHNIQUE: Ultrasound  examination of the lower extremity soft tissues was performed in the area of clinical concern. COMPARISON:  CT 10/11/2023 FINDINGS: Targeted sonographic evaluation in the area of clinical concern labeled right calf. Subcutaneous edema without focal fluid collection. No gross hyperemia. IMPRESSION: Subcutaneous edema without focal fluid collection. Electronically Signed   By: Narda Rutherford M.D.   On: 10/17/2023 18:41       Signature  -   Fran Lowes M.D on 10/19/2023 at 5:11 PM   -  To page go to [email or web address]

## 2023-10-19 NOTE — Progress Notes (Signed)
PT Cancellation Note  Patient Details Name: Ellicia Alix MRN: [id number] DOB: 1986-02-24   Cancelled Treatment:    Reason Eval/Treat Not Completed: Other (comment)  Pt declined PT at this time due to lethargy.  Reports she has been walking in room on her own with RW.  Will f/u later date. Anise Salvo, PT Acute Rehab [facility] [phone]  Rayetta Humphrey 10/19/2023, 12:24 PM

## 2023-10-19 NOTE — TOC Progression Note (Addendum)
Transition of Care [facility]) - Progression Note    Patient Details  Name: Carolyn Zimmerman MRN: [id number] Date of Birth: [birth year]  [facility] CM/SW Contact  Otelia Santee, LCSW Phone Number: 10/19/2023, 11:28 AM  Clinical Narrative:    Per Tresa Endo w/ [facility] she has attempted to reach pt to review finances in order to determine if pt qualifies for their charity HH. Spoke with pt and encouraged her to check her messages and to call [facility] back in order to obtain [facility] services at discharge. Pt agreeable to completing this.   ADDENDUM: Per Tresa Endo with [facility] pt has been accepted for charity HHPT. HH order will need to be placed prior to DC. MD notified.        Expected Discharge Plan and Services                                               Social Determinants of Health (SDOH) Interventions SDOH Screenings   Food Insecurity: Food Insecurity Present (10/12/2023)  Housing: High Risk (10/12/2023)  Transportation Needs: No Transportation Needs (10/12/2023)  Utilities: Not At Risk (10/12/2023)  Depression (PHQ2-9): Low Risk  (05/17/2023)  Financial Resource Strain: Medium Risk (04/28/2022)  Stress: Stress Concern Present (04/28/2022)  Tobacco Use: High Risk (10/12/2023)    Readmission Risk Interventions     No data to display

## 2023-10-20 DIAGNOSIS — R6520 Severe sepsis without septic shock: Secondary | ICD-10-CM | POA: Diagnosis present

## 2023-10-20 DIAGNOSIS — I110 Hypertensive heart disease with heart failure: Secondary | ICD-10-CM | POA: Diagnosis present

## 2023-10-20 DIAGNOSIS — E1169 Type 2 diabetes mellitus with other specified complication: Secondary | ICD-10-CM | POA: Diagnosis present

## 2023-10-20 DIAGNOSIS — Z23 Encounter for immunization: Secondary | ICD-10-CM | POA: Diagnosis not present

## 2023-10-20 DIAGNOSIS — L03115 Cellulitis of right lower limb: Secondary | ICD-10-CM | POA: Diagnosis present

## 2023-10-20 DIAGNOSIS — E1165 Type 2 diabetes mellitus with hyperglycemia: Secondary | ICD-10-CM | POA: Diagnosis present

## 2023-10-20 DIAGNOSIS — E876 Hypokalemia: Secondary | ICD-10-CM | POA: Diagnosis present

## 2023-10-20 DIAGNOSIS — Z794 Long term (current) use of insulin: Secondary | ICD-10-CM | POA: Diagnosis not present

## 2023-10-20 DIAGNOSIS — D638 Anemia in other chronic diseases classified elsewhere: Secondary | ICD-10-CM | POA: Diagnosis present

## 2023-10-20 DIAGNOSIS — Z1152 Encounter for screening for COVID-19: Secondary | ICD-10-CM | POA: Diagnosis not present

## 2023-10-20 DIAGNOSIS — E43 Unspecified severe protein-calorie malnutrition: Secondary | ICD-10-CM | POA: Diagnosis present

## 2023-10-20 DIAGNOSIS — I420 Dilated cardiomyopathy: Secondary | ICD-10-CM | POA: Diagnosis present

## 2023-10-20 DIAGNOSIS — Z8616 Personal history of COVID-19: Secondary | ICD-10-CM | POA: Diagnosis not present

## 2023-10-20 DIAGNOSIS — I5042 Chronic combined systolic (congestive) and diastolic (congestive) heart failure: Secondary | ICD-10-CM | POA: Diagnosis present

## 2023-10-20 DIAGNOSIS — M79604 Pain in right leg: Secondary | ICD-10-CM | POA: Diagnosis present

## 2023-10-20 DIAGNOSIS — E66813 Obesity, class 3: Secondary | ICD-10-CM | POA: Diagnosis present

## 2023-10-20 DIAGNOSIS — F17210 Nicotine dependence, cigarettes, uncomplicated: Secondary | ICD-10-CM | POA: Diagnosis present

## 2023-10-20 DIAGNOSIS — Z6841 Body Mass Index (BMI) 40.0 and over, adult: Secondary | ICD-10-CM | POA: Diagnosis not present

## 2023-10-20 DIAGNOSIS — F32A Depression, unspecified: Secondary | ICD-10-CM | POA: Diagnosis present

## 2023-10-20 DIAGNOSIS — E782 Mixed hyperlipidemia: Secondary | ICD-10-CM | POA: Diagnosis present

## 2023-10-20 DIAGNOSIS — A419 Sepsis, unspecified organism: Secondary | ICD-10-CM | POA: Diagnosis present

## 2023-10-20 DIAGNOSIS — E871 Hypo-osmolality and hyponatremia: Secondary | ICD-10-CM | POA: Diagnosis present

## 2023-10-20 LAB — BASIC METABOLIC PANEL
Anion gap: 8 (ref 5–15)
BUN: 10 mg/dL (ref 6–20)
CO2: 28 mmol/L (ref 22–32)
Calcium: 8.3 mg/dL — ABNORMAL LOW (ref 8.9–10.3)
Chloride: 102 mmol/L (ref 98–111)
Creatinine, Ser: 0.76 mg/dL (ref 0.44–1.00)
GFR, Estimated: >60 mL/min (ref >60)
Glucose, Bld: 125 mg/dL — ABNORMAL HIGH (ref 70–99)
Potassium: 3.5 mmol/L (ref 3.5–5.1)
Sodium: 138 mmol/L (ref 135–145)

## 2023-10-20 LAB — CBC WITH DIFFERENTIAL/PLATELET
Abs Immature Granulocytes: 0.26 10*3/uL — ABNORMAL HIGH (ref 0.00–0.07)
Basophils Absolute: 0.1 10*3/uL (ref 0.0–0.1)
Basophils Relative: 0 %
Eosinophils Absolute: 0.1 10*3/uL (ref 0.0–0.5)
Eosinophils Relative: 1 %
HCT: 33.1 % — ABNORMAL LOW (ref 36.0–46.0)
Hemoglobin: 10.2 g/dL — ABNORMAL LOW (ref 12.0–15.0)
Immature Granulocytes: 2 %
Lymphocytes Relative: 30 %
Lymphs Abs: 3.7 10*3/uL (ref 0.7–4.0)
MCH: 25.5 pg — ABNORMAL LOW (ref 26.0–34.0)
MCHC: 30.8 g/dL (ref 30.0–36.0)
MCV: 82.8 fL (ref 80.0–100.0)
Monocytes Absolute: 1 10*3/uL (ref 0.1–1.0)
Monocytes Relative: 8 %
Neutro Abs: 7.5 10*3/uL (ref 1.7–7.7)
Neutrophils Relative %: 59 %
Platelets: 400 10*3/uL (ref 150–400)
RBC: 4 MIL/uL (ref 3.87–5.11)
RDW: 14.7 % (ref 11.5–15.5)
WBC: 12.6 10*3/uL — ABNORMAL HIGH (ref 4.0–10.5)
nRBC: 0 % (ref 0.0–0.2)

## 2023-10-20 LAB — GLUCOSE, CAPILLARY
Glucose-Capillary: 127 mg/dL — ABNORMAL HIGH (ref 70–99)
Glucose-Capillary: 107 mg/dL — ABNORMAL HIGH (ref 70–99)
Glucose-Capillary: 208 mg/dL — ABNORMAL HIGH (ref 70–99)
Glucose-Capillary: 183 mg/dL — ABNORMAL HIGH (ref 70–99)

## 2023-10-20 NOTE — Progress Notes (Signed)
PROGRESS NOTE                                                                                                                                                                                                             Patient Demographics:    Carolyn Zimmerman, is a 38 y.o. female, DOB - July 20, 1986, TDD:[id number]  Outpatient Primary MD for the patient is Ivonne Andrew, NP    LOS - 8  Admit date - 10/11/2023    Chief Complaint  Patient presents with   Nausea       Brief Narrative (HPI from H&P)   Carolyn Zimmerman is a 38 y.o. female with medical history significant of anxiety, depression, coronavirus infection, left MCA watershed CVA, hyperlipidemia, hypertension, hypertensive heart disease, chronic combined systolic heart failure with EF of 40 to 45% in June 2024, jaundice, class III obesity, tobacco use, history of acute pancreatitis, insulin-dependent type 2 diabetes,  tubo-ovarian abscess, hidradenitis suppurativa, chest wall abscesses, history of sepsis secondary to necrotizing fasciitis due to panniculitis with abdominal wall abscess requiring panniculectomy who presented to emergency department with nausea, vomiting, chills, fever, malaise and RLE tenderness with erythema/edema and admitted for sepsis secondary to cellulitis.  The patient was lying quietly in bed upon my visit. Once I entered the room the patient began to moan and cry out with pain when asked to move to allow me to examine her leg. Her right leg is much less swollen. It is no longer warm. She cries out with pain during palpation much more than yesterday. Her WBC has increased again from 13.1 to 15.1. US of the right lower extremity was performed and did not demonstrate an abscess. Antibiotics have been changed from ceftriaxone to ancef and vancomycin.. The patient's WBC has been difficult to track on 1/31 it dropped from 15.1 to 11.3 after three days of incresing  counts. Then on 2.1 the WBC again increases to 12.6. Clinically the patient appears improved.  The patient states that the pain in her leg is better. She is ambulating in her room.   If her WBC continues to improve, I anticipate that she may be discharged to home on 10/21/2023.  Subjective:   Carolyn Zimmerman Pt is resting comfortably.   Assessment  & Plan :    Assessment and Plan: Sepsis secondary to cellulitis - Resolved.  The patient presented on 10/11/2023 with fever of with temp greater than 100.2, a greater than 20 mmHg drop in systolic blood pressure, tachypnea, and tachycardia in the setting of cellulitis of the right lower extremity. WBC was 34.4 upon admission. The patient's WBC has been difficult to track on 1/31 it dropped from 15.1 to 11.3 after three days of incresing counts. Then on 2.1 the WBC again increases to 12.6. Clinically the patient appears improved.  RLE cellulitis -The right lower extremity is without erythema, warmth, or swelling. It remains tender to touch, but less so.   -Antibiotics changed to ancef and vancomycin due to persistent severe pain and elevated WBC. -WBC has decreased from 15.1 to 11.3 on 1/31. It had been 34.4 on presentation. On 2/1 it has increased again to 12.6. Continue to monitor. -RLE ultrasound has ruled out DVT on 10/14/2023. No abscess found on ultrasond of leg on 10/18/2023 -Pain is controlled on oxycodone.   T2DM, uncontrolled A1c of 11.7% on admission in the setting of not taking any insulin or diabetes medication over the last 3 months.  -Blood sugars have been well controlled over the past 24 hours. -Resume home metformin -Increase Semglee from 25 to 30 units daily -Continue SSI with meals, CBG monitoring -Diabetic educator visited the patient today.  HTN SBP remains elevated yesterday.   The Coreg was increased to 37.5 bid. Monitor. Start irbesartan 300 mg daily (substitute for home valsartan 320 mg)  HFrEF Last TTE in June 2024  showed EF 40-45%, LV global hypokinesis, moderate eccentric LVH, G2DD, and severely dilated LA. patient has received 7.5 L of fluid since admission with urine output of Foley 1.5 L.  She is not in respiratory distress but is slightly fluid overloaded.  GDMT previously included losartan, spironolactone, Coreg and torsemide however patient has not taken these medications for the last 3 months. -Telemetry -Resume torsemide 40 mg daily -Continue beta-blocker and ARB as above -Trend and replete electrolytes -Strict I&O's, daily weights. Likely not accurate. According to this data the patient is positive 7 liters since admission.   Bilateral onychomycosis Poor podiatric hygiene Severe callouses with cracking of soles and heels The patient asked how she got the cellulitis and the infection that caused it. I examined her feet. While her toenails have been painted, it is easy to see the debris of fungal infection under the toenails from the underside of her feet. Her feet are dirty with severe callouses and deep cracks in her heels and her soles. I discussed with the patient the importance of caring for her feet diligently and consistently each morning and night. She was also admonished to wear hard soled shoes at all times. Once she is recovered from this infection, she should be started on lamisil by her PCP. Perhaps she should start to see podiatry to maintain the health of her feet. I also exhorted the importance of keeping her glucoses under control.  HLD Hx CVA -Resume aspirin and rosuvastatin -Check lipid panel  Tobacco use disorder -Continue nicotine patch  Hypomagnesemia, resolved Hypokalemia, resolved Hypophosphatemia, continue phosphorus tablets, follow-up repeat phosphorus tomorrow  Social determinants of health Reports financial difficulties and food insecurity.  She has had difficulty affording her medications over the last 3 months. -TOC consulted, appreciate assistance         Morbid obesity: Estimated body mass index is 49.79 kg/m as calculated from the following:   Height as of this encounter: 5\' 7"  (1.702 m).   Weight as of this encounter:  144.2 kg.  -Follow-up with PCP for nutrition and weight loss counseling        Condition -improving  Family Communication  : None  Code Status : Full code  Consults  : None  PUD Prophylaxis : None   Procedures  :     None      Disposition Plan  :  Home  Status is: Inpatient Remains inpatient appropriate because: Treatment for cellulitis, RLE swelling and pain  DVT Prophylaxis  :  Lovenox    Lab Results  Component Value Date   PLT 400 10/20/2023    Diet :  Diet Order             Diet heart healthy/carb modified Fluid consistency: Thin; Fluid restriction: 1500 mL Fluid  Diet effective now                    Inpatient Medications  Scheduled Meds:  aspirin  81 mg Oral Daily   carvedilol  37.5 mg Oral BID WC   enoxaparin (LOVENOX) injection  70 mg Subcutaneous Q24H   insulin aspart  0-20 Units Subcutaneous TID WC   insulin glargine-yfgn  30 Units Subcutaneous Daily   irbesartan  300 mg Oral Daily   metFORMIN  1,000 mg Oral BID WC   nicotine  21 mg Transdermal Daily   rosuvastatin  20 mg Oral Daily   torsemide  40 mg Oral Daily   Continuous Infusions:   ceFAZolin (ANCEF) IV 2 g (10/20/23 1440)   PRN Meds:.acetaminophen **OR** acetaminophen, cyclobenzaprine, hydrALAZINE, ibuprofen, ondansetron **OR** ondansetron (ZOFRAN) IV, oxyCODONE, prochlorperazine  Antibiotics  :    Anti-infectives (From admission, onward)    Start     Dose/Rate Route Frequency Ordered Stop   10/18/23 1400  ceFAZolin (ANCEF) IVPB 2g/100 mL premix        2 g 200 mL/hr over 30 Minutes Intravenous Every 8 hours 10/18/23 0937 10/25/23 1359   10/12/23 0800  cefTRIAXone (ROCEPHIN) 2 g in sodium chloride 0.9 % 100 mL IVPB  Status:  Discontinued        2 g 200 mL/hr over 30 Minutes Intravenous Every 24 hours  10/12/23 0754 10/18/23 0937   10/12/23 0030  vancomycin (VANCOCIN) IVPB 1000 mg/200 mL premix       Placed in "Followed by" Linked Group   1,000 mg 200 mL/hr over 60 Minutes Intravenous  Once 10/11/23 2317 10/12/23 0302   10/11/23 2330  vancomycin (VANCOCIN) IVPB 1000 mg/200 mL premix       Placed in "Followed by" Linked Group   1,000 mg 200 mL/hr over 60 Minutes Intravenous  Once 10/11/23 2317 10/12/23 0139   10/11/23 2300  ceFEPIme (MAXIPIME) 2 g in sodium chloride 0.9 % 100 mL IVPB        2 g 200 mL/hr over 30 Minutes Intravenous  Once 10/11/23 2258 10/12/23 0035   10/11/23 2300  metroNIDAZOLE (FLAGYL) IVPB 500 mg        500 mg 100 mL/hr over 60 Minutes Intravenous  Once 10/11/23 2258 10/12/23 0139   10/11/23 2300  vancomycin (VANCOCIN) IVPB 1000 mg/200 mL premix  Status:  Discontinued        1,000 mg 200 mL/hr over 60 Minutes Intravenous  Once 10/11/23 2258 10/11/23 2314         Objective:   Vitals:   10/19/23 1554 10/19/23 1946 10/20/23 0448 10/20/23 1300  BP: (!) 168/96 (!) 185/85 (!) 178/89 (!) 150/94  Pulse: 83 86 77 84  Resp: 18 16 18 18   Temp: 97.8 F (36.6 C) 98.6 F (37 C) (!) 97.2 F (36.2 C) 97.9 F (36.6 C)  TempSrc:      SpO2: 99% 99% 100% 97%  Weight:      Height:        Wt Readings from Last 3 Encounters:  10/17/23 (!) 144.2 kg  10/04/23 126.1 kg  05/31/23 126 kg    No intake or output data in the 24 hours ending 10/20/23 1805    Exam:  Constitutional:  The patient is awake, alert, and oriented x 3. Mild distress from discomfort. Respiratory:  No increased work of breathing. No wheezes, rales, or rhonchi No tactile fremitus Cardiovascular:  Regular rate and rhythm No murmurs, ectopy, or gallups. No lateral PMI. No thrills. Abdomen:  Abdomen is soft, non-tender, non-distended No hernias, masses, or organomegaly Normoactive bowel sounds.  Musculoskeletal:  Left lower extremity without cyanosis or clubbing. No erythema or warmth.  Non-tender. Right lower extremity has no warmth or erythema but remains tender to touch  Patient states that pain is improved. Skin:  No rashes, lesions, ulcers palpation of skin: no induration or nodules, but patient cries out in pain. Swelling is resolved.  Erythema and warmth of right lower extremity is resolved. Toenails are painted on feet bilaterally. There is visible debris of fungal infection in the space beneath her toenails.  Soles and heels of the patient's feet are filthy, badly calloused with deep cracks in soles and heels. Neurologic:  CN 2-12 intact Sensation all 4 extremities intact Psychiatric:  Mental status Mood, affect appropriate Orientation to person, place, time  judgment and insight appear intact       Data Review:    Recent Labs  Lab 10/16/23 0609 10/17/23 0717 10/18/23 0559 10/19/23 0912 10/20/23 0650  WBC 10.7* 13.1* 15.1* 11.3* 12.6*  HGB 9.9* 10.2* 10.5* 10.5* 10.2*  HCT 32.5* 33.1* 33.8* 33.4* 33.1*  PLT 319 370 356 368 400  MCV 83.5 83.8 82.2 81.7 82.8  MCH 25.4* 25.8* 25.5* 25.7* 25.5*  MCHC 30.5 30.8 31.1 31.4 30.8  RDW 14.4 14.1 14.4 14.5 14.7  LYMPHSABS 2.5 3.6 3.5 3.1 3.7  MONOABS 0.9 1.0 1.1* 0.9 1.0  EOSABS 0.1 0.2 0.2 0.2 0.1  BASOSABS 0.0 0.1 0.1 0.1 0.1    Recent Labs  Lab 10/14/23 0554 10/15/23 0512 10/16/23 0609 10/17/23 0717 10/18/23 0559 10/19/23 0912 10/20/23 0650  NA 135   < > 134* 140 140 139 138  K 3.3*   < > 3.2* 3.4* 3.8 3.7 3.5  CL 103   < > 102 104 104 103 102  CO2 25   < > 23 24 24 26 28   ANIONGAP 7   < > 9 12 12 10 8   GLUCOSE 162*   < > 245* 112* 214* 133* 125*  BUN 14   < > 11 9 13 10 10   CREATININE 0.73   < > 0.82 0.60 0.79 0.77 0.76  MG 1.9  --   --   --   --   --   --   CALCIUM 7.5*   < > 7.9* 8.5* 8.6* 8.3* 8.3*   < > = values in this interval not displayed.      Recent Labs  Lab 10/14/23 1610 10/15/23 9604 10/16/23 5409 10/17/23 0717 10/18/23 0559 10/19/23 0912 10/20/23 0650  MG  1.9  --   --   --   --   --   --  CALCIUM 7.5*   < > 7.9* 8.5* 8.6* 8.3* 8.3*   < > = values in this interval not displayed.    --------------------------------------------------------------------------------------------------------------- Lab Results  Component Value Date   CHOL 143 10/14/2023   HDL 29 (L) 10/14/2023   LDLCALC 69 10/14/2023   LDLDIRECT 64.0 03/26/2023   TRIG 224 (H) 10/14/2023   CHOLHDL 4.9 10/14/2023    Lab Results  Component Value Date   HGBA1C 11.7 (H) 10/12/2023   No results for input(s): "TSH", "T4TOTAL", "FREET4", "T3FREE", "THYROIDAB" in the last 72 hours. No results for input(s): "VITAMINB12", "FOLATE", "FERRITIN", "TIBC", "IRON", "RETICCTPCT" in the last 72 hours. ------------------------------------------------------------------------------------------------------------------ Cardiac Enzymes No results for input(s): "CKMB", "TROPONINI", "MYOGLOBIN" in the last 168 hours.  Invalid input(s): "CK"  Micro Results Recent Results (from the past 240 hours)  Resp panel by RT-PCR (RSV, Flu A&B, Covid) Anterior Nasal Swab     Status: None   Collection Time: 10/11/23  4:58 PM   Specimen: Anterior Nasal Swab  Result Value Ref Range Status   SARS Coronavirus 2 by RT PCR NEGATIVE NEGATIVE Final    Comment: (NOTE) SARS-CoV-2 target nucleic acids are NOT DETECTED.  The SARS-CoV-2 RNA is generally detectable in upper respiratory specimens during the acute phase of infection. The lowest concentration of SARS-CoV-2 viral copies this assay can detect is 138 copies/mL. A negative result does not preclude SARS-Cov-2 infection and should not be used as the sole basis for treatment or other patient management decisions. A negative result may occur with  improper specimen collection/handling, submission of specimen other than nasopharyngeal swab, presence of viral mutation(s) within the areas targeted by this assay, and inadequate number of viral copies(<138  copies/mL). A negative result must be combined with clinical observations, patient history, and epidemiological information. The expected result is Negative.  Fact Sheet for Patients:  BloggerCourse.com  Fact Sheet for Healthcare Providers:  SeriousBroker.it  This test is no t yet approved or cleared by the [place] FDA and  has been authorized for detection and/or diagnosis of SARS-CoV-2 by FDA under an Emergency Use Authorization (EUA). This EUA will remain  in effect (meaning this test can be used) for the duration of the COVID-19 declaration under Section 564(b)(1) of the Act, 21 U.S.C.section 360bbb-3(b)(1), unless the authorization is terminated  or revoked sooner.       Influenza A by PCR NEGATIVE NEGATIVE Final   Influenza B by PCR NEGATIVE NEGATIVE Final    Comment: (NOTE) The Xpert Xpress SARS-CoV-2/FLU/RSV plus assay is intended as an aid in the diagnosis of influenza from Nasopharyngeal swab specimens and should not be used as a sole basis for treatment. Nasal washings and aspirates are unacceptable for Xpert Xpress SARS-CoV-2/FLU/RSV testing.  Fact Sheet for Patients: BloggerCourse.com  Fact Sheet for Healthcare Providers: SeriousBroker.it  This test is not yet approved or cleared by the [place] FDA and has been authorized for detection and/or diagnosis of SARS-CoV-2 by FDA under an Emergency Use Authorization (EUA). This EUA will remain in effect (meaning this test can be used) for the duration of the COVID-19 declaration under Section 564(b)(1) of the Act, 21 U.S.C. section 360bbb-3(b)(1), unless the authorization is terminated or revoked.     Resp Syncytial Virus by PCR NEGATIVE NEGATIVE Final    Comment: (NOTE) Fact Sheet for Patients: BloggerCourse.com  Fact Sheet for Healthcare  Providers: SeriousBroker.it  This test is not yet approved or cleared by the [place] FDA and has been authorized for detection and/or diagnosis of  SARS-CoV-2 by FDA under an Emergency Use Authorization (EUA). This EUA will remain in effect (meaning this test can be used) for the duration of the COVID-19 declaration under Section 564(b)(1) of the Act, 21 U.S.C. section 360bbb-3(b)(1), unless the authorization is terminated or revoked.  Performed at [facility], [address], [place], [place] [address]   Group A Strep by PCR     Status: None   Collection Time: 10/11/23  4:58 PM   Specimen: Anterior Nasal Swab; Sterile Swab  Result Value Ref Range Status   Group A Strep by PCR NOT DETECTED NOT DETECTED Final    Comment: Performed at [facility], [address] [place], [place] [address]  Blood culture (routine x 2)     Status: None   Collection Time: 10/11/23 11:00 PM   Specimen: BLOOD  Result Value Ref Range Status   Specimen Description   Final    BLOOD LEFT ANTECUBITAL Performed at [facility], [address], [place], [place] [address]    Special Requests   Final    BOTTLES DRAWN AEROBIC AND ANAEROBIC Blood Culture adequate volume Performed at [facility], [address] [place], [place] [address]    Culture   Final    NO GROWTH 5 DAYS Performed at [facility], [address], [place], [place] [address]    Report Status 10/17/2023 FINAL  Final  Blood culture (routine x 2)     Status: None   Collection Time: 10/11/23 11:20 PM   Specimen: BLOOD  Result Value Ref Range Status   Specimen Description   Final    BLOOD BLOOD LEFT HAND Performed at [facility], [address] [place], [place] [address]    Special Requests   Final    BOTTLES DRAWN AEROBIC AND ANAEROBIC Blood Culture adequate volume Performed at [facility], [address] [place], [place] [address]    Culture   Final    NO GROWTH 5 DAYS Performed at [facility], [address] [place], [place] [address]    Report Status 10/17/2023 FINAL  Final    Radiology Reports [place] RT LOWER EXTREM LTD SOFT TISSUE NON VASCULAR Result Date: 10/17/2023 CLINICAL DATA:  Cellulitis and abscess of right leg. EXAM: ULTRASOUND RIGHT LOWER EXTREMITY LIMITED TECHNIQUE: Ultrasound examination of the lower extremity soft tissues was performed in the area of clinical concern. COMPARISON:  CT 10/11/2023 FINDINGS: Targeted sonographic evaluation in the area of clinical concern labeled right calf. Subcutaneous edema without focal fluid collection. No gross hyperemia. IMPRESSION: Subcutaneous edema without focal fluid collection. Electronically Signed   By: Narda Rutherford M.D.   On: 10/17/2023 18:41       Signature  -   Fran Lowes M.D on 10/20/2023 at 6:05 PM   -  To page go to [email or web address]

## 2023-10-20 NOTE — Plan of Care (Signed)
  Problem: Education: Goal: Ability to describe self-care measures that may prevent or decrease complications (Diabetes Survival Skills Education) will improve Outcome: Progressing Goal: Individualized Educational Video(s) Outcome: Progressing   Problem: Coping: Goal: Ability to adjust to condition or change in health will improve Outcome: Progressing   Problem: Fluid Volume: Goal: Ability to maintain a balanced intake and output will improve Outcome: Progressing   Problem: Health Behavior/Discharge Planning: Goal: Ability to identify and utilize available resources and services will improve Outcome: Progressing Goal: Ability to manage health-related needs will improve Outcome: Progressing   Problem: Metabolic: Goal: Ability to maintain appropriate glucose levels will improve Outcome: Progressing   Problem: Nutritional: Goal: Maintenance of adequate nutrition will improve Outcome: Progressing Goal: Progress toward achieving an optimal weight will improve Outcome: Progressing   Problem: Skin Integrity: Goal: Risk for impaired skin integrity will decrease Outcome: Progressing   Problem: Tissue Perfusion: Goal: Adequacy of tissue perfusion will improve Outcome: Progressing   Problem: Education: Goal: Knowledge of General Education information will improve Description: Including pain rating scale, medication(s)/side effects and non-pharmacologic comfort measures Outcome: Progressing   Problem: Health Behavior/Discharge Planning: Goal: Ability to manage health-related needs will improve Outcome: Progressing   Problem: Clinical Measurements: Goal: Ability to maintain clinical measurements within normal limits will improve Outcome: Progressing Goal: Will remain free from infection Outcome: Progressing Goal: Diagnostic test results will improve Outcome: Progressing Goal: Respiratory complications will improve Outcome: Progressing Goal: Cardiovascular complication will  be avoided Outcome: Progressing   Problem: Activity: Goal: Risk for activity intolerance will decrease Outcome: Progressing   Problem: Nutrition: Goal: Adequate nutrition will be maintained Outcome: Progressing   Problem: Coping: Goal: Level of anxiety will decrease Outcome: Progressing   Problem: Elimination: Goal: Will not experience complications related to bowel motility Outcome: Progressing Goal: Will not experience complications related to urinary retention Outcome: Progressing   Problem: Pain Managment: Goal: General experience of comfort will improve and/or be controlled Outcome: Progressing   Problem: Safety: Goal: Ability to remain free from injury will improve Outcome: Progressing   Problem: Skin Integrity: Goal: Risk for impaired skin integrity will decrease Outcome: Progressing   Problem: Education: Goal: Ability to describe self-care measures that may prevent or decrease complications (Diabetes Survival Skills Education) will improve Outcome: Progressing Goal: Individualized Educational Video(s) Outcome: Progressing   Problem: Coping: Goal: Ability to adjust to condition or change in health will improve Outcome: Progressing   Problem: Fluid Volume: Goal: Ability to maintain a balanced intake and output will improve Outcome: Progressing   Problem: Health Behavior/Discharge Planning: Goal: Ability to identify and utilize available resources and services will improve Outcome: Progressing Goal: Ability to manage health-related needs will improve Outcome: Progressing   Problem: Metabolic: Goal: Ability to maintain appropriate glucose levels will improve Outcome: Progressing   Problem: Nutritional: Goal: Maintenance of adequate nutrition will improve Outcome: Progressing Goal: Progress toward achieving an optimal weight will improve Outcome: Progressing   Problem: Skin Integrity: Goal: Risk for impaired skin integrity will decrease Outcome:  Progressing   Problem: Tissue Perfusion: Goal: Adequacy of tissue perfusion will improve Outcome: Progressing   Problem: Clinical Measurements: Goal: Ability to avoid or minimize complications of infection will improve Outcome: Progressing   Problem: Skin Integrity: Goal: Skin integrity will improve Outcome: Progressing

## 2023-10-21 DIAGNOSIS — E119 Type 2 diabetes mellitus without complications: Secondary | ICD-10-CM

## 2023-10-21 DIAGNOSIS — I10 Essential (primary) hypertension: Secondary | ICD-10-CM

## 2023-10-21 DIAGNOSIS — E1165 Type 2 diabetes mellitus with hyperglycemia: Secondary | ICD-10-CM

## 2023-10-21 DIAGNOSIS — R6510 Systemic inflammatory response syndrome (SIRS) of non-infectious origin without acute organ dysfunction: Secondary | ICD-10-CM

## 2023-10-21 DIAGNOSIS — F419 Anxiety disorder, unspecified: Secondary | ICD-10-CM

## 2023-10-21 DIAGNOSIS — F32A Depression, unspecified: Secondary | ICD-10-CM

## 2023-10-21 DIAGNOSIS — Z8673 Personal history of transient ischemic attack (TIA), and cerebral infarction without residual deficits: Secondary | ICD-10-CM

## 2023-10-21 DIAGNOSIS — Z794 Long term (current) use of insulin: Secondary | ICD-10-CM

## 2023-10-21 DIAGNOSIS — Z91148 Patient's other noncompliance with medication regimen for other reason: Secondary | ICD-10-CM

## 2023-10-21 LAB — CBC WITH DIFFERENTIAL/PLATELET
Abs Immature Granulocytes: 0.17 10*3/uL — ABNORMAL HIGH (ref 0.00–0.07)
Basophils Absolute: 0.1 10*3/uL (ref 0.0–0.1)
Basophils Relative: 1 %
Eosinophils Absolute: 0.1 10*3/uL (ref 0.0–0.5)
Eosinophils Relative: 1 %
HCT: 32.5 % — ABNORMAL LOW (ref 36.0–46.0)
Hemoglobin: 9.7 g/dL — ABNORMAL LOW (ref 12.0–15.0)
Immature Granulocytes: 1 %
Lymphocytes Relative: 27 %
Lymphs Abs: 3.2 10*3/uL (ref 0.7–4.0)
MCH: 24.8 pg — ABNORMAL LOW (ref 26.0–34.0)
MCHC: 29.8 g/dL — ABNORMAL LOW (ref 30.0–36.0)
MCV: 83.1 fL (ref 80.0–100.0)
Monocytes Absolute: 0.9 10*3/uL (ref 0.1–1.0)
Monocytes Relative: 7 %
Neutro Abs: 7.6 10*3/uL (ref 1.7–7.7)
Neutrophils Relative %: 63 %
Platelets: 364 10*3/uL (ref 150–400)
RBC: 3.91 MIL/uL (ref 3.87–5.11)
RDW: 14.7 % (ref 11.5–15.5)
WBC: 12.1 10*3/uL — ABNORMAL HIGH (ref 4.0–10.5)
nRBC: 0 % (ref 0.0–0.2)

## 2023-10-21 LAB — BASIC METABOLIC PANEL
Anion gap: 8 (ref 5–15)
BUN: 10 mg/dL (ref 6–20)
CO2: 27 mmol/L (ref 22–32)
Calcium: 8.1 mg/dL — ABNORMAL LOW (ref 8.9–10.3)
Chloride: 100 mmol/L (ref 98–111)
Creatinine, Ser: 0.83 mg/dL (ref 0.44–1.00)
GFR, Estimated: >60 mL/min (ref >60)
Glucose, Bld: 208 mg/dL — ABNORMAL HIGH (ref 70–99)
Potassium: 3.8 mmol/L (ref 3.5–5.1)
Sodium: 135 mmol/L (ref 135–145)

## 2023-10-21 LAB — GLUCOSE, CAPILLARY: Glucose-Capillary: 183 mg/dL — ABNORMAL HIGH (ref 70–99)

## 2023-10-21 MED ORDER — CEFAZOLIN SODIUM-DEXTROSE 2-4 GM/100ML-% IV SOLN
2.0000 g | Freq: Three times a day (TID) | INTRAVENOUS | Status: DC
  Administered 2023-10-21: 2 g via INTRAVENOUS
  Filled 2023-10-21: qty 100

## 2023-10-21 MED ORDER — DOXYCYCLINE HYCLATE 100 MG PO TABS
100.0000 mg | ORAL_TABLET | Freq: Two times a day (BID) | ORAL | 0 refills | Status: DC
  Filled 2023-10-21: qty 14, 7d supply, fill #0

## 2023-10-21 MED ORDER — SPIRONOLACTONE 25 MG PO TABS
50.0000 mg | ORAL_TABLET | Freq: Every day | ORAL | Status: DC
  Administered 2023-10-21: 50 mg via ORAL
  Filled 2023-10-21: qty 2

## 2023-10-21 NOTE — Discharge Summary (Signed)
Physician Discharge Summary   Patient: Carolyn Zimmerman MRN: [id number] DOB: 1985/10/22  Admit date:     10/11/2023  Discharge date: 10/21/23  Discharge Physician: Arnetha Courser   PCP: Ivonne Andrew, NP   Recommendations at discharge:  Please obtain CBC and BMP on follow-up Patient need further counseling to stay compliant with her medication, her antihypertensives and diabetes medications need to be reassessed.  She uses different medications randomly and not as advised and her blood pressure remained elevated. Please ensure completion of antibiotics for lower extremity cellulitis. Follow-up with primary care provider within a week  Discharge Diagnoses: Principal Problem:   Cellulitis of right lower extremity Active Problems:   Hx of medication noncompliance   Uncontrolled diabetes mellitus with hyperglycemia (HCC)   Obesity, Class III, BMI 40-49.9 (morbid obesity) (HCC)   Sepsis (HCC)   Mixed hyperlipidemia   Anxiety   Depression   History of CVA (cerebrovascular accident)   Tobacco abuse   Hypomagnesemia   Hypophosphatemia   Hypokalemia   Protein-calorie malnutrition, severe (HCC)   Hypertension   Leukocytosis   SIRS (systemic inflammatory response syndrome) (HCC)  Resolved Problems:   * No resolved hospital problems. *  Hospital Course: Carolyn Zimmerman is a 38 y.o. female with medical history significant of anxiety, depression, coronavirus infection, left MCA watershed CVA, hyperlipidemia, hypertension, hypertensive heart disease, chronic combined systolic heart failure with EF of 40 to 45% in June 2024, jaundice, class III obesity, tobacco use, history of acute pancreatitis, insulin-dependent type 2 diabetes,  tubo-ovarian abscess, hidradenitis suppurativa, chest wall abscesses, history of sepsis secondary to necrotizing fasciitis due to panniculitis with abdominal wall abscess requiring panniculectomy who presented to emergency department with nausea, vomiting, chills, fever,  malaise and RLE tenderness with erythema/edema and admitted for sepsis secondary to cellulitis.  Due to worsening pain lower extremity ultrasound was done which was negative for any abscess formation.  She was initially treated with ceftriaxone followed by Ancef and vancomycin.  Clinically improved.  Slowly improving leukocytosis.  No lower extremity tenderness or pain on the day of discharge.  Patient was given 1 more week of doxycycline to complete the course.  Blood pressure intermittently elevated.  Multiple antihypertensives on her chart.  Apparently she was not taking all of them consistently, she takes here and there to preserve her supply due to financial reason.  She was counseled about the importance of keeping the blood pressure and diabetes under good control and need persistent of counseling and reevaluation by PCP.  Continuing all of those medications in her chart except discontinuing furosemide as she is on torsemide.  Her PCP can do a proper med rec and eliminate unnecessary medications.  Patient has an history of HFrEF and dilated cardiomyopathy.  She should continue her torsemide, beta-blocker, ARB and spironolactone.  Apparently she was not taking them appropriately.  She was also noted to have bilateral onychomycosis and poor podiatry hygiene.  This will make her vulnerable for diabetic foot ulcers.  She will get benefit from seeing a podiatrist.  Patient was also found to have multiple electrolyte deficiencies which were corrected before discharge.  Patient is also morbidly obese and need counseling for weight loss to decrease the risk of further complications.  Patient should continue on current medications and need to have a close follow-up with her providers for further management.   Consultants: None Procedures performed: None Disposition: Home Diet recommendation:  Discharge Diet Orders (From admission, onward)     Start  Ordered   10/21/23 0000  Diet - low sodium  heart healthy        10/21/23 1008           Cardiac and Carb modified diet DISCHARGE MEDICATION: Allergies as of 10/21/2023       Reactions   Contrast Media [iodinated Contrast Media] Hives, Swelling        Medication List     STOP taking these medications    furosemide 20 MG tablet Commonly known as: LASIX   hydrOXYzine 10 MG tablet Commonly known as: ATARAX   ibuprofen 600 MG tablet Commonly known as: ADVIL       TAKE these medications    acetaminophen 500 MG tablet Commonly known as: TYLENOL Take 2 tablets (1,000 mg total) by mouth every 8 (eight) hours as needed for mild pain.   albuterol 108 (90 Base) MCG/ACT inhaler Commonly known as: VENTOLIN HFA Inhale 2 puffs into the lungs every 4 (four) hours as needed for wheezing or shortness of breath.   Aleve 220 MG tablet Generic drug: naproxen sodium Take 220-440 mg by mouth 2 (two) times daily as needed (for headaches or mild pain).   amLODipine 10 MG tablet Commonly known as: NORVASC Take 1 tablet (10 mg total) by mouth daily.   Aspirin Low Dose 81 MG chewable tablet Generic drug: aspirin Chew 1 tablet (81 mg total) by mouth daily.   blood glucose meter kit and supplies Dispense based on patient and insurance preference. Use up to four times daily as directed. (FOR ICD-9 250.00, 250.01).   carvedilol 25 MG tablet Commonly known as: COREG Take 1 tablet (25 mg total) by mouth 2 (two) times daily.   Dexcom G7 Sensor Misc use to monitor blood glucose   doxycycline 100 MG tablet Commonly known as: VIBRA-TABS Take 1 tablet (100 mg total) by mouth 2 (two) times daily for 7 days.   hydrochlorothiazide 12.5 MG capsule Commonly known as: MICROZIDE Take 2 capsules (25 mg total) by mouth daily. What changed: how much to take   insulin lispro 100 UNIT/ML KwikPen Commonly known as: HUMALOG Inject 10 Units into the skin 3 (three) times daily.   Lantus SoloStar 100 UNIT/ML Solostar Pen Generic drug:  insulin glargine Inject 25 Units into the skin daily.   metFORMIN 500 MG 24 hr tablet Commonly known as: GLUCOPHAGE-XR Take 2 tablets (1,000 mg total) by mouth in the morning and at bedtime.   nicotine 14 mg/24hr patch Commonly known as: NICODERM CQ - dosed in mg/24 hours Place 1 patch (14 mg total) onto the skin daily.   rosuvastatin 20 MG tablet Commonly known as: CRESTOR Take 1 tablet (20 mg total) by mouth daily.   spironolactone 50 MG tablet Commonly known as: ALDACTONE Take 1 tablet (50 mg total) by mouth daily.   torsemide 20 MG tablet Commonly known as: DEMADEX Take 2 tablets (40 mg total) by mouth daily.   valsartan 320 MG tablet Commonly known as: DIOVAN Take 1 tablet (320 mg total) by mouth daily.               Durable Medical Equipment  (From admission, onward)           Start     Ordered   10/18/23 1055  For home use only DME Walker rolling  Once       Question Answer Comment  Walker: With 5 Inch Wheels   Patient needs a walker to treat with the following condition Difficulty in  walking, not elsewhere classified      10/18/23 1054            Follow-up Information     [facility] Follow [facility].   Specialty: Pharmacy Why: Please go to this pharmacy location for income based medication assistance Contact information: [address] [place] [address] [phone]        [facility]. Go on 10/29/2023.   Specialty: Internal Medicine Why: You have an appointment on Monday, 10/29/23 at 10:40am. Please arrive 15 minutes early to complete new patient paperwork. Contact information: [address] [place] [address] [phone] Additional information: [address] [place], [place] [address]               Discharge Exam: Ceasar Mons Weights   10/16/23 0500 10/17/23 0500 10/21/23 3244  Weight: 135.9 kg (!) 144.2 kg (!) 140 kg    General.  Morbidly obese lady, in no acute distress. Pulmonary.  Lungs clear bilaterally, normal respiratory effort. CV.  Regular rate and rhythm, no JVD, rub or murmur. Abdomen.  Soft, nontender, nondistended, BS positive. CNS.  Alert and oriented .  No focal neurologic deficit. Extremities.  No edema, no cyanosis, pulses intact and symmetrical. Psychiatry.  Judgment and insight appears normal.   Condition at discharge: stable  The results of significant diagnostics from this hospitalization (including imaging, microbiology, ancillary and laboratory) are listed below for reference.   Imaging Studies: [place] RT LOWER EXTREM LTD SOFT TISSUE NON VASCULAR Result Date: 10/17/2023 CLINICAL DATA:  Cellulitis and abscess of right leg. EXAM: ULTRASOUND RIGHT LOWER EXTREMITY LIMITED TECHNIQUE: Ultrasound examination of the lower extremity soft tissues was performed in the area of clinical concern. COMPARISON:  CT 10/11/2023 FINDINGS: Targeted sonographic evaluation in the area of clinical concern labeled right calf. Subcutaneous edema without focal fluid collection. No gross hyperemia. IMPRESSION: Subcutaneous edema without focal fluid collection. Electronically Signed   By: Narda Rutherford M.D.   On: 10/17/2023 18:41   VAS [place] LOWER EXTREMITY VENOUS (DVT) Result Date: 10/14/2023  Lower Venous DVT Study Patient Name:  KINZLEE SELVY  Date of Exam:   10/13/2023 Medical Rec #: [id number]     Accession #:    [id number] Date of Birth: [birth year]     Patient Gender: F Patient Age:   4 years Exam Location:  [facility] Procedure:      VAS [place] LOWER EXTREMITY VENOUS (DVT) Referring Phys: PROSPER AMPONSAH --------------------------------------------------------------------------------  Indications: Pain.  Risk Factors: None identified. Limitations: Body habitus, poor ultrasound/tissue interface and patient pain tolerance. Comparison Study: No prior studies. Performing Technologist: Chanda Busing RVT   Examination Guidelines: A complete evaluation includes B-mode imaging, spectral Doppler, color Doppler, and power Doppler as needed of all accessible portions of each vessel. Bilateral testing is considered an integral part of a complete examination. Limited examinations for reoccurring indications may be performed as noted. The reflux portion of the exam is performed with the patient in reverse Trendelenburg.  +---------+---------------+---------+-----------+----------+-------------------+ RIGHT    CompressibilityPhasicitySpontaneityPropertiesThrombus Aging      +---------+---------------+---------+-----------+----------+-------------------+ CFV      Full           Yes      Yes                                      +---------+---------------+---------+-----------+----------+-------------------+ SFJ  Full                                                             +---------+---------------+---------+-----------+----------+-------------------+ FV Prox  Full                                                             +---------+---------------+---------+-----------+----------+-------------------+ FV Mid   Full                                                             +---------+---------------+---------+-----------+----------+-------------------+ FV DistalFull                                                             +---------+---------------+---------+-----------+----------+-------------------+ PFV      Full                                                             +---------+---------------+---------+-----------+----------+-------------------+ POP      Full           Yes      Yes                                      +---------+---------------+---------+-----------+----------+-------------------+ PTV                                                   Not well visualized  +---------+---------------+---------+-----------+----------+-------------------+ PERO                                                  Not well visualized +---------+---------------+---------+-----------+----------+-------------------+   +----+---------------+---------+-----------+----------+--------------+ LEFTCompressibilityPhasicitySpontaneityPropertiesThrombus Aging +----+---------------+---------+-----------+----------+--------------+ CFV Full           Yes      Yes                                 +----+---------------+---------+-----------+----------+--------------+     Summary: RIGHT: - There is no evidence of deep vein thrombosis in the lower extremity. However, portions of this examination were limited- see technologist comments above.  - No cystic structure found in the popliteal fossa.  LEFT: - No evidence of  common femoral vein obstruction.   *See table(s) above for measurements and observations. Electronically signed by Carolynn Sayers on 10/14/2023 at 9:11:38 AM.    Final    CT TIBIA FIBULA RIGHT WO CONTRAST Result Date: 10/12/2023 CLINICAL DATA:  Necrotizing fasciitis suspected. Foot x-ray done. History of nec fasc. Leukocytosis. EXAM: CT OF THE LOWER RIGHT EXTREMITY WITHOUT CONTRAST TECHNIQUE: Multidetector CT imaging of the right lower extremity was performed according to the standard protocol. RADIATION DOSE REDUCTION: This exam was performed according to the departmental dose-optimization program which includes automated exposure control, adjustment of the mA and/or kV according to patient size and/or use of iterative reconstruction technique. COMPARISON:  Same day radiographs FINDINGS: Bones/Joint/Cartilage No acute fracture or dislocation. No knee joint effusion. No evidence of osteomyelitis. Ligaments Suboptimally assessed by CT. Muscles and Tendons No evidence of deep tissue infection. Soft tissues Bilateral inguinal lymphadenopathy. 1.9 cm left inguinal lymph node on  series 5/image 57 and 2.0 cm right inguinal node on 5/63. There is perilymphatic fat stranding about the right inguinal lymph nodes. Additional enlarged right external iliac node (series 5/image 42) measuring 13 mm. Subcutaneous edema about the right lower extremity greatest in the calf. No soft tissue gas. No abscess. IMPRESSION: 1. Subcutaneous edema about the right lower extremity greatest in the calf is nonspecific but can be seen with cellulitis. No soft tissue gas. No abscess. 2. Bilateral inguinal lymphadenopathy with perilymphatic fat stranding about the right inguinal lymph nodes. Additional enlarged right external iliac node. These are likely reactive. Electronically Signed   By: Minerva Fester M.D.   On: 10/12/2023 00:12   CT FEMUR RIGHT WO CONTRAST Result Date: 10/12/2023 CLINICAL DATA:  Necrotizing fasciitis suspected. Foot x-ray done. History of nec fasc. Leukocytosis. EXAM: CT OF THE LOWER RIGHT EXTREMITY WITHOUT CONTRAST TECHNIQUE: Multidetector CT imaging of the right lower extremity was performed according to the standard protocol. RADIATION DOSE REDUCTION: This exam was performed according to the departmental dose-optimization program which includes automated exposure control, adjustment of the mA and/or kV according to patient size and/or use of iterative reconstruction technique. COMPARISON:  Same day radiographs FINDINGS: Bones/Joint/Cartilage No acute fracture or dislocation. No knee joint effusion. No evidence of osteomyelitis. Ligaments Suboptimally assessed by CT. Muscles and Tendons No evidence of deep tissue infection. Soft tissues Bilateral inguinal lymphadenopathy. 1.9 cm left inguinal lymph node on series 5/image 57 and 2.0 cm right inguinal node on 5/63. There is perilymphatic fat stranding about the right inguinal lymph nodes. Additional enlarged right external iliac node (series 5/image 42) measuring 13 mm. Subcutaneous edema about the right lower extremity greatest in the calf.  No soft tissue gas. No abscess. IMPRESSION: 1. Subcutaneous edema about the right lower extremity greatest in the calf is nonspecific but can be seen with cellulitis. No soft tissue gas. No abscess. 2. Bilateral inguinal lymphadenopathy with perilymphatic fat stranding about the right inguinal lymph nodes. Additional enlarged right external iliac node. These are likely reactive. Electronically Signed   By: Minerva Fester M.D.   On: 10/12/2023 00:12   DG Tibia/Fibula Right Result Date: 10/11/2023 CLINICAL DATA:  Leg pain EXAM: RIGHT TIBIA AND FIBULA - 2 VIEW COMPARISON:  None Available. FINDINGS: No fracture or malalignment. Extensive subcutaneous edema. No osseous erosive change IMPRESSION: No acute osseous abnormality Electronically Signed   By: Jasmine Pang M.D.   On: 10/11/2023 23:00   DG Femur Min 2 Views Right Result Date: 10/11/2023 CLINICAL DATA:  Leg pain fever EXAM: RIGHT FEMUR 2 VIEWS COMPARISON:  None Available. FINDINGS: There is no evidence of fracture or other focal bone lesions. Soft tissues are unremarkable. IMPRESSION: Negative. Electronically Signed   By: Jasmine Pang M.D.   On: 10/11/2023 22:59   DG Chest 2 View Result Date: 10/11/2023 CLINICAL DATA:  Fever and flu-like symptoms. EXAM: CHEST - 2 VIEW COMPARISON:  04/28/2023 FINDINGS: Stable cardiomegaly. Pulmonary vascular congestion. Left mid lung atelectasis/scarring. The lungs are otherwise clear. No pleural effusion or pneumothorax. IMPRESSION: Cardiomegaly and pulmonary vascular congestion. Electronically Signed   By: Minerva Fester M.D.   On: 10/11/2023 20:17    Microbiology: Results for orders placed or performed during the hospital encounter of 10/11/23  Resp panel by RT-PCR (RSV, Flu A&B, Covid) Anterior Nasal Swab     Status: None   Collection Time: 10/11/23  4:58 PM   Specimen: Anterior Nasal Swab  Result Value Ref Range Status   SARS Coronavirus 2 by RT PCR NEGATIVE NEGATIVE Final    Comment: (NOTE) SARS-CoV-2  target nucleic acids are NOT DETECTED.  The SARS-CoV-2 RNA is generally detectable in upper respiratory specimens during the acute phase of infection. The lowest concentration of SARS-CoV-2 viral copies this assay can detect is 138 copies/mL. A negative result does not preclude SARS-Cov-2 infection and should not be used as the sole basis for treatment or other patient management decisions. A negative result may occur with  improper specimen collection/handling, submission of specimen other than nasopharyngeal swab, presence of viral mutation(s) within the areas targeted by this assay, and inadequate number of viral copies(<138 copies/mL). A negative result must be combined with clinical observations, patient history, and epidemiological information. The expected result is Negative.  Fact Sheet for Patients:  BloggerCourse.com  Fact Sheet for Healthcare Providers:  SeriousBroker.it  This test is no t yet approved or cleared by the [place] FDA and  has been authorized for detection and/or diagnosis of SARS-CoV-2 by FDA under an Emergency Use Authorization (EUA). This EUA will remain  in effect (meaning this test can be used) for the duration of the COVID-19 declaration under Section 564(b)(1) of the Act, 21 U.S.C.section 360bbb-3(b)(1), unless the authorization is terminated  or revoked sooner.       Influenza A by PCR NEGATIVE NEGATIVE Final   Influenza B by PCR NEGATIVE NEGATIVE Final    Comment: (NOTE) The Xpert Xpress SARS-CoV-2/FLU/RSV plus assay is intended as an aid in the diagnosis of influenza from Nasopharyngeal swab specimens and should not be used as a sole basis for treatment. Nasal washings and aspirates are unacceptable for Xpert Xpress SARS-CoV-2/FLU/RSV testing.  Fact Sheet for Patients: BloggerCourse.com  Fact Sheet for Healthcare  Providers: SeriousBroker.it  This test is not yet approved or cleared by the [place] FDA and has been authorized for detection and/or diagnosis of SARS-CoV-2 by FDA under an Emergency Use Authorization (EUA). This EUA will remain in effect (meaning this test can be used) for the duration of the COVID-19 declaration under Section 564(b)(1) of the Act, 21 U.S.C. section 360bbb-3(b)(1), unless the authorization is terminated or revoked.     Resp Syncytial Virus by PCR NEGATIVE NEGATIVE Final    Comment: (NOTE) Fact Sheet for Patients: BloggerCourse.com  Fact Sheet for Healthcare Providers: SeriousBroker.it  This test is not yet approved or cleared by the [place] FDA and has been authorized for detection and/or diagnosis of SARS-CoV-2 by FDA under an Emergency Use Authorization (EUA). This EUA will remain in effect (meaning this test can be used) for the duration of  the COVID-19 declaration under Section 564(b)(1) of the Act, 21 U.S.C. section 360bbb-3(b)(1), unless the authorization is terminated or revoked.  Performed at [facility], [address] [place], [place] [address]   Group A Strep by PCR     Status: None   Collection Time: 10/11/23  4:58 PM   Specimen: Anterior Nasal Swab; Sterile Swab  Result Value Ref Range Status   Group A Strep by PCR NOT DETECTED NOT DETECTED Final    Comment: Performed at [facility], [address] [place], [place] [address]  Blood culture (routine x 2)     Status: None   Collection Time: 10/11/23 11:00 PM   Specimen: BLOOD  Result Value Ref Range Status   Specimen Description   Final    BLOOD LEFT ANTECUBITAL Performed at [facility], [address], [place], [place] [address]    Special Requests   Final    BOTTLES DRAWN AEROBIC AND ANAEROBIC Blood Culture adequate volume Performed at [facility], [address], [place], [place] [address]    Culture   Final    NO GROWTH 5 DAYS Performed at [facility], [address] [place], [place] [address]    Report Status 10/17/2023 FINAL  Final  Blood culture (routine x 2)     Status: None   Collection Time: 10/11/23 11:20 PM   Specimen: BLOOD  Result Value Ref Range Status   Specimen Description   Final    BLOOD BLOOD LEFT HAND Performed at [facility], [address], [place], [place] [address]    Special Requests   Final    BOTTLES DRAWN AEROBIC AND ANAEROBIC Blood Culture adequate volume Performed at [facility], [address], [place], [place] [address]    Culture   Final    NO GROWTH 5 DAYS Performed at [facility], [address] [place], [place] [address]    Report Status 10/17/2023 FINAL  Final    Labs: CBC: Recent Labs  Lab 10/17/23 0717 10/18/23 0559 10/19/23 0912 10/20/23 0650 10/21/23 0548  WBC 13.1* 15.1* 11.3* 12.6* 12.1*  NEUTROABS 8.1* 10.0* 6.9 7.5 7.6  HGB 10.2* 10.5* 10.5* 10.2* 9.7*  HCT 33.1* 33.8* 33.4* 33.1* 32.5*  MCV 83.8 82.2 81.7 82.8 83.1  PLT 370 356 368 400 364   Basic Metabolic Panel: Recent Labs  Lab 10/17/23 0717 10/18/23 0559 10/19/23 0912 10/20/23 0650 10/21/23 0548  NA 140 140 139 138 135  K 3.4* 3.8 3.7 3.5 3.8  CL 104 104 103 102 100  CO2 24 24 26 28 27   GLUCOSE 112* 214* 133* 125* 208*  BUN 9 13 10 10 10   CREATININE 0.60 0.79 0.77 0.76 0.83  CALCIUM 8.5* 8.6* 8.3* 8.3* 8.1*   Liver Function Tests: No results for input(s): "AST", "ALT", "ALKPHOS", "BILITOT", "PROT", "ALBUMIN" in the last 168 hours. CBG: Recent Labs  Lab 10/20/23 0716 10/20/23 1129 10/20/23 1716 10/20/23 2039 10/21/23 0736  GLUCAP 127* 107* 208* 183* 183*    Discharge time spent: greater than 30 minutes.  This record has been created using Conservation officer, historic buildings. Errors have been sought and  corrected,but may not always be located. Such creation errors do not reflect on the standard of care.   Signed: Arnetha Courser, MD [facility] 10/21/2023

## 2023-10-21 NOTE — TOC Transition Note (Signed)
Transition of Care [facility]) - Discharge Note   Patient Details  Name: Carolyn Zimmerman MRN: [id number] Date of Birth: 1985/12/17  [facility] CM/SW Contact:  Adrian Prows, RN Phone Number: 10/21/2023, 11:00 AM   Clinical Narrative:    D/C orders received; HHPT/OT previously arranged w/ [facility]; notified Clifton Custard at agency that pt will d/c today; agency contact info placed in follow up provider section of d/c instructions; no TOC needs.   Final next level of care: Home w Home Health Services Barriers to Discharge: No Barriers Identified   Patient Goals and CMS Choice            Discharge Placement                       Discharge Plan and Services Additional resources added to the After Visit Summary for                            HH Arranged: PT, OT HH Agency: [facility] Date [facility] Agency Contacted: 10/21/23 Time HH Agency Contacted: 1100 Representative spoke with at [facility] Agency: Clifton Custard  Social Drivers of Health (SDOH) Interventions SDOH Screenings   Food Insecurity: Food Insecurity Present (10/12/2023)  Housing: High Risk (10/12/2023)  Transportation Needs: No Transportation Needs (10/12/2023)  Utilities: Not At Risk (10/12/2023)  Depression (PHQ2-9): Low Risk  (05/17/2023)  Financial Resource Strain: Medium Risk (04/28/2022)  Stress: Stress Concern Present (04/28/2022)  Tobacco Use: High Risk (10/12/2023)     Readmission Risk Interventions     No data to display

## 2023-10-22 ENCOUNTER — Telehealth: Payer: Self-pay

## 2023-10-22 ENCOUNTER — Other Ambulatory Visit: Payer: Self-pay

## 2023-10-22 DIAGNOSIS — L03115 Cellulitis of right lower limb: Secondary | ICD-10-CM

## 2023-10-22 DIAGNOSIS — E222 Syndrome of inappropriate secretion of antidiuretic hormone: Secondary | ICD-10-CM

## 2023-10-22 NOTE — Transitions of Care (Post Inpatient/ED Visit) (Signed)
10/22/2023  Name: Carolyn Zimmerman MRN: [id number] DOB: 1986/08/20  Today's TOC FU Call Status: Today's TOC FU Call Status:: Successful TOC FU Call Completed TOC FU Call Complete Date: 10/22/23 Patient's Name and Date of Birth confirmed.  Transition Care Management Follow-up Telephone Call Date of Discharge: 10/21/23 Discharge Facility: [facility])  Items Reviewed: Did you receive and understand the discharge instructions provided?: Yes Medications obtained,verified, and reconciled?: Yes (Medications Reviewed) Any new allergies since your discharge?: No Do you have support at home?: Yes People in Home: child(ren), dependent, sibling(s), other relative(s), parent(s)  Medications Reviewed Today: Medications Reviewed Today     Reviewed by Wyline Mood, RN (Case Manager) on 10/22/23 at 2029  Med List Status: <None>   Medication Order Taking? Sig Documenting Provider Last Dose Status Informant  acetaminophen (TYLENOL) 500 MG tablet [id number] Yes Take 2 tablets (1,000 mg total) by mouth every 8 (eight) hours as needed for mild pain. Kallie Locks, FNP Taking Active Self           Med Note Antony Madura, Arn Medal   Fri Oct 12, 2023  3:46 PM)    albuterol (VENTOLIN HFA) 108 (90 Base) MCG/ACT inhaler [id number] Yes Inhale 2 puffs into the lungs every 4 (four) hours as needed for wheezing or shortness of breath. Kallie Locks, FNP Taking Active Self  ALEVE 220 MG tablet [id number] Yes Take 220-440 mg by mouth 2 (two) times daily as needed (for headaches or mild pain). [provider] Taking Active Self  amLODipine (NORVASC) 10 MG tablet [id number] Yes Take 1 tablet (10 mg total) by mouth daily. Ivonne Andrew, NP Taking Active Self           Med Note Einar Grad Oct 22, 2023  8:28 PM) Patient reports that she is taking her medications but not consistently since losing her job with insurance in October and hasn't been able to work her part time job for 15+ days,  aspirin  81 MG chewable tablet [id number] Yes Chew 1 tablet (81 mg total) by mouth daily. Ivonne Andrew, NP Taking Active Self  blood glucose meter kit and supplies [id number] Yes Dispense based on patient and insurance preference. Use up to four times daily as directed. (FOR ICD-9 250.00, 250.01). Kallie Locks, FNP Taking Active Self  carvedilol (COREG) 25 MG tablet [id number] Yes Take 1 tablet (25 mg total) by mouth 2 (two) times daily. Ivonne Andrew, NP Taking Active Self           Med Note Antony Madura, Arn Medal   Fri Oct 12, 2023  4:17 PM) The patient said, to preserve her medication, she will "take it for awhile, stop, then resume." This looks to have not been filled since August, 2024.  Continuous Glucose Sensor (DEXCOM G7 SENSOR) MISC [id number] Yes use to monitor blood glucose Altamese Tingley, MD Taking Active Self  doxycycline (VIBRA-TABS) 100 MG tablet [id number] No Take 1 tablet (100 mg total) by mouth 2 (two) times daily for 7 days.  Patient not taking: Reported on 10/22/2023   Arnetha Courser, MD Not Taking Active            Med Note Einar Grad Oct 22, 2023  8:29 PM) Plans to pick up the prescription on 10/23/23.  hydrochlorothiazide (MICROZIDE) 12.5 MG capsule [id number] Yes Take 2 capsules (25 mg total) by mouth daily.  Patient taking differently: Take 12.5 mg by mouth daily.   Thomasene Ripple, DO Taking  Active Self           Med Note Antony Madura, Arn Medal   Fri Oct 12, 2023  4:22 PM) The patient said, to preserve her medication, she will "take it for awhile, stop, then resume." This looks to have not been filled since August, 2024.  Insulin Glargine Solostar (LANTUS) 100 UNIT/ML Solostar Pen [id number] No Inject 25 Units into the skin daily.  Patient not taking: Reported on 10/12/2023   Ivonne Andrew, NP Not Taking Active Self  insulin lispro (HUMALOG) 100 UNIT/ML KwikPen [id number] No Inject 10 Units into the skin 3 (three) times daily.  Patient not taking: Reported on 10/12/2023   Ivonne Andrew, NP Not Taking Active Self  metFORMIN (GLUCOPHAGE-XR) 500 MG 24 hr tablet [id number] Yes Take 2 tablets (1,000 mg total) by mouth in the morning and at bedtime. Ivonne Andrew, NP Taking Active Self           Med Note Antony Madura, Arn Medal   Fri Oct 12, 2023  4:21 PM) The patient said, to preserve her medication, she will "take it for awhile, stop, then resume." This looks to have not been filled since August, 2024.  nicotine (NICODERM CQ - DOSED IN MG/24 HOURS) 14 mg/24hr patch [id number] Yes Place 1 patch (14 mg total) onto the skin daily. Ivonne Andrew, NP Taking Active Self           Med Note Antony Madura, Arn Medal   Fri Oct 12, 2023  4:21 PM) The patient noted she uses the 14 mg strength, but a 21 mg one was placed today here.  rosuvastatin (CRESTOR) 20 MG tablet [id number] Yes Take 1 tablet (20 mg total) by mouth daily. Ivonne Andrew, NP Taking Active Self           Med Note Antony Madura, Arn Medal   Fri Oct 12, 2023  4:20 PM) The patient said, to preserve her medication, she will "take it for awhile, stop, then resume." This looks to have not been filled since August, 2024.  spironolactone (ALDACTONE) 50 MG tablet [id number] Yes Take 1 tablet (50 mg total) by mouth daily. Flossie Dibble, NP Taking Active Self           Med Note Antony Madura, Arn Medal   Fri Oct 12, 2023  4:19 PM) The patient said, to preserve her medication, she will "take it for awhile, stop, then resume." This looks to have not been filled since August, 2024.  torsemide (DEMADEX) 20 MG tablet [id number] Yes Take 2 tablets (40 mg total) by mouth daily.  Taking Active Self           Med Note Antony Madura, Arn Medal   Fri Oct 12, 2023  4:18 PM) Patient stated she was told to take this AND Furosemide. I asked. She said, to preserve her medication, she will "take it for awhile, stop, then resume." This looks to have not been filled since August, 2024.  valsartan (DIOVAN) 320 MG tablet [id number] Yes Take 1 tablet (320 mg total) by mouth daily. Ivonne Andrew, NP Taking Active Self           Med Note Antony Madura, Arn Medal   Fri Oct 12, 2023  4:17 PM) The patient said, to preserve her medication, she will "take it for awhile, stop, then resume." This looks to have not been filled since August, 2024.            Home Care and Equipment/Supplies: Were Home  Health Services Ordered?: Yes Name of Home Health Agency:: [facility], [phone] Has Agency set up a time to come to your home?: No EMR reviewed for Home Health Orders:  (HHA called patient's Mom instead of her.  Patient will call them back after our telephone call) Any new equipment or medical supplies ordered?: Yes Name of Medical supply agency?: [facility] Were you able to get the equipment/medical supplies?: No (Submitted AM Ref SW referral to see if there is assistance with obtaining walker) Do you have any questions related to the use of the equipment/supplies?: No  Functional Questionnaire: Do you need assistance with bathing/showering or dressing?: No Do you need assistance with meal preparation?: Yes Do you need assistance with eating?: No Do you have difficulty maintaining continence: No Do you need assistance with getting out of bed/getting out of a chair/moving?: Yes Do you have difficulty managing or taking your medications?: No  Follow up appointments reviewed: PCP Follow-up appointment confirmed?: Yes Follow-up Provider: Patient plans to schedule PCP appt with [facility], Angus Seller, NP, if available.  While still at the hospital an Appt was made for her to F/U with Richarda Blade w/ [facility] Follow-up appointment confirmed?: Yes Date of Specialist follow-up appointment?: 11/29/23 Follow-Up Specialty Provider:: Dr. Jacquelyne Balint, Neurology Do you need transportation to your follow-up appointment?: Yes Transportation Need Intervention Addressed By:: AMB Referral For SDOH Needs Do you understand care options if your  condition(s) worsen?: Yes-patient verbalized understanding  SDOH Interventions Today    Flowsheet Row Most Recent Value  SDOH Interventions   Food Insecurity Interventions AMB Referral  Housing Interventions AMB Referral  Transportation Interventions AMB Referral  [May need transportation to medical appointments until she is able to fully bear weight and walk with ad.]  Utilities Interventions AMB Referral  [Assess for needs due to recent loss of job]  Financial Strain Interventions Artist, Other (Comment)  [AMB Ref]      Interventions Today    Flowsheet Row Most Recent Value  Chronic Disease   Chronic disease during today's visit Diabetes, Congestive Heart Failure (CHF), Hypertension (HTN), Other  [Cellulitis]  General Interventions   General Interventions Discussed/Reviewed General Interventions Discussed, General Interventions Reviewed, Doctor Visits, Communication with  Doctor Visits Discussed/Reviewed Doctor Visits Discussed, Doctor Visits Reviewed, PCP  PCP/Specialist Visits Compliance with follow-up visit  Communication with PCP/Specialists, RN, Pharmacists, Social Work  Exercise Interventions   Exercise Discussed/Reviewed Physical Activity  Physical Activity Discussed/Reviewed Physical Activity Discussed  Education Interventions   Education Provided Provided Education  Provided Verbal Education On Nutrition, Foot Care, Blood Sugar Monitoring, Medication, When to see the doctor, [facility], Other  [Referral to our Ambulatory SW to assist with medicaid application, referral to [facility] to help with medication assistance programs, community resources where patient me able to obtain a walker.]  Nutrition Interventions   Nutrition Discussed/Reviewed Decreasing sugar intake, Nutrition Reviewed, Nutrition Discussed, Carbohydrate meal planning  Pharmacy Interventions   Pharmacy Dicussed/Reviewed Pharmacy Topics Discussed, Pharmacy Topics Reviewed, Medications and  their functions, Medication Adherence, Affording Medications, Referral to Pharmacist  Referral to Pharmacist Cannot afford medications  Safety Interventions   Safety Discussed/Reviewed Fall Risk, Home Safety  Home Safety Assistive Devices, Need for home safety assessment, Refer for community resources       [facility] Interventions Today    Flowsheet Row Most Recent Value  TOC Interventions Patient consents to enrolling in the [facility] 30 day program with weekly nurse calls.  TOC  Interventions Discussed/Reviewed TOC Interventions Discussed, TOC Interventions Reviewed, Contacted Home Health RN/OT/PT, Post discharge activity limitations per provider, S/S of infection and when to call your doctor immediately.    Follow up appointment with [facility] RNCM to check on status of several referrals submitted today and ensure patient has set an appointment to see Warner Mccreedy for post-hospital follow up is:  10/25/22 at 1PM. Next weekly scheduled Telephone visit will be set for 11/02/23 at 10 AM.  Wyline Mood BSN, RN RN Care Manager   Transitions of Care  [place] / [facility], [facility] Direct Dial Number: [phone]  Fax: [phone]

## 2023-10-22 NOTE — Patient Instructions (Signed)
Visit Information  Thank you for taking time to visit with me today. Please don't hesitate to contact me if I can be of assistance to you before our next scheduled telephone appointment.  Our next appointment is by telephone on 10/26/23 at 1 PM  Following is a copy of your care plan:   Goals Addressed             This Visit's Progress    Transition Of Care       Transition Of Care  Current Barriers:  Medication access  Medication management  Provider appointments  Home Health services - [facility] PT [facility] Equipment/DME - Needs a walker Functional/Safety Functional Decline - Difficult to walk SDOH (other) No health insurance, Non-compliant with medication due to clost and lack of insurance Knowledge Deficits related to plan of care for management of Cellulitis  Financial Constraints  Non-adherence to prescribed medication regimen due to lack of medical coverage Difficulty obtaining medications - due to lack of financial resources and health insurance  RNCM Clinical Goal(s):  Patient will work with the Care Management team over the next 30 days to address Transition of Care Barriers: Medication access Medication Management Diet/Nutrition/Food Resources Provider appointments Home Health services by [facility] Home PT & OT Equipment/DME - Obtain a walker for low cost or from a lending closet  Functional/Safety - High risk for falls d/t the 2 occass 3+  Transportation verbalize basic understanding of  cellulitis disease process and self health management plan as evidenced by verbalize understanding on provided education. take all medications exactly as prescribed and will call provider for medication related questions as evidenced by review of EMR demonstrate understanding of rationale for each prescribed medication as evidenced by patient verbalize understanding and will be compliant in taking her medications. attend all scheduled medical appointments: PCP and Specialist as  evidenced by review of EMR and verbalization from the patient. demonstrate Improved adherence to prescribed treatment plan for CHF and DMII as evidenced by taking all medications as prescribed work with pharmacist to address Medication procurement related toCHF, DMII, and Cellulitis medication as evidenced by review or EMR and patient or pharmacist report work with Child psychotherapist to address financial constraints related to the management of/obtaining  medical Transportation during time she is unable to drive, Limited access to food due to Mom is no longer working and has children in the home, Medication procurement by working with Pharmacy to provide resources  that provide assistance obtaining her medication, and a walker, currently a high fall risk until swelling goes down,  and regains baseline functional mobility to aid in her ambulation.   Will need worker for her therapy sessions with [facility]. not experience re-hospital admission as evidenced by review of EMR. Hospital Admissions in 30 days through collaboration with RN Care manager, provider, and care team.   Interventions: Evaluation of current treatment plan related to  self management and patient's adherence to plan as established by provider Cellulitis. AMB Referral (stat) submitted for social worker to address SDOH: lack of financial resources as patient out of work due to illness, does not have health insurance and will need to apply for Medicaid, RPH to help with medication affordability so she can access her medicines as prescribed without having to skip doses.  Transitions of Care:  New goal. Durable Medical Equipment (DME) reviewed with patient/caregiver and options discussed with patient/caregiver.  Referred to SW for lending closets for dme. AMB ref for [employer] Referral Made to address transportation,  should she be unable to drive.  Food Insecurity - Assess for needs with younger children at home, mom/patient not working,  Nature conservation officer Visits  - discussed the importance keeping all of doctor visits Post discharge activity limitations prescribed by provider reviewed/discussed. Verbalized understanding RN provided verbal education regarding keeping legs elevated as much as she can to help get/keep the swelling down. Reviewed Signs and symptoms of infection    Patient Goals/Self-Care Activities: Participate in Transition of Care Program/Attend TOC scheduled calls Notify RN Care Manager of TOC call rescheduling needs Take all medications as prescribed Attend all scheduled provider appointments Call provider office for new concerns or questions  check blood sugar at prescribed times: before meals and at bedtime enter blood sugar readings and medication or insulin into daily log take the blood sugar log to all your doctor visits  Follow Up Plan:  Telephone follow up appointment with care management team member scheduled for:  10/26/23 for F/U with patient to see if she has heard from referral sources initiated today for SW, Registered Pharmacist and scheduled her appointment with Angus Seller, NP at [facility]. For post-hospital follow-up.         T Next, Telephone visit with RN Care Manager is scheduled for:  11/02/23 AT 10 AM  The patient has been provided with contact information for the care management team and has been advised to call with any health related questions or concerns that may arise after our call today.          Patient verbalizes understanding of instructions and care plan provided today and agrees to view in MyChart. Active MyChart status and patient understanding of how to access instructions and care plan via MyChart confirmed with patient.     Telephone follow up appointment with RN Care Manager is scheduled for: 10/26/23 at 1 PM and 11/02/23 at 10 AM. Ms. Dillon do not forget to call about setting up a follow up post-discharge appointment with Warner Mccreedy.  Please call the care  guide team at [phone] if you need to cancel or reschedule your appointment.   Please call the Suicide and Crisis Lifeline: 988 go to [facility] Urgent [facility] [address], [place] [phone]) if you are experiencing a Mental Health or Behavioral Health Crisis or need someone to talk to.  Dov Dill Daphine Deutscher BSN, RN RN Care Manager   Transitions of Care  [place] / [facility], [facility] Direct Dial Number: [phone]  Fax: [phone]         Visit Information  Thank you for taking time to visit with me today. Please don't hesitate to contact me if I can be of assistance to you before our next scheduled telephone appointment.  Our next appointment is by telephone on 2/7 at 1 pm  Following is a copy of your care plan:   Goals Addressed             This Visit's Progress    Transition Of Care       Transition Of Care  Current Barriers:  Medication access  Medication management  Provider appointments  Home Health services - [facility] PT [facility] OT Equipment/DME - Needs a walker Functional/Safety Functional Decline - Difficult to walk SDOH (other) No health insurance, Non-compliant with medication due to clost and lack of insurance Knowledge Deficits related to plan of care for management of Cellulitis  Financial Constraints  Non-adherence to prescribed medication regimen due to lack of  medical coverage Difficulty obtaining medications - due to lack of financial resources and health insurance  RNCM Clinical Goal(s):  Patient will work with the Care Management team over the next 30 days to address Transition of Care Barriers: Medication access Medication Management Diet/Nutrition/Food Resources Provider appointments Home Health services by [facility] for [facility] PT & OT Equipment/DME - Obtain a walker for low cost or from a lending closet  Functional/Safety - High risk for falls d/t the 2 occass 3+   Transportation verbalize basic understanding of  cellulitis disease process and self health management plan as evidenced by verbalize understanding on provided education. take all medications exactly as prescribed and will call provider for medication related questions as evidenced by review of EMR demonstrate understanding of rationale for each prescribed medication as evidenced by patient verbalize understanding and will be compliant in taking her medications. attend all scheduled medical appointments: PCP and Specialist as evidenced by review of EMR and verbalization from the patient. demonstrate Improved adherence to prescribed treatment plan for CHF and DMII as evidenced by taking all medications as prescribed work with pharmacist to address Medication procurement related toCHF, DMII, and Cellulitis medication as evidenced by review or EMR and patient or pharmacist report work with Child psychotherapist to address financial constraints related to the management of/obtaining  medical Transportation during time she is unable to drive, Limited access to food due to Mom is no longer working and has children in the home, Medication procurement by working with Pharmacy to provide resources  that provide assistance obtaining her medication, and a walker, currently a high fall risk until swelling goes down,  and regains baseline functional mobility to aid in her ambulation.   Will need worker for her therapy sessions with [facility]. not experience re-hospital admission as evidenced by review of EMR. Hospital Admissions in 30 days through collaboration with RN Care manager, provider, and care team.   Interventions: Evaluation of current treatment plan related to  self management and patient's adherence to plan as established by provider Cellulitis. AMB Referral (stat) submitted for social worker to address SDOH: lack of financial resources as patient out of work due to illness, does not have health insurance and  will need to apply for Medicaid, RPH to help with medication affordability so she can access her medicines as prescribed without having to skip doses.  Transitions of Care:  New goal. Durable Medical Equipment (DME) reviewed with patient/caregiver and options discussed with patient/caregiver.  Referred to SW for lending closets for dme. AMB ref for [employer] Referral Made to address transportation, should she be unable to drive.  Food Insecurity - Assess for needs with younger children at home, mom/patient not working, Nature conservation officer Visits  - discussed the importance keeping all of doctor visits Post discharge activity limitations prescribed by provider reviewed/discussed. Verbalized understanding RN provided verbal education regarding keeping legs elevated as much as she can to help get/keep the swelling down. Reviewed Signs and symptoms of infection    Patient Goals/Self-Care Activities: Participate in Transition of Care Program/Attend TOC scheduled calls Notify RN Care Manager of TOC call rescheduling needs Take all medications as prescribed Attend all scheduled provider appointments Call provider office for new concerns or questions  check blood sugar at prescribed times: before meals and at bedtime enter blood sugar readings and medication or insulin into daily log take the blood sugar log to all your doctor visits  Follow Up Plan:  Telephone follow up appointment with care management team member  scheduled for:  10/26/23 for F/U with patient to see if she has heard from referral sources initiated today for SW, Registered Pharmacist and scheduled her appointment with Angus Seller, NP at [facility]. For post-hospital follow-up.         T Next, Telephone visit with RN Care Manager is scheduled for:  11/02/23 AT 10 AM  The patient has been provided with contact information for the care management team and has been advised to call with any health related questions or concerns  that may arise after our call today.          Print copy of patient instructions, educational materials, and care plan provided in person.   Telephone follow up appointment with care management team member scheduled for:  Please call the care guide team at [phone] if you need to cancel or reschedule your appointment.   Please call the Suicide and Crisis Lifeline: 988 go to [facility] Urgent [facility] [address], [place] [phone]) call 911 if you are experiencing a Mental Health or Behavioral Health Crisis or need someone to talk to.  Wyline Mood BSN, Programmer, systems   Transitions of Care  [place] / [facility], [facility] Direct Dial Number: [phone]  Fax: [phone]

## 2023-10-23 ENCOUNTER — Telehealth: Payer: Self-pay | Admitting: *Deleted

## 2023-10-23 ENCOUNTER — Other Ambulatory Visit: Payer: Self-pay

## 2023-10-23 ENCOUNTER — Telehealth: Payer: Self-pay

## 2023-10-23 NOTE — Telephone Encounter (Signed)
 Copied from CRM [id number]. Topic: Referral - Question >> Oct 23, 2023 11:40 AM Farrel B wrote: Reason for CRM: Darice from [facility], needing to see if the provider would be willing to sign off on the referral physical therapy and occupational therapy from [facility]. Ms. Tyronne states the patient has not had an appointment in several months which is why she needed to ask or should she have the patient schedule an appointment.   [phone] [facility]

## 2023-10-23 NOTE — Progress Notes (Signed)
 Complex Care Management Note  Care Guide Note 10/23/2023 Name: Carolyn Zimmerman MRN: [id number] DOB: 1985/12/09  Carolyn Zimmerman is a 38 y.o. year old female who sees Oley Bascom RAMAN, NP for primary care. I reached out to [employer] by phone today to offer complex care management services.  [name] was given information about Complex Care Management services today including:   The Complex Care Management services include support from the care team which includes your Nurse Care Manager, Clinical Social Worker, or Pharmacist.  The Complex Care Management team is here to help remove barriers to the health concerns and goals most important to you. Complex Care Management services are voluntary, and the patient may decline or stop services at any time by request to their care team member.   Complex Care Management Consent Status: Patient agreed to services and verbal consent obtained.   Follow up plan:  Telephone appointment with complex care management team member scheduled for:  11/02/2023  Encounter Outcome:  Patient Scheduled  Thedford Franks, CMA, Care Guide [facility], [facility] Care Guide Direct Dial : [phone]  Fax: [phone] Website: [email or web address]

## 2023-10-26 ENCOUNTER — Telehealth: Payer: Self-pay

## 2023-10-26 ENCOUNTER — Other Ambulatory Visit: Payer: Self-pay

## 2023-10-26 NOTE — Patient Outreach (Deleted)
{  VBCI TOC PROGRAM NOTES:30402}

## 2023-10-26 NOTE — Progress Notes (Signed)
   10/26/2023  Patient ID: Carolyn Zimmerman, female   DOB: Oct 26, 1985, 38 y.o.   MRN: [id number]   Contacted patient regarding referral for medication access from Oley Bascom RAMAN, NP .   Left patient a voicemail to return my call at their convenience  Heather Factor, PharmD Clinical Pharmacist  [phone]

## 2023-10-26 NOTE — Patient Outreach (Signed)
 Care Management  Transitions of Care Program Transitions of Care Post-discharge week 2   10/26/2023 Name: Carolyn Zimmerman MRN: [id number] DOB: 10/04/1985  Subjective: Carolyn Zimmerman is a 38 y.o. year old female who is a primary care patient of Carolyn Bascom RAMAN, NP. The Care Management team Engaged with patient by telephone to assess and address transitions of care needs.   Consent to Services:  Patient was given information about care management services, agreed to services, and gave verbal consent to participate.   Assessment:   Patient is doing fair.  She still complains of swelling and pain in her right lower leg were she developed cellulitis.  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  She is considered about the cost of her and medications.  AMB referral to social worker initiated to assist with community and financial resources.  The patient was discharged home with an order for a walker to help decrease load off right leg but one was never sent.  HH Physical therapy is trying to see what we can find/do and I have reached out to our social worker to see if she is aware of any lending closets in the [place] area who provides used dme. Patient reports a temp of 99.6 yesterday but states her temperature is normal today.  She bought a thermometer today so she can monitor and call her doctor if temperature elevates.       SDOH Interventions    Flowsheet Row Telephone from 10/22/2023 in [facility] to Hosp-Admission (Discharged) from 10/11/2023 in [facility] Office Visit from 11/02/2022 in [name] Telephone from 04/28/2022 in [facility]  SDOH Interventions      Food Insecurity Interventions AMB Referral Inpatient TOC, [id number] Referral, Other (Comment)  [Resource added to AVS] -- Intervention Not Indicated  Housing  Interventions AMB Referral Inpatient TOC, Other (Comment)  [Resource added to AVS] -- Intervention Not Indicated  Transportation Interventions AMB Referral  [May need transportation to medical appointments until she is able to fully bear weight and walk with ad.] -- -- Intervention Not Indicated  Utilities Interventions AMB Referral  [Assess for needs due to recent loss of job] -- -- --  Depression Interventions/Treatment  -- -- EYV7-0 Score <4 Follow-up Not Indicated --  Financial Strain Interventions Artist, Other (Comment)  [AMB Ref] -- -- --  Stress Interventions -- -- -- [name], Patient Refused        Goals Addressed             This Visit's Progress    Transition Of Care       Transition Of Care  Current Barriers:  Medication access  Medication management  Provider appointments  Home Health services - [facility] PT [facility] OT Equipment/DME - Needs a walker Functional/Safety Functional Decline - Difficult to walk SDOH (other) No health insurance, Non-compliant with medication due to clost and lack of insurance Knowledge Deficits related to plan of care for management of Cellulitis  Financial Constraints  Non-adherence to prescribed medication regimen due to lack of medical coverage Difficulty obtaining medications - due to lack of financial resources and health insurance  RNCM Clinical Goal(s):  Patient will work with the Care Management team over the next 30 days to address Transition of Care Barriers: Medication access Medication Management Diet/Nutrition/Food Resources Provider appointments Home  Health services by [facility] for Home PT & OT [phone]) Equipment/DME - Obtain a walker for low cost or from a lending closet  Functional/Safety - High risk for falls d/t the 2 occass 3+  Transportation verbalize basic understanding of  cellulitis disease process and self health management plan as evidenced by verbalize  understanding on provided education. take all medications exactly as prescribed and will call provider for medication related questions as evidenced by review of EMR demonstrate understanding of rationale for each prescribed medication as evidenced by patient verbalize understanding and will be compliant in taking her medications. attend all scheduled medical appointments: PCP and Specialist as evidenced by review of EMR and verbalization from the patient. demonstrate Improved adherence to prescribed treatment plan for CHF and DMII as evidenced by taking all medications as prescribed work with pharmacist to address Medication procurement related toCHF, DMII, and Cellulitis medication as evidenced by review or EMR and patient or pharmacist report work with child psychotherapist to address financial constraints related to the management of/obtaining  medical Transportation during time she is unable to drive, Limited access to food due to Mom is no longer working and has children in the home, Medication procurement by working with Pharmacy to provide resources  that provide assistance obtaining her medication, and a walker, currently a high fall risk until swelling goes down,  and regains baseline functional mobility to aid in her ambulation.   Will need worker for her therapy sessions with [facility]. not experience re-hospital admission as evidenced by review of EMR. Hospital Admissions in 30 days through collaboration with RN Care manager, provider, and care team.   Interventions: Evaluation of current treatment plan related to  self management and patient's adherence to plan as established by provider Cellulitis. Confirmed with care guide that social worker has appointment set for 11/12/23 to assist with SDOH barriers.  Transitions of Care:  Gaol Not Met. Durable Medical Equipment (DME) reviewed with patient/caregiver and options discussed with patient/caregiver.  2/7 - Patient still in need of a walker.   Unable to afford.  HH PT saw patient 10/25/23 and said he would also see what he could locate for her.  Message to SW newly assigned to this patient to see if she has resources that I could share with the patient in hopes of finding a donated walker. Safety reviewed/discussed - patient is driving despite the pain and swelling in her leg.  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  Encouraged patient to stay off her leg as much as possible and elevate it whenever she is sitting.  HH PT started therapy services in the home on 10/25/23. Food Insecurity - Assess for needs with younger children at home, mom/patient not working, Corporate Treasurer ** SW referral pending for 11/02/23.  Patient states for now she is getting buy. Reviewed upcoming doctor appointments with first appt 2/10 with Roxan Plough, [facility] reemphasized post discharge activity limitations prescribed by your doctor.   She promises to try. Assessed patient was able to pick up her antibiotic, Doxycycline , and stressed to take it exactly as on the label Educated patient on s/sx to look for such as: extreme increase in swelling , become fully non ambulatory, chills, fever, increased redness and warmth to lower extremities and if swelling increased significant may have difficulty feeling pulses on the top of your feet or toes my be dusky and cool to the touch.   Should any of  these happen seek immediate medical attention.  Patient verb. Understanding.   Patient Goals/Self-Care Activities: Participate in Transition of Care Program/Attend [facility] scheduled calls Notify RN Care Manager of TOC call rescheduling needs Take all medications as prescribed Attend all scheduled provider appointments Call provider office for new concerns or questions  check blood sugar at prescribed times: before meals and at bedtime enter blood sugar readings on daily log take the log with blood sugar readings to  your doctor visits Keep legs elevated to help decrease the swelling  Follow Up Plan:  Telephone follow up appointment with care management team member scheduled for:  11/02/23       The patient has been provided with contact information for the care management team and has been advised to call with any health related questions or concerns that may arise after our call today.          Plan: Telephone follow up appointment with care management team member scheduled for: 11/02/23 The patient has been provided with contact information for the care management team and has been advised to call with any health related questions or concerns.   Pasco Lunger BSN, Programmer, Systems   Transitions of Care  [place] / [facility], [facility] Direct Dial  Number: [phone]  Fax: [phone]

## 2023-10-29 ENCOUNTER — Ambulatory Visit (INDEPENDENT_AMBULATORY_CARE_PROVIDER_SITE_OTHER): Payer: Medicaid Other | Admitting: Family

## 2023-10-29 ENCOUNTER — Encounter (HOSPITAL_BASED_OUTPATIENT_CLINIC_OR_DEPARTMENT_OTHER): Payer: Self-pay | Admitting: Emergency Medicine

## 2023-10-29 ENCOUNTER — Other Ambulatory Visit: Payer: Self-pay

## 2023-10-29 ENCOUNTER — Emergency Department (HOSPITAL_BASED_OUTPATIENT_CLINIC_OR_DEPARTMENT_OTHER): Payer: Self-pay

## 2023-10-29 ENCOUNTER — Inpatient Hospital Stay (HOSPITAL_BASED_OUTPATIENT_CLINIC_OR_DEPARTMENT_OTHER)
Admission: EM | Admit: 2023-10-29 | Discharge: 2023-11-02 | DRG: 603 | Disposition: A | Discharge status: Home / Self Care | Payer: Medicaid Other | Attending: Internal Medicine | Admitting: Internal Medicine

## 2023-10-29 VITALS — BP 160/110 | Temp 97.5°F | Resp 17 | Ht 67.0 in | Wt 278.0 lb

## 2023-10-29 DIAGNOSIS — I5042 Chronic combined systolic (congestive) and diastolic (congestive) heart failure: Secondary | ICD-10-CM | POA: Diagnosis present

## 2023-10-29 DIAGNOSIS — Z6841 Body Mass Index (BMI) 40.0 and over, adult: Secondary | ICD-10-CM

## 2023-10-29 DIAGNOSIS — M79661 Pain in right lower leg: Secondary | ICD-10-CM

## 2023-10-29 DIAGNOSIS — Z833 Family history of diabetes mellitus: Secondary | ICD-10-CM

## 2023-10-29 DIAGNOSIS — Z79899 Other long term (current) drug therapy: Secondary | ICD-10-CM

## 2023-10-29 DIAGNOSIS — Z794 Long term (current) use of insulin: Secondary | ICD-10-CM

## 2023-10-29 DIAGNOSIS — E1165 Type 2 diabetes mellitus with hyperglycemia: Secondary | ICD-10-CM

## 2023-10-29 DIAGNOSIS — R739 Hyperglycemia, unspecified: Secondary | ICD-10-CM

## 2023-10-29 DIAGNOSIS — I5022 Chronic systolic (congestive) heart failure: Secondary | ICD-10-CM | POA: Diagnosis not present

## 2023-10-29 DIAGNOSIS — Z7982 Long term (current) use of aspirin: Secondary | ICD-10-CM

## 2023-10-29 DIAGNOSIS — Z8673 Personal history of transient ischemic attack (TIA), and cerebral infarction without residual deficits: Secondary | ICD-10-CM | POA: Diagnosis not present

## 2023-10-29 DIAGNOSIS — I10 Essential (primary) hypertension: Secondary | ICD-10-CM

## 2023-10-29 DIAGNOSIS — R0989 Other specified symptoms and signs involving the circulatory and respiratory systems: Secondary | ICD-10-CM

## 2023-10-29 DIAGNOSIS — F419 Anxiety disorder, unspecified: Secondary | ICD-10-CM | POA: Diagnosis present

## 2023-10-29 DIAGNOSIS — I110 Hypertensive heart disease with heart failure: Secondary | ICD-10-CM | POA: Diagnosis present

## 2023-10-29 DIAGNOSIS — Z7984 Long term (current) use of oral hypoglycemic drugs: Secondary | ICD-10-CM

## 2023-10-29 DIAGNOSIS — Z7689 Persons encountering health services in other specified circumstances: Secondary | ICD-10-CM

## 2023-10-29 DIAGNOSIS — L03115 Cellulitis of right lower limb: Principal | ICD-10-CM | POA: Diagnosis present

## 2023-10-29 DIAGNOSIS — Z720 Tobacco use: Secondary | ICD-10-CM | POA: Diagnosis present

## 2023-10-29 DIAGNOSIS — F32A Depression, unspecified: Secondary | ICD-10-CM | POA: Diagnosis present

## 2023-10-29 DIAGNOSIS — Z91148 Patient's other noncompliance with medication regimen for other reason: Secondary | ICD-10-CM

## 2023-10-29 DIAGNOSIS — E782 Mixed hyperlipidemia: Secondary | ICD-10-CM | POA: Diagnosis present

## 2023-10-29 DIAGNOSIS — Z2821 Immunization not carried out because of patient refusal: Secondary | ICD-10-CM

## 2023-10-29 DIAGNOSIS — D638 Anemia in other chronic diseases classified elsewhere: Secondary | ICD-10-CM | POA: Diagnosis present

## 2023-10-29 DIAGNOSIS — L0390 Cellulitis, unspecified: Secondary | ICD-10-CM | POA: Diagnosis present

## 2023-10-29 DIAGNOSIS — Z87891 Personal history of nicotine dependence: Secondary | ICD-10-CM

## 2023-10-29 DIAGNOSIS — D509 Iron deficiency anemia, unspecified: Secondary | ICD-10-CM

## 2023-10-29 DIAGNOSIS — M7989 Other specified soft tissue disorders: Secondary | ICD-10-CM

## 2023-10-29 DIAGNOSIS — D75839 Thrombocytosis, unspecified: Secondary | ICD-10-CM

## 2023-10-29 DIAGNOSIS — F17210 Nicotine dependence, cigarettes, uncomplicated: Secondary | ICD-10-CM | POA: Diagnosis present

## 2023-10-29 DIAGNOSIS — Z8249 Family history of ischemic heart disease and other diseases of the circulatory system: Secondary | ICD-10-CM

## 2023-10-29 DIAGNOSIS — E0849 Diabetes mellitus due to underlying condition with other diabetic neurological complication: Secondary | ICD-10-CM | POA: Insufficient documentation

## 2023-10-29 DIAGNOSIS — E441 Mild protein-calorie malnutrition: Secondary | ICD-10-CM | POA: Diagnosis present

## 2023-10-29 DIAGNOSIS — E66813 Obesity, class 3: Secondary | ICD-10-CM | POA: Diagnosis present

## 2023-10-29 DIAGNOSIS — Z8739 Personal history of other diseases of the musculoskeletal system and connective tissue: Secondary | ICD-10-CM

## 2023-10-29 MED ORDER — TORSEMIDE 20 MG PO TABS
40.0000 mg | ORAL_TABLET | Freq: Every day | ORAL | Status: DC
  Administered 2023-10-31 – 2023-11-02 (×3): 40 mg via ORAL
  Filled 2023-10-29 (×3): qty 2

## 2023-10-29 MED ORDER — DICLOFENAC SODIUM 75 MG PO TBEC
75.0000 mg | DELAYED_RELEASE_TABLET | Freq: Two times a day (BID) | ORAL | 0 refills | Status: DC
  Filled 2023-10-29: qty 30, 15d supply, fill #0

## 2023-10-29 MED ORDER — NICOTINE 14 MG/24HR TD PT24
14.0000 mg | MEDICATED_PATCH | Freq: Every day | TRANSDERMAL | 2 refills | Status: DC
  Filled 2023-10-29: qty 28, 28d supply, fill #0

## 2023-10-29 MED ORDER — SPIRONOLACTONE 25 MG PO TABS
50.0000 mg | ORAL_TABLET | Freq: Every day | ORAL | Status: DC
  Administered 2023-10-30 – 2023-11-02 (×4): 50 mg via ORAL
  Filled 2023-10-29 (×2): qty 2
  Filled 2023-10-29: qty 4
  Filled 2023-10-29: qty 2

## 2023-10-29 MED ORDER — SPIRONOLACTONE 50 MG PO TABS
50.0000 mg | ORAL_TABLET | Freq: Every day | ORAL | 1 refills | Status: DC
Start: 2023-10-29 — End: 2023-11-02
  Filled 2023-10-29: qty 90, 90d supply, fill #0

## 2023-10-29 MED ORDER — HYDROCHLOROTHIAZIDE 25 MG PO TABS
25.0000 mg | ORAL_TABLET | Freq: Once | ORAL | Status: AC
Start: 2023-10-29 — End: 2023-10-29
  Administered 2023-10-29: 25 mg via ORAL
  Filled 2023-10-29: qty 1

## 2023-10-29 MED ORDER — INSULIN GLARGINE SOLOSTAR 100 UNIT/ML ~~LOC~~ SOPN
25.0000 [IU] | PEN_INJECTOR | Freq: Every day | SUBCUTANEOUS | 1 refills | Status: DC
Start: 2023-10-29 — End: 2023-11-02
  Filled 2023-10-29: qty 3, 12d supply, fill #0

## 2023-10-29 MED ORDER — ALBUTEROL SULFATE HFA 108 (90 BASE) MCG/ACT IN AERS
2.0000 | INHALATION_SPRAY | RESPIRATORY_TRACT | 11 refills | Status: DC | PRN
  Filled 2023-10-29: qty 18, fill #0

## 2023-10-29 MED ORDER — VALSARTAN 320 MG PO TABS
320.0000 mg | ORAL_TABLET | Freq: Every day | ORAL | 1 refills | Status: DC
  Filled 2023-10-29: qty 90, 90d supply, fill #0

## 2023-10-29 MED ORDER — HYDROCHLOROTHIAZIDE 12.5 MG PO CAPS: 25.0000 mg | ORAL_CAPSULE | Freq: Every day | ORAL | Status: DC

## 2023-10-29 MED ORDER — IRBESARTAN 150 MG PO TABS
300.0000 mg | ORAL_TABLET | Freq: Once | ORAL | Status: AC
  Administered 2023-10-29: 300 mg via ORAL
  Filled 2023-10-29: qty 2

## 2023-10-29 MED ORDER — CARVEDILOL 25 MG PO TABS
25.0000 mg | ORAL_TABLET | Freq: Two times a day (BID) | ORAL | 1 refills | Status: DC
  Filled 2023-10-29: qty 180, 90d supply, fill #0

## 2023-10-29 MED ORDER — TORSEMIDE 20 MG PO TABS
40.0000 mg | ORAL_TABLET | Freq: Every day | ORAL | 1 refills | Status: DC
  Filled 2023-10-29: qty 90, 45d supply, fill #0

## 2023-10-29 MED ORDER — CARVEDILOL 25 MG PO TABS
25.0000 mg | ORAL_TABLET | Freq: Two times a day (BID) | ORAL | Status: DC
  Administered 2023-10-30 – 2023-11-02 (×7): 25 mg via ORAL
  Filled 2023-10-29 (×4): qty 1
  Filled 2023-10-29: qty 2
  Filled 2023-10-29 (×2): qty 1

## 2023-10-29 MED ORDER — AMLODIPINE BESYLATE 10 MG PO TABS
10.0000 mg | ORAL_TABLET | Freq: Every day | ORAL | 3 refills | Status: DC
  Filled 2023-10-29: qty 90, 90d supply, fill #0

## 2023-10-29 MED ORDER — METFORMIN HCL ER 500 MG PO TB24
1000.0000 mg | ORAL_TABLET | Freq: Two times a day (BID) | ORAL | 5 refills | Status: DC
  Filled 2023-10-29: qty 120, 30d supply, fill #0

## 2023-10-29 MED ORDER — SODIUM CHLORIDE 0.9 % IV SOLN
2.0000 g | INTRAVENOUS | Status: DC
  Administered 2023-10-30 – 2023-11-01 (×4): 2 g via INTRAVENOUS
  Filled 2023-10-29 (×4): qty 20

## 2023-10-29 MED ORDER — INSULIN LISPRO (1 UNIT DIAL) 100 UNIT/ML (KWIKPEN)
10.0000 [IU] | PEN_INJECTOR | Freq: Three times a day (TID) | SUBCUTANEOUS | 1 refills | Status: DC
  Filled 2023-10-29: qty 3, 10d supply, fill #0

## 2023-10-29 MED ORDER — ASPIRIN 81 MG PO CHEW
81.0000 mg | CHEWABLE_TABLET | Freq: Every day | ORAL | 1 refills | Status: DC
  Filled 2023-10-29 – 2023-12-05 (×2): qty 90, 90d supply, fill #0

## 2023-10-29 MED ORDER — DOXYCYCLINE HYCLATE 100 MG PO TABS
100.0000 mg | ORAL_TABLET | Freq: Two times a day (BID) | ORAL | 0 refills | Status: DC
  Filled 2023-10-29: qty 14, 7d supply, fill #0

## 2023-10-29 MED ORDER — ROSUVASTATIN CALCIUM 20 MG PO TABS
20.0000 mg | ORAL_TABLET | Freq: Every day | ORAL | 1 refills | Status: DC
  Filled 2023-10-29: qty 90, 90d supply, fill #0

## 2023-10-29 NOTE — ED Triage Notes (Signed)
 Right leg pain. Redness and swelling, tender to touch Noticed today Fever 101  D/c from hospital feb 3rd

## 2023-10-29 NOTE — ED Provider Notes (Addendum)
[place] EMERGENCY DEPARTMENT AT [facility] Provider Note   CSN: [id number] Arrival date & time: 10/29/23  2056     History  Chief Complaint  Patient presents with   Leg Pain    Carolyn Zimmerman is a 38 y.o. female.  The history is provided by the patient.  Leg Pain She has history of hypertension, diabetes, hyperlipidemia, heart failure with reduced ejection fraction and recently hospitalized for cellulitis of the right lower extremity and comes in because of ongoing pain and swelling of her right leg.  She had been discharged 8 days ago.  She did have a fever to 101.6 this morning but denies chills or sweats.  She saw her primary care provider today who ordered additional antibiotics.  Patient was concerned because she had been septic with her recent hospitalization and wanted to make sure she was not septic again.  She states that her leg swelling and pain have not changed since discharge.  Also, patient states that she had run out of all of her other medications 4 days ago, including insulin.  She claims compliance with her antibiotic.    Home Medications Prior to Admission medications   Medication Sig Start Date End Date Taking? Authorizing Provider  albuterol (VENTOLIN HFA) 108 (90 Base) MCG/ACT inhaler Inhale 2 puffs into the lungs every 4 (four) hours as needed for wheezing or shortness of breath. 10/29/23   Ngetich, [name], NP  ALEVE 220 MG tablet Take 220-440 mg by mouth 2 (two) times daily as needed (for headaches or mild pain).    [provider]  amLODipine (NORVASC) 10 MG tablet Take 1 tablet (10 mg total) by mouth daily. 10/29/23   Ngetich, [name], NP  aspirin 81 MG chewable tablet Chew 1 tablet (81 mg total) by mouth daily. 10/29/23   Ngetich, Donalee Citrin, NP  blood glucose meter kit and supplies Dispense based on patient and insurance preference. Use up to four times daily as directed. (FOR ICD-9 250.00, 250.01). 06/02/20   Kallie Locks, FNP  carvedilol  (COREG) 25 MG tablet Take 1 tablet (25 mg total) by mouth 2 (two) times daily. 10/29/23   Ngetich, [name], NP  Continuous Glucose Sensor (DEXCOM G7 SENSOR) MISC use to monitor blood glucose 03/29/23   Motwani, Carin Hock, MD  diclofenac (VOLTAREN) 75 MG EC tablet Take 1 tablet (75 mg total) by mouth 2 (two) times daily. 10/29/23   Ngetich, [name], NP  doxycycline (VIBRA-TABS) 100 MG tablet Take 1 tablet (100 mg total) by mouth 2 (two) times daily for 7 days. 10/29/23 11/05/23  Ngetich, [name], NP  hydrochlorothiazide (MICROZIDE) 12.5 MG capsule Take 2 capsules (25 mg total) by mouth daily. 10/29/23   Ngetich, [name], NP  Insulin Glargine Solostar (LANTUS) 100 UNIT/ML Solostar Pen Inject 25 Units into the skin daily. 10/29/23   Ngetich, [name], NP  insulin lispro (HUMALOG) 100 UNIT/ML KwikPen Inject 10 Units into the skin 3 (three) times daily. 10/29/23   Ngetich, [name], NP  metFORMIN (GLUCOPHAGE-XR) 500 MG 24 hr tablet Take 2 tablets (1,000 mg total) by mouth in the morning and at bedtime. 10/29/23   Ngetich, [name], NP  nicotine (NICODERM CQ - DOSED IN MG/24 HOURS) 14 mg/24hr patch Place 1 patch (14 mg total) onto the skin daily. 10/29/23   Ngetich, [name], NP  rosuvastatin (CRESTOR) 20 MG tablet Take 1 tablet (20 mg total) by mouth daily. 10/29/23   Ngetich, Donalee Citrin, NP  spironolactone (  ALDACTONE) 50 MG tablet Take 1 tablet (50 mg total) by mouth daily. 10/29/23   Ngetich, [name], NP  torsemide (DEMADEX) 20 MG tablet Take 2 tablets (40 mg total) by mouth daily. 10/29/23 01/27/24  Ngetich, [name], NP  valsartan (DIOVAN) 320 MG tablet Take 1 tablet (320 mg total) by mouth daily. 10/29/23   Ngetich, [name], NP      Allergies    Contrast media [iodinated contrast media]    Review of Systems   Review of Systems  All other systems reviewed and are negative.   Physical Exam Updated Vital Signs BP (!) 197/112   Pulse 89   Temp 98.3 F (36.8 C)   Resp 20   LMP 10/04/2023   SpO2 100%  Physical  Exam Vitals and nursing note reviewed.   38 year old female, resting comfortably and in no acute distress. Vital signs are significant for elevated blood pressure. Oxygen saturation is 100%, which is normal. Head is normocephalic and atraumatic. PERRLA, EOMI. Oropharynx is clear. Neck is nontender and supple without adenopathy or JVD. Back is nontender and there is no CVA tenderness. Lungs are clear without rales, wheezes, or rhonchi. Chest is nontender. Heart has regular rate and rhythm without murmur. Abdomen is soft, flat, nontender. Extremities: Right calf is swollen, mildly erythematous, warm to touch, and markedly tender.  There is some induration over the posterior calf.  Calf circumference is 3 cm greater on the right than the left.  Left leg does have 1+ pitting edema. Skin is warm and dry without rash. Neurologic: Mental status is normal, cranial nerves are intact, moves all extremities equally.  ED Results / Procedures / Treatments   Labs (all labs ordered are listed, but only abnormal results are displayed) Labs Reviewed  COMPREHENSIVE METABOLIC PANEL - Abnormal; Notable for the following components:      Result Value   Sodium 126 (*)    Chloride 92 (*)    Glucose, Bld 544 (*)    Calcium 8.7 (*)    Albumin 3.3 (*)    AST 11 (*)    All other components within normal limits  CBC WITH DIFFERENTIAL/PLATELET - Abnormal; Notable for the following components:   Hemoglobin 10.9 (*)    HCT 34.2 (*)    MCV 79.7 (*)    MCH 25.4 (*)    Platelets 412 (*)    All other components within normal limits  CULTURE, BLOOD (ROUTINE X 2)  CULTURE, BLOOD (ROUTINE X 2)  LACTIC ACID, PLASMA  PROTIME-INR  APTT  LACTIC ACID, PLASMA  PREGNANCY, URINE  URINALYSIS, W/ REFLEX TO CULTURE (INFECTION SUSPECTED)    EKG EKG Interpretation Date/Time:  Monday October 29 2023 23:41:24 EST Ventricular Rate:  79 PR Interval:  186 QRS Duration:  115 QT Interval:  430 QTC Calculation: 493 R  Axis:   36  Text Interpretation: duplicate, discard Confirmed by Dione Booze ([id number]) on 10/29/2023 11:56:46 PM  Radiology DG Chest Port 1 View Result Date: 10/30/2023 CLINICAL DATA:  Right leg pain, redness and swelling. EXAM: PORTABLE CHEST 1 VIEW COMPARISON:  October 11, 2023 FINDINGS: There is stable mild to moderate severity cardiac silhouette enlargement. Mild, stable lingular linear scarring and/or atelectasis is seen. There is no evidence of an acute infiltrate, pleural effusion or pneumothorax. The visualized skeletal structures are unremarkable. IMPRESSION: Stable cardiomegaly with mild, stable lingular linear scarring and/or atelectasis. Electronically Signed   By: Aram Candela M.D.   On: 10/30/2023 00:36  Procedures Procedures    Medications Ordered in ED Medications - No data to display  ED Course/ Medical Decision Making/ A&P                                 Medical Decision Making Amount and/or Complexity of Data Reviewed Labs: ordered. Radiology: ordered.  Risk OTC drugs. Prescription drug management. Decision regarding hospitalization.   Cellulitis of the right lower leg with failure to respond to doxycycline.  She will need to be readmitted as a treatment failure.  Markedly elevated blood pressure which is at least partially secondary to medication noncompliance.  I have ordered sepsis labs and I have also ordered initial doses of all of her antihypertensive medications.  I am empirically starting her on ceftriaxone.  I have reviewed her laboratory tests, and my interpretation is normal lactic acid level, anemia with hyponatremia appropriate to degree of hyperglycemia, normal WBC, stable anemia, normal coagulation studies.  Because of hyperglycemia, I have ordered a dose of intravenous insulin.  There is no evidence of ketoacidosis.  Chest x-ray shows stable cardiomegaly without pneumonia.  Have independently viewed the image, and agree with the radiologist's  interpretation.  I have discussed case with Dr. Antionette Char of [facility], who agrees to admit the patient.  CRITICAL CARE Performed by: Dione Booze Total critical care time: 40 minutes Critical care time was exclusive of separately billable procedures and treating other patients. Critical care was necessary to treat or prevent imminent or life-threatening deterioration. Critical care was time spent personally by me on the following activities: development of treatment plan with patient and/or surrogate as well as nursing, discussions with consultants, evaluation of patient's response to treatment, examination of patient, obtaining history from patient or surrogate, ordering and performing treatments and interventions, ordering and review of laboratory studies, ordering and review of radiographic studies, pulse oximetry and re-evaluation of patient's condition.  Final Clinical Impression(s) / ED Diagnoses Final diagnoses:  Cellulitis of right lower leg  Hyperglycemia  Elevated blood pressure reading with diagnosis of hypertension  Noncompliance with medication regimen  Microcytic anemia  Thrombocytosis    Rx / DC Orders ED Discharge Orders     None         Dione Booze, MD 10/30/23 0105  Patient now states that she wants to leave AGAINST MEDICAL ADVICE.  I have talked with her about risk of uncontrolled infection, including the risk of infection progressing to the point where amputation is necessary.  Patient insists on leaving.  Since she failed doxycycline, I am discharging her with a prescription for cefadroxil.  I have informed her that she is welcome to return at any time had to be admitted.  If she chooses not to return, I encouraged her to follow-up with her primary care provider.   Dione Booze, MD 10/30/23 [phone]   Patient is now agreeing to stay.  Her main concern was pain was not being addressed.  I have ordered a dose of hydrocodone-acetaminophen, and I have ordered  naproxen and acetaminophen for ongoing pain relief.   Dione Booze, MD 10/30/23 [phone]

## 2023-10-29 NOTE — Progress Notes (Signed)
   10/29/2023  Patient ID: Carolyn Zimmerman, female   DOB: Nov 21, 1985, 38 y.o.   MRN: [id number]   Contacted patient regarding referral for medication access from Ngetich, [name], NP .   Left patient a voicemail to return my call at their convenience  Livia Riffle, PharmD Clinical Pharmacist  [phone]

## 2023-10-29 NOTE — Progress Notes (Signed)
 Provider: Christean Courts FNP-C   Kenrick Pore, Elijio Guadeloupe, NP  Patient Care Team: Hrithik Boschee, Elijio Guadeloupe, NP as PCP - General (Family Medicine) Tobb, Kardie, DO as PCP - Cardiology (Cardiology) Candyce Champagne, MD as Consulting Physician (General Surgery) Verlyn Goad, MD as Consulting Physician (Obstetrics and Gynecology) Jannelle Memory, RPH-CPP (Pharmacist) Randye Buttner, RN as [facility] Care Management  Extended Emergency Contact Information Primary Emergency Contact: Sprinkle,Sonya  [place] Mobile Phone: [phone] Relation: Mother Secondary Emergency Contact: rhodes,shalaka Mobile Phone: [phone] Relation: Sister  Code Status:  Full Code  Goals of care: Advanced Directive information    10/29/2023   10:54 AM  Advanced Directives  Does Patient Have a Medical Advance Directive? No  Would patient like information on creating a medical advance directive? No - Patient declined     Chief Complaint  Patient presents with   Establish Care    New patient.   Discussed the use of AI scribe software for clinical note transcription with the patient, who gave verbal consent to proceed.  History of Present Illness   Carolyn Zimmerman is a 38 year old female with asthma, hypertension, heart failure, and diabetes who presents to establish care, with medication refill and follow-up for cellulitis of the right lower extremity. Has not seen PCP since 2023.  She presents for follow-up of cellulitis of the right lower extremity. Her right leg is swollen and painful, particularly when bearing weight. She was hospitalized on October 11, 2023, for cellulitis and underwent an ultrasound and CT scan, which showed subcutaneous edema and enlarged lymph nodes in the right groin area, but no abscess or blood clots. She completed a course of doxycycline , taking the last dose this morning. No tenderness in the groin area, but she reports numbness and tingling in the right leg for the last  couple of weeks since the infection. Continues to have pain on right calf muscle and entire leg tender to touch. She denies any redness or increased warmth.  She requires a refill of her medications for hypertension, heart failure, and diabetes. She has run out of amlodipine , carvedilol , hydrochlorothiazide , spironolactone , and valsartan . Her blood pressure was noted to be very high at 170/120 mmHg, but she denies symptoms such as headache or chest pain. She also uses Lantus , Humalog , and metformin  for diabetes management but reports her blood sugars have been 'really high,' around 200 mg/dL. She uses a Dexcom for monitoring.  She has a history of asthma, for which she uses albuterol . She experienced a stroke in 2023, which resulted in weakness, headache, blurry vision, and inability to move her arms. She reports ongoing weakness in her right leg.  No current smoking, but she uses nicotine  patches occasionally. She has not seen an eye doctor recently due to waiting for Medicaid coverage.    Past Medical History:  Diagnosis Date   Abscess of chest wall 06/15/2017   Coronavirus infection 05/02/2020   CVA (cerebral vascular accident) (HCC) 07/14/2022   L MCA CVA, watershed infarct   Diabetes mellitus    Heart failure with mildly reduced ejection fraction (HFmrEF) (HCC) 09/29/2021   EF 45%, moderate LVH   Hyperlipidemia associated with type 2 diabetes mellitus (HCC)    Hypertension    Jaundice    Obesity    TOA (tubo-ovarian abscess) 09/2019   Past Surgical History:  Procedure Laterality Date   CHOLECYSTECTOMY     IRRIGATION AND DEBRIDEMENT ABSCESS N/A 05/02/2020   Procedure: IRRIGATION AND DEBRIDEMENT ABDOMINAL and CHEST WALL NECROTIZING FASCITITS;  Surgeon: Candyce Champagne, MD;  Location: [facility];  Service: General;  Laterality: N/A;   liver stent      Allergies  Allergen Reactions   Contrast Media [Iodinated Contrast Media] Hives and Swelling    Allergies as of 10/29/2023        Reactions   Contrast Media [iodinated Contrast Media] Hives, Swelling        Medication List        Accurate as of October 29, 2023 11:12 AM. If you have any questions, ask your nurse or doctor.          STOP taking these medications    acetaminophen  500 MG tablet Commonly known as: TYLENOL  Stopped by: Elaf Clauson C Toddy Boyd   doxycycline  100 MG tablet Commonly known as: VIBRA -TABS Stopped by: Nicolasa Milbrath C Judeen Geralds       TAKE these medications    albuterol  108 (90 Base) MCG/ACT inhaler Commonly known as: VENTOLIN  HFA Inhale 2 puffs into the lungs every 4 (four) hours as needed for wheezing or shortness of breath.   Aleve  220 MG tablet Generic drug: naproxen  sodium Take 220-440 mg by mouth 2 (two) times daily as needed (for headaches or mild pain).   amLODipine  10 MG tablet Commonly known as: NORVASC  Take 1 tablet (10 mg total) by mouth daily.   aspirin  81 MG chewable tablet Chew 1 tablet (81 mg total) by mouth daily.   blood glucose meter kit and supplies Dispense based on patient and insurance preference. Use up to four times daily as directed. (FOR ICD-9 250.00, 250.01).   carvedilol  25 MG tablet Commonly known as: COREG  Take 1 tablet (25 mg total) by mouth 2 (two) times daily.   Dexcom G7 Sensor Misc use to monitor blood glucose   hydrochlorothiazide  12.5 MG capsule Commonly known as: MICROZIDE  Take 2 capsules (25 mg total) by mouth daily.   Insulin  Glargine Solostar 100 UNIT/ML Solostar Pen Commonly known as: LANTUS  Inject 25 Units into the skin daily.   insulin  lispro 100 UNIT/ML KwikPen Commonly known as: HUMALOG  Inject 10 Units into the skin 3 (three) times daily.   metFORMIN  500 MG 24 hr tablet Commonly known as: GLUCOPHAGE -XR Take 2 tablets (1,000 mg total) by mouth in the morning and at bedtime.   nicotine  14 mg/24hr patch Commonly known as: NICODERM CQ  - dosed in mg/24 hours Place 1 patch (14 mg total) onto the skin daily.   rosuvastatin  20  MG tablet Commonly known as: CRESTOR  Take 1 tablet (20 mg total) by mouth daily.   spironolactone  50 MG tablet Commonly known as: ALDACTONE  Take 1 tablet (50 mg total) by mouth daily.   torsemide  20 MG tablet Commonly known as: DEMADEX  Take 2 tablets (40 mg total) by mouth daily.   valsartan  320 MG tablet Commonly known as: DIOVAN  Take 1 tablet (320 mg total) by mouth daily.        Review of Systems  Constitutional:  Negative for appetite change, chills, fatigue, fever and unexpected weight change.  HENT:  Negative for congestion, dental problem, ear discharge, ear pain, facial swelling, hearing loss, nosebleeds, postnasal drip, rhinorrhea, sinus pressure, sinus pain, sneezing, sore throat, tinnitus and trouble swallowing.   Eyes:  Negative for pain, discharge, redness, itching and visual disturbance.  Respiratory:  Negative for cough, chest tightness, shortness of breath and wheezing.   Cardiovascular:  Positive for leg swelling. Negative for chest pain and palpitations.  Gastrointestinal:  Negative for abdominal distention, abdominal pain, blood in stool, constipation, diarrhea, nausea  and vomiting.  Endocrine: Negative for cold intolerance, heat intolerance, polydipsia, polyphagia and polyuria.  Genitourinary:  Negative for difficulty urinating, dysuria, flank pain, frequency and urgency.  Musculoskeletal:  Positive for arthralgias. Negative for back pain, gait problem, joint swelling, myalgias, neck pain and neck stiffness.       Right leg pain and swelling   Skin:  Negative for color change, pallor, rash and wound.  Neurological:  Negative for dizziness, syncope, speech difficulty, weakness, light-headedness, numbness and headaches.  Hematological:  Does not bruise/bleed easily.  Psychiatric/Behavioral:  Negative for agitation, behavioral problems, confusion, hallucinations, self-injury, sleep disturbance and suicidal ideas. The patient is not nervous/anxious.      Immunization History  Administered Date(s) Administered   Influenza, Seasonal, Injecte, Preservative Fre 10/13/2023   Influenza,inj,Quad PF,6+ Mos 06/16/2017   PNEUMOCOCCAL CONJUGATE-20 10/13/2023   Pneumococcal Polysaccharide-23 06/17/2017   Pertinent  Health Maintenance Due  Topic Date Due   OPHTHALMOLOGY EXAM  Never done   FOOT EXAM  03/28/2024   HEMOGLOBIN A1C  04/10/2024   INFLUENZA VACCINE  Completed      07/09/2022    9:20 PM 07/11/2022    6:40 PM 11/30/2022    2:25 PM 03/29/2023   11:23 AM 10/29/2023   10:54 AM  Fall Risk  Falls in the past year?   0 0 0  Was there an injury with Fall?   0 0 0  Fall Risk Category Calculator   0 0 0  (RETIRED) Patient Fall Risk Level Low fall risk Low fall risk     Patient at Risk for Falls Due to    No Fall Risks No Fall Risks  Fall risk Follow up   Falls evaluation completed Falls evaluation completed Falls evaluation completed;Education provided;Falls prevention discussed   Functional Status Survey:    Vitals:   10/29/23 1041  BP: (!) 170/120  Resp: 17  Temp: (!) 97.5 F (36.4 C)  Weight: 278 lb (126.1 kg)  Height: 5\' 7"  (1.702 m)   Body mass index is 43.54 kg/m. Physical Exam Vitals reviewed.  Constitutional:      General: She is not in acute distress.    Appearance: Normal appearance. She is obese. She is not ill-appearing or diaphoretic.  HENT:     Head: Normocephalic.     Right Ear: Tympanic membrane, ear canal and external ear normal. There is no impacted cerumen.     Left Ear: Tympanic membrane, ear canal and external ear normal. There is no impacted cerumen.     Nose: Nose normal. No congestion or rhinorrhea.     Mouth/Throat:     Mouth: Mucous membranes are moist.     Pharynx: Oropharynx is clear. No oropharyngeal exudate or posterior oropharyngeal erythema.  Eyes:     General: No scleral icterus.       Right eye: No discharge.        Left eye: No discharge.     Extraocular Movements: Extraocular  movements intact.     Conjunctiva/sclera: Conjunctivae normal.     Pupils: Pupils are equal, round, and reactive to light.  Neck:     Vascular: No carotid bruit.  Cardiovascular:     Rate and Rhythm: Normal rate and regular rhythm.     Heart sounds: Normal heart sounds. No murmur heard.    No friction rub. No gallop.     Comments: Right decreased pedal pulse  Calf muscle tender to palpation  Pulmonary:     Effort: Pulmonary effort is normal.  No respiratory distress.     Breath sounds: Normal breath sounds. No wheezing, rhonchi or rales.  Chest:     Chest wall: No tenderness.  Abdominal:     General: Bowel sounds are normal. There is no distension.     Palpations: Abdomen is soft. There is no mass.     Tenderness: There is no abdominal tenderness. There is no right CVA tenderness, left CVA tenderness, guarding or rebound.  Musculoskeletal:        General: Swelling present. Normal range of motion.     Cervical back: Normal range of motion. No rigidity or tenderness.     Right lower leg: Swelling and tenderness present. No edema.     Left lower leg: No swelling or tenderness. No edema.  Lymphadenopathy:     Cervical: No cervical adenopathy.  Skin:    General: Skin is warm and dry.     Coloration: Skin is not pale.     Findings: No bruising, erythema, lesion or rash.  Neurological:     Mental Status: She is alert and oriented to person, place, and time.     Cranial Nerves: No cranial nerve deficit.     Sensory: No sensory deficit.     Motor: No weakness.     Coordination: Coordination normal.     Gait: Gait normal.  Psychiatric:        Mood and Affect: Mood normal.        Speech: Speech normal.        Behavior: Behavior normal.        Thought Content: Thought content normal.        Judgment: Judgment normal.    Physical Exam          Labs reviewed: Recent Labs    10/12/23 1338 10/13/23 0652 10/14/23 0554 10/15/23 0512 10/19/23 0912 10/20/23 0650 10/21/23 0548   NA 135 134* 135   < > 139 138 135  K 3.4* 4.0 3.3*   < > 3.7 3.5 3.8  CL 102 102 103   < > 103 102 100  CO2 23 23 25    < > 26 28 27   GLUCOSE 124* 225* 162*   < > 133* 125* 208*  BUN 11 11 14    < > 10 10 10   CREATININE 1.01* 0.86 0.73   < > 0.77 0.76 0.83  CALCIUM  7.7* 7.7* 7.5*   < > 8.3* 8.3* 8.1*  MG 1.6* 2.2 1.9  --   --   --   --   PHOS 2.0*  --  2.5  --   --   --   --    < > = values in this interval not displayed.   Recent Labs    10/11/23 2300 10/12/23 1338 10/13/23 0652  AST 16 21 18   ALT 15 17 18   ALKPHOS 68 59 82  BILITOT 0.6 0.5 0.5  PROT 5.7* 5.6* 6.4*  ALBUMIN 2.8* 2.0* 2.1*   Recent Labs    10/19/23 0912 10/20/23 0650 10/21/23 0548  WBC 11.3* 12.6* 12.1*  NEUTROABS 6.9 7.5 7.6  HGB 10.5* 10.2* 9.7*  HCT 33.4* 33.1* 32.5*  MCV 81.7 82.8 83.1  PLT 368 400 364   Lab Results  Component Value Date   TSH 0.46 11/30/2022   Lab Results  Component Value Date   HGBA1C 11.7 (H) 10/12/2023   Lab Results  Component Value Date   CHOL 143 10/14/2023   HDL 29 (L) 10/14/2023   LDLCALC  69 10/14/2023   LDLDIRECT 64.0 03/26/2023   TRIG 224 (H) 10/14/2023   CHOLHDL 4.9 10/14/2023    Significant Diagnostic Results in last 30 days:  US  RT LOWER EXTREM LTD SOFT TISSUE NON VASCULAR Result Date: 10/17/2023 CLINICAL DATA:  Cellulitis and abscess of right leg. EXAM: ULTRASOUND RIGHT LOWER EXTREMITY LIMITED TECHNIQUE: Ultrasound examination of the lower extremity soft tissues was performed in the area of clinical concern. COMPARISON:  CT 10/11/2023 FINDINGS: Targeted sonographic evaluation in the area of clinical concern labeled right calf. Subcutaneous edema without focal fluid collection. No gross hyperemia. IMPRESSION: Subcutaneous edema without focal fluid collection. Electronically Signed   By: Chadwick Colonel M.D.   On: 10/17/2023 18:41   VAS US  LOWER EXTREMITY VENOUS (DVT) Result Date: 10/14/2023  Lower Venous DVT Study Patient Name:  ALOHILANI YALDO  Date of Exam:    10/13/2023 Medical Rec #: [id number]     Accession #:    [id number] Date of Birth: 12-22-1985     Patient Gender: F Patient Age:   61 years Exam Location:  [facility] Procedure:      VAS US  LOWER EXTREMITY VENOUS (DVT) Referring Phys: PROSPER AMPONSAH --------------------------------------------------------------------------------  Indications: Pain.  Risk Factors: None identified. Limitations: Body habitus, poor ultrasound/tissue interface and patient pain tolerance. Comparison Study: No prior studies. Performing Technologist: Lerry Ransom RVT  Examination Guidelines: A complete evaluation includes B-mode imaging, spectral Doppler, color Doppler, and power Doppler as needed of all accessible portions of each vessel. Bilateral testing is considered an integral part of a complete examination. Limited examinations for reoccurring indications may be performed as noted. The reflux portion of the exam is performed with the patient in reverse Trendelenburg.  +---------+---------------+---------+-----------+----------+-------------------+ RIGHT    CompressibilityPhasicitySpontaneityPropertiesThrombus Aging      +---------+---------------+---------+-----------+----------+-------------------+ CFV      Full           Yes      Yes                                      +---------+---------------+---------+-----------+----------+-------------------+ SFJ      Full                                                             +---------+---------------+---------+-----------+----------+-------------------+ FV Prox  Full                                                             +---------+---------------+---------+-----------+----------+-------------------+ FV Mid   Full                                                             +---------+---------------+---------+-----------+----------+-------------------+ FV DistalFull                                                              +---------+---------------+---------+-----------+----------+-------------------+  PFV      Full                                                             +---------+---------------+---------+-----------+----------+-------------------+ POP      Full           Yes      Yes                                      +---------+---------------+---------+-----------+----------+-------------------+ PTV                                                   Not well visualized +---------+---------------+---------+-----------+----------+-------------------+ PERO                                                  Not well visualized +---------+---------------+---------+-----------+----------+-------------------+   +----+---------------+---------+-----------+----------+--------------+ LEFTCompressibilityPhasicitySpontaneityPropertiesThrombus Aging +----+---------------+---------+-----------+----------+--------------+ CFV Full           Yes      Yes                                 +----+---------------+---------+-----------+----------+--------------+     Summary: RIGHT: - There is no evidence of deep vein thrombosis in the lower extremity. However, portions of this examination were limited- see technologist comments above.  - No cystic structure found in the popliteal fossa.  LEFT: - No evidence of common femoral vein obstruction.   *See table(s) above for measurements and observations. Electronically signed by Delaney Fearing on 10/14/2023 at 9:11:38 AM.    Final    CT TIBIA FIBULA RIGHT WO CONTRAST Result Date: 10/12/2023 CLINICAL DATA:  Necrotizing fasciitis suspected. Foot x-ray done. History of nec fasc. Leukocytosis. EXAM: CT OF THE LOWER RIGHT EXTREMITY WITHOUT CONTRAST TECHNIQUE: Multidetector CT imaging of the right lower extremity was performed according to the standard protocol. RADIATION DOSE REDUCTION: This exam was performed according to the departmental dose-optimization program  which includes automated exposure control, adjustment of the mA and/or kV according to patient size and/or use of iterative reconstruction technique. COMPARISON:  Same day radiographs FINDINGS: Bones/Joint/Cartilage No acute fracture or dislocation. No knee joint effusion. No evidence of osteomyelitis. Ligaments Suboptimally assessed by CT. Muscles and Tendons No evidence of deep tissue infection. Soft tissues Bilateral inguinal lymphadenopathy. 1.9 cm left inguinal lymph node on series 5/image 57 and 2.0 cm right inguinal node on 5/63. There is perilymphatic fat stranding about the right inguinal lymph nodes. Additional enlarged right external iliac node (series 5/image 42) measuring 13 mm. Subcutaneous edema about the right lower extremity greatest in the calf. No soft tissue gas. No abscess. IMPRESSION: 1. Subcutaneous edema about the right lower extremity greatest in the calf is nonspecific but can be seen with cellulitis. No soft tissue gas. No abscess. 2. Bilateral inguinal lymphadenopathy with perilymphatic fat stranding about the right inguinal lymph nodes. Additional enlarged right external iliac node.  These are likely reactive. Electronically Signed   By: Rozell Cornet M.D.   On: 10/12/2023 00:12   CT FEMUR RIGHT WO CONTRAST Result Date: 10/12/2023 CLINICAL DATA:  Necrotizing fasciitis suspected. Foot x-ray done. History of nec fasc. Leukocytosis. EXAM: CT OF THE LOWER RIGHT EXTREMITY WITHOUT CONTRAST TECHNIQUE: Multidetector CT imaging of the right lower extremity was performed according to the standard protocol. RADIATION DOSE REDUCTION: This exam was performed according to the departmental dose-optimization program which includes automated exposure control, adjustment of the mA and/or kV according to patient size and/or use of iterative reconstruction technique. COMPARISON:  Same day radiographs FINDINGS: Bones/Joint/Cartilage No acute fracture or dislocation. No knee joint effusion. No evidence of  osteomyelitis. Ligaments Suboptimally assessed by CT. Muscles and Tendons No evidence of deep tissue infection. Soft tissues Bilateral inguinal lymphadenopathy. 1.9 cm left inguinal lymph node on series 5/image 57 and 2.0 cm right inguinal node on 5/63. There is perilymphatic fat stranding about the right inguinal lymph nodes. Additional enlarged right external iliac node (series 5/image 42) measuring 13 mm. Subcutaneous edema about the right lower extremity greatest in the calf. No soft tissue gas. No abscess. IMPRESSION: 1. Subcutaneous edema about the right lower extremity greatest in the calf is nonspecific but can be seen with cellulitis. No soft tissue gas. No abscess. 2. Bilateral inguinal lymphadenopathy with perilymphatic fat stranding about the right inguinal lymph nodes. Additional enlarged right external iliac node. These are likely reactive. Electronically Signed   By: Rozell Cornet M.D.   On: 10/12/2023 00:12   DG Tibia/Fibula Right Result Date: 10/11/2023 CLINICAL DATA:  Leg pain EXAM: RIGHT TIBIA AND FIBULA - 2 VIEW COMPARISON:  None Available. FINDINGS: No fracture or malalignment. Extensive subcutaneous edema. No osseous erosive change IMPRESSION: No acute osseous abnormality Electronically Signed   By: Esmeralda Hedge M.D.   On: 10/11/2023 23:00   DG Femur Min 2 Views Right Result Date: 10/11/2023 CLINICAL DATA:  Leg pain fever EXAM: RIGHT FEMUR 2 VIEWS COMPARISON:  None Available. FINDINGS: There is no evidence of fracture or other focal bone lesions. Soft tissues are unremarkable. IMPRESSION: Negative. Electronically Signed   By: Esmeralda Hedge M.D.   On: 10/11/2023 22:59   DG Chest 2 View Result Date: 10/11/2023 CLINICAL DATA:  Fever and flu-like symptoms. EXAM: CHEST - 2 VIEW COMPARISON:  04/28/2023 FINDINGS: Stable cardiomegaly. Pulmonary vascular congestion. Left mid lung atelectasis/scarring. The lungs are otherwise clear. No pleural effusion or pneumothorax. IMPRESSION:  Cardiomegaly and pulmonary vascular congestion. Electronically Signed   By: Rozell Cornet M.D.   On: 10/11/2023 20:17    Assessment/Plan  Cellulitis of the Right Lower Extremity Cellulitis of the right lower extremity, initially treated with doxycycline . Continued swelling and pain noted. Imaging showed subcutaneous edema and enlarged lymph nodes in the right groin. Completed initial doxycycline  course. Discussed need for continued antibiotics to prevent complications such as abscess or sepsis. Emphasized follow-up imaging and specialist evaluation to ensure proper healing and rule out vascular issues. - Prescribe doxycycline  100 mg twice daily for 7 days - Order repeat ultrasound of the right lower extremity - Refer to vascular specialist for further evaluation - doxycycline  (VIBRA -TABS) 100 MG tablet; Take 1 tablet (100 mg total) by mouth 2 (two) times daily for 7 days.  Dispense: 14 tablet; Refill: 0 - VAS US  ABI WITH/WO TBI; Future - diclofenac  (VOLTAREN ) 75 MG EC tablet; Take 1 tablet (75 mg total) by mouth 2 (two) times daily.  Dispense: 30 tablet; Refill: 0 -  Ambulatory referral to Vascular Surgery  Congestive Heart Failure Congestive heart failure with recent exacerbation. Cardiomegaly and pulmonary vascular congestion noted on chest x-ray. Reports leg swelling and diminished pulses. Discussed importance of medication adherence to manage symptoms and prevent hospitalizations. Emphasized regular monitoring and follow-up to adjust medications as needed. - Continue current heart failure medications: carvedilol , spironolactone , valsartan  - Monitor for heart failure symptoms - Follow up in 2 weeks to assess response and adjust medications  Uncontrolled Hypertension Hypertension with current BP 170/120 mmHg. Non-adherence to antihypertensive medications due to running out of prescriptions. Discussed risks of uncontrolled hypertension, including stroke and myocardial infarction. Emphasized  medication adherence and regular BP monitoring. - Refill antihypertensive medications: amlodipine , hydrochlorothiazide , carvedilol  - Monitor blood pressure at home - Follow up in 2 weeks to reassess BP control - amLODipine  (NORVASC ) 10 MG tablet; Take 1 tablet (10 mg total) by mouth daily.  Dispense: 90 tablet; Refill: 3 - hydrochlorothiazide  (MICROZIDE ) 12.5 MG capsule; Take 2 capsules (25 mg total) by mouth daily. - spironolactone  (ALDACTONE ) 50 MG tablet; Take 1 tablet (50 mg total) by mouth daily.  Dispense: 90 tablet; Refill: 1 - Lipid panel - TSH - COMPLETE METABOLIC PANEL WITH GFR - CBC with Differential/Platelet  Type 2 diabetes mellitus with hyperglycemia, with long-term current use of insulin  Diabetes mellitus with recent blood glucose levels in the 200s. Non-adherence to diabetes medications due to running out of prescriptions. Discussed risks of uncontrolled diabetes, including neuropathy, retinopathy, and cardiovascular disease. Emphasized medication adherence, regular glucose monitoring, and follow-up care. - Refill diabetes medications: Lantus , Humalog , metformin  - Order HbA1c to assess long-term glucose control - Refer to ophthalmologist for diabetic retinopathy screening - Follow up in 2 weeks to reassess glucose control - metFORMIN  (GLUCOPHAGE -XR) 500 MG 24 hr tablet; Take 2 tablets (1,000 mg total) by mouth in the morning and at bedtime.  Dispense: 120 tablet; Refill: 5 - Ambulatory referral to Ophthalmology - Lipid panel - TSH - COMPLETE METABOLIC PANEL WITH GFR - CBC with Differential/Platelet - Hemoglobin A1c  Mixed hyperlipidemia - continue on Atorvastatin  - Lipid panel  History of Stroke Stroke in 2023 with residual right leg weakness. No new neurological symptoms reported. Discussed importance of monitoring for new symptoms and maintaining medication adherence to prevent recurrent stroke. - Monitor for new neurological symptoms - Continue current  medications: baby aspirin   Diminished pulses in lower extremity Right pedal pulse diminished with tender to palpate on calf muscle. - Ambulatory referral to Vascular Surgery  Former cigarette smoker Continue on nicotine  patch. Smoking cessation advised  Tetanus, diphtheria, and acellular pertussis (Tdap) vaccination declined Tdap offered but declined  General Health Maintenance Due for tetanus vaccine and eye exam. Up to date with flu and pneumonia vaccines. Discussed importance of immunizations and regular eye exams, especially given diabetes. - Offer tetanus vaccine at next visit. Declined Tdap this today. - Provide referral to ophthalmologist for diabetic retinopathy screening  Follow-up - Follow up in 2 weeks to reassess BP, glucose control, and cellulitis treatment - Schedule repeat ultrasoun d of the right lower extremity - Schedule appointment with vascular specialist - Order comprehensive blood work: CBC, BMP, lipid panel, thyroid function tests, HbA1c.   Family/ staff Communication: Reviewed plan of care with patient verbalized understanding   Labs/tests ordered:  - Hemoglobin A1c - Lipid panel - TSH - COMPLETE METABOLIC PANEL WITH GFR - CBC with Differential/Platelet  Next Appointment : Return in about 2 weeks (around 11/12/2023) for Blood pressure and Blood sugar .  Spent 45 minutes of Face to face with patient  >50% time spent counseling; Pre-chart review/reviewing medical record; tests; labs; documentation and developing future plan of care.   Estil Heman, NP

## 2023-10-30 ENCOUNTER — Other Ambulatory Visit: Payer: Self-pay

## 2023-10-30 ENCOUNTER — Encounter (HOSPITAL_COMMUNITY): Payer: Self-pay | Admitting: Internal Medicine

## 2023-10-30 DIAGNOSIS — I5042 Chronic combined systolic (congestive) and diastolic (congestive) heart failure: Secondary | ICD-10-CM | POA: Diagnosis not present

## 2023-10-30 DIAGNOSIS — Z91148 Patient's other noncompliance with medication regimen for other reason: Secondary | ICD-10-CM | POA: Diagnosis not present

## 2023-10-30 DIAGNOSIS — Z79899 Other long term (current) drug therapy: Secondary | ICD-10-CM | POA: Diagnosis not present

## 2023-10-30 DIAGNOSIS — Z833 Family history of diabetes mellitus: Secondary | ICD-10-CM | POA: Diagnosis not present

## 2023-10-30 DIAGNOSIS — E441 Mild protein-calorie malnutrition: Secondary | ICD-10-CM | POA: Diagnosis not present

## 2023-10-30 DIAGNOSIS — Z7984 Long term (current) use of oral hypoglycemic drugs: Secondary | ICD-10-CM | POA: Diagnosis not present

## 2023-10-30 DIAGNOSIS — E66813 Obesity, class 3: Secondary | ICD-10-CM | POA: Diagnosis present

## 2023-10-30 DIAGNOSIS — L03115 Cellulitis of right lower limb: Principal | ICD-10-CM

## 2023-10-30 DIAGNOSIS — Z794 Long term (current) use of insulin: Secondary | ICD-10-CM | POA: Diagnosis not present

## 2023-10-30 DIAGNOSIS — E1165 Type 2 diabetes mellitus with hyperglycemia: Secondary | ICD-10-CM | POA: Diagnosis not present

## 2023-10-30 DIAGNOSIS — E782 Mixed hyperlipidemia: Secondary | ICD-10-CM | POA: Diagnosis not present

## 2023-10-30 DIAGNOSIS — I110 Hypertensive heart disease with heart failure: Secondary | ICD-10-CM | POA: Diagnosis not present

## 2023-10-30 DIAGNOSIS — Z6841 Body Mass Index (BMI) 40.0 and over, adult: Secondary | ICD-10-CM | POA: Diagnosis not present

## 2023-10-30 DIAGNOSIS — D509 Iron deficiency anemia, unspecified: Secondary | ICD-10-CM | POA: Diagnosis present

## 2023-10-30 DIAGNOSIS — F17210 Nicotine dependence, cigarettes, uncomplicated: Secondary | ICD-10-CM | POA: Diagnosis not present

## 2023-10-30 DIAGNOSIS — Z8249 Family history of ischemic heart disease and other diseases of the circulatory system: Secondary | ICD-10-CM | POA: Diagnosis not present

## 2023-10-30 DIAGNOSIS — D638 Anemia in other chronic diseases classified elsewhere: Secondary | ICD-10-CM | POA: Diagnosis not present

## 2023-10-30 DIAGNOSIS — F32A Depression, unspecified: Secondary | ICD-10-CM | POA: Diagnosis not present

## 2023-10-30 LAB — LIPID PANEL
Cholesterol: 178 mg/dL (ref <200)
HDL: 40 mg/dL — ABNORMAL LOW (ref >=50)
Non-HDL Cholesterol (Calc): 138 mg/dL — ABNORMAL HIGH (ref <130)
Total CHOL/HDL Ratio: 4.5 (calc) (ref <5.0)
Triglycerides: 417 mg/dL — ABNORMAL HIGH (ref <150)

## 2023-10-30 LAB — URINALYSIS, W/ REFLEX TO CULTURE (INFECTION SUSPECTED)
Bacteria, UA: NONE SEEN
Bilirubin Urine: NEGATIVE
Glucose, UA: >1000 mg/dL — AB
Ketones, ur: NEGATIVE mg/dL
Nitrite: NEGATIVE
Protein, ur: 100 mg/dL — AB
Specific Gravity, Urine: 1.023 (ref 1.005–1.030)
pH: 6.5 (ref 5.0–8.0)

## 2023-10-30 LAB — CBC WITH DIFFERENTIAL/PLATELET
Abs Immature Granulocytes: 0.03 10*3/uL (ref 0.00–0.07)
Absolute Lymphocytes: 3696 {cells}/uL (ref 850–3900)
Absolute Monocytes: 691 {cells}/uL (ref 200–950)
Basophils Absolute: 67 {cells}/uL (ref 0–200)
Basophils Absolute: 0.1 10*3/uL (ref 0.0–0.1)
Basophils Relative: 0.7 %
Basophils Relative: 1 %
Eosinophils Absolute: 154 {cells}/uL (ref 15–500)
Eosinophils Absolute: 0.2 10*3/uL (ref 0.0–0.5)
Eosinophils Relative: 1.6 %
Eosinophils Relative: 2 %
HCT: 41.8 % (ref 35.0–45.0)
HCT: 34.2 % — ABNORMAL LOW (ref 36.0–46.0)
Hemoglobin: 12.4 g/dL (ref 11.7–15.5)
Hemoglobin: 10.9 g/dL — ABNORMAL LOW (ref 12.0–15.0)
Immature Granulocytes: 0 %
Lymphocytes Relative: 38 %
Lymphs Abs: 3.9 10*3/uL (ref 0.7–4.0)
MCH: 24.7 pg — ABNORMAL LOW (ref 27.0–33.0)
MCH: 25.4 pg — ABNORMAL LOW (ref 26.0–34.0)
MCHC: 29.7 g/dL — ABNORMAL LOW (ref 32.0–36.0)
MCHC: 31.9 g/dL (ref 30.0–36.0)
MCV: 83.1 fL (ref 80.0–100.0)
MCV: 79.7 fL — ABNORMAL LOW (ref 80.0–100.0)
MPV: 11 fL (ref 7.5–12.5)
Monocytes Absolute: 0.9 10*3/uL (ref 0.1–1.0)
Monocytes Relative: 7.2 %
Monocytes Relative: 8 %
Neutro Abs: 4992 {cells}/uL (ref 1500–7800)
Neutro Abs: 5.3 10*3/uL (ref 1.7–7.7)
Neutrophils Relative %: 52 %
Neutrophils Relative %: 51 %
Platelets: 500 10*3/uL — ABNORMAL HIGH (ref 140–400)
Platelets: 412 10*3/uL — ABNORMAL HIGH (ref 150–400)
RBC: 5.03 10*6/uL (ref 3.80–5.10)
RBC: 4.29 MIL/uL (ref 3.87–5.11)
RDW: 14.5 % (ref 11.0–15.0)
RDW: 14.6 % (ref 11.5–15.5)
Total Lymphocyte: 38.5 %
WBC: 9.6 10*3/uL (ref 3.8–10.8)
WBC: 10.3 10*3/uL (ref 4.0–10.5)
nRBC: 0 % (ref 0.0–0.2)

## 2023-10-30 LAB — COMPREHENSIVE METABOLIC PANEL
ALT: 12 U/L (ref 0–44)
AST: 11 U/L — ABNORMAL LOW (ref 15–41)
Albumin: 3.3 g/dL — ABNORMAL LOW (ref 3.5–5.0)
Alkaline Phosphatase: 104 U/L (ref 38–126)
Anion gap: 8 (ref 5–15)
BUN: 18 mg/dL (ref 6–20)
CO2: 26 mmol/L (ref 22–32)
Calcium: 8.7 mg/dL — ABNORMAL LOW (ref 8.9–10.3)
Chloride: 92 mmol/L — ABNORMAL LOW (ref 98–111)
Creatinine, Ser: 0.9 mg/dL (ref 0.44–1.00)
GFR, Estimated: >60 mL/min (ref >60)
Glucose, Bld: 544 mg/dL (ref 70–99)
Potassium: 3.9 mmol/L (ref 3.5–5.1)
Sodium: 126 mmol/L — ABNORMAL LOW (ref 135–145)
Total Bilirubin: 0.3 mg/dL (ref 0.0–1.2)
Total Protein: 7.2 g/dL (ref 6.5–8.1)

## 2023-10-30 LAB — COMPLETE METABOLIC PANEL WITH GFR
AG Ratio: 0.9 (calc) — ABNORMAL LOW (ref 1.0–2.5)
ALT: 13 U/L (ref 6–29)
AST: 14 U/L (ref 10–30)
Albumin: 3.4 g/dL — ABNORMAL LOW (ref 3.6–5.1)
Alkaline phosphatase (APISO): 114 U/L (ref 31–125)
BUN: 19 mg/dL (ref 7–25)
CO2: 27 mmol/L (ref 20–32)
Calcium: 9.3 mg/dL (ref 8.6–10.2)
Chloride: 96 mmol/L — ABNORMAL LOW (ref 98–110)
Creat: 0.92 mg/dL (ref 0.50–0.97)
Globulin: 4 g/dL — ABNORMAL HIGH (ref 1.9–3.7)
Glucose, Bld: 459 mg/dL — ABNORMAL HIGH (ref 65–99)
Potassium: 4.2 mmol/L (ref 3.5–5.3)
Sodium: 132 mmol/L — ABNORMAL LOW (ref 135–146)
Total Bilirubin: 0.3 mg/dL (ref 0.2–1.2)
Total Protein: 7.4 g/dL (ref 6.1–8.1)
eGFR: 83 mL/min/{1.73_m2} (ref >=60)

## 2023-10-30 LAB — CBG MONITORING, ED
Glucose-Capillary: 539 mg/dL (ref 70–99)
Glucose-Capillary: 380 mg/dL — ABNORMAL HIGH (ref 70–99)
Glucose-Capillary: 370 mg/dL — ABNORMAL HIGH (ref 70–99)

## 2023-10-30 LAB — PREGNANCY, URINE: Preg Test, Ur: NEGATIVE

## 2023-10-30 LAB — PROTIME-INR
INR: 0.9 (ref 0.8–1.2)
Prothrombin Time: 12.4 s (ref 11.4–15.2)

## 2023-10-30 LAB — GLUCOSE, CAPILLARY
Glucose-Capillary: 288 mg/dL — ABNORMAL HIGH (ref 70–99)
Glucose-Capillary: 184 mg/dL — ABNORMAL HIGH (ref 70–99)

## 2023-10-30 LAB — APTT: aPTT: 29 s (ref 24–36)

## 2023-10-30 LAB — LACTIC ACID, PLASMA: Lactic Acid, Venous: 1.5 mmol/L (ref 0.5–1.9)

## 2023-10-30 LAB — HEMOGLOBIN A1C
Hgb A1c MFr Bld: 11.3 %{Hb} — ABNORMAL HIGH (ref <5.7)
Mean Plasma Glucose: 278 mg/dL
eAG (mmol/L): 15.4 mmol/L

## 2023-10-30 LAB — TSH: TSH: 1.13 m[IU]/L

## 2023-10-30 MED ORDER — ENOXAPARIN SODIUM 60 MG/0.6ML IJ SOSY
60.0000 mg | PREFILLED_SYRINGE | INTRAMUSCULAR | Status: DC
  Administered 2023-10-30 – 2023-11-01 (×3): 60 mg via SUBCUTANEOUS
  Filled 2023-10-30 (×3): qty 0.6

## 2023-10-30 MED ORDER — ASPIRIN 81 MG PO CHEW
81.0000 mg | CHEWABLE_TABLET | Freq: Every day | ORAL | Status: DC
  Administered 2023-10-30 – 2023-11-02 (×4): 81 mg via ORAL
  Filled 2023-10-30 (×4): qty 1

## 2023-10-30 MED ORDER — ACETAMINOPHEN 650 MG RE SUPP: 650.0000 mg | Freq: Four times a day (QID) | RECTAL | Status: DC | PRN

## 2023-10-30 MED ORDER — HYDROCHLOROTHIAZIDE 12.5 MG PO CAPS: 25.0000 mg | ORAL_CAPSULE | Freq: Every day | ORAL | Status: DC

## 2023-10-30 MED ORDER — OXYCODONE HCL 5 MG PO TABS
5.0000 mg | ORAL_TABLET | ORAL | Status: DC | PRN
  Administered 2023-10-30 – 2023-11-02 (×3): 5 mg via ORAL
  Filled 2023-10-30 (×3): qty 1

## 2023-10-30 MED ORDER — ROSUVASTATIN CALCIUM 20 MG PO TABS
20.0000 mg | ORAL_TABLET | Freq: Every day | ORAL | Status: DC
  Administered 2023-10-31 – 2023-11-02 (×3): 20 mg via ORAL
  Filled 2023-10-30: qty 2
  Filled 2023-10-30 (×4): qty 1

## 2023-10-30 MED ORDER — ACETAMINOPHEN 325 MG PO TABS: 650.0000 mg | ORAL_TABLET | Freq: Four times a day (QID) | ORAL | Status: DC | PRN

## 2023-10-30 MED ORDER — ONDANSETRON HCL 4 MG PO TABS: 4.0000 mg | ORAL_TABLET | Freq: Four times a day (QID) | ORAL | Status: DC | PRN

## 2023-10-30 MED ORDER — CEFADROXIL 500 MG PO CAPS
500.0000 mg | ORAL_CAPSULE | Freq: Two times a day (BID) | ORAL | 0 refills | Status: DC
  Filled 2023-10-30: qty 20, 10d supply, fill #0

## 2023-10-30 MED ORDER — ACETAMINOPHEN 325 MG PO TABS: 650.0000 mg | ORAL_TABLET | ORAL | Status: DC | PRN

## 2023-10-30 MED ORDER — ONDANSETRON HCL 4 MG/2ML IJ SOLN
4.0000 mg | Freq: Four times a day (QID) | INTRAMUSCULAR | Status: DC | PRN
  Administered 2023-10-30: 4 mg via INTRAVENOUS
  Filled 2023-10-30: qty 2

## 2023-10-30 MED ORDER — SPIRONOLACTONE 12.5 MG HALF TABLET: 50.0000 mg | ORAL_TABLET | Freq: Every day | ORAL | Status: DC

## 2023-10-30 MED ORDER — HYDROCHLOROTHIAZIDE 25 MG PO TABS
25.0000 mg | ORAL_TABLET | Freq: Every day | ORAL | Status: DC
  Administered 2023-10-30: 25 mg via ORAL
  Filled 2023-10-30: qty 1

## 2023-10-30 MED ORDER — ALBUTEROL SULFATE HFA 108 (90 BASE) MCG/ACT IN AERS: 2.0000 | INHALATION_SPRAY | RESPIRATORY_TRACT | Status: DC | PRN

## 2023-10-30 MED ORDER — HYDROMORPHONE HCL 1 MG/ML IJ SOLN
1.0000 mg | Freq: Once | INTRAMUSCULAR | Status: AC
  Administered 2023-10-30: 1 mg via INTRAVENOUS
  Filled 2023-10-30: qty 1

## 2023-10-30 MED ORDER — INSULIN ASPART 100 UNIT/ML IJ SOLN
0.0000 [IU] | Freq: Every day | INTRAMUSCULAR | Status: DC
Start: 2023-10-30 — End: 2023-11-02
  Administered 2023-10-31 – 2023-11-01 (×2): 2 [IU] via SUBCUTANEOUS

## 2023-10-30 MED ORDER — HYDROCODONE-ACETAMINOPHEN 5-325 MG PO TABS
1.0000 | ORAL_TABLET | Freq: Once | ORAL | Status: AC
  Administered 2023-10-30: 1 via ORAL
  Filled 2023-10-30: qty 1

## 2023-10-30 MED ORDER — NAPROXEN 250 MG PO TABS
500.0000 mg | ORAL_TABLET | Freq: Two times a day (BID) | ORAL | Status: DC
  Administered 2023-10-30 – 2023-11-02 (×7): 500 mg via ORAL
  Filled 2023-10-30 (×7): qty 2

## 2023-10-30 MED ORDER — HYDRALAZINE HCL 20 MG/ML IJ SOLN: 5.0000 mg | Freq: Once | INTRAMUSCULAR | Status: DC | PRN

## 2023-10-30 MED ORDER — IRBESARTAN 150 MG PO TABS
300.0000 mg | ORAL_TABLET | Freq: Every day | ORAL | Status: DC
  Administered 2023-10-30 – 2023-11-02 (×4): 300 mg via ORAL
  Filled 2023-10-30 (×4): qty 2

## 2023-10-30 MED ORDER — INSULIN ASPART 100 UNIT/ML IV SOLN
10.0000 [IU] | Freq: Once | INTRAVENOUS | Status: AC
  Administered 2023-10-30: 10 [IU] via INTRAVENOUS

## 2023-10-30 MED ORDER — ALBUTEROL SULFATE (2.5 MG/3ML) 0.083% IN NEBU: 2.5000 mg | INHALATION_SOLUTION | RESPIRATORY_TRACT | Status: DC | PRN

## 2023-10-30 MED ORDER — INSULIN GLARGINE-YFGN 100 UNIT/ML ~~LOC~~ SOLN
25.0000 [IU] | Freq: Every day | SUBCUTANEOUS | Status: DC
  Administered 2023-10-30 – 2023-10-31 (×2): 25 [IU] via SUBCUTANEOUS
  Filled 2023-10-30: qty 250
  Filled 2023-10-30: qty 0.25

## 2023-10-30 MED ORDER — INSULIN ASPART 100 UNIT/ML IJ SOLN
0.0000 [IU] | Freq: Three times a day (TID) | INTRAMUSCULAR | Status: DC
  Administered 2023-10-30: 20 [IU] via SUBCUTANEOUS
  Administered 2023-10-30 – 2023-10-31 (×3): 11 [IU] via SUBCUTANEOUS
  Administered 2023-11-01 (×3): 15 [IU] via SUBCUTANEOUS
  Administered 2023-11-02: 4 [IU] via SUBCUTANEOUS

## 2023-10-30 MED ORDER — CARVEDILOL 12.5 MG PO TABS: 25.0000 mg | ORAL_TABLET | Freq: Two times a day (BID) | ORAL | Status: DC

## 2023-10-30 MED ORDER — NICOTINE 14 MG/24HR TD PT24
14.0000 mg | MEDICATED_PATCH | Freq: Every day | TRANSDERMAL | Status: DC
  Administered 2023-10-31 – 2023-11-02 (×3): 14 mg via TRANSDERMAL
  Filled 2023-10-30 (×4): qty 1

## 2023-10-30 MED ORDER — METFORMIN HCL ER 500 MG PO TB24
1000.0000 mg | ORAL_TABLET | Freq: Two times a day (BID) | ORAL | Status: DC
  Administered 2023-10-30 – 2023-11-02 (×7): 1000 mg via ORAL
  Filled 2023-10-30 (×7): qty 2

## 2023-10-30 MED ORDER — TORSEMIDE 20 MG PO TABS: 40.0000 mg | ORAL_TABLET | Freq: Every day | ORAL | Status: DC

## 2023-10-30 MED ORDER — AMLODIPINE BESYLATE 10 MG PO TABS
10.0000 mg | ORAL_TABLET | Freq: Every day | ORAL | Status: DC
  Administered 2023-10-30 – 2023-11-02 (×4): 10 mg via ORAL
  Filled 2023-10-30 (×3): qty 1
  Filled 2023-10-30: qty 2

## 2023-10-30 NOTE — ED Notes (Signed)
FSBS 380. Administered 0800 medications. Assisted patient to the restroom and back into bed.

## 2023-10-30 NOTE — ED Notes (Signed)
Pt states she is leaving AMA. States to RN, "I can snatch all of this out or you can take it out" upon ambulating back to treatment room from bathroom. Pt stated her "ride is on the way".

## 2023-10-30 NOTE — H&P (Signed)
History and Physical    Patient: Carolyn Zimmerman ZOX:[id number] DOB: 12/15/1985 DOA: 10/29/2023 DOS: the patient was seen and examined on 10/30/2023 PCP: Ngetich, Donalee Citrin, NP  Patient coming from: Home  Chief Complaint:  Chief Complaint  Patient presents with   Leg Pain   HPI: Carolyn Zimmerman is a 38 y.o. female with medical history significant of anxiety, depression, coronavirus infection, left MCA watershed CVA, hyperlipidemia, hypertension, hypertensive heart disease, chronic combined systolic heart failure with EF of 40 to 45% in June 2024, jaundice, class III obesity, tobacco use, history of acute pancreatitis, insulin-dependent type 2 diabetes,  tubo-ovarian abscess, hidradenitis suppurativa, chest wall abscesses, history of sepsis secondary to necrotizing fasciitis due to panniculitis with abdominal wall abscess requiring panniculectomy who presented to emergency department with reexacerbation of her RLE cellulitis with erythema, edema, calor and TTP.  She finished her oral antibiotics after discharge.  She went to see her primary who ordered additional antibiotics, but the patient was concerned given the sepsis picture that she had on her first hospitalization so she went to the emergency department.  Positive fever, fatigue and malaise.  No chills or night sweats. No sore throat, rhinorrhea, dyspnea, wheezing or hemoptysis.  No chest pain, palpitations, diaphoresis, PND, orthopnea or pitting edema of the lower extremities.  No abdominal pain, diarrhea, constipation, melena or hematochezia.  No flank pain, dysuria, frequency or hematuria.  No polyuria, polydipsia, polyphagia or blurred vision.  Lab work: Urinalysis was colorless with greater than 1000 glucose and protein of 100 mg/dL.  Small hemoglobin and small leukocyte esterase.  CBC showed a white count of 10.3, hemoglobin 10.9 g/dL and platelets 811.  Normal PT, INR and PTT.  Lactic acid was normal.  Pregnancy test was negative.  CMP showed  sodium 126, chloride 92 and the rest of the electrolytes and renal function were normal after calcium correction.  Glucose 144 mg/dL, albumin 3.3 g/dL and AST 11 units/L.  Normal ALT, alk phos and total bilirubin.  Imaging: Portable 1 view chest radiograph showing stable mild cardiomegaly.  Stable lingular lineal scarring and/or atelectasis.    ED course: Initial vital signs were temperature 98.3 F, pulse 89, respiration 20, BP 197/112 mmHg O2 sat 100% on room air.  The patient received ceftriaxone 2 g IVPB, hydrochlorothiazide 25 mg p.o., Norco 5/325 mg tablet x 1, hydromorphone 1 mg IVP x 1, NovoLog 10 units IV x 1 and Avapro 300 mg p.o. x 1.  Review of Systems: As mentioned in the history of present illness. All other systems reviewed and are negative. 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 She has never used smokeless tobacco. She reports that  she does not currently use alcohol. She reports that she does not use drugs.  Allergies  Allergen Reactions   Contrast Media [Iodinated Contrast Media] Hives and Swelling    Family History  Problem Relation Age of Onset   Hypertension Mother    Diabetes  Father    Hypertension Father     Prior to Admission medications   Medication Sig Start Date End Date Taking? Authorizing Provider  albuterol (VENTOLIN HFA) 108 (90 Base) MCG/ACT inhaler Inhale 2 puffs into the lungs every 4 (four) hours as needed for wheezing or shortness of breath. 10/29/23  Yes Ngetich, [name], NP  cefadroxil (DURICEF) 500 MG capsule Take 1 capsule (500 mg total) by mouth 2 (two) times daily. 10/30/23  Yes Dione Booze, MD  ALEVE 220 MG tablet Take 220-440 mg by mouth 2 (two) times daily as needed (for headaches or mild pain).    [provider]  amLODipine (NORVASC) 10 MG tablet Take 1 tablet (10 mg total) by mouth daily. 10/29/23   Ngetich, [name], NP  aspirin 81 MG chewable tablet Chew 1 tablet (81 mg total) by mouth daily. 10/29/23   Ngetich, Donalee Citrin, NP  blood glucose meter kit and supplies Dispense based on patient and insurance preference. Use up to four times daily as directed. (FOR ICD-9 250.00, 250.01). 06/02/20   Kallie Locks, FNP  carvedilol (COREG) 25 MG tablet Take 1 tablet (25 mg total) by mouth 2 (two) times daily. 10/29/23   Ngetich, [name], NP  Continuous Glucose Sensor (DEXCOM G7 SENSOR) MISC use to monitor blood glucose 03/29/23   Motwani, Carin Hock, MD  diclofenac (VOLTAREN) 75 MG EC tablet Take 1 tablet (75 mg total) by mouth 2 (two) times daily. 10/29/23   Ngetich, [name], NP  doxycycline (VIBRA-TABS) 100 MG tablet Take 1 tablet (100 mg total) by mouth 2 (two) times daily for 7 days. 10/29/23 11/05/23  Ngetich, [name], NP  hydrochlorothiazide (MICROZIDE) 12.5 MG capsule Take 2 capsules (25 mg total) by mouth daily. 10/29/23   Ngetich, [name], NP  Insulin Glargine Solostar (LANTUS) 100 UNIT/ML Solostar Pen Inject 25 Units into the skin daily. 10/29/23   Ngetich, [name], NP  insulin lispro (HUMALOG) 100 UNIT/ML KwikPen Inject 10 Units into the skin 3 (three) times daily. 10/29/23   Ngetich, [name], NP  metFORMIN (GLUCOPHAGE-XR) 500 MG 24 hr tablet Take  2 tablets (1,000 mg total) by mouth in the morning and at bedtime. 10/29/23   Ngetich, [name], NP  nicotine (NICODERM CQ - DOSED IN MG/24 HOURS) 14 mg/24hr patch Place 1 patch (14 mg total) onto the skin daily. 10/29/23   Ngetich, [name], NP  rosuvastatin (CRESTOR) 20 MG tablet Take 1 tablet (20 mg total) by mouth daily. 10/29/23   Ngetich, [name], NP  spironolactone (ALDACTONE) 50 MG tablet Take 1 tablet (50 mg total) by mouth daily. 10/29/23   Ngetich, [name], NP  torsemide (DEMADEX) 20 MG tablet Take 2 tablets (40 mg total) by mouth daily. 10/29/23 01/27/24  Ngetich, [name], NP  valsartan (DIOVAN) 320 MG tablet Take 1 tablet (320 mg total) by mouth daily. 10/29/23   Ngetich, Donalee Citrin, NP    Physical Exam: Vitals:   10/30/23 1315 10/30/23 1330 10/30/23 1506 10/30/23 1536  BP: 135/75 114/86 (!) 148/98   Pulse: 69 69 75   Resp:   19   Temp:   97.8 F (36.6 C)   TempSrc:   Oral  SpO2: 100% 100% 100%   Weight:    125.9 kg   Physical Exam Vitals and nursing note reviewed.  Constitutional:      General: She is awake. She is not in acute distress.    Appearance: Normal appearance. She is morbidly obese. She is ill-appearing.  HENT:     Head: Normocephalic.     Nose: No rhinorrhea.     Mouth/Throat:     Mouth: Mucous membranes are dry.  Eyes:     General: No scleral icterus.    Pupils: Pupils are equal, round, and reactive to light.  Neck:     Vascular: No JVD.  Cardiovascular:     Rate and Rhythm: Normal rate and regular rhythm.     Heart sounds: S1 normal and S2 normal.  Pulmonary:     Effort: Pulmonary effort is normal.     Breath sounds: Normal breath sounds. No wheezing, rhonchi or rales.  Abdominal:     General: Abdomen is protuberant. Bowel sounds are normal. There is no distension.     Palpations: Abdomen is soft.     Tenderness: There is no abdominal tenderness. There is no right CVA tenderness or left CVA tenderness.  Musculoskeletal:     Cervical back: Neck supple.      Right lower leg: No edema.     Left lower leg: No edema.  Skin:    General: Skin is warm and dry.     Findings: Erythema present.     Comments: Positive edema, calor and tenderness of RLE.  Neurological:     General: No focal deficit present.     Mental Status: She is alert and oriented to person, place, and time.  Psychiatric:        Mood and Affect: Mood normal.        Behavior: Behavior normal. Behavior is cooperative.     Data Reviewed:  Results are pending, will review when available. 03/09/2023 echocardiogram report. IMPRESSIONS:   1. Left ventricular ejection fraction, by estimation, is 40 to 45%. The  left ventricle has mildly decreased function. The left ventricle  demonstrates global hypokinesis. The left ventricular internal cavity size  was moderately dilated. There is moderate   eccentric left ventricular hypertrophy. Left ventricular diastolic  parameters are consistent with Grade II diastolic dysfunction  (pseudonormalization).   2. Right ventricular systolic function is normal. The right ventricular  size is normal. Tricuspid regurgitation signal is inadequate for assessing  PA pressure.   3. Left atrial size was severely dilated.   4. The mitral valve is normal in structure. No evidence of mitral valve  regurgitation.   5. The aortic valve is tricuspid. Aortic valve regurgitation is not  visualized. No aortic stenosis is present.   6. The inferior vena cava is normal in size with greater than 50%  respiratory variability, suggesting right atrial pressure of 3 mmHg.   EKG: Vent. rate 79 BPM PR interval 186 ms QRS duration 115 ms QT/QTcB 430/493 ms P-R-T axes 50 36 145 Sinus rhythm Left atrial enlargement LVH with secondary repolarization abnormality Anterior ST elevation, probably due to LVH Borderline prolonged QT interval  Assessment and Plan: Principal Problem:   Cellulitis of right lower extremity IP/MedSurg. Continue IV fluids. Keep  n.p.o. for now. Analgesics as needed. Antiemetics as needed. Continue ceftriaxone 2 g IVPB daily. Pantoprazole 40 mg IVP daily. Follow CBC, CMP AM.  Active Problems:   Hypertension  Heart failure with mildly reduced EF (HCC) Continue amlodipine 10  mg p.o. daily.   Continue carvedilol 25 mg p.o. daily. Continue spironolactone 50 mg p.o. daily. Continue torsemide 40 mg p.o. daily. Continue valsartan 320 mg p.o. daily.     Hx of medication noncompliance Briefly advised on risks of further cardiovascular complications. Will refer to the [facility] team. Asked to check if she qualifies for [facility] Medicaid.     Uncontrolled diabetes mellitus with hyperglycemia (HCC) Carbohydrate modified diet. Resume Semglee 25 units SQ daily. CBG monitoring with RI SS.     Obesity, Class III, BMI 40-49.9 (morbid obesity) (HCC) Current BMI 43.54 kg/m. Lifestyle modifications. Follow-up with closely PCP and/or bariatric clinic.     Mixed hyperlipidemia No longer on rosuvastatin. Needs to decrease health insurance coverage. Follow-up closely with PCP.     Anxiety   Depression No SI or HI. No longer on meds. Follow-up with PCP or behavioral health as an outpatient.     History of CVA (cerebrovascular accident) Supportive care.     Tobacco abuse Tobacco cessation advised. Nicotine replacement therapy ordered.     Protein-calorie malnutrition, mild (HCC) In the setting of anemia/chronic illness/obesity May benefit from protein supplementation. Consider nutritional services evaluation. Follow-up albumin level.     Advance Care Planning:   Code Status: Full Code   Consults:   Family Communication:   Severity of Illness: The appropriate patient status for this patient is OBSERVATION. Observation status is judged to be reasonable and necessary in order to provide the required intensity of service to ensure the patient's safety. The patient's presenting symptoms, physical exam findings, and  initial radiographic and laboratory data in the context of their medical condition is felt to place them at decreased risk for further clinical deterioration. Furthermore, it is anticipated that the patient will be medically stable for discharge from the hospital within 2 midnights of admission.   Author: Bobette Mo, MD 10/30/2023 3:58 PM  For on call review [email or web address].   This document was prepared using Dragon voice recognition software and may contain some unintended transcription errors.

## 2023-10-30 NOTE — Progress Notes (Signed)
PHARMACY ROUNDING NOTE  Carolyn Zimmerman is a 38 y.o. female awaiting admission. A chart review was completed to evaluate prior to admission medications, antibiotic therapy and labs/vitals. The following interventions were made:  Sliding scale insulin added Pharmacy tech to complete med history to evaluate PTA meds  This was discussed with the ED or admitting provider and/or nurse/paramedic.   Lysle Pearl, PharmD, BCPS, BCEMP Clinical Pharmacist Please see AMION for all pharmacy numbers 10/30/2023 8:36 AM

## 2023-10-30 NOTE — Progress Notes (Signed)
Plan of Care Note for accepted transfer   Patient: Carolyn Zimmerman MRN: [id number]   DOA: 10/29/2023  Facility requesting transfer: [facility]   Requesting Provider: Dr. Preston Fleeting   Reason for transfer: RLE cellulitis   Facility course: 38 yr old female with HTN, HLD, DM, HFmrEF, depression, anxiety and recent admission for RLE cellulitis who presents with RLE redness, swelling, and pain. She has also run out of all of her medications.   She is afebrile with normal WBC and normal lactate. BP is elevated and serum glucose is 544 without DKA.   Blood cultures were collected and she was treated with 10 units IV Novolog and Rocephin. Her home antihypertensives were ordered.   Plan of care: The patient is accepted for admission to Progressive unit, at [facility].   Author: Briscoe Deutscher, MD 10/30/2023  Check [email or web address] for on-call coverage.  Nursing staff, Please call [facility] Admits & Consults System-Wide number on Amion as soon as patient's arrival, so appropriate admitting provider can evaluate the pt.

## 2023-10-30 NOTE — Inpatient Diabetes Management (Signed)
Inpatient Diabetes Program Recommendations  AACE/ADA: New Consensus Statement on Inpatient Glycemic Control  Target Ranges:  Prepandial:   less than 140 mg/dL      Peak postprandial:   less than 180 mg/dL (1-2 hours)      Critically ill patients:  140 - 180 mg/dL    Latest Reference Range & Units 10/30/23 03:52 10/30/23 08:02  Glucose-Capillary 70 - 99 mg/dL 578 (HH) 469 (H)    Latest Reference Range & Units 10/29/23 11:48 10/29/23 23:25  CO2 22 - 32 mmol/L 27 26  Glucose 70 - 99 mg/dL 629 (H) 528 (HH)  Anion gap 5 - 15   8    Latest Reference Range & Units 01/22/23 09:31 03/26/23 10:09 10/12/23 09:52 10/29/23 11:48  Hemoglobin A1C <5.7 % of total Hgb 13.0 (H) 12.0 (H) 11.7 (H) 11.3 (H)   Review of Glycemic Control  Diabetes history: DM2 Outpatient Diabetes medications: Lantus 25 units daily, Humalog 10 units TID with meals, Metformin 1000 mg BID Current orders for Inpatient glycemic control: Semglee 25 units daily, Novolog 0-20 units TID with meals, Novolog 0-5 units at bedtime, Metformin XR 1000 mg BID  NOTE: Patient was recently inpatient at [facility] 10/12/23-10/21/23 and seen by inpatient diabetes coordinator on 10/15/23. Per diabetes coordinator note on 10/15/23, patient had not taken any insulin since October due to losing her job and insurance.  Patient was suppose to receive a MATCH voucher at discharge to get prescribed medications. Per discharge summary on 10/21/23 patient was prescribed Lantus 25 units daily, Humalog 10 units TID, and Metformin XR 1000 mg BID for DM control.   Patient presented to [facility] on 10/29/23 due to right leg redness and pain and concerned about being septic. Per note by DR. Preston Fleeting on 10/29/23, patient "states that she had run out of all of her other medications 4 days ago, including insulin." Patient now has Medicaid insurance showing in chart. Inpatient diabetes team will follow up with patient once transferred to [facility].  Thanks, Orlando Penner, RN, MSN, CDCES Diabetes Coordinator Inpatient Diabetes Program [phone] (Team Pager from 8am to 5pm)

## 2023-10-30 NOTE — ED Notes (Signed)
Called Thomas at [facility] for transport 13:22

## 2023-10-30 NOTE — ED Notes (Signed)
Pt agreeable to stay at this time. IV restarted.

## 2023-10-30 NOTE — Plan of Care (Signed)

## 2023-10-30 NOTE — Discharge Instructions (Addendum)
You have chosen to leave AGAINST MEDICAL ADVICE.  I am concerned that an uncontrolled infection in your leg.  In a worse case scenario lead to your losing the leg.  If you change your mind about being admitted to the hospital, you are welcome to return at any time.  In the meantime, I am ordering a different antibiotic for you.  It is very important that you follow-up with your primary care provider.  It is possible that you may need to be on a different antibiotic if this 1 is not showing any improvement.

## 2023-10-31 ENCOUNTER — Other Ambulatory Visit (HOSPITAL_COMMUNITY): Payer: Self-pay

## 2023-10-31 ENCOUNTER — Observation Stay (HOSPITAL_COMMUNITY): Payer: Medicaid Other

## 2023-10-31 ENCOUNTER — Telehealth (HOSPITAL_COMMUNITY): Payer: Self-pay | Admitting: Pharmacy Technician

## 2023-10-31 DIAGNOSIS — Z794 Long term (current) use of insulin: Secondary | ICD-10-CM | POA: Diagnosis not present

## 2023-10-31 DIAGNOSIS — Z7984 Long term (current) use of oral hypoglycemic drugs: Secondary | ICD-10-CM | POA: Diagnosis not present

## 2023-10-31 DIAGNOSIS — F17210 Nicotine dependence, cigarettes, uncomplicated: Secondary | ICD-10-CM | POA: Diagnosis present

## 2023-10-31 DIAGNOSIS — L0390 Cellulitis, unspecified: Secondary | ICD-10-CM | POA: Diagnosis present

## 2023-10-31 DIAGNOSIS — Z6841 Body Mass Index (BMI) 40.0 and over, adult: Secondary | ICD-10-CM | POA: Diagnosis not present

## 2023-10-31 DIAGNOSIS — E1165 Type 2 diabetes mellitus with hyperglycemia: Secondary | ICD-10-CM | POA: Diagnosis present

## 2023-10-31 DIAGNOSIS — Z833 Family history of diabetes mellitus: Secondary | ICD-10-CM | POA: Diagnosis not present

## 2023-10-31 DIAGNOSIS — R079 Chest pain, unspecified: Secondary | ICD-10-CM | POA: Diagnosis not present

## 2023-10-31 DIAGNOSIS — F32A Depression, unspecified: Secondary | ICD-10-CM | POA: Diagnosis present

## 2023-10-31 DIAGNOSIS — D638 Anemia in other chronic diseases classified elsewhere: Secondary | ICD-10-CM | POA: Diagnosis present

## 2023-10-31 DIAGNOSIS — M7989 Other specified soft tissue disorders: Secondary | ICD-10-CM | POA: Diagnosis not present

## 2023-10-31 DIAGNOSIS — Z91148 Patient's other noncompliance with medication regimen for other reason: Secondary | ICD-10-CM | POA: Diagnosis not present

## 2023-10-31 DIAGNOSIS — E66813 Obesity, class 3: Secondary | ICD-10-CM | POA: Diagnosis present

## 2023-10-31 DIAGNOSIS — I110 Hypertensive heart disease with heart failure: Secondary | ICD-10-CM | POA: Diagnosis present

## 2023-10-31 DIAGNOSIS — F419 Anxiety disorder, unspecified: Secondary | ICD-10-CM | POA: Diagnosis present

## 2023-10-31 DIAGNOSIS — D509 Iron deficiency anemia, unspecified: Secondary | ICD-10-CM | POA: Diagnosis present

## 2023-10-31 DIAGNOSIS — I5042 Chronic combined systolic (congestive) and diastolic (congestive) heart failure: Secondary | ICD-10-CM | POA: Diagnosis present

## 2023-10-31 DIAGNOSIS — Z8249 Family history of ischemic heart disease and other diseases of the circulatory system: Secondary | ICD-10-CM | POA: Diagnosis not present

## 2023-10-31 DIAGNOSIS — Z8739 Personal history of other diseases of the musculoskeletal system and connective tissue: Secondary | ICD-10-CM | POA: Diagnosis not present

## 2023-10-31 DIAGNOSIS — L03115 Cellulitis of right lower limb: Secondary | ICD-10-CM | POA: Diagnosis present

## 2023-10-31 DIAGNOSIS — E782 Mixed hyperlipidemia: Secondary | ICD-10-CM | POA: Diagnosis present

## 2023-10-31 DIAGNOSIS — E441 Mild protein-calorie malnutrition: Secondary | ICD-10-CM | POA: Diagnosis present

## 2023-10-31 DIAGNOSIS — Z8673 Personal history of transient ischemic attack (TIA), and cerebral infarction without residual deficits: Secondary | ICD-10-CM | POA: Diagnosis not present

## 2023-10-31 DIAGNOSIS — Z7982 Long term (current) use of aspirin: Secondary | ICD-10-CM | POA: Diagnosis not present

## 2023-10-31 DIAGNOSIS — Z79899 Other long term (current) drug therapy: Secondary | ICD-10-CM | POA: Diagnosis not present

## 2023-10-31 LAB — CBC
HCT: 36.8 % (ref 36.0–46.0)
Hemoglobin: 11 g/dL — ABNORMAL LOW (ref 12.0–15.0)
MCH: 25.2 pg — ABNORMAL LOW (ref 26.0–34.0)
MCHC: 29.9 g/dL — ABNORMAL LOW (ref 30.0–36.0)
MCV: 84.4 fL (ref 80.0–100.0)
Platelets: 416 10*3/uL — ABNORMAL HIGH (ref 150–400)
RBC: 4.36 MIL/uL (ref 3.87–5.11)
RDW: 14.6 % (ref 11.5–15.5)
WBC: 11 10*3/uL — ABNORMAL HIGH (ref 4.0–10.5)
nRBC: 0 % (ref 0.0–0.2)

## 2023-10-31 LAB — COMPREHENSIVE METABOLIC PANEL
ALT: 18 U/L (ref 0–44)
AST: 17 U/L (ref 15–41)
Albumin: 2.6 g/dL — ABNORMAL LOW (ref 3.5–5.0)
Alkaline Phosphatase: 74 U/L (ref 38–126)
Anion gap: 12 (ref 5–15)
BUN: 20 mg/dL (ref 6–20)
CO2: 22 mmol/L (ref 22–32)
Calcium: 8.8 mg/dL — ABNORMAL LOW (ref 8.9–10.3)
Chloride: 99 mmol/L (ref 98–111)
Creatinine, Ser: 1.07 mg/dL — ABNORMAL HIGH (ref 0.44–1.00)
GFR, Estimated: >60 mL/min (ref >60)
Glucose, Bld: 292 mg/dL — ABNORMAL HIGH (ref 70–99)
Potassium: 4 mmol/L (ref 3.5–5.1)
Sodium: 133 mmol/L — ABNORMAL LOW (ref 135–145)
Total Bilirubin: 0.4 mg/dL (ref 0.0–1.2)
Total Protein: 7 g/dL (ref 6.5–8.1)

## 2023-10-31 LAB — GLUCOSE, CAPILLARY
Glucose-Capillary: 298 mg/dL — ABNORMAL HIGH (ref 70–99)
Glucose-Capillary: 295 mg/dL — ABNORMAL HIGH (ref 70–99)
Glucose-Capillary: 356 mg/dL — ABNORMAL HIGH (ref 70–99)
Glucose-Capillary: 297 mg/dL — ABNORMAL HIGH (ref 70–99)

## 2023-10-31 MED ORDER — PROCHLORPERAZINE EDISYLATE 10 MG/2ML IJ SOLN
10.0000 mg | Freq: Four times a day (QID) | INTRAMUSCULAR | Status: DC | PRN
  Administered 2023-11-01: 10 mg via INTRAVENOUS
  Filled 2023-10-31: qty 2

## 2023-10-31 MED ORDER — INSULIN GLARGINE-YFGN 100 UNIT/ML ~~LOC~~ SOLN
30.0000 [IU] | Freq: Every day | SUBCUTANEOUS | Status: DC
  Administered 2023-11-01 – 2023-11-02 (×2): 30 [IU] via SUBCUTANEOUS
  Filled 2023-10-31 (×2): qty 0.3

## 2023-10-31 MED ORDER — INSULIN ASPART 100 UNIT/ML IJ SOLN
4.0000 [IU] | Freq: Three times a day (TID) | INTRAMUSCULAR | Status: DC
  Administered 2023-10-31 (×2): 4 [IU] via SUBCUTANEOUS

## 2023-10-31 NOTE — Inpatient Diabetes Management (Addendum)
Inpatient Diabetes Program Recommendations  AACE/ADA: New Consensus Statement on Inpatient Glycemic Control   Target Ranges:  Prepandial:   less than 140 mg/dL      Peak postprandial:   less than 180 mg/dL (1-2 hours)      Critically ill patients:  140 - 180 mg/dL    Latest Reference Range & Units 10/30/23 08:02 10/30/23 12:49 10/30/23 17:30 10/30/23 20:44 10/31/23 07:43  Glucose-Capillary 70 - 99 mg/dL 147 (H) 829 (H) 562 (H) 184 (H) 298 (H)    Latest Reference Range & Units 10/29/23 11:48 10/29/23 23:25  CO2 22 - 32 mmol/L 27 26  Glucose 70 - 99 mg/dL 130 (H) 865 (HH)  Anion gap 5 - 15   8   Review of Glycemic Control  Diabetes history: DM2 Outpatient Diabetes medications: Lantus 25 units QHS, Humalog 6 units TID with meals, Metformin 1000 mg BID Current orders for Inpatient glycemic control: Semglee 25 units daily, Novolog 0-20 units TID with meals, Novolog 0-5 units at bedtime, Metformin XR 1000 mg BID  Inpatient Diabetes Program Recommendations:    Insulin: Please consider increasing Semglee to 30 units daily and ordering Novolog 6 units TID with meals for meal coverage if patient eats at least 50% of meals.  Discharge Recommendations: Other recommendations: Dexcom G7 sensors (#[id number]) Long acting recommendations: Insulin Glargine (LANTUS) Solostar Pen Dose to be determined  Short acting recommendations:  Meal + Correction coverage Insulin aspart (NOVOLOG) FlexPen  Sensitive Scale.  Meal coverage plus correction Hypoglycemia treatment recommendations: Baqsimi 3mg  Supply/Referral recommendations: Pen needles - standard   Use Adult Diabetes Insulin Treatment Post Discharge order set.  NOTE: Patient was recently inpatient at [facility] 10/12/23-10/21/23 and seen by inpatient diabetes coordinator on 10/15/23. Per diabetes coordinator note on 10/15/23, patient had not taken any insulin since October due to losing her job and insurance.  Patient was suppose to receive a  MATCH voucher at discharge to get prescribed medications. Per discharge summary on 10/21/23 patient was prescribed Lantus 25 units daily, Humalog 10 units TID, and Metformin XR 1000 mg BID for DM control.   Patient presented to [facility] on 10/29/23 due to right leg redness and pain and concerned about being septic. Per note by DR. Preston Fleeting on 10/29/23, patient "states that she had run out of all of her other medications 4 days ago, including insulin." Patient now has Medicaid insurance showing in chart.  Spoke to patient over the phone to inquire about DM and medications. Patient states that she did not receive a MATCH voucher at discharge on 10/21/23 so she has not been able to get her DM medications. Patient reports that she applied for Medicaid after discharge on 10/21/23 but she has not heard back from [facility] yet. Informed patient that currently it is showing Medicaid insurance. Informed patient that I would ask outpatient [facility] pharmacy to check and see if Medicaid is active and if it covers Lantus and Humalog insulin pens. Patient states she had been taking Lantus 25 units at bedtime, Humalog 6 units with meals, and Metformin 1000 mg BID when she had insurance and able to get medications. Patient reports that she had also been on Dexcom G7 CGM sensors but had been doing finger sticks lately for glucose monitoring. Patient reports that she had an appointment with Richarda Blade, NP on 10/29/23 and she has a follow up appointment scheduled for 11/12/23. Encouraged patient to take DM medications as prescribed at discharge, to start using the Dexcom G7 sensors, and be  sure to share CGM data with NP at next visit so they can use it to make additional changes with insulin regimen if needed.  Patient verbalized understanding and she has no questions at this time. Sent chat message to outpatient [facility] pharmacy and they were able to verify patient does have Medicaid and it covers Lantus and Novolog insulin pens ($4 copay for each  insulin) and Dexcom G7 CGM sensors ($4 copay). Called patient back to let her know that the Medicaid is showing active and that it covers Lantus and Novolog insulin and the Dexcom G7 sensors with $4 copay for each. Patient would like to do meds to bed if possible. Added Olegario Messier, RN, CM and M. Alvester Morin, RPh to chat message.  Thanks,  Orlando Penner, RN, MSN, CDCES Diabetes Coordinator Inpatient Diabetes Program [phone] (Team Pager from 8am to 5pm)

## 2023-10-31 NOTE — Progress Notes (Signed)
PROGRESS NOTE  Carolyn Zimmerman ZOX:[id number] DOB: 03-Oct-1985 DOA: 10/29/2023 PCP: Caesar Bookman, NP   LOS: 0 days   Brief Narrative / Interim history: 38 year old female with with history of anxiety, depression, left MCA watershed CVA, HTN, HLD, chronic combined CHF with EF of 40-45%, morbid obesity, IDDM, history of necrotizing fasciitis, history of abdominal wall abscess requiring panniculectomy, comes into the hospital with right lower extremity swelling, erythema, increased pain.  She was recently hospitalized with the same, was discharged home on oral antibiotics, and while initially she felt improvement, once antibiotics were discontinued she experienced recurrence of her symptoms.  Subjective / 24h Interval events: Complains of mild intermittent nausea overnight.  Assesement and Plan: Principal Problem:   Cellulitis of right lower extremity Active Problems:   Uncontrolled diabetes mellitus with hyperglycemia (HCC)   Obesity, Class III, BMI 40-49.9 (morbid obesity) (HCC)   Morbid obesity (HCC)   Mixed hyperlipidemia   Anxiety   Depression   History of CVA (cerebrovascular accident)   Tobacco abuse   Hypertension   Heart failure with mildly reduced ejection fraction (HCC)   Class 3 obesity  Principal problem RLE cellulitis -patient started on antibiotics, continue.  Lower extremity Dopplers to check for DVT still pending.  She has mild leukocytosis  Active problems Poorly controlled HTN, chronic systolic CHF-still hypertensive this morning, continue amlodipine, Coreg, spironolactone, torsemide as well as ARB.  Closely monitor blood pressure.  Medication nonadherence-tells me she ran out of her medications, upon last discharge did not get these refilled.  Will make sure upon this hospitalization she will get them refilled at the time of discharge  DM2, uncontrolled, with hyperglycemia-continue glargine, sliding scale  Obesity, morbid-BMI over 40, she would benefit from  weight loss  Prior CVA-supportive care, continue aspirin  Tobacco use-continue nicotine patch  Hyperlipidemia-not taking statin anymore  Scheduled Meds:  amLODipine  10 mg Oral Daily   aspirin  81 mg Oral Daily   carvedilol  25 mg Oral BID WC   enoxaparin (LOVENOX) injection  60 mg Subcutaneous Q24H   insulin aspart  0-20 Units Subcutaneous TID WC   insulin aspart  0-5 Units Subcutaneous QHS   insulin glargine-yfgn  25 Units Subcutaneous Daily   irbesartan  300 mg Oral Daily   metFORMIN  1,000 mg Oral BID WC   naproxen  500 mg Oral BID WC   nicotine  14 mg Transdermal Daily   rosuvastatin  20 mg Oral Daily   spironolactone  50 mg Oral Daily   torsemide  40 mg Oral Daily   Continuous Infusions:  cefTRIAXone (ROCEPHIN)  IV Stopped (10/30/23 2153)   PRN Meds:.acetaminophen **OR** acetaminophen, albuterol, hydrALAZINE, ondansetron **OR** ondansetron (ZOFRAN) IV, oxyCODONE, prochlorperazine  Current Outpatient Medications  Medication Instructions   albuterol (VENTOLIN HFA) 108 (90 Base) MCG/ACT inhaler 2 puffs, Inhalation, Every 4 hours PRN   Aleve 220-1,100 mg, Oral, Daily PRN   amLODipine (NORVASC) 10 mg, Oral, Daily   aspirin 81 mg, Oral, Daily   carvedilol (COREG) 25 mg, Oral, 2 times daily   cefadroxil (DURICEF) 500 mg, Oral, 2 times daily   diclofenac (VOLTAREN) 75 mg, Oral, 2 times daily   doxycycline (VIBRA-TABS) 100 mg, Oral, 2 times daily   hydrochlorothiazide (MICROZIDE) 25 mg, Oral, Daily   Insulin Glargine Solostar (LANTUS) 25 Units, Subcutaneous, Daily   insulin lispro (HUMALOG) 10 Units, Subcutaneous, 3 times daily   metFORMIN (GLUCOPHAGE-XR) 1,000 mg, Oral, 2 times daily   nicotine (NICODERM CQ - DOSED IN MG/24  HOURS) 14 mg, Transdermal, Daily   rosuvastatin (CRESTOR) 20 mg, Oral, Daily   spironolactone (ALDACTONE) 50 mg, Oral, Daily   torsemide (DEMADEX) 40 mg, Oral, Daily   valsartan (DIOVAN) 320 mg, Oral, Daily    Diet Orders (From admission, onward)      Start     Ordered   10/30/23 1556  Diet heart healthy/carb modified Room service appropriate? Yes; Fluid consistency: Thin  Diet effective now       Question Answer Comment  Diet-HS Snack? Nothing   Room service appropriate? Yes   Fluid consistency: Thin      10/30/23 1556            DVT prophylaxis:    Lab Results  Component Value Date   PLT 416 (H) 10/31/2023      Code Status: Full Code  Family Communication: No family at bedside  Status is: Observation The patient will require care spanning > 2 midnights and should be moved to inpatient because: IV antibiotics   Level of care: Med-Surg  Consultants:  None  Objective: Vitals:   10/31/23 0157 10/31/23 0303 10/31/23 0909 10/31/23 1059  BP: (!) 161/94 (!) 158/109 (!) 166/99 (!) 171/76  Pulse: 68 69 73   Resp: 18 17    Temp: 98 F (36.7 C) 98.1 F (36.7 C) 98.3 F (36.8 C)   TempSrc: Oral     SpO2: 100% 100% 100%   Weight:      Height:        Intake/Output Summary (Last 24 hours) at 10/31/2023 1107 Last data filed at 10/30/2023 2155 Gross per 24 hour  Intake 720 ml  Output --  Net 720 ml   Wt Readings from Last 3 Encounters:  10/30/23 125.9 kg  10/29/23 126.1 kg  10/21/23 (!) 140 kg    Examination:  Constitutional: NAD Eyes: no scleral icterus ENMT: Mucous membranes are moist.  Neck: normal, supple Respiratory: clear to auscultation bilaterally, no wheezing, no crackles. Normal respiratory effort.  Cardiovascular: Regular rate and rhythm, no murmurs / rubs / gallops. No LE edema.  Abdomen: non distended, no tenderness. Bowel sounds positive.  Musculoskeletal: no clubbing / cyanosis.   Data Reviewed: I have independently reviewed following labs and imaging studies   CBC Recent Labs  Lab 10/29/23 1148 10/29/23 2325 10/31/23 0438  WBC 9.6 10.3 11.0*  HGB 12.4 10.9* 11.0*  HCT 41.8 34.2* 36.8  PLT 500* 412* 416*  MCV 83.1 79.7* 84.4  MCH 24.7* 25.4* 25.2*  MCHC 29.7* 31.9 29.9*   RDW 14.5 14.6 14.6  LYMPHSABS  --  3.9  --   MONOABS  --  0.9  --   EOSABS 154 0.2  --   BASOSABS 67 0.1  --     Recent Labs  Lab 10/29/23 1148 10/29/23 2325 10/31/23 0438  NA 132* 126* 133*  K 4.2 3.9 4.0  CL 96* 92* 99  CO2 27 26 22   GLUCOSE 459* 544* 292*  BUN 19 18 20   CREATININE 0.92 0.90 1.07*  CALCIUM 9.3 8.7* 8.8*  AST 14 11* 17  ALT 13 12 18   ALKPHOS  --  104 74  BILITOT 0.3 0.3 0.4  ALBUMIN  --  3.3* 2.6*  LATICACIDVEN  --  1.5  --   INR  --  0.9  --   TSH 1.13  --   --   HGBA1C 11.3*  --   --     ------------------------------------------------------------------------------------------------------------------ Recent Labs    10/29/23 1148  CHOL 178  HDL 40*  TRIG 417*  CHOLHDL 4.5    Lab Results  Component Value Date   HGBA1C 11.3 (H) 10/29/2023   ------------------------------------------------------------------------------------------------------------------ Recent Labs    10/29/23 1148  TSH 1.13    Cardiac Enzymes No results for input(s): "CKMB", "TROPONINI", "MYOGLOBIN" in the last 168 hours.  Invalid input(s): "CK" ------------------------------------------------------------------------------------------------------------------    Component Value Date/Time   BNP 112.6 (H) 01/22/2023 1900    CBG: Recent Labs  Lab 10/30/23 0802 10/30/23 1249 10/30/23 1730 10/30/23 2044 10/31/23 0743  GLUCAP 380* 370* 288* 184* 298*    Recent Results (from the past 240 hours)  Blood Culture (routine x 2)     Status: None (Preliminary result)   Collection Time: 10/29/23 11:25 PM   Specimen: BLOOD  Result Value Ref Range Status   Specimen Description   Final    BLOOD LEFT ANTECUBITAL Performed at [facility], [address], [place], [place] [address]    Special Requests   Final    BOTTLES DRAWN AEROBIC AND ANAEROBIC Blood Culture adequate volume Performed at [facility], [address],  [place], [place] [address]    Culture   Final    NO GROWTH < 24 HOURS Performed at [facility], [address], [place], [place] [address]    Report Status PENDING  Incomplete  Blood Culture (routine x 2)     Status: None (Preliminary result)   Collection Time: 10/29/23 11:30 PM   Specimen: BLOOD  Result Value Ref Range Status   Specimen Description   Final    BLOOD RIGHT ANTECUBITAL Performed at [facility], [address] [place], [place] [address]    Special Requests   Final    BOTTLES DRAWN AEROBIC AND ANAEROBIC Blood Culture adequate volume Performed at [facility], [address], [place], [place] [address]    Culture   Final    NO GROWTH < 24 HOURS Performed at [facility], [address], [place], [place] [address]    Report Status PENDING  Incomplete     Radiology Studies: VAS [place] LOWER EXTREMITY VENOUS (DVT) Result Date: 10/31/2023  Lower Venous DVT Study Patient Name:  GENESEE NASE  Date of Exam:   10/31/2023 Medical Rec #: [id number]     Accession #:    [id number] Date of Birth: 1985/11/23     Patient Gender: F Patient Age:   7 years Exam Location:  [facility] Procedure:      VAS [place] LOWER EXTREMITY VENOUS (DVT) Referring Phys: DAVID ORTIZ --------------------------------------------------------------------------------  Indications: Swelling.  Risk Factors: None identified. Limitations: Body habitus, poor ultrasound/tissue interface and patient pain tolerance. Comparison Study: No prior studies. Performing Technologist: Chanda Busing RVT  Examination Guidelines: A complete evaluation includes B-mode imaging, spectral Doppler, color Doppler, and power Doppler as needed of all accessible portions of each vessel. Bilateral testing is considered an integral part of a complete examination. Limited examinations for reoccurring indications may be performed as noted. The reflux portion of the exam is performed with the patient  in reverse Trendelenburg.  +---------+---------------+---------+-----------+----------+--------------+ RIGHT    CompressibilityPhasicitySpontaneityPropertiesThrombus Aging +---------+---------------+---------+-----------+----------+--------------+ CFV      Full           Yes      Yes                                 +---------+---------------+---------+-----------+----------+--------------+ SFJ  Full                                                        +---------+---------------+---------+-----------+----------+--------------+ FV Prox  Full                                                        +---------+---------------+---------+-----------+----------+--------------+ FV Mid   Full                                                        +---------+---------------+---------+-----------+----------+--------------+ FV DistalFull                                                        +---------+---------------+---------+-----------+----------+--------------+ PFV      Full                                                        +---------+---------------+---------+-----------+----------+--------------+ POP      Full           Yes      Yes                                 +---------+---------------+---------+-----------+----------+--------------+ PTV      Full                                                        +---------+---------------+---------+-----------+----------+--------------+ PERO     Full                                                        +---------+---------------+---------+-----------+----------+--------------+   +----+---------------+---------+-----------+----------+--------------+ LEFTCompressibilityPhasicitySpontaneityPropertiesThrombus Aging +----+---------------+---------+-----------+----------+--------------+ CFV Full           Yes      Yes                                  +----+---------------+---------+-----------+----------+--------------+    Summary: RIGHT: - There is no evidence of deep vein thrombosis in the lower extremity. However, portions of this examination were limited- see technologist comments above.  - No cystic structure found in the popliteal fossa.  LEFT: - No evidence of common femoral vein obstruction.   *See table(s) above for measurements and observations.  Preliminary      Pamella Pert, MD, PhD [facility]  Between 7 am - 7 pm I am available, please contact me via Amion (for emergencies) or Securechat (non urgent messages)  Between 7 pm - 7 am I am not available, please contact night coverage MD/APP via Amion

## 2023-10-31 NOTE — Plan of Care (Signed)

## 2023-10-31 NOTE — Progress Notes (Signed)
Right lower extremity venous duplex has been completed. Preliminary results can be found in CV Proc through chart review.   10/31/23 9:08 AM Olen Cordial RVT
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
## 2023-10-31 NOTE — Progress Notes (Addendum)
0915-Md on floor completing rounding,  this nurse has concern with meds and addressed with md Gherghe,   md states he will check meds and speak with pt    Will await md to speak with this nurse   1210-  pt tolerating meds well no acute distress noted   vs stable   1925- pt calm resting in bed   medicated per order   no acute distress noted   pt calm and cooperative   safety measures in place   handoff report  given to oncoming Rn April

## 2023-10-31 NOTE — Telephone Encounter (Signed)
Patient Product/process development scientist completed.    The patient is insured through [facility] MEDICAID.     Ran test claim for Lantus Pen  and the current 30 day co-pay is $4.00.  Ran test claim for Novolog Pen  and the current 30 day co-pay is $4.00.  Ran test claim for Dexcom G7 sensor  and the current 30 day co-pay is $4.00.  This test claim was processed through [facility]- copay amounts may vary at other pharmacies due to pharmacy/plan contracts, or as the patient moves through the different stages of their insurance plan.     Roland Earl, CPHT Pharmacy Technician III Certified Patient Advocate [facility] Patient Advocate Team Direct Number: [phone]  Fax: [phone]

## 2023-11-01 ENCOUNTER — Telehealth: Payer: Self-pay

## 2023-11-01 ENCOUNTER — Inpatient Hospital Stay (HOSPITAL_COMMUNITY): Payer: Medicaid Other

## 2023-11-01 DIAGNOSIS — R079 Chest pain, unspecified: Secondary | ICD-10-CM | POA: Diagnosis not present

## 2023-11-01 DIAGNOSIS — L03115 Cellulitis of right lower limb: Secondary | ICD-10-CM | POA: Diagnosis not present

## 2023-11-01 LAB — CBC
HCT: 34.9 % — ABNORMAL LOW (ref 36.0–46.0)
Hemoglobin: 10.7 g/dL — ABNORMAL LOW (ref 12.0–15.0)
MCH: 25.1 pg — ABNORMAL LOW (ref 26.0–34.0)
MCHC: 30.7 g/dL (ref 30.0–36.0)
MCV: 81.9 fL (ref 80.0–100.0)
Platelets: 384 10*3/uL (ref 150–400)
RBC: 4.26 MIL/uL (ref 3.87–5.11)
RDW: 14.7 % (ref 11.5–15.5)
WBC: 9.8 10*3/uL (ref 4.0–10.5)
nRBC: 0 % (ref 0.0–0.2)

## 2023-11-01 LAB — COMPREHENSIVE METABOLIC PANEL
ALT: 18 U/L (ref 0–44)
AST: 20 U/L (ref 15–41)
Albumin: 2.7 g/dL — ABNORMAL LOW (ref 3.5–5.0)
Alkaline Phosphatase: 85 U/L (ref 38–126)
Anion gap: 9 (ref 5–15)
BUN: 22 mg/dL — ABNORMAL HIGH (ref 6–20)
CO2: 24 mmol/L (ref 22–32)
Calcium: 9 mg/dL (ref 8.9–10.3)
Chloride: 99 mmol/L (ref 98–111)
Creatinine, Ser: 0.93 mg/dL (ref 0.44–1.00)
GFR, Estimated: >60 mL/min (ref >60)
Glucose, Bld: 318 mg/dL — ABNORMAL HIGH (ref 70–99)
Potassium: 4.1 mmol/L (ref 3.5–5.1)
Sodium: 132 mmol/L — ABNORMAL LOW (ref 135–145)
Total Bilirubin: 0.4 mg/dL (ref 0.0–1.2)
Total Protein: 6.9 g/dL (ref 6.5–8.1)

## 2023-11-01 LAB — ECHOCARDIOGRAM COMPLETE
AR max vel: 1.62 cm2
AV Area VTI: 1.65 cm2
AV Area mean vel: 1.64 cm2
AV Mean grad: 6 mm[Hg]
AV Peak grad: 12.4 mm[Hg]
Ao pk vel: 1.76 m/s
Area-P 1/2: 4.24 cm2
Calc EF: 44.3 %
Height: 67 in
S' Lateral: 5.1 cm
Single Plane A2C EF: 45.8 %
Single Plane A4C EF: 42.5 %
Weight: 4442.24 [oz_av]

## 2023-11-01 LAB — TROPONIN I (HIGH SENSITIVITY)
Troponin I (High Sensitivity): 217 ng/L (ref <18)
Troponin I (High Sensitivity): 219 ng/L (ref <18)

## 2023-11-01 LAB — GLUCOSE, CAPILLARY
Glucose-Capillary: 331 mg/dL — ABNORMAL HIGH (ref 70–99)
Glucose-Capillary: 273 mg/dL — ABNORMAL HIGH (ref 70–99)
Glucose-Capillary: 264 mg/dL — ABNORMAL HIGH (ref 70–99)
Glucose-Capillary: 248 mg/dL — ABNORMAL HIGH (ref 70–99)
Glucose-Capillary: 308 mg/dL — ABNORMAL HIGH (ref 70–99)
Glucose-Capillary: 313 mg/dL — ABNORMAL HIGH (ref 70–99)
Glucose-Capillary: 305 mg/dL — ABNORMAL HIGH (ref 70–99)
Glucose-Capillary: 325 mg/dL — ABNORMAL HIGH (ref 70–99)
Glucose-Capillary: 206 mg/dL — ABNORMAL HIGH (ref 70–99)

## 2023-11-01 LAB — BRAIN NATRIURETIC PEPTIDE: B Natriuretic Peptide: 124.9 pg/mL — ABNORMAL HIGH (ref 0.0–100.0)

## 2023-11-01 LAB — D-DIMER, QUANTITATIVE: D-Dimer, Quant: 0.57 ug{FEU}/mL — ABNORMAL HIGH (ref 0.00–0.50)

## 2023-11-01 LAB — MAGNESIUM: Magnesium: 1.7 mg/dL (ref 1.7–2.4)

## 2023-11-01 MED ORDER — LORAZEPAM 2 MG/ML IJ SOLN
0.2500 mg | Freq: Once | INTRAMUSCULAR | Status: AC
  Administered 2023-11-01: 0.25 mg via INTRAVENOUS
  Filled 2023-11-01: qty 1

## 2023-11-01 MED ORDER — HYDROXYZINE HCL 25 MG PO TABS: 25.0000 mg | ORAL_TABLET | Freq: Once | ORAL | Status: DC

## 2023-11-01 MED ORDER — INSULIN ASPART 100 UNIT/ML IJ SOLN
6.0000 [IU] | Freq: Three times a day (TID) | INTRAMUSCULAR | Status: DC
  Administered 2023-11-01 (×2): 6 [IU] via SUBCUTANEOUS

## 2023-11-01 MED ORDER — ALBUTEROL SULFATE (2.5 MG/3ML) 0.083% IN NEBU: 2.5000 mg | INHALATION_SOLUTION | Freq: Once | RESPIRATORY_TRACT | Status: DC

## 2023-11-01 NOTE — Telephone Encounter (Signed)
Copied from CRM [id number]. Topic: Clinical - Home Health Verbal Orders >> Oct 31, 2023 11:32 AM Phill Myron wrote: Caller/Agency: Santina Evans with [facility]  Callback Number: [phone] Service Requested: Physical Therapy Frequency:  1w2 Any new concerns about the patient? No  Was sent to the wrong office . [facility]

## 2023-11-01 NOTE — Telephone Encounter (Signed)
Please verify if message was send to [facility].Looks like message was send to wrong office as per Renelda Loma , RMA    Copied from CRM [id number]. Topic: Clinical - Home Health Verbal Orders >> Oct 31, 2023 11:32 AM Phill Myron wrote: Caller/Agency: Santina Evans with [facility]  Callback Number: [phone] Service Requested: Physical Therapy Frequency:  1w2 Any new concerns about the patient? No   Was sent to the wrong office . [facility]

## 2023-11-01 NOTE — Progress Notes (Signed)
Echocardiogram 2D Echocardiogram has been performed.  Carolyn Zimmerman 11/01/2023, 11:44 AM

## 2023-11-01 NOTE — Progress Notes (Signed)
   11/01/23 0432  Vitals  Temp 98 F (36.7 C)  BP (!) 173/110  MAP (mmHg) 129  BP Location Left Arm  BP Method Automatic  Patient Position (if appropriate) Lying  Pulse Rate 86  Resp 18   Patient c/o sob at rest, o2 sat 100% on room air. Patient tearful states " I don't want to die alone without my kids" .Patient also c/o chest pain when taking deep breath. EKG done nsr with some prolonged qt interval and ST changes. Jolene Schimke ,NP was notified regarding pt condition, and  is currently at the bedside to assess patient. Orders entered for Troponin lab, and ativan iv, please see MAR. All safety measures are being maintained at this time.

## 2023-11-01 NOTE — Progress Notes (Signed)
    Patient Name: Daily Doe           DOB: 01/20/1986  MRN: [id number]      Admission Date: 10/29/2023  Attending Provider: Leatha Gilding, MD  Primary Diagnosis: Cellulitis of right lower extremity   Level of care: Med-Surg    CROSS COVER NOTE   Date of Service   11/01/2023   Carolyn Zimmerman, 38 y.o. female, was admitted on 10/29/2023 for Cellulitis of right lower extremity.    HPI/Events of Note   Patient complaining of chest tightness/pain.  Discomfort specifically located at mid chest.  Patient endorses a lot of anxiety at being in the hospital without her family.  Hyperventilating and tearful at bedside. Pain is not replicable when palpating chest wall. Hemodynamically stable, breath sounds clear and even, no cough or sputum production.  S1 and S2 heard.  Addendum-  0630- relaxed, discomfort relieved after ativan.   Interventions/ Plan   Ativan EKG-normal sinus rhythm with ST segment abnormalities troponin, BNP- pending        Anthoney Harada, DNP, ACNPC- AG [facility] [place]

## 2023-11-01 NOTE — Progress Notes (Signed)
PROGRESS NOTE  Carolyn Zimmerman RUE:[id number] DOB: [birth year] DOA: 10/29/2023 PCP: Caesar Bookman, NP   LOS: 1 day   Brief Narrative / Interim history: 38 year old female with with history of anxiety, depression, left MCA watershed CVA, HTN, HLD, chronic combined CHF with EF of 40-45%, morbid obesity, IDDM, history of necrotizing fasciitis, history of abdominal wall abscess requiring panniculectomy, comes into the hospital with right lower extremity swelling, erythema, increased pain.  She was recently hospitalized with the same, was discharged home on oral antibiotics, and while initially she felt improvement, once antibiotics were discontinued she experienced recurrence of her symptoms.  Subjective / 24h Interval events: Reported an episode of chest pain this morning, in the middle of her chest.  Felt like she could not breathe.  She tells me that this is happening at home, about once every 6 months.  No apparent triggers  Assesement and Plan: Principal Problem:   Cellulitis of right lower extremity Active Problems:   Uncontrolled diabetes mellitus with hyperglycemia (HCC)   Obesity, Class III, BMI 40-49.9 (morbid obesity) (HCC)   Morbid obesity (HCC)   Mixed hyperlipidemia   Anxiety   Depression   History of CVA (cerebrovascular accident)   Tobacco abuse   Hypertension   Heart failure with mildly reduced ejection fraction (HCC)   Class 3 obesity   Cellulitis  Principal problem RLE cellulitis -patient started on antibiotics, continue.  Lower extremity Dopplers negative for DVT.  She has mild leukocytosis -Improving, continue IV antibiotics for another day  Active problems Poorly controlled HTN, chronic systolic CHF-still hypertensive this morning, continue amlodipine, Coreg, spironolactone, torsemide as well as ARB.  Closely monitor blood pressure.  Overall trend improving, not as hypertensive as she was yesterday  Chest pain-initial troponin in the 200 range, could be demand  given how hypertensive she was, repeat pending.  Check D-dimer.  Obtain echo  Medication nonadherence-tells me she ran out of her medications, upon last discharge did not get these refilled.  Will make sure upon this hospitalization she will get them refilled at the time of discharge  DM2, uncontrolled, with hyperglycemia-continue glargine, sliding scale.  Glargine, mealtime insulin increased  Lab Results  Component Value Date   HGBA1C 11.3 (H) 10/29/2023   CBG (last 3)  Recent Labs    10/31/23 2038 11/01/23 0441 11/01/23 0744  GLUCAP 248* 308* 313*    Obesity, morbid-BMI over 40, she would benefit from weight loss  Prior CVA-supportive care, continue aspirin  Tobacco use-continue nicotine patch  Hyperlipidemia-not taking statin anymore  Scheduled Meds:  amLODipine  10 mg Oral Daily   aspirin  81 mg Oral Daily   carvedilol  25 mg Oral BID WC   enoxaparin (LOVENOX) injection  60 mg Subcutaneous Q24H   insulin aspart  0-20 Units Subcutaneous TID WC   insulin aspart  0-5 Units Subcutaneous QHS   insulin aspart  4 Units Subcutaneous TID WC   insulin glargine-yfgn  30 Units Subcutaneous Daily   irbesartan  300 mg Oral Daily   metFORMIN  1,000 mg Oral BID WC   naproxen  500 mg Oral BID WC   nicotine  14 mg Transdermal Daily   rosuvastatin  20 mg Oral Daily   spironolactone  50 mg Oral Daily   torsemide  40 mg Oral Daily   Continuous Infusions:  cefTRIAXone (ROCEPHIN)  IV 2 g (10/31/23 2143)   PRN Meds:.acetaminophen **OR** acetaminophen, albuterol, hydrALAZINE, ondansetron **OR** ondansetron (ZOFRAN) IV, oxyCODONE, prochlorperazine  Current Outpatient Medications  Medication  Instructions   albuterol (VENTOLIN HFA) 108 (90 Base) MCG/ACT inhaler 2 puffs, Inhalation, Every 4 hours PRN   Aleve 220-1,100 mg, Oral, Daily PRN   amLODipine (NORVASC) 10 mg, Oral, Daily   aspirin 81 mg, Oral, Daily   carvedilol (COREG) 25 mg, Oral, 2 times daily   cefadroxil (DURICEF) 500 mg,  Oral, 2 times daily   diclofenac (VOLTAREN) 75 mg, Oral, 2 times daily   doxycycline (VIBRA-TABS) 100 mg, Oral, 2 times daily   hydrochlorothiazide (MICROZIDE) 25 mg, Oral, Daily   Insulin Glargine Solostar (LANTUS) 25 Units, Subcutaneous, Daily   insulin lispro (HUMALOG) 10 Units, Subcutaneous, 3 times daily   metFORMIN (GLUCOPHAGE-XR) 1,000 mg, Oral, 2 times daily   nicotine (NICODERM CQ - DOSED IN MG/24 HOURS) 14 mg, Transdermal, Daily   rosuvastatin (CRESTOR) 20 mg, Oral, Daily   spironolactone (ALDACTONE) 50 mg, Oral, Daily   torsemide (DEMADEX) 40 mg, Oral, Daily   valsartan (DIOVAN) 320 mg, Oral, Daily    Diet Orders (From admission, onward)     Start     Ordered   10/30/23 1556  Diet heart healthy/carb modified Room service appropriate? Yes; Fluid consistency: Thin  Diet effective now       Question Answer Comment  Diet-HS Snack? Nothing   Room service appropriate? Yes   Fluid consistency: Thin      10/30/23 1556            DVT prophylaxis:    Lab Results  Component Value Date   PLT 384 11/01/2023      Code Status: Full Code  Family Communication: No family at bedside  Status is: Inpatient   Level of care: Med-Surg  Consultants:  None  Objective: Vitals:   10/31/23 2005 11/01/23 0432 11/01/23 0626 11/01/23 0915  BP: (!) 163/91 (!) 173/110 (!) 156/89 (!) 148/77  Pulse: 81 86 80 82  Resp: 20 18  16   Temp: 97.9 F (36.6 C) 98 F (36.7 C)  98.3 F (36.8 C)  TempSrc:    Oral  SpO2: 99% 100% 97% 97%  Weight:      Height:       No intake or output data in the 24 hours ending 11/01/23 1043  Wt Readings from Last 3 Encounters:  10/30/23 125.9 kg  10/29/23 126.1 kg  10/21/23 (!) 140 kg    Examination:  Constitutional: NAD Eyes: lids and conjunctivae normal, no scleral icterus ENMT: mmm Neck: normal, supple Respiratory: clear to auscultation bilaterally, no wheezing, no crackles. Normal respiratory effort.  Cardiovascular: Regular rate and  rhythm, no murmurs / rubs / gallops. No LE edema. Abdomen: soft, no distention, no tenderness. Bowel sounds positive.   Data Reviewed: I have independently reviewed following labs and imaging studies   CBC Recent Labs  Lab 10/29/23 1148 10/29/23 2325 10/31/23 0438 11/01/23 0631  WBC 9.6 10.3 11.0* 9.8  HGB 12.4 10.9* 11.0* 10.7*  HCT 41.8 34.2* 36.8 34.9*  PLT 500* 412* 416* 384  MCV 83.1 79.7* 84.4 81.9  MCH 24.7* 25.4* 25.2* 25.1*  MCHC 29.7* 31.9 29.9* 30.7  RDW 14.5 14.6 14.6 14.7  LYMPHSABS  --  3.9  --   --   MONOABS  --  0.9  --   --   EOSABS 154 0.2  --   --   BASOSABS 67 0.1  --   --     Recent Labs  Lab 10/29/23 1148 10/29/23 2325 10/31/23 0438 11/01/23 0631  NA 132* 126* 133* 132*  K 4.2 3.9 4.0 4.1  CL 96* 92* 99 99  CO2 27 26 22 24   GLUCOSE 459* 544* 292* 318*  BUN 19 18 20  22*  CREATININE 0.92 0.90 1.07* 0.93  CALCIUM 9.3 8.7* 8.8* 9.0  AST 14 11* 17 20  ALT 13 12 18 18   ALKPHOS  --  104 74 85  BILITOT 0.3 0.3 0.4 0.4  ALBUMIN  --  3.3* 2.6* 2.7*  MG  --   --   --  1.7  LATICACIDVEN  --  1.5  --   --   INR  --  0.9  --   --   TSH 1.13  --   --   --   HGBA1C 11.3*  --   --   --   BNP  --   --   --  124.9*    ------------------------------------------------------------------------------------------------------------------ Recent Labs    10/29/23 1148  CHOL 178  HDL 40*  TRIG 417*  CHOLHDL 4.5    Lab Results  Component Value Date   HGBA1C 11.3 (H) 10/29/2023   ------------------------------------------------------------------------------------------------------------------ Recent Labs    10/29/23 1148  TSH 1.13    Cardiac Enzymes No results for input(s): "CKMB", "TROPONINI", "MYOGLOBIN" in the last 168 hours.  Invalid input(s): "CK" ------------------------------------------------------------------------------------------------------------------    Component Value Date/Time   BNP 124.9 (H) 11/01/2023 0631    CBG: Recent  Labs  Lab 10/31/23 1723 10/31/23 1910 10/31/23 2038 11/01/23 0441 11/01/23 0744  GLUCAP 273* 264* 248* 308* 313*    Recent Results (from the past 240 hours)  Blood Culture (routine x 2)     Status: None (Preliminary result)   Collection Time: 10/29/23 11:25 PM   Specimen: BLOOD  Result Value Ref Range Status   Specimen Description   Final    BLOOD LEFT ANTECUBITAL Performed at [facility], [address] [place], [place] [address]    Special Requests   Final    BOTTLES DRAWN AEROBIC AND ANAEROBIC Blood Culture adequate volume Performed at [facility], [address] [place], [place] [address]    Culture   Final    NO GROWTH 2 DAYS Performed at [facility], [address], [place], [place] [address]    Report Status PENDING  Incomplete  Blood Culture (routine x 2)     Status: None (Preliminary result)   Collection Time: 10/29/23 11:30 PM   Specimen: BLOOD  Result Value Ref Range Status   Specimen Description   Final    BLOOD RIGHT ANTECUBITAL Performed at [facility], [address] [place], [place] [address]    Special Requests   Final    BOTTLES DRAWN AEROBIC AND ANAEROBIC Blood Culture adequate volume Performed at [facility], [address], [place], [place] [address]    Culture   Final    NO GROWTH 2 DAYS Performed at [facility], [address] [place], [place] [address]    Report Status PENDING  Incomplete     Radiology Studies: No results found.    Pamella Pert, MD, PhD [facility]  Between 7 am - 7 pm I am available, please contact me via Amion (for emergencies) or Securechat (non urgent messages)  Between 7 pm - 7 am I am not available, please contact night coverage MD/APP via Amion

## 2023-11-01 NOTE — Inpatient Diabetes Management (Signed)
Inpatient Diabetes Program Recommendations  AACE/ADA: New Consensus Statement on Inpatient Glycemic Control  Target Ranges:  Prepandial:   less than 140 mg/dL      Peak postprandial:   less than 180 mg/dL (1-2 hours)      Critically ill patients:  140 - 180 mg/dL    Latest Reference Range & Units 11/01/23 04:41 11/01/23 07:44  Glucose-Capillary 70 - 99 mg/dL 865 (H) 784 (H)    Latest Reference Range & Units 10/31/23 07:43 10/31/23 10:47 10/31/23 12:05 10/31/23 13:04 10/31/23 16:12 10/31/23 17:23 10/31/23 19:10 10/31/23 20:38  Glucose-Capillary 70 - 99 mg/dL 696 (H) 295 (H) 284 (H) 297 (H) 331 (H) 273 (H) 264 (H) 248 (H)    Latest Reference Range & Units 10/29/23 11:48  Hemoglobin A1C <5.7 % of total Hgb 11.3 (H)   Review of Glycemic Control  Diabetes history: DM2 Outpatient Diabetes medications: Lantus 25 units QHS, Humalog 6 units TID with meals, Metformin 1000 mg BID Current orders for Inpatient glycemic control: Semglee 30 units daily, Novolog 0-20 units TID with meals, Novolog 0-5 units at bedtime, Novolog 4 units TID with meals, Metformin XR 1000 mg BID  Inpatient Diabetes Program Recommendations:    Insulin: CBG 313 mg/dl this morning.  Patient received Semglee 25 units on 2/12 and will receive 30 units today.  Please consider increasing meal coverage to Novolog 7 units TID with meals.  Discharge Recommendations: Other recommendations: Dexcom G7 sensors (#132440) Long acting recommendations: Insulin Glargine (LANTUS) Solostar Pen 35 units daily  Short acting recommendations:  Meal + Correction coverage Insulin aspart (NOVOLOG) FlexPen  Sensitive Scale.  6 units with meals plus correction scale Hypoglycemia treatment recommendations: Baqsimi 3mg  Supply/Referral recommendations: Pen needles - standard   Use Adult Diabetes Insulin Treatment Post Discharge order set.   Thanks, Orlando Penner, RN, MSN, CDCES Diabetes Coordinator Inpatient Diabetes Program [phone] (Team  Pager from 8am to 5pm)

## 2023-11-02 ENCOUNTER — Other Ambulatory Visit: Payer: Self-pay

## 2023-11-02 ENCOUNTER — Other Ambulatory Visit (HOSPITAL_COMMUNITY): Payer: Self-pay

## 2023-11-02 ENCOUNTER — Ambulatory Visit: Payer: Self-pay

## 2023-11-02 DIAGNOSIS — L03115 Cellulitis of right lower limb: Secondary | ICD-10-CM | POA: Diagnosis not present

## 2023-11-02 LAB — GLUCOSE, CAPILLARY: Glucose-Capillary: 178 mg/dL — ABNORMAL HIGH (ref 70–99)

## 2023-11-02 MED ORDER — INSULIN LISPRO (1 UNIT DIAL) 100 UNIT/ML (KWIKPEN)
10.0000 [IU] | PEN_INJECTOR | Freq: Three times a day (TID) | SUBCUTANEOUS | 0 refills | Status: DC
  Filled 2023-11-02: qty 15, 50d supply, fill #0

## 2023-11-02 MED ORDER — NICOTINE 14 MG/24HR TD PT24
14.0000 mg | MEDICATED_PATCH | Freq: Every day | TRANSDERMAL | 0 refills | Status: DC | PRN
  Filled 2023-11-02: qty 28, 28d supply, fill #0

## 2023-11-02 MED ORDER — TORSEMIDE 20 MG PO TABS
40.0000 mg | ORAL_TABLET | Freq: Every day | ORAL | 0 refills | Status: DC
Start: 2023-11-02 — End: 2024-05-14
  Filled 2023-11-02: qty 180, 90d supply, fill #0

## 2023-11-02 MED ORDER — SPIRONOLACTONE 50 MG PO TABS
50.0000 mg | ORAL_TABLET | Freq: Every day | ORAL | 0 refills | Status: DC
  Filled 2023-11-02: qty 90, 90d supply, fill #0

## 2023-11-02 MED ORDER — METFORMIN HCL ER 500 MG PO TB24
1000.0000 mg | ORAL_TABLET | Freq: Two times a day (BID) | ORAL | 0 refills | Status: DC
  Filled 2023-11-02: qty 360, 90d supply, fill #0

## 2023-11-02 MED ORDER — ROSUVASTATIN CALCIUM 20 MG PO TABS
20.0000 mg | ORAL_TABLET | Freq: Every day | ORAL | 0 refills | Status: DC
  Filled 2023-11-02: qty 90, 90d supply, fill #0

## 2023-11-02 MED ORDER — ALBUTEROL SULFATE HFA 108 (90 BASE) MCG/ACT IN AERS
2.0000 | INHALATION_SPRAY | RESPIRATORY_TRACT | 0 refills | Status: AC | PRN
  Filled 2023-11-02: qty 18, 20d supply, fill #0

## 2023-11-02 MED ORDER — INSULIN GLARGINE SOLOSTAR 100 UNIT/ML ~~LOC~~ SOPN
25.0000 [IU] | PEN_INJECTOR | Freq: Every day | SUBCUTANEOUS | 0 refills | Status: DC
  Filled 2023-11-02: qty 15, 60d supply, fill #0

## 2023-11-02 MED ORDER — AMLODIPINE BESYLATE 10 MG PO TABS
10.0000 mg | ORAL_TABLET | Freq: Every day | ORAL | 0 refills | Status: DC
Start: 2023-11-02 — End: 2024-07-22
  Filled 2023-11-02: qty 90, 90d supply, fill #0

## 2023-11-02 MED ORDER — HYDROCHLOROTHIAZIDE 25 MG PO TABS
25.0000 mg | ORAL_TABLET | Freq: Every day | ORAL | 0 refills | Status: DC
  Filled 2023-11-02: qty 90, 90d supply, fill #0

## 2023-11-02 MED ORDER — INSULIN PEN NEEDLE 31G X 6 MM MISC
0 refills | Status: DC
  Filled 2023-11-02: qty 100, 30d supply, fill #0

## 2023-11-02 MED ORDER — VALSARTAN 320 MG PO TABS
320.0000 mg | ORAL_TABLET | Freq: Every day | ORAL | 0 refills | Status: DC
  Filled 2023-11-02: qty 90, 90d supply, fill #0

## 2023-11-02 MED ORDER — DOXYCYCLINE HYCLATE 100 MG PO CAPS
100.0000 mg | ORAL_CAPSULE | Freq: Two times a day (BID) | ORAL | 0 refills | Status: AC
  Filled 2023-11-02: qty 10, 5d supply, fill #0

## 2023-11-02 MED ORDER — CARVEDILOL 25 MG PO TABS
25.0000 mg | ORAL_TABLET | Freq: Two times a day (BID) | ORAL | 0 refills | Status: DC
  Filled 2023-11-02: qty 180, 90d supply, fill #0

## 2023-11-02 NOTE — Patient Outreach (Signed)
  Care Coordination   Initial Visit Note   11/02/2023 Name: Carolyn Zimmerman MRN: [id number] DOB: 01-Aug-1986  Carolyn Zimmerman is a 38 y.o. year old female who sees Ngetich, [name], NP for primary care. I spoke with  Carolyn Zimmerman by phone today.  What matters to the patients health and wellness today?  Patient needs help with bills.  Patient has  no funds for February bills. Patient has been in the hospital for an extended period of time since Oct 08, 2023. Patient will need to find employment that she can be seated due to infection in her leg and she is using a walker. Patients mother helps and could possibly move in together to share bills. Patient will apply for DSS Work First, DSS Emergency Assistance.   Goals Addressed             This Visit's Progress    Care Coordination Activities       Interventions Today    Flowsheet Row Most Recent Value  Chronic Disease   Chronic disease during today's visit Diabetes, Hypertension (HTN)  General Interventions   General Interventions Discussed/Reviewed General Interventions Discussed, General Interventions Reviewed, [employer]  [Pt unemployed due to hospitalization.Pt looking for work.Pt needs help w/$1469 rent & light bill for February.Income tax payment delayed.SW suggests DSS,leasing office to get larger un & deferment it(mother move in).]              SDOH assessments and interventions completed:  Yes  SDOH Interventions Today    Flowsheet Row Most Recent Value  SDOH Interventions   Food Insecurity Interventions Intervention Not Indicated, Other (Comment)  [Receives Foodstamps]  Housing Interventions Intervention Not Indicated, Community Resources Provided  Transportation Interventions Intervention Not Indicated  Utilities Interventions Intervention Not Indicated, Community Resources Provided        Care Coordination Interventions:  Yes, provided   Follow up plan: Follow up call scheduled for 11/13/23 at 11:30am.     Encounter Outcome:  Patient Visit Completed

## 2023-11-02 NOTE — Plan of Care (Signed)

## 2023-11-02 NOTE — Patient Outreach (Signed)
  Care Management  Transitions of Care Program Transitions of Care Post-discharge - Case closed to [facility] Program  11/02/2023 Name: Carolyn Zimmerman MRN: [id number] DOB: [birth year]  Subjective: Carolyn Zimmerman is a 38 y.o. year old female who is a primary care patient of Ngetich, [name], NP. The Care Management team was unable to reach the patient by phone to assess and address transitions of care needs as she has been readmitted to the hospital.     Plan:  [facility] program closed due to patient has been readmitted to the hospital.     Wyline Mood BSN, RN RN Care Manager   Transitions of Care  [place] [facility], [facility] Direct Dial Number: [phone]  Fax: [phone]

## 2023-11-02 NOTE — TOC Transition Note (Signed)
Transition of Care [facility]) - Discharge Note   Patient Details  Name: Axel Meas MRN: [id number] Date of Birth: 30-Oct-1985  [facility] CM/SW Contact:  Adrian Prows, RN Phone Number: 11/02/2023, 11:11 AM   Clinical Narrative:    D/C orders received; no TOC needs.   Final next level of care: Home/Self Care Barriers to Discharge: No Barriers Identified   Patient Goals and CMS Choice Patient states their goals for this hospitalization and ongoing recovery are:: Home CMS Medicare.gov Compare Post Acute Care list provided to:: Patient Choice offered to / list presented to : Patient [place] ownership interest in [facility].provided to:: Patient    Discharge Placement                       Discharge Plan and Services Additional resources added to the After Visit Summary for                                       Social Drivers of Health (SDOH) Interventions SDOH Screenings   Food Insecurity: Food Insecurity Present (10/30/2023)  Housing: High Risk (10/30/2023)  Transportation Needs: No Transportation Needs (10/30/2023)  Utilities: Not At Risk (10/30/2023)  Alcohol Screen: Low Risk  (10/29/2023)  Depression (PHQ2-9): Low Risk  (05/17/2023)  Financial Resource Strain: High Risk (10/29/2023)  Physical Activity: Unknown (10/29/2023)  Social Connections: Socially Isolated (10/29/2023)  Stress: Stress Concern Present (10/29/2023)  Tobacco Use: High Risk (10/30/2023)     Readmission Risk Interventions     No data to display

## 2023-11-02 NOTE — Patient Instructions (Signed)
Visit Information  Thank you for taking time to visit with me today. Please don't hesitate to contact me if I can be of assistance to you.   Following are the goals we discussed today:  Patient to connect with community resources. Patient to speak to mother about moving in together. Patient to speak to leasing office for payment arrangements and larger unit.   Our next appointment is by telephone on 11/13/23 at 11:30am  Please call the care guide team at [phone] if you need to cancel or reschedule your appointment.   If you are experiencing a Mental Health or Behavioral Health Crisis or need someone to talk to, please call 911  Patient verbalizes understanding of instructions and care plan provided today and agrees to view in MyChart. Active MyChart status and patient understanding of how to access instructions and care plan via MyChart confirmed with patient.     Telephone follow up appointment with care management team member scheduled for:11/13/23 at 11:30am   Lysle Morales, BSW [place]  [facility], [facility] Social Worker Direct Dial: [phone]  Fax: [phone] Website: [email or web address]

## 2023-11-02 NOTE — Discharge Summary (Signed)
Physician Discharge Summary  Carolyn Zimmerman WGN:[id number] DOB: [birth year] DOA: 10/29/2023  PCP: Caesar Bookman, NP  Admit date: 10/29/2023 Discharge date: 11/02/2023  Admitted From: home Disposition:  home  Recommendations for Outpatient Follow-up:  Follow up with PCP in 1-2 weeks  Home Health: none Equipment/Devices: none  Discharge Condition: stable CODE STATUS: Full code Diet Orders (From admission, onward)     Start     Ordered   10/30/23 1556  Diet heart healthy/carb modified Room service appropriate? Yes; Fluid consistency: Thin  Diet effective now       Question Answer Comment  Diet-HS Snack? Nothing   Room service appropriate? Yes   Fluid consistency: Thin      10/30/23 1556            HPI: Per admitting MD, Carolyn Zimmerman is a 38 y.o. female with medical history significant of anxiety, depression, coronavirus infection, left MCA watershed CVA, hyperlipidemia, hypertension, hypertensive heart disease, chronic combined systolic heart failure with EF of 40 to 45% in June 2024, jaundice, class III obesity, tobacco use, history of acute pancreatitis, insulin-dependent type 2 diabetes,  tubo-ovarian abscess, hidradenitis suppurativa, chest wall abscesses, history of sepsis secondary to necrotizing fasciitis due to panniculitis with abdominal wall abscess requiring panniculectomy who presented to emergency department with reexacerbation of her RLE cellulitis with erythema, edema, calor and TTP.  She finished her oral antibiotics after discharge.  She went to see her primary who ordered additional antibiotics, but the patient was concerned given the sepsis picture that she had on her first hospitalization so she went to the emergency department.  Positive fever, fatigue and malaise.  No chills or night sweats. No sore throat, rhinorrhea, dyspnea, wheezing or hemoptysis.  No chest pain, palpitations, diaphoresis, PND, orthopnea or pitting edema of the lower extremities.  No  abdominal pain, diarrhea, constipation, melena or hematochezia.  No flank pain, dysuria, frequency or hematuria.  No polyuria, polydipsia, polyphagia or blurred vision.   Hospital Course / Discharge diagnoses: Principal Problem:   Cellulitis of right lower extremity Active Problems:   Uncontrolled diabetes mellitus with hyperglycemia (HCC)   Obesity, Class III, BMI 40-49.9 (morbid obesity) (HCC)   Morbid obesity (HCC)   Mixed hyperlipidemia   Anxiety   Depression   History of CVA (cerebrovascular accident)   Tobacco abuse   Hypertension   Heart failure with mildly reduced ejection fraction (HCC)   Class 3 obesity   Cellulitis   Principal problem RLE cellulitis -patient was admitted to the hospital with right lower extremity cellulitis.  Lower extremity Dopplers were negative for DVT.  She was placed on antibiotics with improvement in her cellulitis, leukocytosis resolved, swelling and redness resolved as well.  With improvement, she will be transition to oral agents and discharged home in stable condition with several additional days.  Active problems Poorly controlled HTN, chronic systolic CHF-she was have poorly controlled hypertension after admission in the setting of medication noncompliance, she was not taking her home prescriptions as she ran out of her home meds.  She was placed back on her regimen with improvement in her blood pressure.  On discharge, she will be given 3 months worth of her home regimen.  Chest pain-patient had an episode of chest pain while hospitalized, and she mentions that she gets these every 6 months or so. Troponins were flat, in a pattern more consistent with demand ischemia, likely induced by poorly controlled HTN. 2D echo without significant changes when compared to prior studies.  Medication nonadherence-tells me she ran out of her medications, upon last discharge did not get these refilled.  Prescribed 3 months worth of home meds DM2, uncontrolled,  with hyperglycemia-continue home medications. Refilled   Obesity, morbid-BMI over 40, she would benefit from weight loss Prior CVA-supportive care, continue aspirin Tobacco use-continue nicotine patch Hyperlipidemia-not taking statin anymore  Sepsis ruled out   Discharge Instructions   Allergies as of 11/02/2023       Reactions   Contrast Media [iodinated Contrast Media] Hives, Swelling        Medication List     STOP taking these medications    diclofenac 75 MG EC tablet Commonly known as: VOLTAREN   doxycycline 100 MG tablet Commonly known as: VIBRA-TABS Replaced by: doxycycline 100 MG capsule       TAKE these medications    albuterol 108 (90 Base) MCG/ACT inhaler Commonly known as: VENTOLIN HFA Inhale 2 puffs into the lungs every 4 (four) hours as needed for wheezing or shortness of breath.   Aleve 220 MG tablet Generic drug: naproxen sodium Take 220-1,100 mg by mouth daily as needed (for headaches or mild pain).   amLODipine 10 MG tablet Commonly known as: NORVASC Take 1 tablet (10 mg total) by mouth daily.   aspirin 81 MG chewable tablet Chew 1 tablet (81 mg total) by mouth daily.   carvedilol 25 MG tablet Commonly known as: COREG Take 1 tablet (25 mg total) by mouth 2 (two) times daily.   doxycycline 100 MG capsule Commonly known as: VIBRAMYCIN Take 1 capsule (100 mg total) by mouth 2 (two) times daily for 5 days. Replaces: doxycycline 100 MG tablet   hydrochlorothiazide 12.5 MG capsule Commonly known as: MICROZIDE Take 2 capsules (25 mg total) by mouth daily.   Insulin Glargine Solostar 100 UNIT/ML Solostar Pen Commonly known as: LANTUS Inject 25 Units into the skin daily.   insulin lispro 100 UNIT/ML KwikPen Commonly known as: HUMALOG Inject 10 Units into the skin 3 (three) times daily.   metFORMIN 500 MG 24 hr tablet Commonly known as: GLUCOPHAGE-XR Take 2 tablets (1,000 mg total) by mouth in the morning and at bedtime.   nicotine  14 mg/24hr patch Commonly known as: NICODERM CQ - dosed in mg/24 hours Place 1 patch (14 mg total) onto the skin daily as needed (smoking censations).   rosuvastatin 20 MG tablet Commonly known as: CRESTOR Take 1 tablet (20 mg total) by mouth daily.   spironolactone 50 MG tablet Commonly known as: ALDACTONE Take 1 tablet (50 mg total) by mouth daily.   torsemide 20 MG tablet Commonly known as: DEMADEX Take 2 tablets (40 mg total) by mouth daily.   valsartan 320 MG tablet Commonly known as: DIOVAN Take 1 tablet (320 mg total) by mouth daily.        Follow-up Information     Ngetich, [name], NP In 2 days.   Specialty: Family Medicine Contact information: [address] [place] [address] [phone]                 Consultations: none  Procedures/Studies:  ECHOCARDIOGRAM COMPLETE Result Date: 11/01/2023    ECHOCARDIOGRAM REPORT   Patient Name:   EDNA REDE Date of Exam: 11/01/2023 Medical Rec #:  [id number]    Height:       67.0 in Accession #:    [id number]   Weight:       277.6 lb Date of Birth:  09/28/1985    BSA:  2.325 m Patient Age:    37 years     BP:           148/77 mmHg Patient Gender: F            HR:           83 bpm. Exam Location:  Inpatient Procedure: 2D Echo, 3D Echo, Cardiac Doppler, Color Doppler and Strain Analysis            (Both Spectral and Color Flow Doppler were utilized during            procedure). Indications:    Chest Pain  History:        Patient has prior history of Echocardiogram examinations, most                 recent 03/09/2023. Risk Factors:Hypertension, Diabetes,                 Dyslipidemia and Former Smoker.  Sonographer:    Karma Ganja Referring Phys: [id number] [facility]  Sonographer Comments: Global longitudinal strain was attempted. IMPRESSIONS  1. Left ventricular ejection fraction, by estimation, is 40 to 45%. Left ventricular ejection fraction by 3D volume is 40 %. The left ventricle has mildly decreased function.  The left ventricle demonstrates global hypokinesis. The left ventricular internal cavity size was mildly to moderately dilated. There is moderate left ventricular hypertrophy. Left ventricular diastolic parameters are consistent with Grade I diastolic dysfunction (impaired relaxation). The average left ventricular global longitudinal strain is -3.2 %. The global longitudinal strain is abnormal.  2. Right ventricular systolic function is normal. The right ventricular size is normal. There is normal pulmonary artery systolic pressure.  3. Left atrial size was mildly dilated.  4. A small pericardial effusion is present.  5. The mitral valve is normal in structure. No evidence of mitral valve regurgitation. No evidence of mitral stenosis.  6. The aortic valve is normal in structure. Aortic valve regurgitation is not visualized. No aortic stenosis is present.  7. The inferior vena cava is normal in size with greater than 50% respiratory variability, suggesting right atrial pressure of 3 mmHg. Comparison(s): No significant change from prior study. FINDINGS  Left Ventricle: Left ventricular ejection fraction, by estimation, is 40 to 45%. Left ventricular ejection fraction by 3D volume is 40 %. The left ventricle has mildly decreased function. The left ventricle demonstrates global hypokinesis. The average left ventricular global longitudinal strain is -3.2 %. Strain was performed and the global longitudinal strain is abnormal. The left ventricular internal cavity size was mildly to moderately dilated. There is moderate left ventricular hypertrophy. Left ventricular diastolic parameters are consistent with Grade I diastolic dysfunction (impaired relaxation). Right Ventricle: The right ventricular size is normal. No increase in right ventricular wall thickness. Right ventricular systolic function is normal. There is normal pulmonary artery systolic pressure. The tricuspid regurgitant velocity is 1.44 m/s, and  with an  assumed right atrial pressure of 3 mmHg, the estimated right ventricular systolic pressure is 11.3 mmHg. Left Atrium: Left atrial size was mildly dilated. Right Atrium: Right atrial size was normal in size. Pericardium: A small pericardial effusion is present. Mitral Valve: The mitral valve is normal in structure. No evidence of mitral valve regurgitation. No evidence of mitral valve stenosis. Tricuspid Valve: The tricuspid valve is normal in structure. Tricuspid valve regurgitation is not demonstrated. No evidence of tricuspid stenosis. Aortic Valve: The aortic valve is normal in structure. Aortic valve regurgitation is not visualized. No  aortic stenosis is present. Aortic valve mean gradient measures 6.0 mmHg. Aortic valve peak gradient measures 12.4 mmHg. Aortic valve area, by VTI measures 1.65 cm. Pulmonic Valve: The pulmonic valve was normal in structure. Pulmonic valve regurgitation is not visualized. No evidence of pulmonic stenosis. Aorta: The aortic root is normal in size and structure. Venous: The inferior vena cava is normal in size with greater than 50% respiratory variability, suggesting right atrial pressure of 3 mmHg. IAS/Shunts: No atrial level shunt detected by color flow Doppler. Additional Comments: 3D was performed not requiring image post processing on an independent workstation and was abnormal.  LEFT VENTRICLE PLAX 2D LVIDd:         6.20 cm         Diastology LVIDs:         5.10 cm         LV e' medial:    4.46 cm/s LV PW:         1.50 cm         LV E/e' medial:  14.3 LV IVS:        1.70 cm         LV e' lateral:   3.92 cm/s LVOT diam:     2.00 cm         LV E/e' lateral: 16.3 LV SV:         44 LV SV Index:   19              2D LVOT Area:     3.14 cm        Longitudinal                                Strain                                2D Strain GLS  -3.5 % LV Volumes (MOD)               (A2C): LV vol d, MOD    108.0 ml      2D Strain GLS  -3.3 % A2C:                           (A3C): LV  vol d, MOD    110.0 ml      2D Strain GLS  -2.7 % A4C:                           (A4C): LV vol s, MOD    58.5 ml       2D Strain GLS  -3.2 % A2C:                           Avg: LV vol s, MOD    63.3 ml A4C:                           3D Volume EF LV SV MOD A2C:   49.5 ml       LV 3D EF:    Left LV SV MOD A4C:   110.0 ml                   ventricul LV SV MOD BP:  49.3 ml                    ar                                             ejection                                             fraction                                             by 3D                                             volume is                                             40 %.                                 3D Volume EF:                                3D EF:        40 %                                LV EDV:       226 ml                                LV ESV:       136 ml                                LV SV:        91 ml RIGHT VENTRICLE             IVC RV Basal diam:  3.10 cm     IVC diam: 1.40 cm RV S prime:     21.20 cm/s TAPSE (M-mode): 2.7 cm LEFT ATRIUM             Index        RIGHT ATRIUM           Index LA diam:        4.70 cm 2.02 cm/m   RA Area:     14.00 cm LA Vol (A2C):   89.1 ml 38.33 ml/m  RA Volume:   26.20 ml  11.27 ml/m LA Vol (A4C):   87.5 ml 37.64 ml/m LA Biplane Vol: 91.8 ml 39.49 ml/m  AORTIC VALVE AV Area (Vmax):    1.62 cm AV Area (Vmean):  1.64 cm AV Area (VTI):     1.65 cm AV Vmax:           176.00 cm/s AV Vmean:          106.000 cm/s AV VTI:            0.266 m AV Peak Grad:      12.4 mmHg AV Mean Grad:      6.0 mmHg LVOT Vmax:         90.80 cm/s LVOT Vmean:        55.300 cm/s LVOT VTI:          0.140 m LVOT/AV VTI ratio: 0.53  AORTA Ao Root diam: 3.20 cm Ao Asc diam:  2.60 cm MITRAL VALVE               TRICUSPID VALVE MV Area (PHT): 4.24 cm    TR Peak grad:   8.3 mmHg MV Decel Time: 179 msec    TR Vmax:        144.00 cm/s MV E velocity: 63.80 cm/s MV A velocity: 58.70 cm/s  SHUNTS MV E/A ratio:  1.09         Systemic VTI:  0.14 m                            Systemic Diam: 2.00 cm Clearnce Hasten Electronically signed by Clearnce Hasten Signature Date/Time: 11/01/2023/12:29:05 PM    Final    VAS [place] LOWER EXTREMITY VENOUS (DVT) Result Date: 10/31/2023  Lower Venous DVT Study Patient Name:  ALONAH LINEBACK  Date of Exam:   10/31/2023 Medical Rec #: [id number]     Accession #:    [id number] Date of Birth: [birth year]     Patient Gender: F Patient Age:   78 years Exam [facility]  [facility] Procedure:      VAS [place] LOWER EXTREMITY VENOUS (DVT) Referring Phys: DAVID ORTIZ --------------------------------------------------------------------------------  Indications: Swelling.  Risk Factors: None identified. Limitations: Body habitus, poor ultrasound/tissue interface and patient pain tolerance. Comparison Study: No prior studies. Performing Technologist: Chanda Busing RVT  Examination Guidelines: A complete evaluation includes B-mode imaging, spectral Doppler, color Doppler, and power Doppler as needed of all accessible portions of each vessel. Bilateral testing is considered an integral part of a complete examination. Limited examinations for reoccurring indications may be performed as noted. The reflux portion of the exam is performed with the patient in reverse Trendelenburg.  +---------+---------------+---------+-----------+----------+--------------+ RIGHT    CompressibilityPhasicitySpontaneityPropertiesThrombus Aging +---------+---------------+---------+-----------+----------+--------------+ CFV      Full           Yes      Yes                                 +---------+---------------+---------+-----------+----------+--------------+ SFJ      Full                                                        +---------+---------------+---------+-----------+----------+--------------+ FV Prox  Full                                                         +---------+---------------+---------+-----------+----------+--------------+  FV Mid   Full                                                        +---------+---------------+---------+-----------+----------+--------------+ FV DistalFull                                                        +---------+---------------+---------+-----------+----------+--------------+ PFV      Full                                                        +---------+---------------+---------+-----------+----------+--------------+ POP      Full           Yes      Yes                                 +---------+---------------+---------+-----------+----------+--------------+ PTV      Full                                                        +---------+---------------+---------+-----------+----------+--------------+ PERO     Full                                                        +---------+---------------+---------+-----------+----------+--------------+   +----+---------------+---------+-----------+----------+--------------+ LEFTCompressibilityPhasicitySpontaneityPropertiesThrombus Aging +----+---------------+---------+-----------+----------+--------------+ CFV Full           Yes      Yes                                 +----+---------------+---------+-----------+----------+--------------+     Summary: RIGHT: - There is no evidence of deep vein thrombosis in the lower extremity. However, portions of this examination were limited- see technologist comments above.  - No cystic structure found in the popliteal fossa.  LEFT: - No evidence of common femoral vein obstruction.   *See table(s) above for measurements and observations. Electronically signed by Gerarda Fraction on 10/31/2023 at 4:05:31 PM.    Final    DG Chest Port 1 View Result Date: 10/30/2023 CLINICAL DATA:  Right leg pain, redness and swelling. EXAM: PORTABLE CHEST 1 VIEW COMPARISON:  October 11, 2023 FINDINGS: There is  stable mild to moderate severity cardiac silhouette enlargement. Mild, stable lingular linear scarring and/or atelectasis is seen. There is no evidence of an acute infiltrate, pleural effusion or pneumothorax. The visualized skeletal structures are unremarkable. IMPRESSION: Stable cardiomegaly with mild, stable lingular linear scarring and/or atelectasis. Electronically Signed   By: Aram Candela M.D.   On: 10/30/2023 00:36   [place] RT LOWER EXTREM LTD SOFT TISSUE NON VASCULAR  Result Date: 10/17/2023 CLINICAL DATA:  Cellulitis and abscess of right leg. EXAM: ULTRASOUND RIGHT LOWER EXTREMITY LIMITED TECHNIQUE: Ultrasound examination of the lower extremity soft tissues was performed in the area of clinical concern. COMPARISON:  CT 10/11/2023 FINDINGS: Targeted sonographic evaluation in the area of clinical concern labeled right calf. Subcutaneous edema without focal fluid collection. No gross hyperemia. IMPRESSION: Subcutaneous edema without focal fluid collection. Electronically Signed   By: Narda Rutherford M.D.   On: 10/17/2023 18:41   VAS [place] LOWER EXTREMITY VENOUS (DVT) Result Date: 10/14/2023  Lower Venous DVT Study Patient Name:  KAHLI MAYON  Date of Exam:   10/13/2023 Medical Rec #: [id number]     Accession #:    [id number] Date of Birth: Jul 18, 1986     Patient Gender: F Patient Age:   72 years Exam Location:  [facility] Procedure:      VAS [place] LOWER EXTREMITY VENOUS (DVT) Referring Phys: PROSPER AMPONSAH --------------------------------------------------------------------------------  Indications: Pain.  Risk Factors: None identified. Limitations: Body habitus, poor ultrasound/tissue interface and patient pain tolerance. Comparison Study: No prior studies. Performing Technologist: Chanda Busing RVT  Examination Guidelines: A complete evaluation includes B-mode imaging, spectral Doppler, color Doppler, and power Doppler as needed of all accessible portions of each vessel. Bilateral testing is  considered an integral part of a complete examination. Limited examinations for reoccurring indications may be performed as noted. The reflux portion of the exam is performed with the patient in reverse Trendelenburg.  +---------+---------------+---------+-----------+----------+-------------------+ RIGHT    CompressibilityPhasicitySpontaneityPropertiesThrombus Aging      +---------+---------------+---------+-----------+----------+-------------------+ CFV      Full           Yes      Yes                                      +---------+---------------+---------+-----------+----------+-------------------+ SFJ      Full                                                             +---------+---------------+---------+-----------+----------+-------------------+ FV Prox  Full                                                             +---------+---------------+---------+-----------+----------+-------------------+ FV Mid   Full                                                             +---------+---------------+---------+-----------+----------+-------------------+ FV DistalFull                                                             +---------+---------------+---------+-----------+----------+-------------------+ PFV      Full                                                             +---------+---------------+---------+-----------+----------+-------------------+  POP      Full           Yes      Yes                                      +---------+---------------+---------+-----------+----------+-------------------+ PTV                                                   Not well visualized +---------+---------------+---------+-----------+----------+-------------------+ PERO                                                  Not well visualized +---------+---------------+---------+-----------+----------+-------------------+    +----+---------------+---------+-----------+----------+--------------+ LEFTCompressibilityPhasicitySpontaneityPropertiesThrombus Aging +----+---------------+---------+-----------+----------+--------------+ CFV Full           Yes      Yes                                 +----+---------------+---------+-----------+----------+--------------+     Summary: RIGHT: - There is no evidence of deep vein thrombosis in the lower extremity. However, portions of this examination were limited- see technologist comments above.  - No cystic structure found in the popliteal fossa.  LEFT: - No evidence of common femoral vein obstruction.   *See table(s) above for measurements and observations. Electronically signed by Carolynn Sayers on 10/14/2023 at 9:11:38 AM.    Final    CT TIBIA FIBULA RIGHT WO CONTRAST Result Date: 10/12/2023 CLINICAL DATA:  Necrotizing fasciitis suspected. Foot x-ray done. History of nec fasc. Leukocytosis. EXAM: CT OF THE LOWER RIGHT EXTREMITY WITHOUT CONTRAST TECHNIQUE: Multidetector CT imaging of the right lower extremity was performed according to the standard protocol. RADIATION DOSE REDUCTION: This exam was performed according to the departmental dose-optimization program which includes automated exposure control, adjustment of the mA and/or kV according to patient size and/or use of iterative reconstruction technique. COMPARISON:  Same day radiographs FINDINGS: Bones/Joint/Cartilage No acute fracture or dislocation. No knee joint effusion. No evidence of osteomyelitis. Ligaments Suboptimally assessed by CT. Muscles and Tendons No evidence of deep tissue infection. Soft tissues Bilateral inguinal lymphadenopathy. 1.9 cm left inguinal lymph node on series 5/image 57 and 2.0 cm right inguinal node on 5/63. There is perilymphatic fat stranding about the right inguinal lymph nodes. Additional enlarged right external iliac node (series 5/image 42) measuring 13 mm. Subcutaneous edema about the  right lower extremity greatest in the calf. No soft tissue gas. No abscess. IMPRESSION: 1. Subcutaneous edema about the right lower extremity greatest in the calf is nonspecific but can be seen with cellulitis. No soft tissue gas. No abscess. 2. Bilateral inguinal lymphadenopathy with perilymphatic fat stranding about the right inguinal lymph nodes. Additional enlarged right external iliac node. These are likely reactive. Electronically Signed   By: Minerva Fester M.D.   On: 10/12/2023 00:12   CT FEMUR RIGHT WO CONTRAST Result Date: 10/12/2023 CLINICAL DATA:  Necrotizing fasciitis suspected. Foot x-ray done. History of nec fasc. Leukocytosis. EXAM: CT OF THE LOWER RIGHT EXTREMITY WITHOUT CONTRAST TECHNIQUE: Multidetector CT imaging of the right lower extremity was performed according to the standard protocol.  RADIATION DOSE REDUCTION: This exam was performed according to the departmental dose-optimization program which includes automated exposure control, adjustment of the mA and/or kV according to patient size and/or use of iterative reconstruction technique. COMPARISON:  Same day radiographs FINDINGS: Bones/Joint/Cartilage No acute fracture or dislocation. No knee joint effusion. No evidence of osteomyelitis. Ligaments Suboptimally assessed by CT. Muscles and Tendons No evidence of deep tissue infection. Soft tissues Bilateral inguinal lymphadenopathy. 1.9 cm left inguinal lymph node on series 5/image 57 and 2.0 cm right inguinal node on 5/63. There is perilymphatic fat stranding about the right inguinal lymph nodes. Additional enlarged right external iliac node (series 5/image 42) measuring 13 mm. Subcutaneous edema about the right lower extremity greatest in the calf. No soft tissue gas. No abscess. IMPRESSION: 1. Subcutaneous edema about the right lower extremity greatest in the calf is nonspecific but can be seen with cellulitis. No soft tissue gas. No abscess. 2. Bilateral inguinal lymphadenopathy with  perilymphatic fat stranding about the right inguinal lymph nodes. Additional enlarged right external iliac node. These are likely reactive. Electronically Signed   By: Minerva Fester M.D.   On: 10/12/2023 00:12   DG Tibia/Fibula Right Result Date: 10/11/2023 CLINICAL DATA:  Leg pain EXAM: RIGHT TIBIA AND FIBULA - 2 VIEW COMPARISON:  None Available. FINDINGS: No fracture or malalignment. Extensive subcutaneous edema. No osseous erosive change IMPRESSION: No acute osseous abnormality Electronically Signed   By: Jasmine Pang M.D.   On: 10/11/2023 23:00   DG Femur Min 2 Views Right Result Date: 10/11/2023 CLINICAL DATA:  Leg pain fever EXAM: RIGHT FEMUR 2 VIEWS COMPARISON:  None Available. FINDINGS: There is no evidence of fracture or other focal bone lesions. Soft tissues are unremarkable. IMPRESSION: Negative. Electronically Signed   By: Jasmine Pang M.D.   On: 10/11/2023 22:59   DG Chest 2 View Result Date: 10/11/2023 CLINICAL DATA:  Fever and flu-like symptoms. EXAM: CHEST - 2 VIEW COMPARISON:  04/28/2023 FINDINGS: Stable cardiomegaly. Pulmonary vascular congestion. Left mid lung atelectasis/scarring. The lungs are otherwise clear. No pleural effusion or pneumothorax. IMPRESSION: Cardiomegaly and pulmonary vascular congestion. Electronically Signed   By: Minerva Fester M.D.   On: 10/11/2023 20:17     Subjective: - no chest pain, shortness of breath, no abdominal pain, nausea or vomiting.   Discharge Exam: BP (!) 147/89 (BP Location: Right Arm)   Pulse 90   Temp 98.6 F (37 C)   Resp 17   Ht 5\' 7"  (1.702 m)   Wt 125.9 kg   LMP 10/04/2023   SpO2 97%   BMI 43.48 kg/m   General: Pt is alert, awake, not in acute distress Cardiovascular: RRR, S1/S2 +, no rubs, no gallops Respiratory: CTA bilaterally, no wheezing, no rhonchi Abdominal: Soft, NT, ND, bowel sounds + Extremities: no edema, no cyanosis    The results of significant diagnostics from this hospitalization (including  imaging, microbiology, ancillary and laboratory) are listed below for reference.     Microbiology: Recent Results (from the past 240 hours)  Blood Culture (routine x 2)     Status: None (Preliminary result)   Collection Time: 10/29/23 11:25 PM   Specimen: BLOOD  Result Value Ref Range Status   Specimen Description   Final    BLOOD LEFT ANTECUBITAL Performed at [facility], [address] [place], [place] [address]    Special Requests   Final    BOTTLES DRAWN AEROBIC AND ANAEROBIC Blood Culture adequate volume Performed at [facility], [phone]  [address], [place], [place] [address]    Culture   Final    NO GROWTH 3 DAYS Performed at [facility], [address], [place], [place] [address]    Report Status PENDING  Incomplete  Blood Culture (routine x 2)     Status: None (Preliminary result)   Collection Time: 10/29/23 11:30 PM   Specimen: BLOOD  Result Value Ref Range Status   Specimen Description   Final    BLOOD RIGHT ANTECUBITAL Performed at [facility], [address] [place], [place] [address]    Special Requests   Final    BOTTLES DRAWN AEROBIC AND ANAEROBIC Blood Culture adequate volume Performed at [facility], [address] [place], [place] [address]    Culture   Final    NO GROWTH 3 DAYS Performed at [facility], [address] [place], [place] [address]    Report Status PENDING  Incomplete     Labs: Basic Metabolic Panel: Recent Labs  Lab 10/29/23 1148 10/29/23 2325 10/31/23 0438 11/01/23 0631  NA 132* 126* 133* 132*  K 4.2 3.9 4.0 4.1  CL 96* 92* 99 99  CO2 27 26 22 24   GLUCOSE 459* 544* 292* 318*  BUN 19 18 20  22*  CREATININE 0.92 0.90 1.07* 0.93  CALCIUM 9.3 8.7* 8.8* 9.0  MG  --   --   --  1.7   Liver Function Tests: Recent Labs  Lab 10/29/23 1148 10/29/23 2325 10/31/23 0438 11/01/23 0631  AST 14 11* 17 20  ALT 13 12 18 18   ALKPHOS  --   104 74 85  BILITOT 0.3 0.3 0.4 0.4  PROT 7.4 7.2 7.0 6.9  ALBUMIN  --  3.3* 2.6* 2.7*   CBC: Recent Labs  Lab 10/29/23 1148 10/29/23 2325 10/31/23 0438 11/01/23 0631  WBC 9.6 10.3 11.0* 9.8  NEUTROABS 4,992 5.3  --   --   HGB 12.4 10.9* 11.0* 10.7*  HCT 41.8 34.2* 36.8 34.9*  MCV 83.1 79.7* 84.4 81.9  PLT 500* 412* 416* 384   CBG: Recent Labs  Lab 11/01/23 0744 11/01/23 1202 11/01/23 1631 11/01/23 2037 11/02/23 0804  GLUCAP 313* 305* 325* 206* 178*   Hgb A1c No results for input(s): "HGBA1C" in the last 72 hours. Lipid Profile No results for input(s): "CHOL", "HDL", "LDLCALC", "TRIG", "CHOLHDL", "LDLDIRECT" in the last 72 hours. Thyroid function studies No results for input(s): "TSH", "T4TOTAL", "T3FREE", "THYROIDAB" in the last 72 hours.  Invalid input(s): "FREET3" Urinalysis    Component Value Date/Time   COLORURINE COLORLESS (A) 10/29/2023 2325   APPEARANCEUR CLEAR 10/29/2023 2325   LABSPEC 1.023 10/29/2023 2325   PHURINE 6.5 10/29/2023 2325   GLUCOSEU >1,000 (A) 10/29/2023 2325   HGBUR SMALL (A) 10/29/2023 2325   BILIRUBINUR NEGATIVE 10/29/2023 2325   BILIRUBINUR NEG 08/03/2020 1113   KETONESUR NEGATIVE 10/29/2023 2325   PROTEINUR 100 (A) 10/29/2023 2325   UROBILINOGEN 0.2 08/03/2020 1113   UROBILINOGEN 1.0 01/19/2012 0015   NITRITE NEGATIVE 10/29/2023 2325   LEUKOCYTESUR SMALL (A) 10/29/2023 2325    FURTHER DISCHARGE INSTRUCTIONS:   Get Medicines reviewed and adjusted: Please take all your medications with you for your next visit with your Primary MD   Laboratory/radiological data: Please request your Primary MD to go over all hospital tests and procedure/radiological results at the follow up, please ask your Primary MD to get all Hospital records sent to his/her office.   In some cases, they will be blood  work, cultures and biopsy results pending at the time of your discharge. Please request that your primary care M.D. goes through all the records  of your hospital data and follows up on these results.   Also Note the following: If you experience worsening of your admission symptoms, develop shortness of breath, life threatening emergency, suicidal or homicidal thoughts you must seek medical attention immediately by calling 911 or calling your MD immediately  if symptoms less severe.   You must read complete instructions/literature along with all the possible adverse reactions/side effects for all the Medicines you take and that have been prescribed to you. Take any new Medicines after you have completely understood and accpet all the possible adverse reactions/side effects.    Do not drive when taking Pain medications or sleeping medications (Benzodaizepines)   Do not take more than prescribed Pain, Sleep and Anxiety Medications. It is not advisable to combine anxiety,sleep and pain medications without talking with your primary care practitioner   Special Instructions: If you have smoked or chewed Tobacco  in the last 2 yrs please stop smoking, stop any regular Alcohol  and or any Recreational drug use.   Wear Seat belts while driving.   Please note: You were cared for by a hospitalist during your hospital stay. Once you are discharged, your primary care physician will handle any further medical issues. Please note that NO REFILLS for any discharge medications will be authorized once you are discharged, as it is imperative that you return to your primary care physician (or establish a relationship with a primary care physician if you do not have one) for your post hospital discharge needs so that they can reassess your need for medications and monitor your lab values.  Time coordinating discharge: 40 minutes  SIGNED:  Pamella Pert, MD, PhD 11/02/2023, 10:48 AM

## 2023-11-02 NOTE — Progress Notes (Signed)
   11/02/2023  Patient ID: Carolyn Zimmerman, female   DOB: 01/09/1986, 38 y.o.   MRN: [id number]   Contacted patient regarding referral for medication access from Angus Seller, NP.   Appointment scheduled for 11/04/22 tentative to patient's discharge.  Harlon Flor, PharmD Clinical Pharmacist  [phone]

## 2023-11-02 NOTE — Inpatient Diabetes Management (Signed)
Inpatient Diabetes Program Recommendations  AACE/ADA: New Consensus Statement on Inpatient Glycemic Control (2015)  Target Ranges:  Prepandial:   less than 140 mg/dL      Peak postprandial:   less than 180 mg/dL (1-2 hours)      Critically ill patients:  140 - 180 mg/dL    Latest Reference Range & Units 11/01/23 07:44 11/01/23 12:02 11/01/23 16:31 11/01/23 20:37 11/02/23 08:04  Glucose-Capillary 70 - 99 mg/dL 102 (H) 725 (H) 366 (H) 206 (H) 178 (H)    Review of Glycemic Control  Diabetes history: DM2 Outpatient Diabetes medications: Lantus 25 units QHS, Humalog 6 units TID with meals, Metformin 1000 mg BID Current orders for Inpatient glycemic control: Semglee 30 units daily, Novolog 0-20 units TID with meals, Novolog 0-5 units at bedtime, Novolog 6 units TID with meals, Metformin XR 1000 mg BID  Inpatient Diabetes Program Recommendations:    Insulin: Please consider increasing Semglee to 32 units daily and meal coverage to Novolog 10 units TID with meals.  Discharge Recommendations: Other recommendations: Dexcom G7 sensors (#[id number]) Long acting recommendations: Insulin Glargine (LANTUS) Solostar Pen 32 units daily  Short acting recommendations:  Meal + Correction coverage Insulin aspart (NOVOLOG) FlexPen  Sensitive Scale.  10 units TID with meals plus correction Hypoglycemia treatment recommendations: Baqsimi 3mg  Supply/Referral recommendations: Pen needles - standard   Use Adult Diabetes Insulin Treatment Post Discharge order set.  Thanks, Orlando Penner, RN, MSN, CDCES Diabetes Coordinator Inpatient Diabetes Program [phone] (Team Pager from 8am to 5pm)

## 2023-11-04 ENCOUNTER — Telehealth: Payer: Self-pay

## 2023-11-04 DIAGNOSIS — I5022 Chronic systolic (congestive) heart failure: Secondary | ICD-10-CM

## 2023-11-04 DIAGNOSIS — R29898 Other symptoms and signs involving the musculoskeletal system: Secondary | ICD-10-CM

## 2023-11-04 DIAGNOSIS — Z8673 Personal history of transient ischemic attack (TIA), and cerebral infarction without residual deficits: Secondary | ICD-10-CM

## 2023-11-04 LAB — CULTURE, BLOOD (ROUTINE X 2)
Culture: NO GROWTH
Culture: NO GROWTH
Special Requests: ADEQUATE
Special Requests: ADEQUATE

## 2023-11-04 NOTE — Telephone Encounter (Signed)
Copied from CRM [id number]. Topic: General - Other >> Nov 02, 2023  2:43 PM Patsy Lager T wrote: Reason for CRM: Ryan from [facility] called to see if he could get authorization or orders to continue services for PT and OT on patient. Patient was discharged from hospital without orders on yesterday. Please f/u with Alycia Rossetti [phone]

## 2023-11-05 ENCOUNTER — Telehealth: Payer: Self-pay

## 2023-11-05 ENCOUNTER — Telehealth: Payer: Self-pay | Admitting: Pharmacist

## 2023-11-05 ENCOUNTER — Telehealth: Payer: Self-pay | Admitting: *Deleted

## 2023-11-05 DIAGNOSIS — Z794 Long term (current) use of insulin: Secondary | ICD-10-CM

## 2023-11-05 NOTE — Transitions of Care (Post Inpatient/ED Visit) (Signed)
11/05/2023  Name: Carolyn Zimmerman MRN: [id number] DOB: 10-06-1985  Today's TOC FU Call Status: Today's TOC FU Call Status:: Successful TOC FU Call Completed TOC FU Call Complete Date: 11/05/23 Patient's Name and Date of Birth confirmed.  Transition Care Management Follow-up Telephone Call Date of Discharge: 11/02/23 Discharge Facility: [facility]) Type of Discharge: Inpatient Admission Primary Inpatient Discharge Diagnosis:: Lower extremity cellulitis How have you been since you were released from the hospital?: Better ("looking much better") Any questions or concerns?: No  Items Reviewed: Did you receive and understand the discharge instructions provided?: Yes Medications obtained,verified, and reconciled?: Yes (Medications Reviewed) Any new allergies since your discharge?: No Dietary orders reviewed?: Yes Type of Diet Ordered:: Diet heart healthy/carb modified Do you have support at home?: Yes People in Home: sibling(s), parent(s) Name of Support/Comfort Primary Source: Lives alone with her younger children; Support is her mother and sister  Medications Reviewed Today: Medications Reviewed Today     Reviewed by Wyline Mood, RN (Case Manager) on 11/05/23 at 1139  Med List Status: <None>   Medication Order Taking? Sig Documenting Provider Last Dose Status Informant  albuterol (VENTOLIN HFA) 108 (90 Base) MCG/ACT inhaler [id number] Yes Inhale 2 puffs into the lungs every 4 (four) hours as needed for wheezing or shortness of breath. Leatha Gilding, MD Taking Active   ALEVE 220 MG tablet [id number] Yes Take 220-1,100 mg by mouth daily as needed (for headaches or mild pain). [provider] Taking Active Self, Pharmacy Records  amLODipine (NORVASC) 10 MG tablet [id number] Yes Take 1 tablet (10 mg total) by mouth daily. Leatha Gilding, MD Taking Active   aspirin 81 MG chewable tablet [id number] Yes Chew 1 tablet (81 mg total) by mouth daily. Ngetich, Donalee Citrin, NP  Taking Active Self, Pharmacy Records           Med Note (CRUTHIS, CHLOE C   Wed Oct 31, 2023  8:34 AM) Per pt last year she lost her job and no longer had insurance. Due to this pt has not taken this medication in months and no longer has at home.  carvedilol (COREG) 25 MG tablet [id number] Yes Take 1 tablet (25 mg total) by mouth 2 (two) times daily. Leatha Gilding, MD Taking Active   doxycycline (VIBRAMYCIN) 100 MG capsule [id number] Yes Take 1 capsule (100 mg total) by mouth 2 (two) times daily for 5 days. Leatha Gilding, MD Taking Active   hydrochlorothiazide (HYDRODIURIL) 25 MG tablet [id number] Yes Take 1 tablet (25 mg total) by mouth daily. Leatha Gilding, MD Taking Active   Insulin Glargine Solostar (LANTUS) 100 UNIT/ML Solostar Pen [id number] Yes Inject 25 Units into the skin daily. Leatha Gilding, MD Taking Active   insulin lispro (HUMALOG) 100 UNIT/ML KwikPen [id number] Yes Inject 10 Units into the skin 3 (three) times daily. Leatha Gilding, MD Taking Active   Insulin Pen Needle 31G X 6 MM MISC [id number] Yes use to inject insulin Leatha Gilding, MD Taking Active   metFORMIN (GLUCOPHAGE-XR) 500 MG 24 hr tablet [id number] Yes Take 2 tablets (1,000 mg total) by mouth in the morning and at bedtime. Leatha Gilding, MD Taking Active   nicotine (NICODERM CQ - DOSED IN MG/24 HOURS) 14 mg/24hr patch [id number] Yes Place 1 patch (14 mg total) onto the skin daily as needed (smoking censations). Leatha Gilding, MD Taking Active   rosuvastatin (CRESTOR) 20 MG tablet [id number] Yes Take 1 tablet (20 mg total) by mouth daily.  Leatha Gilding, MD Taking Active   spironolactone (ALDACTONE) 50 MG tablet [id number] Yes Take 1 tablet (50 mg total) by mouth daily. Leatha Gilding, MD Taking Active   torsemide (DEMADEX) 20 MG tablet [id number] Yes Take 2 tablets (40 mg total) by mouth daily. Leatha Gilding, MD Taking Active   valsartan (DIOVAN) 320 MG tablet [id number] Yes Take 1 tablet (320  mg total) by mouth daily. Leatha Gilding, MD Taking Active             Home Care and Equipment/Supplies: Were Home Health Services Ordered?: Yes Name of Home Health Agency:: [facility], PT/OT, [facility] 11/05/23 Has Agency set up a time to come to your home?: Yes First Home Health Visit Date: 11/05/23 Any new equipment or medical supplies ordered?: Yes Name of Medical supply agency?: [facility] physical therapist provided patient with a cane Were you able to get the equipment/medical supplies?: Yes Do you have any questions related to the use of the equipment/supplies?: No  Functional Questionnaire: Do you need assistance with bathing/showering or dressing?: No Do you need assistance with meal preparation?: Yes (Initally, until fully regain strength, endurance.  Supportive family who assist) Do you need assistance with eating?: No Do you have difficulty maintaining continence: No Do you need assistance with getting out of bed/getting out of a chair/moving?: Yes (Decreased strength, mobility d/t prolonged inpatient and outpatient treatment/FU for LE cellulitis)  Follow up appointments reviewed: PCP Follow-up appointment confirmed?: Yes Date of PCP follow-up appointment?: 11/12/23 Follow-up Provider: Richarda Blade, NP Specialist Hospital Follow-up appointment confirmed?: Yes Date of Specialist follow-up appointment?: 11/06/23 Follow-Up Specialty Provider:: Eye appointment 11/07/23, 11/29/23 Neurology - Jacquelyne Balint MD (prior hx Stroke) Do you need transportation to your follow-up appointment?: No Transportation Need Intervention Addressed By::  (Patient current has open referrals to pharmacy and social worker.  Will confirm with these disciplines that the patient is back home) Do you understand care options if your condition(s) worsen?: Yes-patient verbalized understanding  SDOH Interventions Today    Flowsheet Row Most Recent Value  SDOH Interventions   Food Insecurity Interventions  AMB Referral, Other (Comment)  [AMB referral open and assigned for SW to assist with community resources, SDOH barriers. Next sw appt 11/13/23.]  Housing Interventions AMB Referral  [AMB referral open and assigned for SW to assist with community resources, SDOH barriers. Next sw appt 11/13/23.]      Interventions Today    Flowsheet Row Most Recent Value  Chronic Disease   Chronic disease during today's visit Diabetes, Congestive Heart Failure (CHF), Other  [Cellulitis of Lower Extremity, failed outpatient]  General Interventions   General Interventions Discussed/Reviewed General Interventions Discussed, General Interventions Reviewed, Annual Eye Exam, Durable Medical Equipment (DME), Community Resources, Doctor Visits  Doctor Visits Discussed/Reviewed Doctor Visits Discussed, Doctor Visits Reviewed, PCP, Database administrator (DME) Glucomoter, Other  [Cane, Dexcom sensors for glucometer (?needs new prescription)]  PCP/Specialist Visits Compliance with follow-up visit  Communication with PCP/Specialists, Pharmacists, Social Work  [To advise of patient recent hospitalization and discharge back to home,  resumptions of services (SDOH barriers - SW, RPH)]  Exercise Interventions   Exercise Discussed/Reviewed Physical Activity, Assistive device use and maintanence  Physical Activity Discussed/Reviewed Physical Activity Discussed, Physical Activity Reviewed  [Safe use of cane per physical therapy instructions,  increase activity slowly, balance with rest.]  Education Interventions   Education Provided Provided Education  Provided Verbal Education On Nutrition, Blood Sugar Monitoring, Medication, When to see the doctor, Insurance Plans  Mental Health Interventions   Mental Health Discussed/Reviewed Refer to Social Work for resources  Refer to Social Work for resources regarding Other  [Has open referrals for SW and RPH]  Nutrition Interventions   Nutrition Discussed/Reviewed  Nutrition Discussed, Nutrition Reviewed, Portion sizes, Decreasing sugar intake, Decreasing fats, Decreasing salt  [Carb modified (decrease high carbohydrate foods)]  Pharmacy Interventions   Pharmacy Dicussed/Reviewed Pharmacy Topics Discussed, Pharmacy Topics Reviewed, Medications and their functions, Medication Adherence, Affording Medications  Referral to Pharmacist Cannot afford medications  [Open AMB referral to [facility] for medication access,  uncontrolled DM, HLD, HTN,CHF]  Safety Interventions   Safety Discussed/Reviewed Fall Risk  Home Safety Assistive Devices  [place] - PT/OT resumed services 11/05/23.]       TOC Interventions Today    Flowsheet Row Most Recent Value  TOC Interventions   TOC Interventions Discussed/Reviewed TOC Interventions Discussed, TOC Interventions Reviewed, Post discharge activity limitations per provider, S/S of infection     Reviewed/discussed TOC 30-day program and weekly RN phone calls to provide support and education during post-hospitalization recovery period.  Patient in agreement and consents to enroll in [facility] 30 day program.   RNCM contact information provided for any health related needs, questions or concerns that may occur prior to our next appointment.   Follow up:  Next appointment scheduled with RN Care Manager, Wyline Mood:  11/14/23 at 11 AM.  Wyline Mood BSN, RN RN Care Manager   Transitions of Care  [facility] / [facility], [facility] Direct Dial Number: [phone]  Fax: [phone]

## 2023-11-05 NOTE — Progress Notes (Unsigned)
11/05/2023 Name: Carolyn Zimmerman MRN: [id number] DOB: December 28, 1985  Chief Complaint  Patient presents with   Medication Management    Medication Access    [name] is a 38 y.o. year old female who presented for a telephone visit.   They were referred to the pharmacist by their Case Management Team  for assistance in managing medication access.   She has a medical history significant for anxiety, depression, coronavirus infection, left MCA watershed CVA, hyperlipidemia, hypertension, hypertensive heart disease, systolic heart failure with EF of 40 to 45% , obesity, tobacco use, history of acute pancreatitis, insulin-dependent type 2 diabetes,  who was recently hospitalized for right lower extremity cellulitis.  Upon arrival to ED, Patient had been out of all of her medications due to not having insurance.    When I spoke with her today, she said she now has [place] Zimm[place] Medicaid and has all of her prescriptions in her possession.    Subjective:  Care Team: Primary Care Provider: Caesar Bookman, NP ; Next Scheduled Visit: 11/12/2023  Medication Access/Adherence  Current Pharmacy:  [facility] [address] [place] [address] Phone: [phone] Fax: [phone]  [facility] [address] [place] [address] Phone: [phone] Fax: [phone]   Patient reports affordability concerns with their medications: Yes  Patient reports access/transportation concerns to their pharmacy: No  Patient reports adherence concerns with their medications:  Yes  Cost   Diabetes:  Current medications:  Humalog 10 units three times daily with meals Lantus 25 units daily Metformin 1000 mg twice daily  Current glucose readings: She is not currently checking her blood sugars.  She reported using the Dexcom G7 previously but she said the constant alarms were aggrivating.  She said she now prefers  to go back to pricking her finger.  She said she could not find her meter but said she would look for it tonight.  We can send in new prescriptions for testing supplies if necessary.    Patient denies hypoglycemic/hyperglycemic symptoms   Objective:  Lab Results  Component Value Date   HGBA1C 11.3 (H) 10/29/2023    Lab Results  Component Value Date   CREATININE 0.93 11/01/2023   BUN 22 (H) 11/01/2023   NA 132 (L) 11/01/2023   K 4.1 11/01/2023   CL 99 11/01/2023   CO2 24 11/01/2023    Lab Results  Component Value Date   CHOL 178 10/29/2023   HDL 40 (L) 10/29/2023   LDLCALC  10/29/2023     Comment:     . LDL cholesterol not calculated. Triglyceride levels greater than 400 mg/dL invalidate calculated LDL results. . Reference range: <100 . Desirable range <100 mg/dL for primary prevention;   <70 mg/dL for patients with CHD or diabetic patients  with > or = 2 CHD risk factors. Marland Kitchen LDL-C is now calculated using the Martin-Hopkins  calculation, which is a validated novel method providing  better accuracy than the Friedewald equation in the  estimation of LDL-C.  Horald Pollen et al. Lenox Ahr. 9518;841(66): 2061-2068  (http://education.[employer].com/faq/FAQ164)    LDLDIRECT 64.0 03/26/2023   TRIG 417 (H) 10/29/2023   CHOLHDL 4.5 10/29/2023    Medications Reviewed Today     Reviewed by Beecher Mcardle, [facility] (Pharmacist) on 11/05/23 at 1754  Med List Status: <None>   Medication Order Taking? Sig Documenting Provider Last Dose Status Informant  albuterol (VENTOLIN HFA) 108 (90 Base) MCG/ACT  inhaler [id number] Yes Inhale 2 puffs into the lungs every 4 (four) hours as needed for wheezing or shortness of breath. Leatha Gilding, MD Taking Active   ALEVE 220 MG tablet [id number] Yes Take 220-1,100 mg by mouth daily as needed (for headaches or mild pain). [provider] Taking Active Self, Pharmacy Records  amLODipine (NORVASC) 10 MG tablet [id number] Yes Take 1 tablet  (10 mg total) by mouth daily. Leatha Gilding, MD Taking Active   aspirin 81 MG chewable tablet [id number] Yes Chew 1 tablet (81 mg total) by mouth daily. Ngetich, Donalee Citrin, NP Taking Active Self, Pharmacy Records           Med Note Gildardo Griffes Nov 05, 2023  5:52 PM)    carvedilol (COREG) 25 MG tablet [id number] Yes Take 1 tablet (25 mg total) by mouth 2 (two) times daily. Leatha Gilding, MD Taking Active   doxycycline (VIBRAMYCIN) 100 MG capsule [id number] Yes Take 1 capsule (100 mg total) by mouth 2 (two) times daily for 5 days. Leatha Gilding, MD Taking Active   hydrochlorothiazide (HYDRODIURIL) 25 MG tablet [id number] Yes Take 1 tablet (25 mg total) by mouth daily. Leatha Gilding, MD Taking Active   Insulin Glargine Solostar (LANTUS) 100 UNIT/ML Solostar Pen [id number] Yes Inject 25 Units into the skin daily. Leatha Gilding, MD Taking Active   insulin lispro (HUMALOG) 100 UNIT/ML KwikPen [id number] Yes Inject 10 Units into the skin 3 (three) times daily. Leatha Gilding, MD Taking Active   Insulin Pen Needle 31G X 6 MM MISC [id number] Yes use to inject insulin Leatha Gilding, MD Taking Active   metFORMIN (GLUCOPHAGE-XR) 500 MG 24 hr tablet [id number] Yes Take 2 tablets (1,000 mg total) by mouth in the morning and at bedtime. Leatha Gilding, MD Taking Active   nicotine (NICODERM CQ - DOSED IN MG/24 HOURS) 14 mg/24hr patch [id number]  Place 1 patch (14 mg total) onto the skin daily as needed (smoking censations). Leatha Gilding, MD  Active   rosuvastatin (CRESTOR) 20 MG tablet [id number] Yes Take 1 tablet (20 mg total) by mouth daily. Leatha Gilding, MD Taking Active   spironolactone (ALDACTONE) 50 MG tablet [id number] Yes Take 1 tablet (50 mg total) by mouth daily. Leatha Gilding, MD Taking Active   torsemide (DEMADEX) 20 MG tablet [id number] Yes Take 2 tablets (40 mg total) by mouth daily. Leatha Gilding, MD Taking Active   valsartan (DIOVAN) 320 MG tablet [id number]  Yes Take 1 tablet (320 mg total) by mouth daily. Leatha Gilding, MD Taking Active               Assessment/Plan:   Diabetes: - Currently uncontrolled HgA1c 11.3%- had been out of her medications - Reviewed long term cardiovascular and renal outcomes of uncontrolled blood sugar - Reviewed goal A1c, goal fasting, and goal 2 hour post prandial glucose - Recommend to look for her blood glucose meter and let me know what kind it is so we can send in an order for supplies     Follow Up Plan:    Will call patient back in 1 day to see about her meter. Will send a message to her Provider about reordering supplies.   Beecher Mcardle, PharmD, BCACP Clinical Pharmacist [phone]

## 2023-11-05 NOTE — Telephone Encounter (Signed)
Orders for Home Health PT/OT ordered.

## 2023-11-05 NOTE — Telephone Encounter (Signed)
Carolyn Zimmerman with [facility] called requesting verbal orders for PT 1X3week.   Verbal Orders given.

## 2023-11-06 ENCOUNTER — Telehealth: Payer: Self-pay | Admitting: Pharmacist

## 2023-11-06 NOTE — Progress Notes (Signed)
   11/06/2023  Patient ID: Carolyn Zimmerman, female   DOB: 08/04/1986, 38 y.o.   MRN: [id number]  Reason for referral: Medication Management  Referral source: [facility] RN   Reason for call: Follow up on blood glucose meter  Outreach:  Unsuccessful telephone call attempt #1 to patient.   HIPAA compliant voicemail left requesting a return call  Plan:  -I will make another outreach attempt to patient within 3-4 business days.   Beecher Mcardle, PharmD, BCACP Clinical Pharmacist [phone]

## 2023-11-07 ENCOUNTER — Telehealth: Payer: Self-pay | Admitting: Pharmacist

## 2023-11-07 DIAGNOSIS — E1165 Type 2 diabetes mellitus with hyperglycemia: Secondary | ICD-10-CM

## 2023-11-07 NOTE — Progress Notes (Signed)
   11/07/2023  Patient ID: Carolyn Zimmerman, female   DOB: [birth year], 38 y.o.   MRN: [id number]  Reason for referral: Medication Management   Referral source: [facility] RN     Reason for call: Follow up on blood glucose meter   Outreach:  Unsuccessful telephone call attempt#2 to patient.   HIPAA compliant voicemail left requesting a return call   Plan:  -I will make another outreach attempt to patient within 3-4 business days.    Beecher Mcardle, PharmD, BCACP Clinical Pharmacist [phone]

## 2023-11-09 ENCOUNTER — Ambulatory Visit (HOSPITAL_COMMUNITY): Payer: Medicaid Other | Attending: Family

## 2023-11-09 ENCOUNTER — Telehealth: Payer: Self-pay | Admitting: Pharmacist

## 2023-11-09 NOTE — Progress Notes (Signed)
   11/09/2023  Patient ID: Carolyn Zimmerman, female   DOB: 1986-07-14, 38 y.o.   MRN: [id number]    Reason for referral: Medication Management  Referral source: [facility] Social Worker   Reason for call: Follow up on blood glucose meter  Outreach:  Unsuccessful telephone call attempt #3 to patient.   HIPAA compliant voicemail left requesting a return call  Plan:  -I will make another outreach attempt to patient in 1 week.Beecher Mcardle, PharmD, BCACP Clinical Pharmacist [phone]

## 2023-11-12 ENCOUNTER — Encounter: Payer: Medicaid Other | Admitting: Family

## 2023-11-12 NOTE — Progress Notes (Signed)
   This encounter was created in error - please disregard. No show

## 2023-11-13 ENCOUNTER — Ambulatory Visit: Payer: Self-pay

## 2023-11-13 NOTE — Patient Outreach (Signed)
 Care Coordination   11/13/2023 Name: Serena Petterson MRN: [id number] DOB: October 11, 1985   Care Coordination Outreach Attempts:  An unsuccessful outreach was attempted for an appointment today.  Follow Up Plan:  Additional outreach attempts will be made to offer the patient complex care management information and services.   Encounter Outcome:  No Answer   Care Coordination Interventions:  No, not indicated    Lysle Morales, BSW [place]  [facility], [facility] Social Worker Direct Dial: [phone]  Fax: [phone] Website: [email or web address]

## 2023-11-14 ENCOUNTER — Other Ambulatory Visit: Payer: Self-pay

## 2023-11-14 NOTE — Patient Outreach (Signed)
 Care Management  Transitions of Care Program Transitions of Care Post-discharge week 4  11/14/2023 Name: Jeweldean Drohan MRN: [id number] DOB: [birth year]  Subjective: Laquandra Carrillo is a 38 y.o. year old female who is a primary care patient of Ngetich, [name], NP. The Care Management team was unable to reach the patient by phone to assess and address transitions of care needs.   Plan: Additional outreach attempts will be made to reach the patient enrolled in the [facility] Program (Post Inpatient/ED Visit).   Wyline Mood BSN, RN Medical illustrator, Transition of Care [place] / [facility], Population Health Direct Dial:  [phone] / Fax: [phone] Email:  [email or web address]

## 2023-11-15 ENCOUNTER — Other Ambulatory Visit: Payer: Self-pay

## 2023-11-15 ENCOUNTER — Encounter: Payer: Self-pay | Admitting: Family

## 2023-11-15 ENCOUNTER — Ambulatory Visit (INDEPENDENT_AMBULATORY_CARE_PROVIDER_SITE_OTHER): Payer: Medicaid Other | Admitting: Family

## 2023-11-15 VITALS — BP 118/84 | HR 59 | Temp 98.3°F | Resp 18 | Ht 67.0 in | Wt 274.6 lb

## 2023-11-15 DIAGNOSIS — M7989 Other specified soft tissue disorders: Secondary | ICD-10-CM

## 2023-11-15 DIAGNOSIS — E782 Mixed hyperlipidemia: Secondary | ICD-10-CM

## 2023-11-15 DIAGNOSIS — E1165 Type 2 diabetes mellitus with hyperglycemia: Secondary | ICD-10-CM

## 2023-11-15 DIAGNOSIS — R110 Nausea: Secondary | ICD-10-CM

## 2023-11-15 DIAGNOSIS — I10 Essential (primary) hypertension: Secondary | ICD-10-CM

## 2023-11-15 DIAGNOSIS — I5022 Chronic systolic (congestive) heart failure: Secondary | ICD-10-CM | POA: Diagnosis not present

## 2023-11-15 DIAGNOSIS — M79661 Pain in right lower leg: Secondary | ICD-10-CM

## 2023-11-15 DIAGNOSIS — Z794 Long term (current) use of insulin: Secondary | ICD-10-CM | POA: Diagnosis not present

## 2023-11-15 DIAGNOSIS — I5020 Unspecified systolic (congestive) heart failure: Secondary | ICD-10-CM

## 2023-11-15 MED ORDER — SACCHAROMYCES BOULARDII 250 MG PO CAPS
250.0000 mg | ORAL_CAPSULE | Freq: Two times a day (BID) | ORAL | 0 refills | Status: AC
  Filled 2023-11-15: qty 20, 10d supply, fill #0

## 2023-11-15 NOTE — Progress Notes (Signed)
 Provider: Richarda Blade FNP-C  Takayla Baillie, Donalee Citrin, NP  Patient Care Team: Jarius Dieudonne, Donalee Citrin, NP as PCP - General (Family Medicine) Thomasene Ripple, DO as PCP - Cardiology (Cardiology) Karie Soda, MD as Consulting Physician (General Surgery) Catalina Antigua, MD as Consulting Physician (Obstetrics and Gynecology) Graciella Freer Care Management Wyline Mood, RN as [facility] Care Management  Extended Emergency Contact Information Primary Emergency Contact: Roma Kayser [place] Mobile Phone: [phone] Relation: Mother Secondary Emergency Contact: rhodes,shalaka Mobile Phone: [phone] Relation: Sister  Code Status: Full Code  Goals of care: Advanced Directive information    11/15/2023    9:05 AM  Advanced Directives  Does Patient Have a Medical Advance Directive? No  Would patient like information on creating a medical advance directive? No - Patient declined     Chief Complaint  Patient presents with   Medical Management of Chronic Issues    2 week follow up. Discuss the need for covid, eye exam and cervical cancer. Has concerns about being feeling sick to stomach with bad stomach pain and can't sleep.   Urinary Tract Infection    Discussed the use of AI scribe software for clinical note transcription with the patient, who gave verbal consent to proceed.  History of Present Illness   Joslin Doell is a 38 year old female who presents with persistent nausea and stomach burning post-hospital discharge.  She has been experiencing severe nausea and a burning sensation in her upper stomach since her recent hospital discharge. The sensation is described as feeling like 'somebody burning me in my stomach.' These symptoms are persistent, with nausea exacerbated by eating, leading to episodes of vomiting. No diarrhea is present. She has tried [employer] and peppermint tea with minimal relief and plans to try ginger. No bloating, gas, abdominal  distension, or acid reflux.  She was recently discharged from the hospital after treatment for cellulitis of the right lower leg. She completed a course of antibiotics, which reduced swelling and redness, but persistent pain remains in the area. A follow-up appointment is scheduled in April for further evaluation, including x-rays.  Her blood pressure was previously uncontrolled, leading to an episode of chest pain during her hospital stay. She is now back on her blood pressure medication, and her blood pressure is currently stable. She has not been monitoring her blood pressure at home due to feeling unwell.  She has a history of diabetes and is using a Dexcom meter, which has been malfunctioning, showing only 'high' readings. She continues her diabetes medications, including Lantus, Humalog, and metformin. She has not experienced hypoglycemia.  She has not smoked in 37 days and is no longer using a nicotine patch.  She is currently taking Aleve 220 mg daily, aspirin, amlodipine 10 mg, albuterol, carvedilol, hydrochlorothiazide, rosuvastatin, spironolactone, and desonide 20 mg, along with valsartan 320 mg. Zofran causes cardiac issues and a substitute medication given in the hospital induced panic attacks.    Past Medical History:  Diagnosis Date   Abscess of chest wall 06/15/2017   Coronavirus infection 05/02/2020   CVA (cerebral vascular accident) (HCC) 07/14/2022   L MCA CVA, watershed infarct   Diabetes mellitus    Heart failure with mildly reduced ejection fraction (HFmrEF) (HCC) 09/29/2021   EF 45%, moderate LVH   Hyperlipidemia associated with type 2 diabetes mellitus (HCC)    Hypertension    Jaundice    Obesity    TOA (tubo-ovarian abscess) 09/2019   Past Surgical History:  Procedure Laterality Date   CHOLECYSTECTOMY     IRRIGATION AND DEBRIDEMENT ABSCESS N/A 05/02/2020   Procedure: IRRIGATION AND DEBRIDEMENT ABDOMINAL and CHEST WALL NECROTIZING FASCITITS;  Surgeon: Karie Soda, MD;  Location: [facility];  Service: General;  Laterality: N/A;   liver stent      Allergies  Allergen Reactions   Contrast Media [Iodinated Contrast Media] Hives and Swelling    Outpatient Encounter Medications as of 11/15/2023  Medication Sig   albuterol (VENTOLIN HFA) 108 (90 Base) MCG/ACT inhaler Inhale 2 puffs into the lungs every 4 (four) hours as needed for wheezing or shortness of breath.   ALEVE 220 MG tablet Take 220-1,100 mg by mouth daily as needed (for headaches or mild pain).   amLODipine (NORVASC) 10 MG tablet Take 1 tablet (10 mg total) by mouth daily.   aspirin 81 MG chewable tablet Chew 1 tablet (81 mg total) by mouth daily.   carvedilol (COREG) 25 MG tablet Take 1 tablet (25 mg total) by mouth 2 (two) times daily.   hydrochlorothiazide (HYDRODIURIL) 25 MG tablet Take 1 tablet (25 mg total) by mouth daily.   Insulin Glargine Solostar (LANTUS) 100 UNIT/ML Solostar Pen Inject 25 Units into the skin daily.   insulin lispro (HUMALOG) 100 UNIT/ML KwikPen Inject 10 Units into the skin 3 (three) times daily.   Insulin Pen Needle 31G X 6 MM MISC use to inject insulin   metFORMIN (GLUCOPHAGE-XR) 500 MG 24 hr tablet Take 2 tablets (1,000 mg total) by mouth in the morning and at bedtime.   rosuvastatin (CRESTOR) 20 MG tablet Take 1 tablet (20 mg total) by mouth daily.   saccharomyces boulardii (FLORASTOR) 250 MG capsule Take 1 capsule (250 mg total) by mouth 2 (two) times daily for 10 days.   spironolactone (ALDACTONE) 50 MG tablet Take 1 tablet (50 mg total) by mouth daily.   torsemide (DEMADEX) 20 MG tablet Take 2 tablets (40 mg total) by mouth daily.   valsartan (DIOVAN) 320 MG tablet Take 1 tablet (320 mg total) by mouth daily.   [DISCONTINUED] nicotine (NICODERM CQ - DOSED IN MG/24 HOURS) 14 mg/24hr patch Place 1 patch (14 mg total) onto the skin daily as needed (smoking censations). (Patient not taking: Reported on 11/15/2023)   No facility-administered encounter  medications on file as of 11/15/2023.    Review of Systems  Constitutional:  Negative for appetite change, chills, fatigue, fever and unexpected weight change.  HENT:  Negative for congestion, dental problem, ear discharge, ear pain, facial swelling, hearing loss, nosebleeds, postnasal drip, rhinorrhea, sinus pressure, sinus pain, sneezing, sore throat, tinnitus and trouble swallowing.   Eyes:  Negative for pain, discharge, redness, itching and visual disturbance.  Respiratory:  Negative for cough, chest tightness, shortness of breath and wheezing.   Cardiovascular:  Negative for chest pain, palpitations and leg swelling.  Gastrointestinal:  Positive for nausea. Negative for abdominal distention, abdominal pain, blood in stool, constipation, diarrhea and vomiting.  Endocrine: Negative for cold intolerance, heat intolerance, polydipsia, polyphagia and polyuria.  Genitourinary:  Negative for difficulty urinating, dysuria, flank pain, frequency and urgency.  Musculoskeletal:  Negative for arthralgias, back pain, gait problem, joint swelling, myalgias, neck pain and neck stiffness.  Skin:  Negative for color change, pallor, rash and wound.  Neurological:  Negative for dizziness, syncope, speech difficulty, weakness, light-headedness, numbness and headaches.  Hematological:  Does not bruise/bleed easily.  Psychiatric/Behavioral:  Negative for agitation, behavioral problems, confusion, hallucinations, self-injury, sleep disturbance and suicidal ideas. The patient is  not nervous/anxious.     Immunization History  Administered Date(s) Administered   Influenza, Seasonal, Injecte, Preservative Fre 10/13/2023   Influenza,inj,Quad PF,6+ Mos 06/16/2017   PNEUMOCOCCAL CONJUGATE-20 10/13/2023   Pneumococcal Polysaccharide-23 06/17/2017   Pertinent  Health Maintenance Due  Topic Date Due   OPHTHALMOLOGY EXAM  Never done   HEMOGLOBIN A1C  04/27/2024   FOOT EXAM  10/28/2024   INFLUENZA VACCINE  Completed       11/30/2022    2:25 PM 03/29/2023   11:23 AM 10/29/2023   10:54 AM 11/02/2023    2:28 PM 11/15/2023    9:04 AM  Fall Risk  Falls in the past year? 0 0 0 0   Was there an injury with Fall? 0 0 0 0 0  Fall Risk Category Calculator 0 0 0 0   Patient at Risk for Falls Due to  No Fall Risks No Fall Risks  No Fall Risks  Fall risk Follow up Falls evaluation completed Falls evaluation completed Falls evaluation completed;Education provided;Falls prevention discussed  Falls evaluation completed   Functional Status Survey:    Vitals:   11/15/23 0840  BP: 118/84  Pulse: (!) 59  Resp: 18  Temp: 98.3 F (36.8 C)  SpO2: 99%  Weight: 274 lb 9.6 oz (124.6 kg)  Height: 5\' 7"  (1.702 m)   Body mass index is 43.01 kg/m. Physical Exam Vitals reviewed.  Constitutional:      General: She is not in acute distress.    Appearance: Normal appearance. She is obese. She is not ill-appearing or diaphoretic.  HENT:     Head: Normocephalic.     Mouth/Throat:     Mouth: Mucous membranes are moist.     Pharynx: No oropharyngeal exudate.  Eyes:     General: No scleral icterus.       Right eye: No discharge.        Left eye: No discharge.     Conjunctiva/sclera: Conjunctivae normal.     Pupils: Pupils are equal, round, and reactive to light.  Neck:     Vascular: No carotid bruit.  Cardiovascular:     Rate and Rhythm: Normal rate and regular rhythm.     Pulses: Normal pulses.     Heart sounds: Normal heart sounds. No murmur heard.    No friction rub. No gallop.  Pulmonary:     Effort: Pulmonary effort is normal. No respiratory distress.     Breath sounds: Normal breath sounds. No wheezing, rhonchi or rales.  Chest:     Chest wall: No tenderness.  Abdominal:     General: Bowel sounds are normal. There is no distension.     Palpations: Abdomen is soft. There is no mass.     Tenderness: There is no abdominal tenderness. There is no right CVA tenderness, left CVA tenderness, guarding or  rebound.  Musculoskeletal:        General: No swelling or tenderness. Normal range of motion.     Cervical back: Normal range of motion. No rigidity or tenderness.     Right lower leg: No edema.     Left lower leg: No edema.     Comments: Right leg previous leg swelling and redness resolved   Lymphadenopathy:     Cervical: No cervical adenopathy.  Skin:    General: Skin is warm and dry.     Coloration: Skin is not pale.     Findings: No bruising, erythema, lesion or rash.  Neurological:  Mental Status: She is alert and oriented to person, place, and time.     Motor: No weakness.     Gait: Gait normal.  Psychiatric:        Mood and Affect: Mood normal.        Speech: Speech normal.        Behavior: Behavior normal.     Labs reviewed: Recent Labs    10/12/23 1338 10/13/23 0652 10/14/23 0554 10/15/23 0512 10/29/23 2325 10/31/23 0438 11/01/23 0631  NA 135 134* 135   < > 126* 133* 132*  K 3.4* 4.0 3.3*   < > 3.9 4.0 4.1  CL 102 102 103   < > 92* 99 99  CO2 23 23 25    < > 26 22 24   GLUCOSE 124* 225* 162*   < > 544* 292* 318*  BUN 11 11 14    < > 18 20 22*  CREATININE 1.01* 0.86 0.73   < > 0.90 1.07* 0.93  CALCIUM 7.7* 7.7* 7.5*   < > 8.7* 8.8* 9.0  MG 1.6* 2.2 1.9  --   --   --  1.7  PHOS 2.0*  --  2.5  --   --   --   --    < > = values in this interval not displayed.   Recent Labs    10/29/23 2325 10/31/23 0438 11/01/23 0631  AST 11* 17 20  ALT 12 18 18   ALKPHOS 104 74 85  BILITOT 0.3 0.4 0.4  PROT 7.2 7.0 6.9  ALBUMIN 3.3* 2.6* 2.7*   Recent Labs    10/21/23 0548 10/29/23 1148 10/29/23 2325 10/31/23 0438 11/01/23 0631  WBC 12.1* 9.6 10.3 11.0* 9.8  NEUTROABS 7.6 4,992 5.3  --   --   HGB 9.7* 12.4 10.9* 11.0* 10.7*  HCT 32.5* 41.8 34.2* 36.8 34.9*  MCV 83.1 83.1 79.7* 84.4 81.9  PLT 364 500* 412* 416* 384   Lab Results  Component Value Date   TSH 1.13 10/29/2023   Lab Results  Component Value Date   HGBA1C 11.3 (H) 10/29/2023   Lab Results   Component Value Date   CHOL 178 10/29/2023   HDL 40 (L) 10/29/2023   LDLCALC  10/29/2023     Comment:     . LDL cholesterol not calculated. Triglyceride levels greater than 400 mg/dL invalidate calculated LDL results. . Reference range: <100 . Desirable range <100 mg/dL for primary prevention;   <70 mg/dL for patients with CHD or diabetic patients  with > or = 2 CHD risk factors. Marland Kitchen LDL-C is now calculated using the Martin-Hopkins  calculation, which is a validated novel method providing  better accuracy than the Friedewald equation in the  estimation of LDL-C.  Horald Pollen et al. Lenox Ahr. 1610;960(45): 2061-2068  (http://education.[employer].com/faq/FAQ164)    LDLDIRECT 64.0 03/26/2023   TRIG 417 (H) 10/29/2023   CHOLHDL 4.5 10/29/2023    Significant Diagnostic Results in last 30 days:  ECHOCARDIOGRAM COMPLETE Result Date: 11/01/2023    ECHOCARDIOGRAM REPORT   Patient Name:   RENELL COAXUM Date of Exam: 11/01/2023 Medical Rec #:  [id number]    Height:       67.0 in Accession #:    [id number]   Weight:       277.6 lb Date of Birth:  Jul 11, 1986    BSA:          2.325 m Patient Age:    37 years     BP:  148/77 mmHg Patient Gender: F            HR:           83 bpm. Exam Location:  Inpatient Procedure: 2D Echo, 3D Echo, Cardiac Doppler, Color Doppler and Strain Analysis            (Both Spectral and Color Flow Doppler were utilized during            procedure). Indications:    Chest Pain  History:        Patient has prior history of Echocardiogram examinations, most                 recent 03/09/2023. Risk Factors:Hypertension, Diabetes,                 Dyslipidemia and Former Smoker.  Sonographer:    Karma Ganja Referring Phys: [id number] Daylene Katayama [facility]  Sonographer Comments: Global longitudinal strain was attempted. IMPRESSIONS  1. Left ventricular ejection fraction, by estimation, is 40 to 45%. Left ventricular ejection fraction by 3D volume is 40 %. The left ventricle has mildly  decreased function. The left ventricle demonstrates global hypokinesis. The left ventricular internal cavity size was mildly to moderately dilated. There is moderate left ventricular hypertrophy. Left ventricular diastolic parameters are consistent with Grade I diastolic dysfunction (impaired relaxation). The average left ventricular global longitudinal strain is -3.2 %. The global longitudinal strain is abnormal.  2. Right ventricular systolic function is normal. The right ventricular size is normal. There is normal pulmonary artery systolic pressure.  3. Left atrial size was mildly dilated.  4. A small pericardial effusion is present.  5. The mitral valve is normal in structure. No evidence of mitral valve regurgitation. No evidence of mitral stenosis.  6. The aortic valve is normal in structure. Aortic valve regurgitation is not visualized. No aortic stenosis is present.  7. The inferior vena cava is normal in size with greater than 50% respiratory variability, suggesting right atrial pressure of 3 mmHg. Comparison(s): No significant change from prior study. FINDINGS  Left Ventricle: Left ventricular ejection fraction, by estimation, is 40 to 45%. Left ventricular ejection fraction by 3D volume is 40 %. The left ventricle has mildly decreased function. The left ventricle demonstrates global hypokinesis. The average left ventricular global longitudinal strain is -3.2 %. Strain was performed and the global longitudinal strain is abnormal. The left ventricular internal cavity size was mildly to moderately dilated. There is moderate left ventricular hypertrophy. Left ventricular diastolic parameters are consistent with Grade I diastolic dysfunction (impaired relaxation). Right Ventricle: The right ventricular size is normal. No increase in right ventricular wall thickness. Right ventricular systolic function is normal. There is normal pulmonary artery systolic pressure. The tricuspid regurgitant velocity is 1.44 m/s,  and  with an assumed right atrial pressure of 3 mmHg, the estimated right ventricular systolic pressure is 11.3 mmHg. Left Atrium: Left atrial size was mildly dilated. Right Atrium: Right atrial size was normal in size. Pericardium: A small pericardial effusion is present. Mitral Valve: The mitral valve is normal in structure. No evidence of mitral valve regurgitation. No evidence of mitral valve stenosis. Tricuspid Valve: The tricuspid valve is normal in structure. Tricuspid valve regurgitation is not demonstrated. No evidence of tricuspid stenosis. Aortic Valve: The aortic valve is normal in structure. Aortic valve regurgitation is not visualized. No aortic stenosis is present. Aortic valve mean gradient measures 6.0 mmHg. Aortic valve peak gradient measures 12.4 mmHg. Aortic valve area, by VTI measures  1.65 cm. Pulmonic Valve: The pulmonic valve was normal in structure. Pulmonic valve regurgitation is not visualized. No evidence of pulmonic stenosis. Aorta: The aortic root is normal in size and structure. Venous: The inferior vena cava is normal in size with greater than 50% respiratory variability, suggesting right atrial pressure of 3 mmHg. IAS/Shunts: No atrial level shunt detected by color flow Doppler. Additional Comments: 3D was performed not requiring image post processing on an independent workstation and was abnormal.  LEFT VENTRICLE PLAX 2D LVIDd:         6.20 cm         Diastology LVIDs:         5.10 cm         LV e' medial:    4.46 cm/s LV PW:         1.50 cm         LV E/e' medial:  14.3 LV IVS:        1.70 cm         LV e' lateral:   3.92 cm/s LVOT diam:     2.00 cm         LV E/e' lateral: 16.3 LV SV:         44 LV SV Index:   19              2D LVOT Area:     3.14 cm        Longitudinal                                Strain                                2D Strain GLS  -3.5 % LV Volumes (MOD)               (A2C): LV vol d, MOD    108.0 ml      2D Strain GLS  -3.3 % A2C:                            (A3C): LV vol d, MOD    110.0 ml      2D Strain GLS  -2.7 % A4C:                           (A4C): LV vol s, MOD    58.5 ml       2D Strain GLS  -3.2 % A2C:                           Avg: LV vol s, MOD    63.3 ml A4C:                           3D Volume EF LV SV MOD A2C:   49.5 ml       LV 3D EF:    Left LV SV MOD A4C:   110.0 ml                   ventricul LV SV MOD BP:    49.3 ml                    ar  ejection                                             fraction                                             by 3D                                             volume is                                             40 %.                                 3D Volume EF:                                3D EF:        40 %                                LV EDV:       226 ml                                LV ESV:       136 ml                                LV SV:        91 ml RIGHT VENTRICLE             IVC RV Basal diam:  3.10 cm     IVC diam: 1.40 cm RV S prime:     21.20 cm/s TAPSE (M-mode): 2.7 cm LEFT ATRIUM             Index        RIGHT ATRIUM           Index LA diam:        4.70 cm 2.02 cm/m   RA Area:     14.00 cm LA Vol (A2C):   89.1 ml 38.33 ml/m  RA Volume:   26.20 ml  11.27 ml/m LA Vol (A4C):   87.5 ml 37.64 ml/m LA Biplane Vol: 91.8 ml 39.49 ml/m  AORTIC VALVE AV Area (Vmax):    1.62 cm AV Area (Vmean):   1.64 cm AV Area (VTI):     1.65 cm AV Vmax:           176.00 cm/s AV Vmean:          106.000 cm/s AV VTI:            0.266 m AV Peak Grad:      12.4 mmHg AV  Mean Grad:      6.0 mmHg LVOT Vmax:         90.80 cm/s LVOT Vmean:        55.300 cm/s LVOT VTI:          0.140 m LVOT/AV VTI ratio: 0.53  AORTA Ao Root diam: 3.20 cm Ao Asc diam:  2.60 cm MITRAL VALVE               TRICUSPID VALVE MV Area (PHT): 4.24 cm    TR Peak grad:   8.3 mmHg MV Decel Time: 179 msec    TR Vmax:        144.00 cm/s MV E velocity: 63.80 cm/s MV A velocity: 58.70 cm/s  SHUNTS MV E/A ratio:   1.09        Systemic VTI:  0.14 m                            Systemic Diam: 2.00 cm Clearnce Hasten Electronically signed by Clearnce Hasten Signature Date/Time: 11/01/2023/12:29:05 PM    Final    VAS [place] LOWER EXTREMITY VENOUS (DVT) Result Date: 10/31/2023  Lower Venous DVT Study Patient Name:  DANE BLOCH  Date of Exam:   10/31/2023 Medical Rec #: [id number]     Accession #:    [id number] Date of Birth: [birth year]     Patient Gender: F Patient Age:   22 years Exam Location:  [facility] Procedure:      VAS [place] LOWER EXTREMITY VENOUS (DVT) Referring Phys: DAVID ORTIZ --------------------------------------------------------------------------------  Indications: Swelling.  Risk Factors: None identified. Limitations: Body habitus, poor ultrasound/tissue interface and patient pain tolerance. Comparison Study: No prior studies. Performing Technologist: Chanda Busing RVT  Examination Guidelines: A complete evaluation includes B-mode imaging, spectral Doppler, color Doppler, and power Doppler as needed of all accessible portions of each vessel. Bilateral testing is considered an integral part of a complete examination. Limited examinations for reoccurring indications may be performed as noted. The reflux portion of the exam is performed with the patient in reverse Trendelenburg.  +---------+---------------+---------+-----------+----------+--------------+ RIGHT    CompressibilityPhasicitySpontaneityPropertiesThrombus Aging +---------+---------------+---------+-----------+----------+--------------+ CFV      Full           Yes      Yes                                 +---------+---------------+---------+-----------+----------+--------------+ SFJ      Full                                                        +---------+---------------+---------+-----------+----------+--------------+ FV Prox  Full                                                         +---------+---------------+---------+-----------+----------+--------------+ FV Mid   Full                                                        +---------+---------------+---------+-----------+----------+--------------+  FV DistalFull                                                        +---------+---------------+---------+-----------+----------+--------------+ PFV      Full                                                        +---------+---------------+---------+-----------+----------+--------------+ POP      Full           Yes      Yes                                 +---------+---------------+---------+-----------+----------+--------------+ PTV      Full                                                        +---------+---------------+---------+-----------+----------+--------------+ PERO     Full                                                        +---------+---------------+---------+-----------+----------+--------------+   +----+---------------+---------+-----------+----------+--------------+ LEFTCompressibilityPhasicitySpontaneityPropertiesThrombus Aging +----+---------------+---------+-----------+----------+--------------+ CFV Full           Yes      Yes                                 +----+---------------+---------+-----------+----------+--------------+     Summary: RIGHT: - There is no evidence of deep vein thrombosis in the lower extremity. However, portions of this examination were limited- see technologist comments above.  - No cystic structure found in the popliteal fossa.  LEFT: - No evidence of common femoral vein obstruction.   *See table(s) above for measurements and observations. Electronically signed by Gerarda Fraction on 10/31/2023 at 4:05:31 PM.    Final    DG Chest Port 1 View Result Date: 10/30/2023 CLINICAL DATA:  Right leg pain, redness and swelling. EXAM: PORTABLE CHEST 1 VIEW COMPARISON:  October 11, 2023 FINDINGS: There is  stable mild to moderate severity cardiac silhouette enlargement. Mild, stable lingular linear scarring and/or atelectasis is seen. There is no evidence of an acute infiltrate, pleural effusion or pneumothorax. The visualized skeletal structures are unremarkable. IMPRESSION: Stable cardiomegaly with mild, stable lingular linear scarring and/or atelectasis. Electronically Signed   By: Aram Candela M.D.   On: 10/30/2023 00:36   [place] RT LOWER EXTREM LTD SOFT TISSUE NON VASCULAR Result Date: 10/17/2023 CLINICAL DATA:  Cellulitis and abscess of right leg. EXAM: ULTRASOUND RIGHT LOWER EXTREMITY LIMITED TECHNIQUE: Ultrasound examination of the lower extremity soft tissues was performed in the area of clinical concern. COMPARISON:  CT 10/11/2023 FINDINGS: Targeted sonographic evaluation in the area of clinical concern labeled right calf. Subcutaneous edema without focal fluid collection. No gross hyperemia.  IMPRESSION: Subcutaneous edema without focal fluid collection. Electronically Signed   By: Narda Rutherford M.D.   On: 10/17/2023 18:41    Assessment/Plan     Gastritis Persistent burning sensation in the upper abdomen and nausea, likely secondary to recent antibiotic use disrupting gut flora. No diarrhea reported. Pain is localized to the upper abdomen and is tender to touch. No signs of acid reflux or bloating. Discussed the use of over-the-counter probiotics and dietary modifications including bland foods and ginger to manage symptoms. Patient unable to take Zofran due to cardiac issues and experiences panic attacks with alternative antiemetics. - Recommend over-the-counter probiotic (e.g., Align, Floresta) - Advise bland foods like crackers, toast, or bananas - Suggest ginger tea or diet ginger ale - Monitor for any worsening symptoms or new symptoms such as diarrhea or blood in stool  Diabetes Mellitus Blood glucose levels are not well-controlled, with Dexcom meter showing 'high' readings. Patient  is on multiple diabetes medications. Dexcom meter may need replacement soon. - Continue current diabetes medications (Lantus, Humalog, metformin) - Recheck blood work including CBC, BMP, and HbA1c at next appointment - Advise monitoring blood glucose levels and report significant changes  Hypertension Blood pressure well-controlled on current medication regimen. Recent hospitalization noted uncontrolled hypertension likely contributing to demand ischemia. Current BP reading is 118/84 mmHg. Patient not currently monitoring BP at home due to feeling unwell. - Continue current antihypertensive medications (amlodipine, carvedilol, hydrochlorothiazide, valsartan) - Schedule routine follow-up in six months to monitor blood pressure  Hyperlipidemia Patient is on rosuvastatin for cholesterol management. No new issues reported. - Continue rosuvastatin as prescribed  Cellulitis (resolved) Recent hospitalization for cellulitis of the right lower leg. Completed course of antibiotics with resolution of redness and swelling. No signs of DVT on Doppler. Pain persists in the area. Follow-up with specialist for further evaluation including x-rays scheduled in April. - Follow up with specialist for x-rays in April - Monitor for any recurrence of redness, swelling, or pain  Nicotine Dependence (in remission) Patient has not smoked in 37 days and is no longer using the nicotine patch. - Remove nicotine patch from medication list  Follow-up - Schedule six-month follow-up appointment - Order CBC, BMP, and HbA1c for next appointment - Call patient with lab results.   Family/ staff Communication: Reviewed plan of care with patient verbalized understanding   Labs/tests ordered: None   Next Appointment: Return in about 6 months (around 05/14/2024) for medical mangement of chronic issues..   Total time: 30 minutes. Greater than 50% of total time spent doing patient education regarding right leg cellulitis  ,nausea,T2DM,HTN, HLD,chronic back pain,health maintenance including symptom/medication management.   Caesar Bookman, NP

## 2023-11-16 ENCOUNTER — Other Ambulatory Visit: Payer: Self-pay

## 2023-11-16 ENCOUNTER — Telehealth: Payer: Self-pay | Admitting: Family

## 2023-11-16 ENCOUNTER — Other Ambulatory Visit: Payer: Self-pay | Admitting: Pharmacist

## 2023-11-16 DIAGNOSIS — M7989 Other specified soft tissue disorders: Secondary | ICD-10-CM

## 2023-11-16 DIAGNOSIS — E1165 Type 2 diabetes mellitus with hyperglycemia: Secondary | ICD-10-CM

## 2023-11-16 LAB — CBC WITH DIFFERENTIAL/PLATELET
Absolute Lymphocytes: 3631 {cells}/uL (ref 850–3900)
Absolute Monocytes: 777 {cells}/uL (ref 200–950)
Basophils Absolute: 46 {cells}/uL (ref 0–200)
Basophils Relative: 0.4 %
Eosinophils Absolute: 186 {cells}/uL (ref 15–500)
Eosinophils Relative: 1.6 %
HCT: 40.6 % (ref 35.0–45.0)
Hemoglobin: 12.1 g/dL (ref 11.7–15.5)
MCH: 25.3 pg — ABNORMAL LOW (ref 27.0–33.0)
MCHC: 29.8 g/dL — ABNORMAL LOW (ref 32.0–36.0)
MCV: 84.8 fL (ref 80.0–100.0)
MPV: 11.2 fL (ref 7.5–12.5)
Monocytes Relative: 6.7 %
Neutro Abs: 6960 {cells}/uL (ref 1500–7800)
Neutrophils Relative %: 60 %
Platelets: 349 10*3/uL (ref 140–400)
RBC: 4.79 10*6/uL (ref 3.80–5.10)
RDW: 14.5 % (ref 11.0–15.0)
Total Lymphocyte: 31.3 %
WBC: 11.6 10*3/uL — ABNORMAL HIGH (ref 3.8–10.8)

## 2023-11-16 LAB — COMPLETE METABOLIC PANEL WITH GFR
AG Ratio: 0.9 (calc) — ABNORMAL LOW (ref 1.0–2.5)
ALT: 19 U/L (ref 6–29)
AST: 14 U/L (ref 10–30)
Albumin: 3.5 g/dL — ABNORMAL LOW (ref 3.6–5.1)
Alkaline phosphatase (APISO): 135 U/L — ABNORMAL HIGH (ref 31–125)
BUN/Creatinine Ratio: 22 (calc) (ref 6–22)
BUN: 28 mg/dL — ABNORMAL HIGH (ref 7–25)
CO2: 27 mmol/L (ref 20–32)
Calcium: 9.5 mg/dL (ref 8.6–10.2)
Chloride: 87 mmol/L — ABNORMAL LOW (ref 98–110)
Creat: 1.3 mg/dL — ABNORMAL HIGH (ref 0.50–0.97)
Globulin: 3.7 g/dL (ref 1.9–3.7)
Glucose, Bld: 614 mg/dL (ref 65–139)
Potassium: 4.3 mmol/L (ref 3.5–5.3)
Sodium: 127 mmol/L — ABNORMAL LOW (ref 135–146)
Total Bilirubin: 0.2 mg/dL (ref 0.2–1.2)
Total Protein: 7.2 g/dL (ref 6.1–8.1)
eGFR: 55 mL/min/{1.73_m2} — ABNORMAL LOW (ref >=60)

## 2023-11-16 MED ORDER — INSULIN LISPRO (1 UNIT DIAL) 100 UNIT/ML (KWIKPEN)
14.0000 [IU] | PEN_INJECTOR | Freq: Three times a day (TID) | SUBCUTANEOUS | 3 refills | Status: DC
  Filled 2023-11-16: qty 3, 7d supply, fill #0

## 2023-11-16 MED ORDER — INSULIN LISPRO (1 UNIT DIAL) 100 UNIT/ML (KWIKPEN): 14.0000 [IU] | PEN_INJECTOR | Freq: Three times a day (TID) | SUBCUTANEOUS | Status: DC

## 2023-11-16 MED ORDER — DOXYCYCLINE HYCLATE 100 MG PO TABS
100.0000 mg | ORAL_TABLET | Freq: Two times a day (BID) | ORAL | 0 refills | Status: AC
  Filled 2023-11-16: qty 14, 7d supply, fill #0

## 2023-11-16 NOTE — Addendum Note (Signed)
 Addended byRicharda Blade C on: 11/16/2023 05:16 PM   Modules accepted: Orders

## 2023-11-16 NOTE — Progress Notes (Signed)
 11/16/2023 Name: Carolyn Zimmerman MRN: [id number] DOB: [birth year]  Chief Complaint  Patient presents with   Medication Management    Diabetes     Carolyn Zimmerman is a 38 y.o. year old female who presented for a telephone visit.   They were referred to the pharmacist by their Case Management Team  for assistance in managing diabetes.   Purpose of today's call was to follow up on her getting a blood glucose meter since she did not like the constant alarms on Dexcom.   Subjective:  Care Team: Primary Care Provider: Caesar Bookman, NP ; Next Scheduled Visit: 11/19/2023   Medication Access/Adherence  Current Pharmacy:  [facility] - [facility] [address] [place] [address] Phone: [phone] Fax: [phone]  [facility] [address] [place] [address] Phone: [phone] Fax: [phone]     Diabetes:  Current medications:  Humalog 10 units three times daily with meals Lantus 28 units daily (Increased today by her provider) Metformin 1000 mg 1 tablet three times daily  Patient has lab work done yesterday and her glucose was >600.  She said the nurse instructed her to give herself 20 units of insulin this morning when she called but she did not do it because she had not eaten.  When I spoke with the Patient earlier this afternoon, her blood sugar was 591.  I encouraged her to eat a light snack and inject her regular Humalog dose of 10 units. I called her about an hour later and her blood sugar was 571 mg/dl.  Patient was very discouraged because she said she is doing all we ask her to do yet her blood sugars are still elevated.  It is unclear if she still uses insulin when she does not feel well and is not eating.  Objective:  Lab Results  Component Value Date   HGBA1C 11.3 (H) 10/29/2023    Lab Results  Component Value Date   CREATININE 1.30 (H) 11/15/2023   BUN 28  (H) 11/15/2023   NA 127 (L) 11/15/2023   K 4.3 11/15/2023   CL 87 (L) 11/15/2023   CO2 27 11/15/2023    Lab Results  Component Value Date   CHOL 178 10/29/2023   HDL 40 (L) 10/29/2023   LDLCALC  10/29/2023     Comment:     . LDL cholesterol not calculated. Triglyceride levels greater than 400 mg/dL invalidate calculated LDL results. . Reference range: <100 . Desirable range <100 mg/dL for primary prevention;   <70 mg/dL for patients with CHD or diabetic patients  with > or = 2 CHD risk factors. Marland Kitchen LDL-C is now calculated using the Martin-Hopkins  calculation, which is a validated novel method providing  better accuracy than the Friedewald equation in the  estimation of LDL-C.  Horald Pollen et al. Lenox Ahr. 8416;606(30): 2061-2068  (http://education.[facility].com/faq/FAQ164)    LDLDIRECT 64.0 03/26/2023   TRIG 417 (H) 10/29/2023   CHOLHDL 4.5 10/29/2023    Medications Reviewed Today   Medications were not reviewed in this encounter       Assessment/Plan:   Diabetes: - Currently uncontrolled - Messaged with the Patient's PCP and discussed insulin titration.  Humalog dose increased to 14 units three times daily with meals and follow up with her PCP on Monday 11/19/2023 -Patient communicated understanding.  Follow Up Plan:   I will follow up with the patient on Tuesday after her appointment.  Beecher Mcardle, PharmD, BCACP Clinical Pharmacist [phone]

## 2023-11-16 NOTE — Telephone Encounter (Signed)
 Humalog insulin increased from 10 units to 14 units three times daily with meals.

## 2023-11-19 ENCOUNTER — Other Ambulatory Visit: Payer: Self-pay | Admitting: Pharmacist

## 2023-11-19 ENCOUNTER — Other Ambulatory Visit: Payer: Self-pay

## 2023-11-19 ENCOUNTER — Encounter: Payer: Self-pay | Admitting: Family

## 2023-11-19 DIAGNOSIS — E1165 Type 2 diabetes mellitus with hyperglycemia: Secondary | ICD-10-CM

## 2023-11-19 DIAGNOSIS — N289 Disorder of kidney and ureter, unspecified: Secondary | ICD-10-CM

## 2023-11-19 DIAGNOSIS — E871 Hypo-osmolality and hyponatremia: Secondary | ICD-10-CM

## 2023-11-19 LAB — HM DIABETES EYE EXAM

## 2023-11-19 MED ORDER — INSULIN LISPRO (1 UNIT DIAL) 100 UNIT/ML (KWIKPEN)
16.0000 [IU] | PEN_INJECTOR | Freq: Three times a day (TID) | SUBCUTANEOUS | Status: DC
Start: 2023-11-19 — End: 2024-01-03

## 2023-11-19 MED ORDER — INSULIN GLARGINE SOLOSTAR 100 UNIT/ML ~~LOC~~ SOPN: 30.0000 [IU] | PEN_INJECTOR | Freq: Every day | SUBCUTANEOUS | Status: DC

## 2023-11-19 NOTE — Progress Notes (Signed)
   11/19/2023 Name: Carolyn Zimmerman MRN: [id number] DOB: [birth year]  Chief Complaint  Patient presents with   Medication Management    Diabetes    Carolyn Zimmerman is a 38 y.o. year old female who presented for a telephone visit.   They were referred to the pharmacist by their Case Management Team  for assistance in managing diabetes. The purpose of today's call was to follow up on blood sugars.   Subjective:  Care Team: Primary Care Provider: Caesar Bookman, NP ; Next Scheduled Visit: 05/14/2024  Medication Access/Adherence  Current Pharmacy:  [facility] - [facility] [address] [place] [address] Phone: [phone] Fax: [phone]  [facility] - [facility] [address] [place] [address] Phone: [phone] Fax: [phone]   Patient reports affordability concerns with their medications: Yes  Patient reports access/transportation concerns to their pharmacy: No  Patient reports adherence concerns with their medications:  Yes      Diabetes:  Current medications:  Humalog 14 units three times daily Metformin 500 mg 2 tablets in the morning and at bedtime Lantus 25 units daily  Current glucose readings: Patient was driving home from an eye appointment at the time of my call and could not go through all of her readings.  She did say her blood sugars had come down to the high 300s-450 range.  We increased her Humalog to 14 units last week and Lantus to 28 units daily.   Observed patterns:  Patient denies hypoglycemic s/sx including  dizziness, shakiness, sweating. Patient denies hyperglycemic symptoms including  polyuria, polydipsia, polyphagia, nocturia, neuropathy, blurred vision.  Objective:  Lab Results  Component Value Date   HGBA1C 11.3 (H) 10/29/2023    Lab Results  Component Value Date   CREATININE 1.30 (H) 11/15/2023   BUN 28 (H) 11/15/2023   NA 127 (L) 11/15/2023   K  4.3 11/15/2023   CL 87 (L) 11/15/2023   CO2 27 11/15/2023    Lab Results  Component Value Date   CHOL 178 10/29/2023   HDL 40 (L) 10/29/2023   LDLCALC  10/29/2023     Comment:     . LDL cholesterol not calculated. Triglyceride levels greater than 400 mg/dL invalidate calculated LDL results. . Reference range: <100 . Desirable range <100 mg/dL for primary prevention;   <70 mg/dL for patients with CHD or diabetic patients  with > or = 2 CHD risk factors. Marland Kitchen LDL-C is now calculated using the Martin-Hopkins  calculation, which is a validated novel method providing  better accuracy than the Friedewald equation in the  estimation of LDL-C.  Horald Pollen et al. Lenox Ahr. 5638;756(43): 2061-2068  (http://education.[facility].com/faq/FAQ164)    LDLDIRECT 64.0 03/26/2023   TRIG 417 (H) 10/29/2023   CHOLHDL 4.5 10/29/2023    Medications Reviewed Today   Medications were not reviewed in this encounter       Assessment/Plan:   Diabetes: - Currently uncontrolled - Reviewed long term cardiovascular and renal outcomes of uncontrolled blood sugar -Recommend to increase Humalog to 16 units three times daily and increase Lantus to 30 units daily. (Spoke with Patient's PCP, Richarda Blade, NP)  -Patient communicated understanding.   Follow Up Plan:    Follow up with Patient in 3-4 days.  Beecher Mcardle, PharmD, BCACP Clinical Pharmacist [phone]

## 2023-11-19 NOTE — Telephone Encounter (Signed)
 Insulin prescription send.
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
## 2023-11-22 ENCOUNTER — Telehealth: Payer: Self-pay | Admitting: Pharmacist

## 2023-11-22 ENCOUNTER — Other Ambulatory Visit: Payer: Self-pay

## 2023-11-22 ENCOUNTER — Other Ambulatory Visit

## 2023-11-22 DIAGNOSIS — E1165 Type 2 diabetes mellitus with hyperglycemia: Secondary | ICD-10-CM

## 2023-11-22 NOTE — Progress Notes (Signed)
   11/22/2023  Patient ID: Carolyn Zimmerman, female   DOB: 08-Feb-1986, 38 y.o.   MRN: [id number]    Reason for referral: Medication Management  Referral source: [facility] RN   Reason for call: Follow up on blood sugars.  Outreach:  Unsuccessful telephone call attempt #1 to patient.   HIPAA compliant voicemail left requesting a return call  Plan:  -I will make another outreach attempt to patient in 1 business day.    Beecher Mcardle, PharmD, BCACP Clinical Pharmacist [phone]

## 2023-11-23 ENCOUNTER — Telehealth: Payer: Self-pay | Admitting: Pharmacist

## 2023-11-23 DIAGNOSIS — Z794 Long term (current) use of insulin: Secondary | ICD-10-CM

## 2023-11-23 LAB — CBC WITH DIFFERENTIAL/PLATELET
Absolute Lymphocytes: 4518 {cells}/uL — ABNORMAL HIGH (ref 850–3900)
Absolute Monocytes: 755 {cells}/uL (ref 200–950)
Basophils Absolute: 64 {cells}/uL (ref 0–200)
Basophils Relative: 0.5 %
Eosinophils Absolute: 269 {cells}/uL (ref 15–500)
Eosinophils Relative: 2.1 %
HCT: 36.9 % (ref 35.0–45.0)
Hemoglobin: 11.3 g/dL — ABNORMAL LOW (ref 11.7–15.5)
MCH: 25.6 pg — ABNORMAL LOW (ref 27.0–33.0)
MCHC: 30.6 g/dL — ABNORMAL LOW (ref 32.0–36.0)
MCV: 83.7 fL (ref 80.0–100.0)
MPV: 11.3 fL (ref 7.5–12.5)
Monocytes Relative: 5.9 %
Neutro Abs: 7194 {cells}/uL (ref 1500–7800)
Neutrophils Relative %: 56.2 %
Platelets: 341 10*3/uL (ref 140–400)
RBC: 4.41 10*6/uL (ref 3.80–5.10)
RDW: 15.2 % — ABNORMAL HIGH (ref 11.0–15.0)
Total Lymphocyte: 35.3 %
WBC: 12.8 10*3/uL — ABNORMAL HIGH (ref 3.8–10.8)

## 2023-11-23 LAB — BASIC METABOLIC PANEL WITH GFR
BUN/Creatinine Ratio: 20 (calc) (ref 6–22)
BUN: 25 mg/dL (ref 7–25)
CO2: 28 mmol/L (ref 20–32)
Calcium: 9.6 mg/dL (ref 8.6–10.2)
Chloride: 96 mmol/L — ABNORMAL LOW (ref 98–110)
Creat: 1.24 mg/dL — ABNORMAL HIGH (ref 0.50–0.97)
Glucose, Bld: 309 mg/dL — ABNORMAL HIGH (ref 65–139)
Potassium: 4.2 mmol/L (ref 3.5–5.3)
Sodium: 133 mmol/L — ABNORMAL LOW (ref 135–146)
eGFR: 58 mL/min/{1.73_m2} — ABNORMAL LOW (ref >=60)

## 2023-11-23 NOTE — Progress Notes (Signed)
   11/23/2023  Patient ID: [name], female   DOB: 1986-07-29, 38 y.o.   MRN: [id number]   T Reason for referral: Medication Management  Reason for call: Follow up on blood sugars.  Outreach:  Unsuccessful telephone call attempt #2 to patient.   Unable to leave message  Plan:  -I will make another outreach attempt to patient in 3-5 business days.   Beecher Mcardle, PharmD, BCACP Clinical Pharmacist [phone]

## 2023-11-26 ENCOUNTER — Telehealth: Payer: Self-pay | Admitting: Pharmacist

## 2023-11-26 DIAGNOSIS — E1165 Type 2 diabetes mellitus with hyperglycemia: Secondary | ICD-10-CM

## 2023-11-26 NOTE — Progress Notes (Signed)
   11/26/2023  Patient ID: Carolyn Zimmerman, female   DOB: 1986-06-17, 38 y.o.   MRN: [id number]    Reason for referral: Medication Management  Referral source: [facility] RN   Reason for call: Called to follow up on blood sugars and to help with insulin titration  Outreach:  Unsuccessful telephone call attempt #3 to patient.   HIPAA compliant voicemail left requesting a return call  Plan:  -I will make another outreach attempt to patient in 1 week.   Beecher Mcardle, PharmD, BCACP Clinical Pharmacist [phone]

## 2023-11-29 ENCOUNTER — Telehealth: Payer: Self-pay | Admitting: Pharmacist

## 2023-11-29 ENCOUNTER — Ambulatory Visit: Payer: No Typology Code available for payment source | Admitting: Neurology

## 2023-11-29 ENCOUNTER — Other Ambulatory Visit: Payer: Self-pay

## 2023-11-29 DIAGNOSIS — M79661 Pain in right lower leg: Secondary | ICD-10-CM

## 2023-11-29 DIAGNOSIS — E1165 Type 2 diabetes mellitus with hyperglycemia: Secondary | ICD-10-CM

## 2023-11-29 DIAGNOSIS — E871 Hypo-osmolality and hyponatremia: Secondary | ICD-10-CM

## 2023-11-29 NOTE — Progress Notes (Signed)
   11/29/2023  Patient ID: Carolyn Zimmerman, female   DOB: 1986/06/28, 38 y.o.   MRN: [id number]  Reason for referral: Medication Management   Referral source: [facility] RN     Reason for call: Called to follow up on blood sugars and to help with insulin titration   Outreach:  Unsuccessful telephone call attempt #4 to patient.   HIPAA compliant voicemail left requesting a return call   Plan:  -I will make another outreach attempt to patient in 1 month then will close the pharmacy case.    Beecher Mcardle, PharmD, BCACP Clinical Pharmacist [phone]             11/26/23 11:26 AM You attempted to contact [name], [name] (Left Message)

## 2023-12-03 ENCOUNTER — Telehealth: Payer: Self-pay | Admitting: Pharmacist

## 2023-12-03 DIAGNOSIS — Z794 Long term (current) use of insulin: Secondary | ICD-10-CM

## 2023-12-03 NOTE — Progress Notes (Signed)
   12/03/2023  Patient ID: Carolyn Zimmerman, female   DOB: 1986-09-02, 38 y.o.   MRN: [id number]  eason for referral: Medication Management   Referral source: [facility] RN     Reason for call: Called to follow up on blood sugars and to help with insulin titration   Outreach:  Unsuccessful telephone call attempt #5 to patient.   HIPAA compliant voicemail left requesting a return call   Plan:  -I will make another outreach attempt to patient in 1 month then will close the pharmacy case.    Beecher Mcardle, PharmD, BCACP Clinical Pharmacist [phone]

## 2023-12-05 ENCOUNTER — Encounter: Payer: Self-pay | Admitting: Family

## 2023-12-05 ENCOUNTER — Ambulatory Visit (INDEPENDENT_AMBULATORY_CARE_PROVIDER_SITE_OTHER): Admitting: Family

## 2023-12-05 ENCOUNTER — Other Ambulatory Visit: Payer: Self-pay

## 2023-12-05 VITALS — BP 136/88 | HR 77 | Temp 97.9°F | Resp 20 | Ht 67.0 in | Wt 294.2 lb

## 2023-12-05 DIAGNOSIS — L02411 Cutaneous abscess of right axilla: Secondary | ICD-10-CM | POA: Diagnosis not present

## 2023-12-05 MED ORDER — DOXYCYCLINE HYCLATE 100 MG PO TABS
100.0000 mg | ORAL_TABLET | Freq: Two times a day (BID) | ORAL | 0 refills | Status: AC
  Filled 2023-12-05: qty 14, 7d supply, fill #0

## 2023-12-06 ENCOUNTER — Other Ambulatory Visit: Payer: Self-pay

## 2023-12-13 ENCOUNTER — Other Ambulatory Visit: Payer: Self-pay

## 2023-12-16 NOTE — Progress Notes (Signed)
 Provider: Richarda Blade FNP-C  Anesia Blackwell, Donalee Citrin, NP  Patient Care Team: Acheron Sugg, Donalee Citrin, NP as PCP - General (Family Medicine) Thomasene Ripple, DO as PCP - Cardiology (Cardiology) Karie Soda, MD as Consulting Physician (General Surgery) Catalina Antigua, MD as Consulting Physician (Obstetrics and Gynecology) Wyline Mood, RN (Inactive) as [facility] Care Management  Extended Emergency Contact Information Primary Emergency Contact: Roma Kayser [place] Mobile Phone: [phone] Relation: Mother Secondary Emergency Contact: rhodes,shalaka Mobile Phone: [phone] Relation: Sister  Code Status:  Full Code  Goals of care: Advanced Directive information    12/05/2023    2:26 PM  Advanced Directives  Does Patient Have a Medical Advance Directive? No  Would patient like information on creating a medical advance directive? No - Patient declined     Chief Complaint  Patient presents with   Follow-up    1 week follow up.    Discussed the use of AI scribe software for clinical note transcription with the patient, who gave verbal consent to proceed.  History of Present Illness   Carolyn Zimmerman is a 38 year old female with diabetes who presents with leg swelling and pain.  She experiences significant swelling and pain in her legs, describing them as swollen, painful, and warm, though not as hot as during her previous hospitalization. The swelling is persistent and causes discomfort. She has not yet seen the vascular specialist, as her appointment is scheduled for April 17th.  Her blood glucose levels remain elevated despite treatment, with home readings ranging from 250 to 400 mg/dL. She is currently taking insulin three times a day, 20 units each time, and a long-acting insulin at 30 units. She also takes metformin twice daily. Despite these measures, her blood sugar remains high, with a recent reading of 420 mg/dL without food intake. She reports running out of  insulin due to frequent use.  She has a history of cellulitis and abscesses, previously requiring surgical intervention and long-term antibiotics. She recalls being told she might need lifelong antibiotics due to past infections. Currently, she reports warmth in her abdomen and a draining lesion under her arm, which she cleans regularly. She uses antibacterial soap.  Recent lab results show an increase in white blood cell count from 11.0 to 12.8. Her hemoglobin has decreased slightly from 12.1 to 11.3, and her sodium level has improved from 127 to 133. She experiences nausea but denies vomiting or diarrhea.  She is allergic to contrast media. Her mother assists with meal preparation, including liver and fresh beets, to address anemia.   Past Medical History:  Diagnosis Date   Abscess of chest wall 06/15/2017   Coronavirus infection 05/02/2020   CVA (cerebral vascular accident) (HCC) 07/14/2022   L MCA CVA, watershed infarct   Diabetes mellitus    Heart failure with mildly reduced ejection fraction (HFmrEF) (HCC) 09/29/2021   EF 45%, moderate LVH   Hyperlipidemia associated with type 2 diabetes mellitus (HCC)    Hypertension    Jaundice    Obesity    TOA (tubo-ovarian abscess) 09/2019   Past Surgical History:  Procedure Laterality Date   CHOLECYSTECTOMY     IRRIGATION AND DEBRIDEMENT ABSCESS N/A 05/02/2020   Procedure: IRRIGATION AND DEBRIDEMENT ABDOMINAL and CHEST WALL NECROTIZING FASCITITS;  Surgeon: Karie Soda, MD;  Location: [facility];  Service: General;  Laterality: N/A;   liver stent      Allergies  Allergen Reactions   Contrast Media [Iodinated Contrast Media] Hives and Swelling  Outpatient Encounter Medications as of 12/05/2023  Medication Sig   albuterol (VENTOLIN HFA) 108 (90 Base) MCG/ACT inhaler Inhale 2 puffs into the lungs every 4 (four) hours as needed for wheezing or shortness of breath.   ALEVE 220 MG tablet Take 220-1,100 mg by mouth daily as needed (for  headaches or mild pain).   amLODipine (NORVASC) 10 MG tablet Take 1 tablet (10 mg total) by mouth daily.   aspirin 81 MG chewable tablet Chew 1 tablet (81 mg total) by mouth daily.   carvedilol (COREG) 25 MG tablet Take 1 tablet (25 mg total) by mouth 2 (two) times daily.   doxycycline (VIBRA-TABS) 100 MG tablet Take 1 tablet (100 mg total) by mouth 2 (two) times daily for 7 days.   hydrochlorothiazide (HYDRODIURIL) 25 MG tablet Take 1 tablet (25 mg total) by mouth daily.   Insulin Glargine Solostar (LANTUS) 100 UNIT/ML Solostar Pen Inject 30 Units into the skin daily.   insulin lispro (HUMALOG) 100 UNIT/ML KwikPen Inject 16 Units into the skin 3 (three) times daily.   Insulin Pen Needle 31G X 6 MM MISC use to inject insulin   metFORMIN (GLUCOPHAGE-XR) 500 MG 24 hr tablet Take 2 tablets (1,000 mg total) by mouth in the morning and at bedtime.   rosuvastatin (CRESTOR) 20 MG tablet Take 1 tablet (20 mg total) by mouth daily.   spironolactone (ALDACTONE) 50 MG tablet Take 1 tablet (50 mg total) by mouth daily.   torsemide (DEMADEX) 20 MG tablet Take 2 tablets (40 mg total) by mouth daily.   valsartan (DIOVAN) 320 MG tablet Take 1 tablet (320 mg total) by mouth daily.   No facility-administered encounter medications on file as of 12/05/2023.    Review of Systems  Constitutional:  Negative for appetite change, chills, fatigue, fever and unexpected weight change.  HENT:  Negative for congestion, dental problem, ear discharge, ear pain, facial swelling, hearing loss, nosebleeds, postnasal drip, rhinorrhea, sinus pressure, sinus pain, sneezing, sore throat, tinnitus and trouble swallowing.   Eyes:  Negative for pain, discharge, redness, itching and visual disturbance.  Respiratory:  Negative for cough, chest tightness, shortness of breath and wheezing.   Cardiovascular:  Positive for leg swelling. Negative for chest pain and palpitations.  Gastrointestinal:  Negative for abdominal distention,  abdominal pain, blood in stool, constipation, diarrhea, nausea and vomiting.  Endocrine: Negative for cold intolerance, heat intolerance, polydipsia, polyphagia and polyuria.  Genitourinary:  Negative for difficulty urinating, dysuria, flank pain, frequency and urgency.  Musculoskeletal:  Negative for arthralgias, back pain, gait problem, joint swelling, myalgias, neck pain and neck stiffness.  Skin:  Negative for color change, pallor, rash and wound.       Right arm abscess   Neurological:  Negative for dizziness, syncope, speech difficulty, weakness, light-headedness, numbness and headaches.  Hematological:  Does not bruise/bleed easily.  Psychiatric/Behavioral:  Negative for agitation, behavioral problems, confusion, hallucinations, self-injury, sleep disturbance and suicidal ideas. The patient is not nervous/anxious.     Immunization History  Administered Date(s) Administered   Influenza, Seasonal, Injecte, Preservative Fre 10/13/2023   Influenza,inj,Quad PF,6+ Mos 06/16/2017   PNEUMOCOCCAL CONJUGATE-20 10/13/2023   Pneumococcal Polysaccharide-23 06/17/2017   Pertinent  Health Maintenance Due  Topic Date Due   HEMOGLOBIN A1C  04/27/2024   FOOT EXAM  10/28/2024   OPHTHALMOLOGY EXAM  11/18/2024   INFLUENZA VACCINE  Completed      03/29/2023   11:23 AM 10/29/2023   10:54 AM 11/02/2023    2:28 PM 11/15/2023  9:04 AM 12/05/2023    2:24 PM  Fall Risk  Falls in the past year? 0 0 0  0  Was there an injury with Fall? 0 0 0 0 0  Fall Risk Category Calculator 0 0 0  0  Patient at Risk for Falls Due to No Fall Risks No Fall Risks  No Fall Risks No Fall Risks  Fall risk Follow up Falls evaluation completed Falls evaluation completed;Education provided;Falls prevention discussed  Falls evaluation completed Falls evaluation completed   Functional Status Survey:    Vitals:   12/05/23 1429  BP: 136/88  Pulse: 77  Resp: 20  Temp: 97.9 F (36.6 C)  SpO2: 98%  Weight: 294 lb 3.2 oz  (133.4 kg)  Height: 5\' 7"  (1.702 m)   Body mass index is 46.08 kg/m. Physical Exam GENERAL: Alert, cooperative, well developed, no acute distress. HEENT: Normocephalic, normal oropharynx, moist mucous membranes, throat clear, no sinus tenderness. CHEST: Clear to auscultation bilaterally, no wheezes, rhonchi, or crackles. CARDIOVASCULAR: Normal heart rate and rhythm, S1 and S2 normal without murmurs. ABDOMEN: Soft, non-tender, non-distended, without organomegaly, normal bowel sounds, no abdominal skin redness. EXTREMITIES: No cyanosis or edema, bilateral leg tenderness on palpation, right leg circumference 48 cm. NEUROLOGICAL: Cranial nerves grossly intact, moves all extremities without gross motor or sensory deficit. SKIN: Redness at the base of the wound under the arm. PSYCHIATRY/BEHAVIORAL: Mood stable   Labs reviewed: Recent Labs    10/12/23 1338 10/13/23 0652 10/14/23 0554 10/15/23 0512 11/01/23 0631 11/15/23 0958 11/22/23 0813  NA 135 134* 135   < > 132* 127* 133*  K 3.4* 4.0 3.3*   < > 4.1 4.3 4.2  CL 102 102 103   < > 99 87* 96*  CO2 23 23 25    < > 24 27 28   GLUCOSE 124* 225* 162*   < > 318* 614* 309*  BUN 11 11 14    < > 22* 28* 25  CREATININE 1.01* 0.86 0.73   < > 0.93 1.30* 1.24*  CALCIUM 7.7* 7.7* 7.5*   < > 9.0 9.5 9.6  MG 1.6* 2.2 1.9  --  1.7  --   --   PHOS 2.0*  --  2.5  --   --   --   --    < > = values in this interval not displayed.   Recent Labs    10/29/23 2325 10/31/23 0438 11/01/23 0631 11/15/23 0958  AST 11* 17 20 14   ALT 12 18 18 19   ALKPHOS 104 74 85  --   BILITOT 0.3 0.4 0.4 0.2  PROT 7.2 7.0 6.9 7.2  ALBUMIN 3.3* 2.6* 2.7*  --    Recent Labs    10/29/23 2325 10/31/23 0438 11/01/23 0631 11/15/23 0958 11/22/23 0813  WBC 10.3   < > 9.8 11.6* 12.8*  NEUTROABS 5.3  --   --  6,960 7,194  HGB 10.9*   < > 10.7* 12.1 11.3*  HCT 34.2*   < > 34.9* 40.6 36.9  MCV 79.7*   < > 81.9 84.8 83.7  PLT 412*   < > 384 349 341   < > = values in  this interval not displayed.   Lab Results  Component Value Date   TSH 1.13 10/29/2023   Lab Results  Component Value Date   HGBA1C 11.3 (H) 10/29/2023   Lab Results  Component Value Date   CHOL 178 10/29/2023   HDL 40 (L) 10/29/2023   LDLCALC  10/29/2023  Comment:     . LDL cholesterol not calculated. Triglyceride levels greater than 400 mg/dL invalidate calculated LDL results. . Reference range: <100 . Desirable range <100 mg/dL for primary prevention;   <70 mg/dL for patients with CHD or diabetic patients  with > or = 2 CHD risk factors. Marland Kitchen LDL-C is now calculated using the Martin-Hopkins  calculation, which is a validated novel method providing  better accuracy than the Friedewald equation in the  estimation of LDL-C.  Horald Pollen et al. Lenox Ahr. 8657;846(96): 2061-2068  (http://education.[facility].com/faq/FAQ164)    LDLDIRECT 64.0 03/26/2023   TRIG 417 (H) 10/29/2023   CHOLHDL 4.5 10/29/2023    Significant Diagnostic Results in last 30 days:  No results found.  Assessment/Plan  Leg Swelling and Pain Swelling and pain in legs, with the right leg larger than the left, associated with warmth and tenderness. Increased white blood cell count suggests possible infection. Cellulitis or another infection is considered, with a history of cellulitis and necrotizing fasciitis potentially contributing. Infection may be elevating blood glucose levels. - Prescribe doxycycline, one in the morning and one in the evening, to address the infection. - Refer to a vascular specialist for further evaluation on April 17th. - If symptoms do not improve after antibiotics, consider referral to a dermatologist.  Hyperglycemia Blood glucose levels range from 250 to 400 mg/dL despite insulin and metformin. Unable to access Ozempic or Wegovy due to insurance issues. Infection may be contributing to hyperglycemia. Endocrinology follow-up is necessary to explore alternative medications or  treatment adjustments. - Continue current insulin regimen and metformin. - Encourage follow-up with an endocrinologist to explore alternative medications or adjustments to current treatment.  Anemia Hemoglobin decreased from 12.1 to 11.3, indicating anemia. Advised to consume iron-rich foods to improve iron levels. - Encourage dietary intake of iron-rich foods such as black beans, beets, and liver.  General Health Maintenance Advised to consume yogurt with low sugar content to maintain gut health while on antibiotics. - Advise consumption of low-sugar yogurt to maintain gut health while on antibiotics.  Follow-up Follow-up in three months for further evaluation and management. - Schedule follow-up appointment in three months.   Family/ staff Communication: Reviewed plan of care with patient verbalized understanding   Labs/tests ordered: None   Next Appointment: Return if symptoms worsen or fail to improve.   Total time: 30 minutes. Greater than 50% of total time spent doing patient education regarding eft leg pain and swelling,Anemia,Hyperglycemia, health maintenance including symptom/medication management.   Caesar Bookman, NP

## 2023-12-23 NOTE — Telephone Encounter (Signed)
 Noted.

## 2023-12-27 ENCOUNTER — Other Ambulatory Visit: Payer: Self-pay

## 2023-12-27 DIAGNOSIS — I739 Peripheral vascular disease, unspecified: Secondary | ICD-10-CM

## 2024-01-02 NOTE — Progress Notes (Unsigned)
 Office Note     CC:  RLE swelling and diminished pulses Requesting Provider:  Ngetich, Elijio [place]adel[place], NP  HPI: Emmett Zimmerman is a 38 y.o. ([birth year]) female presenting at the request of .Ngetich, [name], NP with right lower extremity swelling, diminished pulses.  On exam, Carolyn Zimmerman was doing well.  She has had a tough year, having previously been hospitalized for right lower extremity cellulitis, uncontrolled hypertension.  Since her hospitalization, she has appreciated bilateral lower extremity edema, right greater than left.  This extends into the toes.  She notes that when she increases her diuretics, the edema improves.  She notes accompanying numbness and tingling, specifically in the right leg which travels from the thigh into the anterior aspect of the calf.  She denies claudication, ischemic rest pain, tissue loss.  The skin is very sensitive to palpation.  No history of vascular or venous surgeries Patient has uncontrolled diabetes with last A1c 11.3, prior to that 12, 9 months ago.   Past Medical History:  Diagnosis Date   Abscess of chest wall 06/15/2017   Coronavirus infection 05/02/2020   CVA (cerebral vascular accident) (HCC) 07/14/2022   L MCA CVA, watershed infarct   Diabetes mellitus    Heart failure with mildly reduced ejection fraction (HFmrEF) (HCC) 09/29/2021   EF 45%, moderate LVH   Hyperlipidemia associated with type 2 diabetes mellitus (HCC)    Hypertension    Jaundice    Obesity    TOA (tubo-ovarian abscess) 09/2019    Past Surgical History:  Procedure Laterality Date   CHOLECYSTECTOMY     IRRIGATION AND DEBRIDEMENT ABSCESS N/A 05/02/2020   Procedure: IRRIGATION AND DEBRIDEMENT ABDOMINAL and CHEST WALL NECROTIZING FASCITITS;  Surgeon: Candyce Champagne, MD;  Location: [facility];  Service: General;  Laterality: N/A;   liver stent      Social History   Socioeconomic History   Marital status: Single    Spouse name: Not on file   Number of children: Not on file    Years of education: Not on file   Highest education level: GED or equivalent  Occupational History   Not on file  Tobacco Use   Smoking status: Some Days    Current packs/day: 0.50    Types: Cigarettes   Smokeless tobacco: Never  Vaping Use   Vaping status: Former  Substance and Sexual Activity   Alcohol use: Not Currently    Comment: occ   Drug use: No   Sexual activity: Not on file  Other Topics Concern   Not on file  Social History Narrative   Are you right handed or left handed? right   Are you currently employed ? yes   What is your current occupation?[employer]   Do you live at home alone?with kids   Who lives with you?    What type of home do you live in: 1 story or 2 story? two    Caffeine 1-3 a day   Social Drivers of Corporate investment banker Strain: High Risk (10/29/2023)   Overall Financial Resource Strain (CARDIA)    Difficulty of Paying Living Expenses: Very hard  Food Insecurity: Food Insecurity Present (11/05/2023)   Hunger Vital Sign    Worried About Running Out of Food in the Last Year: Sometimes true    Ran Out of Food in the Last Year: Sometimes true  Transportation Needs: No Transportation Needs (11/02/2023)   PRAPARE - Transportation    Lack of Transportation (Medical): No    Lack of  Transportation (Non-Medical): No  Physical Activity: Unknown (10/29/2023)   Exercise Vital Sign    Days of Exercise per Week: 0 days    Minutes of Exercise per Session: Not on file  Stress: Stress Concern Present (10/29/2023)   [employer] of Occupational Health - Occupational Stress Questionnaire    Feeling of Stress : To some extent  Social Connections: Socially Isolated (10/29/2023)   Social Connection and Isolation Panel [NHANES]    Frequency of Communication with Friends and Family: Twice a week    Frequency of Social Gatherings with Friends and Family: Never    Attends Religious Services: 1 to 4 times per year    Active Member of [employer] or Organizations: No     Attends Engineer, structural: Not on file    Marital Status: Separated  Intimate Partner Violence: Not At Risk (10/30/2023)   Humiliation, Afraid, Rape, and Kick questionnaire    Fear of Current or Ex-Partner: No    Emotionally Abused: No    Physically Abused: No    Sexually Abused: No   Family History  Problem Relation Age of Onset   Hypertension Mother    Diabetes Father    Hypertension Father     Current Outpatient Medications  Medication Sig Dispense Refill   albuterol (VENTOLIN HFA) 108 (90 Base) MCG/ACT inhaler Inhale 2 puffs into the lungs every 4 (four) hours as needed for wheezing or shortness of breath. 18 g 0   ALEVE 220 MG tablet Take 220-1,100 mg by mouth daily as needed (for headaches or mild pain).     amLODipine (NORVASC) 10 MG tablet Take 1 tablet (10 mg total) by mouth daily. 90 tablet 0   aspirin 81 MG chewable tablet Chew 1 tablet (81 mg total) by mouth daily. 90 tablet 1   carvedilol (COREG) 25 MG tablet Take 1 tablet (25 mg total) by mouth 2 (two) times daily. 180 tablet 0   hydrochlorothiazide (HYDRODIURIL) 25 MG tablet Take 1 tablet (25 mg total) by mouth daily. 90 tablet 0   Insulin Glargine Solostar (LANTUS) 100 UNIT/ML Solostar Pen Inject 30 Units into the skin daily.     insulin lispro (HUMALOG) 100 UNIT/ML KwikPen Inject 16 Units into the skin 3 (three) times daily.     Insulin Pen Needle 31G X 6 MM MISC use to inject insulin 100 each 0   metFORMIN (GLUCOPHAGE-XR) 500 MG 24 hr tablet Take 2 tablets (1,000 mg total) by mouth in the morning and at bedtime. 360 tablet 0   rosuvastatin (CRESTOR) 20 MG tablet Take 1 tablet (20 mg total) by mouth daily. 90 tablet 0   spironolactone (ALDACTONE) 50 MG tablet Take 1 tablet (50 mg total) by mouth daily. 90 tablet 0   torsemide (DEMADEX) 20 MG tablet Take 2 tablets (40 mg total) by mouth daily. 180 tablet 0   valsartan (DIOVAN) 320 MG tablet Take 1 tablet (320 mg total) by mouth daily. 90 tablet 0   No  current facility-administered medications for this visit.    Allergies  Allergen Reactions   Contrast Media [Iodinated Contrast Media] Hives and Swelling     REVIEW OF SYSTEMS:  [X]  denotes positive finding, [ ]  denotes negative finding Cardiac  Comments:  Chest pain or chest pressure:    Shortness of breath upon exertion:    Short of breath when lying flat:    Irregular heart rhythm:        Vascular    Pain in calf, thigh, or  hip brought on by ambulation:    Pain in feet at night that wakes you up from your sleep:     Blood clot in your veins:    Leg swelling:         Pulmonary    Oxygen at home:    Productive cough:     Wheezing:         Neurologic    Sudden weakness in arms or legs:     Sudden numbness in arms or legs:     Sudden onset of difficulty speaking or slurred speech:    Temporary loss of vision in one eye:     Problems with dizziness:         Gastrointestinal    Blood in stool:     Vomited blood:         Genitourinary    Burning when urinating:     Blood in urine:        Psychiatric    Major depression:         Hematologic    Bleeding problems:    Problems with blood clotting too easily:        Skin    Rashes or ulcers:        Constitutional    Fever or chills:      PHYSICAL EXAMINATION:  There were no vitals filed for this visit.  General:  WDWN in NAD; vital signs documented above Gait: Not observed HENT: WNL, normocephalic Pulmonary: normal non-labored breathing , without wheezing Cardiac: regular HR Abdomen: soft, NT, no masses Skin: without rashes Vascular Exam/Pulses:  Right Left  Radial 2+ (normal) 2+ (normal)  Ulnar    Femoral    Popliteal    DP 2+ (normal) 2+ (normal)  PT     Extremities: without ischemic changes, without Gangrene , without cellulitis; without open wounds;  Bilateral lower extremity edema, right greater than left.  2+. Musculoskeletal: no muscle wasting or atrophy  Neurologic: A&O X 3;  No focal  weakness or paresthesias are detected Psychiatric:  The pt has Normal affect.   Non-Invasive Vascular Imaging:   DVT study negative   ABI Findings:  +---------+------------------+-----+---------+--------+  Right   Rt Pressure (mmHg)IndexWaveform Comment   +---------+------------------+-----+---------+--------+  Brachial 182                                       +---------+------------------+-----+---------+--------+  PTA     203               1.12 triphasic          +---------+------------------+-----+---------+--------+  DP      217               1.19 triphasic          +---------+------------------+-----+---------+--------+  Great Toe156               0.86                    +---------+------------------+-----+---------+--------+   +---------+------------------+-----+---------+-------+  Left    Lt Pressure (mmHg)IndexWaveform Comment  +---------+------------------+-----+---------+-------+  Brachial 174                                      +---------+------------------+-----+---------+-------+  PTA     174  0.96 triphasic         +---------+------------------+-----+---------+-------+  DP      182               1.00 triphasic         +---------+------------------+-----+---------+-------+  Great Toe170               0.93                   +---------+------------------+-----+---------+-------+   +-------+-----------+-----------+------------+------------+  ABI/TBIToday's ABIToday's TBIPrevious ABIPrevious TBI  +-------+-----------+-----------+------------+------------+  Right 1.19       0.86                                 +-------+-----------+-----------+------------+------------+  Left  1.00       0.93                                 +-------+-----------+-----------+------------+------------+    ASSESSMENT/PLAN: Rossi Silvestro is a 38 y.o. female presenting with bilateral lower extremity  edema, right greater than left.  We had a long discussion regarding edema, and the multiple etiologies.  With recent cellulitis in the right lower extremity, my guess is that she has some secondary lymphedema due to sclerosis of lymphatic channels from the cellulitis. Other etiologies include heart failure with medication noncompliance.  She states that she sometimes increases her dose from the prescribed amount in an effort to help.  I cautioned her that this can affect her kidneys significantly and recommended that she discuss this with her primary before doing so. We also discussed that lower extremity venous disease can also lead to swelling.  I think that this is low on the differential as her swelling was acute however this can be ruled out with an ultrasound.  No significant telangiectasias, varicosities.   My plan is to call her in 2 to 3 weeks with the ultrasound to check on her symptoms, as well as informed her of the ultrasound results.  We discussed compression stockings.  My plan was to have her sized today in clinic, however she left prior to receiving a pair.  I plan to discuss this with her in 2 to 3 weeks when we talk again.    Kayla Part, MD [facility] [phone]

## 2024-01-03 ENCOUNTER — Encounter: Payer: Self-pay | Admitting: Vascular Surgery

## 2024-01-03 ENCOUNTER — Ambulatory Visit (HOSPITAL_COMMUNITY)
Admission: RE | Admit: 2024-01-03 | Discharge: 2024-01-03 | Disposition: A | Discharge status: Home / Self Care | Payer: Medicaid Other | Source: Ambulatory Visit | Attending: Vascular Surgery | Admitting: Vascular Surgery

## 2024-01-03 ENCOUNTER — Telehealth: Payer: Self-pay | Admitting: Pharmacist

## 2024-01-03 ENCOUNTER — Ambulatory Visit: Payer: Medicaid Other | Admitting: Vascular Surgery

## 2024-01-03 VITALS — BP 156/106 | HR 82 | Temp 98.0°F | Resp 20 | Ht 67.0 in | Wt 286.9 lb

## 2024-01-03 DIAGNOSIS — R600 Localized edema: Secondary | ICD-10-CM

## 2024-01-03 DIAGNOSIS — I739 Peripheral vascular disease, unspecified: Secondary | ICD-10-CM | POA: Insufficient documentation

## 2024-01-03 DIAGNOSIS — Z794 Long term (current) use of insulin: Secondary | ICD-10-CM

## 2024-01-03 LAB — VAS US ABI WITH/WO TBI
Left ABI: 1
Right ABI: 1.19

## 2024-01-03 MED ORDER — INSULIN LISPRO (1 UNIT DIAL) 100 UNIT/ML (KWIKPEN)
22.0000 [IU] | PEN_INJECTOR | Freq: Three times a day (TID) | SUBCUTANEOUS | 1 refills | Status: DC
  Filled 2024-01-03: qty 30, 45d supply, fill #0

## 2024-01-03 MED ORDER — INSULIN GLARGINE SOLOSTAR 100 UNIT/ML ~~LOC~~ SOPN
30.0000 [IU] | PEN_INJECTOR | Freq: Every day | SUBCUTANEOUS | 1 refills | Status: DC
  Filled 2024-01-03: qty 15, 50d supply, fill #0

## 2024-01-03 NOTE — Progress Notes (Signed)
 01/03/2024 Name: Carolyn Zimmerman MRN: [id number] DOB: 1985/10/17  Chief Complaint  Patient presents with   Medication Management    Diabetes     Carolyn Zimmerman is a 38 y.o. year old female who presented for a telephone visit.   They were referred to the pharmacist by their PCP for assistance in managing diabetes.    Subjective:  Care Team: Primary Care Provider: Ngetich, Donalee Citrin, NP ;    Current Pharmacy:  [facility] [address] [place] [address] Phone: [phone] Fax: [phone]  [facility] [address] [place] [address] Phone: [phone] Fax: [phone]   Patient reports affordability concerns with their medications: No  Patient reports access/transportation concerns to their pharmacy: Yes  Patient reports adherence concerns with their medications:  Yes  insulin --reported being out of both Lantus and Humalog for more than 1 week.   Diabetes:  Current medications:  Metformin 1000 mg 1 tablet twice daily Humalog 22 units three times daily Lantus 30 units daily  Patient reported her blood sugars have remained in the 400s. Which is totally understandable since she has not had her insulin in over 2 weeks.    Objective:  Lab Results  Component Value Date   HGBA1C 11.3 (H) 10/29/2023    Lab Results  Component Value Date   CREATININE 1.24 (H) 11/22/2023   BUN 25 11/22/2023   NA 133 (L) 11/22/2023   K 4.2 11/22/2023   CL 96 (L) 11/22/2023   CO2 28 11/22/2023    Lab Results  Component Value Date   CHOL 178 10/29/2023   HDL 40 (L) 10/29/2023   LDLCALC  10/29/2023     Comment:     . LDL cholesterol not calculated. Triglyceride levels greater than 400 mg/dL invalidate calculated LDL results. . Reference range: <100 . Desirable range <100 mg/dL for primary prevention;   <70 mg/dL for patients with CHD or diabetic patients  with > or = 2  CHD risk factors. Marland Kitchen LDL-C is now calculated using the Martin-Hopkins  calculation, which is a validated novel method providing  better accuracy than the Friedewald equation in the  estimation of LDL-C.  Horald Pollen et al. Lenox Ahr. 6301;601(09): 2061-2068  (http://education.[facility].com/faq/FAQ164)    LDLDIRECT 64.0 03/26/2023   TRIG 417 (H) 10/29/2023   CHOLHDL 4.5 10/29/2023    Medications Reviewed Today     Reviewed by Beecher Mcardle, [facility] (Pharmacist) on 01/03/24 at 1721  Med List Status: <None>   Medication Order Taking? Sig Documenting Provider Last Dose Status Informant  albuterol (VENTOLIN HFA) 108 (90 Base) MCG/ACT inhaler [id number] Yes Inhale 2 puffs into the lungs every 4 (four) hours as needed for wheezing or shortness of breath. Leatha Gilding, MD Taking Active   ALEVE 220 MG tablet [id number] Yes Take 220-1,100 mg by mouth daily as needed (for headaches or mild pain). [provider] Taking Active Self, Pharmacy Records  amLODipine (NORVASC) 10 MG tablet [id number] Yes Take 1 tablet (10 mg total) by mouth daily. Leatha Gilding, MD Taking Active   aspirin 81 MG chewable tablet [id number] Yes Chew 1 tablet (81 mg total) by mouth daily. Ngetich, Donalee Citrin, NP Taking Active Self, Pharmacy Records           Med Note Gildardo Griffes Nov 05, 2023  5:52 PM)    carvedilol (COREG) 25 MG tablet [id number] Yes Take 1  tablet (25 mg total) by mouth 2 (two) times daily. Gherghe, Costin M, MD Taking Active   hydrochlorothiazide (HYDRODIURIL) 25 MG tablet [id number] Yes Take 1 tablet (25 mg total) by mouth daily. Gherghe, Costin M, MD Taking Active   Insulin Glargine Solostar (LANTUS) 100 UNIT/ML Solostar Pen [id number] Yes Inject 30 Units into the skin daily. Ngetich, [name], NP Taking Active   insulin lispro (HUMALOG) 100 UNIT/ML KwikPen [id number] Yes Inject 16 Units into the skin 3 (three) times daily. Ngetich, [name], NP Taking Active   Insulin Pen Needle 31G X 6 MM MISC  [id number] Yes use to inject insulin Gherghe, Costin M, MD Taking Active   metFORMIN (GLUCOPHAGE-XR) 500 MG 24 hr tablet [id number] Yes Take 2 tablets (1,000 mg total) by mouth in the morning and at bedtime. Gherghe, Costin M, MD Taking Active   rosuvastatin (CRESTOR) 20 MG tablet [id number] Yes Take 1 tablet (20 mg total) by mouth daily. Gherghe, Costin M, MD Taking Active   spironolactone (ALDACTONE) 50 MG tablet [id number] Yes Take 1 tablet (50 mg total) by mouth daily. Gherghe, Costin M, MD Taking Active   torsemide (DEMADEX) 20 MG tablet [id number] Yes Take 2 tablets (40 mg total) by mouth daily. Gherghe, Costin M, MD Taking Active   valsartan (DIOVAN) 320 MG tablet [id number] Yes Take 1 tablet (320 mg total) by mouth daily. Osborn Blaze, MD Taking Active               Assessment/Plan:   Diabetes: - Currently uncontrolled -History of pancreatitis - Reviewed long term cardiovascular and renal outcomes of uncontrolled blood sugar - Reviewed goal A1c, goal fasting, and goal 2 hour post prandial glucose - Refills of Humalog 22 units three times daily (patient reported dose) and Lantus 30 units daily were sent for co-signature    Follow Up Plan:    Lantus dose needs to be titrated but will focus on getting patient back on consistent therapy. Note sent to Provider but it was after hours when I spoke to the patient. I will follow up in the morning to make sure she has her insulin.   Geronimo Krabbe, PharmD, BCACP Clinical Pharmacist [phone]

## 2024-01-04 ENCOUNTER — Telehealth: Payer: Self-pay | Admitting: Pharmacist

## 2024-01-04 ENCOUNTER — Other Ambulatory Visit: Payer: Self-pay

## 2024-01-04 DIAGNOSIS — E1165 Type 2 diabetes mellitus with hyperglycemia: Secondary | ICD-10-CM

## 2024-01-04 NOTE — Telephone Encounter (Signed)
 Sung Engels Pharmacist called asking why if the Lantus  and Humalog  were ordered why does  it show that the pharmacy did not receive it. E2C2 NT cannot reordered these meds. Sending back to office.

## 2024-01-04 NOTE — Progress Notes (Signed)
   01/04/2024  Patient ID: Carolyn Zimmerman, female   DOB: 09-25-1985, 38 y.o.   MRN: [id number]  Reviewed chart to follow up on Lantus  and Humalog  refills.  Patient did not answer her phone. HIPAA compliant message was left on her voicemail.  Reviewed chart. Could not see where the Humalpg and Lantus  prescriptions were actually transmitted to the Pharmacy.  Provider's office was called. Their office was closed.  I spoke with the Triage Nurse who said she would verbally call in to the Pharmacy.  Patient called me back. Communicated understanding and said she would check with the pharmacy in 45 min - 1 hour.   Plan: Follow up with the Patient on Monday.  Cassius DOROTHA Brought, PharmD, BCACP Clinical Pharmacist [phone]

## 2024-01-05 ENCOUNTER — Emergency Department (HOSPITAL_BASED_OUTPATIENT_CLINIC_OR_DEPARTMENT_OTHER): Admitting: Radiology

## 2024-01-05 ENCOUNTER — Observation Stay (HOSPITAL_BASED_OUTPATIENT_CLINIC_OR_DEPARTMENT_OTHER)
Admission: EM | Admit: 2024-01-05 | Discharge: 2024-01-08 | Disposition: A | Discharge status: Home / Self Care | Attending: Internal Medicine | Admitting: Internal Medicine

## 2024-01-05 ENCOUNTER — Encounter (HOSPITAL_BASED_OUTPATIENT_CLINIC_OR_DEPARTMENT_OTHER): Payer: Self-pay

## 2024-01-05 ENCOUNTER — Other Ambulatory Visit: Payer: Self-pay

## 2024-01-05 DIAGNOSIS — E1165 Type 2 diabetes mellitus with hyperglycemia: Secondary | ICD-10-CM | POA: Diagnosis not present

## 2024-01-05 DIAGNOSIS — Z7901 Long term (current) use of anticoagulants: Secondary | ICD-10-CM | POA: Diagnosis not present

## 2024-01-05 DIAGNOSIS — Z794 Long term (current) use of insulin: Secondary | ICD-10-CM | POA: Insufficient documentation

## 2024-01-05 DIAGNOSIS — R9431 Abnormal electrocardiogram [ECG] [EKG]: Secondary | ICD-10-CM | POA: Diagnosis present

## 2024-01-05 DIAGNOSIS — Z6841 Body Mass Index (BMI) 40.0 and over, adult: Secondary | ICD-10-CM | POA: Diagnosis not present

## 2024-01-05 DIAGNOSIS — Z87891 Personal history of nicotine dependence: Secondary | ICD-10-CM | POA: Insufficient documentation

## 2024-01-05 DIAGNOSIS — F419 Anxiety disorder, unspecified: Secondary | ICD-10-CM | POA: Diagnosis not present

## 2024-01-05 DIAGNOSIS — R739 Hyperglycemia, unspecified: Secondary | ICD-10-CM

## 2024-01-05 DIAGNOSIS — I5042 Chronic combined systolic (congestive) and diastolic (congestive) heart failure: Secondary | ICD-10-CM | POA: Diagnosis not present

## 2024-01-05 DIAGNOSIS — U071 COVID-19: Principal | ICD-10-CM | POA: Diagnosis present

## 2024-01-05 DIAGNOSIS — E782 Mixed hyperlipidemia: Secondary | ICD-10-CM | POA: Diagnosis present

## 2024-01-05 DIAGNOSIS — I10 Essential (primary) hypertension: Secondary | ICD-10-CM | POA: Diagnosis present

## 2024-01-05 DIAGNOSIS — Z79899 Other long term (current) drug therapy: Secondary | ICD-10-CM | POA: Diagnosis not present

## 2024-01-05 DIAGNOSIS — E66813 Obesity, class 3: Secondary | ICD-10-CM | POA: Diagnosis not present

## 2024-01-05 DIAGNOSIS — I110 Hypertensive heart disease with heart failure: Secondary | ICD-10-CM | POA: Insufficient documentation

## 2024-01-05 DIAGNOSIS — R0602 Shortness of breath: Secondary | ICD-10-CM | POA: Diagnosis present

## 2024-01-05 DIAGNOSIS — R54 Age-related physical debility: Secondary | ICD-10-CM | POA: Insufficient documentation

## 2024-01-05 DIAGNOSIS — Z720 Tobacco use: Secondary | ICD-10-CM | POA: Diagnosis present

## 2024-01-05 DIAGNOSIS — I4581 Long QT syndrome: Secondary | ICD-10-CM | POA: Diagnosis not present

## 2024-01-05 DIAGNOSIS — Z8673 Personal history of transient ischemic attack (TIA), and cerebral infarction without residual deficits: Secondary | ICD-10-CM | POA: Diagnosis not present

## 2024-01-05 DIAGNOSIS — I5022 Chronic systolic (congestive) heart failure: Secondary | ICD-10-CM | POA: Diagnosis present

## 2024-01-05 LAB — BRAIN NATRIURETIC PEPTIDE: B Natriuretic Peptide: 59.2 pg/mL (ref 0.0–100.0)

## 2024-01-05 LAB — CBC WITH DIFFERENTIAL/PLATELET
Abs Immature Granulocytes: 0.04 10*3/uL (ref 0.00–0.07)
Basophils Absolute: 0 10*3/uL (ref 0.0–0.1)
Basophils Relative: 0 %
Eosinophils Absolute: 0.1 10*3/uL (ref 0.0–0.5)
Eosinophils Relative: 1 %
HCT: 32.4 % — ABNORMAL LOW (ref 36.0–46.0)
Hemoglobin: 10.8 g/dL — ABNORMAL LOW (ref 12.0–15.0)
Immature Granulocytes: 1 %
Lymphocytes Relative: 34 %
Lymphs Abs: 2.8 10*3/uL (ref 0.7–4.0)
MCH: 27 pg (ref 26.0–34.0)
MCHC: 33.3 g/dL (ref 30.0–36.0)
MCV: 81 fL (ref 80.0–100.0)
Monocytes Absolute: 1 10*3/uL (ref 0.1–1.0)
Monocytes Relative: 11 %
Neutro Abs: 4.5 10*3/uL (ref 1.7–7.7)
Neutrophils Relative %: 53 %
Platelets: 286 10*3/uL (ref 150–400)
RBC: 4 MIL/uL (ref 3.87–5.11)
RDW: 14.4 % (ref 11.5–15.5)
WBC: 8.4 10*3/uL (ref 4.0–10.5)
nRBC: 0 % (ref 0.0–0.2)

## 2024-01-05 LAB — BASIC METABOLIC PANEL WITH GFR
Anion gap: 9 (ref 5–15)
BUN: 17 mg/dL (ref 6–20)
CO2: 26 mmol/L (ref 22–32)
Calcium: 9.4 mg/dL (ref 8.9–10.3)
Chloride: 91 mmol/L — ABNORMAL LOW (ref 98–111)
Creatinine, Ser: 1.19 mg/dL — ABNORMAL HIGH (ref 0.44–1.00)
GFR, Estimated: >60 mL/min (ref >60)
Glucose, Bld: 529 mg/dL (ref 70–99)
Potassium: 4 mmol/L (ref 3.5–5.1)
Sodium: 126 mmol/L — ABNORMAL LOW (ref 135–145)

## 2024-01-05 LAB — RESP PANEL BY RT-PCR (RSV, FLU A&B, COVID)  RVPGX2
Influenza A by PCR: NEGATIVE
Influenza B by PCR: NEGATIVE
Resp Syncytial Virus by PCR: NEGATIVE
SARS Coronavirus 2 by RT PCR: POSITIVE — AB

## 2024-01-05 LAB — HCG, QUANTITATIVE, PREGNANCY: hCG, Beta Chain, Quant, S: <1 m[IU]/mL (ref <5)

## 2024-01-05 LAB — CBG MONITORING, ED: Glucose-Capillary: 545 mg/dL (ref 70–99)

## 2024-01-05 MED ORDER — MORPHINE SULFATE (PF) 4 MG/ML IV SOLN
4.0000 mg | Freq: Once | INTRAVENOUS | Status: AC
  Administered 2024-01-05: 4 mg via INTRAVENOUS
  Filled 2024-01-05: qty 1

## 2024-01-05 MED ORDER — INSULIN ASPART 100 UNIT/ML IJ SOLN
10.0000 [IU] | Freq: Once | INTRAMUSCULAR | Status: AC
  Administered 2024-01-05: 10 [IU] via SUBCUTANEOUS

## 2024-01-05 MED ORDER — INSULIN ASPART 100 UNIT/ML IJ SOLN
10.0000 [IU] | Freq: Once | INTRAMUSCULAR | Status: DC
  Administered 2024-01-06: 10 [IU] via SUBCUTANEOUS

## 2024-01-05 MED ORDER — SODIUM CHLORIDE 0.9 % IV BOLUS
500.0000 mL | Freq: Once | INTRAVENOUS | Status: AC
  Administered 2024-01-06: 500 mL via INTRAVENOUS

## 2024-01-05 NOTE — ED Triage Notes (Signed)
 Pt reports she is here today due to sob. Pt reports at first she thought it was her allergies. Pt reports the sob didn't get any better. Pt reports h/o stroke, DM type 1, hypertension

## 2024-01-05 NOTE — ED Provider Notes (Signed)
 [place] EMERGENCY DEPARTMENT AT [facility] Provider Note   CSN: [id number] Arrival date & time: 01/05/24  2005     History  Chief Complaint  Patient presents with   Shortness of Breath    Carolyn Zimmerman is a 38 y.o. female.  This is a Dr.  The history is provided by the patient and medical records. No language interpreter was used.  Shortness of Breath    38 year old female with multiple comorbidities which includes uncontrolled diabetes, uncontrolled hypertension, CHF, lower recurrent cellulitis, obesity, prior stroke presenting with complaints of shortness of breath.  Patient states for the past 2 weeks she has had progressive worsening shortness of breath.  Shortness of breath is with laying down and with exertion.  She noticed that she is retaining more fluid in both of her legs.  She endorsed initially a dry cough but now slightly productive with sputum.  She complaining of bilateral leg pain from the swelling.  States she has recurrent cellulitis in her legs since January for which she takes doxycycline  for it.  She does not endorse any fever but does endorse chills and body aches.  No  runny nose or sneezing.  No prior history of PE or DVT.  She has increased her torsemide  to help with her fluid retention without adequate relief.  Home Medications Prior to Admission medications   Medication Sig Start Date End Date Taking? Authorizing Provider  albuterol  (VENTOLIN  HFA) 108 (90 Base) MCG/ACT inhaler Inhale 2 puffs into the lungs every 4 (four) hours as needed for wheezing or shortness of breath. 11/02/23   Gherghe, Costin M, MD  ALEVE  220 MG tablet Take 220-1,100 mg by mouth daily as needed (for headaches or mild pain).    [provider]  amLODipine  (NORVASC ) 10 MG tablet Take 1 tablet (10 mg total) by mouth daily. 11/02/23   Gherghe, Costin M, MD  aspirin  81 MG chewable tablet Chew 1 tablet (81 mg total) by mouth daily. 10/29/23   Ngetich, [name], NP   carvedilol  (COREG ) 25 MG tablet Take 1 tablet (25 mg total) by mouth 2 (two) times daily. 11/02/23   Gherghe, Costin M, MD  hydrochlorothiazide  (HYDRODIURIL ) 25 MG tablet Take 1 tablet (25 mg total) by mouth daily. 11/02/23 01/31/24  Gherghe, Costin M, MD  Insulin  Glargine Solostar (LANTUS ) 100 UNIT/ML Solostar Pen Inject 30 Units into the skin daily. 01/03/24   Ngetich, [name], NP  insulin  lispro (HUMALOG ) 100 UNIT/ML KwikPen Inject 22 Units into the skin 3 (three) times daily. 01/03/24   Ngetich, [name], NP  Insulin  Pen Needle 31G X 6 MM MISC use to inject insulin  11/02/23   Gherghe, Costin M, MD  metFORMIN  (GLUCOPHAGE -XR) 500 MG 24 hr tablet Take 2 tablets (1,000 mg total) by mouth in the morning and at bedtime. 11/02/23   Gherghe, Costin M, MD  rosuvastatin  (CRESTOR ) 20 MG tablet Take 1 tablet (20 mg total) by mouth daily. 11/02/23   Gherghe, Costin M, MD  spironolactone  (ALDACTONE ) 50 MG tablet Take 1 tablet (50 mg total) by mouth daily. 11/02/23   Gherghe, Costin M, MD  torsemide  (DEMADEX ) 20 MG tablet Take 2 tablets (40 mg total) by mouth daily. 11/02/23 01/31/24  Gherghe, Costin M, MD  valsartan  (DIOVAN ) 320 MG tablet Take 1 tablet (320 mg total) by mouth daily. 11/02/23   Gherghe, Costin M, MD      Allergies    Contrast media [iodinated contrast media]    Review of Systems  Review of Systems  Respiratory:  Positive for shortness of breath.   All other systems reviewed and are negative.   Physical Exam Updated Vital Signs BP (!) 151/103   Pulse 83   Temp 98.3 F (36.8 C) (Oral)   Resp (!) 23   LMP 12/31/2023   SpO2 100%  Physical Exam Vitals and nursing note reviewed.  Constitutional:      General: She is not in acute distress.    Appearance: She is well-developed. She is obese.  HENT:     Head: Atraumatic.  Eyes:     Conjunctiva/sclera: Conjunctivae normal.  Cardiovascular:     Rate and Rhythm: Normal rate and regular rhythm.     Pulses: Normal pulses.     Heart sounds:  Normal heart sounds.  Pulmonary:     Effort: Pulmonary effort is normal.     Breath sounds: No wheezing, rhonchi or rales.     Comments: Tachypnea Abdominal:     Palpations: Abdomen is soft.  Musculoskeletal:     Cervical back: Neck supple.     Right lower leg: Edema present.     Left lower leg: Edema present.  Skin:    Findings: No rash.  Neurological:     Mental Status: She is alert and oriented to person, place, and time.  Psychiatric:        Mood and Affect: Mood normal.     ED Results / Procedures / Treatments   Labs (all labs ordered are listed, but only abnormal results are displayed) Labs Reviewed  RESP PANEL BY RT-PCR (RSV, FLU A&B, COVID)  RVPGX2 - Abnormal; Notable for the following components:      Result Value   SARS Coronavirus 2 by RT PCR POSITIVE (*)    All other components within normal limits  BASIC METABOLIC PANEL WITH GFR - Abnormal; Notable for the following components:   Sodium 126 (*)    Chloride 91 (*)    Glucose, Bld 529 (*)    Creatinine, Ser 1.19 (*)    All other components within normal limits  CBC WITH DIFFERENTIAL/PLATELET - Abnormal; Notable for the following components:   Hemoglobin 10.8 (*)    HCT 32.4 (*)    All other components within normal limits  CBG MONITORING, ED - Abnormal; Notable for the following components:   Glucose-Capillary 545 (*)    All other components within normal limits  HCG, QUANTITATIVE, PREGNANCY  BRAIN NATRIURETIC PEPTIDE    EKG EKG Interpretation Date/Time:  Saturday January 05 2024 20:16:16 EDT Ventricular Rate:  83 PR Interval:  171 QRS Duration:  112 QT Interval:  424 QTC Calculation: 499 R Axis:   39  Text Interpretation: Sinus rhythm Probable left atrial enlargement Borderline intraventricular conduction delay Abnormal T, consider ischemia, lateral leads Borderline ST elevation, anterior leads Prolonged QT interval No significant change since last tracing Confirmed by Florette Hurry [phone]) on 01/05/2024  9:23:03 PM  Radiology DG Chest 2 View Result Date: 01/05/2024 CLINICAL DATA:  Shortness of breath EXAM: CHEST - 2 VIEW COMPARISON:  10/30/2023 FINDINGS: Stable cardiomediastinal silhouette. No focal consolidation, pleural effusion, or pneumothorax. No displaced rib fractures. IMPRESSION: No active cardiopulmonary disease. Electronically Signed   By: Rozell Cornet M.D.   On: 01/05/2024 21:27    Procedures Procedures    Medications Ordered in ED Medications  insulin  aspart (novoLOG ) injection 10 Units (10 Units Subcutaneous Given 01/05/24 2149)  morphine  (PF) 4 MG/ML injection 4 mg (4 mg Intravenous Given 01/05/24 2311)  ED Course/ Medical Decision Making/ A&P                                 Medical Decision Making Amount and/or Complexity of Data Reviewed Labs: ordered. Radiology: ordered.  Risk Prescription drug management.   BP (!) 162/96   Pulse 82   Temp 98.3 F (36.8 C) (Oral)   Resp (!) 21   LMP 12/31/2023   SpO2 98%   59:58 PM  38 year old female with multiple comorbidities which includes uncontrolled diabetes, uncontrolled hypertension, CHF, lower recurrent cellulitis, obesity, prior stroke presenting with complaints of shortness of breath.  Patient states for the past 2 weeks she has had progressive worsening shortness of breath.  Shortness of breath is with laying down and with exertion.  She noticed that she is retaining more fluid in both of her legs.  She endorsed initially a dry cough but now slightly productive with sputum.  She complaining of bilateral leg pain from the swelling.  States she has recurrent cellulitis in her legs since January for which she takes doxycycline  for it.  She does not endorse any fever or chills.  No  runny nose or sneezing.  No prior history of PE or DVT.  She has increased her torsemide  to help with her fluid retention without adequate relief.  On exam, this is an obese female appearing tachypneic but not tachycardic.  No  significant rales heard, no JVD noted.  Patient does have trace edema to bilateral lower extremities without signs of cellulitis.  Legs are diffusely tender with gentle palpation.  Leg compartments soft.  -Labs ordered, independently viewed and interpreted by me.  Labs remarkable for covid positive.  Furthermore, patient has elevated CBG of 529 without anion gap to suggest DKA.  Sodium of 126 likely pseudohyponatremia.  Patient admits she does not have her insulin  for the past 2 weeks.  She has normal BNP, normal WBC -The patient was maintained on a cardiac monitor.  I personally viewed and interpreted the cardiac monitored which showed an underlying rhythm of: NSR -Imaging independently viewed and interpreted by me and I agree with radiologist's interpretation.  Result remarkable for chest x-ray unremarkable -This patient presents to the ED for concern of shortness of breath, this involves an extensive number of treatment options, and is a complaint that carries with it a high risk of complications and morbidity.  The differential diagnosis includes COVID, flu, RSV, pneumonia, anemia, CHF exacerbation, PE -Co morbidities that complicate the patient evaluation includes poorly controlled diabetes, hypertension, CHF, obesity -Treatment includes insulin , morphine  -Reevaluation of the patient after these medicines showed that the patient improved -PCP office notes or outside notes reviewed -Discussion with specialist [facility] Dr. Franklin Ito who agrees to admit pt -Escalation to admission/observation considered: patient is agreeable with admission  Patient has not taken her insulin  for the past 2 weeks because she no longer have her medication despite asking her doctor for medication refill.  Her blood sugar is elevated in the 500s.  Due to underlying CHF, I am afraid to give too much IV fluid as it may exacerbate her shortness of breath and his CHF.  Patient received 10 units of insulin  with no  significant improvement of her elevated blood sugar.  In the setting of COVID infection,         Final Clinical Impression(s) / ED Diagnoses Final diagnoses:  COVID-19 virus infection  Hyperglycemia  Rx / DC Orders ED Discharge Orders     None         Debbra Fairy, PA-C 01/06/24 0001    Dalene Duck, MD 01/06/24 [phone]

## 2024-01-06 DIAGNOSIS — Z794 Long term (current) use of insulin: Secondary | ICD-10-CM | POA: Diagnosis not present

## 2024-01-06 DIAGNOSIS — Z87891 Personal history of nicotine dependence: Secondary | ICD-10-CM | POA: Diagnosis not present

## 2024-01-06 DIAGNOSIS — R9431 Abnormal electrocardiogram [ECG] [EKG]: Secondary | ICD-10-CM | POA: Diagnosis present

## 2024-01-06 DIAGNOSIS — Z8673 Personal history of transient ischemic attack (TIA), and cerebral infarction without residual deficits: Secondary | ICD-10-CM | POA: Diagnosis not present

## 2024-01-06 DIAGNOSIS — Z6841 Body Mass Index (BMI) 40.0 and over, adult: Secondary | ICD-10-CM | POA: Diagnosis not present

## 2024-01-06 DIAGNOSIS — I5042 Chronic combined systolic (congestive) and diastolic (congestive) heart failure: Secondary | ICD-10-CM | POA: Diagnosis not present

## 2024-01-06 DIAGNOSIS — E782 Mixed hyperlipidemia: Secondary | ICD-10-CM | POA: Diagnosis not present

## 2024-01-06 DIAGNOSIS — I110 Hypertensive heart disease with heart failure: Secondary | ICD-10-CM | POA: Diagnosis not present

## 2024-01-06 DIAGNOSIS — Z79899 Other long term (current) drug therapy: Secondary | ICD-10-CM | POA: Diagnosis not present

## 2024-01-06 DIAGNOSIS — I4581 Long QT syndrome: Secondary | ICD-10-CM | POA: Diagnosis not present

## 2024-01-06 DIAGNOSIS — R0602 Shortness of breath: Secondary | ICD-10-CM | POA: Diagnosis present

## 2024-01-06 DIAGNOSIS — R54 Age-related physical debility: Secondary | ICD-10-CM | POA: Diagnosis not present

## 2024-01-06 DIAGNOSIS — E1165 Type 2 diabetes mellitus with hyperglycemia: Secondary | ICD-10-CM | POA: Diagnosis not present

## 2024-01-06 DIAGNOSIS — E66813 Obesity, class 3: Secondary | ICD-10-CM | POA: Diagnosis not present

## 2024-01-06 DIAGNOSIS — F419 Anxiety disorder, unspecified: Secondary | ICD-10-CM | POA: Diagnosis not present

## 2024-01-06 DIAGNOSIS — U071 COVID-19: Secondary | ICD-10-CM | POA: Diagnosis not present

## 2024-01-06 DIAGNOSIS — Z7901 Long term (current) use of anticoagulants: Secondary | ICD-10-CM | POA: Diagnosis not present

## 2024-01-06 LAB — GLUCOSE, CAPILLARY
Glucose-Capillary: 173 mg/dL — ABNORMAL HIGH (ref 70–99)
Glucose-Capillary: 248 mg/dL — ABNORMAL HIGH (ref 70–99)
Glucose-Capillary: 281 mg/dL — ABNORMAL HIGH (ref 70–99)
Glucose-Capillary: 330 mg/dL — ABNORMAL HIGH (ref 70–99)

## 2024-01-06 LAB — CBG MONITORING, ED
Glucose-Capillary: 259 mg/dL — ABNORMAL HIGH (ref 70–99)
Glucose-Capillary: 406 mg/dL — ABNORMAL HIGH (ref 70–99)
Glucose-Capillary: 470 mg/dL — ABNORMAL HIGH (ref 70–99)

## 2024-01-06 LAB — BASIC METABOLIC PANEL WITH GFR
Anion gap: 7 (ref 5–15)
BUN: 14 mg/dL (ref 6–20)
CO2: 27 mmol/L (ref 22–32)
Calcium: 9.2 mg/dL (ref 8.9–10.3)
Chloride: 100 mmol/L (ref 98–111)
Creatinine, Ser: 1.01 mg/dL — ABNORMAL HIGH (ref 0.44–1.00)
GFR, Estimated: >60 mL/min (ref >60)
Glucose, Bld: 260 mg/dL — ABNORMAL HIGH (ref 70–99)
Potassium: 3.7 mmol/L (ref 3.5–5.1)
Sodium: 134 mmol/L — ABNORMAL LOW (ref 135–145)

## 2024-01-06 LAB — MAGNESIUM: Magnesium: 1.9 mg/dL (ref 1.7–2.4)

## 2024-01-06 MED ORDER — INSULIN ASPART 100 UNIT/ML IJ SOLN
0.0000 [IU] | Freq: Three times a day (TID) | INTRAMUSCULAR | Status: DC
  Administered 2024-01-06: 3 [IU] via SUBCUTANEOUS
  Administered 2024-01-06: 7 [IU] via SUBCUTANEOUS
  Administered 2024-01-06: 11 [IU] via SUBCUTANEOUS
  Administered 2024-01-07: 20 [IU] via SUBCUTANEOUS
  Administered 2024-01-07: 11 [IU] via SUBCUTANEOUS

## 2024-01-06 MED ORDER — ROSUVASTATIN CALCIUM 10 MG PO TABS
20.0000 mg | ORAL_TABLET | Freq: Every day | ORAL | Status: DC
  Administered 2024-01-06 – 2024-01-08 (×3): 20 mg via ORAL
  Filled 2024-01-06 (×3): qty 2

## 2024-01-06 MED ORDER — ACETAMINOPHEN 650 MG RE SUPP: 650.0000 mg | Freq: Four times a day (QID) | RECTAL | Status: DC | PRN

## 2024-01-06 MED ORDER — HYDROCHLOROTHIAZIDE 25 MG PO TABS
25.0000 mg | ORAL_TABLET | Freq: Every day | ORAL | Status: DC
  Administered 2024-01-06 – 2024-01-08 (×3): 25 mg via ORAL
  Filled 2024-01-06 (×3): qty 1

## 2024-01-06 MED ORDER — SODIUM CHLORIDE 0.9 % IV SOLN
12.5000 mg | Freq: Four times a day (QID) | INTRAVENOUS | Status: DC | PRN
  Filled 2024-01-06: qty 0.5

## 2024-01-06 MED ORDER — SPIRONOLACTONE 25 MG PO TABS
50.0000 mg | ORAL_TABLET | Freq: Every day | ORAL | Status: DC
  Administered 2024-01-06 – 2024-01-08 (×3): 50 mg via ORAL
  Filled 2024-01-06 (×3): qty 2

## 2024-01-06 MED ORDER — INSULIN ASPART 100 UNIT/ML IJ SOLN: 16.0000 [IU] | Freq: Once | INTRAMUSCULAR | Status: DC

## 2024-01-06 MED ORDER — INSULIN ASPART 100 UNIT/ML IJ SOLN
7.0000 [IU] | Freq: Once | INTRAMUSCULAR | Status: AC
  Administered 2024-01-06: 7 [IU] via SUBCUTANEOUS

## 2024-01-06 MED ORDER — METFORMIN HCL ER 500 MG PO TB24
1000.0000 mg | ORAL_TABLET | Freq: Every day | ORAL | Status: DC
  Administered 2024-01-07 – 2024-01-08 (×2): 1000 mg via ORAL
  Filled 2024-01-06 (×2): qty 2

## 2024-01-06 MED ORDER — HYDROCODONE BIT-HOMATROP MBR 5-1.5 MG/5ML PO SOLN
5.0000 mL | Freq: Four times a day (QID) | ORAL | Status: DC | PRN
  Administered 2024-01-06 – 2024-01-07 (×2): 5 mL via ORAL
  Filled 2024-01-06 (×3): qty 5

## 2024-01-06 MED ORDER — ONDANSETRON HCL 4 MG/2ML IJ SOLN: 4.0000 mg | Freq: Four times a day (QID) | INTRAMUSCULAR | Status: DC | PRN

## 2024-01-06 MED ORDER — TORSEMIDE 20 MG PO TABS
40.0000 mg | ORAL_TABLET | Freq: Every day | ORAL | Status: DC
  Administered 2024-01-06 – 2024-01-08 (×3): 40 mg via ORAL
  Filled 2024-01-06 (×3): qty 2

## 2024-01-06 MED ORDER — GUAIFENESIN 100 MG/5ML PO LIQD: 5.0000 mL | ORAL | Status: DC | PRN

## 2024-01-06 MED ORDER — KETOROLAC TROMETHAMINE 30 MG/ML IJ SOLN
30.0000 mg | Freq: Once | INTRAMUSCULAR | Status: AC
Start: 2024-01-06 — End: 2024-01-06
  Administered 2024-01-06: 30 mg via INTRAVENOUS
  Filled 2024-01-06: qty 1

## 2024-01-06 MED ORDER — MAGNESIUM SULFATE 2 GM/50ML IV SOLN
2.0000 g | Freq: Once | INTRAVENOUS | Status: AC
  Administered 2024-01-06: 2 g via INTRAVENOUS
  Filled 2024-01-06: qty 50

## 2024-01-06 MED ORDER — POTASSIUM CHLORIDE CRYS ER 20 MEQ PO TBCR
40.0000 meq | EXTENDED_RELEASE_TABLET | Freq: Once | ORAL | Status: AC
  Administered 2024-01-06: 40 meq via ORAL
  Filled 2024-01-06: qty 2

## 2024-01-06 MED ORDER — CARVEDILOL 25 MG PO TABS
25.0000 mg | ORAL_TABLET | Freq: Two times a day (BID) | ORAL | Status: DC
  Administered 2024-01-06 – 2024-01-08 (×5): 25 mg via ORAL
  Filled 2024-01-06 (×5): qty 1

## 2024-01-06 MED ORDER — ACETAMINOPHEN 325 MG PO TABS
650.0000 mg | ORAL_TABLET | Freq: Four times a day (QID) | ORAL | Status: DC | PRN
  Administered 2024-01-06: 650 mg via ORAL
  Filled 2024-01-06: qty 2

## 2024-01-06 MED ORDER — SODIUM CHLORIDE 0.9 % IV SOLN: 12.5000 mg | Freq: Four times a day (QID) | INTRAVENOUS | Status: DC | PRN

## 2024-01-06 MED ORDER — ONDANSETRON HCL 4 MG PO TABS: 4.0000 mg | ORAL_TABLET | Freq: Four times a day (QID) | ORAL | Status: DC | PRN

## 2024-01-06 MED ORDER — INSULIN GLARGINE-YFGN 100 UNIT/ML ~~LOC~~ SOLN
30.0000 [IU] | Freq: Every day | SUBCUTANEOUS | Status: DC
  Administered 2024-01-06 (×2): 30 [IU] via SUBCUTANEOUS
  Filled 2024-01-06: qty 300
  Filled 2024-01-06 (×2): qty 0.3

## 2024-01-06 MED ORDER — PROCHLORPERAZINE EDISYLATE 10 MG/2ML IJ SOLN: 10.0000 mg | Freq: Four times a day (QID) | INTRAMUSCULAR | Status: DC | PRN

## 2024-01-06 MED ORDER — AMLODIPINE BESYLATE 10 MG PO TABS
10.0000 mg | ORAL_TABLET | Freq: Every day | ORAL | Status: DC
  Administered 2024-01-06 – 2024-01-08 (×3): 10 mg via ORAL
  Filled 2024-01-06 (×3): qty 1

## 2024-01-06 MED ORDER — INSULIN ASPART 100 UNIT/ML IJ SOLN
12.0000 [IU] | Freq: Once | INTRAMUSCULAR | Status: AC
  Administered 2024-01-06: 12 [IU] via SUBCUTANEOUS

## 2024-01-06 MED ORDER — ALBUTEROL SULFATE (2.5 MG/3ML) 0.083% IN NEBU: 2.5000 mg | INHALATION_SOLUTION | RESPIRATORY_TRACT | Status: DC | PRN

## 2024-01-06 MED ORDER — INSULIN ASPART 100 UNIT/ML IJ SOLN
0.0000 [IU] | Freq: Three times a day (TID) | INTRAMUSCULAR | Status: DC
Start: 2024-01-06 — End: 2024-01-06

## 2024-01-06 MED ORDER — IRBESARTAN 150 MG PO TABS
300.0000 mg | ORAL_TABLET | Freq: Every day | ORAL | Status: DC
  Administered 2024-01-06 – 2024-01-08 (×3): 300 mg via ORAL
  Filled 2024-01-06 (×3): qty 2

## 2024-01-06 MED ORDER — ACETAMINOPHEN 325 MG PO TABS
650.0000 mg | ORAL_TABLET | Freq: Four times a day (QID) | ORAL | Status: DC | PRN
  Administered 2024-01-06 – 2024-01-08 (×5): 650 mg via ORAL
  Filled 2024-01-06 (×5): qty 2

## 2024-01-06 NOTE — H&P (Signed)
 History and Physical    Patient: Carolyn Zimmerman GMW:[id number] DOB: 1986-06-21 DOA: 01/05/2024 DOS: the patient was seen and examined on 01/06/2024 PCP: Ngetich, Elijio [place], NP  Patient coming from: Home  Chief Complaint:  Chief Complaint  Patient presents with   Shortness of Breath   HPI: Carolyn Zimmerman is a 38 y.o. female with medical history significant of anxiety, depression, coronavirus infection, left MCA watershed CVA, hyperlipidemia, hypertension, hypertensive heart disease, chronic combined systolic heart failure with EF of 40 to 45% in June 2024, jaundice, class III obesity, tobacco use, history of acute pancreatitis, insulin -dependent type 2 diabetes, tubo-ovarian abscess, hidradenitis suppurativa, chest wall abscesses, stage I lymphedema, history of recurrent cellulitis of lower extremities, history of sepsis secondary to necrotizing fasciitis due to panniculitis with abdominal wall abscess requiring panniculectomy who presented the emergency department with complaints of almost 2 weeks of URI symptoms associated with mostly a nonproductive cough with occasional small sputum production, fatigue and malaise.  Positive chills and bodyaches, but no fevers.  She has been out of insulin  for the past 2 weeks.  The last time she took her medications was on Friday evening. No chest pain, palpitations, diaphoresis, PND, orthopnea or recent pitting edema of the lower extremities.  No abdominal pain, nausea, emesis, diarrhea, constipation, melena or hematochezia.  No flank pain, dysuria, frequency or hematuria.  No polyuria, polydipsia, polyphagia or blurred vision.   Lab work: Coronavirus PCR was positive, negative influenza and RSV.  CBC showed a white count of 8.4, hemoglobin 10.8 g/dL platelets 440.  0 pregnancy test was negative.  BMP showed sodium 126, potassium 4.0, chloride 91 and CO2 26 mmol/L.  Glucose 529, BUN 17, creatinine 1.19 and calcium  9.4 mg/dL.  BNP only 59.2 pg/mL.  Imaging: 2 view  chest radiograph with no active cardiopulmonary disease.   ED course: Initial vital signs were temperature 98.3 F, pulse 83, respiration 23, BP 151/103 mmHg and O2 sat 100% on room air.  The patient received morphine  4 mg IVP, normal saline 500 mL bolus, NovoLog  7 units SQ x 1 and then later NovoLog  12 units SQ x 1.  Review of Systems: As mentioned in the history of present illness. All other systems reviewed and are negative.  Past Medical History:  Diagnosis Date   Abscess of chest wall 06/15/2017   Coronavirus infection 05/02/2020   CVA (cerebral vascular accident) (HCC) 07/14/2022   L MCA CVA, watershed infarct   Diabetes mellitus    Heart failure with mildly reduced ejection fraction (HFmrEF) (HCC) 09/29/2021   EF 45%, moderate LVH   Hyperlipidemia associated with type 2 diabetes mellitus (HCC)    Hypertension    Jaundice    Obesity    TOA (tubo-ovarian abscess) 09/2019   Past Surgical History:  Procedure Laterality Date   CHOLECYSTECTOMY     IRRIGATION AND DEBRIDEMENT ABSCESS N/A 05/02/2020   Procedure: IRRIGATION AND DEBRIDEMENT ABDOMINAL and CHEST WALL NECROTIZING FASCITITS;  Surgeon: Candyce Champagne, MD;  Location: [facility];  Service: General;  Laterality: N/A;   liver stent     Social History:  reports that she quit smoking about 2 months ago. Her smoking use included cigarettes. She has never used smokeless tobacco. She reports that she does not currently use alcohol. She reports that she does not use drugs.  Allergies  Allergen Reactions   Contrast Media [Iodinated Contrast Media] Hives and Swelling    Family History  Problem Relation Age of Onset   Hypertension Mother    Diabetes  Father    Hypertension Father     Prior to Admission medications   Medication Sig Start Date End Date Taking? Authorizing Provider  albuterol  (VENTOLIN  HFA) 108 (90 Base) MCG/ACT inhaler Inhale 2 puffs into the lungs every 4 (four) hours as needed for wheezing or shortness of breath.  11/02/23  Yes Gherghe, Costin M, MD  ALEVE  220 MG tablet Take 220-1,100 mg by mouth daily as needed (for headaches or mild pain).   Yes [provider]  amLODipine  (NORVASC ) 10 MG tablet Take 1 tablet (10 mg total) by mouth daily. 11/02/23  Yes Gherghe, Costin M, MD  aspirin  81 MG chewable tablet Chew 1 tablet (81 mg total) by mouth daily. 10/29/23  Yes Ngetich, [name], NP  carvedilol  (COREG ) 25 MG tablet Take 1 tablet (25 mg total) by mouth 2 (two) times daily. 11/02/23  Yes Gherghe, Costin M, MD  hydrochlorothiazide  (HYDRODIURIL ) 25 MG tablet Take 1 tablet (25 mg total) by mouth daily. 11/02/23 01/31/24 Yes Gherghe, Costin M, MD  Insulin  Glargine Solostar (LANTUS ) 100 UNIT/ML Solostar Pen Inject 30 Units into the skin daily. 01/03/24  Yes Ngetich, [name], NP  insulin  lispro (HUMALOG ) 100 UNIT/ML KwikPen Inject 22 Units into the skin 3 (three) times daily. 01/03/24  Yes Ngetich, [name], NP  Insulin  Pen Needle 31G X 6 MM MISC use to inject insulin  11/02/23   Gherghe, Costin M, MD  metFORMIN  (GLUCOPHAGE -XR) 500 MG 24 hr tablet Take 2 tablets (1,000 mg total) by mouth in the morning and at bedtime. 11/02/23  Yes Gherghe, Costin M, MD  rosuvastatin  (CRESTOR ) 20 MG tablet Take 1 tablet (20 mg total) by mouth daily. 11/02/23  Yes Gherghe, Costin M, MD  spironolactone  (ALDACTONE ) 50 MG tablet Take 1 tablet (50 mg total) by mouth daily. 11/02/23  Yes Gherghe, Costin M, MD  torsemide  (DEMADEX ) 20 MG tablet Take 2 tablets (40 mg total) by mouth daily. 11/02/23 01/31/24 Yes Gherghe, Costin M, MD  valsartan  (DIOVAN ) 320 MG tablet Take 1 tablet (320 mg total) by mouth daily. 11/02/23  Yes Osborn Blaze, MD    Physical Exam: Vitals:   01/06/24 0430 01/06/24 0513 01/06/24 0559 01/06/24 0800  BP: 136/76 (!) 163/84    Pulse: 74 78    Resp: 18 18    Temp:  97.8 F (36.6 C)    TempSrc:  Oral    SpO2: 98% 100%  100%  Weight:  133 kg    Height:   5\' 7"  (1.702 m)    Physical Exam Vitals and nursing note  reviewed.  Constitutional:      General: She is awake. She is not in acute distress.    Appearance: She is well-developed. She is morbidly obese. She is ill-appearing.  HENT:     Head: Normocephalic.     Nose: No rhinorrhea.     Mouth/Throat:     Mouth: Mucous membranes are moist.  Eyes:     General: No scleral icterus.    Pupils: Pupils are equal, round, and reactive to light.  Neck:     Vascular: No JVD.  Cardiovascular:     Rate and Rhythm: Normal rate and regular rhythm.     Heart sounds: S1 normal and S2 normal.     Comments: Stage I lymphedema.  No pitting edema. Pulmonary:     Effort: Pulmonary effort is normal. No tachypnea or accessory muscle usage.     Breath sounds: Normal breath sounds. No decreased breath sounds, wheezing, rhonchi  or rales.  Abdominal:     General: Bowel sounds are normal. There is no distension.     Palpations: Abdomen is soft.     Tenderness: There is no abdominal tenderness. There is no right CVA tenderness, left CVA tenderness or guarding.  Musculoskeletal:     Cervical back: Neck supple.     Right lower leg: No edema.     Left lower leg: No edema.  Skin:    General: Skin is warm and dry.  Neurological:     General: No focal deficit present.     Mental Status: She is alert and oriented to person, place, and time.  Psychiatric:        Mood and Affect: Mood is anxious.        Behavior: Behavior normal. Behavior is cooperative.     Data Reviewed:  Results are pending, will review when available. 11/01/2023 echocardiogram report. IMPRESSIONS:   1. Left ventricular ejection fraction, by estimation, is 40 to 45%. Left  ventricular ejection fraction by 3D volume is 40 %. The left ventricle has  mildly decreased function. The left ventricle demonstrates global  hypokinesis. The left ventricular  internal cavity size was mildly to moderately dilated. There is moderate  left ventricular hypertrophy. Left ventricular diastolic parameters are   consistent with Grade I diastolic dysfunction (impaired relaxation). The  average left ventricular global  longitudinal strain is -3.2 %. The global longitudinal strain is abnormal.   2. Right ventricular systolic function is normal. The right ventricular  size is normal. There is normal pulmonary artery systolic pressure.   3. Left atrial size was mildly dilated.   4. A small pericardial effusion is present.   5. The mitral valve is normal in structure. No evidence of mitral valve  regurgitation. No evidence of mitral stenosis.   6. The aortic valve is normal in structure. Aortic valve regurgitation is  not visualized. No aortic stenosis is present.   7. The inferior vena cava is normal in size with greater than 50%  respiratory variability, suggesting right atrial pressure of 3 mmHg.   EKG: Vent. rate 83 BPM PR interval 171 ms QRS duration 112 ms QT/QTcB 424/499 ms P-R-T axes 49 39 105 Sinus rhythm Probable left atrial enlargement Borderline intraventricular conduction delay Abnormal T, consider ischemia, lateral leads Borderline ST elevation, anterior leads Prolonged QT interval  Assessment and Plan: Principal Problem:   Uncontrolled diabetes mellitus with hyperglycemia (HCC) Observation/telemetry. No further fluids. Carbohydrate modified diet. Resume CBG monitoring with RI SS. Resume long-acting insulin . Hemoglobin A1c was 11.3% 2+ months ago.  Active Problems:   COVID-19 virus infection Supportive care. She is past the window for antiviral therapy. Will give Hycodan every 6 hours as needed for cough.    Anxiety Will treat her COVID symptoms. No anxiolytics since getting Hycodan for cough/body aches.    Hypertension Continue amlodipine  10 mg p.o. daily.   Continue carvedilol  25 mg p.o. daily. Continue HCTZ 25 mg p.o. daily. Continue spironolactone  50 mg p.o. daily. Continue torsemide  20 mg p.o. daily. Continue valsartan  320 mg p.o. daily.    Heart failure  with mildly reduced ejection fraction (HCC) Does not look volume overloaded. Continue beta-blocker, diuretics and ARB as above.    Mixed hyperlipidemia Continue rosuvastatin  20 mg p.o. daily.    History of CVA (cerebrovascular accident) Supportive care. Lifestyle modifications. Needs to reestablish with PCP or establish with new provider.    Tobacco abuse In remission according to the patient.  Class 3 obesity Current BMI  kg/m. Would benefit from lifestyle modifications. Follow-up closely with PCP and/or bariatric clinic.    Prolonged QT interval Avoid QT prolonging meds as possible. KCl supplementation. Magnesium  sulfate 2 g IVPB now. Keep electrolytes optimized. Check EKG in the morning.     Advance Care Planning:   Code Status: Full Code   Consults:   Family Communication:   Severity of Illness: The appropriate patient status for this patient is OBSERVATION. Observation status is judged to be reasonable and necessary in order to provide the required intensity of service to ensure the patient's safety. The patient's presenting symptoms, physical exam findings, and initial radiographic and laboratory data in the context of their medical condition is felt to place them at decreased risk for further clinical deterioration. Furthermore, it is anticipated that the patient will be medically stable for discharge from the hospital within 2 midnights of admission.   Author: Danice Dural, MD 01/06/2024 8:55 AM  For on call review [email or web address].   This document was prepared using Dragon voice recognition software and may contain some unintended transcription errors.

## 2024-01-07 ENCOUNTER — Other Ambulatory Visit: Payer: Self-pay

## 2024-01-07 ENCOUNTER — Ambulatory Visit: Admitting: Vascular Surgery

## 2024-01-07 DIAGNOSIS — E1165 Type 2 diabetes mellitus with hyperglycemia: Secondary | ICD-10-CM | POA: Diagnosis not present

## 2024-01-07 LAB — COMPREHENSIVE METABOLIC PANEL WITH GFR
ALT: 22 U/L (ref 0–44)
AST: 19 U/L (ref 15–41)
Albumin: 2.5 g/dL — ABNORMAL LOW (ref 3.5–5.0)
Alkaline Phosphatase: 74 U/L (ref 38–126)
Anion gap: 7 (ref 5–15)
BUN: 17 mg/dL (ref 6–20)
CO2: 26 mmol/L (ref 22–32)
Calcium: 8.5 mg/dL — ABNORMAL LOW (ref 8.9–10.3)
Chloride: 98 mmol/L (ref 98–111)
Creatinine, Ser: 0.94 mg/dL (ref 0.44–1.00)
GFR, Estimated: >60 mL/min (ref >60)
Glucose, Bld: 289 mg/dL — ABNORMAL HIGH (ref 70–99)
Potassium: 3.8 mmol/L (ref 3.5–5.1)
Sodium: 131 mmol/L — ABNORMAL LOW (ref 135–145)
Total Bilirubin: 0.3 mg/dL (ref 0.0–1.2)
Total Protein: 6.5 g/dL (ref 6.5–8.1)

## 2024-01-07 LAB — CBC
HCT: 34.1 % — ABNORMAL LOW (ref 36.0–46.0)
Hemoglobin: 10.9 g/dL — ABNORMAL LOW (ref 12.0–15.0)
MCH: 26.7 pg (ref 26.0–34.0)
MCHC: 32 g/dL (ref 30.0–36.0)
MCV: 83.6 fL (ref 80.0–100.0)
Platelets: 237 10*3/uL (ref 150–400)
RBC: 4.08 MIL/uL (ref 3.87–5.11)
RDW: 13.9 % (ref 11.5–15.5)
WBC: 6 10*3/uL (ref 4.0–10.5)
nRBC: 0 % (ref 0.0–0.2)

## 2024-01-07 LAB — GLUCOSE, CAPILLARY
Glucose-Capillary: 285 mg/dL — ABNORMAL HIGH (ref 70–99)
Glucose-Capillary: 367 mg/dL — ABNORMAL HIGH (ref 70–99)
Glucose-Capillary: 208 mg/dL — ABNORMAL HIGH (ref 70–99)
Glucose-Capillary: 210 mg/dL — ABNORMAL HIGH (ref 70–99)

## 2024-01-07 MED ORDER — INSULIN GLARGINE-YFGN 100 UNIT/ML ~~LOC~~ SOLN
35.0000 [IU] | Freq: Every day | SUBCUTANEOUS | Status: DC
  Administered 2024-01-07: 35 [IU] via SUBCUTANEOUS
  Filled 2024-01-07: qty 0.35

## 2024-01-07 MED ORDER — ASPIRIN 81 MG PO CHEW
81.0000 mg | CHEWABLE_TABLET | Freq: Every day | ORAL | Status: DC
  Administered 2024-01-07: 81 mg via ORAL
  Filled 2024-01-07: qty 1

## 2024-01-07 MED ORDER — INSULIN GLARGINE SOLOSTAR 100 UNIT/ML ~~LOC~~ SOPN
28.0000 [IU] | PEN_INJECTOR | Freq: Every day | SUBCUTANEOUS | 1 refills | Status: DC
  Filled 2024-01-07 (×2): qty 15, 53d supply, fill #0

## 2024-01-07 MED ORDER — INSULIN LISPRO (1 UNIT DIAL) 100 UNIT/ML (KWIKPEN): 22.0000 [IU] | PEN_INJECTOR | Freq: Three times a day (TID) | SUBCUTANEOUS | 1 refills | Status: DC

## 2024-01-07 MED ORDER — ENOXAPARIN SODIUM 40 MG/0.4ML IJ SOSY
40.0000 mg | PREFILLED_SYRINGE | INTRAMUSCULAR | Status: DC
  Administered 2024-01-07: 40 mg via SUBCUTANEOUS
  Filled 2024-01-07: qty 0.4

## 2024-01-07 MED ORDER — INSULIN ASPART 100 UNIT/ML IJ SOLN
15.0000 [IU] | Freq: Three times a day (TID) | INTRAMUSCULAR | Status: DC
  Administered 2024-01-07 – 2024-01-08 (×2): 15 [IU] via SUBCUTANEOUS

## 2024-01-07 MED ORDER — INSULIN ASPART 100 UNIT/ML IJ SOLN
0.0000 [IU] | Freq: Three times a day (TID) | INTRAMUSCULAR | Status: DC
  Administered 2024-01-07: 5 [IU] via SUBCUTANEOUS
  Administered 2024-01-08 (×2): 8 [IU] via SUBCUTANEOUS

## 2024-01-07 NOTE — Inpatient Diabetes Management (Signed)
 Inpatient Diabetes Program Recommendations  AACE/ADA: New Consensus Statement on Inpatient Glycemic Control (2015)  Target Ranges:  Prepandial:   less than 140 mg/dL      Peak postprandial:   less than 180 mg/dL (1-2 hours)      Critically ill patients:  140 - 180 mg/dL   Lab Results  Component Value Date   GLUCAP 285 (H) 01/07/2024   HGBA1C 11.3 (H) 10/29/2023    Review of Glycemic Control  Latest Reference Range & Units 01/05/24 23:14 01/06/24 00:40 01/06/24 01:58 01/06/24 03:37 01/06/24 08:06 01/06/24 12:09 01/06/24 17:44 01/06/24 21:20 01/07/24 08:04  Glucose-Capillary 70 - 99 mg/dL 161 (HH) 096 (H) 045 (H) 259 (H) 173 (H) 248 (H) 281 (H) 330 (H) 285 (H)  (HH): Data is critically high (H): Data is abnormally high  Diabetes history: DM2 Outpatient Diabetes medications: Lantus  30 units every day, Humalog  22 units TID Current orders for Inpatient glycemic control: Semglee  30 units every day, Novolog  0-20 units TID, Metformin  1000 mg BID  Inpatient Diabetes Program Recommendations:    Patient is familiar to the Inpatient Diabetes team.  DM coordinators spoke with her in January and February regarding DM management.    Please consider:  Novolog  6 units TID with meals if she consumes at least 50% Semglee  34 units every day  Will continue to follow while inpatient.  Thank you, Hays Lipschutz, MSN, CDCES Diabetes Coordinator Inpatient Diabetes Program [phone] (team pager from 8a-5p)

## 2024-01-07 NOTE — Addendum Note (Signed)
 Addended byChristean Courts C on: 01/07/2024 01:13 PM   Modules accepted: Orders

## 2024-01-07 NOTE — Telephone Encounter (Signed)
 Lantus  and Humalog  insulin  prescription send to pharmacy.

## 2024-01-07 NOTE — Progress Notes (Signed)
 PROGRESS NOTE Carolyn Zimmerman  BJS:[id number] DOB: 30-Apr-1986 DOA: 01/05/2024 PCP: Estil Heman, NP  Brief Narrative/Hospital Course: 37yof w/ anxiety/depression left MCA watershed CVA, hyperlipidemia, hypertension, chronic combined systolic heart failure with EF of 40-45% feb 2025,class III obesity, tobacco use,  IDDM  HX OF tubo-ovarian abscess/hidradenitis suppurativa/ hx of necrotizing fasciitis due to panniculitis with abdominal wall abscess requiring panniculectomy /chest wall abscesses/recurrent cellulitis of lower extremities, stage I lymphedema presented the ED with complaints of almost 2 weeks of URI symptoms-cough occasionally sputum, fatigue malaise shortness of breath.  Initially she attributed to allergy symptoms.  Recently increased her torsemide  without help.  Also In the ED normal BMP blood sugar in 529 beta-hCG less than 1 COVID positive.  Labs otherwise fairly unremarkable Chest x-ray from 4/19 no active disease. It appears she has not taken her insulin  for the past 2 weeks because of not availability.   Patient was further admitted due to hyperglycemia and COVID infection.  Subjective: Seen and examined Resting comfortably on room air but appears weak frail, not ready for discharge Overnight afebrile BP 140s-160s, on room air. Labs with sodium 131 glucose 229 normal LFTs, HB ~ 10 chronic   Assessment and Plan:  Diabetes mellitus with uncontrolled hyperglycemiaL Cbg in 500s, 2/2 not having insulin  for 2 weeks Increase Semglee  35 u , add 15 u premeal ) she takes 22 u premeal, cont metformin , SSI, CBG in 200s. A1c 11.3% 2+ months ago. Recent Labs  Lab 01/06/24 1209 01/06/24 1744 01/06/24 2120 01/07/24 0804 01/07/24 1146  GLUCAP 248* 281* 330* 285* 367*    COVID-19 virus infection: No hypoxia x-ray without acute finding. She is past the window for antiviral therapy. Cont supportive care, - Hycodan every 6 hours as needed for cough.  Anxiety Continue supportive  care  Chronic combined systolic and diastolic CHF: BNP normal but she is obese, chest x-ray no acute finding.  Chronic lymphedema present. Continue GDMT with-Coreg  Aldactone  valsartan  and her torsemide  20  Hypertension: BP stable continue GDMT as above and amlodipine , HCTZ.  Mixed hyperlipidemia Continue rosuvastatin  20 mg p.o. daily.   History of CVA Lifestyle modifications.Needs to reestablish with PCP or establish with new provider.   Tobacco abuse In remission according to the patient.  Morbid obesity with BMI 45  Would benefit from lifestyle modifications.Follow-up closely with PCP and/or bariatric clinic.   Prolonged QT interval Avoid QT prolonging meds as possible. Monitor electrolytes and replace.    Weakness deconditioning/debility: In the setting of COVID infection.  Obtain PT OT eval     DVT prophylaxis: enoxaparin  (LOVENOX ) injection 40 mg Start: 01/07/24 1445 SCDs Start: 01/06/24 0825 Code Status:   Code Status: Full Code Family Communication: plan of care discussed with patient at bedside. Patient status is: Remains hospitalized because of severity of illness Level of care: Telemetry   Dispo: The patient is from: home            Anticipated disposition: TBD  Objective: Vitals last 24 hrs: Vitals:   01/06/24 1357 01/06/24 2025 01/07/24 0435 01/07/24 0914  BP: (!) 162/88 (!) 149/88 (!) 154/90 124/74  Pulse: 74 83 72 75  Resp:  18 16   Temp:  98.4 F (36.9 C) 97.6 F (36.4 C)   TempSrc:  Oral    SpO2: 100% 100% 100%   Weight:      Height:       Weight change:   Physical Examination: General exam: alert awake, older than stated age, obese, ill-appearing HEENT:Oral mucosa  moist, Ear/Nose WNL grossly Respiratory system: Bilaterally diminished BS, no wheezing no use of accessory muscle Cardiovascular system: S1 & S2 +. Gastrointestinal system: Abdomen soft, NT,ND,BS+ Nervous System: Alert, awake,following commands. Extremities: LE edema neg,  moving arms, warm legs Skin: No rashes,warm. MSK: Normal muscle bulk/tone.   Medications reviewed:  Scheduled Meds:  amLODipine   10 mg Oral Daily   carvedilol   25 mg Oral BID   enoxaparin  (LOVENOX ) injection  40 mg Subcutaneous Q24H   hydrochlorothiazide   25 mg Oral Daily   insulin  aspart  0-15 Units Subcutaneous TID WC   insulin  aspart  15 Units Subcutaneous TID WC   insulin  glargine-yfgn  35 Units Subcutaneous QHS   irbesartan   300 mg Oral Daily   metFORMIN   1,000 mg Oral Q breakfast   rosuvastatin   20 mg Oral Daily   spironolactone   50 mg Oral Daily   torsemide   40 mg Oral Daily   Continuous Infusions:    Diet Order             Diet heart healthy/carb modified Room service appropriate? Yes; Fluid consistency: Thin  Diet effective now                 No intake or output data in the 24 hours ending 01/07/24 1345 Net IO Since Admission: 500 mL [01/07/24 1345]  Wt Readings from Last 3 Encounters:  01/06/24 133 kg  01/03/24 130.1 kg  12/05/23 133.4 kg    Unresulted Labs (From admission, onward)     Start     Ordered   01/14/24 0500  Creatinine, serum  (enoxaparin  (LOVENOX )    CrCl >/= 30 ml/min)  Weekly,   R     Comments: while on enoxaparin  therapy    01/07/24 1345          Data Reviewed: I have personally reviewed following labs and imaging studies ( see epic result tab) CBC: Recent Labs  Lab 01/05/24 2026 01/07/24 0533  WBC 8.4 6.0  NEUTROABS 4.5  --   HGB 10.8* 10.9*  HCT 32.4* 34.1*  MCV 81.0 83.6  PLT 286 237   CMP: Recent Labs  Lab 01/05/24 2026 01/06/24 0341 01/06/24 0906 01/07/24 0533  NA 126* 134*  --  131*  K 4.0 3.7  --  3.8  CL 91* 100  --  98  CO2 26 27  --  26  GLUCOSE 529* 260*  --  289*  BUN 17 14  --  17  CREATININE 1.19* 1.01*  --  0.94  CALCIUM  9.4 9.2  --  8.5*  MG  --   --  1.9  --    GFR: Estimated Creatinine Clearance: 118.9 mL/min (by C-G formula based on SCr of 0.94 mg/dL). Recent Labs  Lab 01/07/24 0533  AST  19  ALT 22  ALKPHOS 74  BILITOT 0.3  PROT 6.5  ALBUMIN 2.5*   No results for input(s): "CHOL", "HDL", "LDLCALC", "TRIG", "CHOLHDL", "LDLDIRECT" in the last 72 hours. No results for input(s): "TSH", "T4TOTAL", "FREET4", "T3FREE", "THYROIDAB" in the last 72 hours. Sepsis Labs:No results for input(s): "PROCALCITON", "LATICACIDVEN" in the last 168 hours. Recent Results (from the past 240 hours)  Resp panel by RT-PCR (RSV, Flu A&B, Covid) Anterior Nasal Swab     Status: Abnormal   Collection Time: 01/05/24  8:27 PM   Specimen: Anterior Nasal Swab  Result Value Ref Range Status   SARS Coronavirus 2 by RT PCR POSITIVE (A) NEGATIVE Final    Comment: (  NOTE) SARS-CoV-2 target nucleic acids are DETECTED.  The SARS-CoV-2 RNA is generally detectable in upper respiratory specimens during the acute phase of infection. Positive results are indicative of the presence of the identified virus, but do not rule out bacterial infection or co-infection with other pathogens not detected by the test. Clinical correlation with patient history and other diagnostic information is necessary to determine patient infection status. The expected result is Negative.  Fact Sheet for Patients: BloggerCourse.com  Fact Sheet for Healthcare Providers: SeriousBroker.it  This test is not yet approved or cleared by the [place]  FDA and  has been authorized for detection and/or diagnosis of SARS-CoV-2 by FDA under an Emergency Use Authorization (EUA).  This EUA will remain in effect (meaning this test can be used) for the duration of  the COVID-19 declaration under Section 564(b)(1) of the A ct, 21 U.S.C. section 360bbb-3(b)(1), unless the authorization is terminated or revoked sooner.     Influenza A by PCR NEGATIVE NEGATIVE Final   Influenza B by PCR NEGATIVE NEGATIVE Final    Comment: (NOTE) The Xpert Xpress SARS-CoV-2/FLU/RSV plus assay is intended as an  aid in the diagnosis of influenza from Nasopharyngeal swab specimens and should not be used as a sole basis for treatment. Nasal washings and aspirates are unacceptable for Xpert Xpress SARS-CoV-2/FLU/RSV testing.  Fact Sheet for Patients: BloggerCourse.com  Fact Sheet for Healthcare Providers: SeriousBroker.it  This test is not yet approved or cleared by the [place]  FDA and has been authorized for detection and/or diagnosis of SARS-CoV-2 by FDA under an Emergency Use Authorization (EUA). This EUA will remain in effect (meaning this test can be used) for the duration of the COVID-19 declaration under Section 564(b)(1) of the Act, 21 U.S.C. section 360bbb-3(b)(1), unless the authorization is terminated or revoked.     Resp Syncytial Virus by PCR NEGATIVE NEGATIVE Final    Comment: (NOTE) Fact Sheet for Patients: BloggerCourse.com  Fact Sheet for Healthcare Providers: SeriousBroker.it  This test is not yet approved or cleared by the [place]  FDA and has been authorized for detection and/or diagnosis of SARS-CoV-2 by FDA under an Emergency Use Authorization (EUA). This EUA will remain in effect (meaning this test can be used) for the duration of the COVID-19 declaration under Section 564(b)(1) of the Act, 21 U.S.C. section 360bbb-3(b)(1), unless the authorization is terminated or revoked.  Performed at [facility], [address] [place], [place] [address]     Antimicrobials/Microbiology: Anti-infectives (From admission, onward)    None         Component Value Date/Time   SDES  10/29/2023 2330    BLOOD RIGHT ANTECUBITAL Performed at [facility], [address] [place], [place] [address]    [facility]  10/29/2023 2330    BOTTLES DRAWN AEROBIC AND ANAEROBIC Blood Culture adequate volume Performed at [facility], [address] [place], [place] [address]    CULT  10/29/2023 2330    NO GROWTH 5 DAYS Performed at [facility], [address], [place], [place] [address]    REPTSTATUS 11/04/2023 FINAL 10/29/2023 2330    Radiology Studies: DG Chest 2 View Result Date: 01/05/2024 CLINICAL DATA:  Shortness of breath EXAM: CHEST - 2 VIEW COMPARISON:  10/30/2023 FINDINGS: Stable cardiomediastinal silhouette. No focal consolidation, pleural effusion, or pneumothorax. No displaced rib fractures. IMPRESSION: No active cardiopulmonary disease. Electronically Signed   By: Rozell Cornet M.D.   On: 01/05/2024 21:27    LOS: 0 days  Total time spent in review of labs and imaging, patient evaluation, formulation of plan, documentation and communication with patient/family: 35 minutes  Lesa Rape, MD [facility] 01/07/2024, 1:45 PM

## 2024-01-07 NOTE — Telephone Encounter (Signed)
 Scripts pended and sent to [employer] for approval.

## 2024-01-07 NOTE — Progress Notes (Signed)
   01/07/24 1325  TOC Brief Assessment  Insurance and Status Reviewed  Patient has primary care physician Yes  Home environment has been reviewed Lives in apartment  Prior level of function: Independent  Prior/Current Home Services No current home services  Social Drivers of Health Review SDOH reviewed interventions complete  Readmission risk has been reviewed Yes  Transition of care needs no transition of care needs at this time    SDOH Interventions Today    Flowsheet Row Most Recent Value  SDOH Interventions   Food Insecurity Interventions Inpatient TOC, Community Resources Provided  Housing Interventions Inpatient TOC, Community Resources Provided, [id number] Referral  Utilities Interventions Inpatient TOC, [employer] Resources Provided

## 2024-01-07 NOTE — Discharge Instructions (Signed)
 [employer] (TEAM) Project, a partnership between Armed forces operational officer [employer] [place]  and [employer] for Housing and Community Studies. The TEAM program provides legal representation, mediation services, and help with rental assistance applications right outside Small Claims eviction court in both [place] and [address] The program offers services to both tenants and landlords to help residents avoid an eviction judgment and remain in their homes. [facility] Department of Social Services, [employer] Program (heating/cooling and water assistance) - [phone] [employer] (water, power and gas utility assistance) - 571-615-9370 [employer] (rental and utility assistance) ([phone] Madelene Schanz - call 211 [employer] (Refugee-specific rental assistance) - [phone] Welfare Reform Liaison Project (WRLP) (housing expenses and utilities) 423-475-8368 [place]  [employer], [place] [employer]owner Assistance Fund (homeowners needing help preventing mortgage delinquencies, defaults, displacements, and foreclosures due to COVID-19 negative impacts) - 8137970377  FOOD PANTRY [employer] [address] [phone]  [facility] Table Food [employer] [address] [place] ([phone]  [facility] - [employer] [address] [place] [phone]  [facility] [address] [phone]  [facility] - Food Distribution Center [address] [place], [place] [address] [phone]

## 2024-01-07 NOTE — Plan of Care (Signed)
   Problem: Education: Goal: Knowledge of risk factors and measures for prevention of condition will improve Outcome: Progressing   Problem: Respiratory: Goal: Will maintain a patent airway Outcome: Progressing   Problem: Respiratory: Goal: Complications related to the disease process, condition or treatment will be avoided or minimized Outcome: Progressing   Problem: Pain Managment: Goal: General experience of comfort will improve and/or be controlled Outcome: Progressing   Problem: Safety: Goal: Ability to remain free from injury will improve Outcome: Progressing

## 2024-01-07 NOTE — Addendum Note (Signed)
 Addended by: Rexie Catena A on: 01/07/2024 11:31 AM   Modules accepted: Orders

## 2024-01-07 NOTE — Hospital Course (Addendum)
 37yof w/ anxiety/depression left MCA watershed CVA, hyperlipidemia, hypertension, chronic combined systolic heart failure with EF of 40-45% feb 2025,class III obesity, tobacco use,  IDDM  HX OF tubo-ovarian abscess/hidradenitis suppurativa/ hx of necrotizing fasciitis due to panniculitis with abdominal wall abscess requiring panniculectomy /chest wall abscesses/recurrent cellulitis of lower extremities, stage I lymphedema presented the ED with complaints of almost 2 weeks of URI symptoms-cough occasionally sputum, fatigue malaise shortness of breath.  Initially she attributed to allergy symptoms.  Recently increased her torsemide  without help.  Also In the ED normal BMP blood sugar in 529 beta-hCG less than 1 COVID positive.  Labs otherwise fairly unremarkable Chest x-ray from 4/19 no active disease. It appears she has not taken her insulin  for the past 2 weeks because of not availability.   Patient was further admitted due to hyperglycemia and COVID infection. She has generalized weakness otherwise not hypoxic no pneumonia and out of treatment window for COVID infection. Blood sugar overall improving. She is medically stable and being discharged home with adjustment of her insulin  regimen  Subjective: Seen this am No new complaints. Overnight afebrile BP stable blood sugar 208-286 On room air.    Discharge diagnosis: Diabetes mellitus with uncontrolled hyperglycemiaL Cbg in 500s, 2/2 not having insulin  for 2 weeks Increase Semglee   38 u and cont home 22 u premeal and metformin .Cont  SSI, CBG in 200s.. A1c 11.3%, 2+ months ago. Recent Labs  Lab 01/07/24 1146 01/07/24 1651 01/07/24 2111 01/08/24 0732 01/08/24 1143  GLUCAP 367* 208* 210* 286* 254*    COVID-19 virus infection: No hypoxia x-ray without acute finding. She is past the window for antiviral therapy. Cont supportive care, - Hycodan every 6 hours as needed for cough.  Anxiety Continue supportive care  Chronic combined  systolic and diastolic CHF: BNP normal but she is obese, chest x-ray no acute finding.  Chronic lymphedema present. Continue GDMT with-Coreg  Aldactone  valsartan  and her torsemide  20  Hypertension: BP stable continue GDMT as above and amlodipine , HCTZ.  Mixed hyperlipidemia Continue rosuvastatin  20 mg p.o. daily.   History of CVA Lifestyle modifications.Needs to reestablish with PCP or establish with new provider.   Tobacco abuse In remission according to the patient.  Morbid obesity with BMI 45  Would benefit from lifestyle modifications.Follow-up closely with PCP and/or bariatric clinic.   Prolonged QT interval Avoid QT prolonging meds as possible.  Weakness deconditioning/debility: In the setting of COVID infection.  Ambulatory

## 2024-01-08 ENCOUNTER — Other Ambulatory Visit: Payer: Self-pay

## 2024-01-08 DIAGNOSIS — N179 Acute kidney failure, unspecified: Secondary | ICD-10-CM | POA: Insufficient documentation

## 2024-01-08 DIAGNOSIS — E1165 Type 2 diabetes mellitus with hyperglycemia: Secondary | ICD-10-CM | POA: Diagnosis not present

## 2024-01-08 LAB — GLUCOSE, CAPILLARY
Glucose-Capillary: 286 mg/dL — ABNORMAL HIGH (ref 70–99)
Glucose-Capillary: 254 mg/dL — ABNORMAL HIGH (ref 70–99)

## 2024-01-08 MED ORDER — INSULIN GLARGINE-YFGN 100 UNIT/ML ~~LOC~~ SOLN
40.0000 [IU] | Freq: Every day | SUBCUTANEOUS | Status: DC
  Filled 2024-01-08: qty 0.4

## 2024-01-08 MED ORDER — INSULIN GLARGINE SOLOSTAR 100 UNIT/ML ~~LOC~~ SOPN: 38.0000 [IU] | PEN_INJECTOR | Freq: Every day | SUBCUTANEOUS | Status: DC

## 2024-01-08 MED ORDER — INSULIN ASPART 100 UNIT/ML IJ SOLN
18.0000 [IU] | Freq: Three times a day (TID) | INTRAMUSCULAR | Status: DC
  Administered 2024-01-08: 18 [IU] via SUBCUTANEOUS

## 2024-01-08 NOTE — Plan of Care (Signed)
  Problem: Education: Goal: Knowledge of risk factors and measures for prevention of condition will improve Outcome: Adequate for Discharge   Problem: Coping: Goal: Psychosocial and spiritual needs will be supported Outcome: Adequate for Discharge   Problem: Respiratory: Goal: Will maintain a patent airway Outcome: Adequate for Discharge Goal: Complications related to the disease process, condition or treatment will be avoided or minimized Outcome: Adequate for Discharge   Problem: Education: Goal: Ability to describe self-care measures that may prevent or decrease complications (Diabetes Survival Skills Education) will improve Outcome: Adequate for Discharge Goal: Individualized Educational Video(s) Outcome: Adequate for Discharge   Problem: Coping: Goal: Ability to adjust to condition or change in health will improve Outcome: Adequate for Discharge   Problem: Fluid Volume: Goal: Ability to maintain a balanced intake and output will improve Outcome: Adequate for Discharge   Problem: Health Behavior/Discharge Planning: Goal: Ability to identify and utilize available resources and services will improve Outcome: Adequate for Discharge Goal: Ability to manage health-related needs will improve Outcome: Adequate for Discharge   Problem: Metabolic: Goal: Ability to maintain appropriate glucose levels will improve Outcome: Adequate for Discharge   Problem: Nutritional: Goal: Maintenance of adequate nutrition will improve Outcome: Adequate for Discharge Goal: Progress toward achieving an optimal weight will improve Outcome: Adequate for Discharge   Problem: Skin Integrity: Goal: Risk for impaired skin integrity will decrease Outcome: Adequate for Discharge   Problem: Tissue Perfusion: Goal: Adequacy of tissue perfusion will improve Outcome: Adequate for Discharge   Problem: Education: Goal: Knowledge of General Education information will improve Description: Including  pain rating scale, medication(s)/side effects and non-pharmacologic comfort measures Outcome: Adequate for Discharge   Problem: Health Behavior/Discharge Planning: Goal: Ability to manage health-related needs will improve Outcome: Adequate for Discharge   Problem: Clinical Measurements: Goal: Ability to maintain clinical measurements within normal limits will improve Outcome: Adequate for Discharge Goal: Will remain free from infection Outcome: Adequate for Discharge Goal: Diagnostic test results will improve Outcome: Adequate for Discharge Goal: Respiratory complications will improve Outcome: Adequate for Discharge Goal: Cardiovascular complication will be avoided Outcome: Adequate for Discharge   Problem: Activity: Goal: Risk for activity intolerance will decrease Outcome: Adequate for Discharge   Problem: Nutrition: Goal: Adequate nutrition will be maintained Outcome: Adequate for Discharge   Problem: Coping: Goal: Level of anxiety will decrease Outcome: Adequate for Discharge   Problem: Elimination: Goal: Will not experience complications related to bowel motility Outcome: Adequate for Discharge Goal: Will not experience complications related to urinary retention Outcome: Adequate for Discharge   Problem: Pain Managment: Goal: General experience of comfort will improve and/or be controlled Outcome: Adequate for Discharge   Problem: Safety: Goal: Ability to remain free from injury will improve Outcome: Adequate for Discharge   Problem: Skin Integrity: Goal: Risk for impaired skin integrity will decrease   Genella Kendall, RN

## 2024-01-08 NOTE — Discharge Summary (Signed)
 Physician Discharge Summary  Carolyn Zimmerman EAV:[id number] DOB: Apr 08, 1986 DOA: 01/05/2024  PCP: Estil Heman, NP  Admit date: 01/05/2024 Discharge date: 01/08/2024 Recommendations for Outpatient Follow-up:  Follow up with PCP in 1 weeks-call for appointment Please obtain BMP/CBC in one week  Discharge Dispo: hOME admission negative Discharge Condition: Stable Code Status:   Code Status: Full Code Diet recommendation:  Diet Order             Diet heart healthy/carb modified Room service appropriate? Yes; Fluid consistency: Thin  Diet effective now                    Brief/Interim Summary: 38yof w/ anxiety/depression left MCA watershed CVA, hyperlipidemia, hypertension, chronic combined systolic heart failure with EF of 40-45% feb 2025,class III obesity, tobacco use,  IDDM  HX OF tubo-ovarian abscess/hidradenitis suppurativa/ hx of necrotizing fasciitis due to panniculitis with abdominal wall abscess requiring panniculectomy /chest wall abscesses/recurrent cellulitis of lower extremities, stage I lymphedema presented the ED with complaints of almost 2 weeks of URI symptoms-cough occasionally sputum, fatigue malaise shortness of breath.  Initially she attributed to allergy symptoms.  Recently increased her torsemide  without help.  Also In the ED normal BMP blood sugar in 529 beta-hCG less than 1 COVID positive.  Labs otherwise fairly unremarkable Chest x-ray from 4/19 no active disease. It appears she has not taken her insulin  for the past 2 weeks because of not availability.   Patient was further admitted due to hyperglycemia and COVID infection. She has generalized weakness otherwise not hypoxic no pneumonia and out of treatment window for COVID infection. Blood sugar overall improving. She is medically stable and being discharged home with adjustment of her insulin  regimen  Subjective: Seen this am No new complaints. Overnight afebrile BP stable blood sugar 208-286 On room air.     Discharge diagnosis: Diabetes mellitus with uncontrolled hyperglycemiaL Cbg in 500s, 2/2 not having insulin  for 2 weeks Increase Semglee   36 u and cont home 22 u premeal and metformin .Cont  SSI, CBG in 200s.. A1c 11.3%, 2+ months ago. Recent Labs  Lab 01/07/24 1146 01/07/24 1651 01/07/24 2111 01/08/24 0732 01/08/24 1143  GLUCAP 367* 208* 210* 286* 254*    COVID-19 virus infection: No hypoxia x-ray without acute finding. She is past the window for antiviral therapy. Cont supportive care, - Hycodan every 6 hours as needed for cough.  Anxiety Continue supportive care  Chronic combined systolic and diastolic CHF: BNP normal but she is obese, chest x-ray no acute finding.  Chronic lymphedema present. Continue GDMT with-Coreg  Aldactone  valsartan  and her torsemide  20  Hypertension: BP stable continue GDMT as above and amlodipine , HCTZ.  Mixed hyperlipidemia Continue rosuvastatin  20 mg p.o. daily.   History of CVA Lifestyle modifications.Needs to reestablish with PCP or establish with new provider.   Tobacco abuse In remission according to the patient.  Morbid obesity with BMI 45  Would benefit from lifestyle modifications.Follow-up closely with PCP and/or bariatric clinic.   Prolonged QT interval Avoid QT prolonging meds as possible.  Weakness deconditioning/debility: In the setting of COVID infection.  Ambulatory     Discharge Exam: Vitals:   01/07/24 1927 01/08/24 0444  BP: 137/82 136/78  Pulse: 77 78  Resp:  17  Temp: 99.1 F (37.3 C) 98.7 F (37.1 C)  SpO2: 100% 96%   General: Pt is alert, awake, not in acute distress Cardiovascular: RRR, S1/S2 +, no rubs, no gallops Respiratory: CTA bilaterally, no wheezing, no rhonchi Abdominal: Soft, NT,  ND, bowel sounds + Extremities: no edema, no cyanosis  Discharge Instructions  Discharge Instructions     Discharge instructions   Complete by: As directed    Monitor blood sugar ACHS follow-up with  PCP  Please call call MD or return to ER for similar or worsening recurring problem that brought you to hospital or if any fever,nausea/vomiting,abdominal pain, uncontrolled pain, chest pain,  shortness of breath or any other alarming symptoms.  Please follow-up your doctor as instructed in a week time and call the office for appointment.  Please avoid alcohol, smoking, or any other illicit substance and maintain healthy habits including taking your regular medications as prescribed.  You were cared for by a hospitalist during your hospital stay. If you have any questions about your discharge medications or the care you received while you were in the hospital after you are discharged, you can call the unit and ask to speak with the hospitalist on call if the hospitalist that took care of you is not available.  Once you are discharged, your primary care physician will handle any further medical issues. Please note that NO REFILLS for any discharge medications will be authorized once you are discharged, as it is imperative that you return to your primary care physician (or establish a relationship with a primary care physician if you do not have one) for your aftercare needs so that they can reassess your need for medications and monitor your lab values   Increase activity slowly   Complete by: As directed       Allergies as of 01/08/2024       Reactions   Contrast Media [iodinated Contrast Media] Hives, Swelling        Medication List     TAKE these medications    Aleve  220 MG tablet Generic drug: naproxen  sodium Take 1,100 mg by mouth daily as needed (for headaches or mild pain).   amLODipine  10 MG tablet Commonly known as: NORVASC  Take 1 tablet (10 mg total) by mouth daily. What changed: when to take this   Aspirin  Low Dose 81 MG chewable tablet Generic drug: aspirin  Chew 1 tablet (81 mg total) by mouth daily. What changed: when to take this   carvedilol  25 MG  tablet Commonly known as: COREG  Take 1 tablet (25 mg total) by mouth 2 (two) times daily.   hydrochlorothiazide  25 MG tablet Commonly known as: HYDRODIURIL  Take 1 tablet (25 mg total) by mouth daily.   Insulin  Glargine Solostar 100 UNIT/ML Solostar Pen Commonly known as: LANTUS  Inject 38 Units into the skin daily. What changed: how much to take   insulin  lispro 100 UNIT/ML KwikPen Commonly known as: HUMALOG  Inject 22 Units into the skin 3 (three) times daily.   metFORMIN  500 MG 24 hr tablet Commonly known as: GLUCOPHAGE -XR Take 2 tablets (1,000 mg total) by mouth in the morning and at bedtime.   rosuvastatin  20 MG tablet Commonly known as: CRESTOR  Take 1 tablet (20 mg total) by mouth daily. What changed: when to take this   spironolactone  50 MG tablet Commonly known as: ALDACTONE  Take 1 tablet (50 mg total) by mouth daily. What changed: when to take this   torsemide  20 MG tablet Commonly known as: DEMADEX  Take 2 tablets (40 mg total) by mouth daily.   valsartan  320 MG tablet Commonly known as: DIOVAN  Take 1 tablet (320 mg total) by mouth daily.   Ventolin  HFA 108 (90 Base) MCG/ACT inhaler Generic drug: albuterol  Inhale 2 puffs into the  lungs every 4 (four) hours as needed for wheezing or shortness of breath.        Follow-up Information     Ngetich, [name], NP Follow up in 1 week(s).   Specialty: Family Medicine Contact information: [address] [place] [address] [phone]                Allergies  Allergen Reactions   Contrast Media [Iodinated Contrast Media] Hives and Swelling   The results of significant diagnostics from this hospitalization (including imaging, microbiology, ancillary and laboratory) are listed below for reference.    Microbiology: Recent Results (from the past 240 hours)  Resp panel by RT-PCR (RSV, Flu A&B, Covid) Anterior Nasal Swab     Status: Abnormal   Collection Time: 01/05/24  8:27 PM   Specimen: Anterior  Nasal Swab  Result Value Ref Range Status   SARS Coronavirus 2 by RT PCR POSITIVE (A) NEGATIVE Final    Comment: (NOTE) SARS-CoV-2 target nucleic acids are DETECTED.  The SARS-CoV-2 RNA is generally detectable in upper respiratory specimens during the acute phase of infection. Positive results are indicative of the presence of the identified virus, but do not rule out bacterial infection or co-infection with other pathogens not detected by the test. Clinical correlation with patient history and other diagnostic information is necessary to determine patient infection status. The expected result is Negative.  Fact Sheet for Patients: BloggerCourse.com  Fact Sheet for Healthcare Providers: SeriousBroker.it  This test is not yet approved or cleared by the [place]  FDA and  has been authorized for detection and/or diagnosis of SARS-CoV-2 by FDA under an Emergency Use Authorization (EUA).  This EUA will remain in effect (meaning this test can be used) for the duration of  the COVID-19 declaration under Section 564(b)(1) of the A ct, 21 U.S.C. section 360bbb-3(b)(1), unless the authorization is terminated or revoked sooner.     Influenza A by PCR NEGATIVE NEGATIVE Final   Influenza B by PCR NEGATIVE NEGATIVE Final    Comment: (NOTE) The Xpert Xpress SARS-CoV-2/FLU/RSV plus assay is intended as an aid in the diagnosis of influenza from Nasopharyngeal swab specimens and should not be used as a sole basis for treatment. Nasal washings and aspirates are unacceptable for Xpert Xpress SARS-CoV-2/FLU/RSV testing.  Fact Sheet for Patients: BloggerCourse.com  Fact Sheet for Healthcare Providers: SeriousBroker.it  This test is not yet approved or cleared by the [place]  FDA and has been authorized for detection and/or diagnosis of SARS-CoV-2 by FDA under an Emergency Use  Authorization (EUA). This EUA will remain in effect (meaning this test can be used) for the duration of the COVID-19 declaration under Section 564(b)(1) of the Act, 21 U.S.C. section 360bbb-3(b)(1), unless the authorization is terminated or revoked.     Resp Syncytial Virus by PCR NEGATIVE NEGATIVE Final    Comment: (NOTE) Fact Sheet for Patients: BloggerCourse.com  Fact Sheet for Healthcare Providers: SeriousBroker.it  This test is not yet approved or cleared by the [place]  FDA and has been authorized for detection and/or diagnosis of SARS-CoV-2 by FDA under an Emergency Use Authorization (EUA). This EUA will remain in effect (meaning this test can be used) for the duration of the COVID-19 declaration under Section 564(b)(1) of the Act, 21 U.S.C. section 360bbb-3(b)(1), unless the authorization is terminated or revoked.  Performed at [facility], [address], [place], [place] [address]     Procedures/Studies: DG Chest 2 View Result Date: 01/05/2024 CLINICAL DATA:  Shortness  of breath EXAM: CHEST - 2 VIEW COMPARISON:  10/30/2023 FINDINGS: Stable cardiomediastinal silhouette. No focal consolidation, pleural effusion, or pneumothorax. No displaced rib fractures. IMPRESSION: No active cardiopulmonary disease. Electronically Signed   By: Rozell Cornet M.D.   On: 01/05/2024 21:27   VAS US  ABI WITH/WO TBI Result Date: 01/03/2024  LOWER EXTREMITY DOPPLER STUDY Patient Name:  SAAMIYA JEPPSEN  Date of Exam:   01/03/2024 Medical Rec #: [id number]     Accession #:    [id number] Date of Birth: [birth year]     Patient Gender: F Patient Age:   89 years Exam Location:  [facility] Procedure:      VAS US  ABI WITH/WO TBI Referring Phys: JOSHUA ROBINS --------------------------------------------------------------------------------  Indications: Claudication, and peripheral artery disease. High Risk Factors:  Hypertension, hyperlipidemia, Diabetes, prior CVA.  Performing Technologist: Aloma Arrant RVS  Examination Guidelines: A complete evaluation includes at minimum, Doppler waveform signals and systolic blood pressure reading at the level of bilateral brachial, anterior tibial, and posterior tibial arteries, when vessel segments are accessible. Bilateral testing is considered an integral part of a complete examination. Photoelectric Plethysmograph (PPG) waveforms and toe systolic pressure readings are included as required and additional duplex testing as needed. Limited examinations for reoccurring indications may be performed as noted.  ABI Findings: +---------+------------------+-----+---------+--------+ Right    Rt Pressure (mmHg)IndexWaveform Comment  +---------+------------------+-----+---------+--------+ Brachial 182                                      +---------+------------------+-----+---------+--------+ PTA      203               1.12 triphasic         +---------+------------------+-----+---------+--------+ DP       217               1.19 triphasic         +---------+------------------+-----+---------+--------+ Great Toe156               0.86                   +---------+------------------+-----+---------+--------+ +---------+------------------+-----+---------+-------+ Left     Lt Pressure (mmHg)IndexWaveform Comment +---------+------------------+-----+---------+-------+ Brachial 174                                     +---------+------------------+-----+---------+-------+ PTA      174               0.96 triphasic        +---------+------------------+-----+---------+-------+ DP       182               1.00 triphasic        +---------+------------------+-----+---------+-------+ Great Toe170               0.93                  +---------+------------------+-----+---------+-------+ +-------+-----------+-----------+------------+------------+  ABI/TBIToday's ABIToday's TBIPrevious ABIPrevious TBI +-------+-----------+-----------+------------+------------+ Right  1.19       0.86                                +-------+-----------+-----------+------------+------------+ Left   1.00       0.93                                +-------+-----------+-----------+------------+------------+  Summary: Right: Resting right ankle-brachial index is within normal range. The right toe-brachial index is normal. Left: Resting left ankle-brachial index is within normal range. The left toe-brachial index is normal. *See table(s) above for measurements and observations.  Electronically signed by Irvin Mantel on 01/03/2024 at 5:53:27 PM.    Final     Labs: BNP (last 3 results) Recent Labs    01/22/23 1900 11/01/23 0631 01/05/24 2038  BNP 112.6* 124.9* 59.2   Basic Metabolic Panel: Recent Labs  Lab 01/05/24 2026 01/06/24 0341 01/06/24 0906 01/07/24 0533  NA 126* 134*  --  131*  K 4.0 3.7  --  3.8  CL 91* 100  --  98  CO2 26 27  --  26  GLUCOSE 529* 260*  --  289*  BUN 17 14  --  17  CREATININE 1.19* 1.01*  --  0.94  CALCIUM  9.4 9.2  --  8.5*  MG  --   --  1.9  --    Liver Function Tests: Recent Labs  Lab 01/07/24 0533  AST 19  ALT 22  ALKPHOS 74  BILITOT 0.3  PROT 6.5  ALBUMIN 2.5*   No results for input(s): "LIPASE", "AMYLASE" in the last 168 hours. No results for input(s): "AMMONIA" in the last 168 hours. CBC: Recent Labs  Lab 01/05/24 2026 01/07/24 0533  WBC 8.4 6.0  NEUTROABS 4.5  --   HGB 10.8* 10.9*  HCT 32.4* 34.1*  MCV 81.0 83.6  PLT 286 237  Urinalysis    Component Value Date/Time   COLORURINE COLORLESS (A) 10/29/2023 2325   APPEARANCEUR CLEAR 10/29/2023 2325   LABSPEC 1.023 10/29/2023 2325   PHURINE 6.5 10/29/2023 2325   GLUCOSEU >1,000 (A) 10/29/2023 2325   HGBUR SMALL (A) 10/29/2023 2325   BILIRUBINUR NEGATIVE 10/29/2023 2325   BILIRUBINUR NEG 08/03/2020 1113   KETONESUR NEGATIVE  10/29/2023 2325   PROTEINUR 100 (A) 10/29/2023 2325   UROBILINOGEN 0.2 08/03/2020 1113   UROBILINOGEN 1.0 01/19/2012 0015   NITRITE NEGATIVE 10/29/2023 2325   LEUKOCYTESUR SMALL (A) 10/29/2023 2325   Sepsis Labs Recent Labs  Lab 01/05/24 2026 01/07/24 0533  WBC 8.4 6.0   Microbiology Recent Results (from the past 240 hours)  Resp panel by RT-PCR (RSV, Flu A&B, Covid) Anterior Nasal Swab     Status: Abnormal   Collection Time: 01/05/24  8:27 PM   Specimen: Anterior Nasal Swab  Result Value Ref Range Status   SARS Coronavirus 2 by RT PCR POSITIVE (A) NEGATIVE Final    Comment: (NOTE) SARS-CoV-2 target nucleic acids are DETECTED.  The SARS-CoV-2 RNA is generally detectable in upper respiratory specimens during the acute phase of infection. Positive results are indicative of the presence of the identified virus, but do not rule out bacterial infection or co-infection with other pathogens not detected by the test. Clinical correlation with patient history and other diagnostic information is necessary to determine patient infection status. The expected result is Negative.  Fact Sheet for Patients: BloggerCourse.com  Fact Sheet for Healthcare Providers: SeriousBroker.it  This test is not yet approved or cleared by the [place]  FDA and  has been authorized for detection and/or diagnosis of SARS-CoV-2 by FDA under an Emergency Use Authorization (EUA).  This EUA will remain in effect (meaning this test can be used) for the duration of  the COVID-19 declaration under Section 564(b)(1) of the A ct, 21 U.S.C. section 360bbb-3(b)(1), unless the authorization is terminated or revoked sooner.     Influenza  A by PCR NEGATIVE NEGATIVE Final   Influenza B by PCR NEGATIVE NEGATIVE Final    Comment: (NOTE) The Xpert Xpress SARS-CoV-2/FLU/RSV plus assay is intended as an aid in the diagnosis of influenza from Nasopharyngeal swab  specimens and should not be used as a sole basis for treatment. Nasal washings and aspirates are unacceptable for Xpert Xpress SARS-CoV-2/FLU/RSV testing.  Fact Sheet for Patients: BloggerCourse.com  Fact Sheet for Healthcare Providers: SeriousBroker.it  This test is not yet approved or cleared by the [place]  FDA and has been authorized for detection and/or diagnosis of SARS-CoV-2 by FDA under an Emergency Use Authorization (EUA). This EUA will remain in effect (meaning this test can be used) for the duration of the COVID-19 declaration under Section 564(b)(1) of the Act, 21 U.S.C. section 360bbb-3(b)(1), unless the authorization is terminated or revoked.     Resp Syncytial Virus by PCR NEGATIVE NEGATIVE Final    Comment: (NOTE) Fact Sheet for Patients: BloggerCourse.com  Fact Sheet for Healthcare Providers: SeriousBroker.it  This test is not yet approved or cleared by the [place]  FDA and has been authorized for detection and/or diagnosis of SARS-CoV-2 by FDA under an Emergency Use Authorization (EUA). This EUA will remain in effect (meaning this test can be used) for the duration of the COVID-19 declaration under Section 564(b)(1) of the Act, 21 U.S.C. section 360bbb-3(b)(1), unless the authorization is terminated or revoked.  Performed at [facility], [address] [place], [place] [address]    Time coordinating discharge: 35 minutes  SIGNED: Lesa Rape, MD  [facility] 01/08/2024, 12:52 PM  If 7PM-7AM, please contact night-coverage [email or web address]

## 2024-01-11 ENCOUNTER — Other Ambulatory Visit: Payer: Self-pay

## 2024-01-15 ENCOUNTER — Other Ambulatory Visit: Payer: Self-pay

## 2024-01-15 ENCOUNTER — Other Ambulatory Visit

## 2024-01-17 ENCOUNTER — Other Ambulatory Visit: Payer: Self-pay | Admitting: *Deleted

## 2024-01-17 DIAGNOSIS — R600 Localized edema: Secondary | ICD-10-CM

## 2024-01-17 DIAGNOSIS — I739 Peripheral vascular disease, unspecified: Secondary | ICD-10-CM

## 2024-01-25 ENCOUNTER — Ambulatory Visit (HOSPITAL_COMMUNITY): Admission: RE | Admit: 2024-01-25 | Source: Ambulatory Visit

## 2024-01-28 ENCOUNTER — Ambulatory Visit: Admitting: Vascular Surgery

## 2024-02-01 ENCOUNTER — Emergency Department (HOSPITAL_BASED_OUTPATIENT_CLINIC_OR_DEPARTMENT_OTHER)

## 2024-02-01 ENCOUNTER — Emergency Department (HOSPITAL_BASED_OUTPATIENT_CLINIC_OR_DEPARTMENT_OTHER)
Admission: EM | Admit: 2024-02-01 | Discharge: 2024-02-02 | Disposition: A | Discharge status: Home / Self Care | Attending: Emergency Medicine | Admitting: Emergency Medicine

## 2024-02-01 DIAGNOSIS — Z79899 Other long term (current) drug therapy: Secondary | ICD-10-CM | POA: Diagnosis not present

## 2024-02-01 DIAGNOSIS — E1165 Type 2 diabetes mellitus with hyperglycemia: Secondary | ICD-10-CM | POA: Diagnosis not present

## 2024-02-01 DIAGNOSIS — Z7951 Long term (current) use of inhaled steroids: Secondary | ICD-10-CM | POA: Diagnosis not present

## 2024-02-01 DIAGNOSIS — Z7984 Long term (current) use of oral hypoglycemic drugs: Secondary | ICD-10-CM | POA: Insufficient documentation

## 2024-02-01 DIAGNOSIS — I130 Hypertensive heart and chronic kidney disease with heart failure and stage 1 through stage 4 chronic kidney disease, or unspecified chronic kidney disease: Secondary | ICD-10-CM | POA: Diagnosis not present

## 2024-02-01 DIAGNOSIS — I509 Heart failure, unspecified: Secondary | ICD-10-CM | POA: Diagnosis not present

## 2024-02-01 DIAGNOSIS — N189 Chronic kidney disease, unspecified: Secondary | ICD-10-CM | POA: Diagnosis not present

## 2024-02-01 DIAGNOSIS — Z794 Long term (current) use of insulin: Secondary | ICD-10-CM | POA: Insufficient documentation

## 2024-02-01 DIAGNOSIS — R0789 Other chest pain: Secondary | ICD-10-CM | POA: Diagnosis present

## 2024-02-01 DIAGNOSIS — Z7982 Long term (current) use of aspirin: Secondary | ICD-10-CM | POA: Insufficient documentation

## 2024-02-01 DIAGNOSIS — R079 Chest pain, unspecified: Secondary | ICD-10-CM

## 2024-02-01 DIAGNOSIS — E1122 Type 2 diabetes mellitus with diabetic chronic kidney disease: Secondary | ICD-10-CM | POA: Diagnosis not present

## 2024-02-01 DIAGNOSIS — R739 Hyperglycemia, unspecified: Secondary | ICD-10-CM

## 2024-02-01 LAB — I-STAT VENOUS BLOOD GAS, ED
Acid-Base Excess: 2 mmol/L (ref 0.0–2.0)
Bicarbonate: 24.1 mmol/L (ref 20.0–28.0)
Calcium, Ion: 0.96 mmol/L — ABNORMAL LOW (ref 1.15–1.40)
HCT: 35 % — ABNORMAL LOW (ref 36.0–46.0)
Hemoglobin: 11.9 g/dL — ABNORMAL LOW (ref 12.0–15.0)
O2 Saturation: 96 %
Patient temperature: 97.7
Potassium: 4.2 mmol/L (ref 3.5–5.1)
Sodium: 127 mmol/L — ABNORMAL LOW (ref 135–145)
TCO2: 25 mmol/L (ref 22–32)
pCO2, Ven: 28.8 mmHg — ABNORMAL LOW (ref 44–60)
pH, Ven: 7.528 — ABNORMAL HIGH (ref 7.25–7.43)
pO2, Ven: 68 mmHg — ABNORMAL HIGH (ref 32–45)

## 2024-02-01 LAB — CBC WITH DIFFERENTIAL/PLATELET
Abs Immature Granulocytes: 0.02 10*3/uL (ref 0.00–0.07)
Basophils Absolute: 0 10*3/uL (ref 0.0–0.1)
Basophils Relative: 0 %
Eosinophils Absolute: 0.2 10*3/uL (ref 0.0–0.5)
Eosinophils Relative: 2 %
HCT: 35.6 % — ABNORMAL LOW (ref 36.0–46.0)
Hemoglobin: 12 g/dL (ref 12.0–15.0)
Immature Granulocytes: 0 %
Lymphocytes Relative: 42 %
Lymphs Abs: 4.2 10*3/uL — ABNORMAL HIGH (ref 0.7–4.0)
MCH: 27.5 pg (ref 26.0–34.0)
MCHC: 33.7 g/dL (ref 30.0–36.0)
MCV: 81.7 fL (ref 80.0–100.0)
Monocytes Absolute: 0.6 10*3/uL (ref 0.1–1.0)
Monocytes Relative: 6 %
Neutro Abs: 4.8 10*3/uL (ref 1.7–7.7)
Neutrophils Relative %: 50 %
Platelets: 289 10*3/uL (ref 150–400)
RBC: 4.36 MIL/uL (ref 3.87–5.11)
RDW: 13.7 % (ref 11.5–15.5)
WBC: 9.9 10*3/uL (ref 4.0–10.5)
nRBC: 0 % (ref 0.0–0.2)

## 2024-02-01 LAB — COMPREHENSIVE METABOLIC PANEL WITH GFR
ALT: 14 U/L (ref 0–44)
AST: 17 U/L (ref 15–41)
Albumin: 3.4 g/dL — ABNORMAL LOW (ref 3.5–5.0)
Alkaline Phosphatase: 119 U/L (ref 38–126)
Anion gap: 13 (ref 5–15)
BUN: 18 mg/dL (ref 6–20)
CO2: 24 mmol/L (ref 22–32)
Calcium: 9.2 mg/dL (ref 8.9–10.3)
Chloride: 90 mmol/L — ABNORMAL LOW (ref 98–111)
Creatinine, Ser: 1.18 mg/dL — ABNORMAL HIGH (ref 0.44–1.00)
GFR, Estimated: >60 mL/min (ref >60)
Glucose, Bld: 615 mg/dL (ref 70–99)
Potassium: 4.1 mmol/L (ref 3.5–5.1)
Sodium: 127 mmol/L — ABNORMAL LOW (ref 135–145)
Total Bilirubin: <0.2 mg/dL (ref 0.0–1.2)
Total Protein: 7.2 g/dL (ref 6.5–8.1)

## 2024-02-01 LAB — PREGNANCY, URINE: Preg Test, Ur: NEGATIVE

## 2024-02-01 LAB — PRO BRAIN NATRIURETIC PEPTIDE: Pro Brain Natriuretic Peptide: 353 pg/mL — ABNORMAL HIGH (ref <300.0)

## 2024-02-01 LAB — CBG MONITORING, ED: Glucose-Capillary: >600 mg/dL (ref 70–99)

## 2024-02-01 LAB — TROPONIN T, HIGH SENSITIVITY: Troponin T High Sensitivity: 29 ng/L — ABNORMAL HIGH (ref <19)

## 2024-02-01 MED ORDER — INSULIN ASPART 100 UNIT/ML IJ SOLN
10.0000 [IU] | Freq: Once | INTRAMUSCULAR | Status: AC
  Administered 2024-02-02: 10 [IU] via SUBCUTANEOUS

## 2024-02-01 MED ORDER — SODIUM CHLORIDE 0.9 % IV BOLUS
1000.0000 mL | Freq: Once | INTRAVENOUS | Status: AC
  Administered 2024-02-01: 1000 mL via INTRAVENOUS

## 2024-02-01 NOTE — ED Provider Notes (Signed)
 [place] EMERGENCY DEPARTMENT AT [facility]  Provider Note  CSN: [id number] Arrival date & time: 02/01/24 2210  History Chief Complaint  Patient presents with   Chest Pain    Carolyn Zimmerman is a 38 y.o. female with history of poorly controlled HTN, DM, CKD, CHF, CVA and PVD reports episodes of chest pressure off and on for the last few days but worse tonight. She initially thought it was indigestion but when she began to feel SOB tonight she became more worried. She used her inhaler without change. She is currently chest pain free. Denies any leg swelling. Was discharged from [facility] 4/25 after an admission for pneumonia and AKI/hyponatremia.    Home Medications Prior to Admission medications   Medication Sig Start Date End Date Taking? Authorizing Provider  albuterol  (VENTOLIN  HFA) 108 (90 Base) MCG/ACT inhaler Inhale 2 puffs into the lungs every 4 (four) hours as needed for wheezing or shortness of breath. 11/02/23   Gherghe, Costin M, MD  ALEVE  220 MG tablet Take 1,100 mg by mouth daily as needed (for headaches or mild pain).    [provider]  amLODipine  (NORVASC ) 10 MG tablet Take 1 tablet (10 mg total) by mouth daily. Patient taking differently: Take 10 mg by mouth at bedtime. 11/02/23   Gherghe, Costin M, MD  aspirin  81 MG chewable tablet Chew 1 tablet (81 mg total) by mouth daily. Patient taking differently: Chew 81 mg by mouth at bedtime. 10/29/23   Ngetich, [name], NP  carvedilol  (COREG ) 25 MG tablet Take 1 tablet (25 mg total) by mouth 2 (two) times daily. 11/02/23   Gherghe, Costin M, MD  hydrochlorothiazide  (HYDRODIURIL ) 25 MG tablet Take 1 tablet (25 mg total) by mouth daily. 11/02/23 01/31/24  Gherghe, Costin M, MD  Insulin  Glargine Solostar (LANTUS ) 100 UNIT/ML Solostar Pen Inject 38 Units into the skin daily. 01/08/24   Lesa Rape, MD  insulin  lispro (HUMALOG ) 100 UNIT/ML KwikPen Inject 22 Units into the skin 3 (three) times daily. 01/07/24   Ngetich, [name],  NP  metFORMIN  (GLUCOPHAGE -XR) 500 MG 24 hr tablet Take 2 tablets (1,000 mg total) by mouth in the morning and at bedtime. 11/02/23   Gherghe, Costin M, MD  rosuvastatin  (CRESTOR ) 20 MG tablet Take 1 tablet (20 mg total) by mouth daily. Patient taking differently: Take 20 mg by mouth at bedtime. 11/02/23   Gherghe, Costin M, MD  spironolactone  (ALDACTONE ) 50 MG tablet Take 1 tablet (50 mg total) by mouth daily. Patient taking differently: Take 50 mg by mouth at bedtime. 11/02/23   Gherghe, Costin M, MD  torsemide  (DEMADEX ) 20 MG tablet Take 2 tablets (40 mg total) by mouth daily. 11/02/23 01/31/24  Gherghe, Costin M, MD  valsartan  (DIOVAN ) 320 MG tablet Take 1 tablet (320 mg total) by mouth daily. 11/02/23   Gherghe, Costin M, MD     Allergies    Contrast media [iodinated contrast media]   Review of Systems   Review of Systems Please see HPI for pertinent positives and negatives  Physical Exam BP (!) 143/77   Pulse 78   Temp 98.3 F (36.8 C) (Oral)   Resp 20   LMP 12/31/2023   SpO2 100%   Physical Exam Vitals and nursing note reviewed.  Constitutional:      Appearance: Normal appearance.  HENT:     Head: Normocephalic and atraumatic.     Nose: Nose normal.     Mouth/Throat:     Mouth: Mucous membranes are moist.  Eyes:     Extraocular Movements: Extraocular movements intact.     Conjunctiva/sclera: Conjunctivae normal.  Cardiovascular:     Rate and Rhythm: Normal rate.  Pulmonary:     Effort: Pulmonary effort is normal.     Breath sounds: Normal breath sounds.  Chest:     Chest wall: Tenderness present.  Abdominal:     General: Abdomen is flat.     Palpations: Abdomen is soft.     Tenderness: There is no abdominal tenderness.  Musculoskeletal:        General: No swelling. Normal range of motion.     Cervical back: Neck supple.     Right lower leg: No edema.     Left lower leg: No edema.  Skin:    General: Skin is warm and dry.  Neurological:     General: No focal  deficit present.     Mental Status: She is alert.  Psychiatric:        Mood and Affect: Mood normal.     ED Results / Procedures / Treatments   EKG EKG Interpretation Date/Time:  Friday Feb 01 2024 22:17:23 EDT Ventricular Rate:  86 PR Interval:  174 QRS Duration:  130 QT Interval:  422 QTC Calculation: 505 R Axis:   53  Text Interpretation: Sinus rhythm Probable left ventricular hypertrophy Abnormal T, consider ischemia, lateral leads Prolonged QT interval Confirmed by Elise Guile [phone]) on 02/01/2024 10:44:49 PM  Procedures Procedures  Medications Ordered in the ED Medications  sodium chloride  0.9 % bolus 1,000 mL (0 mLs Intravenous Stopped 02/02/24 0104)  insulin  aspart (novoLOG ) injection 10 Units (10 Units Subcutaneous Given 02/02/24 0010)    Initial Impression and Plan  Patient with multiple chronic medical problems here for evaluation of chest pain, feeling better now. EKG without ischemic changes. Labs done in triage show unremarkable CBC, chemistries were hemolyzed and re-drawn but CBG is >600. VBG without acidosis. She reports this is 'her normal'. She reports compliance with her insulin  regimen she was given at hospital discharge and is awaiting an endocrinology appointment next month.   ED Course   Clinical Course as of 02/02/24 0203  Fri Feb 01, 2024  2355 CMP with hyperglycemia, pseudohyponatremia. Initial Trop is mildly elevated, no old to compare. Will check a delta.  [CS]  Sat Feb 02, 2024  0140 Repeat trop remains flat. Will recheck CBG.  [CS]  0152 CBG improving but still elevated. Patient resting comfortably. Discussed results with her including inpatient vs outpatient management. She does not want to be admitted. She is comfortable managing her glucose at home, although she admits to dietary indiscretion. She was advised that given her risk factors, underlying CAD cannot be ruled out in the ED and that elevated glucose can cause long term effects such as  blindness and ESRD. She understands these risks and will RTED if her symptoms worsen or if she has any other concerns. Otherwise continue home medications, avoid sugar/carbs, PCP follow up.  [CS]    Clinical Course User Index [CS] Charmayne Cooper, MD     MDM Rules/Calculators/A&P Medical Decision Making Problems Addressed: Chest pain, unspecified type: acute illness or injury Chronic kidney disease, unspecified CKD stage: chronic illness or injury Hyperglycemia: chronic illness or injury  Amount and/or Complexity of Data Reviewed Labs: ordered. Decision-making details documented in ED Course. Radiology: ordered and independent interpretation performed. Decision-making details documented in ED Course. ECG/medicine tests: ordered and independent interpretation performed. Decision-making details documented in ED Course.  Risk Prescription drug management. Decision regarding hospitalization.     Final Clinical Impression(s) / ED Diagnoses Final diagnoses:  Chest pain, unspecified type  Hyperglycemia  Chronic kidney disease, unspecified CKD stage    Rx / DC Orders ED Discharge Orders     None        Charmayne Cooper, MD 02/02/24 [phone]

## 2024-02-01 NOTE — ED Triage Notes (Signed)
 Pt reports she is having chest tightness. Started a few hours ago. She thought heart burn. Took shower and laid down and kept getting tighter. She googled symptoms of heart attack and decided to come in. Used inhaler at home.

## 2024-02-02 LAB — CBG MONITORING, ED: Glucose-Capillary: 538 mg/dL (ref 70–99)

## 2024-02-02 LAB — TROPONIN T, HIGH SENSITIVITY: Troponin T High Sensitivity: 30 ng/L — ABNORMAL HIGH (ref <19)

## 2024-02-02 NOTE — ED Notes (Signed)
 Pharm called to verify insulin 

## 2024-02-06 ENCOUNTER — Other Ambulatory Visit: Payer: Self-pay

## 2024-02-06 DIAGNOSIS — I872 Venous insufficiency (chronic) (peripheral): Secondary | ICD-10-CM

## 2024-02-21 ENCOUNTER — Ambulatory Visit (HOSPITAL_COMMUNITY)
Admission: RE | Admit: 2024-02-21 | Discharge: 2024-02-21 | Disposition: A | Discharge status: Home / Self Care | Source: Ambulatory Visit | Attending: Vascular Surgery | Admitting: Vascular Surgery

## 2024-02-21 DIAGNOSIS — I872 Venous insufficiency (chronic) (peripheral): Secondary | ICD-10-CM

## 2024-03-12 DIAGNOSIS — E113211 Type 2 diabetes mellitus with mild nonproliferative diabetic retinopathy with macular edema, right eye: Secondary | ICD-10-CM | POA: Insufficient documentation

## 2024-03-13 ENCOUNTER — Ambulatory Visit: Attending: Vascular Surgery | Admitting: Vascular Surgery

## 2024-03-13 DIAGNOSIS — I890 Lymphedema, not elsewhere classified: Secondary | ICD-10-CM | POA: Diagnosis present

## 2024-03-13 DIAGNOSIS — R609 Edema, unspecified: Secondary | ICD-10-CM | POA: Diagnosis present

## 2024-03-13 DIAGNOSIS — R600 Localized edema: Secondary | ICD-10-CM | POA: Insufficient documentation

## 2024-03-13 NOTE — Progress Notes (Signed)
 Office Note    HPI: Carolyn Zimmerman is a 37 y.o. ([birth year]) female presenting via phone call for follow-up of right lower extremity swelling, diminished pulses.    At her last visit ABI demonstrated normal perfusion bilaterally.  We discussed obesity, his recent cellulitic episode that is likely contributing to her lower extremity edema.  Her phone call today was to discuss recent lower extremity venous reflux study to ensure there was not a venous reflux component.   Carolyn Zimmerman was at her home, doing well.  She has had continued lower extremity edema, and stated it has remained stable since her visit with me in April.  She has worn compression intermittently.   Medical history as outlined below but in short she was previously hospitalized for right lower extremity cellulitis, uncontrolled hypertension.  Since her hospitalization, she has appreciated bilateral lower extremity edema, right greater than left.  The edema extended into the toes, and improve her diuretics.  She also notes some neuropathy in the feet with an uncontrolled hemoglobin A1c of 11.3.  She denies claudication, ischemic rest pain, tissue loss.  The skin is very sensitive to palpation.  No history of vascular or venous surgeries    Past Medical History:  Diagnosis Date   Abscess of chest wall 06/15/2017   Coronavirus infection 05/02/2020   CVA (cerebral vascular accident) (HCC) 07/14/2022   L MCA CVA, watershed infarct   Diabetes mellitus    Heart failure with mildly reduced ejection fraction (HFmrEF) (HCC) 09/29/2021   EF 45%, moderate LVH   Hyperlipidemia associated with type 2 diabetes mellitus (HCC)    Hypertension    Jaundice    Obesity    TOA (tubo-ovarian abscess) 09/2019    Past Surgical History:  Procedure Laterality Date   CHOLECYSTECTOMY     IRRIGATION AND DEBRIDEMENT ABSCESS N/A 05/02/2020   Procedure: IRRIGATION AND DEBRIDEMENT ABDOMINAL and CHEST WALL NECROTIZING FASCITITS;  Surgeon: Sheldon Standing, MD;   Location: [facility];  Service: General;  Laterality: N/A;   liver stent      Social History   Socioeconomic History   Marital status: Single    Spouse name: Not on file   Number of children: Not on file   Years of education: Not on file   Highest education level: GED or equivalent  Occupational History   Not on file  Tobacco Use   Smoking status: Former    Current packs/day: 0.00    Types: Cigarettes    Quit date: 10/08/2023    Years since quitting: 0.4   Smokeless tobacco: Never  Vaping Use   Vaping status: Former  Substance and Sexual Activity   Alcohol use: Not Currently    Comment: occ   Drug use: No   Sexual activity: Not on file  Other Topics Concern   Not on file  Social History Narrative   Are you right handed or left handed? right   Are you currently employed ? yes   What is your current occupation?[employer]   Do you live at home alone?with kids   Who lives with you?    What type of home do you live in: 1 story or 2 story? two    Caffeine 1-3 a day   Social Drivers of Health   Financial Resource Strain: High Risk (10/29/2023)   Overall Financial Resource Strain (CARDIA)    Difficulty of Paying Living Expenses: Very hard  Food Insecurity: Medium Risk (01/08/2024)   Received from [facility]   Hunger Vital Sign  Within the past 12 months, you worried that your food would run out before you got money to buy more: Sometimes true    Within the past 12 months, the food you bought just didn't last and you didn't have money to get more. : Sometimes true  Transportation Needs: No Transportation Needs (01/08/2024)   Received from [facility]   Transportation    In the past 12 months, has lack of reliable transportation kept you from medical appointments, meetings, work or from getting things needed for daily living? : No  Physical Activity: Unknown (10/29/2023)   Exercise Vital Sign    Days of Exercise per Week: 0 days    Minutes of Exercise per Session: Not on file   Stress: Stress Concern Present (10/29/2023)   [employer] of Occupational Health - Occupational Stress Questionnaire    Feeling of Stress : To some extent  Social Connections: Moderately Isolated (01/06/2024)   Social Connection and Isolation Panel    Frequency of Communication with Friends and Family: More than three times a week    Frequency of Social Gatherings with Friends and Family: More than three times a week    Attends Religious Services: 1 to 4 times per year    Active Member of [employer] or Organizations: No    Attends Banker Meetings: Never    Marital Status: Separated  Intimate Partner Violence: Not At Risk (01/06/2024)   Humiliation, Afraid, Rape, and Kick questionnaire    Fear of Current or Ex-Partner: No    Emotionally Abused: No    Physically Abused: No    Sexually Abused: No   Family History  Problem Relation Age of Onset   Hypertension Mother    Diabetes Father    Hypertension Father     Current Outpatient Medications  Medication Sig Dispense Refill   albuterol  (VENTOLIN  HFA) 108 (90 Base) MCG/ACT inhaler Inhale 2 puffs into the lungs every 4 (four) hours as needed for wheezing or shortness of breath. 18 g 0   ALEVE  220 MG tablet Take 1,100 mg by mouth daily as needed (for headaches or mild pain).     amLODipine  (NORVASC ) 10 MG tablet Take 1 tablet (10 mg total) by mouth daily. (Patient taking differently: Take 10 mg by mouth at bedtime.) 90 tablet 0   aspirin  81 MG chewable tablet Chew 1 tablet (81 mg total) by mouth daily. (Patient taking differently: Chew 81 mg by mouth at bedtime.) 90 tablet 1   carvedilol  (COREG ) 25 MG tablet Take 1 tablet (25 mg total) by mouth 2 (two) times daily. 180 tablet 0   hydrochlorothiazide  (HYDRODIURIL ) 25 MG tablet Take 1 tablet (25 mg total) by mouth daily. 90 tablet 0   Insulin  Glargine Solostar (LANTUS ) 100 UNIT/ML Solostar Pen Inject 38 Units into the skin daily.     insulin  lispro (HUMALOG ) 100 UNIT/ML KwikPen  Inject 22 Units into the skin 3 (three) times daily. 30 mL 1   metFORMIN  (GLUCOPHAGE -XR) 500 MG 24 hr tablet Take 2 tablets (1,000 mg total) by mouth in the morning and at bedtime. 360 tablet 0   rosuvastatin  (CRESTOR ) 20 MG tablet Take 1 tablet (20 mg total) by mouth daily. (Patient taking differently: Take 20 mg by mouth at bedtime.) 90 tablet 0   spironolactone  (ALDACTONE ) 50 MG tablet Take 1 tablet (50 mg total) by mouth daily. (Patient taking differently: Take 50 mg by mouth at bedtime.) 90 tablet 0   torsemide  (DEMADEX ) 20 MG tablet Take 2  tablets (40 mg total) by mouth daily. 180 tablet 0   valsartan  (DIOVAN ) 320 MG tablet Take 1 tablet (320 mg total) by mouth daily. 90 tablet 0   No current facility-administered medications for this visit.    Allergies  Allergen Reactions   Contrast Media [Iodinated Contrast Media] Hives and Swelling     REVIEW OF SYSTEMS:  [X]  denotes positive finding, [ ]  denotes negative finding Cardiac  Comments:  Chest pain or chest pressure:    Shortness of breath upon exertion:    Short of breath when lying flat:    Irregular heart rhythm:        Vascular    Pain in calf, thigh, or hip brought on by ambulation:    Pain in feet at night that wakes you up from your sleep:     Blood clot in your veins:    Leg swelling:         Pulmonary    Oxygen at home:    Productive cough:     Wheezing:         Neurologic    Sudden weakness in arms or legs:     Sudden numbness in arms or legs:     Sudden onset of difficulty speaking or slurred speech:    Temporary loss of vision in one eye:     Problems with dizziness:         Gastrointestinal    Blood in stool:     Vomited blood:         Genitourinary    Burning when urinating:     Blood in urine:        Psychiatric    Major depression:         Hematologic    Bleeding problems:    Problems with blood clotting too easily:        Skin    Rashes or ulcers:        Constitutional    Fever or  chills:      PHYSICAL EXAMINATION: (from previous visit)  There were no vitals filed for this visit.  General:  WDWN in NAD; vital signs documented above Gait: Not observed HENT: WNL, normocephalic Pulmonary: normal non-labored breathing , without wheezing Cardiac: regular HR Abdomen: soft, NT, no masses Skin: without rashes Vascular Exam/Pulses:  Right Left  Radial 2+ (normal) 2+ (normal)  Ulnar    Femoral    Popliteal    DP 2+ (normal) 2+ (normal)  PT     Extremities: without ischemic changes, without Gangrene , without cellulitis; without open wounds;  Bilateral lower extremity edema, right greater than left.  2+. Musculoskeletal: no muscle wasting or atrophy  Neurologic: A&O X 3;  No focal weakness or paresthesias are detected Psychiatric:  The pt has Normal affect.   Non-Invasive Vascular Imaging:   DVT study negative   ABI Findings:  +---------+------------------+-----+---------+--------+  Right   Rt Pressure (mmHg)IndexWaveform Comment   +---------+------------------+-----+---------+--------+  Brachial 182                                       +---------+------------------+-----+---------+--------+  PTA     203               1.12 triphasic          +---------+------------------+-----+---------+--------+  DP      217  1.19 triphasic          +---------+------------------+-----+---------+--------+  Great Toe156               0.86                    +---------+------------------+-----+---------+--------+   +---------+------------------+-----+---------+-------+  Left    Lt Pressure (mmHg)IndexWaveform Comment  +---------+------------------+-----+---------+-------+  Brachial 174                                      +---------+------------------+-----+---------+-------+  PTA     174               0.96 triphasic         +---------+------------------+-----+---------+-------+  DP      182                1.00 triphasic         +---------+------------------+-----+---------+-------+  Great Toe170               0.93                   +---------+------------------+-----+---------+-------+   +-------+-----------+-----------+------------+------------+  ABI/TBIToday's ABIToday's TBIPrevious ABIPrevious TBI  +-------+-----------+-----------+------------+------------+  Right 1.19       0.86                                 +-------+-----------+-----------+------------+------------+  Left  1.00       0.93                                 +-------+-----------+-----------+------------+------------+   Venous Reflux Times  +--------------+---------+------+-----------+------------+-------------+  RIGHT        Reflux NoRefluxReflux TimeDiameter cmsComments                               Yes                                        +--------------+---------+------+-----------+------------+-------------+  CFV                    yes   >1 second                            +--------------+---------+------+-----------+------------+-------------+  FV mid        no                                                   +--------------+---------+------+-----------+------------+-------------+  Popliteal    no                                                   +--------------+---------+------+-----------+------------+-------------+  GSV at SFJ    no                            .  57                    +--------------+---------+------+-----------+------------+-------------+  GSV prox thighno                            .33                    +--------------+---------+------+-----------+------------+-------------+  GSV mid thigh no                            .35                    +--------------+---------+------+-----------+------------+-------------+  GSV dist thighno                            .33     out of fascia   +--------------+---------+------+-----------+------------+-------------+  GSV at knee   no                            .35                    +--------------+---------+------+-----------+------------+-------------+  GSV prox calf no                            .28                    +--------------+---------+------+-----------+------------+-------------+  SSV Pop Fossa no                            .14                    +--------------+---------+------+-----------+------------+-------------+  SSV prox calf no                            .13                    +--------------+---------+------+-----------+------------+-------------+     ASSESSMENT/PLAN: Carl Bleecker is a 38 y.o. female presenting with bilateral lower extremity edema, right greater than left.  I reiterated our conversation from April. With cellulitis in the right lower extremity, my guess is that she has some secondary lymphedema due to sclerosis of lymphatic channels from the cellulitis.  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 Other etiologies include heart failure with medication noncompliance.  She states that she sometimes increases her dose from the prescribed amount in an effort to help.    Venous reflux ultrasound was reviewed demonstrating only mild reflux of the common femoral vein.  She does not have significant reflux that would explain her symptoms.  At this point, I think she would benefit from referral to the lymphedema center for evaluation.  With her multiple medical comorbidities, I am unsure if she is a candidate for bariatric surgery which would also help with lower extremity edema by helping to reverse the leg edema.  I will ensure that this message is sent to [facility], her primary care for referral to the lymphedema center in [facility] [address] as my office does not manage lymphedema.   Fonda FORBES Rim, MD [facility] [phone] Total time of  patient care including pre-visit research,  phone consultation, and documentation greater than 30 minutes

## 2024-05-14 ENCOUNTER — Encounter: Payer: Self-pay | Admitting: Family

## 2024-05-14 ENCOUNTER — Ambulatory Visit: Payer: Medicaid Other | Admitting: Family

## 2024-05-14 VITALS — BP 142/96 | HR 84 | Temp 97.8°F | Resp 20 | Ht 67.0 in | Wt 291.2 lb

## 2024-05-14 DIAGNOSIS — I10 Essential (primary) hypertension: Secondary | ICD-10-CM | POA: Diagnosis not present

## 2024-05-14 DIAGNOSIS — F411 Generalized anxiety disorder: Secondary | ICD-10-CM

## 2024-05-14 DIAGNOSIS — E1165 Type 2 diabetes mellitus with hyperglycemia: Secondary | ICD-10-CM | POA: Diagnosis not present

## 2024-05-14 DIAGNOSIS — E782 Mixed hyperlipidemia: Secondary | ICD-10-CM | POA: Diagnosis not present

## 2024-05-14 DIAGNOSIS — I5022 Chronic systolic (congestive) heart failure: Secondary | ICD-10-CM | POA: Diagnosis not present

## 2024-05-14 DIAGNOSIS — Z794 Long term (current) use of insulin: Secondary | ICD-10-CM

## 2024-05-14 DIAGNOSIS — R350 Frequency of micturition: Secondary | ICD-10-CM

## 2024-05-14 DIAGNOSIS — Z8673 Personal history of transient ischemic attack (TIA), and cerebral infarction without residual deficits: Secondary | ICD-10-CM

## 2024-05-14 LAB — CBC WITH DIFFERENTIAL/PLATELET
Absolute Lymphocytes: 2510 {cells}/uL (ref 850–3900)
Absolute Monocytes: 819 {cells}/uL (ref 200–950)
Basophils Absolute: 45 {cells}/uL (ref 0–200)
Basophils Relative: 0.5 %
Eosinophils Absolute: 98 {cells}/uL (ref 15–500)
Eosinophils Relative: 1.1 %
HCT: 38.6 % (ref 35.0–45.0)
Hemoglobin: 11.8 g/dL (ref 11.7–15.5)
MCH: 26 pg — ABNORMAL LOW (ref 27.0–33.0)
MCHC: 30.6 g/dL — ABNORMAL LOW (ref 32.0–36.0)
MCV: 85 fL (ref 80.0–100.0)
MPV: 10.7 fL (ref 7.5–12.5)
Monocytes Relative: 9.2 %
Neutro Abs: 5429 {cells}/uL (ref 1500–7800)
Neutrophils Relative %: 61 %
Platelets: 411 Thousand/uL — ABNORMAL HIGH (ref 140–400)
RBC: 4.54 Million/uL (ref 3.80–5.10)
RDW: 12.9 % (ref 11.0–15.0)
Total Lymphocyte: 28.2 %
WBC: 8.9 Thousand/uL (ref 3.8–10.8)

## 2024-05-14 LAB — HEPATIC FUNCTION PANEL
AG Ratio: 0.9 (calc) — ABNORMAL LOW (ref 1.0–2.5)
ALT: 14 U/L (ref 6–29)
AST: 14 U/L (ref 10–30)
Albumin: 3.5 g/dL — ABNORMAL LOW (ref 3.6–5.1)
Alkaline phosphatase (APISO): 121 U/L (ref 31–125)
Bilirubin, Direct: 0.1 mg/dL (ref 0.0–0.2)
Globulin: 3.7 g/dL (ref 1.9–3.7)
Indirect Bilirubin: 0.2 mg/dL (ref 0.2–1.2)
Total Bilirubin: 0.3 mg/dL (ref 0.2–1.2)
Total Protein: 7.2 g/dL (ref 6.1–8.1)

## 2024-05-14 LAB — POCT URINALYSIS DIPSTICK
Bilirubin, UA: NEGATIVE
Glucose, UA: POSITIVE — AB
Ketones, UA: POSITIVE
Nitrite, UA: POSITIVE
Protein, UA: NEGATIVE
Spec Grav, UA: 1.01 (ref 1.010–1.025)
Urobilinogen, UA: NEGATIVE U/dL — AB
pH, UA: 8 (ref 5.0–8.0)

## 2024-05-14 MED ORDER — HYDROXYZINE HCL 10 MG PO TABS: 10.0000 mg | ORAL_TABLET | ORAL | 3 refills | Status: AC | PRN

## 2024-05-14 MED ORDER — HYDROCHLOROTHIAZIDE 25 MG PO TABS: 25.0000 mg | ORAL_TABLET | Freq: Every day | ORAL | 0 refills | Status: DC

## 2024-05-14 MED ORDER — SPIRONOLACTONE 50 MG PO TABS: 50.0000 mg | ORAL_TABLET | Freq: Every day | ORAL | 0 refills | Status: DC

## 2024-05-14 MED ORDER — TORSEMIDE 20 MG PO TABS: 40.0000 mg | ORAL_TABLET | Freq: Every day | ORAL | 0 refills | Status: DC

## 2024-05-14 MED ORDER — VALSARTAN 320 MG PO TABS: 320.0000 mg | ORAL_TABLET | Freq: Every day | ORAL | 0 refills | Status: DC

## 2024-05-15 LAB — MICROALBUMIN / CREATININE URINE RATIO
Creatinine, Urine: 24 mg/dL (ref 20–275)
Microalb Creat Ratio: 2000 mg/g{creat} — ABNORMAL HIGH (ref <30)
Microalb, Ur: 48 mg/dL

## 2024-05-15 LAB — URINE CULTURE
MICRO NUMBER:: 16891688
Result:: NO GROWTH
SPECIMEN QUALITY:: ADEQUATE

## 2024-05-16 ENCOUNTER — Ambulatory Visit: Payer: Self-pay | Admitting: Family

## 2024-05-19 NOTE — Progress Notes (Signed)
 Provider: Roxan Plough FNP-C   Vickie Ponds, Roxan BROCKS, NP  Patient Care Team: Arda Daggs, Roxan BROCKS, NP as PCP - General (Family Medicine) Tobb, Kardie, DO as PCP - Cardiology (Cardiology) Sheldon Standing, MD as Consulting Physician (General Surgery) Alger Gong, MD as Consulting Physician (Obstetrics and Gynecology) Gladis Sin, RN (Inactive) as [facility] Care Management  Extended Emergency Contact Information Primary Emergency Contact: Sprinkle,Sonya  [place] Mobile Phone: [phone] Relation: Mother Secondary Emergency Contact: rhodes,shalaka Mobile Phone: [phone] Relation: Sister  Code Status:  Full Code  Goals of care: Advanced Directive information    05/14/2024   11:03 AM  Advanced Directives  Does Patient Have a Medical Advance Directive? No  Would patient like information on creating a medical advance directive? No - Patient declined     Chief Complaint  Patient presents with   Medical Management of Chronic Issues    6 Month follow up    Discussed the use of AI scribe software for clinical note transcription with the patient, who gave verbal consent to proceed.  History of Present Illness   Carolyn Zimmerman is a 38 year old female with diabetes and hyperlipidemia who presents for a six-month follow-up.  She has a history of diabetes and hyperlipidemia. Recent lab work showed an A1c greater than 15. Her total cholesterol was 173, triglycerides were significantly elevated at 631, HDL was low at less than 32, and LDL was 69. She is adjusting her insulin  regimen to manage blood sugar levels. She stopped taking metformin  due to uncontrollable bowels, but symptoms have not improved.  She experiences body pain, particularly in her legs and shoulders, affecting her ability to exercise regularly. She attempts to walk with her mother when not in pain. Her diet includes red potatoes, rice, vegetables, and fruits, and she is working on reducing carbohydrate  intake to manage triglycerides and blood sugar levels.  Current medications include Aleve , aspirin  81 mg, amlodipine  10 mg, albuterol  as needed, carvedilol  25 mg twice daily, hydrochlorothiazide  25 mg daily, rosuvastatin  20 mg daily, spironolactone , desonide 20 mg, valsartan  320 mg daily, Humalog  Quikpen 22 units three times daily, Lantus  38 units at bedtime, hydroxyzine  10 mg as needed, and Vercera for triglycerides.  She suspects a urinary tract infection due to symptoms of discharge and a rash-like feeling, which improves with A and D ointment. She experiences frequent urination, attributing it to medication and high blood sugar levels. She has not had a Pap smear in a long time and is considering scheduling one.  She experiences numbness and tingling in her right leg, described as 'walking on cotton.' The pain is more noticeable at night when sitting down. She sees a neurologist monthly for these symptoms.  No recent use of albuterol  except during an anxiety attack. No recent episodes of depression, though some days are better than others. Numbness and tingling in right leg, particularly at night.    Past Medical History:  Diagnosis Date   Abscess of chest wall 06/15/2017   Coronavirus infection 05/02/2020   CVA (cerebral vascular accident) (HCC) 07/14/2022   L MCA CVA, watershed infarct   Diabetes mellitus    Heart failure with mildly reduced ejection fraction (HFmrEF) (HCC) 09/29/2021   EF 45%, moderate LVH   Hyperlipidemia associated with type 2 diabetes mellitus (HCC)    Hypertension    Jaundice    Obesity    TOA (tubo-ovarian abscess) 09/2019   Past Surgical History:  Procedure Laterality Date   CHOLECYSTECTOMY  IRRIGATION AND DEBRIDEMENT ABSCESS N/A 05/02/2020   Procedure: IRRIGATION AND DEBRIDEMENT ABDOMINAL and CHEST WALL NECROTIZING FASCITITS;  Surgeon: Sheldon Standing, MD;  Location: [facility];  Service: General;  Laterality: N/A;   liver stent      Allergies  Allergen  Reactions   Prochlorperazine  Anxiety    Panic attacks   Contrast Media [Iodinated Contrast Media] Hives and Swelling    Allergies as of 05/14/2024       Reactions   Prochlorperazine  Anxiety   Panic attacks   Contrast Media [iodinated Contrast Media] Hives, Swelling        Medication List        Accurate as of May 14, 2024 11:59 PM. If you have any questions, ask your nurse or doctor.          Aleve  220 MG tablet Generic drug: naproxen  sodium Take 1,100 mg by mouth daily as needed (for headaches or mild pain).   amLODipine  10 MG tablet Commonly known as: NORVASC  Take 1 tablet (10 mg total) by mouth daily.   Aspirin  Low Dose 81 MG chewable tablet Generic drug: aspirin  Chew 1 tablet (81 mg total) by mouth daily.   carvedilol  25 MG tablet Commonly known as: COREG  Take 1 tablet (25 mg total) by mouth 2 (two) times daily.   FreeStyle Libre 3 Plus Sensor Misc Replace every 15 days   FreeStyle [place] 3 Reader [place] Use as directed for continuous glucose monitoring.   hydrochlorothiazide  25 MG tablet Commonly known as: HYDRODIURIL  Take 1 tablet (25 mg total) by mouth daily.   hydrOXYzine  10 MG tablet Commonly known as: ATARAX  Take 1 tablet (10 mg total) by mouth as needed for anxiety.   Insulin  Glargine Solostar 100 UNIT/ML Solostar Pen Commonly known as: LANTUS  Inject 38 Units into the skin daily.   insulin  lispro 100 UNIT/ML KwikPen Commonly known as: HUMALOG  Inject 22 Units into the skin 3 (three) times daily.   metFORMIN  500 MG 24 hr tablet Commonly known as: GLUCOPHAGE -XR Take 2 tablets (1,000 mg total) by mouth in the morning and at bedtime.   rosuvastatin  20 MG tablet Commonly known as: CRESTOR  Take 1 tablet (20 mg total) by mouth daily.   spironolactone  50 MG tablet Commonly known as: ALDACTONE  Take 1 tablet (50 mg total) by mouth daily.   torsemide  20 MG tablet Commonly known as: DEMADEX  Take 2 tablets (40 mg total) by mouth daily.    valsartan  320 MG tablet Commonly known as: DIOVAN  Take 1 tablet (320 mg total) by mouth daily.   Vascepa 1 g capsule Generic drug: icosapent Ethyl Take by mouth 2 (two) times daily.   Ventolin  HFA 108 (90 Base) MCG/ACT inhaler Generic drug: albuterol  Inhale 2 puffs into the lungs every 4 (four) hours as needed for wheezing or shortness of breath.        Review of Systems  Constitutional:  Negative for appetite change, chills, fatigue, fever and unexpected weight change.  HENT:  Negative for congestion, dental problem, ear discharge, ear pain, facial swelling, hearing loss, nosebleeds, postnasal drip, rhinorrhea, sinus pressure, sinus pain, sneezing, sore throat, tinnitus and trouble swallowing.   Eyes:  Negative for pain, discharge, redness, itching and visual disturbance.  Respiratory:  Negative for cough, chest tightness, shortness of breath and wheezing.   Cardiovascular:  Negative for chest pain, palpitations and leg swelling.  Gastrointestinal:  Negative for abdominal distention, abdominal pain, blood in stool, constipation, diarrhea, nausea and vomiting.  Endocrine: Negative for cold intolerance, heat intolerance, polydipsia,  polyphagia and polyuria.  Genitourinary:  Negative for difficulty urinating, dysuria, flank pain, frequency and urgency.  Musculoskeletal:  Positive for arthralgias. Negative for back pain, gait problem, joint swelling, myalgias, neck pain and neck stiffness.       Generalized body aches  Skin:  Negative for color change, pallor, rash and wound.  Neurological:  Negative for dizziness, syncope, speech difficulty, weakness, light-headedness, numbness and headaches.  Hematological:  Does not bruise/bleed easily.  Psychiatric/Behavioral:  Negative for agitation, behavioral problems, confusion, hallucinations, self-injury, sleep disturbance and suicidal ideas. The patient is nervous/anxious.     Immunization History  Administered Date(s) Administered    Influenza, Seasonal, Injecte, Preservative Fre 10/13/2023   Influenza,inj,Quad PF,6+ Mos 06/16/2017   PNEUMOCOCCAL CONJUGATE-20 10/13/2023   Pneumococcal Polysaccharide-23 06/17/2017   Pertinent  Health Maintenance Due  Topic Date Due   HEMOGLOBIN A1C  04/27/2024   INFLUENZA VACCINE  12/16/2024 (Originally 04/18/2024)   FOOT EXAM  10/28/2024   OPHTHALMOLOGY EXAM  11/18/2024      10/29/2023   10:54 AM 11/02/2023    2:28 PM 11/15/2023    9:04 AM 12/05/2023    2:24 PM 05/14/2024   11:02 AM  Fall Risk  Falls in the past year? 0 0  0 0  Was there an injury with Fall? 0 0 0 0 0  Fall Risk Category Calculator 0 0  0 0  Patient at Risk for Falls Due to No Fall Risks  No Fall Risks No Fall Risks No Fall Risks  Fall risk Follow up Falls evaluation completed;Education provided;Falls prevention discussed  Falls evaluation completed Falls evaluation completed Falls evaluation completed   Functional Status Survey:    Vitals:   05/14/24 1113  BP: (!) 142/96  Pulse: 84  Resp: 20  Temp: 97.8 F (36.6 C)  SpO2: 99%  Weight: 291 lb 3.2 oz (132.1 kg)  Height: 5' 7 (1.702 m)   Body mass index is 45.61 kg/m. Physical Exam GENERAL: Alert, cooperative, well developed, no acute distress HEENT: Normocephalic, normal oropharynx, moist mucous membranes, ears normal, nose normal, no sinus tenderness NECK: Thyroid normal CHEST: Clear to auscultation bilaterally, no wheezes, rhonchi, or crackles CARDIOVASCULAR: Normal heart rate and rhythm, S1 and S2 normal without murmurs ABDOMEN: Soft, non-tender, non-distended, without organomegaly, normal bowel sounds EXTREMITIES: No cyanosis or edema, extremities normal NEUROLOGICAL: Cranial nerves grossly intact, moves all extremities without gross motor deficit, sensation intact but with numbness in feet  SKIN: No rash,no lesion or erythema   PSYCHIATRY/BEHAVIORAL: Mood stable    Labs reviewed: Recent Labs    10/12/23 1338 10/13/23 0652 10/14/23 0554  10/15/23 0512 11/01/23 0631 11/15/23 0958 01/06/24 0341 01/06/24 0906 01/07/24 0533 02/01/24 2319 02/01/24 2328  NA 135   < > 135   < > 132*   < > 134*  --  131* 127* 127*  K 3.4*   < > 3.3*   < > 4.1   < > 3.7  --  3.8 4.1 4.2  CL 102   < > 103   < > 99   < > 100  --  98 90*  --   CO2 23   < > 25   < > 24   < > 27  --  26 24  --   GLUCOSE 124*   < > 162*   < > 318*   < > 260*  --  289* 615*  --   BUN 11   < > 14   < >  22*   < > 14  --  17 18  --   CREATININE 1.01*   < > 0.73   < > 0.93   < > 1.01*  --  0.94 1.18*  --   CALCIUM  7.7*   < > 7.5*   < > 9.0   < > 9.2  --  8.5* 9.2  --   MG 1.6*   < > 1.9  --  1.7  --   --  1.9  --   --   --   PHOS 2.0*  --  2.5  --   --   --   --   --   --   --   --    < > = values in this interval not displayed.   Recent Labs    11/01/23 0631 11/15/23 0958 01/07/24 0533 02/01/24 2319 05/14/24 1201  AST 20   < > 19 17 14   ALT 18   < > 22 14 14   ALKPHOS 85  --  74 119  --   BILITOT 0.4   < > 0.3 <0.2 0.3  PROT 6.9   < > 6.5 7.2 7.2  ALBUMIN 2.7*  --  2.5* 3.4*  --    < > = values in this interval not displayed.   Recent Labs    01/05/24 2026 01/07/24 0533 02/01/24 2231 02/01/24 2328 05/14/24 1201  WBC 8.4 6.0 9.9  --  8.9  NEUTROABS 4.5  --  4.8  --  5,429  HGB 10.8* 10.9* 12.0 11.9* 11.8  HCT 32.4* 34.1* 35.6* 35.0* 38.6  MCV 81.0 83.6 81.7  --  85.0  PLT 286 237 289  --  411*   Lab Results  Component Value Date   TSH 1.13 10/29/2023   Lab Results  Component Value Date   HGBA1C 11.3 (H) 10/29/2023   Lab Results  Component Value Date   CHOL 178 10/29/2023   HDL 40 (L) 10/29/2023   LDLCALC  10/29/2023     Comment:     . LDL cholesterol not calculated. Triglyceride levels greater than 400 mg/dL invalidate calculated LDL results. . Reference range: <100 . Desirable range <100 mg/dL for primary prevention;   <70 mg/dL for patients with CHD or diabetic patients  with > or = 2 CHD risk factors. [facility] LDL-C is now calculated  using the Martin-Hopkins  calculation, which is a validated novel method providing  better accuracy than the Friedewald equation in the  estimation of LDL-C.  Gladis APPLETHWAITE et al. [facility]ANDREA. 7986;689(80): 2061-2068  (http://education.[employer].com/faq/FAQ164)    LDLDIRECT 64.0 03/26/2023   TRIG 417 (H) 10/29/2023   CHOLHDL 4.5 10/29/2023    Significant Diagnostic Results in last 30 days:  No results found.  Assessment/Plan Assessment and Plan    Type 2 diabetes mellitus with hyperglycemia and diabetic neuropathy A1c is significantly elevated at over 15%, indicating poor glycemic control. Neuropathy symptoms include numbness and pain in the right leg, described as feeling like walking on cotton. High blood sugars are contributing to neuropathy and other complications. - Continue current insulin  regimen: Humalog  Quikpen 22 units three times daily and Lantus  38 units at bedtime. - Monitor blood sugars with Freestyle Libre, aiming for levels less than 200. - Encourage dietary modifications to reduce carbohydrate intake, particularly white rice and potatoes, and increase vegetable intake. - Encourage exercise, such as swimming or water aerobics, to improve glycemic control and neuropathy symptoms. - Consider Ozempic  once triglycerides are controlled.  Hypertriglyceridemia Triglycerides are significantly elevated at 631 mg/dL, with a target of less than 150 mg/dL. High triglycerides are contributing to poor glycemic control. - Start Vercera (Isoptin) 1 gram capsule twice daily for triglyceride management. - Encourage dietary modifications to reduce carbohydrate intake, particularly white rice and potatoes, and increase vegetable intake.  Congestive heart failure Managed with carvedilol , spironolactone , and valsartan . - Continue current medications: carvedilol , spironolactone , and valsartan . - Follow up with cardiologist on September 12th.  Hypertension Blood pressure management includes  amlodipine , hydrochlorothiazide , and valsartan . Current regimen appears to be ineffective. - Continue current antihypertensive medications: amlodipine , hydrochlorothiazide , and valsartan .  Asthma Well-controlled with infrequent use of albuterol , primarily used during anxiety attacks. - Continue albuterol  as needed for asthma symptoms.  Chronic pain in multiple sites Chronic pain reported in legs and shoulders, exacerbated by activity. Pain management includes Aleve  and lifestyle modifications. - Continue Aleve  for pain management. - Encourage water-based exercises, such as swimming or water aerobics, to alleviate pain.  Possible urinary tract infection Symptoms include discharge and possible rash, with differential diagnosis including yeast infection. Frequent urination may be due to high blood sugars and diuretic use. - Obtain urine specimen for culture and microalbumin test to check for UTI and proteinuria.  Depression and anxiety  Intermittent depressive symptoms reported, with some days better than others.  General Health Maintenance Due for Tdap, COVID, and flu vaccinations. Pap smear is overdue. - Administer Tdap vaccine. - Recommend COVID and flu vaccines, which can be obtained at the pharmacy. - Schedule Pap smear, either with a gynecologist or at this clinic.   Family/ staff Communication: Reviewed plan of care with patient verbalized understanding   Labs/tests ordered:  - CBC/diff  - Hepatic panel  - Urine Culture; Future - POC Urinalysis Dipstick - urine microalbumin    Next Appointment : Return in about 6 months (around 11/14/2024) for medical mangement of chronic issues.[facility]   Spent 30 minutes of Face to face and non-face to face with patient  >50% time spent counseling; reviewing medical record; tests; labs; documentation and developing future plan of care.   Roxan JAYSON Plough, NP

## 2024-05-23 ENCOUNTER — Emergency Department (HOSPITAL_BASED_OUTPATIENT_CLINIC_OR_DEPARTMENT_OTHER)

## 2024-05-23 ENCOUNTER — Emergency Department (HOSPITAL_BASED_OUTPATIENT_CLINIC_OR_DEPARTMENT_OTHER)
Admission: EM | Admit: 2024-05-23 | Discharge: 2024-05-23 | Disposition: A | Discharge status: Home / Self Care | Attending: Emergency Medicine | Admitting: Emergency Medicine

## 2024-05-23 ENCOUNTER — Other Ambulatory Visit: Payer: Self-pay

## 2024-05-23 DIAGNOSIS — Z87891 Personal history of nicotine dependence: Secondary | ICD-10-CM | POA: Diagnosis not present

## 2024-05-23 DIAGNOSIS — Z8616 Personal history of COVID-19: Secondary | ICD-10-CM | POA: Diagnosis not present

## 2024-05-23 DIAGNOSIS — E1165 Type 2 diabetes mellitus with hyperglycemia: Secondary | ICD-10-CM | POA: Insufficient documentation

## 2024-05-23 DIAGNOSIS — Z8673 Personal history of transient ischemic attack (TIA), and cerebral infarction without residual deficits: Secondary | ICD-10-CM | POA: Diagnosis not present

## 2024-05-23 DIAGNOSIS — Z7982 Long term (current) use of aspirin: Secondary | ICD-10-CM | POA: Diagnosis not present

## 2024-05-23 DIAGNOSIS — I110 Hypertensive heart disease with heart failure: Secondary | ICD-10-CM | POA: Diagnosis not present

## 2024-05-23 DIAGNOSIS — Z794 Long term (current) use of insulin: Secondary | ICD-10-CM | POA: Diagnosis not present

## 2024-05-23 DIAGNOSIS — R197 Diarrhea, unspecified: Secondary | ICD-10-CM | POA: Diagnosis present

## 2024-05-23 DIAGNOSIS — I5089 Other heart failure: Secondary | ICD-10-CM | POA: Insufficient documentation

## 2024-05-23 DIAGNOSIS — Z79899 Other long term (current) drug therapy: Secondary | ICD-10-CM | POA: Insufficient documentation

## 2024-05-23 LAB — COMPREHENSIVE METABOLIC PANEL WITH GFR
ALT: 9 U/L (ref 0–44)
AST: 13 U/L — ABNORMAL LOW (ref 15–41)
Albumin: 3 g/dL — ABNORMAL LOW (ref 3.5–5.0)
Alkaline Phosphatase: 111 U/L (ref 38–126)
Anion gap: 11 (ref 5–15)
BUN: 11 mg/dL (ref 6–20)
CO2: 20 mmol/L — ABNORMAL LOW (ref 22–32)
Calcium: 8.7 mg/dL — ABNORMAL LOW (ref 8.9–10.3)
Chloride: 101 mmol/L (ref 98–111)
Creatinine, Ser: 0.85 mg/dL (ref 0.44–1.00)
GFR, Estimated: >60 mL/min (ref >60)
Glucose, Bld: 358 mg/dL — ABNORMAL HIGH (ref 70–99)
Potassium: 4 mmol/L (ref 3.5–5.1)
Sodium: 131 mmol/L — ABNORMAL LOW (ref 135–145)
Total Bilirubin: 0.3 mg/dL (ref 0.0–1.2)
Total Protein: 6.8 g/dL (ref 6.5–8.1)

## 2024-05-23 LAB — CBC WITH DIFFERENTIAL/PLATELET
Abs Immature Granulocytes: 0.03 K/uL (ref 0.00–0.07)
Basophils Absolute: 0 K/uL (ref 0.0–0.1)
Basophils Relative: 0 %
Eosinophils Absolute: 0.1 K/uL (ref 0.0–0.5)
Eosinophils Relative: 1 %
HCT: 29.5 % — ABNORMAL LOW (ref 36.0–46.0)
Hemoglobin: 9.5 g/dL — ABNORMAL LOW (ref 12.0–15.0)
Immature Granulocytes: 0 %
Lymphocytes Relative: 31 %
Lymphs Abs: 2.4 K/uL (ref 0.7–4.0)
MCH: 25.7 pg — ABNORMAL LOW (ref 26.0–34.0)
MCHC: 32.2 g/dL (ref 30.0–36.0)
MCV: 79.7 fL — ABNORMAL LOW (ref 80.0–100.0)
Monocytes Absolute: 0.6 K/uL (ref 0.1–1.0)
Monocytes Relative: 8 %
Neutro Abs: 4.6 K/uL (ref 1.7–7.7)
Neutrophils Relative %: 60 %
Platelets: 262 K/uL (ref 150–400)
RBC: 3.7 MIL/uL — ABNORMAL LOW (ref 3.87–5.11)
RDW: 13.8 % (ref 11.5–15.5)
WBC: 7.8 K/uL (ref 4.0–10.5)
nRBC: 0 % (ref 0.0–0.2)

## 2024-05-23 LAB — PREGNANCY, URINE: Preg Test, Ur: NEGATIVE

## 2024-05-23 LAB — CBG MONITORING, ED: Glucose-Capillary: 375 mg/dL — ABNORMAL HIGH (ref 70–99)

## 2024-05-23 MED ORDER — LOPERAMIDE HCL 2 MG PO CAPS
4.0000 mg | ORAL_CAPSULE | Freq: Once | ORAL | Status: AC
  Administered 2024-05-23: 4 mg via ORAL
  Filled 2024-05-23: qty 2

## 2024-05-23 MED ORDER — NYSTATIN 100000 UNIT/GM EX CREA: TOPICAL_CREAM | CUTANEOUS | 0 refills | Status: DC

## 2024-05-23 MED ORDER — LOPERAMIDE HCL 2 MG PO CAPS: 2.0000 mg | ORAL_CAPSULE | Freq: Four times a day (QID) | ORAL | 0 refills | Status: AC | PRN

## 2024-05-23 NOTE — ED Provider Notes (Signed)
 [place] EMERGENC[facility] DE[facility]MENT AT [facility] Provider Note  CSN: [id number] Arrival date & time: 05/23/24 9182  Chief Complaint(s) Diarrhea  HPI Carolyn Zimmerman is a 38 y.o. female with a history of uncontrolled diabetes, uncontrolled hypertension, here today with 2 months of diarrhea.  She is no longer on metformin , states that she took antibiotics 2 months ago for UTI.   Past Medical History Past Medical History:  Diagnosis Date   Abscess of chest wall 06/15/2017   Coronavirus infection 05/02/2020   CVA (cerebral vascular accident) (HCC) 07/14/2022   L MCA CVA, watershed infarct   Diabetes mellitus    Heart failure with mildly reduced ejection fraction (HFmrEF) (HCC) 09/29/2021   EF 45%, moderate LVH   Hyperlipidemia associated with type 2 diabetes mellitus (HCC)    Hypertension    Jaundice    Obesity    TOA (tubo-ovarian abscess) 09/2019   Patient Active Problem List   Diagnosis Date Noted   Hyperglycemia due to type 2 diabetes mellitus (HCC) 01/06/2024   Prolonged QT interval 01/06/2024   Cellulitis 10/31/2023   Cellulitis of right leg 10/30/2023   Class 3 obesity 10/30/2023   Heart failure with mildly reduced ejection fraction (HCC) 10/29/2023   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 (HCC) 10/29/2023   SIRS (systemic inflammatory response syndrome) (HCC) 10/21/2023   Leukocytosis 10/13/2023   Cellulitis of right lower extremity 10/12/2023   Tobacco abuse 10/12/2023   Hypomagnesemia 10/12/2023   Hypophosphatemia 10/12/2023   Hypokalemia 10/12/2023   Protein-calorie malnutrition, severe (HCC) 10/12/2023   Hypertension 10/12/2023   Left carotid artery stenosis 05/31/2023   Left upper arm pain 05/17/2023   History of CVA (cerebrovascular accident) 12/19/2022   Cerebrovascular accident (CVA) (HCC) 11/02/2022   Healthcare maintenance 04/22/2022   Needs smoking cessation education 04/22/2022   Anxiety 04/22/2022   Depression 04/22/2022    Encounter to establish care with new doctor 04/22/2022   Morbid obesity (HCC) 01/17/2022   Insulin -requiring or dependent type II diabetes mellitus (HCC) 01/17/2022   Mixed hyperlipidemia 01/17/2022   Medication management 12/29/2021   Screening for hyperlipidemia 12/29/2021   Hypertensive heart disease without heart failure 12/29/2021   Accelerated hypertension 12/08/2021   Daytime somnolence 12/08/2021   Fatigue 12/08/2021   Depressed left ventricular ejection fraction 12/08/2021   Panniuclitis with abdominal wall abscess s/p panniculectomy 05/02/2020 05/02/2020   Obesity, Class III, BMI 40-49.9 (morbid obesity) 05/02/2020   COVID-19 virus infection 05/02/2020   Panniculitis abdominal wall 05/02/2020   Necrotizing fasciitis (HCC) 05/02/2020   Hidradenitis suppurativa of axillae,chest wall, groins 05/02/2020   Type 2 diabetes mellitus with hyperglycemia (HCC) 10/18/2019   TOA (tubo-ovarian abscess) 10/17/2019   Elevated LFTs 09/16/2018   Uncontrolled diabetes mellitus with hyperglycemia (HCC) 09/16/2018   Uncontrolled hypertension 09/16/2018   Increased frequency of urination 09/16/2018   Bile duct stricture 07/30/2018   Chest wall abscesses x 2 s/p I&D 05/02/2020 06/15/2017   Hyperglycemia 06/15/2017   Hx of medication noncompliance    Home Medication(s) Prior to Admission medications   Medication Sig Start Date End Date Taking? Authorizing Provider  loperamide  (IMODIUM ) 2 MG capsule Take 1 capsule (2 mg total) by mouth 4 (four) times daily as needed for diarrhea or loose stools. 05/23/24  Yes Mannie Pac T, DO  nystatin  cream (MYCOSTATIN ) Apply to affected area 2 times daily 05/23/24  Yes Mannie Pac T, DO  albuterol  (VENTOLIN  HFA) 108 (90 Base) MCG/ACT inhaler Inhale 2 puffs into the lungs every 4 (  four) hours as needed for wheezing or shortness of breath. 11/02/23   Gherghe, Costin M, MD  ALEVE  220 MG tablet Take 1,100 mg by mouth daily as needed (for headaches or mild pain).     [provider]  amLODipine  (NORVASC ) 10 MG tablet Take 1 tablet (10 mg total) by mouth daily. 11/02/23   Gherghe, Costin M, MD  aspirin  81 MG chewable tablet Chew 1 tablet (81 mg total) by mouth daily. 10/29/23   Ngetich, [name], NP  carvedilol  (COREG ) 25 MG tablet Take 1 tablet (25 mg total) by mouth 2 (two) times daily. 11/02/23   Gherghe, Costin M, MD  Continuous Glucose Receiver (FREESTYLE LIBRE 3 READER) DEVI Use as directed for continuous glucose monitoring. 04/15/24   [provider]  Continuous Glucose Sensor (FREESTYLE LIBRE 3 PLUS SENSOR) MISC Replace every 15 days 04/15/24   [provider]  hydrochlorothiazide  (HYDRODIURIL ) 25 MG tablet Take 1 tablet (25 mg total) by mouth daily. 05/14/24 08/12/24  Ngetich, [name], NP  hydrOXYzine  (ATARAX ) 10 MG tablet Take 1 tablet (10 mg total) by mouth as needed for anxiety. 05/14/24   Ngetich, [name], NP  icosapent Ethyl (VASCEPA) 1 g capsule Take by mouth 2 (two) times daily.    [provider]  Insulin  Glargine Solostar (LANTUS ) 100 UNIT/ML Solostar Pen Inject 38 Units into the skin daily. 01/08/24   Christobal Guadalajara, MD  insulin  lispro (HUMALOG ) 100 UNIT/ML KwikPen Inject 22 Units into the skin 3 (three) times daily. 01/07/24   Ngetich, [name], NP  metFORMIN  (GLUCOPHAGE -XR) 500 MG 24 hr tablet Take 2 tablets (1,000 mg total) by mouth in the morning and at bedtime. Patient not taking: Reported on 05/14/2024 11/02/23   Gherghe, Costin M, MD  rosuvastatin  (CRESTOR ) 20 MG tablet Take 1 tablet (20 mg total) by mouth daily. 11/02/23   Gherghe, Costin M, MD  spironolactone  (ALDACTONE ) 50 MG tablet Take 1 tablet (50 mg total) by mouth daily. 05/14/24   Ngetich, [name], NP  torsemide  (DEMADEX ) 20 MG tablet Take 2 tablets (40 mg total) by mouth daily. 05/14/24 08/12/24  Ngetich, [name], NP  valsartan  (DIOVAN ) 320 MG tablet Take 1 tablet (320 mg total) by mouth daily. 05/14/24   Ngetich, Roxan BROCKS, NP                                                                                                                                     Past Surgical History Past Surgical History:  Procedure Laterality Date   CHOLECYSTECTOMY     IRRIGATION AND DEBRIDEMENT ABSCESS N/A 05/02/2020   Procedure: IRRIGATION AND DEBRIDEMENT ABDOMINAL and CHEST WALL NECROTIZING FASCITITS;  Surgeon: Sheldon Standing, MD;  Location: [facility];  Service: General;  Laterality: N/A;   liver stent     Family History Family History  Problem Relation Age of Onset   Hypertension Mother    Diabetes Father  Hypertension Father     Social History Social History   Tobacco Use   Smoking status: Former    Current packs/day: 0.00    Types: Cigarettes    Quit date: 10/08/2023    Years since quitting: 0.6   Smokeless tobacco: Never  Vaping Use   Vaping status: Former  Substance Use Topics   Alcohol use: Not Currently    Comment: occ   Drug use: No   Allergies Prochlorperazine  and Contrast media [iodinated contrast media]  Review of Systems Review of Systems  Physical Exam Vital Signs  I have reviewed the triage vital signs BP (!) 184/110   Pulse 88   Temp 98.1 F (36.7 C) (Oral)   Resp 16   LMP 04/30/2024   SpO2 100%   Physical Exam Vitals and nursing note reviewed.  Cardiovascular:     Rate and Rhythm: Normal rate.  Pulmonary:     Effort: Pulmonary effort is normal.  Abdominal:     General: Abdomen is flat. There is no distension.     Palpations: Abdomen is soft.     Tenderness: There is no abdominal tenderness. There is no guarding.  Musculoskeletal:        General: Normal range of motion.  Neurological:     Mental Status: She is alert.     ED Results and Treatments Labs (all labs ordered are listed, but only abnormal results are displayed) Labs Reviewed  CBC WITH DIFFERENTIAL/PLATELET - Abnormal; Notable for the following components:      Result Value   RBC 3.70 (*)    Hemoglobin 9.5 (*)    HCT 29.5 (*)    MCV 79.7 (*)     MCH 25.7 (*)    All other components within normal limits  COMPREHENSIVE METABOLIC PANEL WITH GFR - Abnormal; Notable for the following components:   Sodium 131 (*)    CO2 20 (*)    Glucose, Bld 358 (*)    Calcium  8.7 (*)    Albumin 3.0 (*)    AST 13 (*)    All other components within normal limits  CBG MONITORING, ED - Abnormal; Notable for the following components:   Glucose-Capillary 375 (*)    All other components within normal limits  PREGNANCY, URINE                                                                                                                          Radiology CT ABDOMEN PELVIS WO CONTRAST Result Date: 05/23/2024 CLINICAL DATA:  Intermittent generalized abdominal pain and diarrhea for the past 2 months. EXAM: CT ABDOMEN AND PELVIS WITHOUT CONTRAST TECHNIQUE: Multidetector CT imaging of the abdomen and pelvis was performed following the standard protocol without IV contrast. RADIATION DOSE REDUCTION: This exam was performed according to the departmental dose-optimization program which includes automated exposure control, adjustment of the mA and/or kV according to patient size and/or use of iterative reconstruction technique. COMPARISON:  Chest, abdomen and pelvis CT  dated 01/08/2024. FINDINGS: Lower chest: Mildly enlarged heart. Minimal pericardial effusion measuring 5 mm in thickness. Clear lung bases. Hepatobiliary: Stable hepatomegaly. Status post cholecystectomy. No biliary dilatation. Pancreas: Unremarkable. No pancreatic ductal dilatation or surrounding inflammatory changes. Spleen: Normal in size with an interval decrease in size. No focal abnormality. Adrenals/Urinary Tract: Adrenal glands are unremarkable. Kidneys are normal, without renal calculi, focal lesion, or hydronephrosis. Bladder is unremarkable. Stomach/Bowel: Stomach is within normal limits. Appendix appears normal. No evidence of bowel wall thickening, distention, or inflammatory changes.  Vascular/Lymphatic: Mildly enlarged bilateral common femoral lymph nodes, measuring 13 mm in short axis diameter on the left and 10 mm in short axis diameter on the right on image number 83/2. Mildly enlarged left obturator lymph node measuring 10 mm in short axis diameter on image number 79/2. Bilateral enlarged inguinal lymph nodes. The largest node on the left measures 20 mm in short axis diameter in the largest node on the right measures 20 mm in short axis diameter on image number 92/2. No other enlarged lymph nodes. Minimal arterial calcifications without aneurysm. Reproductive: Interval 5 cm simple appearing left ovarian cyst measuring 15 Hounsfield units in density on image number 71/2. This does not need imaging follow-up. Normal-appearing uterus and right ovary. Other: Small amount of free peritoneal fluid in the pelvic cul-de-sac, within normal limits of physiological fluid. Mild bilateral subcutaneous edema. Musculoskeletal: Lower thoracic spine degenerative changes and minimal lumbar spine degenerative changes. IMPRESSION: 1. Stable hepatomegaly with no splenomegaly today. 2. Bilateral common femoral, inguinal and left obturator adenopathy. This is nonspecific and could be reactive or neoplastic. 3. Mild cardiomegaly and minimal pericardial effusion. 4. Mild bilateral subcutaneous edema. Electronically Signed   By: Elspeth Bathe M.D.   On: 05/23/2024 11:07    Pertinent labs & imaging results that were available during my care of the patient were reviewed by me and considered in my medical decision making (see MDM for details).  Medications Ordered in ED Medications  loperamide  (IMODIUM ) capsule 4 mg (4 mg Oral Given 05/23/24 0943)                                                                                                                                     Procedures Procedures  (including critical care time)  Medical Decision Making / ED Course   This patient presents to the ED for  concern of 2 months of diarrhea, this involves an extensive number of treatment options, and is a complaint that carries with it a high risk of complications and morbidity.  The differential diagnosis includes chronic diarrhea, poorly controlled diabetes, less likely diverticulitis, less likely cystitis, less likely acute infection  MDM: Patient with a soft abdomen, given her duration of symptoms, I have lower suspicion for an acute emergent cause.  Will obtain imaging, labs on the patient.  No urinary symptoms.  She is also reporting some irritation in her groin, likely candidiasis.  Will plan to provide her with some nystatin  cream.  Had a lengthy discussion with the patient regarding her diabetes.  At her last visit, patient had an A1c greater than 15.  Discussed with the patient and her mother all the various dietary modifications that need to be made in addition to regular use of her insulin .  Reassessment 12:10 PM-patient CT imaging negative aside from some enlarged lymph nodes in her groin.  I believe these are likely reactive.  I will have the patient follow-up with her PCP regarding these lymph nodes.  Provided literature on foods for glycemic control.  Reviewed the patient's labs, she does have a decrease in her hemoglobin.  Denies any dark or melanotic stools, no evidence of anemia or brisk GI bleed here in the ED.  Will the patient follow-up with her PCP for repeat testing.  Evaluated the patient's rash, appears to just be some contact dermatitis.  Will give her some nystatin  cream in case there is some candidiasis as well.  Additional history obtained: -Additional history obtained from mother at bedside -External records from outside source obtained and reviewed including: Chart review including previous notes, labs, imaging, consultation notes   Lab Tests: -I ordered, reviewed, and interpreted labs.   The pertinent results include:   Labs Reviewed  CBC WITH DIFFERENTIAL/PLATELET  - Abnormal; Notable for the following components:      Result Value   RBC 3.70 (*)    Hemoglobin 9.5 (*)    HCT 29.5 (*)    MCV 79.7 (*)    MCH 25.7 (*)    All other components within normal limits  COMPREHENSIVE METABOLIC PANEL WITH GFR - Abnormal; Notable for the following components:   Sodium 131 (*)    CO2 20 (*)    Glucose, Bld 358 (*)    Calcium  8.7 (*)    Albumin 3.0 (*)    AST 13 (*)    All other components within normal limits  CBG MONITORING, ED - Abnormal; Notable for the following components:   Glucose-Capillary 375 (*)    All other components within normal limits  PREGNANCY, URINE       Imaging Studies ordered: I ordered imaging studies including CT abdomen pelvis I independently visualized and interpreted imaging. I agree with the radiologist interpretation   Medicines ordered and prescription drug management: Meds ordered this encounter  Medications   loperamide  (IMODIUM ) capsule 4 mg   loperamide  (IMODIUM ) 2 MG capsule    Sig: Take 1 capsule (2 mg total) by mouth 4 (four) times daily as needed for diarrhea or loose stools.    Dispense:  12 capsule    Refill:  0   nystatin  cream (MYCOSTATIN )    Sig: Apply to affected area 2 times daily    Dispense:  30 g    Refill:  0    -I have reviewed the patients home medicines and have made adjustments as needed   Cardiac Monitoring: The patient was maintained on a cardiac monitor.  I personally viewed and interpreted the cardiac monitored which showed an underlying rhythm of: Normal sinus rhythm  Social Determinants of Health:  Factors impacting patients care include: Multiple medical comorbidities including poorly controlled diabetes, heart failure   Reevaluation: After the interventions noted above, I reevaluated the patient and found that they have :improved  Co morbidities that complicate the patient evaluation  Past Medical History:  Diagnosis Date   Abscess of chest wall 06/15/2017   Coronavirus  infection 05/02/2020  CVA (cerebral vascular accident) (HCC) 07/14/2022   L MCA CVA, watershed infarct   Diabetes mellitus    Heart failure with mildly reduced ejection fraction (HFmrEF) (HCC) 09/29/2021   EF 45%, moderate LVH   Hyperlipidemia associated with type 2 diabetes mellitus (HCC)    Hypertension    Jaundice    Obesity    TOA (tubo-ovarian abscess) 09/2019      Dispostion: I considered admission for this patient, however she would benefit more from PCP follow-up.     Final Clinical Impression(s) / ED Diagnoses Final diagnoses:  Diarrhea, unspecified type     @PCDICTATION @    Mannie Pac T, DO 05/23/24 1217

## 2024-05-23 NOTE — ED Notes (Signed)
 Pt given discharge instructions and reviewed prescriptions. Opportunities given for questions. Pt verbalizes understanding. PIV removed x1. Bethena Powell SAUNDERS, RN

## 2024-05-23 NOTE — ED Triage Notes (Signed)
 Pt arrives with c/o diarrhea for the last 2 months.

## 2024-05-23 NOTE — Discharge Instructions (Addendum)
 While you are in the emergency room, you had blood work done.  The red blood cell count was a little bit lower than your previous labs.  Please follow-up with your primary care doctor within 2 to 3 weeks for repeat blood work.  Your CT scan showed some mild enlargement of some of the lymph nodes in your groin.  I sent you some Imodium , which can help with diarrhea.  You may take it up to 2 times per day.  I have also sent you some nystatin  cream to help out with the irritated skin on your bottom.

## 2024-05-28 ENCOUNTER — Encounter: Payer: Self-pay | Admitting: *Deleted

## 2024-05-30 ENCOUNTER — Ambulatory Visit: Attending: Cardiology | Admitting: Cardiology

## 2024-05-30 ENCOUNTER — Encounter: Payer: Self-pay | Admitting: Cardiology

## 2024-05-30 VITALS — BP 138/92 | HR 78 | Ht 67.0 in | Wt 286.5 lb

## 2024-05-30 DIAGNOSIS — R931 Abnormal findings on diagnostic imaging of heart and coronary circulation: Secondary | ICD-10-CM | POA: Insufficient documentation

## 2024-05-30 DIAGNOSIS — I10 Essential (primary) hypertension: Secondary | ICD-10-CM | POA: Insufficient documentation

## 2024-05-30 DIAGNOSIS — Z87891 Personal history of nicotine dependence: Secondary | ICD-10-CM | POA: Insufficient documentation

## 2024-05-30 DIAGNOSIS — Z01812 Encounter for preprocedural laboratory examination: Secondary | ICD-10-CM | POA: Diagnosis present

## 2024-05-30 DIAGNOSIS — Z8673 Personal history of transient ischemic attack (TIA), and cerebral infarction without residual deficits: Secondary | ICD-10-CM | POA: Diagnosis present

## 2024-05-30 DIAGNOSIS — E119 Type 2 diabetes mellitus without complications: Secondary | ICD-10-CM | POA: Insufficient documentation

## 2024-05-30 DIAGNOSIS — Z794 Long term (current) use of insulin: Secondary | ICD-10-CM | POA: Diagnosis present

## 2024-05-30 DIAGNOSIS — E66813 Obesity, class 3: Secondary | ICD-10-CM | POA: Diagnosis present

## 2024-05-30 DIAGNOSIS — R072 Precordial pain: Secondary | ICD-10-CM | POA: Diagnosis present

## 2024-05-30 DIAGNOSIS — E782 Mixed hyperlipidemia: Secondary | ICD-10-CM | POA: Diagnosis present

## 2024-05-30 DIAGNOSIS — I5032 Chronic diastolic (congestive) heart failure: Secondary | ICD-10-CM | POA: Diagnosis present

## 2024-05-30 MED ORDER — HYDRALAZINE HCL 25 MG PO TABS: 25.0000 mg | ORAL_TABLET | Freq: Two times a day (BID) | ORAL | 3 refills | Status: DC

## 2024-05-30 MED ORDER — PREDNISONE 50 MG PO TABS
ORAL_TABLET | ORAL | 0 refills | Status: DC
Start: 2024-05-30 — End: 2024-07-22

## 2024-05-30 MED ORDER — METOPROLOL TARTRATE 100 MG PO TABS
ORAL_TABLET | ORAL | 0 refills | Status: DC
Start: 2024-05-30 — End: 2024-07-22

## 2024-05-30 NOTE — Patient Instructions (Addendum)
 Medication Instructions:  Your physician has recommended you make the following change in your medication:  START: Hydralazine  25 mg twice daily  *If you need a refill on your cardiac medications before your next appointment, please call your pharmacy*  Lab Work: CMET, Mag If you have labs (blood work) drawn today and your tests are completely normal, you will receive your results only by: MyChart Message (if you have MyChart) OR A paper copy in the mail If you have any lab test that is abnormal or we need to change your treatment, we will call you to review the results.  Testing/Procedures:  Your cardiac CT will be scheduled at one of the below locations:   [facility] [address] [place], [place] [address] [phone] (Severe contrast allergies only)  OR   [facility] [address]  [place], [place] [address] [phone]   If scheduled at [facility], please arrive at the [facility] and Children's Entrance (Entrance C2) of [facility] 30 minutes prior to test start time. You can use the FREE valet parking offered at entrance C (encouraged to control the heart rate for the test)  Proceed to the [facility] Radiology Department (first floor) to check-in and test prep.  All radiology patients and guests should use entrance C2 at [facility], accessed from [facility], even though the hospital's physical address listed is [address].  If scheduled at the Heart and [facility] Tower at [address], please enter the parking lot using the [address] entrance and use the FREE valet service at the patient drop-off area. Enter the building and check-in with registration on the main floor.  Please follow these instructions carefully (unless otherwise directed):  An IV will be required for this test and Nitroglycerin  will be given.   On the Night Before the Test: Be sure to Drink  plenty of water. Do not consume any caffeinated/decaffeinated beverages or chocolate 12 hours prior to your test. Do not take any antihistamines 12 hours prior to your test.  If the patient has contrast allergy: Patient will need a prescription for Prednisone  and very clear instructions (as follows): Prednisone  50 mg - take 13 hours prior to test Take another Prednisone  50 mg 7 hours prior to test Take another Prednisone  50 mg 1 hour prior to test Take Benadryl  50 mg 1 hour prior to test Patient must complete all four doses of above prophylactic medications. Patient will need a ride after test due to Benadryl .  On the Day of the Test: Drink plenty of water until 1 hour prior to the test. Do not eat any food 1 hour prior to test. You may take your regular medications prior to the test.  Take metoprolol  (Lopressor ) two hours prior to test. If you take Furosemide /Hydrochlorothiazide /Spironolactone /Chlorthalidone, please HOLD on the morning of the test. Patients who wear a continuous glucose monitor MUST remove the device prior to scanning. FEMALES- please wear underwire-free bra if available, avoid dresses & tight clothing      After the Test: Drink plenty of water. After receiving IV contrast, you may experience a mild flushed feeling. This is normal. On occasion, you may experience a mild rash up to 24 hours after the test. This is not dangerous. If this occurs, you can take Benadryl  25 mg, Zyrtec, Claritin, or Allegra and increase your fluid intake. (Patients taking Tikosyn should avoid Benadryl , and may take Zyrtec, Claritin, or Allegra) If  you experience trouble breathing, this can be serious. If it is severe call 911 IMMEDIATELY. If it is mild, please call our office.  We will call to schedule your test 2-4 weeks out understanding that some insurance companies will need an authorization prior to the service being performed.   For more information and frequently asked questions,  please visit our website : [email or web address]  For non-scheduling related questions, please contact the cardiac imaging nurse navigator should you have any questions/concerns: Cardiac Imaging Nurse Navigators Direct Office Dial : [phone]   For scheduling needs, including cancellations and rescheduling, please call [place], [phone].   Follow-Up: At [facility], you and your health needs are our priority.  As part of our continuing mission to provide you with exceptional heart care, our providers are all part of one team.  This team includes your primary Cardiologist (physician) and Advanced Practice Providers or APPs (Physician Assistants and Nurse Practitioners) who all work together to provide you with the care you need, when you need it.  Your next appointment:   16 week(s)  Provider:   Kardie Tobb, DO

## 2024-06-01 NOTE — Progress Notes (Signed)
 Cardiology Office Note:    Date:  06/01/2024   ID:  Carolyn Zimmerman, DOB [birth year], MRN [id number]  PCP:  Leonarda Roxan BROCKS, NP  Cardiologist:  Jenesis Martin, DO  Electrophysiologist:  None   Referring MD: Leonarda Roxan BROCKS, NP    I have had so much go on since I saw you   History of Present Illness:    Carolyn Zimmerman is a 38 y.o. female with a hx of hx of uncontrolled hypertension, CVA in October 2023 acute left cerebral hemisphere compatible with watershed infarct, diabetes mellitus, heart failure with midrange ejection fraction, smoker, obesity, hyperlipidemia.   She experiences mid-chest pain accompanied by dyspnea, which began at home. The symptoms persisted despite rest, leading her to seek emergency care. She has hypertension, with recent improvements in blood pressure control, but continues to struggle with diabetes management. Her current medications include Norvasc , Coreg , hydrochlorothiazide , spironolactone , torsemide , and insulin . She has ceased smoking since January 20th.   Past Medical History:  Diagnosis Date   Abscess of chest wall 06/15/2017   Coronavirus infection 05/02/2020   CVA (cerebral vascular accident) (HCC) 07/14/2022   L MCA CVA, watershed infarct   Diabetes mellitus    Heart failure with mildly reduced ejection fraction (HFmrEF) (HCC) 09/29/2021   EF 45%, moderate LVH   Hyperlipidemia associated with type 2 diabetes mellitus (HCC)    Hypertension    Jaundice    Obesity    TOA (tubo-ovarian abscess) 09/2019    Past Surgical History:  Procedure Laterality Date   CHOLECYSTECTOMY     IRRIGATION AND DEBRIDEMENT ABSCESS N/A 05/02/2020   Procedure: IRRIGATION AND DEBRIDEMENT ABDOMINAL and CHEST WALL NECROTIZING FASCITITS;  Surgeon: Sheldon Standing, MD;  Location: [facility];  Service: General;  Laterality: N/A;   liver stent      Current Medications: Current Meds  Medication Sig   albuterol  (VENTOLIN  HFA) 108 (90 Base) MCG/ACT inhaler Inhale 2 puffs into the  lungs every 4 (four) hours as needed for wheezing or shortness of breath.   ALEVE  220 MG tablet Take 1,100 mg by mouth daily as needed (for headaches or mild pain).   amLODipine  (NORVASC ) 10 MG tablet Take 1 tablet (10 mg total) by mouth daily.   aspirin  81 MG chewable tablet Chew 1 tablet (81 mg total) by mouth daily.   carvedilol  (COREG ) 25 MG tablet Take 1 tablet (25 mg total) by mouth 2 (two) times daily.   Continuous Glucose Receiver (FREESTYLE LIBRE 3 READER) DEVI Use as directed for continuous glucose monitoring.   Continuous Glucose Sensor (FREESTYLE LIBRE 3 PLUS SENSOR) MISC Replace every 15 days   hydrALAZINE  (APRESOLINE ) 25 MG tablet Take 1 tablet (25 mg total) by mouth 2 (two) times daily.   hydrochlorothiazide  (HYDRODIURIL ) 25 MG tablet Take 1 tablet (25 mg total) by mouth daily.   hydrOXYzine  (ATARAX ) 10 MG tablet Take 1 tablet (10 mg total) by mouth as needed for anxiety.   icosapent Ethyl (VASCEPA) 1 g capsule Take by mouth 2 (two) times daily.   Insulin  Glargine Solostar (LANTUS ) 100 UNIT/ML Solostar Pen Inject 38 Units into the skin daily.   insulin  lispro (HUMALOG ) 100 UNIT/ML KwikPen Inject 22 Units into the skin 3 (three) times daily.   loperamide  (IMODIUM ) 2 MG capsule Take 1 capsule (2 mg total) by mouth 4 (four) times daily as needed for diarrhea or loose stools.   metoprolol  tartrate (LOPRESSOR ) 100 MG tablet Take 2 hours prior to CT   nystatin  cream (MYCOSTATIN ) Apply to affected  area 2 times daily   predniSONE  (DELTASONE ) 50 MG tablet Take 50 mg (one tablet) 13 hours before procedure. Take 50 mg (one tablet) 7 house before procedure. Take 50 mg (one tablet) before leaving home before procedure.   rosuvastatin  (CRESTOR ) 20 MG tablet Take 1 tablet (20 mg total) by mouth daily.   spironolactone  (ALDACTONE ) 50 MG tablet Take 1 tablet (50 mg total) by mouth daily.   torsemide  (DEMADEX ) 20 MG tablet Take 2 tablets (40 mg total) by mouth daily.   valsartan  (DIOVAN ) 320 MG  tablet Take 1 tablet (320 mg total) by mouth daily.     Allergies:   Prochlorperazine  and Contrast media [iodinated contrast media]   Social History   Socioeconomic History   Marital status: Single    Spouse name: Not on file   Number of children: Not on file   Years of education: Not on file   Highest education level: GED or equivalent  Occupational History   Not on file  Tobacco Use   Smoking status: Former    Current packs/day: 0.00    Types: Cigarettes    Quit date: 10/08/2023    Years since quitting: 0.6   Smokeless tobacco: Never  Vaping Use   Vaping status: Former  Substance and Sexual Activity   Alcohol use: Not Currently    Comment: occ   Drug use: No   Sexual activity: Not on file  Other Topics Concern   Not on file  Social History Narrative   Are you right handed or left handed? right   Are you currently employed ? yes   What is your current occupation?[employer]   Do you live at home alone?with kids   Who lives with you?    What type of home do you live in: 1 story or 2 story? two    Caffeine 1-3 a day   Social Drivers of Health   Financial Resource Strain: Medium Risk (05/11/2024)   Overall Financial Resource Strain (CARDIA)    Difficulty of Paying Living Expenses: Somewhat hard  Food Insecurity: Patient Declined (05/11/2024)   Hunger Vital Sign    Worried About Running Out of Food in the Last Year: Patient declined    Ran Out of Food in the Last Year: Patient declined  Transportation Needs: No Transportation Needs (05/11/2024)   PRAPARE - Administrator, Civil Service (Medical): No    Lack of Transportation (Non-Medical): No  Physical Activity: Insufficiently Active (05/11/2024)   Exercise Vital Sign    Days of Exercise per Week: 1 day    Minutes of Exercise per Session: 10 min  Stress: Stress Concern Present (05/11/2024)   [employer] of Occupational Health - Occupational Stress Questionnaire    Feeling of Stress: To some extent  Social  Connections: Socially Isolated (05/11/2024)   Social Connection and Isolation Panel    Frequency of Communication with Friends and Family: Once a week    Frequency of Social Gatherings with Friends and Family: Once a week    Attends Religious Services: 1 to 4 times per year    Active Member of [employer] or Organizations: No    Attends Engineer, structural: Not on file    Marital Status: Separated     Family History: The patient's family history includes Diabetes in her father; Hypertension in her father and mother.  ROS:   Review of Systems  Constitution: Negative for decreased appetite, fever and weight gain.  HENT: Negative for congestion, ear  discharge, hoarse voice and sore throat.   Eyes: Negative for discharge, redness, vision loss in right eye and visual halos.  Cardiovascular: Negative for chest pain, dyspnea on exertion, leg swelling, orthopnea and palpitations.  Respiratory: Negative for cough, hemoptysis, shortness of breath and snoring.   Endocrine: Negative for heat intolerance and polyphagia.  Hematologic/Lymphatic: Negative for bleeding problem. Does not bruise/bleed easily.  Skin: Negative for flushing, nail changes, rash and suspicious lesions.  Musculoskeletal: Negative for arthritis, joint pain, muscle cramps, myalgias, neck pain and stiffness.  Gastrointestinal: Negative for abdominal pain, bowel incontinence, diarrhea and excessive appetite.  Genitourinary: Negative for decreased libido, genital sores and incomplete emptying.  Neurological: Negative for brief paralysis, focal weakness, headaches and loss of balance.  Psychiatric/Behavioral: Negative for altered mental status, depression and suicidal ideas.  Allergic/Immunologic: Negative for HIV exposure and persistent infections.    EKGs/Labs/Other Studies Reviewed:    The following studies were reviewed today:   EKG:  None today   Recent Labs: 10/29/2023: TSH 1.13 01/05/2024: B Natriuretic Peptide  59.2 01/06/2024: Magnesium  1.9 02/01/2024: Pro Brain Natriuretic Peptide 353.0 05/23/2024: ALT 9; BUN 11; Creatinine, Ser 0.85; Hemoglobin 9.5; Platelets 262; Potassium 4.0; Sodium 131  Recent Lipid Panel    Component Value Date/Time   CHOL 178 10/29/2023 1148   CHOL 172 12/29/2021 0919   TRIG 417 (H) 10/29/2023 1148   HDL 40 (L) 10/29/2023 1148   HDL 34 (L) 12/29/2021 0919   CHOLHDL 4.5 10/29/2023 1148   VLDL 45 (H) 10/14/2023 0554   LDLCALC  10/29/2023 1148     Comment:     . LDL cholesterol not calculated. Triglyceride levels greater than 400 mg/dL invalidate calculated LDL results. . Reference range: <100 . Desirable range <100 mg/dL for primary prevention;   <70 mg/dL for patients with CHD or diabetic patients  with > or = 2 CHD risk factors. SABRA LDL-C is now calculated using the Martin-Hopkins  calculation, which is a validated novel method providing  better accuracy than the Friedewald equation in the  estimation of LDL-C.  Gladis APPLETHWAITE et al. SANDREA. 7986;689(80): 2061-2068  (http://education.[facility].com/faq/FAQ164)    LDLDIRECT 64.0 03/26/2023 1009    Physical Exam:    VS:  BP (!) 138/92 (BP Location: Right Arm, Patient Position: Sitting, Cuff Size: Large)   Pulse 78   Ht 5' 7 (1.702 m)   Wt 286 lb 8 oz (130 kg)   LMP 04/30/2024   SpO2 99%   BMI 44.87 kg/m     Wt Readings from Last 3 Encounters:  05/30/24 286 lb 8 oz (130 kg)  05/14/24 291 lb 3.2 oz (132.1 kg)  01/06/24 293 lb 4.8 oz (133 kg)     GEN: Well nourished, well developed in no acute distress HEENT: Normal NECK: No JVD; No carotid bruits LYMPHATICS: No lymphadenopathy CARDIAC: S1S2 noted,RRR, no murmurs, rubs, gallops RESPIRATORY:  Clear to auscultation without rales, wheezing or rhonchi  ABDOMEN: Soft, non-tender, non-distended, +bowel sounds, no guarding. EXTREMITIES: No edema, No cyanosis, no clubbing MUSCULOSKELETAL:  No deformity  SKIN: Warm and dry NEUROLOGIC:  Alert and oriented  x 3, non-focal PSYCHIATRIC:  Normal affect, good insight  ASSESSMENT:    1. Precordial pain   2. Pre-procedure lab exam   3. Primary hypertension   4. History of CVA (cerebrovascular accident)   5. Insulin -requiring or dependent type II diabetes mellitus (HCC)   6. Obesity, Class III, BMI 40-49.9 (morbid obesity)   7. Chronic diastolic heart failure (HCC)   8.  Former smoker   9. Mixed hyperlipidemia   10. Depressed left ventricular ejection fraction    PLAN:    Chest pain concerning for angina Intermittent chest pain with dyspnea. High risk for coronary artery disease due to diabetes, hypertension, and stroke. Recent ED visit for chest pain. The symptoms chest pain is concerning, this patient does have intermediate risk for coronary artery disease and at this time I would like to pursue an ischemic evaluation in this patient.  Shared decision a coronary CTA at this time is appropriate.  I have discussed with the patient about the testing.  The patient has no IV contrast allergy and is agreeable to proceed with this test.    Hypertension Hypertension with current blood pressure of 138/92 mmHg. On multiple antihypertensives. Blood pressure control improving but not optimal. - Prescribe hydralazine  25 mg twice daily to further control blood pressure.  Type 2 diabetes mellitus Poorly controlled blood glucose levels despite multiple insulin  adjustments. Endocrinologist focused on insulin  therapy. Reports insulin  causing gastrointestinal issues.   Hx of Cva - cont current medication regimen.   She has stopped smoking, I am happy for her.   Plan to repeat echo to reassess her LV function   The patient is in agreement with the above plan. The patient left the office in stable condition.  The patient will follow up in   Medication Adjustments/Labs and Tests Ordered: Current medicines are reviewed at length with the patient today.  Concerns regarding medicines are outlined above.   Orders Placed This Encounter  Procedures   CT CORONARY MORPH W/CTA COR W/SCORE W/CA W/CM &/OR WO/CM   Comp Met (CMET)   Magnesium    Meds ordered this encounter  Medications   metoprolol  tartrate (LOPRESSOR ) 100 MG tablet    Sig: Take 2 hours prior to CT    Dispense:  1 tablet    Refill:  0   hydrALAZINE  (APRESOLINE ) 25 MG tablet    Sig: Take 1 tablet (25 mg total) by mouth 2 (two) times daily.    Dispense:  180 tablet    Refill:  3   predniSONE  (DELTASONE ) 50 MG tablet    Sig: Take 50 mg (one tablet) 13 hours before procedure. Take 50 mg (one tablet) 7 house before procedure. Take 50 mg (one tablet) before leaving home before procedure.    Dispense:  3 tablet    Refill:  0    Patient Instructions  Medication Instructions:  Your physician has recommended you make the following change in your medication:  START: Hydralazine  25 mg twice daily  *If you need a refill on your cardiac medications before your next appointment, please call your pharmacy*  Lab Work: CMET, Mag If you have labs (blood work) drawn today and your tests are completely normal, you will receive your results only by: MyChart Message (if you have MyChart) OR A paper copy in the mail If you have any lab test that is abnormal or we need to change your treatment, we will call you to review the results.  Testing/Procedures:  Your cardiac CT will be scheduled at one of the below locations:   [facility] [address] [place], [place] [address] [phone] (Severe contrast allergies only)  OR   [facility] and V[facility] Tower [address]  [place], [place] [address] [phone]   If scheduled at [facility], please arrive at the [facility] and Children's Entrance (Entrance C2) of [facility] 30 minutes prior  to test start time. You can use the FREE valet parking offered at entrance C (encouraged to control the heart rate for the test)  Proceed to the [facility] Radiology Department (first floor) to check-in and test prep.  All radiology patients and guests should use entrance C2 at [facility], accessed from [facility], even though the hospital's physical address listed is [address]  If scheduled at the Heart and Vascular Tower at [address], please enter the parking lot using the [address] entrance and use the FREE valet service at the patient drop-off area. Enter the building and check-in with registration on the main floor.  Please follow these instructions carefully (unless otherwise directed):  An IV will be required for this test and Nitroglycerin  will be given.   On the Night Before the Test: Be sure to Drink plenty of water. Do not consume any caffeinated/decaffeinated beverages or chocolate 12 hours prior to your test. Do not take any antihistamines 12 hours prior to your test.  If the patient has contrast allergy: Patient will need a prescription for Prednisone  and very clear instructions (as follows): Prednisone  50 mg - take 13 hours prior to test Take another Prednisone  50 mg 7 hours prior to test Take another Prednisone  50 mg 1 hour prior to test Take Benadryl  50 mg 1 hour prior to test Patient must complete all four doses of above prophylactic medications. Patient will need a ride after test due to Benadryl .  On the Day of the Test: Drink plenty of water until 1 hour prior to the test. Do not eat any food 1 hour prior to test. You may take your regular medications prior to the test.  Take metoprolol  (Lopressor ) two hours prior to test. If you take Furosemide /Hydrochlorothiazide /Spironolactone /Chlorthalidone, please HOLD on the morning of the test. Patients who wear a continuous glucose monitor MUST remove the device prior to scanning. FEMALES- please wear underwire-free bra if available, avoid dresses & tight clothing      After the Test: Drink plenty of water. After  receiving IV contrast, you may experience a mild flushed feeling. This is normal. On occasion, you may experience a mild rash up to 24 hours after the test. This is not dangerous. If this occurs, you can take Benadryl  25 mg, Zyrtec, Claritin, or Allegra and increase your fluid intake. (Patients taking Tikosyn should avoid Benadryl , and may take Zyrtec, Claritin, or Allegra) If you experience trouble breathing, this can be serious. If it is severe call 911 IMMEDIATELY. If it is mild, please call our office.  We will call to schedule your test 2-4 weeks out understanding that some insurance companies will need an authorization prior to the service being performed.   For more information and frequently asked questions, please visit our website : [email or web address]  For non-scheduling related questions, please contact the cardiac imaging nurse navigator should you have any questions/concerns: Cardiac Imaging Nurse Navigators Direct Office Dial : [phone]   For scheduling needs, including cancellations and rescheduling, please call [place], [phone].   Follow-Up: At [facility], you and your health needs are our priority.  As part of our continuing mission to provide you with exceptional heart care, our providers are all part of one team.  This team includes your primary Cardiologist (physician) and Advanced Practice Providers or APPs (Physician Assistants and Nurse Practitioners) who all work together to provide you with the care you need, when you need it.  Your next appointment:  16 week(s)  Provider:   Matix Henshaw, DO             Adopting a Healthy Lifestyle.  Know what a healthy weight is for you (roughly BMI <25) and aim to maintain this   Aim for 7+ servings of fruits and vegetables daily   65-80+ fluid ounces of water or unsweet tea for healthy kidneys   Limit to max 1 drink of alcohol per day; avoid smoking/tobacco   Limit animal fats in  diet for cholesterol and heart health - choose grass fed whenever available   Avoid highly processed foods, and foods high in saturated/trans fats   Aim for low stress - take time to unwind and care for your mental health   Aim for 150 min of moderate intensity exercise weekly for heart health, and weights twice weekly for bone health   Aim for 7-9 hours of sleep daily   When it comes to diets, agreement about the perfect plan isnt easy to find, even among the experts. Experts at the [facility] [employer] developed an idea known as the Healthy Eating Plate. Just imagine a plate divided into logical, healthy portions.   The emphasis is on diet quality:   Load up on vegetables and fruits - one-half of your plate: Aim for color and variety, and remember that potatoes dont count.   Go for whole grains - one-quarter of your plate: Whole wheat, barley, wheat berries, quinoa, oats, brown rice, and foods made with them. If you want pasta, go with whole wheat pasta.   Protein power - one-quarter of your plate: Fish, chicken, beans, and nuts are all healthy, versatile protein sources. Limit red meat.   The diet, however, does go beyond the plate, offering a few other suggestions.   Use healthy plant oils, such as olive, canola, soy, corn, sunflower and peanut. Check the labels, and avoid partially hydrogenated oil, which have unhealthy trans fats.   If youre thirsty, drink water. Coffee and tea are good in moderation, but skip sugary drinks and limit milk and dairy products to one or two daily servings.   The type of carbohydrate in the diet is more important than the amount. Some sources of carbohydrates, such as vegetables, fruits, whole grains, and beans-are healthier than others.   Finally, stay active  Signed, Ibrahim Mcpheeters, DO  06/01/2024 2:13 PM    [facility]

## 2024-06-05 ENCOUNTER — Encounter (HOSPITAL_COMMUNITY): Payer: Self-pay

## 2024-06-06 ENCOUNTER — Telehealth (HOSPITAL_COMMUNITY): Payer: Self-pay | Admitting: *Deleted

## 2024-06-06 NOTE — Telephone Encounter (Signed)
 Reaching out to patient to offer assistance regarding upcoming cardiac imaging study; pt verbalizes understanding of appt date/time, parking situation and where to check in, pre-test NPO status and medications ordered, and verified current allergies; name and call back number provided for further questions should they arise Johney Frame RN Navigator Cardiac Imaging [facility] [phone] office [phone] cell  Patient verbalized understanding of allergy prep.

## 2024-06-09 ENCOUNTER — Ambulatory Visit (HOSPITAL_COMMUNITY)
Admission: RE | Admit: 2024-06-09 | Discharge: 2024-06-09 | Disposition: A | Discharge status: Home / Self Care | Source: Ambulatory Visit | Attending: Cardiology | Admitting: Cardiology

## 2024-06-09 DIAGNOSIS — I517 Cardiomegaly: Secondary | ICD-10-CM | POA: Insufficient documentation

## 2024-06-09 DIAGNOSIS — R072 Precordial pain: Secondary | ICD-10-CM

## 2024-06-09 MED ORDER — DIPHENHYDRAMINE HCL 50 MG/ML IJ SOLN
INTRAMUSCULAR | Status: AC
  Filled 2024-06-09: qty 1

## 2024-06-09 MED ORDER — DIPHENHYDRAMINE HCL 50 MG/ML IJ SOLN
25.0000 mg | Freq: Once | INTRAMUSCULAR | Status: AC
  Administered 2024-06-09: 25 mg via INTRAVENOUS

## 2024-06-09 MED ORDER — IOHEXOL 350 MG/ML SOLN
100.0000 mL | Freq: Once | INTRAVENOUS | Status: AC | PRN
  Administered 2024-06-09: 100 mL via INTRAVENOUS

## 2024-06-09 MED ORDER — NITROGLYCERIN 0.4 MG SL SUBL
0.8000 mg | SUBLINGUAL_TABLET | Freq: Once | SUBLINGUAL | Status: AC
  Administered 2024-06-09: 0.8 mg via SUBLINGUAL

## 2024-06-10 ENCOUNTER — Telehealth: Payer: Self-pay

## 2024-06-10 ENCOUNTER — Telehealth: Payer: Self-pay | Admitting: Cardiology

## 2024-06-10 NOTE — Telephone Encounter (Signed)
 PT came in this morning for a letter for DSS I informed her I could send a note she did not tell me anything that it needs to say please call her on her mom's phone [phone].

## 2024-06-10 NOTE — Telephone Encounter (Signed)
 Called pt. She was able to get a note from her PCP. She no longer needs a note from this office.

## 2024-06-10 NOTE — Telephone Encounter (Signed)
 Patient was a walk in and requested that letter from PCP Ngetich, [name], NP be given to her stating that she's unable to work and under provider care. This is a requirement from her Educational psychologist. After verbally consulting with PCP Ngetich, Roxan BROCKS, NP she has approved this letter being type. I have typed letter, received PCP Ngetich, [name], NP signature and given to patient. Nothing else follows at this time. Message routed to PCP Ngetich, Roxan BROCKS, NP as RICK.

## 2024-07-01 ENCOUNTER — Ambulatory Visit: Payer: Self-pay | Admitting: Cardiology

## 2024-07-15 ENCOUNTER — Inpatient Hospital Stay (HOSPITAL_BASED_OUTPATIENT_CLINIC_OR_DEPARTMENT_OTHER)
Admission: EM | Admit: 2024-07-15 | Discharge: 2024-07-22 | DRG: 872 | Disposition: A | Discharge status: Home / Self Care | Attending: Student | Admitting: Student

## 2024-07-15 ENCOUNTER — Other Ambulatory Visit: Payer: Self-pay

## 2024-07-15 ENCOUNTER — Emergency Department (HOSPITAL_BASED_OUTPATIENT_CLINIC_OR_DEPARTMENT_OTHER): Admitting: Radiology

## 2024-07-15 ENCOUNTER — Emergency Department (HOSPITAL_BASED_OUTPATIENT_CLINIC_OR_DEPARTMENT_OTHER)

## 2024-07-15 DIAGNOSIS — Z1152 Encounter for screening for COVID-19: Secondary | ICD-10-CM

## 2024-07-15 DIAGNOSIS — Z91041 Radiographic dye allergy status: Secondary | ICD-10-CM

## 2024-07-15 DIAGNOSIS — I639 Cerebral infarction, unspecified: Secondary | ICD-10-CM | POA: Diagnosis present

## 2024-07-15 DIAGNOSIS — N12 Tubulo-interstitial nephritis, not specified as acute or chronic: Secondary | ICD-10-CM | POA: Diagnosis present

## 2024-07-15 DIAGNOSIS — E1165 Type 2 diabetes mellitus with hyperglycemia: Secondary | ICD-10-CM | POA: Diagnosis present

## 2024-07-15 DIAGNOSIS — I5042 Chronic combined systolic (congestive) and diastolic (congestive) heart failure: Secondary | ICD-10-CM | POA: Diagnosis present

## 2024-07-15 DIAGNOSIS — Z833 Family history of diabetes mellitus: Secondary | ICD-10-CM

## 2024-07-15 DIAGNOSIS — E66813 Obesity, class 3: Secondary | ICD-10-CM | POA: Diagnosis present

## 2024-07-15 DIAGNOSIS — Z6841 Body Mass Index (BMI) 40.0 and over, adult: Secondary | ICD-10-CM

## 2024-07-15 DIAGNOSIS — E876 Hypokalemia: Secondary | ICD-10-CM | POA: Diagnosis present

## 2024-07-15 DIAGNOSIS — E7849 Other hyperlipidemia: Secondary | ICD-10-CM | POA: Diagnosis present

## 2024-07-15 DIAGNOSIS — A419 Sepsis, unspecified organism: Principal | ICD-10-CM | POA: Diagnosis present

## 2024-07-15 DIAGNOSIS — Z7984 Long term (current) use of oral hypoglycemic drugs: Secondary | ICD-10-CM

## 2024-07-15 DIAGNOSIS — R059 Cough, unspecified: Secondary | ICD-10-CM | POA: Diagnosis present

## 2024-07-15 DIAGNOSIS — Z8673 Personal history of transient ischemic attack (TIA), and cerebral infarction without residual deficits: Secondary | ICD-10-CM

## 2024-07-15 DIAGNOSIS — Z8249 Family history of ischemic heart disease and other diseases of the circulatory system: Secondary | ICD-10-CM

## 2024-07-15 DIAGNOSIS — Z87891 Personal history of nicotine dependence: Secondary | ICD-10-CM

## 2024-07-15 DIAGNOSIS — I2489 Other forms of acute ischemic heart disease: Secondary | ICD-10-CM | POA: Diagnosis present

## 2024-07-15 DIAGNOSIS — Z794 Long term (current) use of insulin: Secondary | ICD-10-CM

## 2024-07-15 DIAGNOSIS — R590 Localized enlarged lymph nodes: Secondary | ICD-10-CM | POA: Diagnosis present

## 2024-07-15 DIAGNOSIS — I110 Hypertensive heart disease with heart failure: Secondary | ICD-10-CM | POA: Diagnosis present

## 2024-07-15 DIAGNOSIS — T39395A Adverse effect of other nonsteroidal anti-inflammatory drugs [NSAID], initial encounter: Secondary | ICD-10-CM | POA: Diagnosis present

## 2024-07-15 DIAGNOSIS — Z8616 Personal history of COVID-19: Secondary | ICD-10-CM

## 2024-07-15 DIAGNOSIS — Z888 Allergy status to other drugs, medicaments and biological substances status: Secondary | ICD-10-CM

## 2024-07-15 DIAGNOSIS — L03115 Cellulitis of right lower limb: Secondary | ICD-10-CM | POA: Diagnosis present

## 2024-07-15 DIAGNOSIS — Z7982 Long term (current) use of aspirin: Secondary | ICD-10-CM

## 2024-07-15 DIAGNOSIS — E1169 Type 2 diabetes mellitus with other specified complication: Secondary | ICD-10-CM | POA: Diagnosis present

## 2024-07-15 DIAGNOSIS — Z79899 Other long term (current) drug therapy: Secondary | ICD-10-CM

## 2024-07-15 DIAGNOSIS — I509 Heart failure, unspecified: Principal | ICD-10-CM

## 2024-07-15 DIAGNOSIS — R6510 Systemic inflammatory response syndrome (SIRS) of non-infectious origin without acute organ dysfunction: Secondary | ICD-10-CM

## 2024-07-15 LAB — COMPREHENSIVE METABOLIC PANEL WITH GFR
ALT: 13 U/L (ref 0–44)
AST: 19 U/L (ref 15–41)
Albumin: 3.2 g/dL — ABNORMAL LOW (ref 3.5–5.0)
Alkaline Phosphatase: 94 U/L (ref 38–126)
Anion gap: 11 (ref 5–15)
BUN: 8 mg/dL (ref 6–20)
CO2: 25 mmol/L (ref 22–32)
Calcium: 8.9 mg/dL (ref 8.9–10.3)
Chloride: 98 mmol/L (ref 98–111)
Creatinine, Ser: 0.9 mg/dL (ref 0.44–1.00)
GFR, Estimated: >60 mL/min (ref >60)
Glucose, Bld: 338 mg/dL — ABNORMAL HIGH (ref 70–99)
Potassium: 3.4 mmol/L — ABNORMAL LOW (ref 3.5–5.1)
Sodium: 133 mmol/L — ABNORMAL LOW (ref 135–145)
Total Bilirubin: 0.5 mg/dL (ref 0.0–1.2)
Total Protein: 6.7 g/dL (ref 6.5–8.1)

## 2024-07-15 LAB — CBC WITH DIFFERENTIAL/PLATELET
Abs Immature Granulocytes: 0.26 K/uL — ABNORMAL HIGH (ref 0.00–0.07)
Basophils Absolute: 0 K/uL (ref 0.0–0.1)
Basophils Relative: 0 %
Eosinophils Absolute: 0 K/uL (ref 0.0–0.5)
Eosinophils Relative: 0 %
HCT: 32.7 % — ABNORMAL LOW (ref 36.0–46.0)
Hemoglobin: 11 g/dL — ABNORMAL LOW (ref 12.0–15.0)
Immature Granulocytes: 1 %
Lymphocytes Relative: 4 %
Lymphs Abs: 0.9 K/uL (ref 0.7–4.0)
MCH: 26.7 pg (ref 26.0–34.0)
MCHC: 33.6 g/dL (ref 30.0–36.0)
MCV: 79.4 fL — ABNORMAL LOW (ref 80.0–100.0)
Monocytes Absolute: 1.4 K/uL — ABNORMAL HIGH (ref 0.1–1.0)
Monocytes Relative: 6 %
Neutro Abs: 20.3 K/uL — ABNORMAL HIGH (ref 1.7–7.7)
Neutrophils Relative %: 89 %
Platelets: 349 K/uL (ref 150–400)
RBC: 4.12 MIL/uL (ref 3.87–5.11)
RDW: 13.7 % (ref 11.5–15.5)
WBC: 22.9 K/uL — ABNORMAL HIGH (ref 4.0–10.5)
nRBC: 0 % (ref 0.0–0.2)

## 2024-07-15 LAB — URINALYSIS, ROUTINE W REFLEX MICROSCOPIC
Bacteria, UA: NONE SEEN
Bilirubin Urine: NEGATIVE
Glucose, UA: 500 mg/dL — AB
Ketones, ur: NEGATIVE mg/dL
Nitrite: NEGATIVE
Protein, ur: 100 mg/dL — AB
Specific Gravity, Urine: 1.007 (ref 1.005–1.030)
pH: 6.5 (ref 5.0–8.0)

## 2024-07-15 LAB — TROPONIN T, HIGH SENSITIVITY
Troponin T High Sensitivity: 27 ng/L — ABNORMAL HIGH (ref 0–19)
Troponin T High Sensitivity: 28 ng/L — ABNORMAL HIGH (ref 0–19)

## 2024-07-15 LAB — PRO BRAIN NATRIURETIC PEPTIDE: Pro Brain Natriuretic Peptide: 1386 pg/mL — ABNORMAL HIGH (ref <300.0)

## 2024-07-15 LAB — RESP PANEL BY RT-PCR (RSV, FLU A&B, COVID)  RVPGX2
Influenza A by PCR: NEGATIVE
Influenza B by PCR: NEGATIVE
Resp Syncytial Virus by PCR: NEGATIVE
SARS Coronavirus 2 by RT PCR: NEGATIVE

## 2024-07-15 LAB — HCG, SERUM, QUALITATIVE: Preg, Serum: NEGATIVE

## 2024-07-15 MED ORDER — LACTATED RINGERS IV BOLUS: 500.0000 mL | Freq: Once | INTRAVENOUS | Status: DC

## 2024-07-15 MED ORDER — FUROSEMIDE 10 MG/ML IJ SOLN
20.0000 mg | Freq: Once | INTRAMUSCULAR | Status: AC
  Administered 2024-07-15: 20 mg via INTRAVENOUS
  Filled 2024-07-15: qty 2

## 2024-07-15 MED ORDER — LORAZEPAM 2 MG/ML IJ SOLN
1.0000 mg | Freq: Once | INTRAMUSCULAR | Status: AC
  Administered 2024-07-15: 1 mg via INTRAVENOUS
  Filled 2024-07-15: qty 1

## 2024-07-15 MED ORDER — SODIUM CHLORIDE 0.9 % IV SOLN
2.0000 g | Freq: Once | INTRAVENOUS | Status: AC
  Administered 2024-07-15: 2 g via INTRAVENOUS
  Filled 2024-07-15: qty 20

## 2024-07-15 MED ORDER — ACETAMINOPHEN 325 MG PO TABS
650.0000 mg | ORAL_TABLET | Freq: Once | ORAL | Status: AC
  Administered 2024-07-15: 650 mg via ORAL
  Filled 2024-07-15: qty 2

## 2024-07-15 MED ORDER — IBUPROFEN 400 MG PO TABS
400.0000 mg | ORAL_TABLET | Freq: Once | ORAL | Status: AC
  Administered 2024-07-15: 400 mg via ORAL
  Filled 2024-07-15: qty 1

## 2024-07-15 MED ORDER — MORPHINE SULFATE (PF) 4 MG/ML IV SOLN
4.0000 mg | Freq: Once | INTRAVENOUS | Status: AC
  Administered 2024-07-15: 4 mg via INTRAVENOUS
  Filled 2024-07-15: qty 1

## 2024-07-15 NOTE — ED Provider Notes (Signed)
 [place] EMERGENCY DEPARTMENT AT [facility] Provider Note   CSN: [id number] Arrival date & time: 07/15/24  1452     Patient presents with: Cough   Carolyn Zimmerman is a 38 y.o. female.  Cough Associated symptoms: chills   Patient is a 38 year old female presenting ED today for concerns for generalized bodyaches, congestion, chills that started acutely this morning.  Denies any sick contacts.  Reports that she has had some bilateral lower extremity swelling, noting that she has had increased time walking, sitting and not putting her legs up and has not been taking her torsemide .  Endorses headache (pulsatile), urinary frequency, watery diarrhea which has been ongoing x 3 months.  Denies fever, blurry vision, vertigo, tinnitus, otalgia, dysphagia, odynophagia, shortness of breath, cough, nausea, vomiting, dysuria, hematuria, melena, hematochezia     Prior to Admission medications   Medication Sig Start Date End Date Taking? Authorizing Provider  albuterol  (VENTOLIN  HFA) 108 (90 Base) MCG/ACT inhaler Inhale 2 puffs into the lungs every 4 (four) hours as needed for wheezing or shortness of breath. 11/02/23   Gherghe, Costin M, MD  ALEVE  220 MG tablet Take 1,100 mg by mouth daily as needed (for headaches or mild pain).    [provider]  amLODipine  (NORVASC ) 10 MG tablet Take 1 tablet (10 mg total) by mouth daily. 11/02/23   Gherghe, Costin M, MD  aspirin  81 MG chewable tablet Chew 1 tablet (81 mg total) by mouth daily. 10/29/23   Ngetich, [name], NP  carvedilol  (COREG ) 25 MG tablet Take 1 tablet (25 mg total) by mouth 2 (two) times daily. 11/02/23   Gherghe, Costin M, MD  Continuous Glucose Receiver (FREESTYLE LIBRE 3 READER) DEVI Use as directed for continuous glucose monitoring. 04/15/24   [provider]  Continuous Glucose Sensor (FREESTYLE LIBRE 3 PLUS SENSOR) MISC Replace every 15 days 04/15/24   [provider]  hydrALAZINE  (APRESOLINE ) 25 MG  tablet Take 1 tablet (25 mg total) by mouth 2 (two) times daily. 05/30/24 08/28/24  Tobb, Kardie, DO  hydrochlorothiazide  (HYDRODIURIL ) 25 MG tablet Take 1 tablet (25 mg total) by mouth daily. 05/14/24 08/12/24  Ngetich, [name], NP  hydrOXYzine  (ATARAX ) 10 MG tablet Take 1 tablet (10 mg total) by mouth as needed for anxiety. 05/14/24   Ngetich, [name], NP  icosapent Ethyl (VASCEPA) 1 g capsule Take by mouth 2 (two) times daily.    [provider]  Insulin  Glargine Solostar (LANTUS ) 100 UNIT/ML Solostar Pen Inject 38 Units into the skin daily. 01/08/24   Christobal Guadalajara, MD  insulin  lispro (HUMALOG ) 100 UNIT/ML KwikPen Inject 22 Units into the skin 3 (three) times daily. 01/07/24   Ngetich, [name], NP  loperamide  (IMODIUM ) 2 MG capsule Take 1 capsule (2 mg total) by mouth 4 (four) times daily as needed for diarrhea or loose stools. 05/23/24   Mannie Fairy DASEN, DO  metFORMIN  (GLUCOPHAGE -XR) 500 MG 24 hr tablet Take 2 tablets (1,000 mg total) by mouth in the morning and at bedtime. Patient not taking: Reported on 05/30/2024 11/02/23   Gherghe, Costin M, MD  metoprolol  tartrate (LOPRESSOR ) 100 MG tablet Take 2 hours prior to CT 05/30/24   Tobb, Kardie, DO  nystatin  cream (MYCOSTATIN ) Apply to affected area 2 times daily 05/23/24   Mannie Fairy T, DO  predniSONE  (DELTASONE ) 50 MG tablet Take 50 mg (one tablet) 13 hours before procedure. Take 50 mg (one tablet) 7 house before procedure. Take 50 mg (one tablet) before leaving home before  procedure. 05/30/24   Tobb, Kardie, DO  rosuvastatin  (CRESTOR ) 20 MG tablet Take 1 tablet (20 mg total) by mouth daily. 11/02/23   Gherghe, Costin M, MD  spironolactone  (ALDACTONE ) 50 MG tablet Take 1 tablet (50 mg total) by mouth daily. 05/14/24   Ngetich, [name], NP  torsemide  (DEMADEX ) 20 MG tablet Take 2 tablets (40 mg total) by mouth daily. 05/14/24 08/12/24  Ngetich, [name], NP  valsartan  (DIOVAN ) 320 MG tablet Take 1 tablet (320 mg total) by mouth daily. 05/14/24    Ngetich, [name], NP    Allergies: Prochlorperazine  and Contrast media [iodinated contrast media]    Review of Systems  Constitutional:  Positive for chills.       Generalized bodyaches, malaise  HENT:  Positive for congestion.   Respiratory:  Positive for cough.   Gastrointestinal:  Positive for diarrhea.  Genitourinary:  Positive for frequency.  All other systems reviewed and are negative.   Updated Vital Signs BP (!) 153/86   Pulse 97   Temp 99 F (37.2 C)   Resp 15   SpO2 100%   Physical Exam Vitals and nursing note reviewed.  Constitutional:      General: She is not in acute distress.    Appearance: Normal appearance. She is ill-appearing. She is not diaphoretic.  HENT:     Head: Normocephalic and atraumatic.     Mouth/Throat:     Mouth: Mucous membranes are moist.     Pharynx: Oropharynx is clear. No oropharyngeal exudate or posterior oropharyngeal erythema.  Eyes:     General: No scleral icterus.       Right eye: No discharge.        Left eye: No discharge.     Extraocular Movements: Extraocular movements intact.     Conjunctiva/sclera: Conjunctivae normal.     Pupils: Pupils are equal, round, and reactive to light.  Cardiovascular:     Rate and Rhythm: Regular rhythm. Tachycardia present.     Pulses: Normal pulses.     Heart sounds: Normal heart sounds. No murmur heard.    No friction rub. No gallop.  Pulmonary:     Effort: Pulmonary effort is normal. No respiratory distress.     Breath sounds: No stridor. No wheezing, rhonchi or rales.  Chest:     Chest wall: No tenderness.  Abdominal:     General: Abdomen is flat. There is no distension.     Palpations: Abdomen is soft.     Tenderness: There is no abdominal tenderness. There is no right CVA tenderness, left CVA tenderness, guarding or rebound.  Musculoskeletal:        General: No swelling, deformity or signs of injury.     Cervical back: Normal range of motion. No rigidity.     Right lower leg: Edema  present.     Left lower leg: Edema present.     Comments: Bilateral 2+ pitting edema  Skin:    General: Skin is warm and dry.     Findings: No bruising, erythema or lesion.  Neurological:     General: No focal deficit present.     Mental Status: She is alert and oriented to person, place, and time. Mental status is at baseline.     Sensory: No sensory deficit.     Motor: No weakness.  Psychiatric:        Mood and Affect: Mood normal.     (all labs ordered are listed, but only abnormal results are displayed) Labs  Reviewed  CBC WITH DIFFERENTIAL/PLATELET - Abnormal; Notable for the following components:      Result Value   WBC 22.9 (*)    Hemoglobin 11.0 (*)    HCT 32.7 (*)    MCV 79.4 (*)    Neutro Abs 20.3 (*)    Monocytes Absolute 1.4 (*)    Abs Immature Granulocytes 0.26 (*)    All other components within normal limits  COMPREHENSIVE METABOLIC PANEL WITH GFR - Abnormal; Notable for the following components:   Sodium 133 (*)    Potassium 3.4 (*)    Glucose, Bld 338 (*)    Albumin 3.2 (*)    All other components within normal limits  PRO BRAIN NATRIURETIC PEPTIDE - Abnormal; Notable for the following components:   Pro Brain Natriuretic Peptide 1,386.0 (*)    All other components within normal limits  URINALYSIS, ROUTINE W REFLEX MICROSCOPIC - Abnormal; Notable for the following components:   Glucose, UA 500 (*)    Hgb urine dipstick SMALL (*)    Protein, ur 100 (*)    Leukocytes,Ua SMALL (*)    All other components within normal limits  TROPONIN T, HIGH SENSITIVITY - Abnormal; Notable for the following components:   Troponin T High Sensitivity 27 (*)    All other components within normal limits  TROPONIN T, HIGH SENSITIVITY - Abnormal; Notable for the following components:   Troponin T High Sensitivity 28 (*)    All other components within normal limits  RESP PANEL BY RT-PCR (RSV, FLU A&B, COVID)  RVPGX2  URINE CULTURE  CULTURE, BLOOD (ROUTINE X 2)  CULTURE, BLOOD  (ROUTINE X 2)  HCG, SERUM, QUALITATIVE    EKG: EKG Interpretation Date/Time:  Tuesday July 15 2024 21:30:43 EDT Ventricular Rate:  92 PR Interval:  179 QRS Duration:  111 QT Interval:  393 QTC Calculation: 487 R Axis:   47  Text Interpretation: Sinus rhythm Abnormal T, consider ischemia, lateral leads Confirmed by Jerrol Agent ([id number]) on 07/15/2024 10:29:35 PM  Radiology: CT Chest Wo Contrast Result Date: 07/15/2024 EXAM: CT CHEST WITHOUT CONTRAST 07/15/2024 10:46:16 PM TECHNIQUE: CT of the chest was performed without the administration of intravenous contrast. Multiplanar reformatted images are provided for review. Automated exposure control, iterative reconstruction, and/or weight based adjustment of the mA/kV was utilized to reduce the radiation dose to as low as reasonably achievable. COMPARISON: Chest x-ray dated 07/15/2024 and Cardiac CT dated 06/09/2024. CLINICAL HISTORY: Pneumonia, complication suspected, xray done. Pneumonia; Triage note:; C/o cough, congestion, body aches starting this morning. FINDINGS: MEDIASTINUM: The heart is mildly enlarged. There is a small pericardial effusion similar to prior. The central airways are clear. Thyroid gland is prominent in size. LYMPH NODES: There are enlarged prevascular lymph nodes measuring up to 14 mm. Bilateral axillary lymph nodes are prominent but nonenlarged. LUNGS AND PLEURA: No focal consolidation or pulmonary edema. No pleural effusion or pneumothorax. SOFT TISSUES/BONES: No acute abnormality of the bones or soft tissues. UPPER ABDOMEN: Limited images of the upper abdomen demonstrates no acute abnormality. IMPRESSION: 1. No acute findings. 2. Mild cardiomegaly with small pericardial effusion, similar to prior. 3. Prominent thyroid gland. Consider non-emergent thyroid ultrasound given thyroid enlargement. 4. Enlarged prevascular lymph nodes measuring up to 14 mm. Correlate clinically and consider follow-up as indicated. Electronically  signed by: Greig Pique MD 07/15/2024 10:49 PM EDT RP Workstation: [id number]   CT ABDOMEN PELVIS WO CONTRAST Result Date: 07/15/2024 EXAM: CT ABDOMEN AND PELVIS WITHOUT CONTRAST 07/15/2024 08:17:19 PM TECHNIQUE: CT of the  abdomen and pelvis was performed without the administration of intravenous contrast. Multiplanar reformatted images are provided for review. Automated exposure control, iterative reconstruction, and/or weight-based adjustment of the mA/kV was utilized to reduce the radiation dose to as low as reasonably achievable. COMPARISON: CT abdomen and pelvis. CLINICAL HISTORY: Abdominal pain, acute, nonlocalized. Triage note: C/o cough, congestion, body aches starting this morning. FINDINGS: LOWER CHEST: There is a trace pericardial effusion. LIVER: The liver is enlarged. GALLBLADDER AND BILE DUCTS: Gallbladder is surgically absent. No biliary ductal dilatation. SPLEEN: No acute abnormality. PANCREAS: No acute abnormality. ADRENAL GLANDS: No acute abnormality. KIDNEYS, URETERS AND BLADDER: No stones in the kidneys or ureters. No hydronephrosis. There is minimal bilateral perinephric fat stranding which is nonspecific and unchanged. Urinary bladder is unremarkable. GI AND BOWEL: Stomach demonstrates no acute abnormality. The appendix appears. There is no bowel obstruction. PERITONEUM AND RETROPERITONEUM: There is trace free fluid in the pelvis. No free air. VASCULATURE: Aorta is normal in caliber. LYMPH NODES: Enlarged bilateral inguinal lymph nodes measuring up to 18 mm short axis. REPRODUCTIVE ORGANS: No acute abnormality. BONES AND SOFT TISSUES: No acute osseous abnormality. There is mild subcutaneous stranding in the lower anterior abdominal wall. IMPRESSION: 1. Enlarged bilateral inguinal lymph nodes measuring up to 18 mm short axis. 2. Trace pericardial effusion. 3. Hepatomegaly. 4. Minimal bilateral perinephric fat stranding, nonspecific and unchanged. Correlate clinically for infection.  Electronically signed by: Greig Pique MD 07/15/2024 08:23 PM EDT RP Workstation: [id number]   DG Chest 2 View Result Date: 07/15/2024 EXAM: 2 VIEW(S) XRAY OF THE CHEST 07/15/2024 06:09:00 PM COMPARISON: Chest x-ray 02/01/2024. CLINICAL HISTORY: shortness of breath, bilateral leg swelling, chest pain. C/o cough, congestion, body aches starting this morning. FINDINGS: LUNGS AND PLEURA: No focal pulmonary opacity. No pulmonary edema. No pleural effusion. No pneumothorax. HEART AND MEDIASTINUM: Heart is borderline enlarged, unchanged. BONES AND SOFT TISSUES: No acute osseous abnormality. IMPRESSION: 1. No acute process. 2. Borderline enlarged heart, unchanged. Electronically signed by: Greig Pique MD 07/15/2024 06:22 PM EDT RP Workstation: [id number]    Procedures   Medications Ordered in the ED  acetaminophen  (TYLENOL ) tablet 650 mg (650 mg Oral Given 07/15/24 1802)  ibuprofen  (ADVIL ) tablet 400 mg (400 mg Oral Given 07/15/24 1802)  furosemide  (LASIX ) injection 20 mg (20 mg Intravenous Given 07/15/24 2104)  cefTRIAXone  (ROCEPHIN ) 2 g in sodium chloride  0.9 % 100 mL IVPB (2 g Intravenous New Bag/Given 07/15/24 2207)  morphine  (PF) 4 MG/ML injection 4 mg (4 mg Intravenous Given 07/15/24 2207)  LORazepam  (ATIVAN ) injection 1 mg (1 mg Intravenous Given 07/15/24 2207)     Medical Decision Making Amount and/or Complexity of Data Reviewed Labs: ordered. Radiology: ordered. ECG/medicine tests: ordered.  Risk OTC drugs. Prescription drug management. Decision regarding hospitalization.  This patient is a 38 year old female who presents to the ED for concern of acute onset congestion, urinary frequency, malaise, body aches that she noticed this morning upon waking.  No known sick contacts.  Endorsing chronic diarrhea which is persistent, watery as well as endorsing bilateral lower extremity edema, noting that she had also not been taking her torsemide  and had had her legs in the sitting position  and walking more often.  Noted to have had chronic bilateral Lower extremity edema.  On physical exam, patient is in no acute distress, afebrile, alert and orient x 4, speaking in full sentences, nontachypneic.  Notably tachycardic.  Oropharynx clear without signs of infection.  LCTAB, RRR, no murmur.  Noted to have generalized, mild chest wall  tenderness, abdominal tenderness, back tenderness to palpation.  Legs bilaterally edematous, with 2+ pitting.  Nonerythematous.  Unremarkable exam otherwise.  Considering patient's complex past medical history, believe it reasonable with patient's current symptoms to obtain baseline labs as well as heart labs considering patient's bilateral extremity edema which is acutely worse despite having chronic lower extremity edema, and also not having taken her torsemide .   Patient notably has leukocytosis, with current ill appearing presentation, borderline tachycardic and SIRS positive with questionable pyelonephritis and congestive heart failure.  Will seek to admit for empiric antibiotics as well as observation overnight and diuresis.  Patient care transferred to Dr. Sherida or   Differential diagnoses prior to evaluation: The emergent differential diagnosis includes, but is not limited to,  .upper respiratory infection, lower respiratory infection, allergies, asthma, irritants, sinus/esophageal foreign body, interstitial lung disease . This is not an exhaustive differential.   Past Medical History / Co-morbidities / Social History: Diabetes, heart failure, TOA, HTN, prolonged QT, necrotizing fasciitis, CVA 2023, CHF  Additional history: Chart reviewed. Pertinent results include:   Last MI cardiology on 05/30/2024.  Noted to have ordered Lopressor , hydralazine , prednisone .  Recently had cardiac CT done which showed coronary calcium  score of 44.2.  With this being nondiagnostic with unable to exclude obstructive CAD.  Last seen in the emergency department  on 9/5/which was, head CT and pelvis at that time  Lab Tests/Imaging studies: I personally interpreted labs/imaging and the pertinent results include:    Respiratory panel negative.    CT chest shows small pericardial effusion similar to previous as well as thyromegaly and lymphadenopathy.  CT abdomen shows Enlarged related degree lymph nodes as well as trace pericardial effusion and bilateral perinephric stranding unchanged from previous  I agree with the radiologist interpretation.  Cardiac monitoring: EKG obtained and interpreted by myself and attending physician which shows: Sinus rhythm with abnormal T waves  EKG Interpretation Date/Time:  Tuesday July 15 2024 21:30:43 EDT Ventricular Rate:  92 PR Interval:  179 QRS Duration:  111 QT Interval:  393 QTC Calculation: 487 R Axis:   47  Text Interpretation: Sinus rhythm Abnormal T, consider ischemia, lateral leads Confirmed by Jerrol Agent ([id number]) on 07/15/2024 10:29:35 PM          Medications: I ordered medication including Rocephin , morphine , Ativan , Lasix .  I have reviewed the patients home medicines and have made adjustments as needed.  Critical Interventions:  Social Determinants of Health: None  Disposition: After consideration of the diagnostic results and the patients response to treatment, I feel that the patient would benefit from admission, patient care transferred to Dr. Alfornia   Final diagnoses:  SIRS (systemic inflammatory response syndrome) (HCC)  Pyelonephritis  Acute on chronic heart failure, unspecified heart failure type [facility])    ED Discharge Orders     None          Beola Terrall GORMAN DEVONNA 07/16/24 0002    Jerrol Agent, MD 07/16/24 1210

## 2024-07-15 NOTE — ED Triage Notes (Signed)
 C/o cough, congestion, body aches starting this morning.

## 2024-07-15 NOTE — Plan of Care (Signed)
 Plan of Care Note for accepted transfer   Patient name: Carolyn Zimmerman FMW:[id number] DOB: [birth year]  Facility requesting transfer: [facility] Requesting Provider: Beola Terrall RAMAN, PA-C  Reason for transfer: CHF exacerbation, possible pyelonephritis Facility course: 38 year old female with history of CVA, diabetes, hypertension, hyperlipidemia, HFmrEF presented with complaints of cough, congestion, generalized abdominal pain/flank pain, urinary frequency, and bilateral lower extremity edema.  She stopped taking torsemide  several months ago.  Afebrile.  Not hypoxic.  WBC count 22.9, hemoglobin 11.0 (stable), sodium 133, potassium 3.4, glucose 338, bicarb 25, COVID/influenza/RSV PCR negative, troponin 27> 28, proBNP 1386.  UA with negative nitrate, small leukocytes, 11-20 WBCs, and no bacteria.  CT chest without contrast showing: IMPRESSION: 1. No acute findings. 2. Mild cardiomegaly with small pericardial effusion, similar to prior. 3. Prominent thyroid gland. Consider non-emergent thyroid ultrasound given thyroid enlargement. 4. Enlarged prevascular lymph nodes measuring up to 14 mm. Correlate clinically and consider follow-up as indicated.  CT abdomen pelvis without contrast showing: IMPRESSION: 1. Enlarged bilateral inguinal lymph nodes measuring up to 18 mm short axis. 2. Trace pericardial effusion. 3. Hepatomegaly. 4. Minimal bilateral perinephric fat stranding, nonspecific and unchanged. Correlate clinically for infection.  Patient was given Tylenol , IV Lasix  20 mg, ibuprofen , Ativan , morphine , and ceftriaxone .  Plan of care: The patient is accepted for admission to Telemetry unit at [facility]  [facility] will assume care on arrival to accepting facility. Until arrival, care as per EDP. However, [facility] available 24/7 for questions and assistance.  Check [email or web address] for on-call coverage.  Nursing staff, please call [facility] Admits & Consults System-Wide number under Amion on  patient's arrival so appropriate admitting provider can evaluate the pt.

## 2024-07-16 ENCOUNTER — Encounter (HOSPITAL_BASED_OUTPATIENT_CLINIC_OR_DEPARTMENT_OTHER): Payer: Self-pay | Admitting: Internal Medicine

## 2024-07-16 DIAGNOSIS — L0390 Cellulitis, unspecified: Secondary | ICD-10-CM | POA: Diagnosis not present

## 2024-07-16 DIAGNOSIS — A419 Sepsis, unspecified organism: Secondary | ICD-10-CM | POA: Diagnosis not present

## 2024-07-16 DIAGNOSIS — L03115 Cellulitis of right lower limb: Secondary | ICD-10-CM

## 2024-07-16 DIAGNOSIS — I509 Heart failure, unspecified: Secondary | ICD-10-CM

## 2024-07-16 DIAGNOSIS — L02415 Cutaneous abscess of right lower limb: Secondary | ICD-10-CM

## 2024-07-16 LAB — CBG MONITORING, ED
Glucose-Capillary: 331 mg/dL — ABNORMAL HIGH (ref 70–99)
Glucose-Capillary: 224 mg/dL — ABNORMAL HIGH (ref 70–99)

## 2024-07-16 LAB — COMPREHENSIVE METABOLIC PANEL WITH GFR
ALT: 15 U/L (ref 0–44)
AST: 19 U/L (ref 15–41)
Albumin: 1.9 g/dL — ABNORMAL LOW (ref 3.5–5.0)
Alkaline Phosphatase: 64 U/L (ref 38–126)
Anion gap: 15 (ref 5–15)
BUN: 9 mg/dL (ref 6–20)
CO2: 25 mmol/L (ref 22–32)
Calcium: 7.8 mg/dL — ABNORMAL LOW (ref 8.9–10.3)
Chloride: 93 mmol/L — ABNORMAL LOW (ref 98–111)
Creatinine, Ser: 1.09 mg/dL — ABNORMAL HIGH (ref 0.44–1.00)
GFR, Estimated: >60 mL/min (ref >60)
Glucose, Bld: 135 mg/dL — ABNORMAL HIGH (ref 70–99)
Potassium: 3 mmol/L — ABNORMAL LOW (ref 3.5–5.1)
Sodium: 133 mmol/L — ABNORMAL LOW (ref 135–145)
Total Bilirubin: 0.4 mg/dL (ref 0.0–1.2)
Total Protein: 5.9 g/dL — ABNORMAL LOW (ref 6.5–8.1)

## 2024-07-16 LAB — CBC
HCT: 31.2 % — ABNORMAL LOW (ref 36.0–46.0)
Hemoglobin: 10 g/dL — ABNORMAL LOW (ref 12.0–15.0)
MCH: 25.8 pg — ABNORMAL LOW (ref 26.0–34.0)
MCHC: 32.1 g/dL (ref 30.0–36.0)
MCV: 80.4 fL (ref 80.0–100.0)
Platelets: 343 K/uL (ref 150–400)
RBC: 3.88 MIL/uL (ref 3.87–5.11)
RDW: 14 % (ref 11.5–15.5)
WBC: 17.6 K/uL — ABNORMAL HIGH (ref 4.0–10.5)
nRBC: 0 % (ref 0.0–0.2)

## 2024-07-16 LAB — GLUCOSE, CAPILLARY
Glucose-Capillary: 179 mg/dL — ABNORMAL HIGH (ref 70–99)
Glucose-Capillary: 239 mg/dL — ABNORMAL HIGH (ref 70–99)

## 2024-07-16 LAB — CK: Total CK: 215 U/L (ref 38–234)

## 2024-07-16 LAB — BRAIN NATRIURETIC PEPTIDE: B Natriuretic Peptide: 141.4 pg/mL — ABNORMAL HIGH (ref 0.0–100.0)

## 2024-07-16 LAB — HEMOGLOBIN A1C
Hgb A1c MFr Bld: 13.5 % — ABNORMAL HIGH (ref 4.8–5.6)
Mean Plasma Glucose: 340.75 mg/dL

## 2024-07-16 LAB — TSH: TSH: 0.298 u[IU]/mL — ABNORMAL LOW (ref 0.350–4.500)

## 2024-07-16 LAB — MAGNESIUM: Magnesium: 1.6 mg/dL — ABNORMAL LOW (ref 1.7–2.4)

## 2024-07-16 MED ORDER — OXYCODONE HCL 5 MG PO TABS
5.0000 mg | ORAL_TABLET | Freq: Four times a day (QID) | ORAL | Status: DC | PRN
  Administered 2024-07-17 – 2024-07-20 (×9): 5 mg via ORAL
  Filled 2024-07-16 (×9): qty 1

## 2024-07-16 MED ORDER — POTASSIUM CHLORIDE CRYS ER 20 MEQ PO TBCR
40.0000 meq | EXTENDED_RELEASE_TABLET | ORAL | Status: AC
  Administered 2024-07-16 (×2): 40 meq via ORAL
  Filled 2024-07-16 (×2): qty 2

## 2024-07-16 MED ORDER — INSULIN ASPART 100 UNIT/ML IJ SOLN
0.0000 [IU] | Freq: Three times a day (TID) | INTRAMUSCULAR | Status: DC
  Administered 2024-07-16: 3 [IU] via SUBCUTANEOUS
  Administered 2024-07-16: 5 [IU] via SUBCUTANEOUS
  Administered 2024-07-16: 11 [IU] via SUBCUTANEOUS
  Administered 2024-07-17: 5 [IU] via SUBCUTANEOUS
  Administered 2024-07-17: 11 [IU] via SUBCUTANEOUS

## 2024-07-16 MED ORDER — ACETAMINOPHEN 650 MG RE SUPP: 650.0000 mg | Freq: Four times a day (QID) | RECTAL | Status: DC | PRN

## 2024-07-16 MED ORDER — ACETAMINOPHEN 500 MG PO TABS
1000.0000 mg | ORAL_TABLET | Freq: Once | ORAL | Status: AC
  Administered 2024-07-16: 1000 mg via ORAL
  Filled 2024-07-16: qty 2

## 2024-07-16 MED ORDER — POLYETHYLENE GLYCOL 3350 17 G PO PACK
17.0000 g | PACK | Freq: Two times a day (BID) | ORAL | Status: DC | PRN
  Administered 2024-07-18: 17 g via ORAL
  Filled 2024-07-16: qty 1

## 2024-07-16 MED ORDER — MORPHINE SULFATE (PF) 4 MG/ML IV SOLN
4.0000 mg | Freq: Once | INTRAVENOUS | Status: AC
  Administered 2024-07-16: 4 mg via INTRAVENOUS
  Filled 2024-07-16: qty 1

## 2024-07-16 MED ORDER — HYDROMORPHONE HCL 1 MG/ML IJ SOLN
0.5000 mg | INTRAMUSCULAR | Status: DC | PRN
  Administered 2024-07-16 – 2024-07-21 (×6): 0.5 mg via INTRAVENOUS
  Filled 2024-07-16 (×6): qty 1

## 2024-07-16 MED ORDER — INSULIN ASPART 100 UNIT/ML IJ SOLN
3.0000 [IU] | Freq: Three times a day (TID) | INTRAMUSCULAR | Status: DC
  Administered 2024-07-16: 3 [IU] via SUBCUTANEOUS

## 2024-07-16 MED ORDER — INSULIN GLARGINE-YFGN 100 UNIT/ML ~~LOC~~ SOLN
20.0000 [IU] | Freq: Every day | SUBCUTANEOUS | Status: DC
  Administered 2024-07-16: 20 [IU] via SUBCUTANEOUS
  Filled 2024-07-16 (×2): qty 0.2

## 2024-07-16 MED ORDER — ENOXAPARIN SODIUM 80 MG/0.8ML IJ SOSY
65.0000 mg | PREFILLED_SYRINGE | INTRAMUSCULAR | Status: DC
  Administered 2024-07-16 – 2024-07-21 (×6): 65 mg via SUBCUTANEOUS
  Filled 2024-07-16 (×6): qty 0.8

## 2024-07-16 MED ORDER — SODIUM CHLORIDE 0.9 % IV SOLN
2.0000 g | INTRAVENOUS | Status: DC
  Administered 2024-07-16 – 2024-07-17 (×2): 2 g via INTRAVENOUS
  Filled 2024-07-16 (×2): qty 20

## 2024-07-16 MED ORDER — MAGNESIUM SULFATE 2 GM/50ML IV SOLN
2.0000 g | Freq: Once | INTRAVENOUS | Status: AC
  Administered 2024-07-16: 2 g via INTRAVENOUS
  Filled 2024-07-16: qty 50

## 2024-07-16 MED ORDER — ACETAMINOPHEN 325 MG PO TABS
650.0000 mg | ORAL_TABLET | Freq: Four times a day (QID) | ORAL | Status: DC | PRN
  Administered 2024-07-17 – 2024-07-22 (×3): 650 mg via ORAL
  Filled 2024-07-16 (×3): qty 2

## 2024-07-16 MED ORDER — INSULIN ASPART 100 UNIT/ML IJ SOLN
0.0000 [IU] | Freq: Every day | INTRAMUSCULAR | Status: DC
  Administered 2024-07-16: 2 [IU] via SUBCUTANEOUS
  Administered 2024-07-17: 4 [IU] via SUBCUTANEOUS
  Administered 2024-07-18: 3 [IU] via SUBCUTANEOUS
  Administered 2024-07-20 – 2024-07-21 (×2): 2 [IU] via SUBCUTANEOUS
  Filled 2024-07-16: qty 2

## 2024-07-16 MED ORDER — SENNOSIDES-DOCUSATE SODIUM 8.6-50 MG PO TABS
1.0000 | ORAL_TABLET | Freq: Two times a day (BID) | ORAL | Status: DC | PRN
  Administered 2024-07-19: 1 via ORAL
  Filled 2024-07-16: qty 1

## 2024-07-16 NOTE — ED Notes (Signed)
[facility] here to transport pt 

## 2024-07-16 NOTE — H&P (Signed)
 History and Physical    Carolyn Zimmerman FMW:[id number] DOB: 01-Jan-1986 DOA: 07/15/2024  PCP: Leonarda Roxan BROCKS, NP Patient coming from: Home.  Independently ambulates at baseline.  Chief Complaint: My whole body hurts  HPI: Carolyn Zimmerman is a 38 year old F with PMH of HFmrEF, CVA, IDDM-2, HTN and morbid obesity presented to [facility] ED with generalized body pain.  Patient reports generalized body pain including legs, back and stomach for about a day.  Also reports chills and chest congestion for about 1 day.  Describes stomach pain as burning sensation.  Mainly over central upper abdomen.  Denies radiation.  She denies cough, runny nose, nasal congestion or sore throat.  She denies nausea or vomiting.  She thinks she might have some vaginal discharge but unsure since she also had bowel accident when she was in ED at [facility].  Denies vulvar irritation, dysuria, frequency or urgency.  She says she had urinary frequency when she was taking torsemide  daily and decided to decrease to every other day.  Denies sick contacts.  She denies sedentary life, long car rides or flights.  Patient quit smoking earlier this year.  Denies drinking alcohol or recreational drug use.  Not sexually active.  Interested in cardiopulmonary cessation event of sudden cardiopulmonary arrest.  In ED, slightly tachycardic to 105.  BP 162/108 but improved to 120/66.  WBC 23 with left shift.  Troponin 27 and 28.  proBNP 1386.  UA does not suggest UTI.  CT chest without acute finding but prominent thyroid gland.  CT abdomen and pelvis with bilateral inguinal lymphadenopathy and hepatomegaly.  Patient received ibuprofen , ceftriaxone  and Lasix .  Admission requested for possible CHF exacerbation.  ROS All review of system negative except for pertinent positives and negatives as history of present illness above.  PMH Past Medical History:  Diagnosis Date   Abscess of chest wall 06/15/2017   Coronavirus infection 05/02/2020    CVA (cerebral vascular accident) (HCC) 07/14/2022   L MCA CVA, watershed infarct   Diabetes mellitus    Heart failure with mildly reduced ejection fraction (HFmrEF) (HCC) 09/29/2021   EF 45%, moderate LVH   Hyperlipidemia associated with type 2 diabetes mellitus (HCC)    Hypertension    Jaundice    Obesity    TOA (tubo-ovarian abscess) 09/2019   PSH Past Surgical History:  Procedure Laterality Date   CHOLECYSTECTOMY     IRRIGATION AND DEBRIDEMENT ABSCESS N/A 05/02/2020   Procedure: IRRIGATION AND DEBRIDEMENT ABDOMINAL and CHEST WALL NECROTIZING FASCITITS;  Surgeon: Sheldon Standing, MD;  Location: [facility];  Service: General;  Laterality: N/A;   liver stent     Fam HX Family History  Problem Relation Age of Onset   Hypertension Mother    Diabetes Father    Hypertension Father     Social Hx  reports that she quit smoking about 9 months ago. Her smoking use included cigarettes. She has never used smokeless tobacco. She reports that she does not currently use alcohol. She reports that she does not use drugs.  Allergy Allergies  Allergen Reactions   Prochlorperazine  Anxiety    Panic attacks   Contrast Media [Iodinated Contrast Media] Hives and Swelling   Home Meds Prior to Admission medications   Medication Sig Start Date End Date Taking? Authorizing Provider  albuterol  (VENTOLIN  HFA) 108 (90 Base) MCG/ACT inhaler Inhale 2 puffs into the lungs every 4 (four) hours as needed for wheezing or shortness of breath. 11/02/23   Gherghe, Costin M, MD  ALEVE  220 MG  tablet Take 1,100 mg by mouth daily as needed (for headaches or mild pain).    [provider]  amLODipine  (NORVASC ) 10 MG tablet Take 1 tablet (10 mg total) by mouth daily. 11/02/23   Gherghe, Costin M, MD  aspirin  81 MG chewable tablet Chew 1 tablet (81 mg total) by mouth daily. 10/29/23   Ngetich, [name], NP  carvedilol  (COREG ) 25 MG tablet Take 1 tablet (25 mg total) by mouth 2 (two) times daily. 11/02/23   Gherghe,  Costin M, MD  Continuous Glucose Receiver (FREESTYLE LIBRE 3 READER) DEVI Use as directed for continuous glucose monitoring. 04/15/24   [provider]  Continuous Glucose Sensor (FREESTYLE LIBRE 3 PLUS SENSOR) MISC Replace every 15 days 04/15/24   [provider]  hydrALAZINE  (APRESOLINE ) 25 MG tablet Take 1 tablet (25 mg total) by mouth 2 (two) times daily. 05/30/24 08/28/24  Tobb, Kardie, DO  hydrochlorothiazide  (HYDRODIURIL ) 25 MG tablet Take 1 tablet (25 mg total) by mouth daily. 05/14/24 08/12/24  Ngetich, [name], NP  hydrOXYzine  (ATARAX ) 10 MG tablet Take 1 tablet (10 mg total) by mouth as needed for anxiety. 05/14/24   Ngetich, [name], NP  icosapent Ethyl (VASCEPA) 1 g capsule Take by mouth 2 (two) times daily.    [provider]  Insulin  Glargine Solostar (LANTUS ) 100 UNIT/ML Solostar Pen Inject 38 Units into the skin daily. 01/08/24   Christobal Guadalajara, MD  insulin  lispro (HUMALOG ) 100 UNIT/ML KwikPen Inject 22 Units into the skin 3 (three) times daily. 01/07/24   Ngetich, [name], NP  loperamide  (IMODIUM ) 2 MG capsule Take 1 capsule (2 mg total) by mouth 4 (four) times daily as needed for diarrhea or loose stools. 05/23/24   Mannie Pac T, DO  metFORMIN  (GLUCOPHAGE -XR) 500 MG 24 hr tablet Take 2 tablets (1,000 mg total) by mouth in the morning and at bedtime. Patient not taking: Reported on 05/30/2024 11/02/23   Gherghe, Costin M, MD  metoprolol  tartrate (LOPRESSOR ) 100 MG tablet Take 2 hours prior to CT 05/30/24   Tobb, Kardie, DO  nystatin  cream (MYCOSTATIN ) Apply to affected area 2 times daily 05/23/24   Mannie Pac T, DO  predniSONE  (DELTASONE ) 50 MG tablet Take 50 mg (one tablet) 13 hours before procedure. Take 50 mg (one tablet) 7 house before procedure. Take 50 mg (one tablet) before leaving home before procedure. 05/30/24   Tobb, Kardie, DO  rosuvastatin  (CRESTOR ) 20 MG tablet Take 1 tablet (20 mg total) by mouth daily. 11/02/23   Gherghe, Costin M, MD  spironolactone   (ALDACTONE ) 50 MG tablet Take 1 tablet (50 mg total) by mouth daily. 05/14/24   Ngetich, [name], NP  torsemide  (DEMADEX ) 20 MG tablet Take 2 tablets (40 mg total) by mouth daily. 05/14/24 08/12/24  Ngetich, [name], NP  valsartan  (DIOVAN ) 320 MG tablet Take 1 tablet (320 mg total) by mouth daily. 05/14/24   Ngetich, Roxan BROCKS, NP    Physical Exam: Vitals:   07/16/24 1000 07/16/24 1050 07/16/24 1300 07/16/24 1400  BP:  120/66    Pulse:  98    Resp:  14    Temp: 98.7 F (37.1 C)  98.4 F (36.9 C)   TempSrc: Oral     SpO2:  99%    Weight:    134.6 kg  Height:    5' 7.5 (1.715 m)    GENERAL: No acute distress.  Appears well.  HEENT: MMM.  Vision and hearing grossly intact.  NECK: Supple.  No  apparent JVD.  RESP:  No IWOB. Good air movement bilaterally. CVS:  RRR. Heart sounds normal.  ABD/GI/GU: Bowel sounds present. Soft. Non tender.  No CVA tenderness. MSK/EXT:  Moves extremities.  1+ BLE edema.  RLE erythema to her knee.  SKIN: RLE erythema to below her knee.  Increased warmth to touch in RLE.  Small cracks over lateral aspect of right heel. NEURO: Awake, alert and oriented appropriately.  No gross deficit.  PSYCH: Calm. Normal affect.   Personally Reviewed Radiological Exams See HPI   Personally Reviewed Labs: CBC: Recent Labs  Lab 07/15/24 1803  WBC 22.9*  NEUTROABS 20.3*  HGB 11.0*  HCT 32.7*  MCV 79.4*  PLT 349   Basic Metabolic Panel: Recent Labs  Lab 07/15/24 1803  NA 133*  K 3.4*  CL 98  CO2 25  GLUCOSE 338*  BUN 8  CREATININE 0.90  CALCIUM  8.9   GFR: Estimated Creatinine Clearance: 124.8 mL/min (by C-G formula based on SCr of 0.9 mg/dL). Liver Function Tests: Recent Labs  Lab 07/15/24 1803  AST 19  ALT 13  ALKPHOS 94  BILITOT 0.5  PROT 6.7  ALBUMIN 3.2*   No results for input(s): LIPASE, AMYLASE in the last 168 hours. No results for input(s): AMMONIA in the last 168 hours. Coagulation Profile: No results for input(s): INR,  PROTIME in the last 168 hours. Cardiac Enzymes: No results for input(s): CKTOTAL, CKMB, CKMBINDEX, TROPONINI in the last 168 hours. BNP (last 3 results) Recent Labs    02/01/24 2319 07/15/24 1803  PROBNP 353.0* 1,386.0*   HbA1C: No results for input(s): HGBA1C in the last 72 hours. CBG: Recent Labs  Lab 07/16/24 0834 07/16/24 1153  GLUCAP 331* 224*   Lipid Profile: No results for input(s): CHOL, HDL, LDLCALC, TRIG, CHOLHDL, LDLDIRECT in the last 72 hours. Thyroid Function Tests: No results for input(s): TSH, T4TOTAL, FREET4, T3FREE, THYROIDAB in the last 72 hours. Anemia Panel: No results for input(s): VITAMINB12, FOLATE, FERRITIN, TIBC, IRON, RETICCTPCT in the last 72 hours. Urine analysis:    Component Value Date/Time   COLORURINE YELLOW 07/15/2024 1857   APPEARANCEUR CLEAR 07/15/2024 1857   LABSPEC 1.007 07/15/2024 1857   PHURINE 6.5 07/15/2024 1857   GLUCOSEU 500 (A) 07/15/2024 1857   HGBUR SMALL (A) 07/15/2024 1857   BILIRUBINUR NEGATIVE 07/15/2024 1857   BILIRUBINUR Negative 05/14/2024 1205   KETONESUR NEGATIVE 07/15/2024 1857   PROTEINUR 100 (A) 07/15/2024 1857   UROBILINOGEN negative (A) 05/14/2024 1205   UROBILINOGEN 1.0 01/19/2012 0015   NITRITE NEGATIVE 07/15/2024 1857   LEUKOCYTESUR SMALL (A) 07/15/2024 1857     Assessment and plan: Sepsis due to RLE cellulitis: Patient has RLE erythema with increased warmth to touch consistent with cellulitis.  She has poorly controlled diabetes.  He also has chills and myalgia.  She has significant leukocytosis with tachycardia on presentation. - Continue IV ceftriaxone  pending cultures. - Follow-up blood cultures  Bilateral lower extremity edema: Has BLE edema.  Unclear how much of this is acute.  She is on amlodipine  10 mg daily which could contribute. -Hold amlodipine . - Antibiotics as above. - Hold of diuretics - Lower extremity venous Doppler  Chest congestion: No  history of asthma or COPD.  She has no URI symptoms.  proBNP slightly elevated.  Received IV Lasix  in ED.  Creatinine slightly up probably due to NSAID. - Check echocardiogram - Check full RVP  Myalgia: Could be could do cellulitis - Check CK and full RVP - Follow-up blood culture  to rule out bacteremia - Multimodal pain control  Chronic HFmrEF: TTE in 20/2025 with LVEF of 40 to 45% GH G1DD.  She has BLE edema.  proBNP slightly elevated.  Inconsistently uses torsemide  at home.  Received IV Lasix  in ED. Creatinine slightly went up. - Hold diuretics for now - Update echocardiogram -Continue home Coreg  - Strict intake and output, daily weight, renal functions and electrolyte  Poorly controlled IDDM-2 with hyperglycemia: A1c 13.5%.  Reports using Lantus  30 units at bedtime and Humalog  16 units 3 times daily before meals. Recent Labs  Lab 07/16/24 0834 07/16/24 1153 07/16/24 1639  GLUCAP 331* 224* 179*  -Semglee  20 units daily -NovoLog  3 units 3 times daily with meals -SSI-moderate -Consult diabetic coordinator. -Further adjustment as appropriate  Elevated troponin: Mild without significant delta.  Patient has no chest pain.  Likely demand ischemia.  Recent coronary CTA nondiagnostic. - Continue home Coreg , started on aspirin . - Update echocardiogram  History of CVA: No focal neurodeficit. - Continue aspirin  - Resume statin if CK normal.  Essential hypertension: BP was elevated but improved.  On amlodipine , Coreg ,, Aldactone , torsemide  - Continue home Coreg . - Hold amlodipine  given BLE edema - Hold diuretics given elevated creatinine.  Hepatomegaly: Noted on CT.  Fatty liver?  Bilateral inguinal lymphadenopathy: Likely reactive from cellulitis. - Consider repeat CT once cellulitis fully treated  Hypokalemia -Monitor replenish K and Mg as appropriate  Morbid obesity Body mass index is 45.79 kg/m. - Encourage lifestyle change to lose weight        DVT prophylaxis:  Subcu Lovenox   Code Status: Full code-discussed with patient Family Communication: None at bedside  Consults called: None Admission status: Inpatient.   Mignon ONEIDA Bump MD [facility]  If 7PM-7AM, please contact night-coverage [email or web address]  07/16/2024, 2:30 PM

## 2024-07-16 NOTE — Inpatient Diabetes Management (Signed)
 Inpatient Diabetes Program Recommendations  AACE/ADA: New Consensus Statement on Inpatient Glycemic Control (2015)  Target Ranges:  Prepandial:   less than 140 mg/dL      Peak postprandial:   less than 180 mg/dL (1-2 hours)      Critically ill patients:  140 - 180 mg/dL   Lab Results  Component Value Date   GLUCAP 331 (H) 07/16/2024   HGBA1C 11.3 (H) 10/29/2023    Review of Glycemic Control  Latest Reference Range & Units 07/16/24 08:34  Glucose-Capillary 70 - 99 mg/dL 668 (H)  (H): Data is abnormally high Diabetes history: Type 2 DM Outpatient Diabetes medications: Lantus  38 units every day, Humalog  22 units TID, Metformin  1000 mg BID Current orders for Inpatient glycemic control: Novolog  0-15 units TID & HS  A1C pending  Inpatient Diabetes Program Recommendations:    Consider adding Lantus  18 units every day and Novolog  4 units TID (Assuming patient is consuming >50% of meals)  Thanks, Tinnie Minus, MSN, RNC-OB Diabetes Coordinator [phone] (8a-5p)

## 2024-07-16 NOTE — ED Notes (Signed)
 Patient ambulated to the restroom with assistance. Notes Back pain along with leg pain. Provider notified and pain medication ordered.

## 2024-07-16 NOTE — Plan of Care (Signed)
  Problem: Education: Goal: Individualized Educational Video(s) Outcome: Progressing   Problem: Coping: Goal: Ability to adjust to condition or change in health will improve Outcome: Progressing   Problem: Nutritional: Goal: Maintenance of adequate nutrition will improve Outcome: Progressing

## 2024-07-16 NOTE — ED Notes (Signed)
 Called [facility] to transport the patient to [place] [address]

## 2024-07-17 ENCOUNTER — Observation Stay (HOSPITAL_COMMUNITY)

## 2024-07-17 ENCOUNTER — Inpatient Hospital Stay (HOSPITAL_COMMUNITY)

## 2024-07-17 DIAGNOSIS — M7989 Other specified soft tissue disorders: Secondary | ICD-10-CM | POA: Diagnosis not present

## 2024-07-17 DIAGNOSIS — I2489 Other forms of acute ischemic heart disease: Secondary | ICD-10-CM | POA: Diagnosis present

## 2024-07-17 DIAGNOSIS — Z91041 Radiographic dye allergy status: Secondary | ICD-10-CM | POA: Diagnosis not present

## 2024-07-17 DIAGNOSIS — E66813 Obesity, class 3: Secondary | ICD-10-CM | POA: Diagnosis present

## 2024-07-17 DIAGNOSIS — N12 Tubulo-interstitial nephritis, not specified as acute or chronic: Secondary | ICD-10-CM | POA: Diagnosis present

## 2024-07-17 DIAGNOSIS — I5042 Chronic combined systolic (congestive) and diastolic (congestive) heart failure: Secondary | ICD-10-CM | POA: Diagnosis present

## 2024-07-17 DIAGNOSIS — Z8249 Family history of ischemic heart disease and other diseases of the circulatory system: Secondary | ICD-10-CM | POA: Diagnosis not present

## 2024-07-17 DIAGNOSIS — Z8673 Personal history of transient ischemic attack (TIA), and cerebral infarction without residual deficits: Secondary | ICD-10-CM | POA: Diagnosis not present

## 2024-07-17 DIAGNOSIS — L03115 Cellulitis of right lower limb: Secondary | ICD-10-CM | POA: Diagnosis present

## 2024-07-17 DIAGNOSIS — I5033 Acute on chronic diastolic (congestive) heart failure: Secondary | ICD-10-CM | POA: Diagnosis not present

## 2024-07-17 DIAGNOSIS — R008 Other abnormalities of heart beat: Secondary | ICD-10-CM | POA: Diagnosis not present

## 2024-07-17 DIAGNOSIS — E1169 Type 2 diabetes mellitus with other specified complication: Secondary | ICD-10-CM | POA: Diagnosis present

## 2024-07-17 DIAGNOSIS — E7849 Other hyperlipidemia: Secondary | ICD-10-CM | POA: Diagnosis present

## 2024-07-17 DIAGNOSIS — Z6841 Body Mass Index (BMI) 40.0 and over, adult: Secondary | ICD-10-CM | POA: Diagnosis not present

## 2024-07-17 DIAGNOSIS — Z87891 Personal history of nicotine dependence: Secondary | ICD-10-CM | POA: Diagnosis not present

## 2024-07-17 DIAGNOSIS — L0390 Cellulitis, unspecified: Secondary | ICD-10-CM | POA: Diagnosis not present

## 2024-07-17 DIAGNOSIS — I110 Hypertensive heart disease with heart failure: Secondary | ICD-10-CM | POA: Diagnosis present

## 2024-07-17 DIAGNOSIS — R2241 Localized swelling, mass and lump, right lower limb: Secondary | ICD-10-CM | POA: Diagnosis not present

## 2024-07-17 DIAGNOSIS — I63232 Cerebral infarction due to unspecified occlusion or stenosis of left carotid arteries: Secondary | ICD-10-CM | POA: Diagnosis not present

## 2024-07-17 DIAGNOSIS — A419 Sepsis, unspecified organism: Secondary | ICD-10-CM | POA: Diagnosis present

## 2024-07-17 DIAGNOSIS — E876 Hypokalemia: Secondary | ICD-10-CM | POA: Diagnosis present

## 2024-07-17 DIAGNOSIS — Z79899 Other long term (current) drug therapy: Secondary | ICD-10-CM | POA: Diagnosis not present

## 2024-07-17 DIAGNOSIS — Z7982 Long term (current) use of aspirin: Secondary | ICD-10-CM | POA: Diagnosis not present

## 2024-07-17 DIAGNOSIS — Z794 Long term (current) use of insulin: Secondary | ICD-10-CM | POA: Diagnosis not present

## 2024-07-17 DIAGNOSIS — Z1152 Encounter for screening for COVID-19: Secondary | ICD-10-CM | POA: Diagnosis not present

## 2024-07-17 DIAGNOSIS — T39395A Adverse effect of other nonsteroidal anti-inflammatory drugs [NSAID], initial encounter: Secondary | ICD-10-CM | POA: Diagnosis present

## 2024-07-17 DIAGNOSIS — E1165 Type 2 diabetes mellitus with hyperglycemia: Secondary | ICD-10-CM | POA: Diagnosis present

## 2024-07-17 DIAGNOSIS — Z833 Family history of diabetes mellitus: Secondary | ICD-10-CM | POA: Diagnosis not present

## 2024-07-17 DIAGNOSIS — Z7984 Long term (current) use of oral hypoglycemic drugs: Secondary | ICD-10-CM | POA: Diagnosis not present

## 2024-07-17 DIAGNOSIS — Z8616 Personal history of COVID-19: Secondary | ICD-10-CM | POA: Diagnosis not present

## 2024-07-17 LAB — RESPIRATORY PANEL BY PCR

## 2024-07-17 LAB — GLUCOSE, CAPILLARY
Glucose-Capillary: 229 mg/dL — ABNORMAL HIGH (ref 70–99)
Glucose-Capillary: 328 mg/dL — ABNORMAL HIGH (ref 70–99)
Glucose-Capillary: 225 mg/dL — ABNORMAL HIGH (ref 70–99)
Glucose-Capillary: 265 mg/dL — ABNORMAL HIGH (ref 70–99)

## 2024-07-17 LAB — ECHOCARDIOGRAM COMPLETE
AR max vel: 2.6 cm2
AV Area VTI: 2.78 cm2
AV Area mean vel: 2.54 cm2
AV Mean grad: 7 mmHg
AV Peak grad: 13.5 mmHg
Ao pk vel: 1.84 m/s
Area-P 1/2: 3.83 cm2
Height: 67.5 in
MV M vel: 2.91 m/s
MV Peak grad: 33.9 mmHg
S' Lateral: 4.1 cm
Weight: 4747.83 [oz_av]

## 2024-07-17 LAB — BLOOD CULTURE ID PANEL (REFLEXED) - BCID2

## 2024-07-17 LAB — CBC
HCT: 28.8 % — ABNORMAL LOW (ref 36.0–46.0)
Hemoglobin: 9.5 g/dL — ABNORMAL LOW (ref 12.0–15.0)
MCH: 26.5 pg (ref 26.0–34.0)
MCHC: 33 g/dL (ref 30.0–36.0)
MCV: 80.4 fL (ref 80.0–100.0)
Platelets: 306 K/uL (ref 150–400)
RBC: 3.58 MIL/uL — ABNORMAL LOW (ref 3.87–5.11)
RDW: 13.9 % (ref 11.5–15.5)
WBC: 12.9 K/uL — ABNORMAL HIGH (ref 4.0–10.5)
nRBC: 0 % (ref 0.0–0.2)

## 2024-07-17 LAB — COMPREHENSIVE METABOLIC PANEL WITH GFR
ALT: 16 U/L (ref 0–44)
AST: 16 U/L (ref 15–41)
Albumin: 1.8 g/dL — ABNORMAL LOW (ref 3.5–5.0)
Alkaline Phosphatase: 66 U/L (ref 38–126)
Anion gap: 10 (ref 5–15)
BUN: 10 mg/dL (ref 6–20)
CO2: 23 mmol/L (ref 22–32)
Calcium: 7.8 mg/dL — ABNORMAL LOW (ref 8.9–10.3)
Chloride: 100 mmol/L (ref 98–111)
Creatinine, Ser: 0.98 mg/dL (ref 0.44–1.00)
GFR, Estimated: >60 mL/min (ref >60)
Glucose, Bld: 248 mg/dL — ABNORMAL HIGH (ref 70–99)
Potassium: 3.7 mmol/L (ref 3.5–5.1)
Sodium: 133 mmol/L — ABNORMAL LOW (ref 135–145)
Total Bilirubin: 0.2 mg/dL (ref 0.0–1.2)
Total Protein: 5.6 g/dL — ABNORMAL LOW (ref 6.5–8.1)

## 2024-07-17 LAB — T4, FREE: Free T4: 0.85 ng/dL (ref 0.61–1.12)

## 2024-07-17 LAB — URINE CULTURE: Culture: 10000 — AB

## 2024-07-17 LAB — MAGNESIUM: Magnesium: 1.7 mg/dL (ref 1.7–2.4)

## 2024-07-17 MED ORDER — INSULIN ASPART 100 UNIT/ML IJ SOLN
0.0000 [IU] | Freq: Three times a day (TID) | INTRAMUSCULAR | Status: DC
  Administered 2024-07-17 – 2024-07-18 (×2): 7 [IU] via SUBCUTANEOUS
  Administered 2024-07-18: 15 [IU] via SUBCUTANEOUS
  Administered 2024-07-18: 11 [IU] via SUBCUTANEOUS
  Administered 2024-07-19: 15 [IU] via SUBCUTANEOUS
  Administered 2024-07-19: 11 [IU] via SUBCUTANEOUS
  Administered 2024-07-19 – 2024-07-20 (×3): 4 [IU] via SUBCUTANEOUS
  Administered 2024-07-20 – 2024-07-21 (×2): 7 [IU] via SUBCUTANEOUS
  Administered 2024-07-21 (×2): 4 [IU] via SUBCUTANEOUS
  Administered 2024-07-22: 11 [IU] via SUBCUTANEOUS
  Filled 2024-07-17: qty 4

## 2024-07-17 MED ORDER — CARVEDILOL 25 MG PO TABS
25.0000 mg | ORAL_TABLET | Freq: Two times a day (BID) | ORAL | Status: DC
Start: 2024-07-17 — End: 2024-07-22
  Administered 2024-07-17 – 2024-07-22 (×11): 25 mg via ORAL
  Filled 2024-07-17 (×11): qty 1

## 2024-07-17 MED ORDER — ROSUVASTATIN CALCIUM 20 MG PO TABS
20.0000 mg | ORAL_TABLET | Freq: Every day | ORAL | Status: DC
  Administered 2024-07-17 – 2024-07-22 (×6): 20 mg via ORAL
  Filled 2024-07-17 (×6): qty 1

## 2024-07-17 MED ORDER — GABAPENTIN 300 MG PO CAPS
300.0000 mg | ORAL_CAPSULE | Freq: Two times a day (BID) | ORAL | Status: DC
  Administered 2024-07-17 – 2024-07-22 (×11): 300 mg via ORAL
  Filled 2024-07-17 (×11): qty 1

## 2024-07-17 MED ORDER — SPIRONOLACTONE 25 MG PO TABS
25.0000 mg | ORAL_TABLET | Freq: Every day | ORAL | Status: DC
  Administered 2024-07-17 – 2024-07-20 (×4): 25 mg via ORAL
  Filled 2024-07-17 (×4): qty 1

## 2024-07-17 MED ORDER — VANCOMYCIN HCL 750 MG/150ML IV SOLN
750.0000 mg | Freq: Three times a day (TID) | INTRAVENOUS | Status: DC
  Filled 2024-07-17 (×2): qty 150

## 2024-07-17 MED ORDER — INSULIN GLARGINE-YFGN 100 UNIT/ML ~~LOC~~ SOLN
25.0000 [IU] | Freq: Every day | SUBCUTANEOUS | Status: DC
  Administered 2024-07-17: 25 [IU] via SUBCUTANEOUS
  Filled 2024-07-17 (×2): qty 0.25

## 2024-07-17 MED ORDER — ASPIRIN 81 MG PO CHEW
81.0000 mg | CHEWABLE_TABLET | Freq: Every day | ORAL | Status: DC
  Administered 2024-07-17 – 2024-07-22 (×6): 81 mg via ORAL
  Filled 2024-07-17 (×6): qty 1

## 2024-07-17 MED ORDER — MAGNESIUM SULFATE 2 GM/50ML IV SOLN
2.0000 g | Freq: Once | INTRAVENOUS | Status: AC
Start: 2024-07-17 — End: 2024-07-17
  Administered 2024-07-17: 2 g via INTRAVENOUS
  Filled 2024-07-17: qty 50

## 2024-07-17 MED ORDER — VANCOMYCIN HCL 10 G IV SOLR
2500.0000 mg | Freq: Once | INTRAVENOUS | Status: AC
  Administered 2024-07-17: 2500 mg via INTRAVENOUS
  Filled 2024-07-17: qty 25
  Filled 2024-07-17: qty 2500

## 2024-07-17 MED ORDER — TORSEMIDE 20 MG PO TABS
40.0000 mg | ORAL_TABLET | Freq: Every day | ORAL | Status: DC
  Administered 2024-07-17 – 2024-07-22 (×6): 40 mg via ORAL
  Filled 2024-07-17 (×6): qty 2

## 2024-07-17 MED ORDER — LIVING WELL WITH DIABETES BOOK
Freq: Once | Status: AC
  Filled 2024-07-17: qty 1

## 2024-07-17 MED ORDER — VANCOMYCIN HCL 750 MG/150ML IV SOLN
750.0000 mg | Freq: Three times a day (TID) | INTRAVENOUS | Status: DC
  Administered 2024-07-17 – 2024-07-18 (×2): 750 mg via INTRAVENOUS
  Filled 2024-07-17 (×3): qty 150

## 2024-07-17 MED ORDER — ONDANSETRON HCL 4 MG/2ML IJ SOLN
4.0000 mg | Freq: Three times a day (TID) | INTRAMUSCULAR | Status: DC | PRN
  Administered 2024-07-17 – 2024-07-21 (×5): 4 mg via INTRAVENOUS
  Filled 2024-07-17 (×5): qty 2

## 2024-07-17 MED ORDER — INSULIN ASPART 100 UNIT/ML IJ SOLN
5.0000 [IU] | Freq: Three times a day (TID) | INTRAMUSCULAR | Status: DC
  Administered 2024-07-17: 5 [IU] via SUBCUTANEOUS

## 2024-07-17 NOTE — Progress Notes (Addendum)
 Pharmacy Antibiotic Note  Carolyn Zimmerman is a 38 y.o. female admitted on 07/15/2024 with cellulitis.  Pharmacy has been consulted for vancomycin  dosing. Cr ~1, TBW ~134kg. Blood cultures with MRSE in 1/4 (likely contaminant).  Plan: Vancomycin  2500mg  IV x1 then 750mg  IV q8h - est AUC 496 Follow Cr, vancomycin  levels at Css  Height: 5' 7.5 (171.5 cm) Weight: 134.6 kg (296 lb 11.8 oz) IBW/kg (Calculated) : 62.75  Temp (24hrs), Avg:98.6 F (37 C), Min:98.2 F (36.8 C), Max:99.2 F (37.3 C)  Recent Labs  Lab 07/15/24 1803 07/16/24 1508 07/17/24 0301  WBC 22.9* 17.6* 12.9*  CREATININE 0.90 1.09* 0.98    Estimated Creatinine Clearance: 114.6 mL/min (by C-G formula based on SCr of 0.98 mg/dL).    Allergies  Allergen Reactions   Prochlorperazine  Anxiety    Panic attacks   Contrast Media [Iodinated Contrast Media] Hives and Swelling     Ozell Jamaica, PharmD, BCPS, [facility] Clinical Pharmacist [phone] Please check AMION for all [facility] Pharmacy numbers 07/17/2024

## 2024-07-17 NOTE — Inpatient Diabetes Management (Addendum)
 Inpatient Diabetes Program Recommendations  AACE/ADA: New Consensus Statement on Inpatient Glycemic Control (2015)  Target Ranges:  Prepandial:   less than 140 mg/dL      Peak postprandial:   less than 180 mg/dL (1-2 hours)      Critically ill patients:  140 - 180 mg/dL   Lab Results  Component Value Date   GLUCAP 229 (H) 07/17/2024   HGBA1C 13.5 (H) 07/16/2024    Latest Reference Range & Units 10/12/23 09:52 10/29/23 11:48 07/16/24 15:08  Hemoglobin A1C 4.8 - 5.6 % 11.7 (H) 11.3 (H) 13.5 (H)  (H): Data is abnormally high  Latest Reference Range & Units 07/16/24 08:34 07/16/24 11:53 07/16/24 16:39 07/16/24 21:37 07/17/24 06:00  Glucose-Capillary 70 - 99 mg/dL 668 (H) 775 (H) 820 (H) 239 (H) 229 (H)  (H): Data is abnormally high  Diabetes history: Type 2 DM Outpatient Diabetes medications: Lantus  30 units every day, Humalog  16 units TID,  Current orders for Inpatient glycemic control: Semglee  20 units daily Novolog  3 units tid meal coverage Novolog  0-15 units tid , 0-5 units hs correction  Inpatient Diabetes Program Recommendations:   Please consider: -Increase Semglee  to 25 units daily -Increase Novolog  meal coverage to 4 units if eats 50%  Patient no longer takes Metformin  due to GI upset.   Ordered Living Well With Diabetes booklet for patient review and plan to speak with patient. DM coordinator spoke with patient on prior admission February 2025.  Spoke with pt about A1C results 13.5 (average blood glucose 341 over the past 2-3 months) and explained what an A1C is, basic pathophysiology of DM Type 2, basic home care, basic diabetes diet nutrition principles, importance of checking CBGs and maintaining good CBG control to prevent long-term and short-term complications. Reviewed signs and symptoms of hyperglycemia and hypoglycemia and how to treat hypoglycemia at home. Also reviewed blood sugar goals at home.  RNs to provide ongoing basic DM education at bedside with this  patient.  Patient states she checks her CBG occasionally. Patient tries to decrease sugary drinks and food. However, patient eats a lot of brown rice. Explained she can eat small portions of rice (1/3 cup rice = 15 gms=1 serving size). MD ordered application of Dexcom G7 CGM at discharge for patient. Education done regarding application and changing CGM sensor (alternate every 10 days on back of arms), 1 hour warm-up, use of glucometer when alert displays, how to scan CGM for glucose reading and information for PCP. Patient has also been given educational packet regarding use CGM sensor including the 1-800 toll free number for any questions, problems or needs related to the [facility] sensors or reader.    Sensor applied by patient to (L) Arm at 12:00 noon .  Explained that glucose readings will not be available until 1 hour after application.    Thank you, Keyonte Cookston E. Tomorrow Dehaas, RN, MSN, CNS, CDCES  Diabetes Coordinator Inpatient Glycemic Control Team Team Pager [phone] (8am-5pm) 07/17/2024 8:11 AM

## 2024-07-17 NOTE — Progress Notes (Signed)
 Echocardiogram 2D Echocardiogram has been performed.  Juliene JINNY Rucks 07/17/2024, 9:46 AM

## 2024-07-17 NOTE — Progress Notes (Addendum)
 PROGRESS NOTE  Carolyn Zimmerman FMW:[id number] DOB: [birth year]   PCP: Leonarda Roxan BROCKS, NP  Patient is from: Home.  Independently ambulates at baseline.  DOA: 07/15/2024 LOS: 0  Chief complaints Chief Complaint  Patient presents with   Cough     Brief Narrative / Interim history: 38 year old F with PMH of HFmrEF, CVA, IDDM-2, HTN and morbid obesity presented to [facility] with generalized body pain including legs, back and stomach as well as some chills and chest congestion.  Initially, admission requested for possible acute CHF.  She was given Lasix , ibuprofen  and ceftriaxone  in ED.   On arrival to [facility], patient with significant RLE cellulitis.  Continued on ceftriaxone .   Blood culture with MRSE in 1 out of 4 bottles.  TTE without significant finding.  LE venous Doppler pending.  Right lower extremity CT ordered.  Vancomycin  added  Subjective: Seen and examined earlier this morning.  No major events overnight or this morning.  Continues to endorse significant pain in her legs, more on the right.  She also reports numbness and tingling in the left leg/foot.  Denies chest pain or shortness of breath.   Assessment and plan: Sepsis due to RLE cellulitis: Patient has RLE erythema with increased warmth to touch consistent with cellulitis.  A1c 13.5%.  CK normal.  Leukocytosis improved with ceftriaxone  continues to endorse significant pain in RLE.  Blood culture with MRSE in 1 out of 4 bottles likely contaminant. -Continue IV ceftriaxone  -Add IV vancomycin  -CT right tibia/fibula.  Without contrast due to contrast allergy.   Bilateral lower extremity edema/pain: Has BLE edema.  Unclear how much of this is acute.  She is on amlodipine  10 mg daily which could contribute.  Seems like she also has neuropathic pain from poorly controlled diabetes. -Hold amlodipine . -Antibiotics as above. -Resume home torsemide  and Aldactone . -Start low-dose gabapentin . -LE venous Doppler and CT as  above   Chest congestion: No history of asthma or COPD.  She has no URI symptoms.  proBNP slightly elevated.  Received IV Lasix  in ED.  Creatinine slightly up probably due to NSAID.  TTE reassuring. - Check full RVP   Chronic HFimpEF: TTE normal (LVEF of 40 to 45% GH G1DD in 10/2023).  She has BLE edema.  proBNP slightly elevated.  Inconsistently uses torsemide  at home.  -Resume home torsemide  and Aldactone . -Continue home Coreg  -Strict intake and output, daily weight, renal functions and electrolyte   Poorly controlled IDDM-2 with hyperglycemia: A1c 13.5%.  Reports using Lantus  30 units at bedtime and Humalog  16 units 3 times daily before meals. Recent Labs  Lab 07/16/24 1153 07/16/24 1639 07/16/24 2137 07/17/24 0600 07/17/24 1138  GLUCAP 224* 179* 239* 229* 328*  - Increase Semglee  from 20 to 25 units daily. -Increase NovoLog  from 3 to 5 units 3 times daily with meals. -Increase SSI from moderate resistant scale -Appreciate help by diabetic coordinator. -Further adjustment as appropriate   Elevated troponin: Mild without significant delta.  Patient has no chest pain.  Likely demand ischemia.  Recent coronary CTA nondiagnostic.  TTE reassuring. - Continue home Coreg , started on aspirin .   History of CVA: No focal neurodeficit. - Continue started on aspirin .   Essential hypertension: BP was elevated but improved.  On amlodipine , Coreg ,, Aldactone , torsemide  - Continue home Coreg , torsemide  and Aldactone . - Hold amlodipine  given BLE edema   Hepatomegaly: Noted on CT.  Fatty liver? -Outpatient follow-up with GI   Bilateral inguinal lymphadenopathy: Likely reactive from cellulitis. - Consider repeat  CT once cellulitis fully treated  Enlarged thyroid gland: Low TSH but free T4 suggesting euthyroid sick syndrome. -Recheck thyroid panel in 4 to 6 weeks. -Nonemergent thyroid ultrasound outpatient.   Hypokalemia -Monitor replenish K and Mg as appropriate   Morbid obesity Body  mass index is 45.79 kg/m. - Encourage lifestyle change to lose weight         DVT prophylaxis:  On subcu Lovenox .  Code Status: Full code Family Communication: None at the bedside Level of care: Telemetry Status is: Inpatient Remains inpatient appropriate because: Sepsis due to cellulitis   Final disposition: Likely home once medically stable   55 minutes with more than 50% spent in reviewing records, counseling patient/family and coordinating care.  Consultants:  None  Procedures: None  Microbiology summarized: Blood culture with Staph epidermidis in 1 out of 4 bottles Urine culture with 10,000 colonies of Streptococcus agalactiae  Objective: Vitals:   07/17/24 0051 07/17/24 0422 07/17/24 0855 07/17/24 1140  BP: (!) 145/78 (!) 156/79 (!) 159/95 (!) 152/78  Pulse: 84 85 88 81  Resp: 20 18 17 18   Temp: 98.5 F (36.9 C) 98.5 F (36.9 C)  98 F (36.7 C)  TempSrc: Oral Oral  Oral  SpO2: 99% 99% 99% 100%  Weight:      Height:        Examination:  GENERAL: No acute distress.  Appears well.  HEENT: MMM.  Vision and hearing grossly intact.  NECK: Supple.  No apparent JVD.  RESP:  No IWOB. Good air movement bilaterally. CVS:  RRR. Heart sounds normal.  ABD/GI/GU: Bowel sounds present. Soft. Non tender.  No CVA tenderness. MSK/EXT:  Moves extremities.  1+ BLE edema.  RLE erythema to her knee.  SKIN: RLE erythema to below her knee.  Increased warmth to touch in RLE.  Small cracks over lateral aspect of right heel. NEURO: Awake, alert and oriented appropriately.  No gross deficit.  PSYCH: Calm. Normal affect.    Sch Meds:  Scheduled Meds:  aspirin   81 mg Oral Daily   carvedilol   25 mg Oral BID WC   enoxaparin  (LOVENOX ) injection  65 mg Subcutaneous Q24H   gabapentin   300 mg Oral BID   insulin  aspart  0-15 Units Subcutaneous TID WC   insulin  aspart  0-5 Units Subcutaneous QHS   insulin  aspart  5 Units Subcutaneous TID WC   insulin  glargine-yfgn  25 Units  Subcutaneous Daily   rosuvastatin   20 mg Oral Daily   spironolactone   25 mg Oral Daily   torsemide   40 mg Oral Daily   Continuous Infusions:  cefTRIAXone  (ROCEPHIN )  IV 2 g (07/16/24 1736)   vancomycin      PRN Meds:.acetaminophen  **OR** acetaminophen , HYDROmorphone  (DILAUDID ) injection, oxyCODONE , polyethylene glycol, senna-docusate  Antimicrobials: Anti-infectives (From admission, onward)    Start     Dose/Rate Route Frequency Ordered Stop   07/17/24 1215  vancomycin  (VANCOREADY) IVPB 750 mg/150 mL        750 mg 150 mL/hr over 60 Minutes Intravenous Every 8 hours 07/17/24 1119     07/16/24 1730  cefTRIAXone  (ROCEPHIN ) 2 g in sodium chloride  0.9 % 100 mL IVPB        2 g 200 mL/hr over 30 Minutes Intravenous Every 24 hours 07/16/24 1634     07/15/24 2130  cefTRIAXone  (ROCEPHIN ) 2 g in sodium chloride  0.9 % 100 mL IVPB        2 g 200 mL/hr over 30 Minutes Intravenous  Once 07/15/24 2129 07/16/24 0024  I have personally reviewed the following labs and images: CBC: Recent Labs  Lab 07/15/24 1803 07/16/24 1508 07/17/24 0301  WBC 22.9* 17.6* 12.9*  NEUTROABS 20.3*  --   --   HGB 11.0* 10.0* 9.5*  HCT 32.7* 31.2* 28.8*  MCV 79.4* 80.4 80.4  PLT 349 343 306   BMP &GFR Recent Labs  Lab 07/15/24 1803 07/16/24 1508 07/17/24 0301  NA 133* 133* 133*  K 3.4* 3.0* 3.7  CL 98 93* 100  CO2 25 25 23   GLUCOSE 338* 135* 248*  BUN 8 9 10   CREATININE 0.90 1.09* 0.98  CALCIUM  8.9 7.8* 7.8*  MG  --  1.6* 1.7   Estimated Creatinine Clearance: 114.6 mL/min (by C-G formula based on SCr of 0.98 mg/dL). Liver & Pancreas: Recent Labs  Lab 07/15/24 1803 07/16/24 1508 07/17/24 0301  AST 19 19 16   ALT 13 15 16   ALKPHOS 94 64 66  BILITOT 0.5 0.4 0.2  PROT 6.7 5.9* 5.6*  ALBUMIN 3.2* 1.9* 1.8*   No results for input(s): LIPASE, AMYLASE in the last 168 hours. No results for input(s): AMMONIA in the last 168 hours. Diabetic: Recent Labs    07/16/24 1508  HGBA1C  13.5*   Recent Labs  Lab 07/16/24 1153 07/16/24 1639 07/16/24 2137 07/17/24 0600 07/17/24 1138  GLUCAP 224* 179* 239* 229* 328*   Cardiac Enzymes: Recent Labs  Lab 07/16/24 1508  CKTOTAL 215   Recent Labs    02/01/24 2319 07/15/24 1803  PROBNP 353.0* 1,386.0*   Coagulation Profile: No results for input(s): INR, PROTIME in the last 168 hours. Thyroid Function Tests: Recent Labs    07/16/24 1756 07/17/24 0821  TSH 0.298*  --   FREET4  --  0.85   Lipid Profile: No results for input(s): CHOL, HDL, LDLCALC, TRIG, CHOLHDL, LDLDIRECT in the last 72 hours. Anemia Panel: No results for input(s): VITAMINB12, FOLATE, FERRITIN, TIBC, IRON, RETICCTPCT in the last 72 hours. Urine analysis:    Component Value Date/Time   COLORURINE YELLOW 07/15/2024 1857   APPEARANCEUR CLEAR 07/15/2024 1857   LABSPEC 1.007 07/15/2024 1857   PHURINE 6.5 07/15/2024 1857   GLUCOSEU 500 (A) 07/15/2024 1857   HGBUR SMALL (A) 07/15/2024 1857   BILIRUBINUR NEGATIVE 07/15/2024 1857   BILIRUBINUR Negative 05/14/2024 1205   KETONESUR NEGATIVE 07/15/2024 1857   PROTEINUR 100 (A) 07/15/2024 1857   UROBILINOGEN negative (A) 05/14/2024 1205   UROBILINOGEN 1.0 01/19/2012 0015   NITRITE NEGATIVE 07/15/2024 1857   LEUKOCYTESUR SMALL (A) 07/15/2024 1857   Sepsis Labs: Invalid input(s): PROCALCITONIN, LACTICIDVEN  Microbiology: Recent Results (from the past 240 hours)  Blood culture (routine x 2)     Status: None (Preliminary result)   Collection Time: 07/15/24  1:24 AM   Specimen: BLOOD LEFT HAND  Result Value Ref Range Status   Specimen Description   Final    BLOOD LEFT HAND Performed at [facility], [address], [place], [place] [address]    Special Requests   Final    BOTTLES DRAWN AEROBIC AND ANAEROBIC Blood Culture adequate volume Performed at [facility], [address] [place], [place] [address]    Culture    Final    NO GROWTH < 24 HOURS Performed at [facility], [address], [place], [place] [address]    Report Status PENDING  Incomplete  Resp panel by RT-PCR (RSV, Flu A&B, Covid) Anterior Nasal Swab     Status: None   Collection Time: 07/15/24  2:59 PM   Specimen: Anterior Nasal Swab  Result Value Ref Range Status   SARS Coronavirus 2 by RT PCR NEGATIVE NEGATIVE Final    Comment: (NOTE) SARS-CoV-2 target nucleic acids are NOT DETECTED.  The SARS-CoV-2 RNA is generally detectable in upper respiratory specimens during the acute phase of infection. The lowest concentration of SARS-CoV-2 viral copies this assay can detect is 138 copies/mL. A negative result does not preclude SARS-Cov-2 infection and should not be used as the sole basis for treatment or other patient management decisions. A negative result may occur with  improper specimen collection/handling, submission of specimen other than nasopharyngeal swab, presence of viral mutation(s) within the areas targeted by this assay, and inadequate number of viral copies(<138 copies/mL). A negative result must be combined with clinical observations, patient history, and epidemiological information. The expected result is Negative.  Fact Sheet for Patients:  bloggercourse.com  Fact Sheet for Healthcare Providers:  seriousbroker.it  This test is no t yet approved or cleared by the [place]  FDA and  has been authorized for detection and/or diagnosis of SARS-CoV-2 by FDA under an Emergency Use Authorization (EUA). This EUA will remain  in effect (meaning this test can be used) for the duration of the COVID-19 declaration under Section 564(b)(1) of the Act, 21 U.S.C.section 360bbb-3(b)(1), unless the authorization is terminated  or revoked sooner.       Influenza A by PCR NEGATIVE NEGATIVE Final   Influenza B by PCR NEGATIVE NEGATIVE Final    Comment: (NOTE) The Xpert  Xpress SARS-CoV-2/FLU/RSV plus assay is intended as an aid in the diagnosis of influenza from Nasopharyngeal swab specimens and should not be used as a sole basis for treatment. Nasal washings and aspirates are unacceptable for Xpert Xpress SARS-CoV-2/FLU/RSV testing.  Fact Sheet for Patients: bloggercourse.com  Fact Sheet for Healthcare Providers: seriousbroker.it  This test is not yet approved or cleared by the [place]  FDA and has been authorized for detection and/or diagnosis of SARS-CoV-2 by FDA under an Emergency Use Authorization (EUA). This EUA will remain in effect (meaning this test can be used) for the duration of the COVID-19 declaration under Section 564(b)(1) of the Act, 21 U.S.C. section 360bbb-3(b)(1), unless the authorization is terminated or revoked.     Resp Syncytial Virus by PCR NEGATIVE NEGATIVE Final    Comment: (NOTE) Fact Sheet for Patients: bloggercourse.com  Fact Sheet for Healthcare Providers: seriousbroker.it  This test is not yet approved or cleared by the [place]  FDA and has been authorized for detection and/or diagnosis of SARS-CoV-2 by FDA under an Emergency Use Authorization (EUA). This EUA will remain in effect (meaning this test can be used) for the duration of the COVID-19 declaration under Section 564(b)(1) of the Act, 21 U.S.C. section 360bbb-3(b)(1), unless the authorization is terminated or revoked.  Performed at [facility], [address], [place], [place] [address]   Urine Culture     Status: Abnormal   Collection Time: 07/15/24  9:30 PM   Specimen: Urine, Clean Catch  Result Value Ref Range Status   Specimen Description   Final    URINE, CLEAN CATCH Performed at [facility], [address] [place], [place] [address]    Special Requests   Final    NONE Performed at [facility], [address], [place], [place] [address]    Culture (A)  Final    10,000 COLONIES/mL GROUP B STREP(S.AGALACTIAE)ISOLATED TESTING AGAINST S. AGALACTIAE NOT ROUTINELY PERFORMED DUE TO PREDICTABILITY OF  AMP/PEN/VAN SUSCEPTIBILITY. Performed at [facility], [address], [place], [place] [address]    Report Status 07/17/2024 FINAL  Final  Blood culture (routine x 2)     Status: None (Preliminary result)   Collection Time: 07/15/24  9:58 PM   Specimen: BLOOD  Result Value Ref Range Status   Specimen Description   Final    BLOOD RIGHT ANTECUBITAL Performed at [facility], [address], [place], [place] [address]    Special Requests   Final    BOTTLES DRAWN AEROBIC AND ANAEROBIC Blood Culture adequate volume Performed at [facility], [address], [place], [place] [address]    Culture  Setup Time   Final    GRAM POSITIVE COCCI IN CLUSTERS AEROBIC BOTTLE ONLY CRITICAL RESULT CALLED TO, READ BACK BY AND VERIFIED WITH: MAYA FELICIANO ORN [phone] FCP Performed at [facility], [address] [place], [place] [address]    Culture GRAM POSITIVE COCCI  Final   Report Status PENDING  Incomplete  Blood Culture ID Panel (Reflexed)     Status: Abnormal   Collection Time: 07/15/24  9:58 PM  Result Value Ref Range Status   Enterococcus faecalis NOT DETECTED NOT DETECTED Final   Enterococcus Faecium NOT DETECTED NOT DETECTED Final   Listeria monocytogenes NOT DETECTED NOT DETECTED Final   Staphylococcus species DETECTED (A) NOT DETECTED Final    Comment: CRITICAL RESULT CALLED TO, READ BACK BY AND VERIFIED WITH: MAYA FELICIANO ORN [phone] [id number] FCP    Staphylococcus aureus (BCID) NOT DETECTED NOT DETECTED Final   Staphylococcus epidermidis DETECTED (A) NOT DETECTED Final    Comment: Methicillin (oxacillin) resistant coagulase negative staphylococcus. Possible blood culture contaminant (unless isolated from more  than one blood culture draw or clinical case suggests pathogenicity). No antibiotic treatment is indicated for blood  culture contaminants. CRITICAL RESULT CALLED TO, READ BACK BY AND VERIFIED WITH: PHARMD BLAKE W [id number] FCP    Staphylococcus lugdunensis NOT DETECTED NOT DETECTED Final   Streptococcus species NOT DETECTED NOT DETECTED Final   Streptococcus agalactiae NOT DETECTED NOT DETECTED Final   Streptococcus pneumoniae NOT DETECTED NOT DETECTED Final   Streptococcus pyogenes NOT DETECTED NOT DETECTED Final   A.calcoaceticus-baumannii NOT DETECTED NOT DETECTED Final   Bacteroides fragilis NOT DETECTED NOT DETECTED Final   Enterobacterales NOT DETECTED NOT DETECTED Final   Enterobacter cloacae complex NOT DETECTED NOT DETECTED Final   Escherichia coli NOT DETECTED NOT DETECTED Final   Klebsiella aerogenes NOT DETECTED NOT DETECTED Final   Klebsiella oxytoca NOT DETECTED NOT DETECTED Final   Klebsiella pneumoniae NOT DETECTED NOT DETECTED Final   Proteus species NOT DETECTED NOT DETECTED Final   Salmonella species NOT DETECTED NOT DETECTED Final   Serratia marcescens NOT DETECTED NOT DETECTED Final   Haemophilus influenzae NOT DETECTED NOT DETECTED Final   Neisseria meningitidis NOT DETECTED NOT DETECTED Final   Pseudomonas aeruginosa NOT DETECTED NOT DETECTED Final   Stenotrophomonas maltophilia NOT DETECTED NOT DETECTED Final   Candida albicans NOT DETECTED NOT DETECTED Final   Candida auris NOT DETECTED NOT DETECTED Final   Candida glabrata NOT DETECTED NOT DETECTED Final   Candida krusei NOT DETECTED NOT DETECTED Final   Candida parapsilosis NOT DETECTED NOT DETECTED Final   Candida tropicalis NOT DETECTED NOT DETECTED Final   Cryptococcus neoformans/gattii NOT DETECTED NOT DETECTED Final   Methicillin resistance mecA/C DETECTED (A) NOT DETECTED Final    Comment: CRITICAL RESULT CALLED TO, READ BACK BY  AND VERIFIED WITH: MAYA FELICIANO ORN [phone] FCP Performed at [facility], [address] [place], [place] [address]     Radiology Studies: ECHOCARDIOGRAM COMPLETE Result Date: 07/17/2024    ECHOCARDIOGRAM REPORT   Patient Name:   YASMIN BRONAUGH Date of Exam: 07/17/2024 Medical Rec #:  [id number]    Height:       67.5 in Accession #:    [id number]   Weight:       296.7 lb Date of Birth:  12-Sep-1986    BSA:          2.405 m Patient Age:    38 years     BP:           159/95 mmHg Patient Gender: F            HR:           85 bpm. Exam Location:  Inpatient Procedure: 2D Echo, Cardiac Doppler and Color Doppler (Both Spectral and Color            Flow Doppler were utilized during procedure). Indications:    Other abnormalities of the heart  History:        Patient has prior history of Echocardiogram examinations, most                 recent 11/01/2023. CHF; Risk Factors:Diabetes, Dyslipidemia,                 Hypertension and Former Smoker.  Sonographer:    Juliene Rucks Referring Phys: [id number] MIGNON DASEN Shaianne Nucci  Sonographer Comments: Patient is obese. Image acquisition challenging due to patient body habitus. IMPRESSIONS  1. Left ventricular ejection fraction, by estimation, is 60 to 65%. The left ventricle has normal function. The left ventricle has no regional wall motion abnormalities. The left ventricular internal cavity size was severely dilated. There is mild concentric left ventricular hypertrophy. Left ventricular diastolic parameters were normal.  2. Right ventricular systolic function is normal. The right ventricular size is normal. Tricuspid regurgitation signal is inadequate for assessing PA pressure.  3. Left atrial size was mildly dilated.  4. The mitral valve is normal in structure. Trivial mitral valve regurgitation. No evidence of mitral stenosis.  5. The aortic valve is normal in structure. Aortic valve regurgitation is not visualized. No aortic stenosis is present.  6. The inferior vena cava is normal in size with greater than 50% respiratory variability,  suggesting right atrial pressure of 3 mmHg.  7. A small pericardial effusion is present. The pericardial effusion is circumferential. There is no evidence of cardiac tamponade. FINDINGS  Left Ventricle: Left ventricular ejection fraction, by estimation, is 60 to 65%. The left ventricle has normal function. The left ventricle has no regional wall motion abnormalities. The left ventricular internal cavity size was severely dilated. There is mild concentric left ventricular hypertrophy. Left ventricular diastolic parameters were normal. Normal left ventricular filling pressure. Right Ventricle: The right ventricular size is normal. No increase in right ventricular wall thickness. Right ventricular systolic function is normal. Tricuspid regurgitation signal is inadequate for assessing PA pressure. Left Atrium: Left atrial size was mildly dilated. Right Atrium: Right atrial size was normal in size. Pericardium: A small pericardial effusion is present. The pericardial effusion is circumferential. There is no evidence of cardiac tamponade. Mitral Valve: The mitral valve is normal in structure. Trivial mitral valve regurgitation. No evidence of mitral valve stenosis. Tricuspid Valve: The tricuspid valve is normal in structure. Tricuspid valve regurgitation  is not demonstrated. No evidence of tricuspid stenosis. Aortic Valve: The aortic valve is normal in structure. Aortic valve regurgitation is not visualized. No aortic stenosis is present. Aortic valve mean gradient measures 7.0 mmHg. Aortic valve peak gradient measures 13.5 mmHg. Aortic valve area, by VTI measures 2.78 cm. Pulmonic Valve: The pulmonic valve was normal in structure. Pulmonic valve regurgitation is not visualized. No evidence of pulmonic stenosis. Aorta: The aortic root is normal in size and structure. Venous: The inferior vena cava is normal in size with greater than 50% respiratory variability, suggesting right atrial pressure of 3 mmHg. IAS/Shunts: No  atrial level shunt detected by color flow Doppler.  LEFT VENTRICLE PLAX 2D LVIDd:         6.20 cm   Diastology LVIDs:         4.10 cm   LV e' medial:    9.03 cm/s LV PW:         1.50 cm   LV E/e' medial:  10.5 LV IVS:        1.20 cm   LV e' lateral:   6.85 cm/s LVOT diam:     2.10 cm   LV E/e' lateral: 13.8 LV SV:         91 LV SV Index:   38 LVOT Area:     3.46 cm LV IVRT:       106 msec  RIGHT VENTRICLE RV Basal diam:  3.90 cm RV Mid diam:    3.60 cm RV S prime:     19.40 cm/s TAPSE (M-mode): 3.2 cm LEFT ATRIUM             Index        RIGHT ATRIUM           Index LA Vol (A2C):   96.8 ml 40.26 ml/m  RA Area:     15.20 cm LA Vol (A4C):   72.8 ml 30.27 ml/m  RA Volume:   35.90 ml  14.93 ml/m LA Biplane Vol: 86.4 ml 35.93 ml/m  AORTIC VALVE AV Area (Vmax):    2.60 cm AV Area (Vmean):   2.54 cm AV Area (VTI):     2.78 cm AV Vmax:           184.00 cm/s AV Vmean:          127.000 cm/s AV VTI:            0.328 m AV Peak Grad:      13.5 mmHg AV Mean Grad:      7.0 mmHg LVOT Vmax:         138.00 cm/s LVOT Vmean:        93.200 cm/s LVOT VTI:          0.263 m LVOT/AV VTI ratio: 0.80  AORTA Ao Root diam: 3.00 cm Ao Asc diam:  3.00 cm MITRAL VALVE MV Area (PHT): 3.83 cm    SHUNTS MV Decel Time: 198 msec    Systemic VTI:  0.26 m MR Peak grad: 33.9 mmHg    Systemic Diam: 2.10 cm MR Vmax:      291.00 cm/s MV E velocity: 94.70 cm/s MV A velocity: 99.40 cm/s MV E/A ratio:  0.95 Wilbert Bihari MD Electronically signed by Wilbert Bihari MD Signature Date/Time: 07/17/2024/9:58:47 AM    Final       Shon Mansouri T. Kameisha Malicki [facility]  If 7PM-7AM, please contact night-coverage [email or web address] 07/17/2024, 12:10 PM

## 2024-07-17 NOTE — Progress Notes (Signed)
 PHARMACY - PHYSICIAN COMMUNICATION CRITICAL VALUE ALERT - BLOOD CULTURE IDENTIFICATION (BCID)  Carolyn Zimmerman is an 38 y.o. female who presented to [facility] on 07/15/2024 with a chief complaint of generalized body pain and congestion.   Assessment:  GPC in clusters in 1/4 bottles (aerobic), BCID = Staphylococcus epidermidis (MRSE)  Current infectious workup is for RLE cellulitis.  - Afebrile, WBC 12.9, TTE without significant finding  Name of physician (or Provider) Contacted: Dr. Kathrin  Current antibiotics: ceftriaxone , vancomycin    Changes to prescribed antibiotics recommended:  - Initial recommendation to provider was to continue course of ceftriaxone  as 1/4 bottles of Staph epi may indicate contaminant - Provider wants ceftriaxone  + vancomycin  (which is reasonable)   Results for orders placed or performed during the hospital encounter of 07/15/24  Blood Culture ID Panel (Reflexed) (Collected: 07/15/2024  9:58 PM)  Result Value Ref Range   Enterococcus faecalis NOT DETECTED NOT DETECTED   Enterococcus Faecium NOT DETECTED NOT DETECTED   Listeria monocytogenes NOT DETECTED NOT DETECTED   Staphylococcus species DETECTED (A) NOT DETECTED   Staphylococcus aureus (BCID) NOT DETECTED NOT DETECTED   Staphylococcus epidermidis DETECTED (A) NOT DETECTED   Staphylococcus lugdunensis NOT DETECTED NOT DETECTED   Streptococcus species NOT DETECTED NOT DETECTED   Streptococcus agalactiae NOT DETECTED NOT DETECTED   Streptococcus pneumoniae NOT DETECTED NOT DETECTED   Streptococcus pyogenes NOT DETECTED NOT DETECTED   A.calcoaceticus-baumannii NOT DETECTED NOT DETECTED   Bacteroides fragilis NOT DETECTED NOT DETECTED   Enterobacterales NOT DETECTED NOT DETECTED   Enterobacter cloacae complex NOT DETECTED NOT DETECTED   Escherichia coli NOT DETECTED NOT DETECTED   Klebsiella aerogenes NOT DETECTED NOT DETECTED   Klebsiella oxytoca NOT DETECTED NOT DETECTED   Klebsiella pneumoniae NOT  DETECTED NOT DETECTED   Proteus species NOT DETECTED NOT DETECTED   Salmonella species NOT DETECTED NOT DETECTED   Serratia marcescens NOT DETECTED NOT DETECTED   Haemophilus influenzae NOT DETECTED NOT DETECTED   Neisseria meningitidis NOT DETECTED NOT DETECTED   Pseudomonas aeruginosa NOT DETECTED NOT DETECTED   Stenotrophomonas maltophilia NOT DETECTED NOT DETECTED   Candida albicans NOT DETECTED NOT DETECTED   Candida auris NOT DETECTED NOT DETECTED   Candida glabrata NOT DETECTED NOT DETECTED   Candida krusei NOT DETECTED NOT DETECTED   Candida parapsilosis NOT DETECTED NOT DETECTED   Candida tropicalis NOT DETECTED NOT DETECTED   Cryptococcus neoformans/gattii NOT DETECTED NOT DETECTED   Methicillin resistance mecA/C DETECTED (A) NOT DETECTED    Feliciano Close, PharmD PGY2 Infectious Diseases Pharmacy Resident  07/17/2024 12:38 PM

## 2024-07-17 NOTE — TOC CM/SW Note (Signed)
 Transition of Care [facility]) - Inpatient Brief Assessment   Patient Details  Name: Carolyn Zimmerman MRN: [id number] Date of Birth: January 09, 1986  [facility] CM/SW Contact:    Waddell Barnie Rama, RN Phone Number: 07/17/2024, 3:16 PM   Clinical Narrative: From home with Mom and kids, has PCP and insurance on file, states has no HH services in place at this time , has rollator at home.  States family member (mom) will transport them home at [employer] and family is support system, states gets medications from [facility] [address].  Pta self ambulatory with rollator.   There are no ICM needs identified  at this time.  Please place consult for ICM needs.     Transition of Care Asessment: Insurance and Status: Insurance coverage has been reviewed Patient has primary care physician: Yes Home environment has been reviewed: home with mom and kids Prior level of function:: ambulatory , uses rollator for long distance Prior/Current Home Services: Current home services (rollator) Social Drivers of Health Review: SDOH reviewed no interventions necessary Readmission risk has been reviewed: Yes Transition of care needs: no transition of care needs at this time

## 2024-07-18 DIAGNOSIS — A419 Sepsis, unspecified organism: Secondary | ICD-10-CM | POA: Diagnosis not present

## 2024-07-18 DIAGNOSIS — L0390 Cellulitis, unspecified: Secondary | ICD-10-CM | POA: Diagnosis not present

## 2024-07-18 DIAGNOSIS — L03115 Cellulitis of right lower limb: Secondary | ICD-10-CM | POA: Diagnosis not present

## 2024-07-18 LAB — GLUCOSE, CAPILLARY
Glucose-Capillary: 282 mg/dL — ABNORMAL HIGH (ref 70–99)
Glucose-Capillary: 314 mg/dL — ABNORMAL HIGH (ref 70–99)
Glucose-Capillary: 234 mg/dL — ABNORMAL HIGH (ref 70–99)
Glucose-Capillary: 276 mg/dL — ABNORMAL HIGH (ref 70–99)

## 2024-07-18 LAB — CBC
HCT: 30.7 % — ABNORMAL LOW (ref 36.0–46.0)
Hemoglobin: 9.6 g/dL — ABNORMAL LOW (ref 12.0–15.0)
MCH: 26.1 pg (ref 26.0–34.0)
MCHC: 31.3 g/dL (ref 30.0–36.0)
MCV: 83.4 fL (ref 80.0–100.0)
Platelets: 305 K/uL (ref 150–400)
RBC: 3.68 MIL/uL — ABNORMAL LOW (ref 3.87–5.11)
RDW: 13.8 % (ref 11.5–15.5)
WBC: 8.8 K/uL (ref 4.0–10.5)
nRBC: 0 % (ref 0.0–0.2)

## 2024-07-18 LAB — RENAL FUNCTION PANEL
Albumin: 1.8 g/dL — ABNORMAL LOW (ref 3.5–5.0)
Anion gap: 12 (ref 5–15)
BUN: 12 mg/dL (ref 6–20)
CO2: 24 mmol/L (ref 22–32)
Calcium: 8.1 mg/dL — ABNORMAL LOW (ref 8.9–10.3)
Chloride: 101 mmol/L (ref 98–111)
Creatinine, Ser: 1.01 mg/dL — ABNORMAL HIGH (ref 0.44–1.00)
GFR, Estimated: >60 mL/min (ref >60)
Glucose, Bld: 313 mg/dL — ABNORMAL HIGH (ref 70–99)
Phosphorus: 3.5 mg/dL (ref 2.5–4.6)
Potassium: 3.9 mmol/L (ref 3.5–5.1)
Sodium: 137 mmol/L (ref 135–145)

## 2024-07-18 LAB — CULTURE, BLOOD (ROUTINE X 2): Special Requests: ADEQUATE

## 2024-07-18 LAB — MAGNESIUM: Magnesium: 2.4 mg/dL (ref 1.7–2.4)

## 2024-07-18 MED ORDER — INSULIN ASPART 100 UNIT/ML IJ SOLN
7.0000 [IU] | Freq: Three times a day (TID) | INTRAMUSCULAR | Status: DC
  Administered 2024-07-18 – 2024-07-19 (×3): 7 [IU] via SUBCUTANEOUS
  Filled 2024-07-18: qty 7

## 2024-07-18 MED ORDER — LINEZOLID 600 MG PO TABS
600.0000 mg | ORAL_TABLET | Freq: Two times a day (BID) | ORAL | Status: DC
Start: 2024-07-18 — End: 2024-07-24
  Administered 2024-07-18 – 2024-07-21 (×7): 600 mg via ORAL
  Filled 2024-07-18 (×7): qty 1

## 2024-07-18 MED ORDER — INSULIN GLARGINE-YFGN 100 UNIT/ML ~~LOC~~ SOLN
35.0000 [IU] | Freq: Every day | SUBCUTANEOUS | Status: DC
  Administered 2024-07-18: 35 [IU] via SUBCUTANEOUS
  Filled 2024-07-18 (×2): qty 0.35

## 2024-07-18 MED ORDER — AMOXICILLIN-POT CLAVULANATE 875-125 MG PO TABS
1.0000 | ORAL_TABLET | Freq: Two times a day (BID) | ORAL | Status: DC
  Administered 2024-07-18 – 2024-07-20 (×5): 1 via ORAL
  Filled 2024-07-18 (×6): qty 1

## 2024-07-18 NOTE — Discharge Instructions (Signed)

## 2024-07-18 NOTE — Progress Notes (Signed)
 PROGRESS NOTE  Carolyn Zimmerman FMW:[id number] DOB: [birth year]   PCP: Leonarda Roxan BROCKS, NP  Patient is from: Home.  Independently ambulates at baseline.  DOA: 07/15/2024 LOS: 1  Chief complaints Chief Complaint  Patient presents with   Cough     Brief Narrative / Interim history: 38 year old F with PMH of HFmrEF, CVA, IDDM-2, HTN and morbid obesity presented to [facility] with generalized body pain including legs, back and stomach as well as some chills and chest congestion.  Initially, admission requested for possible acute CHF.  She was given Lasix , ibuprofen  and ceftriaxone  in ED.   On arrival to [facility], patient with significant RLE cellulitis.  Continued on ceftriaxone .   Blood culture with MRSE in 1 out of 4 bottles.  TTE without significant finding.  LE venous Doppler negative.  Right lower extremity CT suggested cellulitis.  Vancomycin  added.  Cellulitis improved.  De-escalated antibiotics to p.o. Zyvox and Augmentin .   Subjective: Seen and examined earlier this morning.  No major events overnight or this morning.  Some improvement in pain.  Said she slept well after she received pain medication last night.  Redness improved as well.  Denies chest pain or shortness of breath.   Assessment and plan: Sepsis due to RLE cellulitis: Patient has RLE erythema with increased warmth to touch consistent with cellulitis.  A1c 13.5%.  CK normal. Blood culture with MRSE in 1 out of 4 bottles likely contaminant.  Contrast CT suggested cellulitis with no fluid collection or osseous abnormalities. - De-escalate antibiotics to Zyvox and Augmentin  - Pain control and leg elevation -PT eval   Bilateral lower extremity edema/pain: Has BLE edema.  Unclear how much of this is acute.  She is on amlodipine  10 mg daily which could contribute.  Seems like she also has neuropathic pain from poorly controlled diabetes.  Lower extremity venous Doppler negative for DVT. -Continue holding  amlodipine  -Antibiotics as above. -Continue home torsemide  and Aldactone . -Continue low-dose gabapentin .   Chest congestion: No history of asthma or COPD.  She has no URI symptoms.  proBNP slightly elevated.  Received IV Lasix  in ED. TTE reassuring.  RVP negative.   Chronic HFimpEF: TTE normal (LVEF of 40 to 45% GH G1DD in 10/2023).  She has BLE edema.  proBNP slightly elevated.  Inconsistently uses torsemide  at home.  -Continue home torsemide , Aldactone  and Coreg . -Strict intake and output, daily weight, renal functions and electrolyte   Poorly controlled IDDM-2 with hyperglycemia: A1c 13.5%.  Reports using Lantus  30 units at bedtime and Humalog  16 units 3 times daily before meals. Recent Labs  Lab 07/17/24 1138 07/17/24 1520 07/17/24 2103 07/18/24 0633 07/18/24 1108  GLUCAP 328* 225* 265* 282* 314*  -Increase Semglee  from 25 to 35 units daily -Increase NovoLog  from 5 to 7 units 3 times daily with meals -Continue resistant scale insulin  -Appreciate help by diabetic coordinator. -Further adjustment as appropriate   Elevated troponin: Mild without significant delta.  Patient has no chest pain.  Likely demand ischemia.  Recent coronary CTA nondiagnostic.  TTE reassuring. - Continue home Coreg , started on aspirin .   History of CVA: No focal neurodeficit. - Continue started on aspirin .   Essential hypertension: BP was elevated but improved.  On amlodipine , Coreg , Aldactone , torsemide  - Continue home Coreg , torsemide  and Aldactone . - Hold amlodipine  given BLE edema   Hepatomegaly: Noted on CT.  Fatty liver? -Outpatient follow-up with GI   Bilateral inguinal lymphadenopathy: Likely reactive from cellulitis. - Consider repeat CT once cellulitis fully  treated  Enlarged thyroid gland: Low TSH but free T4 suggesting euthyroid sick syndrome. -Recheck thyroid panel in 4 to 6 weeks. -Nonemergent thyroid ultrasound outpatient.   Hypokalemia -Monitor replenish K and Mg as appropriate    Morbid obesity Body mass index is 45.79 kg/m. - Encourage lifestyle change to lose weight         DVT prophylaxis:  On subcu Lovenox .  Code Status: Full code Family Communication: None at the bedside Level of care: Telemetry Status is: Inpatient Remains inpatient appropriate because: Sepsis due to cellulitis   Final disposition: Likely home once medically stable   55 minutes with more than 50% spent in reviewing records, counseling patient/family and coordinating care.  Consultants:  None  Procedures: None  Microbiology summarized: Blood culture with Staph epidermidis in 1 out of 4 bottles Urine culture with 10,000 colonies of Streptococcus agalactiae  Objective: Vitals:   07/17/24 1915 07/18/24 0007 07/18/24 0325 07/18/24 1110  BP: 116/71 (!) 156/79 (!) 153/86 (!) 157/86  Pulse: 78 80  70  Resp: 20 (!) 22 15 18   Temp: 98.7 F (37.1 C) 98.7 F (37.1 C) 98.8 F (37.1 C) 98.4 F (36.9 C)  TempSrc: Oral Oral Oral Oral  SpO2: 98% 99% 99% 100%  Weight:      Height:        Examination:  GENERAL: No acute distress.  Appears well.  HEENT: MMM.  Vision and hearing grossly intact.  NECK: Supple.  No apparent JVD.  RESP:  No IWOB. Good air movement bilaterally. CVS:  RRR. Heart sounds normal.  ABD/GI/GU: Bowel sounds present. Soft. Non tender.  No CVA tenderness. MSK/EXT:  Moves extremities.  1+ BLE edema.  RLE erythema to her knee.  SKIN: RLE erythema to below her knee.  Increased warmth to touch in RLE.  Small cracks over lateral aspect of right heel. NEURO: Awake, alert and oriented appropriately.  No gross deficit.  PSYCH: Calm. Normal affect.    Sch Meds:  Scheduled Meds:  amoxicillin -clavulanate  1 tablet Oral Q12H   aspirin   81 mg Oral Daily   carvedilol   25 mg Oral BID WC   enoxaparin  (LOVENOX ) injection  65 mg Subcutaneous Q24H   gabapentin   300 mg Oral BID   insulin  aspart  0-20 Units Subcutaneous TID WC   insulin  aspart  0-5 Units  Subcutaneous QHS   insulin  aspart  7 Units Subcutaneous TID WC   insulin  glargine-yfgn  35 Units Subcutaneous Daily   linezolid  600 mg Oral Q12H   rosuvastatin   20 mg Oral Daily   spironolactone   25 mg Oral Daily   torsemide   40 mg Oral Daily   Continuous Infusions:   PRN Meds:.acetaminophen  **OR** acetaminophen , HYDROmorphone  (DILAUDID ) injection, ondansetron  (ZOFRAN ) IV, oxyCODONE , polyethylene glycol, senna-docusate  Antimicrobials: Anti-infectives (From admission, onward)    Start     Dose/Rate Route Frequency Ordered Stop   07/18/24 1045  linezolid (ZYVOX) tablet 600 mg        600 mg Oral Every 12 hours 07/18/24 0954     07/18/24 1045  amoxicillin -clavulanate (AUGMENTIN ) 875-125 MG per tablet 1 tablet        1 tablet Oral Every 12 hours 07/18/24 0954     07/17/24 2200  vancomycin  (VANCOREADY) IVPB 750 mg/150 mL  Status:  Discontinued        750 mg 150 mL/hr over 60 Minutes Intravenous Every 8 hours 07/17/24 1222 07/18/24 0954   07/17/24 1315  vancomycin  (VANCOCIN ) 2,500 mg in sodium chloride   0.9 % 500 mL IVPB        2,500 mg 262.5 mL/hr over 120 Minutes Intravenous  Once 07/17/24 1222 07/17/24 1611   07/17/24 1215  vancomycin  (VANCOREADY) IVPB 750 mg/150 mL  Status:  Discontinued        750 mg 150 mL/hr over 60 Minutes Intravenous Every 8 hours 07/17/24 1119 07/17/24 1222   07/16/24 1730  cefTRIAXone  (ROCEPHIN ) 2 g in sodium chloride  0.9 % 100 mL IVPB  Status:  Discontinued        2 g 200 mL/hr over 30 Minutes Intravenous Every 24 hours 07/16/24 1634 07/18/24 0954   07/15/24 2130  cefTRIAXone  (ROCEPHIN ) 2 g in sodium chloride  0.9 % 100 mL IVPB        2 g 200 mL/hr over 30 Minutes Intravenous  Once 07/15/24 2129 07/16/24 0024        I have personally reviewed the following labs and images: CBC: Recent Labs  Lab 07/15/24 1803 07/16/24 1508 07/17/24 0301 07/18/24 0239  WBC 22.9* 17.6* 12.9* 8.8  NEUTROABS 20.3*  --   --   --   HGB 11.0* 10.0* 9.5* 9.6*  HCT  32.7* 31.2* 28.8* 30.7*  MCV 79.4* 80.4 80.4 83.4  PLT 349 343 306 305   BMP &GFR Recent Labs  Lab 07/15/24 1803 07/16/24 1508 07/17/24 0301 07/18/24 0239  NA 133* 133* 133* 137  K 3.4* 3.0* 3.7 3.9  CL 98 93* 100 101  CO2 25 25 23 24   GLUCOSE 338* 135* 248* 313*  BUN 8 9 10 12   CREATININE 0.90 1.09* 0.98 1.01*  CALCIUM  8.9 7.8* 7.8* 8.1*  MG  --  1.6* 1.7 2.4  PHOS  --   --   --  3.5   Estimated Creatinine Clearance: 111.2 mL/min (A) (by C-G formula based on SCr of 1.01 mg/dL (H)). Liver & Pancreas: Recent Labs  Lab 07/15/24 1803 07/16/24 1508 07/17/24 0301 07/18/24 0239  AST 19 19 16   --   ALT 13 15 16   --   ALKPHOS 94 64 66  --   BILITOT 0.5 0.4 0.2  --   PROT 6.7 5.9* 5.6*  --   ALBUMIN 3.2* 1.9* 1.8* 1.8*   No results for input(s): LIPASE, AMYLASE in the last 168 hours. No results for input(s): AMMONIA in the last 168 hours. Diabetic: Recent Labs    07/16/24 1508  HGBA1C 13.5*   Recent Labs  Lab 07/17/24 1138 07/17/24 1520 07/17/24 2103 07/18/24 0633 07/18/24 1108  GLUCAP 328* 225* 265* 282* 314*   Cardiac Enzymes: Recent Labs  Lab 07/16/24 1508  CKTOTAL 215   Recent Labs    02/01/24 2319 07/15/24 1803  PROBNP 353.0* 1,386.0*   Coagulation Profile: No results for input(s): INR, PROTIME in the last 168 hours. Thyroid Function Tests: Recent Labs    07/16/24 1756 07/17/24 0821  TSH 0.298*  --   FREET4  --  0.85   Lipid Profile: No results for input(s): CHOL, HDL, LDLCALC, TRIG, CHOLHDL, LDLDIRECT in the last 72 hours. Anemia Panel: No results for input(s): VITAMINB12, FOLATE, FERRITIN, TIBC, IRON, RETICCTPCT in the last 72 hours. Urine analysis:    Component Value Date/Time   COLORURINE YELLOW 07/15/2024 1857   APPEARANCEUR CLEAR 07/15/2024 1857   LABSPEC 1.007 07/15/2024 1857   PHURINE 6.5 07/15/2024 1857   GLUCOSEU 500 (A) 07/15/2024 1857   HGBUR SMALL (A) 07/15/2024 1857   BILIRUBINUR  NEGATIVE 07/15/2024 1857   BILIRUBINUR Negative 05/14/2024 1205  KETONESUR NEGATIVE 07/15/2024 1857   PROTEINUR 100 (A) 07/15/2024 1857   UROBILINOGEN negative (A) 05/14/2024 1205   UROBILINOGEN 1.0 01/19/2012 0015   NITRITE NEGATIVE 07/15/2024 1857   LEUKOCYTESUR SMALL (A) 07/15/2024 1857   Sepsis Labs: Invalid input(s): PROCALCITONIN, LACTICIDVEN  Microbiology: Recent Results (from the past 240 hours)  Blood culture (routine x 2)     Status: None (Preliminary result)   Collection Time: 07/15/24  1:24 AM   Specimen: BLOOD LEFT HAND  Result Value Ref Range Status   Specimen Description   Final    BLOOD LEFT HAND Performed at [facility], [address], [place], [place] [address]    Special Requests   Final    BOTTLES DRAWN AEROBIC AND ANAEROBIC Blood Culture adequate volume Performed at [facility], [address] [place], [place] [address]    Culture   Final    NO GROWTH 2 DAYS Performed at [facility], [address] [place], [place] [address]    Report Status PENDING  Incomplete  Resp panel by RT-PCR (RSV, Flu A&B, Covid) Anterior Nasal Swab     Status: None   Collection Time: 07/15/24  2:59 PM   Specimen: Anterior Nasal Swab  Result Value Ref Range Status   SARS Coronavirus 2 by RT PCR NEGATIVE NEGATIVE Final    Comment: (NOTE) SARS-CoV-2 target nucleic acids are NOT DETECTED.  The SARS-CoV-2 RNA is generally detectable in upper respiratory specimens during the acute phase of infection. The lowest concentration of SARS-CoV-2 viral copies this assay can detect is 138 copies/mL. A negative result does not preclude SARS-Cov-2 infection and should not be used as the sole basis for treatment or other patient management decisions. A negative result may occur with  improper specimen collection/handling, submission of specimen other than nasopharyngeal swab, presence of viral mutation(s) within the areas targeted by  this assay, and inadequate number of viral copies(<138 copies/mL). A negative result must be combined with clinical observations, patient history, and epidemiological information. The expected result is Negative.  Fact Sheet for Patients:  bloggercourse.com  Fact Sheet for Healthcare Providers:  seriousbroker.it  This test is no t yet approved or cleared by the [place]  FDA and  has been authorized for detection and/or diagnosis of SARS-CoV-2 by FDA under an Emergency Use Authorization (EUA). This EUA will remain  in effect (meaning this test can be used) for the duration of the COVID-19 declaration under Section 564(b)(1) of the Act, 21 U.S.C.section 360bbb-3(b)(1), unless the authorization is terminated  or revoked sooner.       Influenza A by PCR NEGATIVE NEGATIVE Final   Influenza B by PCR NEGATIVE NEGATIVE Final    Comment: (NOTE) The Xpert Xpress SARS-CoV-2/FLU/RSV plus assay is intended as an aid in the diagnosis of influenza from Nasopharyngeal swab specimens and should not be used as a sole basis for treatment. Nasal washings and aspirates are unacceptable for Xpert Xpress SARS-CoV-2/FLU/RSV testing.  Fact Sheet for Patients: bloggercourse.com  Fact Sheet for Healthcare Providers: seriousbroker.it  This test is not yet approved or cleared by the [place]  FDA and has been authorized for detection and/or diagnosis of SARS-CoV-2 by FDA under an Emergency Use Authorization (EUA). This EUA will remain in effect (meaning this test can be used) for the duration of the COVID-19 declaration under Section 564(b)(1) of the Act, 21 U.S.C. section 360bbb-3(b)(1), unless the authorization is terminated or revoked.     Resp Syncytial Virus by PCR NEGATIVE NEGATIVE Final  Comment: (NOTE) Fact Sheet for Patients: bloggercourse.com  Fact Sheet  for Healthcare Providers: seriousbroker.it  This test is not yet approved or cleared by the [place]  FDA and has been authorized for detection and/or diagnosis of SARS-CoV-2 by FDA under an Emergency Use Authorization (EUA). This EUA will remain in effect (meaning this test can be used) for the duration of the COVID-19 declaration under Section 564(b)(1) of the Act, 21 U.S.C. section 360bbb-3(b)(1), unless the authorization is terminated or revoked.  Performed at [facility], [address], [place], [place] [address]   Urine Culture     Status: Abnormal   Collection Time: 07/15/24  9:30 PM   Specimen: Urine, Clean Catch  Result Value Ref Range Status   Specimen Description   Final    URINE, CLEAN CATCH Performed at [facility], [address], [place], [place] [address]    Special Requests   Final    NONE Performed at [facility], [address], [place], [place] [address]    Culture (A)  Final    10,000 COLONIES/mL GROUP B STREP(S.AGALACTIAE)ISOLATED TESTING AGAINST S. AGALACTIAE NOT ROUTINELY PERFORMED DUE TO PREDICTABILITY OF AMP/PEN/VAN SUSCEPTIBILITY. Performed at [facility], [address] [place], [place] [address]    Report Status 07/17/2024 FINAL  Final  Blood culture (routine x 2)     Status: Abnormal   Collection Time: 07/15/24  9:58 PM   Specimen: BLOOD  Result Value Ref Range Status   Specimen Description   Final    BLOOD RIGHT ANTECUBITAL Performed at [facility], [address] [place], [place] [address]    Special Requests   Final    BOTTLES DRAWN AEROBIC AND ANAEROBIC Blood Culture adequate volume Performed at [facility], [address] [place], [place] [address]    Culture  Setup Time   Final    GRAM POSITIVE COCCI IN CLUSTERS AEROBIC BOTTLE ONLY CRITICAL RESULT CALLED TO, READ BACK BY AND VERIFIED WITH:  PHARMD BLAKE W [id number] FCP    Culture (A)  Final    STAPHYLOCOCCUS EPIDERMIDIS THE SIGNIFICANCE OF ISOLATING THIS ORGANISM FROM A SINGLE SET OF BLOOD CULTURES WHEN MULTIPLE SETS ARE DRAWN IS UNCERTAIN. PLEASE NOTIFY THE MICROBIOLOGY DEPARTMENT WITHIN ONE WEEK IF SPECIATION AND SENSITIVITIES ARE REQUIRED. Performed at [facility], [address] [place], [place] [address]    Report Status 07/18/2024 FINAL  Final  Blood Culture ID Panel (Reflexed)     Status: Abnormal   Collection Time: 07/15/24  9:58 PM  Result Value Ref Range Status   Enterococcus faecalis NOT DETECTED NOT DETECTED Final   Enterococcus Faecium NOT DETECTED NOT DETECTED Final   Listeria monocytogenes NOT DETECTED NOT DETECTED Final   Staphylococcus species DETECTED (A) NOT DETECTED Final    Comment: CRITICAL RESULT CALLED TO, READ BACK BY AND VERIFIED WITH: MAYA FELICIANO ORN [id number] FCP    Staphylococcus aureus (BCID) NOT DETECTED NOT DETECTED Final   Staphylococcus epidermidis DETECTED (A) NOT DETECTED Final    Comment: Methicillin (oxacillin) resistant coagulase negative staphylococcus. Possible blood culture contaminant (unless isolated from more than one blood culture draw or clinical case suggests pathogenicity). No antibiotic treatment is indicated for blood  culture contaminants. CRITICAL RESULT CALLED TO, READ BACK BY AND VERIFIED WITH: PHARMD BLAKE W [id number] FCP    Staphylococcus lugdunensis NOT DETECTED NOT DETECTED Final   Streptococcus species NOT DETECTED NOT DETECTED Final   Streptococcus agalactiae NOT DETECTED NOT  DETECTED Final   Streptococcus pneumoniae NOT DETECTED NOT DETECTED Final   Streptococcus pyogenes NOT DETECTED NOT DETECTED Final   A.calcoaceticus-baumannii NOT DETECTED NOT DETECTED Final   Bacteroides fragilis NOT DETECTED NOT DETECTED Final   Enterobacterales NOT DETECTED NOT DETECTED Final   Enterobacter cloacae complex NOT DETECTED NOT DETECTED Final   Escherichia  coli NOT DETECTED NOT DETECTED Final   Klebsiella aerogenes NOT DETECTED NOT DETECTED Final   Klebsiella oxytoca NOT DETECTED NOT DETECTED Final   Klebsiella pneumoniae NOT DETECTED NOT DETECTED Final   Proteus species NOT DETECTED NOT DETECTED Final   Salmonella species NOT DETECTED NOT DETECTED Final   Serratia marcescens NOT DETECTED NOT DETECTED Final   Haemophilus influenzae NOT DETECTED NOT DETECTED Final   Neisseria meningitidis NOT DETECTED NOT DETECTED Final   Pseudomonas aeruginosa NOT DETECTED NOT DETECTED Final   Stenotrophomonas maltophilia NOT DETECTED NOT DETECTED Final   Candida albicans NOT DETECTED NOT DETECTED Final   Candida auris NOT DETECTED NOT DETECTED Final   Candida glabrata NOT DETECTED NOT DETECTED Final   Candida krusei NOT DETECTED NOT DETECTED Final   Candida parapsilosis NOT DETECTED NOT DETECTED Final   Candida tropicalis NOT DETECTED NOT DETECTED Final   Cryptococcus neoformans/gattii NOT DETECTED NOT DETECTED Final   Methicillin resistance mecA/C DETECTED (A) NOT DETECTED Final    Comment: CRITICAL RESULT CALLED TO, READ BACK BY AND VERIFIED WITH: MAYA FELICIANO ORN [phone] FCP Performed at [facility], [address] [place], [place] [address]   Respiratory (~20 pathogens) panel by PCR     Status: None   Collection Time: 07/16/24  2:25 PM   Specimen: Nasopharyngeal Swab; Respiratory  Result Value Ref Range Status   Adenovirus NOT DETECTED NOT DETECTED Final   Coronavirus 229E NOT DETECTED NOT DETECTED Final    Comment: (NOTE) The Coronavirus on the Respiratory Panel, DOES NOT test for the novel  Coronavirus (2019 nCoV)    Coronavirus HKU1 NOT DETECTED NOT DETECTED Final   Coronavirus NL63 NOT DETECTED NOT DETECTED Final   Coronavirus OC43 NOT DETECTED NOT DETECTED Final   Metapneumovirus NOT DETECTED NOT DETECTED Final   Rhinovirus / Enterovirus NOT DETECTED NOT DETECTED Final   Influenza A NOT DETECTED NOT DETECTED Final   Influenza  B NOT DETECTED NOT DETECTED Final   Parainfluenza Virus 1 NOT DETECTED NOT DETECTED Final   Parainfluenza Virus 2 NOT DETECTED NOT DETECTED Final   Parainfluenza Virus 3 NOT DETECTED NOT DETECTED Final   Parainfluenza Virus 4 NOT DETECTED NOT DETECTED Final   Respiratory Syncytial Virus NOT DETECTED NOT DETECTED Final   Bordetella pertussis NOT DETECTED NOT DETECTED Final   Bordetella Parapertussis NOT DETECTED NOT DETECTED Final   Chlamydophila pneumoniae NOT DETECTED NOT DETECTED Final   Mycoplasma pneumoniae NOT DETECTED NOT DETECTED Final    Comment: Performed at [facility], [address], [place], [place] [address]    Radiology Studies: VAS US  LOWER EXTREMITY VENOUS (DVT) Result Date: 07/17/2024  Lower Venous DVT Study Patient Name:  Carolyn Zimmerman  Date of Exam:   07/17/2024 Medical Rec #: [id number]     Accession #:    [id number] Date of Birth: [birth year]     Patient Gender: F Patient Age:   58 years Exam Location:  [facility] Procedure:      VAS US  LOWER EXTREMITY VENOUS (DVT) Referring Phys: MIGNON Advik Weatherspoon --------------------------------------------------------------------------------  Indications: Asymmetric leg swelling. Sepsis secondary to right lower extremity cellulitis.  Risk  Factors: Right venous reflux disease diagnosed 02/21/24. Limitations: Body habitus. Comparison Study: Prior negative right LEV done 10/31/23 and 10/13/23 Performing Technologist: Edilia Elden Appl  Examination Guidelines: A complete evaluation includes B-mode imaging, spectral Doppler, color Doppler, and power Doppler as needed of all accessible portions of each vessel. Bilateral testing is considered an integral part of a complete examination. Limited examinations for reoccurring indications may be performed as noted. The reflux portion of the exam is performed with the patient in reverse Trendelenburg.  +---------+---------------+---------+-----------+----------+--------------+ RIGHT     CompressibilityPhasicitySpontaneityPropertiesThrombus Aging +---------+---------------+---------+-----------+----------+--------------+ CFV      Full           Yes      Yes                                 +---------+---------------+---------+-----------+----------+--------------+ SFJ      Full           Yes      Yes                                 +---------+---------------+---------+-----------+----------+--------------+ FV Prox  Full                                                        +---------+---------------+---------+-----------+----------+--------------+ FV Mid   Full                                                        +---------+---------------+---------+-----------+----------+--------------+ FV DistalFull                                                        +---------+---------------+---------+-----------+----------+--------------+ PFV      Full                                                        +---------+---------------+---------+-----------+----------+--------------+ POP      Full           Yes      Yes                                 +---------+---------------+---------+-----------+----------+--------------+ PTV      Full                                                        +---------+---------------+---------+-----------+----------+--------------+ PERO     Full                                                        +---------+---------------+---------+-----------+----------+--------------+   +---------+---------------+---------+-----------+----------+--------------+  LEFT     CompressibilityPhasicitySpontaneityPropertiesThrombus Aging +---------+---------------+---------+-----------+----------+--------------+ CFV      Full           Yes      Yes                                 +---------+---------------+---------+-----------+----------+--------------+ SFJ      Full           Yes      Yes                                  +---------+---------------+---------+-----------+----------+--------------+ FV Prox  Full                                                        +---------+---------------+---------+-----------+----------+--------------+ FV Mid   Full                                                        +---------+---------------+---------+-----------+----------+--------------+ FV DistalFull                                                        +---------+---------------+---------+-----------+----------+--------------+ PFV      Full           Yes      Yes                                 +---------+---------------+---------+-----------+----------+--------------+ POP      Full                                                        +---------+---------------+---------+-----------+----------+--------------+ PTV      Full                                                        +---------+---------------+---------+-----------+----------+--------------+ PERO     Full                                                        +---------+---------------+---------+-----------+----------+--------------+     Summary: BILATERAL: - No evidence of deep vein thrombosis seen in the lower extremities, bilaterally. -No evidence of popliteal cyst, bilaterally.   *See table(s) above for measurements and observations. Electronically signed by Debby Robertson on 07/17/2024 at 8:15:19 PM.    Final  Macguire Holsinger T. Keysi Oelkers [facility]  If 7PM-7AM, please contact night-coverage [email or web address] 07/18/2024, 1:28 PM

## 2024-07-19 DIAGNOSIS — L03115 Cellulitis of right lower limb: Secondary | ICD-10-CM | POA: Diagnosis not present

## 2024-07-19 DIAGNOSIS — L0390 Cellulitis, unspecified: Secondary | ICD-10-CM | POA: Diagnosis not present

## 2024-07-19 DIAGNOSIS — A419 Sepsis, unspecified organism: Secondary | ICD-10-CM | POA: Diagnosis not present

## 2024-07-19 LAB — CBC
HCT: 30.6 % — ABNORMAL LOW (ref 36.0–46.0)
Hemoglobin: 9.6 g/dL — ABNORMAL LOW (ref 12.0–15.0)
MCH: 26.2 pg (ref 26.0–34.0)
MCHC: 31.4 g/dL (ref 30.0–36.0)
MCV: 83.4 fL (ref 80.0–100.0)
Platelets: 352 K/uL (ref 150–400)
RBC: 3.67 MIL/uL — ABNORMAL LOW (ref 3.87–5.11)
RDW: 13.4 % (ref 11.5–15.5)
WBC: 8.6 K/uL (ref 4.0–10.5)
nRBC: 0 % (ref 0.0–0.2)

## 2024-07-19 LAB — RENAL FUNCTION PANEL
Albumin: 1.8 g/dL — ABNORMAL LOW (ref 3.5–5.0)
Anion gap: 11 (ref 5–15)
BUN: 11 mg/dL (ref 6–20)
CO2: 22 mmol/L (ref 22–32)
Calcium: 8.2 mg/dL — ABNORMAL LOW (ref 8.9–10.3)
Chloride: 102 mmol/L (ref 98–111)
Creatinine, Ser: 1.01 mg/dL — ABNORMAL HIGH (ref 0.44–1.00)
GFR, Estimated: >60 mL/min (ref >60)
Glucose, Bld: 295 mg/dL — ABNORMAL HIGH (ref 70–99)
Phosphorus: 3.5 mg/dL (ref 2.5–4.6)
Potassium: 3.6 mmol/L (ref 3.5–5.1)
Sodium: 135 mmol/L (ref 135–145)

## 2024-07-19 LAB — GLUCOSE, CAPILLARY
Glucose-Capillary: 308 mg/dL — ABNORMAL HIGH (ref 70–99)
Glucose-Capillary: 257 mg/dL — ABNORMAL HIGH (ref 70–99)
Glucose-Capillary: 152 mg/dL — ABNORMAL HIGH (ref 70–99)
Glucose-Capillary: 198 mg/dL — ABNORMAL HIGH (ref 70–99)

## 2024-07-19 LAB — MAGNESIUM: Magnesium: 1.8 mg/dL (ref 1.7–2.4)

## 2024-07-19 MED ORDER — INSULIN ASPART 100 UNIT/ML IJ SOLN
10.0000 [IU] | Freq: Three times a day (TID) | INTRAMUSCULAR | Status: DC
  Administered 2024-07-19 – 2024-07-21 (×8): 10 [IU] via SUBCUTANEOUS
  Filled 2024-07-19 (×3): qty 10

## 2024-07-19 MED ORDER — INSULIN GLARGINE-YFGN 100 UNIT/ML ~~LOC~~ SOLN
45.0000 [IU] | Freq: Every day | SUBCUTANEOUS | Status: DC
  Administered 2024-07-19 – 2024-07-20 (×2): 45 [IU] via SUBCUTANEOUS
  Filled 2024-07-19 (×2): qty 0.45

## 2024-07-19 NOTE — Progress Notes (Signed)
 Physical Therapy Evaluation Patient Details Name: Carolyn Zimmerman MRN: [id number] DOB: [birth year] Today's Date: 07/19/2024  History of Present Illness  Pt is a 38 y/o female presenting 10/28 from [facility] with generalized body pain.  PMHx, CVA10/23, HTN, jaundice,  Clinical Impression  Pt admitted with/for general body pain likely due to sepsis from R LE cellulitis.  Pt is generally Independent in the room, but has not been able to motivate herself to walk.  Will have mobility team continue to encourage OOB/ambulation.  Pt currently limited functionally due to the problems listed below.  (see problems list.)  Pt will benefit from PT to maximize function and safety to be able to get home safely with available assist .         If plan is discharge home, recommend the following: Other (comment) (PRN assist)   Can travel by private vehicle        Equipment Recommendations None recommended by PT  Recommendations for Other Services       Functional Status Assessment Patient has had a recent decline in their functional status and demonstrates the ability to make significant improvements in function in a reasonable and predictable amount of time.     Precautions / Restrictions Precautions Precautions: None (low fall risk presently with pain)      Mobility  Bed Mobility Overal bed mobility: Modified Independent                  Transfers Overall transfer level: Modified independent                      Ambulation/Gait Ambulation/Gait assistance: Supervision, Modified independent (Device/Increase time) Gait Distance (Feet): 130 Feet Assistive device: None (rail) Gait Pattern/deviations: Step-to pattern, Step-through pattern   Gait velocity interpretation: <1.31 ft/sec, indicative of household ambulator   General Gait Details: antalgic steps on the right.  Mildly unsteady, but manages without external assist.  Used rail to help take weight off the R LE some of  the time up.  Stairs            Wheelchair Mobility     Tilt Bed    Modified Rankin (Stroke Patients Only)       Balance Overall balance assessment: Needs assistance Sitting-balance support: No upper extremity supported, Feet supported Sitting balance-Leahy Scale: Good     Standing balance support: No upper extremity supported, During functional activity Standing balance-Leahy Scale: Fair                               Pertinent Vitals/Pain Pain Assessment Pain Assessment: Faces Faces Pain Scale: Hurts even more Pain Location: RLE Pain Descriptors / Indicators: Sharp Pain Intervention(s): Limited activity within patient's tolerance, Monitored during session    Home Living Family/patient expects to be discharged to:: Private residence Living Arrangements: Spouse/significant other;Parent Available Help at Discharge: Family;Available PRN/intermittently Type of Home: Apartment Home Access: Stairs to enter Entrance Stairs-Rails: Right;Left Entrance Stairs-Number of Steps: flight Alternate Level Stairs-Number of Steps: 10 Home Layout: One level Home Equipment: None      Prior Function Prior Level of Function : Independent/Modified Independent             Mobility Comments: walks without AD ADLs Comments: independent     Extremity/Trunk Assessment   Upper Extremity Assessment Upper Extremity Assessment: Overall WFL for tasks assessed    Lower Extremity Assessment Lower Extremity Assessment: Generalized weakness;Overall [facility] for tasks  assessed;RLE deficits/detail (R LE pain from cellulitis, general diabetic neuropathy)       Communication   Communication Communication: No apparent difficulties    Cognition Arousal: Alert Behavior During Therapy: WFL for tasks assessed/performed   PT - Cognitive impairments: No apparent impairments                         Following commands: Intact       Cueing Cueing Techniques:  Verbal cues     General Comments General comments (skin integrity, edema, etc.): vss    Exercises     Assessment/Plan    PT Assessment Patient needs continued PT services (primary expected to be through mobility team)  PT Problem List Decreased activity tolerance;Decreased mobility;Pain       PT Treatment Interventions Gait training;Stair training;Functional mobility training;Therapeutic activities;Patient/family education    PT Goals (Current goals can be found in the Care Plan section)  Acute Rehab PT Goals Patient Stated Goal: back to independence PT Goal Formulation: With patient Time For Goal Achievement: 07/26/24 Potential to Achieve Goals: Good    Frequency Min 2X/week     Co-evaluation               AM-PAC PT 6 Clicks Mobility  Outcome Measure Help needed turning from your back to your side while in a flat bed without using bedrails?: None Help needed moving from lying on your back to sitting on the side of a flat bed without using bedrails?: None Help needed moving to and from a bed to a chair (including a wheelchair)?: None Help needed standing up from a chair using your arms (e.g., wheelchair or bedside chair)?: None Help needed to walk in hospital room?: A Little Help needed climbing 3-5 steps with a railing? : A Little 6 Click Score: 22    End of Session   Activity Tolerance: Patient tolerated treatment well;Patient limited by pain Patient left: in bed;with call bell/phone within reach Nurse Communication: Mobility status PT Visit Diagnosis: Other abnormalities of gait and mobility (R26.89);Pain Pain - Right/Left: Right Pain - part of body: Leg    Time: 8873-8846 PT Time Calculation (min) (ACUTE ONLY): 27 min   Charges:   PT Evaluation $PT Eval Low Complexity: 1 Low PT Treatments $Gait Training: 8-22 mins PT General Charges $$ ACUTE PT VISIT: 1 Visit         07/19/2024  [place] HERO., PT Acute Rehabilitation Services [phone]   (office)  Vinie GAILS Nannette Zill 07/19/2024, 12:04 PM

## 2024-07-19 NOTE — Plan of Care (Signed)
 Pt given miralax  and senna to help with no BM since Tuesday. Pain managed with PRN pain medication. Per pt pain always 6-7/10 but tolerable and does not want to take anything for pain unless pain is unbearable. Pt educated on pain scale and early intervention. Pt glucose elevated this AM to 308 covered with 15 units of SSI. Pt has been consuming personal beverage with glucose in it and quantity of fluid intake overnight and importance on monitoring accurate intake and output.    Problem: Education: Goal: Ability to describe self-care measures that may prevent or decrease complications (Diabetes Survival Skills Education) will improve Outcome: Progressing   Problem: Fluid Volume: Goal: Ability to maintain a balanced intake and output will improve Outcome: Progressing   Problem: Health Behavior/Discharge Planning: Goal: Ability to identify and utilize available resources and services will improve Outcome: Progressing   Problem: Skin Integrity: Goal: Risk for impaired skin integrity will decrease Outcome: Progressing   Problem: Activity: Goal: Risk for activity intolerance will decrease Outcome: Progressing   Problem: Coping: Goal: Level of anxiety will decrease Outcome: Progressing   Problem: Elimination: Goal: Will not experience complications related to urinary retention Outcome: Progressing   Problem: Pain Managment: Goal: General experience of comfort will improve and/or be controlled Outcome: Progressing

## 2024-07-19 NOTE — Progress Notes (Signed)
 PROGRESS NOTE  Carolyn Zimmerman FMW:[id number] DOB: [birth year]   PCP: Leonarda Roxan BROCKS, NP  Patient is from: Home.  Independently ambulates at baseline.  DOA: 07/15/2024 LOS: 2  Chief complaints Chief Complaint  Patient presents with   Cough     Brief Narrative / Interim history: 38 year old F with PMH of HFmrEF, CVA, IDDM-2, HTN and morbid obesity presented [facility] with generalized body pain including legs, back and stomach as well as some chills and chest congestion.  Initially, admission requested for possible acute CHF.  She was given Lasix , ibuprofen  and ceftriaxone  in ED.   On arrival to [facility], patient with significant RLE cellulitis.  Continued on ceftriaxone .   Blood culture with MRSE in 1 out of 4 bottles.  TTE without significant finding.  LE venous Doppler negative.  Right lower extremity CT suggested cellulitis.  Vancomycin  added.  Cellulitis improved some but still with significant swelling right leg.  De-escalated antibiotics to p.o. Zyvox and Augmentin  on 10/31.   Subjective: Seen and examined earlier this morning.  No major events overnight or this morning.  Continues to endorse significant swelling and pain in right leg.  She also does not feel she is making enough urine although 4.7 L UOP registered from the last 24 hours.  She reports drinking a lot of water.  He is eating [employer] cookies   Assessment and plan: Sepsis due to RLE cellulitis: Patient has RLE erythema with increased warmth to touch consistent with cellulitis.  A1c 13.5%.  CK normal. Blood culture with MRSE in 1 out of 4 bottles likely contaminant.  Contrast CT suggested cellulitis with no fluid collection or osseous abnormalities.  Still with significant swelling and pain in RLE. -De-escalated antibiotics to Zyvox and Augmentin  -Pain control and leg elevation -Continue diuretics. -Need to improve glycemic control -PT eval   Bilateral lower extremity edema/pain: Has BLE edema.  Unclear how  much of this is acute.  She is on amlodipine  10 mg daily which could contribute.  Seems like she also has neuropathic pain from poorly controlled diabetes.  Lower extremity venous Doppler negative for DVT.  Still with significant edema in RLE. -Continue holding amlodipine  -Antibiotics as above. -Continue home torsemide  and Aldactone . -Fluid restriction to 1500 cc a day. -Continue low-dose gabapentin .    Chronic HFimpEF: TTE normal (LVEF of 40 to 45% GH G1DD in 10/2023).  She has BLE edema.  proBNP slightly elevated.  Inconsistently uses torsemide  at home.  Had about 4.7 L UOP/24 hours.  Cr stable. -Continue home torsemide , Aldactone  and Coreg . -Start fluid restriction to 1500 cc an hour -Strict intake and output, daily weight, renal functions and electrolyte   Poorly controlled IDDM-2 with hyperglycemia: A1c 13.5%.  Reports using Lantus  30 units at bedtime and Humalog  16 units 3 times daily before meals.  Hyperglycemia likely due to dietary indiscretion. Recent Labs  Lab 07/18/24 0633 07/18/24 1108 07/18/24 1502 07/18/24 2144 07/19/24 0516  GLUCAP 282* 314* 234* 276* 308*  -Increase Semglee  from 35 to 45 units daily -Increase NovoLog  from 7 to 10 units 3 times daily with meals -Continue resistant scale insulin  -Advised to avoid foods from outside.  -Appreciate help by diabetic coordinator. -Further adjustment as appropriate   Chest congestion: No history of asthma or COPD.  She has no URI symptoms.  proBNP slightly elevated.  Received IV Lasix  in ED. TTE reassuring.  RVP negative. - Continue diuretics as above.  Elevated troponin: Mild without significant delta.  Patient has no chest  pain.  Likely demand ischemia.  Recent coronary CTA nondiagnostic.  TTE reassuring. - Continue home Coreg , started on aspirin .   History of CVA: No focal neurodeficit. - Continue started on aspirin .   Essential hypertension: BP was elevated but improved.  On amlodipine , Coreg , Aldactone , torsemide  -  Continue home Coreg , torsemide  and Aldactone . - Hold amlodipine  given BLE edema   Hepatomegaly: Noted on CT.  Fatty liver? -Outpatient follow-up with GI   Bilateral inguinal lymphadenopathy: Likely reactive from cellulitis. - Consider repeat CT once cellulitis fully treated  Enlarged thyroid gland: Low TSH but free T4 suggesting euthyroid sick syndrome. -Recheck thyroid panel in 4 to 6 weeks. -Nonemergent thyroid ultrasound outpatient.   Hypokalemia -Monitor replenish K and Mg as appropriate   Morbid obesity Body mass index is 45.96 kg/m. - Encourage lifestyle change to lose weight         DVT prophylaxis:  On subcu Lovenox .  Code Status: Full code Family Communication: None at the bedside Level of care: Telemetry Status is: Inpatient Remains inpatient appropriate because: Sepsis due to cellulitis   Final disposition: Likely home once medically stable   55 minutes with more than 50% spent in reviewing records, counseling patient/family and coordinating care.  Consultants:  None  Procedures: None  Microbiology summarized: Blood culture with Staph epidermidis in 1 out of 4 bottles Urine culture with 10,000 colonies of Streptococcus agalactiae  Objective: Vitals:   07/19/24 0005 07/19/24 0414 07/19/24 0418 07/19/24 0725  BP: (!) 159/78  (!) 165/86 (!) 158/87  Pulse: 78   74  Resp: 20  (!) 22 (!) 22  Temp: 98.6 F (37 C) 98.3 F (36.8 C)  98.4 F (36.9 C)  TempSrc: Oral Oral  Oral  SpO2: 98% 97%  98%  Weight:   135.1 kg   Height:        Examination:  GENERAL: No acute distress.  Appears well.  HEENT: MMM.  Vision and hearing grossly intact.  NECK: Supple.  No apparent JVD.  RESP:  No IWOB. Good air movement bilaterally. CVS:  RRR. Heart sounds normal.  ABD/GI/GU: Bowel sounds present. Soft. Non tender.  No CVA tenderness. MSK/EXT:  Moves extremities.  1+ BLE edema.  RLE erythema to her knee.  SKIN: RLE erythema to below her knee.  Increased warmth  to touch in RLE.  Small cracks over lateral aspect of right heel. NEURO: Awake, alert and oriented appropriately.  No gross deficit.  PSYCH: Calm. Normal affect.    Sch Meds:  Scheduled Meds:  amoxicillin -clavulanate  1 tablet Oral Q12H   aspirin   81 mg Oral Daily   carvedilol   25 mg Oral BID WC   enoxaparin  (LOVENOX ) injection  65 mg Subcutaneous Q24H   gabapentin   300 mg Oral BID   insulin  aspart  0-20 Units Subcutaneous TID WC   insulin  aspart  0-5 Units Subcutaneous QHS   insulin  aspart  7 Units Subcutaneous TID WC   insulin  glargine-yfgn  45 Units Subcutaneous Daily   linezolid  600 mg Oral Q12H   rosuvastatin   20 mg Oral Daily   spironolactone   25 mg Oral Daily   torsemide   40 mg Oral Daily   Continuous Infusions:   PRN Meds:.acetaminophen  **OR** acetaminophen , HYDROmorphone  (DILAUDID ) injection, ondansetron  (ZOFRAN ) IV, oxyCODONE , polyethylene glycol, senna-docusate  Antimicrobials: Anti-infectives (From admission, onward)    Start     Dose/Rate Route Frequency Ordered Stop   07/18/24 1045  linezolid (ZYVOX) tablet 600 mg  600 mg Oral Every 12 hours 07/18/24 0954 07/24/24 2359   07/18/24 1045  amoxicillin -clavulanate (AUGMENTIN ) 875-125 MG per tablet 1 tablet        1 tablet Oral Every 12 hours 07/18/24 0954 07/24/24 2359   07/17/24 2200  vancomycin  (VANCOREADY) IVPB 750 mg/150 mL  Status:  Discontinued        750 mg 150 mL/hr over 60 Minutes Intravenous Every 8 hours 07/17/24 1222 07/18/24 0954   07/17/24 1315  vancomycin  (VANCOCIN ) 2,500 mg in sodium chloride  0.9 % 500 mL IVPB        2,500 mg 262.5 mL/hr over 120 Minutes Intravenous  Once 07/17/24 1222 07/17/24 1611   07/17/24 1215  vancomycin  (VANCOREADY) IVPB 750 mg/150 mL  Status:  Discontinued        750 mg 150 mL/hr over 60 Minutes Intravenous Every 8 hours 07/17/24 1119 07/17/24 1222   07/16/24 1730  cefTRIAXone  (ROCEPHIN ) 2 g in sodium chloride  0.9 % 100 mL IVPB  Status:  Discontinued        2 g 200  mL/hr over 30 Minutes Intravenous Every 24 hours 07/16/24 1634 07/18/24 0954   07/15/24 2130  cefTRIAXone  (ROCEPHIN ) 2 g in sodium chloride  0.9 % 100 mL IVPB        2 g 200 mL/hr over 30 Minutes Intravenous  Once 07/15/24 2129 07/16/24 0024        I have personally reviewed the following labs and images: CBC: Recent Labs  Lab 07/15/24 1803 07/16/24 1508 07/17/24 0301 07/18/24 0239 07/19/24 0655  WBC 22.9* 17.6* 12.9* 8.8 8.6  NEUTROABS 20.3*  --   --   --   --   HGB 11.0* 10.0* 9.5* 9.6* 9.6*  HCT 32.7* 31.2* 28.8* 30.7* 30.6*  MCV 79.4* 80.4 80.4 83.4 83.4  PLT 349 343 306 305 352   BMP &GFR Recent Labs  Lab 07/15/24 1803 07/16/24 1508 07/17/24 0301 07/18/24 0239 07/19/24 0655  NA 133* 133* 133* 137 135  K 3.4* 3.0* 3.7 3.9 3.6  CL 98 93* 100 101 102  CO2 25 25 23 24 22   GLUCOSE 338* 135* 248* 313* 295*  BUN 8 9 10 12 11   CREATININE 0.90 1.09* 0.98 1.01* 1.01*  CALCIUM  8.9 7.8* 7.8* 8.1* 8.2*  MG  --  1.6* 1.7 2.4 1.8  PHOS  --   --   --  3.5 3.5   Estimated Creatinine Clearance: 111.5 mL/min (A) (by C-G formula based on SCr of 1.01 mg/dL (H)). Liver & Pancreas: Recent Labs  Lab 07/15/24 1803 07/16/24 1508 07/17/24 0301 07/18/24 0239 07/19/24 0655  AST 19 19 16   --   --   ALT 13 15 16   --   --   ALKPHOS 94 64 66  --   --   BILITOT 0.5 0.4 0.2  --   --   PROT 6.7 5.9* 5.6*  --   --   ALBUMIN 3.2* 1.9* 1.8* 1.8* 1.8*   No results for input(s): LIPASE, AMYLASE in the last 168 hours. No results for input(s): AMMONIA in the last 168 hours. Diabetic: Recent Labs    07/16/24 1508  HGBA1C 13.5*   Recent Labs  Lab 07/18/24 0633 07/18/24 1108 07/18/24 1502 07/18/24 2144 07/19/24 0516  GLUCAP 282* 314* 234* 276* 308*   Cardiac Enzymes: Recent Labs  Lab 07/16/24 1508  CKTOTAL 215   Recent Labs    02/01/24 2319 07/15/24 1803  PROBNP 353.0* 1,386.0*   Coagulation Profile: No results for  input(s): INR, PROTIME in the last 168  hours. Thyroid Function Tests: Recent Labs    07/16/24 1756 07/17/24 0821  TSH 0.298*  --   FREET4  --  0.85   Lipid Profile: No results for input(s): CHOL, HDL, LDLCALC, TRIG, CHOLHDL, LDLDIRECT in the last 72 hours. Anemia Panel: No results for input(s): VITAMINB12, FOLATE, FERRITIN, TIBC, IRON, RETICCTPCT in the last 72 hours. Urine analysis:    Component Value Date/Time   COLORURINE YELLOW 07/15/2024 1857   APPEARANCEUR CLEAR 07/15/2024 1857   LABSPEC 1.007 07/15/2024 1857   PHURINE 6.5 07/15/2024 1857   GLUCOSEU 500 (A) 07/15/2024 1857   HGBUR SMALL (A) 07/15/2024 1857   BILIRUBINUR NEGATIVE 07/15/2024 1857   BILIRUBINUR Negative 05/14/2024 1205   KETONESUR NEGATIVE 07/15/2024 1857   PROTEINUR 100 (A) 07/15/2024 1857   UROBILINOGEN negative (A) 05/14/2024 1205   UROBILINOGEN 1.0 01/19/2012 0015   NITRITE NEGATIVE 07/15/2024 1857   LEUKOCYTESUR SMALL (A) 07/15/2024 1857   Sepsis Labs: Invalid input(s): PROCALCITONIN, LACTICIDVEN  Microbiology: Recent Results (from the past 240 hours)  Blood culture (routine x 2)     Status: None (Preliminary result)   Collection Time: 07/15/24  1:24 AM   Specimen: BLOOD LEFT HAND  Result Value Ref Range Status   Specimen Description   Final    BLOOD LEFT HAND Performed at [facility], [address], [place], [place] [address]    Special Requests   Final    BOTTLES DRAWN AEROBIC AND ANAEROBIC Blood Culture adequate volume Performed at [facility], [address] [place], [place] [address]    Culture   Final    NO GROWTH 3 DAYS Performed at [facility], [address], [place], [place] [address]    Report Status PENDING  Incomplete  Resp panel by RT-PCR (RSV, Flu A&B, Covid) Anterior Nasal Swab     Status: None   Collection Time: 07/15/24  2:59 PM   Specimen: Anterior Nasal Swab  Result Value Ref Range Status   SARS Coronavirus 2 by RT PCR  NEGATIVE NEGATIVE Final    Comment: (NOTE) SARS-CoV-2 target nucleic acids are NOT DETECTED.  The SARS-CoV-2 RNA is generally detectable in upper respiratory specimens during the acute phase of infection. The lowest concentration of SARS-CoV-2 viral copies this assay can detect is 138 copies/mL. A negative result does not preclude SARS-Cov-2 infection and should not be used as the sole basis for treatment or other patient management decisions. A negative result may occur with  improper specimen collection/handling, submission of specimen other than nasopharyngeal swab, presence of viral mutation(s) within the areas targeted by this assay, and inadequate number of viral copies(<138 copies/mL). A negative result must be combined with clinical observations, patient history, and epidemiological information. The expected result is Negative.  Fact Sheet for Patients:  bloggercourse.com  Fact Sheet for Healthcare Providers:  seriousbroker.it  This test is no t yet approved or cleared by the [place]  FDA and  has been authorized for detection and/or diagnosis of SARS-CoV-2 by FDA under an Emergency Use Authorization (EUA). This EUA will remain  in effect (meaning this test can be used) for the duration of the COVID-19 declaration under Section 564(b)(1) of the Act, 21 U.S.C.section 360bbb-3(b)(1), unless the authorization is terminated  or revoked sooner.       Influenza A by PCR NEGATIVE NEGATIVE Final   Influenza B by PCR NEGATIVE NEGATIVE Final    Comment: (NOTE) The Xpert Xpress SARS-CoV-2/FLU/RSV plus assay  is intended as an aid in the diagnosis of influenza from Nasopharyngeal swab specimens and should not be used as a sole basis for treatment. Nasal washings and aspirates are unacceptable for Xpert Xpress SARS-CoV-2/FLU/RSV testing.  Fact Sheet for Patients: bloggercourse.com  Fact Sheet for  Healthcare Providers: seriousbroker.it  This test is not yet approved or cleared by the [place]  FDA and has been authorized for detection and/or diagnosis of SARS-CoV-2 by FDA under an Emergency Use Authorization (EUA). This EUA will remain in effect (meaning this test can be used) for the duration of the COVID-19 declaration under Section 564(b)(1) of the Act, 21 U.S.C. section 360bbb-3(b)(1), unless the authorization is terminated or revoked.     Resp Syncytial Virus by PCR NEGATIVE NEGATIVE Final    Comment: (NOTE) Fact Sheet for Patients: bloggercourse.com  Fact Sheet for Healthcare Providers: seriousbroker.it  This test is not yet approved or cleared by the [place]  FDA and has been authorized for detection and/or diagnosis of SARS-CoV-2 by FDA under an Emergency Use Authorization (EUA). This EUA will remain in effect (meaning this test can be used) for the duration of the COVID-19 declaration under Section 564(b)(1) of the Act, 21 U.S.C. section 360bbb-3(b)(1), unless the authorization is terminated or revoked.  Performed at [facility], [address], [place], [place] [address]   Urine Culture     Status: Abnormal   Collection Time: 07/15/24  9:30 PM   Specimen: Urine, Clean Catch  Result Value Ref Range Status   Specimen Description   Final    URINE, CLEAN CATCH Performed at [facility], [address] [place], [place] [address]    Special Requests   Final    NONE Performed at [facility], [address] [place], [place] [address]    Culture (A)  Final    10,000 COLONIES/mL GROUP B STREP(S.AGALACTIAE)ISOLATED TESTING AGAINST S. AGALACTIAE NOT ROUTINELY PERFORMED DUE TO PREDICTABILITY OF AMP/PEN/VAN SUSCEPTIBILITY. Performed at [facility], [address] [place], [place] [address]    Report Status  07/17/2024 FINAL  Final  Blood culture (routine x 2)     Status: Abnormal   Collection Time: 07/15/24  9:58 PM   Specimen: BLOOD  Result Value Ref Range Status   Specimen Description   Final    BLOOD RIGHT ANTECUBITAL Performed at [facility], [address], [place], [place] [address]    Special Requests   Final    BOTTLES DRAWN AEROBIC AND ANAEROBIC Blood Culture adequate volume Performed at [facility], [address], [place], [place] [address]    Culture  Setup Time   Final    GRAM POSITIVE COCCI IN CLUSTERS AEROBIC BOTTLE ONLY CRITICAL RESULT CALLED TO, READ BACK BY AND VERIFIED WITH: PHARMD BLAKE W [id number] FCP    Culture (A)  Final    STAPHYLOCOCCUS EPIDERMIDIS THE SIGNIFICANCE OF ISOLATING THIS ORGANISM FROM A SINGLE SET OF BLOOD CULTURES WHEN MULTIPLE SETS ARE DRAWN IS UNCERTAIN. PLEASE NOTIFY THE MICROBIOLOGY DEPARTMENT WITHIN ONE WEEK IF SPECIATION AND SENSITIVITIES ARE REQUIRED. Performed at [facility], [address] [place], [place] [address]    Report Status 07/18/2024 FINAL  Final  Blood Culture ID Panel (Reflexed)     Status: Abnormal   Collection Time: 07/15/24  9:58 PM  Result Value Ref Range Status   Enterococcus faecalis NOT DETECTED NOT DETECTED Final   Enterococcus Faecium NOT DETECTED NOT DETECTED Final   Listeria monocytogenes NOT DETECTED  NOT DETECTED Final   Staphylococcus species DETECTED (A) NOT DETECTED Final    Comment: CRITICAL RESULT CALLED TO, READ BACK BY AND VERIFIED WITH: MAYA FELICIANO ORN [id number] FCP    Staphylococcus aureus (BCID) NOT DETECTED NOT DETECTED Final   Staphylococcus epidermidis DETECTED (A) NOT DETECTED Final    Comment: Methicillin (oxacillin) resistant coagulase negative staphylococcus. Possible blood culture contaminant (unless isolated from more than one blood culture draw or clinical case suggests pathogenicity). No antibiotic treatment is indicated for blood  culture  contaminants. CRITICAL RESULT CALLED TO, READ BACK BY AND VERIFIED WITH: PHARMD BLAKE W [id number] FCP    Staphylococcus lugdunensis NOT DETECTED NOT DETECTED Final   Streptococcus species NOT DETECTED NOT DETECTED Final   Streptococcus agalactiae NOT DETECTED NOT DETECTED Final   Streptococcus pneumoniae NOT DETECTED NOT DETECTED Final   Streptococcus pyogenes NOT DETECTED NOT DETECTED Final   A.calcoaceticus-baumannii NOT DETECTED NOT DETECTED Final   Bacteroides fragilis NOT DETECTED NOT DETECTED Final   Enterobacterales NOT DETECTED NOT DETECTED Final   Enterobacter cloacae complex NOT DETECTED NOT DETECTED Final   Escherichia coli NOT DETECTED NOT DETECTED Final   Klebsiella aerogenes NOT DETECTED NOT DETECTED Final   Klebsiella oxytoca NOT DETECTED NOT DETECTED Final   Klebsiella pneumoniae NOT DETECTED NOT DETECTED Final   Proteus species NOT DETECTED NOT DETECTED Final   Salmonella species NOT DETECTED NOT DETECTED Final   Serratia marcescens NOT DETECTED NOT DETECTED Final   Haemophilus influenzae NOT DETECTED NOT DETECTED Final   Neisseria meningitidis NOT DETECTED NOT DETECTED Final   Pseudomonas aeruginosa NOT DETECTED NOT DETECTED Final   Stenotrophomonas maltophilia NOT DETECTED NOT DETECTED Final   Candida albicans NOT DETECTED NOT DETECTED Final   Candida auris NOT DETECTED NOT DETECTED Final   Candida glabrata NOT DETECTED NOT DETECTED Final   Candida krusei NOT DETECTED NOT DETECTED Final   Candida parapsilosis NOT DETECTED NOT DETECTED Final   Candida tropicalis NOT DETECTED NOT DETECTED Final   Cryptococcus neoformans/gattii NOT DETECTED NOT DETECTED Final   Methicillin resistance mecA/C DETECTED (A) NOT DETECTED Final    Comment: CRITICAL RESULT CALLED TO, READ BACK BY AND VERIFIED WITH: MAYA FELICIANO ORN [phone] FCP Performed at [facility], [address] [place], [place] [address]   Respiratory (~20 pathogens) panel by PCR     Status: None    Collection Time: 07/16/24  2:25 PM   Specimen: Nasopharyngeal Swab; Respiratory  Result Value Ref Range Status   Adenovirus NOT DETECTED NOT DETECTED Final   Coronavirus 229E NOT DETECTED NOT DETECTED Final    Comment: (NOTE) The Coronavirus on the Respiratory Panel, DOES NOT test for the novel  Coronavirus (2019 nCoV)    Coronavirus HKU1 NOT DETECTED NOT DETECTED Final   Coronavirus NL63 NOT DETECTED NOT DETECTED Final   Coronavirus OC43 NOT DETECTED NOT DETECTED Final   Metapneumovirus NOT DETECTED NOT DETECTED Final   Rhinovirus / Enterovirus NOT DETECTED NOT DETECTED Final   Influenza A NOT DETECTED NOT DETECTED Final   Influenza B NOT DETECTED NOT DETECTED Final   Parainfluenza Virus 1 NOT DETECTED NOT DETECTED Final   Parainfluenza Virus 2 NOT DETECTED NOT DETECTED Final   Parainfluenza Virus 3 NOT DETECTED NOT DETECTED Final   Parainfluenza Virus 4 NOT DETECTED NOT DETECTED Final   Respiratory Syncytial Virus NOT DETECTED NOT DETECTED Final   Bordetella pertussis NOT DETECTED NOT DETECTED Final   Bordetella Parapertussis NOT DETECTED NOT DETECTED Final  Chlamydophila pneumoniae NOT DETECTED NOT DETECTED Final   Mycoplasma pneumoniae NOT DETECTED NOT DETECTED Final    Comment: Performed at [facility], [address], [place], [place] [address]    Radiology Studies: No results found.     Zaahir Pickney T. Bon[facility]  If 7PM-7AM, please contact night-coverage [email or web address] 07/19/2024, 11:47 AM

## 2024-07-20 DIAGNOSIS — L03115 Cellulitis of right lower limb: Secondary | ICD-10-CM | POA: Diagnosis not present

## 2024-07-20 DIAGNOSIS — L0390 Cellulitis, unspecified: Secondary | ICD-10-CM | POA: Diagnosis not present

## 2024-07-20 DIAGNOSIS — R2241 Localized swelling, mass and lump, right lower limb: Secondary | ICD-10-CM

## 2024-07-20 DIAGNOSIS — E1165 Type 2 diabetes mellitus with hyperglycemia: Secondary | ICD-10-CM | POA: Diagnosis not present

## 2024-07-20 DIAGNOSIS — A419 Sepsis, unspecified organism: Secondary | ICD-10-CM | POA: Diagnosis not present

## 2024-07-20 LAB — RENAL FUNCTION PANEL
Albumin: 1.9 g/dL — ABNORMAL LOW (ref 3.5–5.0)
Anion gap: 10 (ref 5–15)
BUN: 11 mg/dL (ref 6–20)
CO2: 26 mmol/L (ref 22–32)
Calcium: 8.4 mg/dL — ABNORMAL LOW (ref 8.9–10.3)
Chloride: 101 mmol/L (ref 98–111)
Creatinine, Ser: 0.89 mg/dL (ref 0.44–1.00)
GFR, Estimated: >60 mL/min (ref >60)
Glucose, Bld: 253 mg/dL — ABNORMAL HIGH (ref 70–99)
Phosphorus: 4.3 mg/dL (ref 2.5–4.6)
Potassium: 3.4 mmol/L — ABNORMAL LOW (ref 3.5–5.1)
Sodium: 137 mmol/L (ref 135–145)

## 2024-07-20 LAB — GLUCOSE, CAPILLARY
Glucose-Capillary: 165 mg/dL — ABNORMAL HIGH (ref 70–99)
Glucose-Capillary: 245 mg/dL — ABNORMAL HIGH (ref 70–99)
Glucose-Capillary: 187 mg/dL — ABNORMAL HIGH (ref 70–99)
Glucose-Capillary: 211 mg/dL — ABNORMAL HIGH (ref 70–99)

## 2024-07-20 LAB — CBC
HCT: 30.4 % — ABNORMAL LOW (ref 36.0–46.0)
Hemoglobin: 9.5 g/dL — ABNORMAL LOW (ref 12.0–15.0)
MCH: 25.6 pg — ABNORMAL LOW (ref 26.0–34.0)
MCHC: 31.3 g/dL (ref 30.0–36.0)
MCV: 81.9 fL (ref 80.0–100.0)
Platelets: 377 K/uL (ref 150–400)
RBC: 3.71 MIL/uL — ABNORMAL LOW (ref 3.87–5.11)
RDW: 13.4 % (ref 11.5–15.5)
WBC: 10.6 K/uL — ABNORMAL HIGH (ref 4.0–10.5)
nRBC: 0 % (ref 0.0–0.2)

## 2024-07-20 LAB — CK: Total CK: 105 U/L (ref 38–234)

## 2024-07-20 LAB — MAGNESIUM: Magnesium: 1.6 mg/dL — ABNORMAL LOW (ref 1.7–2.4)

## 2024-07-20 LAB — SEDIMENTATION RATE: Sed Rate: 102 mm/h — ABNORMAL HIGH (ref 0–22)

## 2024-07-20 LAB — C-REACTIVE PROTEIN: CRP: 2.1 mg/dL — ABNORMAL HIGH (ref <1.0)

## 2024-07-20 MED ORDER — MAGNESIUM SULFATE 4 GM/100ML IV SOLN
4.0000 g | Freq: Once | INTRAVENOUS | Status: AC
  Administered 2024-07-20: 4 g via INTRAVENOUS
  Filled 2024-07-20: qty 100

## 2024-07-20 MED ORDER — POTASSIUM CHLORIDE CRYS ER 20 MEQ PO TBCR
60.0000 meq | EXTENDED_RELEASE_TABLET | Freq: Once | ORAL | Status: AC
  Administered 2024-07-20: 60 meq via ORAL
  Filled 2024-07-20: qty 3

## 2024-07-20 MED ORDER — IRBESARTAN 75 MG PO TABS
75.0000 mg | ORAL_TABLET | Freq: Every day | ORAL | Status: DC
  Administered 2024-07-20 – 2024-07-21 (×2): 75 mg via ORAL
  Filled 2024-07-20 (×2): qty 1

## 2024-07-20 MED ORDER — SPIRONOLACTONE 25 MG PO TABS
50.0000 mg | ORAL_TABLET | Freq: Every day | ORAL | Status: DC
Start: 2024-07-21 — End: 2024-07-22
  Administered 2024-07-21 – 2024-07-22 (×2): 50 mg via ORAL
  Filled 2024-07-20 (×2): qty 2

## 2024-07-20 MED ORDER — INSULIN GLARGINE-YFGN 100 UNIT/ML ~~LOC~~ SOLN
50.0000 [IU] | Freq: Every day | SUBCUTANEOUS | Status: DC
  Administered 2024-07-21: 50 [IU] via SUBCUTANEOUS
  Filled 2024-07-20: qty 0.5

## 2024-07-20 NOTE — Progress Notes (Signed)
 PROGRESS NOTE  Carolyn Zimmerman FMW:[id number] DOB: Oct 27, 1985   PCP: Leonarda Roxan BROCKS, NP  Patient is from: Home.  Independently ambulates at baseline.  DOA: 07/15/2024 LOS: 3  Chief complaints Chief Complaint  Patient presents with   Cough     Brief Narrative / Interim history: 38 year old F with PMH of HFmrEF, CVA, IDDM-2, HTN and morbid obesity presented [facility] with generalized body pain including legs, back and stomach as well as some chills and chest congestion.  Initially, admission requested for possible acute CHF.  She was given Lasix , ibuprofen  and ceftriaxone  in ED.   On arrival to [facility], patient with significant RLE cellulitis.  Continued on ceftriaxone .   Blood culture with MRSE in 1 out of 4 bottles.  TTE without significant finding.  LE venous Doppler negative.  Right lower extremity CT suggested cellulitis.  Vancomycin  added.  Cellulitis improved some but still with significant swelling right leg.  De-escalated antibiotics to p.o. Zyvox and Augmentin  on 10/31.  ID consulted.  Subjective: Seen and examined earlier this morning.  No major events overnight or this morning.  Continues to endorse significant pain in right leg.  Still with significant swelling as well.  Tender to touch.  Assessment and plan: Sepsis due to RLE cellulitis: Prior history of RLE cellulitis earlier this year. A1c 13.5%.  CK normal. Blood culture with MRSE in 1 out of 4 bottles likely contaminant.  Contrast CT suggested cellulitis with no fluid collection or osseous abnormalities.  Still with significant swelling, pain and tenderness in RLE. -Continue Zyvox and Augmentin  -Pain control and leg elevation -Continue diuretics.  Hold amlodipine . -Need to improve glycemic control -ID consulted. -PT eval   Bilateral lower extremity edema/pain: Has BLE edema.  Unclear how much of this is acute.  She is on amlodipine  10 mg daily which could contribute.  Seems like she also has neuropathic  pain from poorly controlled diabetes.  Lower extremity venous Doppler negative for DVT.  Still with significant edema in RLE. -Continue holding amlodipine  -Antibiotics as above. -Continue home torsemide  and Aldactone . -Fluid restriction to 1500 cc a day. -Continue low-dose gabapentin .   Chronic HFimpEF: TTE normal (LVEF of 40 to 45% GH G1DD in 10/2023).  She has BLE edema.  proBNP slightly elevated.  Inconsistently uses torsemide  at home.  Net -7.8 L.  -Continue home torsemide , Aldactone  and Coreg . -Start fluid restriction to 1500 cc an hour -Strict intake and output, daily weight, renal functions and electrolyte -Follow morning labs.   Poorly controlled IDDM-2 with hyperglycemia: A1c 13.5%.  Reports using Lantus  30 units at bedtime and Humalog  16 units 3 times daily before meals.  Hyperglycemia likely due to dietary indiscretion. Recent Labs  Lab 07/19/24 0516 07/19/24 1158 07/19/24 1706 07/19/24 2106 07/20/24 0607  GLUCAP 308* 257* 152* 198* 165*  -Increase Semglee  from 45 to 50 units daily -Continue NovoLog  10 units 3 times daily with meals -Continue resistant scale insulin  -Advised to avoid foods from outside.  -Appreciate help by diabetic coordinator. -Further adjustment as appropriate   Chest congestion: No history of asthma or COPD.  She has no URI symptoms.  proBNP slightly elevated.  Received IV Lasix  in ED. TTE reassuring.  RVP negative. - Continue diuretics as above.  Elevated troponin: Mild without significant delta.  Patient has no chest pain.  Likely demand ischemia.  Recent coronary CTA nondiagnostic.  TTE reassuring. - Continue home Coreg , started on aspirin .   History of CVA: No focal neurodeficit. - Continue started on  aspirin .   Essential hypertension: BP was elevated but improved.  On amlodipine , Coreg , Aldactone , torsemide  -Continue home Coreg , torsemide  and Aldactone . -Add Avapro  75 mg daily -Hold amlodipine  given BLE edema   Hepatomegaly: Noted on CT.   Fatty liver? -Outpatient follow-up with GI   Bilateral inguinal lymphadenopathy: Likely reactive from cellulitis. - Consider repeat CT once cellulitis fully treated  Enlarged thyroid gland: Low TSH but free T4 suggesting euthyroid sick syndrome. -Recheck thyroid panel in 4 to 6 weeks. -Nonemergent thyroid ultrasound outpatient.   Hypokalemia -Monitor replenish K and Mg as appropriate   Morbid obesity Body mass index is 45.96 kg/m. - Encourage lifestyle change to lose weight         DVT prophylaxis:  On subcu Lovenox .  Code Status: Full code Family Communication: None at the bedside Level of care: Telemetry Status is: Inpatient Remains inpatient appropriate because: Sepsis due to cellulitis   Final disposition: Likely home once medically stable   55 minutes with more than 50% spent in reviewing records, counseling patient/family and coordinating care.  Consultants:  Infectious disease  Procedures: None  Microbiology summarized: Blood culture with Staph epidermidis in 1 out of 4 bottles Urine culture with 10,000 colonies of Streptococcus agalactiae  Objective: Vitals:   07/19/24 2116 07/19/24 2334 07/20/24 0410 07/20/24 0719  BP: (!) 155/83 (!) 179/101 (!) 160/89 (!) 172/86  Pulse:    73  Resp:  13 14 18   Temp:  98.5 F (36.9 C) 98.2 F (36.8 C) 98.7 F (37.1 C)  TempSrc:  Oral Oral Oral  SpO2:  100% 98% 97%  Weight:      Height:        Examination:  GENERAL: No acute distress.  Appears well.  HEENT: MMM.  Vision and hearing grossly intact.  NECK: Supple.  No apparent JVD.  RESP:  No IWOB. Good air movement bilaterally. CVS:  RRR. Heart sounds normal.  ABD/GI/GU: Bowel sounds present. Soft. Non tender.  No CVA tenderness. MSK/EXT:  Moves extremities.  RLE erythema, swelling and tenderness. SKIN: As above. NEURO: Awake, alert and oriented appropriately.  No gross deficit.  PSYCH: Calm. Normal affect.    Sch Meds:  Scheduled Meds:   amoxicillin -clavulanate  1 tablet Oral Q12H   aspirin   81 mg Oral Daily   carvedilol   25 mg Oral BID WC   enoxaparin  (LOVENOX ) injection  65 mg Subcutaneous Q24H   gabapentin   300 mg Oral BID   insulin  aspart  0-20 Units Subcutaneous TID WC   insulin  aspart  0-5 Units Subcutaneous QHS   insulin  aspart  10 Units Subcutaneous TID WC   [START ON 07/21/2024] insulin  glargine-yfgn  50 Units Subcutaneous Daily   linezolid  600 mg Oral Q12H   rosuvastatin   20 mg Oral Daily   spironolactone   25 mg Oral Daily   torsemide   40 mg Oral Daily   Continuous Infusions:   PRN Meds:.acetaminophen  **OR** acetaminophen , HYDROmorphone  (DILAUDID ) injection, ondansetron  (ZOFRAN ) IV, oxyCODONE , polyethylene glycol, senna-docusate  Antimicrobials: Anti-infectives (From admission, onward)    Start     Dose/Rate Route Frequency Ordered Stop   07/18/24 1045  linezolid (ZYVOX) tablet 600 mg        600 mg Oral Every 12 hours 07/18/24 0954 07/24/24 2359   07/18/24 1045  amoxicillin -clavulanate (AUGMENTIN ) 875-125 MG per tablet 1 tablet        1 tablet Oral Every 12 hours 07/18/24 0954 07/24/24 2359   07/17/24 2200  vancomycin  (VANCOREADY) IVPB 750 mg/150 mL  Status:  Discontinued        750 mg 150 mL/hr over 60 Minutes Intravenous Every 8 hours 07/17/24 1222 07/18/24 0954   07/17/24 1315  vancomycin  (VANCOCIN ) 2,500 mg in sodium chloride  0.9 % 500 mL IVPB        2,500 mg 262.5 mL/hr over 120 Minutes Intravenous  Once 07/17/24 1222 07/17/24 1611   07/17/24 1215  vancomycin  (VANCOREADY) IVPB 750 mg/150 mL  Status:  Discontinued        750 mg 150 mL/hr over 60 Minutes Intravenous Every 8 hours 07/17/24 1119 07/17/24 1222   07/16/24 1730  cefTRIAXone  (ROCEPHIN ) 2 g in sodium chloride  0.9 % 100 mL IVPB  Status:  Discontinued        2 g 200 mL/hr over 30 Minutes Intravenous Every 24 hours 07/16/24 1634 07/18/24 0954   07/15/24 2130  cefTRIAXone  (ROCEPHIN ) 2 g in sodium chloride  0.9 % 100 mL IVPB        2 g 200  mL/hr over 30 Minutes Intravenous  Once 07/15/24 2129 07/16/24 0024        I have personally reviewed the following labs and images: CBC: Recent Labs  Lab 07/15/24 1803 07/16/24 1508 07/17/24 0301 07/18/24 0239 07/19/24 0655  WBC 22.9* 17.6* 12.9* 8.8 8.6  NEUTROABS 20.3*  --   --   --   --   HGB 11.0* 10.0* 9.5* 9.6* 9.6*  HCT 32.7* 31.2* 28.8* 30.7* 30.6*  MCV 79.4* 80.4 80.4 83.4 83.4  PLT 349 343 306 305 352   BMP &GFR Recent Labs  Lab 07/15/24 1803 07/16/24 1508 07/17/24 0301 07/18/24 0239 07/19/24 0655  NA 133* 133* 133* 137 135  K 3.4* 3.0* 3.7 3.9 3.6  CL 98 93* 100 101 102  CO2 25 25 23 24 22   GLUCOSE 338* 135* 248* 313* 295*  BUN 8 9 10 12 11   CREATININE 0.90 1.09* 0.98 1.01* 1.01*  CALCIUM  8.9 7.8* 7.8* 8.1* 8.2*  MG  --  1.6* 1.7 2.4 1.8  PHOS  --   --   --  3.5 3.5   Estimated Creatinine Clearance: 111.5 mL/min (A) (by C-G formula based on SCr of 1.01 mg/dL (H)). Liver & Pancreas: Recent Labs  Lab 07/15/24 1803 07/16/24 1508 07/17/24 0301 07/18/24 0239 07/19/24 0655  AST 19 19 16   --   --   ALT 13 15 16   --   --   ALKPHOS 94 64 66  --   --   BILITOT 0.5 0.4 0.2  --   --   PROT 6.7 5.9* 5.6*  --   --   ALBUMIN 3.2* 1.9* 1.8* 1.8* 1.8*   No results for input(s): LIPASE, AMYLASE in the last 168 hours. No results for input(s): AMMONIA in the last 168 hours. Diabetic: No results for input(s): HGBA1C in the last 72 hours.  Recent Labs  Lab 07/19/24 0516 07/19/24 1158 07/19/24 1706 07/19/24 2106 07/20/24 0607  GLUCAP 308* 257* 152* 198* 165*   Cardiac Enzymes: Recent Labs  Lab 07/16/24 1508  CKTOTAL 215   Recent Labs    02/01/24 2319 07/15/24 1803  PROBNP 353.0* 1,386.0*   Coagulation Profile: No results for input(s): INR, PROTIME in the last 168 hours. Thyroid Function Tests: No results for input(s): TSH, T4TOTAL, FREET4, T3FREE, THYROIDAB in the last 72 hours.  Lipid Profile: No results for  input(s): CHOL, HDL, LDLCALC, TRIG, CHOLHDL, LDLDIRECT in the last 72 hours. Anemia Panel: No results for input(s): VITAMINB12, FOLATE, FERRITIN,  TIBC, IRON, RETICCTPCT in the last 72 hours. Urine analysis:    Component Value Date/Time   COLORURINE YELLOW 07/15/2024 1857   APPEARANCEUR CLEAR 07/15/2024 1857   LABSPEC 1.007 07/15/2024 1857   PHURINE 6.5 07/15/2024 1857   GLUCOSEU 500 (A) 07/15/2024 1857   HGBUR SMALL (A) 07/15/2024 1857   BILIRUBINUR NEGATIVE 07/15/2024 1857   BILIRUBINUR Negative 05/14/2024 1205   KETONESUR NEGATIVE 07/15/2024 1857   PROTEINUR 100 (A) 07/15/2024 1857   UROBILINOGEN negative (A) 05/14/2024 1205   UROBILINOGEN 1.0 01/19/2012 0015   NITRITE NEGATIVE 07/15/2024 1857   LEUKOCYTESUR SMALL (A) 07/15/2024 1857   Sepsis Labs: Invalid input(s): PROCALCITONIN, LACTICIDVEN  Microbiology: Recent Results (from the past 240 hours)  Blood culture (routine x 2)     Status: None (Preliminary result)   Collection Time: 07/15/24  1:24 AM   Specimen: BLOOD LEFT HAND  Result Value Ref Range Status   Specimen Description   Final    BLOOD LEFT HAND Performed at [facility], [address] [place], [place] [address]    Special Requests   Final    BOTTLES DRAWN AEROBIC AND ANAEROBIC Blood Culture adequate volume Performed at [facility], [address], [place], [place] [address]    Culture   Final    NO GROWTH 4 DAYS Performed at [facility], [address], [place], [place] [address]    Report Status PENDING  Incomplete  Resp panel by RT-PCR (RSV, Flu A&B, Covid) Anterior Nasal Swab     Status: None   Collection Time: 07/15/24  2:59 PM   Specimen: Anterior Nasal Swab  Result Value Ref Range Status   SARS Coronavirus 2 by RT PCR NEGATIVE NEGATIVE Final    Comment: (NOTE) SARS-CoV-2 target nucleic acids are NOT DETECTED.  The SARS-CoV-2 RNA is generally detectable in upper  respiratory specimens during the acute phase of infection. The lowest concentration of SARS-CoV-2 viral copies this assay can detect is 138 copies/mL. A negative result does not preclude SARS-Cov-2 infection and should not be used as the sole basis for treatment or other patient management decisions. A negative result may occur with  improper specimen collection/handling, submission of specimen other than nasopharyngeal swab, presence of viral mutation(s) within the areas targeted by this assay, and inadequate number of viral copies(<138 copies/mL). A negative result must be combined with clinical observations, patient history, and epidemiological information. The expected result is Negative.  Fact Sheet for Patients:  bloggercourse.com  Fact Sheet for Healthcare Providers:  seriousbroker.it  This test is no t yet approved or cleared by the [place]  FDA and  has been authorized for detection and/or diagnosis of SARS-CoV-2 by FDA under an Emergency Use Authorization (EUA). This EUA will remain  in effect (meaning this test can be used) for the duration of the COVID-19 declaration under Section 564(b)(1) of the Act, 21 U.S.C.section 360bbb-3(b)(1), unless the authorization is terminated  or revoked sooner.       Influenza A by PCR NEGATIVE NEGATIVE Final   Influenza B by PCR NEGATIVE NEGATIVE Final    Comment: (NOTE) The Xpert Xpress SARS-CoV-2/FLU/RSV plus assay is intended as an aid in the diagnosis of influenza from Nasopharyngeal swab specimens and should not be used as a sole basis for treatment. Nasal washings and aspirates are unacceptable for Xpert Xpress SARS-CoV-2/FLU/RSV testing.  Fact Sheet for Patients: bloggercourse.com  Fact Sheet for Healthcare Providers: seriousbroker.it  This test is not yet approved or cleared by the [place]  [place] FDA and has been  authorized for detection and/or diagnosis of SARS-CoV-2 by FDA under an Emergency Use Authorization (EUA). This EUA will remain in effect (meaning this test can be used) for the duration of the COVID-19 declaration under Section 564(b)(1) of the Act, 21 U.S.C. section 360bbb-3(b)(1), unless the authorization is terminated or revoked.     Resp Syncytial Virus by PCR NEGATIVE NEGATIVE Final    Comment: (NOTE) Fact Sheet for Patients: bloggercourse.com  Fact Sheet for Healthcare Providers: seriousbroker.it  This test is not yet approved or cleared by the [place]  FDA and has been authorized for detection and/or diagnosis of SARS-CoV-2 by FDA under an Emergency Use Authorization (EUA). This EUA will remain in effect (meaning this test can be used) for the duration of the COVID-19 declaration under Section 564(b)(1) of the Act, 21 U.S.C. section 360bbb-3(b)(1), unless the authorization is terminated or revoked.  Performed at [facility], [address] [place], [place] [address]   Urine Culture     Status: Abnormal   Collection Time: 07/15/24  9:30 PM   Specimen: Urine, Clean Catch  Result Value Ref Range Status   Specimen Description   Final    URINE, CLEAN CATCH Performed at [facility], [address], [place], [place] [address]    Special Requests   Final    NONE Performed at [facility], [address], [place], [place] [address]    Culture (A)  Final    10,000 COLONIES/mL GROUP B STREP(S.AGALACTIAE)ISOLATED TESTING AGAINST S. AGALACTIAE NOT ROUTINELY PERFORMED DUE TO PREDICTABILITY OF AMP/PEN/VAN SUSCEPTIBILITY. Performed at [facility], [address] [place], [place] [address]    Report Status 07/17/2024 FINAL  Final  Blood culture (routine x 2)     Status: Abnormal   Collection Time: 07/15/24  9:58 PM   Specimen: BLOOD  Result Value Ref  Range Status   Specimen Description   Final    BLOOD RIGHT ANTECUBITAL Performed at [facility], [address], [place], [place] [address]    Special Requests   Final    BOTTLES DRAWN AEROBIC AND ANAEROBIC Blood Culture adequate volume Performed at [facility], [address] [place], [place] [address]    Culture  Setup Time   Final    GRAM POSITIVE COCCI IN CLUSTERS AEROBIC BOTTLE ONLY CRITICAL RESULT CALLED TO, READ BACK BY AND VERIFIED WITH: PHARMD BLAKE W [id number] FCP    Culture (A)  Final    STAPHYLOCOCCUS EPIDERMIDIS THE SIGNIFICANCE OF ISOLATING THIS ORGANISM FROM A SINGLE SET OF BLOOD CULTURES WHEN MULTIPLE SETS ARE DRAWN IS UNCERTAIN. PLEASE NOTIFY THE MICROBIOLOGY DEPARTMENT WITHIN ONE WEEK IF SPECIATION AND SENSITIVITIES ARE REQUIRED. Performed at [facility], [address], [place], [place] [address]    Report Status 07/18/2024 FINAL  Final  Blood Culture ID Panel (Reflexed)     Status: Abnormal   Collection Time: 07/15/24  9:58 PM  Result Value Ref Range Status   Enterococcus faecalis NOT DETECTED NOT DETECTED Final   Enterococcus Faecium NOT DETECTED NOT DETECTED Final   Listeria monocytogenes NOT DETECTED NOT DETECTED Final   Staphylococcus species DETECTED (A) NOT DETECTED Final    Comment: CRITICAL RESULT CALLED TO, READ BACK BY AND VERIFIED WITH: MAYA FELICIANO ORN [id number] FCP    Staphylococcus aureus (BCID) NOT DETECTED NOT DETECTED Final   Staphylococcus epidermidis DETECTED (A) NOT DETECTED Final    Comment: Methicillin (oxacillin) resistant coagulase negative  staphylococcus. Possible blood culture contaminant (unless isolated from more than one blood culture draw or clinical case suggests pathogenicity). No antibiotic treatment is indicated for blood  culture contaminants. CRITICAL RESULT CALLED TO, READ BACK BY AND VERIFIED WITH: PHARMD BLAKE W [id number] FCP    Staphylococcus lugdunensis NOT DETECTED  NOT DETECTED Final   Streptococcus species NOT DETECTED NOT DETECTED Final   Streptococcus agalactiae NOT DETECTED NOT DETECTED Final   Streptococcus pneumoniae NOT DETECTED NOT DETECTED Final   Streptococcus pyogenes NOT DETECTED NOT DETECTED Final   A.calcoaceticus-baumannii NOT DETECTED NOT DETECTED Final   Bacteroides fragilis NOT DETECTED NOT DETECTED Final   Enterobacterales NOT DETECTED NOT DETECTED Final   Enterobacter cloacae complex NOT DETECTED NOT DETECTED Final   Escherichia coli NOT DETECTED NOT DETECTED Final   Klebsiella aerogenes NOT DETECTED NOT DETECTED Final   Klebsiella oxytoca NOT DETECTED NOT DETECTED Final   Klebsiella pneumoniae NOT DETECTED NOT DETECTED Final   Proteus species NOT DETECTED NOT DETECTED Final   Salmonella species NOT DETECTED NOT DETECTED Final   Serratia marcescens NOT DETECTED NOT DETECTED Final   Haemophilus influenzae NOT DETECTED NOT DETECTED Final   Neisseria meningitidis NOT DETECTED NOT DETECTED Final   Pseudomonas aeruginosa NOT DETECTED NOT DETECTED Final   Stenotrophomonas maltophilia NOT DETECTED NOT DETECTED Final   Candida albicans NOT DETECTED NOT DETECTED Final   Candida auris NOT DETECTED NOT DETECTED Final   Candida glabrata NOT DETECTED NOT DETECTED Final   Candida krusei NOT DETECTED NOT DETECTED Final   Candida parapsilosis NOT DETECTED NOT DETECTED Final   Candida tropicalis NOT DETECTED NOT DETECTED Final   Cryptococcus neoformans/gattii NOT DETECTED NOT DETECTED Final   Methicillin resistance mecA/C DETECTED (A) NOT DETECTED Final    Comment: CRITICAL RESULT CALLED TO, READ BACK BY AND VERIFIED WITH: MAYA FELICIANO ORN [phone] FCP Performed at [facility], [address] [place], [place] [address]   Respiratory (~20 pathogens) panel by PCR     Status: None   Collection Time: 07/16/24  2:25 PM   Specimen: Nasopharyngeal Swab; Respiratory  Result Value Ref Range Status   Adenovirus NOT DETECTED NOT DETECTED  Final   Coronavirus 229E NOT DETECTED NOT DETECTED Final    Comment: (NOTE) The Coronavirus on the Respiratory Panel, DOES NOT test for the novel  Coronavirus (2019 nCoV)    Coronavirus HKU1 NOT DETECTED NOT DETECTED Final   Coronavirus NL63 NOT DETECTED NOT DETECTED Final   Coronavirus OC43 NOT DETECTED NOT DETECTED Final   Metapneumovirus NOT DETECTED NOT DETECTED Final   Rhinovirus / Enterovirus NOT DETECTED NOT DETECTED Final   Influenza A NOT DETECTED NOT DETECTED Final   Influenza B NOT DETECTED NOT DETECTED Final   Parainfluenza Virus 1 NOT DETECTED NOT DETECTED Final   Parainfluenza Virus 2 NOT DETECTED NOT DETECTED Final   Parainfluenza Virus 3 NOT DETECTED NOT DETECTED Final   Parainfluenza Virus 4 NOT DETECTED NOT DETECTED Final   Respiratory Syncytial Virus NOT DETECTED NOT DETECTED Final   Bordetella pertussis NOT DETECTED NOT DETECTED Final   Bordetella Parapertussis NOT DETECTED NOT DETECTED Final   Chlamydophila pneumoniae NOT DETECTED NOT DETECTED Final   Mycoplasma pneumoniae NOT DETECTED NOT DETECTED Final    Comment: Performed at [facility], [address] [place], [place] [address]    Radiology Studies: No results found.     Claudette Wermuth T. Marcellis Zimmerman [facility]  If 7PM-7AM, please contact night-coverage [email or web address] 07/20/2024, 10:35 AM

## 2024-07-20 NOTE — Consult Note (Signed)
 [facility]    Date of Admission:  07/15/2024   Total days of inpatient antibiotics 5        Reason for Consult: Right lower extremity cellulitis    Principal Problem:   Sepsis due to cellulitis [facility]) Active Problems:   Cerebrovascular accident (CVA) (HCC)   Cellulitis of right lower extremity   Class 3 obesity (HCC)   Hyperglycemia due to type 2 diabetes mellitus (HCC)   CHF exacerbation (HCC)   Assessment: 38 year old female with CHF, uncontrolled diabetes admitted with bodyaches found to have right lower extremity cellulitis started on Vanco ceftriaxone  then transition to linezolid and Augmentin  ID engaged as she continues to have right lower extremity pain and swelling #Right lower extremity cellulitis #Bilateral lower extremity swelling right greater than left -Venous ultrasound negative for DVT -Initial leukocytosis 22K, resolved -Blood cultures 10/28 1 out of 2 staph epi, contaminant -CT tib-fib on 10/30 had shown subcutaneous edema and also apply without abscess or gas Recommendations: - Continue linezolid and transition Augmentin  to ceftriaxone . Elevate legs. If no improvement midweek then re-image. I think likely fluid build up and lack of leg elevation is increasing pain/edema at RLE -Communicate plan to primary -New ID service Monday  #Uncontrolled diabetes -A1c 13.5 Microbiology:   Antibiotics: Vancomycin  10/30 Ceftriaxone  10/28-10 30 Linezolid and Augmentin  10/31-present  Cultures: Blood 10/28 1/2 staph epi Urine  Other   HPI: Carolyn Zimmerman is a 38 y.o. female with past medical history of CHF, CVA, diabetes type 2, hypertension, morbid obesity presented with bodyaches including including legs back and stomach.  Upon arrival ecchymosis: Significant right lower extremity cellulitis.  Lower extremity venous Dopplers negative.  Patient started on vancomycin  and ceftriaxone .  She was afebrile on arrival WBC 22.9 K.  Trended  down.  Continues to have right lower extremity swelling ID engaged.  She has remained afebrile without leukocytosis.  Review of Systems: Review of Systems  All other systems reviewed and are negative.   Past Medical History:  Diagnosis Date   Abscess of chest wall 06/15/2017   Coronavirus infection 05/02/2020   CVA (cerebral vascular accident) (HCC) 07/14/2022   L MCA CVA, watershed infarct   Diabetes mellitus    Heart failure with mildly reduced ejection fraction (HFmrEF) (HCC) 09/29/2021   EF 45%, moderate LVH   Hyperlipidemia associated with type 2 diabetes mellitus (HCC)    Hypertension    Jaundice    Obesity    TOA (tubo-ovarian abscess) 09/2019    Social History   Tobacco Use   Smoking status: Former    Current packs/day: 0.00    Types: Cigarettes    Quit date: 10/08/2023    Years since quitting: 0.7   Smokeless tobacco: Never  Vaping Use   Vaping status: Former  Substance Use Topics   Alcohol use: Not Currently    Comment: occ   Drug use: No    Family History  Problem Relation Age of Onset   Hypertension Mother    Diabetes Father    Hypertension Father    Scheduled Meds:  amoxicillin -clavulanate  1 tablet Oral Q12H   aspirin   81 mg Oral Daily   carvedilol   25 mg Oral BID WC   enoxaparin  (LOVENOX ) injection  65 mg Subcutaneous Q24H   gabapentin   300 mg Oral BID   insulin  aspart  0-20 Units Subcutaneous TID WC   insulin  aspart  0-5 Units Subcutaneous QHS   insulin  aspart  10 Units Subcutaneous TID WC   [START ON 07/21/2024] insulin  glargine-yfgn  50 Units Subcutaneous Daily   irbesartan   75 mg Oral Daily   linezolid  600 mg Oral Q12H   rosuvastatin   20 mg Oral Daily   spironolactone   25 mg Oral Daily   torsemide   40 mg Oral Daily   Continuous Infusions: PRN Meds:.acetaminophen  **OR** acetaminophen , HYDROmorphone  (DILAUDID ) injection, ondansetron  (ZOFRAN ) IV, oxyCODONE , polyethylene glycol, senna-docusate Allergies  Allergen Reactions    Prochlorperazine  Anxiety    Panic attacks   Contrast Media [Iodinated Contrast Media] Hives and Swelling    OBJECTIVE: Blood pressure (!) 154/89, pulse 75, temperature 98.2 F (36.8 C), temperature source Oral, resp. rate 20, height 5' 7.5 (1.715 m), weight 135.1 kg, SpO2 98%.  Physical Exam Constitutional:      Appearance: Normal appearance.  HENT:     Head: Normocephalic and atraumatic.     Right Ear: Tympanic membrane normal.     Left Ear: Tympanic membrane normal.     Nose: Nose normal.     Mouth/Throat:     Mouth: Mucous membranes are moist.  Eyes:     Extraocular Movements: Extraocular movements intact.     Conjunctiva/sclera: Conjunctivae normal.     Pupils: Pupils are equal, round, and reactive to light.  Cardiovascular:     Rate and Rhythm: Normal rate and regular rhythm.     Heart sounds: No murmur heard.    No friction rub. No gallop.  Pulmonary:     Effort: Pulmonary effort is normal.     Breath sounds: Normal breath sounds.  Abdominal:     General: Abdomen is flat.     Palpations: Abdomen is soft.  Skin:    General: Skin is warm and dry.  Neurological:     General: No focal deficit present.     Mental Status: She is alert and oriented to person, place, and time.  Psychiatric:        Mood and Affect: Mood normal.     Lab Results Lab Results  Component Value Date   WBC 8.6 07/19/2024   HGB 9.6 (L) 07/19/2024   HCT 30.6 (L) 07/19/2024   MCV 83.4 07/19/2024   PLT 352 07/19/2024    Lab Results  Component Value Date   CREATININE 1.01 (H) 07/19/2024   BUN 11 07/19/2024   NA 135 07/19/2024   K 3.6 07/19/2024   CL 102 07/19/2024   CO2 22 07/19/2024    Lab Results  Component Value Date   ALT 16 07/17/2024   AST 16 07/17/2024   ALKPHOS 66 07/17/2024   BILITOT 0.2 07/17/2024       Loney Stank, MD [facility] 07/20/2024, 12:44 PM

## 2024-07-21 ENCOUNTER — Other Ambulatory Visit (HOSPITAL_COMMUNITY): Payer: Self-pay

## 2024-07-21 ENCOUNTER — Telehealth (HOSPITAL_COMMUNITY): Payer: Self-pay | Admitting: Pharmacy Technician

## 2024-07-21 DIAGNOSIS — L0390 Cellulitis, unspecified: Secondary | ICD-10-CM | POA: Diagnosis not present

## 2024-07-21 DIAGNOSIS — A419 Sepsis, unspecified organism: Secondary | ICD-10-CM | POA: Diagnosis not present

## 2024-07-21 DIAGNOSIS — L03115 Cellulitis of right lower limb: Secondary | ICD-10-CM | POA: Diagnosis not present

## 2024-07-21 LAB — CBC
HCT: 31.6 % — ABNORMAL LOW (ref 36.0–46.0)
Hemoglobin: 10 g/dL — ABNORMAL LOW (ref 12.0–15.0)
MCH: 26 pg (ref 26.0–34.0)
MCHC: 31.6 g/dL (ref 30.0–36.0)
MCV: 82.1 fL (ref 80.0–100.0)
Platelets: 378 K/uL (ref 150–400)
RBC: 3.85 MIL/uL — ABNORMAL LOW (ref 3.87–5.11)
RDW: 13.6 % (ref 11.5–15.5)
WBC: 13.6 K/uL — ABNORMAL HIGH (ref 4.0–10.5)
nRBC: 0 % (ref 0.0–0.2)

## 2024-07-21 LAB — RENAL FUNCTION PANEL
Albumin: 1.9 g/dL — ABNORMAL LOW (ref 3.5–5.0)
Anion gap: 11 (ref 5–15)
BUN: 13 mg/dL (ref 6–20)
CO2: 22 mmol/L (ref 22–32)
Calcium: 8.3 mg/dL — ABNORMAL LOW (ref 8.9–10.3)
Chloride: 102 mmol/L (ref 98–111)
Creatinine, Ser: 0.88 mg/dL (ref 0.44–1.00)
GFR, Estimated: >60 mL/min (ref >60)
Glucose, Bld: 239 mg/dL — ABNORMAL HIGH (ref 70–99)
Phosphorus: 3.6 mg/dL (ref 2.5–4.6)
Potassium: 3.9 mmol/L (ref 3.5–5.1)
Sodium: 135 mmol/L (ref 135–145)

## 2024-07-21 LAB — GLUCOSE, CAPILLARY
Glucose-Capillary: 239 mg/dL — ABNORMAL HIGH (ref 70–99)
Glucose-Capillary: 196 mg/dL — ABNORMAL HIGH (ref 70–99)
Glucose-Capillary: 238 mg/dL — ABNORMAL HIGH (ref 70–99)
Glucose-Capillary: 220 mg/dL — ABNORMAL HIGH (ref 70–99)

## 2024-07-21 LAB — CULTURE, BLOOD (ROUTINE X 2)
Culture: NO GROWTH
Special Requests: ADEQUATE

## 2024-07-21 LAB — MAGNESIUM: Magnesium: 2.2 mg/dL (ref 1.7–2.4)

## 2024-07-21 MED ORDER — IRBESARTAN 300 MG PO TABS
150.0000 mg | ORAL_TABLET | Freq: Every day | ORAL | Status: DC
  Administered 2024-07-22: 150 mg via ORAL
  Filled 2024-07-21: qty 1

## 2024-07-21 MED ORDER — INSULIN GLARGINE-YFGN 100 UNIT/ML ~~LOC~~ SOLN
50.0000 [IU] | Freq: Every day | SUBCUTANEOUS | Status: DC
  Filled 2024-07-21: qty 0.5

## 2024-07-21 MED ORDER — SODIUM CHLORIDE 0.9 % IV SOLN
2.0000 g | INTRAVENOUS | Status: DC
  Administered 2024-07-21: 2 g via INTRAVENOUS
  Filled 2024-07-21: qty 20

## 2024-07-21 MED ORDER — IRBESARTAN 75 MG PO TABS
75.0000 mg | ORAL_TABLET | Freq: Once | ORAL | Status: AC
  Administered 2024-07-21: 75 mg via ORAL
  Filled 2024-07-21: qty 1

## 2024-07-21 MED ORDER — INSULIN GLARGINE-YFGN 100 UNIT/ML ~~LOC~~ SOLN
20.0000 [IU] | Freq: Every day | SUBCUTANEOUS | Status: DC
  Administered 2024-07-21: 20 [IU] via SUBCUTANEOUS
  Filled 2024-07-21 (×2): qty 0.2

## 2024-07-21 MED ORDER — LINEZOLID 600 MG PO TABS
600.0000 mg | ORAL_TABLET | Freq: Two times a day (BID) | ORAL | Status: DC
  Administered 2024-07-21 – 2024-07-22 (×2): 600 mg via ORAL
  Filled 2024-07-21 (×2): qty 1

## 2024-07-21 NOTE — Progress Notes (Signed)
 PROGRESS NOTE  Carolyn Zimmerman FMW:[id number] DOB: 1986/07/12   PCP: Leonarda Roxan BROCKS, NP  Patient is from: Home.  Independently ambulates at baseline.  DOA: 07/15/2024 LOS: 4  Chief complaints Chief Complaint  Patient presents with   Cough     Brief Narrative / Interim history: 38 year old F with PMH of HFmrEF, CVA, IDDM-2, HTN and morbid obesity presented [facility] with generalized body pain including legs, back and stomach as well as some chills and chest congestion.  Initially, admission requested for possible acute CHF.  She was given Lasix , ibuprofen  and ceftriaxone  in ED.   On arrival to [facility], patient with significant RLE cellulitis.  Continued on ceftriaxone .   Blood culture with MRSE in 1 out of 4 bottles.  TTE without significant finding.  LE venous Doppler negative.  Right lower extremity CT suggested cellulitis.  Vancomycin  added.  Cellulitis improved some but still with significant swelling right leg.  De-escalated antibiotics to p.o. Zyvox and Augmentin  on 10/31.  ID consulted and switched Augmentin  back to ceftriaxone .  Subjective: Seen and examined earlier this morning.  No major events overnight or this morning.  Continues to endorse pain and swelling in right leg.  CBG remains elevated.  She reports history of metformin  intolerance due to GI side effects.  Her endocrinologist did want her on GLP-1 agonist due to history of pancreatitis.  Assessment and plan: Sepsis due to RLE cellulitis: Prior history of RLE cellulitis earlier this year. A1c 13.5%.  CK normal. CRP 2.1.  ESR 102. Blood culture with MRSE in 1 out of 4 bottles likely contaminant.  Contrast CT suggested cellulitis with no fluid collection or osseous abnormalities.    Still with significant swelling, pain and tenderness in RLE. -Vancomycin  10/30>> Zyvox 10/31>> -Ceftriaxone  10/28>> Augmentin  10/31>> ceftriaxone  11/3>> -Pain control and leg elevation -Continue diuretics.  Hold  amlodipine . -Glycemic control as below. -Appreciate guidance by ID. -PT eval-no need identified.   Bilateral lower extremity edema/pain: Has BLE edema.  Unclear how much of this is acute.  She is on amlodipine  10 mg daily which could contribute.  Seems like she also has neuropathic pain from poorly controlled diabetes.  Lower extremity venous Doppler negative for DVT.  Still with significant edema in RLE. -Continue holding amlodipine  -Antibiotics as above. -Continue home torsemide  and Aldactone . -Fluid restriction to 1500 cc a day. -Continue low-dose gabapentin .   Chronic HFimpEF: TTE normal (LVEF of 40 to 45% GH G1DD in 10/2023).  She has BLE edema.  proBNP slightly elevated.  Inconsistently uses torsemide  at home.  Net -8 L. -Continue home torsemide , Aldactone  and Coreg . -Start fluid restriction to 1500 cc an hour, daily weight, renal functions and electrolyte   Poorly controlled IDDM-2 with hyperglycemia: A1c 13.5%.  Reports using Lantus  30 units at bedtime and Humalog  16 units 3 times daily before meals.  Hyperglycemia likely due to dietary indiscretion.  Reports metformin  intolerance.  Her endocrinologist did want her on GLP-1 agonist due to history of pancreatitis. Recent Labs  Lab 07/20/24 0607 07/20/24 1058 07/20/24 1628 07/20/24 2108 07/21/24 0617  GLUCAP 165* 245* 187* 211* 239*  -Continue Semglee  50 units daily.  Add 20 units at night. -Continue NovoLog  10 units 3 times daily with meals -Continue resistant scale insulin  -Advised to avoid foods from outside.  -Appreciate help by diabetic coordinator. -Further adjustment as appropriate   Chest congestion: No history of asthma or COPD.  She has no URI symptoms.  proBNP slightly elevated.  Received IV Lasix  in  ED. TTE reassuring.  RVP negative.  Seems to have resolved. - Continue diuretics as above.  Elevated troponin: Mild without significant delta.  Patient has no chest pain.  Likely demand ischemia.  Recent coronary CTA  nondiagnostic.  TTE reassuring. - Continue home Coreg , started on aspirin . -Started Avapro  in house.   History of CVA: No focal neurodeficit. - Continue started on aspirin .   Essential hypertension: BP was elevated but improved.  On amlodipine , Coreg , Aldactone , torsemide  -Continue home Coreg , torsemide  and Aldactone . -Increase Avapro  to 150 mg daily -Hold amlodipine  given BLE edema   Hepatomegaly: Noted on CT.  Fatty liver? -Outpatient follow-up with GI   Bilateral inguinal lymphadenopathy: Likely reactive from cellulitis. - Consider repeat CT once cellulitis fully treated  Enlarged thyroid gland: Low TSH but free T4 suggesting euthyroid sick syndrome. -Recheck thyroid panel in 4 to 6 weeks. -Nonemergent thyroid ultrasound outpatient.   Hypokalemia -Monitor replenish K and Mg as appropriate   Morbid obesity Body mass index is 45.96 kg/m. - Encourage lifestyle change to lose weight         DVT prophylaxis:  On subcu Lovenox .  Code Status: Full code Family Communication: None at the bedside Level of care: Telemetry Status is: Inpatient Remains inpatient appropriate because: Sepsis due to cellulitis   Final disposition: Likely home once medically stable   55 minutes with more than 50% spent in reviewing records, counseling patient/family and coordinating care.  Consultants:  Infectious disease  Procedures: None  Microbiology summarized: Blood culture with Staph epidermidis in 1 out of 4 bottles Urine culture with 10,000 colonies of Streptococcus agalactiae  Objective: Vitals:   07/20/24 1929 07/20/24 2337 07/21/24 0429 07/21/24 0800  BP: (!) 158/87 (!) 162/74 (!) 152/106 (!) 154/91  Pulse:    79  Resp: 19 17 18 18   Temp: 97.6 F (36.4 C) 98.1 F (36.7 C) 98.2 F (36.8 C) 98.9 F (37.2 C)  TempSrc: Oral Oral Oral Oral  SpO2: 100% 98% 99% 97%  Weight:      Height:        Examination:  GENERAL: No acute distress.  Appears well.  HEENT: MMM.   Vision and hearing grossly intact.  NECK: Supple.  No apparent JVD.  RESP:  No IWOB. Good air movement bilaterally. CVS:  RRR. Heart sounds normal.  ABD/GI/GU: Bowel sounds present. Soft. Non tender.  No CVA tenderness. MSK/EXT:  Moves extremities.  RLE erythema, swelling and tenderness. SKIN: As above. NEURO: Awake, alert and oriented appropriately.  No gross deficit.  PSYCH: Calm. Normal affect.    Sch Meds:  Scheduled Meds:  aspirin   81 mg Oral Daily   carvedilol   25 mg Oral BID WC   enoxaparin  (LOVENOX ) injection  65 mg Subcutaneous Q24H   gabapentin   300 mg Oral BID   insulin  aspart  0-20 Units Subcutaneous TID WC   insulin  aspart  0-5 Units Subcutaneous QHS   insulin  aspart  10 Units Subcutaneous TID WC   [START ON 07/22/2024] insulin  glargine-yfgn  50 Units Subcutaneous Daily   And   insulin  glargine-yfgn  20 Units Subcutaneous QHS   irbesartan   75 mg Oral Once   Followed by   [START ON 07/22/2024] irbesartan   150 mg Oral Daily   linezolid  600 mg Oral Q12H   rosuvastatin   20 mg Oral Daily   spironolactone   50 mg Oral Daily   torsemide   40 mg Oral Daily   Continuous Infusions:  cefTRIAXone  (ROCEPHIN )  IV 2 g (07/21/24 0825)  PRN Meds:.acetaminophen  **OR** acetaminophen , HYDROmorphone  (DILAUDID ) injection, ondansetron  (ZOFRAN ) IV, oxyCODONE , polyethylene glycol, senna-docusate  Antimicrobials: Anti-infectives (From admission, onward)    Start     Dose/Rate Route Frequency Ordered Stop   07/21/24 0815  cefTRIAXone  (ROCEPHIN ) 2 g in sodium chloride  0.9 % 100 mL IVPB        2 g 200 mL/hr over 30 Minutes Intravenous Every 24 hours 07/21/24 0724     07/18/24 1045  linezolid (ZYVOX) tablet 600 mg        600 mg Oral Every 12 hours 07/18/24 0954 07/24/24 2359   07/18/24 1045  amoxicillin -clavulanate (AUGMENTIN ) 875-125 MG per tablet 1 tablet  Status:  Discontinued        1 tablet Oral Every 12 hours 07/18/24 0954 07/20/24 1732   07/17/24 2200  vancomycin  (VANCOREADY)  IVPB 750 mg/150 mL  Status:  Discontinued        750 mg 150 mL/hr over 60 Minutes Intravenous Every 8 hours 07/17/24 1222 07/18/24 0954   07/17/24 1315  vancomycin  (VANCOCIN ) 2,500 mg in sodium chloride  0.9 % 500 mL IVPB        2,500 mg 262.5 mL/hr over 120 Minutes Intravenous  Once 07/17/24 1222 07/17/24 1611   07/17/24 1215  vancomycin  (VANCOREADY) IVPB 750 mg/150 mL  Status:  Discontinued        750 mg 150 mL/hr over 60 Minutes Intravenous Every 8 hours 07/17/24 1119 07/17/24 1222   07/16/24 1730  cefTRIAXone  (ROCEPHIN ) 2 g in sodium chloride  0.9 % 100 mL IVPB  Status:  Discontinued        2 g 200 mL/hr over 30 Minutes Intravenous Every 24 hours 07/16/24 1634 07/18/24 0954   07/15/24 2130  cefTRIAXone  (ROCEPHIN ) 2 g in sodium chloride  0.9 % 100 mL IVPB        2 g 200 mL/hr over 30 Minutes Intravenous  Once 07/15/24 2129 07/16/24 0024        I have personally reviewed the following labs and images: CBC: Recent Labs  Lab 07/15/24 1803 07/16/24 1508 07/17/24 0301 07/18/24 0239 07/19/24 0655 07/20/24 1101 07/21/24 0556  WBC 22.9*   < > 12.9* 8.8 8.6 10.6* 13.6*  NEUTROABS 20.3*  --   --   --   --   --   --   HGB 11.0*   < > 9.5* 9.6* 9.6* 9.5* 10.0*  HCT 32.7*   < > 28.8* 30.7* 30.6* 30.4* 31.6*  MCV 79.4*   < > 80.4 83.4 83.4 81.9 82.1  PLT 349   < > 306 305 352 377 378   < > = values in this interval not displayed.   BMP &GFR Recent Labs  Lab 07/17/24 0301 07/18/24 0239 07/19/24 0655 07/20/24 1101 07/21/24 0556  NA 133* 137 135 137 135  K 3.7 3.9 3.6 3.4* 3.9  CL 100 101 102 101 102  CO2 23 24 22 26 22   GLUCOSE 248* 313* 295* 253* 239*  BUN 10 12 11 11 13   CREATININE 0.98 1.01* 1.01* 0.89 0.88  CALCIUM  7.8* 8.1* 8.2* 8.4* 8.3*  MG 1.7 2.4 1.8 1.6* 2.2  PHOS  --  3.5 3.5 4.3 3.6   Estimated Creatinine Clearance: 127.9 mL/min (by C-G formula based on SCr of 0.88 mg/dL). Liver & Pancreas: Recent Labs  Lab 07/15/24 1803 07/16/24 1508 07/17/24 0301  07/18/24 0239 07/19/24 0655 07/20/24 1101 07/21/24 0556  AST 19 19 16   --   --   --   --  ALT 13 15 16   --   --   --   --   ALKPHOS 94 64 66  --   --   --   --   BILITOT 0.5 0.4 0.2  --   --   --   --   PROT 6.7 5.9* 5.6*  --   --   --   --   ALBUMIN 3.2* 1.9* 1.8* 1.8* 1.8* 1.9* 1.9*   No results for input(s): LIPASE, AMYLASE in the last 168 hours. No results for input(s): AMMONIA in the last 168 hours. Diabetic: No results for input(s): HGBA1C in the last 72 hours.  Recent Labs  Lab 07/20/24 0607 07/20/24 1058 07/20/24 1628 07/20/24 2108 07/21/24 0617  GLUCAP 165* 245* 187* 211* 239*   Cardiac Enzymes: Recent Labs  Lab 07/16/24 1508 07/20/24 1101  CKTOTAL 215 105   Recent Labs    02/01/24 2319 07/15/24 1803  PROBNP 353.0* 1,386.0*   Coagulation Profile: No results for input(s): INR, PROTIME in the last 168 hours. Thyroid Function Tests: No results for input(s): TSH, T4TOTAL, FREET4, T3FREE, THYROIDAB in the last 72 hours.  Lipid Profile: No results for input(s): CHOL, HDL, LDLCALC, TRIG, CHOLHDL, LDLDIRECT in the last 72 hours. Anemia Panel: No results for input(s): VITAMINB12, FOLATE, FERRITIN, TIBC, IRON, RETICCTPCT in the last 72 hours. Urine analysis:    Component Value Date/Time   COLORURINE YELLOW 07/15/2024 1857   APPEARANCEUR CLEAR 07/15/2024 1857   LABSPEC 1.007 07/15/2024 1857   PHURINE 6.5 07/15/2024 1857   GLUCOSEU 500 (A) 07/15/2024 1857   HGBUR SMALL (A) 07/15/2024 1857   BILIRUBINUR NEGATIVE 07/15/2024 1857   BILIRUBINUR Negative 05/14/2024 1205   KETONESUR NEGATIVE 07/15/2024 1857   PROTEINUR 100 (A) 07/15/2024 1857   UROBILINOGEN negative (A) 05/14/2024 1205   UROBILINOGEN 1.0 01/19/2012 0015   NITRITE NEGATIVE 07/15/2024 1857   LEUKOCYTESUR SMALL (A) 07/15/2024 1857   Sepsis Labs: Invalid input(s): PROCALCITONIN, LACTICIDVEN  Microbiology: Recent Results (from the past 240  hours)  Blood culture (routine x 2)     Status: None   Collection Time: 07/15/24  1:24 AM   Specimen: BLOOD LEFT HAND  Result Value Ref Range Status   Specimen Description   Final    BLOOD LEFT HAND Performed at [facility], [address] [place], [place] [address]    Special Requests   Final    BOTTLES DRAWN AEROBIC AND ANAEROBIC Blood Culture adequate volume Performed at [facility], [address], [place], [place] [address]    Culture   Final    NO GROWTH 5 DAYS Performed at [facility], [address] [place], [place] [address]    Report Status 07/21/2024 FINAL  Final  Resp panel by RT-PCR (RSV, Flu A&B, Covid) Anterior Nasal Swab     Status: None   Collection Time: 07/15/24  2:59 PM   Specimen: Anterior Nasal Swab  Result Value Ref Range Status   SARS Coronavirus 2 by RT PCR NEGATIVE NEGATIVE Final    Comment: (NOTE) SARS-CoV-2 target nucleic acids are NOT DETECTED.  The SARS-CoV-2 RNA is generally detectable in upper respiratory specimens during the acute phase of infection. The lowest concentration of SARS-CoV-2 viral copies this assay can detect is 138 copies/mL. A negative result does not preclude SARS-Cov-2 infection and should not be used as the sole basis for treatment or other patient management decisions. A negative result may occur with  improper specimen collection/handling, submission of specimen other  than nasopharyngeal swab, presence of viral mutation(s) within the areas targeted by this assay, and inadequate number of viral copies(<138 copies/mL). A negative result must be combined with clinical observations, patient history, and epidemiological information. The expected result is Negative.  Fact Sheet for Patients:  bloggercourse.com  Fact Sheet for Healthcare Providers:  seriousbroker.it  This test is no t yet approved or cleared by the [place]  [place] FDA and  has been authorized for detection and/or diagnosis of SARS-CoV-2 by FDA under an Emergency Use Authorization (EUA). This EUA will remain  in effect (meaning this test can be used) for the duration of the COVID-19 declaration under Section 564(b)(1) of the Act, 21 U.S.C.section 360bbb-3(b)(1), unless the authorization is terminated  or revoked sooner.       Influenza A by PCR NEGATIVE NEGATIVE Final   Influenza B by PCR NEGATIVE NEGATIVE Final    Comment: (NOTE) The Xpert Xpress SARS-CoV-2/FLU/RSV plus assay is intended as an aid in the diagnosis of influenza from Nasopharyngeal swab specimens and should not be used as a sole basis for treatment. Nasal washings and aspirates are unacceptable for Xpert Xpress SARS-CoV-2/FLU/RSV testing.  Fact Sheet for Patients: bloggercourse.com  Fact Sheet for Healthcare Providers: seriousbroker.it  This test is not yet approved or cleared by the [place]  FDA and has been authorized for detection and/or diagnosis of SARS-CoV-2 by FDA under an Emergency Use Authorization (EUA). This EUA will remain in effect (meaning this test can be used) for the duration of the COVID-19 declaration under Section 564(b)(1) of the Act, 21 U.S.C. section 360bbb-3(b)(1), unless the authorization is terminated or revoked.     Resp Syncytial Virus by PCR NEGATIVE NEGATIVE Final    Comment: (NOTE) Fact Sheet for Patients: bloggercourse.com  Fact Sheet for Healthcare Providers: seriousbroker.it  This test is not yet approved or cleared by the [place]  FDA and has been authorized for detection and/or diagnosis of SARS-CoV-2 by FDA under an Emergency Use Authorization (EUA). This EUA will remain in effect (meaning this test can be used) for the duration of the COVID-19 declaration under Section 564(b)(1) of the Act, 21 U.S.C. section  360bbb-3(b)(1), unless the authorization is terminated or revoked.  Performed at [facility], [address], [place], [place] [address]   Urine Culture     Status: Abnormal   Collection Time: 07/15/24  9:30 PM   Specimen: Urine, Clean Catch  Result Value Ref Range Status   Specimen Description   Final    URINE, CLEAN CATCH Performed at [facility], [address] [place], [place] [address]    Special Requests   Final    NONE Performed at [facility], [address] [place], [place] [address]    Culture (A)  Final    10,000 COLONIES/mL GROUP B STREP(S.AGALACTIAE)ISOLATED TESTING AGAINST S. AGALACTIAE NOT ROUTINELY PERFORMED DUE TO PREDICTABILITY OF AMP/PEN/VAN SUSCEPTIBILITY. Performed at [facility], [address] [place], [place] [address]    Report Status 07/17/2024 FINAL  Final  Blood culture (routine x 2)     Status: Abnormal   Collection Time: 07/15/24  9:58 PM   Specimen: BLOOD  Result Value Ref Range Status   Specimen Description   Final    BLOOD RIGHT ANTECUBITAL Performed at [facility], [address], [place], [place] [address]    Special Requests   Final    BOTTLES DRAWN AEROBIC AND ANAEROBIC Blood Culture adequate volume Performed at [facility], [phone]  [address], [place], [place] [address]    Culture  Setup Time   Final    GRAM POSITIVE COCCI IN CLUSTERS AEROBIC BOTTLE ONLY CRITICAL RESULT CALLED TO, READ BACK BY AND VERIFIED WITH: PHARMD BLAKE W [id number] FCP    Culture (A)  Final    STAPHYLOCOCCUS EPIDERMIDIS THE SIGNIFICANCE OF ISOLATING THIS ORGANISM FROM A SINGLE SET OF BLOOD CULTURES WHEN MULTIPLE SETS ARE DRAWN IS UNCERTAIN. PLEASE NOTIFY THE MICROBIOLOGY DEPARTMENT WITHIN ONE WEEK IF SPECIATION AND SENSITIVITIES ARE REQUIRED. Performed at [facility], [address] [place], [place] [address]    Report Status 07/18/2024 FINAL   Final  Blood Culture ID Panel (Reflexed)     Status: Abnormal   Collection Time: 07/15/24  9:58 PM  Result Value Ref Range Status   Enterococcus faecalis NOT DETECTED NOT DETECTED Final   Enterococcus Faecium NOT DETECTED NOT DETECTED Final   Listeria monocytogenes NOT DETECTED NOT DETECTED Final   Staphylococcus species DETECTED (A) NOT DETECTED Final    Comment: CRITICAL RESULT CALLED TO, READ BACK BY AND VERIFIED WITH: MAYA FELICIANO ORN [id number] FCP    Staphylococcus aureus (BCID) NOT DETECTED NOT DETECTED Final   Staphylococcus epidermidis DETECTED (A) NOT DETECTED Final    Comment: Methicillin (oxacillin) resistant coagulase negative staphylococcus. Possible blood culture contaminant (unless isolated from more than one blood culture draw or clinical case suggests pathogenicity). No antibiotic treatment is indicated for blood  culture contaminants. CRITICAL RESULT CALLED TO, READ BACK BY AND VERIFIED WITH: PHARMD BLAKE W [id number] FCP    Staphylococcus lugdunensis NOT DETECTED NOT DETECTED Final   Streptococcus species NOT DETECTED NOT DETECTED Final   Streptococcus agalactiae NOT DETECTED NOT DETECTED Final   Streptococcus pneumoniae NOT DETECTED NOT DETECTED Final   Streptococcus pyogenes NOT DETECTED NOT DETECTED Final   A.calcoaceticus-baumannii NOT DETECTED NOT DETECTED Final   Bacteroides fragilis NOT DETECTED NOT DETECTED Final   Enterobacterales NOT DETECTED NOT DETECTED Final   Enterobacter cloacae complex NOT DETECTED NOT DETECTED Final   Escherichia coli NOT DETECTED NOT DETECTED Final   Klebsiella aerogenes NOT DETECTED NOT DETECTED Final   Klebsiella oxytoca NOT DETECTED NOT DETECTED Final   Klebsiella pneumoniae NOT DETECTED NOT DETECTED Final   Proteus species NOT DETECTED NOT DETECTED Final   Salmonella species NOT DETECTED NOT DETECTED Final   Serratia marcescens NOT DETECTED NOT DETECTED Final   Haemophilus influenzae NOT DETECTED NOT DETECTED Final    Neisseria meningitidis NOT DETECTED NOT DETECTED Final   Pseudomonas aeruginosa NOT DETECTED NOT DETECTED Final   Stenotrophomonas maltophilia NOT DETECTED NOT DETECTED Final   Candida albicans NOT DETECTED NOT DETECTED Final   Candida auris NOT DETECTED NOT DETECTED Final   Candida glabrata NOT DETECTED NOT DETECTED Final   Candida krusei NOT DETECTED NOT DETECTED Final   Candida parapsilosis NOT DETECTED NOT DETECTED Final   Candida tropicalis NOT DETECTED NOT DETECTED Final   Cryptococcus neoformans/gattii NOT DETECTED NOT DETECTED Final   Methicillin resistance mecA/C DETECTED (A) NOT DETECTED Final    Comment: CRITICAL RESULT CALLED TO, READ BACK BY AND VERIFIED WITH: MAYA FELICIANO ORN [phone] FCP Performed at [facility], [address] [place], [place] [address]   Respiratory (~20 pathogens) panel by PCR     Status: None   Collection Time: 07/16/24  2:25 PM   Specimen: Nasopharyngeal Swab; Respiratory  Result Value Ref Range Status   Adenovirus NOT DETECTED NOT DETECTED Final   Coronavirus 229E  NOT DETECTED NOT DETECTED Final    Comment: (NOTE) The Coronavirus on the Respiratory Panel, DOES NOT test for the novel  Coronavirus (2019 nCoV)    Coronavirus HKU1 NOT DETECTED NOT DETECTED Final   Coronavirus NL63 NOT DETECTED NOT DETECTED Final   Coronavirus OC43 NOT DETECTED NOT DETECTED Final   Metapneumovirus NOT DETECTED NOT DETECTED Final   Rhinovirus / Enterovirus NOT DETECTED NOT DETECTED Final   Influenza A NOT DETECTED NOT DETECTED Final   Influenza B NOT DETECTED NOT DETECTED Final   Parainfluenza Virus 1 NOT DETECTED NOT DETECTED Final   Parainfluenza Virus 2 NOT DETECTED NOT DETECTED Final   Parainfluenza Virus 3 NOT DETECTED NOT DETECTED Final   Parainfluenza Virus 4 NOT DETECTED NOT DETECTED Final   Respiratory Syncytial Virus NOT DETECTED NOT DETECTED Final   Bordetella pertussis NOT DETECTED NOT DETECTED Final   Bordetella Parapertussis NOT DETECTED NOT  DETECTED Final   Chlamydophila pneumoniae NOT DETECTED NOT DETECTED Final   Mycoplasma pneumoniae NOT DETECTED NOT DETECTED Final    Comment: Performed at [facility], [address], [place], [place] [address]    Radiology Studies: No results found.     Allesandra Huebsch T. Rudi Bunyard [facility]  If 7PM-7AM, please contact night-coverage [email or web address] 07/21/2024, 10:27 AM

## 2024-07-21 NOTE — Progress Notes (Signed)
 Mobility Specialist Progress Note:    07/21/24 1100  Mobility  Activity Ambulated with assistance  Level of Assistance Standby assist, set-up cues, supervision of patient - no hands on  Assistive Device Other (Comment) (IV Pole)  Distance Ambulated (ft) 100 ft  Activity Response Tolerated fair;RN notified  Mobility Referral Yes  Mobility visit 1 Mobility  Mobility Specialist Start Time (ACUTE ONLY) 1100  Mobility Specialist Stop Time (ACUTE ONLY) 1119  Mobility Specialist Time Calculation (min) (ACUTE ONLY) 19 min   Received pt sitting in bed w/ NT in room agreeable to session. Pt originally c/o right leg pain but as the session went on. Started to feel more nauseous and dizzy. Otherwise pt moving and ambulating well. Returned pt to room w/ all needs met.   Venetia Keel Mobility Specialist Please Neurosurgeon or Rehab Office at [phone]

## 2024-07-21 NOTE — Progress Notes (Signed)
 ID was consulted for ongoing cellulitis. Broaden augmentin  back to ceftriaxone . D/w Dr. Gonfa, will add ceftriaxone  order back.   Sergio Batch, PharmD, BCIDP, AAHIVP, CPP Infectious Disease Pharmacist 07/21/2024 7:31 AM

## 2024-07-21 NOTE — Progress Notes (Signed)
 [facility]  Date of Admission:  07/15/2024      Total days of antibiotics 7  Linezolid 10/31           ASSESSMENT: Carolyn Zimmerman is a 38 y.o. female admitted with:   RLE Cellulitis -  Chronic LE swelling -  Venous U/S negative for DVT and CT scan 10/30 w/o any drainable foci noted. No gas. On exam today she has uniform skin color and erythema has seemed to resolve. No rash/blisters to complicate this problem. Main concern for her today is the pain and the swelling that is left.  ?more erysipelas to explain more significant pain to touch on posterior leg.   Sounds like this may have started from home pedicure where she had a cut on the foot.  I think compression stockings would be a great idea for her to control swelling in an effort to avoid further episodes of cellulitis. Could start with ace wraps around foot/ankle up to knee to adjust the compression/comfort but she is open to trying them again if it helps.  She has a hard time with torsemide  as it makes her urinate too much.  - Elevate legs above heart to help swelling.  - compression stockings once able to put them on relatively comfortably - will convert to linezolid PO to complete 10 days total.  - will need scheduled with meals and maybe even prophylactic antiemetic   Diabetes, Uncontrolled -  She expresses frustration about difficult to manage DM. Has had CGMs in the past.  This + chronic swelling will increase risk for relapse in cellulitis unfortunately.   Medication monitoring -  Going forward linezolid may be good choice for her outpatient however would keep use < 10 days to avoid toxic s/e that can be seen with longer durations of use.  Cefadroxil  1000 mg BID would also likely be effective.   ID will sign off - please call back with any questions/concerns or if we can be of further assistance.    PLAN: - Elevate legs above heart to help swelling.  - compression stockings once  able to put them on relatively comfortably - will convert to linezolid PO to complete 10 days total.  - will need scheduled with meals and maybe even prophylactic antiemetic   Principal Problem:   Sepsis due to cellulitis [facility]) Active Problems:   Cerebrovascular accident (CVA) (HCC)   Cellulitis of right lower extremity   Class 3 obesity (HCC)   Hyperglycemia due to type 2 diabetes mellitus (HCC)   CHF exacerbation (HCC)    aspirin   81 mg Oral Daily   carvedilol   25 mg Oral BID WC   enoxaparin  (LOVENOX ) injection  65 mg Subcutaneous Q24H   gabapentin   300 mg Oral BID   insulin  aspart  0-20 Units Subcutaneous TID WC   insulin  aspart  0-5 Units Subcutaneous QHS   insulin  aspart  10 Units Subcutaneous TID WC   [START ON 07/22/2024] insulin  glargine-yfgn  50 Units Subcutaneous Daily   And   insulin  glargine-yfgn  20 Units Subcutaneous QHS   irbesartan   75 mg Oral Once   Followed by   [START ON 07/22/2024] irbesartan   150 mg Oral Daily   linezolid  600 mg Oral BID WC   rosuvastatin   20 mg Oral Daily   spironolactone   50 mg Oral Daily   torsemide   40 mg Oral Daily    SUBJECTIVE: Feels the redness is  better and swelling is improving slowly.  No fevers/chills Some nausea with the medication   Review of Systems: Review of Systems  Constitutional:  Negative for chills and fever.  Gastrointestinal:  Positive for nausea. Negative for abdominal pain, diarrhea and vomiting.  Musculoskeletal:        Leg pain with cellulitis to right posterior plane     Allergies  Allergen Reactions   Prochlorperazine  Anxiety    Panic attacks   Contrast Media [Iodinated Contrast Media] Hives and Swelling    OBJECTIVE: Vitals:   07/20/24 1929 07/20/24 2337 07/21/24 0429 07/21/24 0800  BP: (!) 158/87 (!) 162/74 (!) 152/106 (!) 154/91  Pulse:    79  Resp: 19 17 18 18   Temp: 97.6 F (36.4 C) 98.1 F (36.7 C) 98.2 F (36.8 C) 98.9 F (37.2 C)  TempSrc: Oral Oral Oral Oral  SpO2: 100% 98% 99%  97%  Weight:      Height:       Body mass index is 45.96 kg/m.  Physical Exam Pulmonary:     Effort: Pulmonary effort is normal.     Comments: No shortness of breath detected in conversation.  Musculoskeletal:        General: Swelling and tenderness present.  Skin:    General: Skin is warm and dry.     Capillary Refill: Capillary refill takes less than 2 seconds.     Findings: No lesion or rash.  Neurological:     Mental Status: She is oriented to person, place, and time.  Psychiatric:        Mood and Affect: Mood normal.        Behavior: Behavior normal.        Thought Content: Thought content normal.        Judgment: Judgment normal.     Lab Results Lab Results  Component Value Date   WBC 13.6 (H) 07/21/2024   HGB 10.0 (L) 07/21/2024   HCT 31.6 (L) 07/21/2024   MCV 82.1 07/21/2024   PLT 378 07/21/2024    Lab Results  Component Value Date   CREATININE 0.88 07/21/2024   BUN 13 07/21/2024   NA 135 07/21/2024   K 3.9 07/21/2024   CL 102 07/21/2024   CO2 22 07/21/2024    Lab Results  Component Value Date   ALT 16 07/17/2024   AST 16 07/17/2024   ALKPHOS 66 07/17/2024   BILITOT 0.2 07/17/2024     Microbiology: Recent Results (from the past 240 hours)  Blood culture (routine x 2)     Status: None   Collection Time: 07/15/24  1:24 AM   Specimen: BLOOD LEFT HAND  Result Value Ref Range Status   Specimen Description   Final    BLOOD LEFT HAND Performed at [facility], [address] [place], [place] [address]    Special Requests   Final    BOTTLES DRAWN AEROBIC AND ANAEROBIC Blood Culture adequate volume Performed at [facility], [address], [place], [place] [address]    Culture   Final    NO GROWTH 5 DAYS Performed at [facility], [address] [place], [place] [address]    Report Status 07/21/2024 FINAL  Final  Resp panel by RT-PCR (RSV, Flu A&B, Covid) Anterior Nasal Swab     Status: None    Collection Time: 07/15/24  2:59 PM   Specimen: Anterior Nasal Swab  Result Value Ref Range Status   SARS Coronavirus 2 by RT  PCR NEGATIVE NEGATIVE Final    Comment: (NOTE) SARS-CoV-2 target nucleic acids are NOT DETECTED.  The SARS-CoV-2 RNA is generally detectable in upper respiratory specimens during the acute phase of infection. The lowest concentration of SARS-CoV-2 viral copies this assay can detect is 138 copies/mL. A negative result does not preclude SARS-Cov-2 infection and should not be used as the sole basis for treatment or other patient management decisions. A negative result may occur with  improper specimen collection/handling, submission of specimen other than nasopharyngeal swab, presence of viral mutation(s) within the areas targeted by this assay, and inadequate number of viral copies(<138 copies/mL). A negative result must be combined with clinical observations, patient history, and epidemiological information. The expected result is Negative.  Fact Sheet for Patients:  bloggercourse.com  Fact Sheet for Healthcare Providers:  seriousbroker.it  This test is no t yet approved or cleared by the [place]  FDA and  has been authorized for detection and/or diagnosis of SARS-CoV-2 by FDA under an Emergency Use Authorization (EUA). This EUA will remain  in effect (meaning this test can be used) for the duration of the COVID-19 declaration under Section 564(b)(1) of the Act, 21 U.S.C.section 360bbb-3(b)(1), unless the authorization is terminated  or revoked sooner.       Influenza A by PCR NEGATIVE NEGATIVE Final   Influenza B by PCR NEGATIVE NEGATIVE Final    Comment: (NOTE) The Xpert Xpress SARS-CoV-2/FLU/RSV plus assay is intended as an aid in the diagnosis of influenza from Nasopharyngeal swab specimens and should not be used as a sole basis for treatment. Nasal washings and aspirates are unacceptable for  Xpert Xpress SARS-CoV-2/FLU/RSV testing.  Fact Sheet for Patients: bloggercourse.com  Fact Sheet for Healthcare Providers: seriousbroker.it  This test is not yet approved or cleared by the [place]  FDA and has been authorized for detection and/or diagnosis of SARS-CoV-2 by FDA under an Emergency Use Authorization (EUA). This EUA will remain in effect (meaning this test can be used) for the duration of the COVID-19 declaration under Section 564(b)(1) of the Act, 21 U.S.C. section 360bbb-3(b)(1), unless the authorization is terminated or revoked.     Resp Syncytial Virus by PCR NEGATIVE NEGATIVE Final    Comment: (NOTE) Fact Sheet for Patients: bloggercourse.com  Fact Sheet for Healthcare Providers: seriousbroker.it  This test is not yet approved or cleared by the [place]  FDA and has been authorized for detection and/or diagnosis of SARS-CoV-2 by FDA under an Emergency Use Authorization (EUA). This EUA will remain in effect (meaning this test can be used) for the duration of the COVID-19 declaration under Section 564(b)(1) of the Act, 21 U.S.C. section 360bbb-3(b)(1), unless the authorization is terminated or revoked.  Performed at [facility], [address] [place], [place] [address]   Urine Culture     Status: Abnormal   Collection Time: 07/15/24  9:30 PM   Specimen: Urine, Clean Catch  Result Value Ref Range Status   Specimen Description   Final    URINE, CLEAN CATCH Performed at [facility], [address] [place], [place] [address]    Special Requests   Final    NONE Performed at [facility], [address] [place], [place] [address]    Culture (A)  Final    10,000 COLONIES/mL GROUP B STREP(S.AGALACTIAE)ISOLATED TESTING AGAINST S. AGALACTIAE NOT ROUTINELY PERFORMED DUE TO PREDICTABILITY OF  AMP/PEN/VAN SUSCEPTIBILITY. Performed at [facility], [address] [place], [place] [address]    Report Status 07/17/2024  FINAL  Final  Blood culture (routine x 2)     Status: Abnormal   Collection Time: 07/15/24  9:58 PM   Specimen: BLOOD  Result Value Ref Range Status   Specimen Description   Final    BLOOD RIGHT ANTECUBITAL Performed at [facility], [address] [place], [place] [address]    Special Requests   Final    BOTTLES DRAWN AEROBIC AND ANAEROBIC Blood Culture adequate volume Performed at [facility], [address] [place], [place] [address]    Culture  Setup Time   Final    GRAM POSITIVE COCCI IN CLUSTERS AEROBIC BOTTLE ONLY CRITICAL RESULT CALLED TO, READ BACK BY AND VERIFIED WITH: PHARMD BLAKE W [id number] FCP    Culture (A)  Final    STAPHYLOCOCCUS EPIDERMIDIS THE SIGNIFICANCE OF ISOLATING THIS ORGANISM FROM A SINGLE SET OF BLOOD CULTURES WHEN MULTIPLE SETS ARE DRAWN IS UNCERTAIN. PLEASE NOTIFY THE MICROBIOLOGY DEPARTMENT WITHIN ONE WEEK IF SPECIATION AND SENSITIVITIES ARE REQUIRED. Performed at [facility], [address], [place], [place] [address]    Report Status 07/18/2024 FINAL  Final  Blood Culture ID Panel (Reflexed)     Status: Abnormal   Collection Time: 07/15/24  9:58 PM  Result Value Ref Range Status   Enterococcus faecalis NOT DETECTED NOT DETECTED Final   Enterococcus Faecium NOT DETECTED NOT DETECTED Final   Listeria monocytogenes NOT DETECTED NOT DETECTED Final   Staphylococcus species DETECTED (A) NOT DETECTED Final    Comment: CRITICAL RESULT CALLED TO, READ BACK BY AND VERIFIED WITH: MAYA FELICIANO ORN [id number] FCP    Staphylococcus aureus (BCID) NOT DETECTED NOT DETECTED Final   Staphylococcus epidermidis DETECTED (A) NOT DETECTED Final    Comment: Methicillin (oxacillin) resistant coagulase negative staphylococcus. Possible blood culture contaminant (unless isolated from more  than one blood culture draw or clinical case suggests pathogenicity). No antibiotic treatment is indicated for blood  culture contaminants. CRITICAL RESULT CALLED TO, READ BACK BY AND VERIFIED WITH: PHARMD BLAKE W [id number] FCP    Staphylococcus lugdunensis NOT DETECTED NOT DETECTED Final   Streptococcus species NOT DETECTED NOT DETECTED Final   Streptococcus agalactiae NOT DETECTED NOT DETECTED Final   Streptococcus pneumoniae NOT DETECTED NOT DETECTED Final   Streptococcus pyogenes NOT DETECTED NOT DETECTED Final   A.calcoaceticus-baumannii NOT DETECTED NOT DETECTED Final   Bacteroides fragilis NOT DETECTED NOT DETECTED Final   Enterobacterales NOT DETECTED NOT DETECTED Final   Enterobacter cloacae complex NOT DETECTED NOT DETECTED Final   Escherichia coli NOT DETECTED NOT DETECTED Final   Klebsiella aerogenes NOT DETECTED NOT DETECTED Final   Klebsiella oxytoca NOT DETECTED NOT DETECTED Final   Klebsiella pneumoniae NOT DETECTED NOT DETECTED Final   Proteus species NOT DETECTED NOT DETECTED Final   Salmonella species NOT DETECTED NOT DETECTED Final   Serratia marcescens NOT DETECTED NOT DETECTED Final   Haemophilus influenzae NOT DETECTED NOT DETECTED Final   Neisseria meningitidis NOT DETECTED NOT DETECTED Final   Pseudomonas aeruginosa NOT DETECTED NOT DETECTED Final   Stenotrophomonas maltophilia NOT DETECTED NOT DETECTED Final   Candida albicans NOT DETECTED NOT DETECTED Final   Candida auris NOT DETECTED NOT DETECTED Final   Candida glabrata NOT DETECTED NOT DETECTED Final   Candida krusei NOT DETECTED NOT DETECTED Final   Candida parapsilosis NOT DETECTED NOT DETECTED Final   Candida tropicalis NOT DETECTED NOT DETECTED Final   Cryptococcus neoformans/gattii NOT DETECTED NOT DETECTED Final   Methicillin resistance mecA/C  DETECTED (A) NOT DETECTED Final    Comment: CRITICAL RESULT CALLED TO, READ BACK BY AND VERIFIED WITH: MAYA FELICIANO ORN [phone] FCP Performed at [facility], [address], [place], [place] [address]   Respiratory (~20 pathogens) panel by PCR     Status: None   Collection Time: 07/16/24  2:25 PM   Specimen: Nasopharyngeal Swab; Respiratory  Result Value Ref Range Status   Adenovirus NOT DETECTED NOT DETECTED Final   Coronavirus 229E NOT DETECTED NOT DETECTED Final    Comment: (NOTE) The Coronavirus on the Respiratory Panel, DOES NOT test for the novel  Coronavirus (2019 nCoV)    Coronavirus HKU1 NOT DETECTED NOT DETECTED Final   Coronavirus NL63 NOT DETECTED NOT DETECTED Final   Coronavirus OC43 NOT DETECTED NOT DETECTED Final   Metapneumovirus NOT DETECTED NOT DETECTED Final   Rhinovirus / Enterovirus NOT DETECTED NOT DETECTED Final   Influenza A NOT DETECTED NOT DETECTED Final   Influenza B NOT DETECTED NOT DETECTED Final   Parainfluenza Virus 1 NOT DETECTED NOT DETECTED Final   Parainfluenza Virus 2 NOT DETECTED NOT DETECTED Final   Parainfluenza Virus 3 NOT DETECTED NOT DETECTED Final   Parainfluenza Virus 4 NOT DETECTED NOT DETECTED Final   Respiratory Syncytial Virus NOT DETECTED NOT DETECTED Final   Bordetella pertussis NOT DETECTED NOT DETECTED Final   Bordetella Parapertussis NOT DETECTED NOT DETECTED Final   Chlamydophila pneumoniae NOT DETECTED NOT DETECTED Final   Mycoplasma pneumoniae NOT DETECTED NOT DETECTED Final    Comment: Performed at [facility], [address] [place], [place] [address]     Corean Fireman, MSN, NP-C [facility]  [email or web address] Pager: [phone] Office: [phone] [facility] Main Line: [phone] *Secure Chat Communication Welcome  Total Encounter Time: 74m

## 2024-07-21 NOTE — Telephone Encounter (Signed)
 Patient Product/process Development Scientist completed.    The patient is insured through [facility] MEDICAID.     Ran test claim for linezolid 600 and the current 7 day co-pay is $4.00.   This test claim was processed through [facility]- copay amounts may vary at other pharmacies due to pharmacy/plan contracts, or as the patient moves through the different stages of their insurance plan.     Reyes Sharps, CPHT Pharmacy Technician Patient Advocate Specialist Lead [facility] Patient Advocate Team Direct Number: [phone]  Fax: [phone]

## 2024-07-21 NOTE — Plan of Care (Signed)
  Problem: Education: Goal: Ability to describe self-care measures that may prevent or decrease complications (Diabetes Survival Skills Education) will improve Outcome: Progressing Goal: Individualized Educational Video(s) Outcome: Progressing   Problem: Education: Goal: Individualized Educational Video(s) Outcome: Progressing   Problem: Coping: Goal: Ability to adjust to condition or change in health will improve Outcome: Progressing   

## 2024-07-22 DIAGNOSIS — E66813 Obesity, class 3: Secondary | ICD-10-CM

## 2024-07-22 DIAGNOSIS — L0390 Cellulitis, unspecified: Secondary | ICD-10-CM | POA: Diagnosis not present

## 2024-07-22 DIAGNOSIS — I63232 Cerebral infarction due to unspecified occlusion or stenosis of left carotid arteries: Secondary | ICD-10-CM

## 2024-07-22 DIAGNOSIS — E1165 Type 2 diabetes mellitus with hyperglycemia: Secondary | ICD-10-CM | POA: Diagnosis not present

## 2024-07-22 DIAGNOSIS — Z794 Long term (current) use of insulin: Secondary | ICD-10-CM

## 2024-07-22 DIAGNOSIS — I5033 Acute on chronic diastolic (congestive) heart failure: Secondary | ICD-10-CM | POA: Diagnosis not present

## 2024-07-22 LAB — MAGNESIUM: Magnesium: 1.8 mg/dL (ref 1.7–2.4)

## 2024-07-22 LAB — RENAL FUNCTION PANEL
Albumin: 2 g/dL — ABNORMAL LOW (ref 3.5–5.0)
Anion gap: 9 (ref 5–15)
BUN: 13 mg/dL (ref 6–20)
CO2: 25 mmol/L (ref 22–32)
Calcium: 8.4 mg/dL — ABNORMAL LOW (ref 8.9–10.3)
Chloride: 103 mmol/L (ref 98–111)
Creatinine, Ser: 1.04 mg/dL — ABNORMAL HIGH (ref 0.44–1.00)
GFR, Estimated: >60 mL/min (ref >60)
Glucose, Bld: 245 mg/dL — ABNORMAL HIGH (ref 70–99)
Phosphorus: 3.7 mg/dL (ref 2.5–4.6)
Potassium: 3.8 mmol/L (ref 3.5–5.1)
Sodium: 137 mmol/L (ref 135–145)

## 2024-07-22 LAB — CBC
HCT: 34 % — ABNORMAL LOW (ref 36.0–46.0)
Hemoglobin: 10.9 g/dL — ABNORMAL LOW (ref 12.0–15.0)
MCH: 26.1 pg (ref 26.0–34.0)
MCHC: 32.1 g/dL (ref 30.0–36.0)
MCV: 81.3 fL (ref 80.0–100.0)
Platelets: 402 K/uL — ABNORMAL HIGH (ref 150–400)
RBC: 4.18 MIL/uL (ref 3.87–5.11)
RDW: 13.6 % (ref 11.5–15.5)
WBC: 13 K/uL — ABNORMAL HIGH (ref 4.0–10.5)
nRBC: 0.2 % (ref 0.0–0.2)

## 2024-07-22 LAB — GLUCOSE, CAPILLARY
Glucose-Capillary: 267 mg/dL — ABNORMAL HIGH (ref 70–99)
Glucose-Capillary: 262 mg/dL — ABNORMAL HIGH (ref 70–99)

## 2024-07-22 MED ORDER — GABAPENTIN 300 MG PO CAPS: 300.0000 mg | ORAL_CAPSULE | Freq: Two times a day (BID) | ORAL | 0 refills | Status: DC

## 2024-07-22 MED ORDER — INSULIN GLARGINE SOLOSTAR 100 UNIT/ML ~~LOC~~ SOPN: 45.0000 [IU] | PEN_INJECTOR | Freq: Two times a day (BID) | SUBCUTANEOUS | 0 refills | Status: DC

## 2024-07-22 MED ORDER — INSULIN GLARGINE-YFGN 100 UNIT/ML ~~LOC~~ SOLN
45.0000 [IU] | Freq: Two times a day (BID) | SUBCUTANEOUS | Status: DC
  Administered 2024-07-22: 45 [IU] via SUBCUTANEOUS
  Filled 2024-07-22 (×2): qty 0.45

## 2024-07-22 MED ORDER — LINEZOLID 600 MG PO TABS: 600.0000 mg | ORAL_TABLET | Freq: Two times a day (BID) | ORAL | 0 refills | Status: DC

## 2024-07-22 MED ORDER — SENNOSIDES-DOCUSATE SODIUM 8.6-50 MG PO TABS: 1.0000 | ORAL_TABLET | Freq: Two times a day (BID) | ORAL | Status: AC | PRN

## 2024-07-22 MED ORDER — INSULIN LISPRO (1 UNIT DIAL) 100 UNIT/ML (KWIKPEN): 16.0000 [IU] | PEN_INJECTOR | Freq: Three times a day (TID) | SUBCUTANEOUS | 0 refills | Status: DC

## 2024-07-22 MED ORDER — INSULIN ASPART 100 UNIT/ML IJ SOLN: 12.0000 [IU] | Freq: Three times a day (TID) | INTRAMUSCULAR | Status: DC

## 2024-07-22 NOTE — Plan of Care (Signed)
 Problem: Education: Goal: Ability to describe self-care measures that may prevent or decrease complications (Diabetes Survival Skills Education) will improve Outcome: Progressing Goal: Individualized Educational Video(s) Outcome: Progressing   Problem: Coping: Goal: Ability to adjust to condition or change in health will improve Outcome: Progressing   Problem: Fluid Volume: Goal: Ability to maintain a balanced intake and output will improve Outcome: Progressing   Problem: Health Behavior/Discharge Planning: Goal: Ability to identify and utilize available resources and services will improve Outcome: Progressing Goal: Ability to manage health-related needs will improve Outcome: Progressing   Problem: Metabolic: Goal: Ability to maintain appropriate glucose levels will improve Outcome: Progressing   Problem: Nutritional: Goal: Maintenance of adequate nutrition will improve Outcome: Progressing Goal: Progress toward achieving an optimal weight will improve Outcome: Progressing   Problem: Skin Integrity: Goal: Risk for impaired skin integrity will decrease Outcome: Progressing   Problem: Tissue Perfusion: Goal: Adequacy of tissue perfusion will improve Outcome: Progressing   Problem: Education: Goal: Knowledge of General Education information will improve Description: Including pain rating scale, medication(s)/side effects and non-pharmacologic comfort measures Outcome: Progressing   Problem: Health Behavior/Discharge Planning: Goal: Ability to manage health-related needs will improve Outcome: Progressing   Problem: Clinical Measurements: Goal: Ability to maintain clinical measurements within normal limits will improve Outcome: Progressing Goal: Will remain free from infection Outcome: Progressing Goal: Diagnostic test results will improve Outcome: Progressing Goal: Respiratory complications will improve Outcome: Progressing Goal: Cardiovascular complication will  be avoided Outcome: Progressing   Problem: Activity: Goal: Risk for activity intolerance will decrease Outcome: Progressing   Problem: Nutrition: Goal: Adequate nutrition will be maintained Outcome: Progressing   Problem: Coping: Goal: Level of anxiety will decrease Outcome: Progressing   Problem: Elimination: Goal: Will not experience complications related to bowel motility Outcome: Progressing Goal: Will not experience complications related to urinary retention Outcome: Progressing   Problem: Pain Managment: Goal: General experience of comfort will improve and/or be controlled Outcome: Progressing   Problem: Safety: Goal: Ability to remain free from injury will improve Outcome: Progressing   Problem: Skin Integrity: Goal: Risk for impaired skin integrity will decrease Outcome: Progressing   Problem: Education: Goal: Ability to describe self-care measures that may prevent or decrease complications (Diabetes Survival Skills Education) will improve Outcome: Progressing Goal: Individualized Educational Video(s) Outcome: Progressing   Problem: Coping: Goal: Ability to adjust to condition or change in health will improve Outcome: Progressing   Problem: Fluid Volume: Goal: Ability to maintain a balanced intake and output will improve Outcome: Progressing   Problem: Health Behavior/Discharge Planning: Goal: Ability to identify and utilize available resources and services will improve Outcome: Progressing Goal: Ability to manage health-related needs will improve Outcome: Progressing   Problem: Metabolic: Goal: Ability to maintain appropriate glucose levels will improve Outcome: Progressing   Problem: Nutritional: Goal: Maintenance of adequate nutrition will improve Outcome: Progressing Goal: Progress toward achieving an optimal weight will improve Outcome: Progressing   Problem: Skin Integrity: Goal: Risk for impaired skin integrity will decrease Outcome:  Progressing   Problem: Tissue Perfusion: Goal: Adequacy of tissue perfusion will improve Outcome: Progressing   Problem: Education: Goal: Knowledge of General Education information will improve Description: Including pain rating scale, medication(s)/side effects and non-pharmacologic comfort measures Outcome: Progressing   Problem: Health Behavior/Discharge Planning: Goal: Ability to manage health-related needs will improve Outcome: Progressing   Problem: Clinical Measurements: Goal: Ability to maintain clinical measurements within normal limits will improve Outcome: Progressing Goal: Will remain free from infection Outcome: Progressing Goal: Diagnostic test results will  improve Outcome: Progressing Goal: Respiratory complications will improve Outcome: Progressing Goal: Cardiovascular complication will be avoided Outcome: Progressing   Problem: Activity: Goal: Risk for activity intolerance will decrease Outcome: Progressing   Problem: Nutrition: Goal: Adequate nutrition will be maintained Outcome: Progressing   Problem: Coping: Goal: Level of anxiety will decrease Outcome: Progressing   Problem: Elimination: Goal: Will not experience complications related to bowel motility Outcome: Progressing Goal: Will not experience complications related to urinary retention Outcome: Progressing   Problem: Pain Managment: Goal: General experience of comfort will improve and/or be controlled Outcome: Progressing   Problem: Safety: Goal: Ability to remain free from injury will improve Outcome: Progressing   Problem: Skin Integrity: Goal: Risk for impaired skin integrity will decrease Outcome: Progressing   Problem: Education: Goal: Ability to describe self-care measures that may prevent or decrease complications (Diabetes Survival Skills Education) will improve Outcome: Progressing Goal: Individualized Educational Video(s) Outcome: Progressing   Problem: Coping: Goal:  Ability to adjust to condition or change in health will improve Outcome: Progressing   Problem: Fluid Volume: Goal: Ability to maintain a balanced intake and output will improve Outcome: Progressing   Problem: Health Behavior/Discharge Planning: Goal: Ability to identify and utilize available resources and services will improve Outcome: Progressing Goal: Ability to manage health-related needs will improve Outcome: Progressing   Problem: Metabolic: Goal: Ability to maintain appropriate glucose levels will improve Outcome: Progressing   Problem: Nutritional: Goal: Maintenance of adequate nutrition will improve Outcome: Progressing Goal: Progress toward achieving an optimal weight will improve Outcome: Progressing   Problem: Skin Integrity: Goal: Risk for impaired skin integrity will decrease Outcome: Progressing   Problem: Tissue Perfusion: Goal: Adequacy of tissue perfusion will improve Outcome: Progressing   Problem: Education: Goal: Knowledge of General Education information will improve Description: Including pain rating scale, medication(s)/side effects and non-pharmacologic comfort measures Outcome: Progressing   Problem: Health Behavior/Discharge Planning: Goal: Ability to manage health-related needs will improve Outcome: Progressing   Problem: Clinical Measurements: Goal: Ability to maintain clinical measurements within normal limits will improve Outcome: Progressing Goal: Will remain free from infection Outcome: Progressing Goal: Diagnostic test results will improve Outcome: Progressing Goal: Respiratory complications will improve Outcome: Progressing Goal: Cardiovascular complication will be avoided Outcome: Progressing   Problem: Activity: Goal: Risk for activity intolerance will decrease Outcome: Progressing   Problem: Nutrition: Goal: Adequate nutrition will be maintained Outcome: Progressing   Problem: Coping: Goal: Level of anxiety will  decrease Outcome: Progressing   Problem: Elimination: Goal: Will not experience complications related to bowel motility Outcome: Progressing Goal: Will not experience complications related to urinary retention Outcome: Progressing   Problem: Pain Managment: Goal: General experience of comfort will improve and/or be controlled Outcome: Progressing   Problem: Safety: Goal: Ability to remain free from injury will improve Outcome: Progressing   Problem: Skin Integrity: Goal: Risk for impaired skin integrity will decrease Outcome: Progressing   Problem: Education: Goal: Ability to describe self-care measures that may prevent or decrease complications (Diabetes Survival Skills Education) will improve Outcome: Progressing Goal: Individualized Educational Video(s) Outcome: Progressing   Problem: Coping: Goal: Ability to adjust to condition or change in health will improve Outcome: Progressing   Problem: Fluid Volume: Goal: Ability to maintain a balanced intake and output will improve Outcome: Progressing   Problem: Health Behavior/Discharge Planning: Goal: Ability to identify and utilize available resources and services will improve Outcome: Progressing Goal: Ability to manage health-related needs will improve Outcome: Progressing   Problem: Metabolic: Goal: Ability to maintain appropriate glucose  levels will improve Outcome: Progressing   Problem: Nutritional: Goal: Maintenance of adequate nutrition will improve Outcome: Progressing Goal: Progress toward achieving an optimal weight will improve Outcome: Progressing   Problem: Skin Integrity: Goal: Risk for impaired skin integrity will decrease Outcome: Progressing   Problem: Tissue Perfusion: Goal: Adequacy of tissue perfusion will improve Outcome: Progressing   Problem: Education: Goal: Knowledge of General Education information will improve Description: Including pain rating scale, medication(s)/side effects  and non-pharmacologic comfort measures Outcome: Progressing   Problem: Health Behavior/Discharge Planning: Goal: Ability to manage health-related needs will improve Outcome: Progressing   Problem: Clinical Measurements: Goal: Ability to maintain clinical measurements within normal limits will improve Outcome: Progressing Goal: Will remain free from infection Outcome: Progressing Goal: Diagnostic test results will improve Outcome: Progressing Goal: Respiratory complications will improve Outcome: Progressing Goal: Cardiovascular complication will be avoided Outcome: Progressing   Problem: Activity: Goal: Risk for activity intolerance will decrease Outcome: Progressing   Problem: Nutrition: Goal: Adequate nutrition will be maintained Outcome: Progressing   Problem: Coping: Goal: Level of anxiety will decrease Outcome: Progressing   Problem: Elimination: Goal: Will not experience complications related to bowel motility Outcome: Progressing Goal: Will not experience complications related to urinary retention Outcome: Progressing   Problem: Pain Managment: Goal: General experience of comfort will improve and/or be controlled Outcome: Progressing   Problem: Safety: Goal: Ability to remain free from injury will improve Outcome: Progressing   Problem: Skin Integrity: Goal: Risk for impaired skin integrity will decrease Outcome: Progressing

## 2024-07-22 NOTE — Plan of Care (Signed)
   Problem: Clinical Measurements: Goal: Will remain free from infection Outcome: Progressing   Problem: Education: Goal: Knowledge of General Education information will improve Description: Including pain rating scale, medication(s)/side effects and non-pharmacologic comfort measures Outcome: Progressing

## 2024-07-22 NOTE — Progress Notes (Signed)
 PT Cancellation Note  Patient Details Name: Carolyn Zimmerman MRN: [id number] DOB: 1986-02-17   Cancelled Treatment:    Reason Eval/Treat Not Completed: (P) Patient declined, no reason specified (Pt politely defers to attempt stair training or gait trial with PTA. PTA reviewed stair sequencing method for reduced LE pain via visual/verbal demo, pt receptive.) Pt defers need for HEP for home strengthening or DME needs, she reports her mom has a cane she can borrow if she needs one PRN for pain mgmt.  Arrival time: 10:08 Departure time: 10:12am.  No charge for session Connell CHRISTELLA Blue 07/22/2024, 10:15 AM

## 2024-07-22 NOTE — Progress Notes (Signed)
 Reviewed AVS, patient expressed understanding of medications, MD follow up reviewed.   Patient states all belongings brought to the hospital at time of admission are accounted for and packed to take home.   Patient informed and expressed understanding where to pick up discharge medications.   [facility] contacted to transport patient to entrance A,  where family member was waiting in vehicle to transport home.

## 2024-07-22 NOTE — Discharge Summary (Signed)
 Physician Discharge Summary  Carolyn Zimmerman FMW:[id number] DOB: 12/18/1985 DOA: 07/15/2024  PCP: Leonarda Roxan BROCKS, NP  Admit date: 07/15/2024 Discharge date: 07/22/24  Admitted From: Home Disposition: Home Recommendations for Outpatient Follow-up:  Outpatient follow-up with PCP and primary endocrinologist in 1 to 2 weeks Reassess right leg, blood pressure, glycemic control, CMP and CBC at follow-up Repeat thyroid panel in 4 to 6 weeks outpatient. Nonemergent thyroid ultrasound for enlarged thyroid outpatient. Encourage lifestyle change to lose weight. Please follow up on the following pending results: None  Home Health: No need identified. Equipment/Devices: No need identified.  Discharge Condition: Stable CODE STATUS: Full code Diet Orders (From admission, onward)     Start     Ordered   07/19/24 0959  Diet heart healthy/carb modified Room service appropriate? Yes; Fluid consistency: Thin; Fluid restriction: 1500 mL Fluid  Diet effective now       Question Answer Comment  Diet-HS Snack? Nothing   Room service appropriate? Yes   Fluid consistency: Thin   Fluid restriction: 1500 mL Fluid      07/19/24 0958             Follow-up Information     Ngetich, [name], NP. Go on 07/25/2024.   Specialty: Family Medicine Why: @10 :40AM Contact information: [address] [place] [address] [phone]                 Hospital course 38 year old F with PMH of HFmrEF, CVA, IDDM-2, HTN, prior right leg cellulitis and morbid obesity presented to [facility] with generalized body pain including legs, back and stomach as well as some chills and chest congestion.  Initially, admission requested for possible acute CHF.  She was given Lasix , ibuprofen  and ceftriaxone  in ED.    On arrival to [facility], patient with significant RLE cellulitis.  Continued on ceftriaxone .    Blood culture with MRSE in 1 out of 4 bottles felt to be contaminant.  TTE without significant  finding.  LE venous Doppler negative.  Right lower extremity CT suggested cellulitis without abscess or osseous abnormalities.  Vancomycin  added.  Cellulitis improved.  Antibiotic de-escalated to p.o. Zyvox and Augmentin  on 10/31.  ID consulted and recommended completing 10 days of Zyvox and leg elevation.  Patient is discharged on p.o. Zyvox for 4 more days to complete total of 10 days course.  Titrated insulin  for better glycemic control.  She has been extensively counseled and encouraged to have her diabetes under good control.   Encouraged to follow-up with PCP and endocrinologist as soon as possible..   See individual problem list below for more.   Problems addressed during this hospitalization Sepsis due to RLE cellulitis: Prior history of RLE cellulitis earlier this year. A1c 13.5%.  CK normal. CRP 2.1.  ESR 102. Blood culture with MRSE in 1 out of 4 bottles likely contaminant.  Contrast CT suggested cellulitis with no fluid collection or osseous abnormalities.  Cellulitis improved. -Vancomycin  10/30>> Zyvox 10/31-11/8 per recommendation by ID -Ceftriaxone  10/28>> Augmentin  10/31>> ceftriaxone  11/3.  -Encouraged to elevate her legs as much as possible -Continue diuretics.  Discontinued amlodipine . -Glycemic control as below.   Bilateral lower extremity edema/pain: Has BLE edema.  Unclear how much of this is acute.  She is on amlodipine  10 mg daily which could contribute.  Seems like she also has neuropathic pain from poorly controlled diabetes.  Lower extremity venous Doppler negative for DVT.  - Discontinued amlodipine  -Manage cellulitis as above. -Continue diuretics  Chronic HFimpEF: TTE normal (LVEF of 40 to 45% GH G1DD in 10/2023).  She has BLE edema.  proBNP slightly elevated.  Inconsistently uses torsemide  at home.  Net -8 L. -Continue home torsemide , Aldactone  and Coreg . - Continue home Imdur, Coreg  and valsartan . -Outpatient follow-up with cardiology as previously planned    Poorly controlled IDDM-2 with hyperglycemia: A1c 13.5%.  Reports using Lantus  38 units at bedtime and Humalog  16 units 3 times daily before meals.  Hyperglycemia likely due to dietary indiscretion.  Reports metformin  intolerance.  Her endocrinologist did want her on GLP-1 agonist due to history of pancreatitis. -Increased home Lantus  from 38 units at bedtime to 45 units twice daily -Continue NovoLog  16 units 3 times daily with meals -Extensively counseled on the importance of good glycemic control -Encouraged to follow-up with PCP and primary endocrinologist as soon as possible   Chest congestion: No history of asthma or COPD.  She has no URI symptoms.  proBNP slightly elevated.  Received IV Lasix  in ED. TTE reassuring.  RVP negative.  Seems to have resolved. - Continue diuretics as above.   Elevated troponin: Mild without significant delta.  Patient has no chest pain.  Likely demand ischemia.  Recent coronary CTA nondiagnostic.  TTE reassuring. -Continue home valsartan , Coreg , Imdur, Crestor , Vascepa and aspirin    History of CVA: No focal neurodeficit. - Continue started on aspirin .   Essential hypertension: BP was elevated but improved.  On amlodipine , Coreg , Aldactone , torsemide  -Continue home meds -Discontinue amlodipine  due to lower extremity edema -Encourage compliance.   Hepatomegaly: Noted on CT.  Fatty liver? -Outpatient follow-up with GI   Bilateral inguinal lymphadenopathy: Likely reactive from cellulitis. - Consider repeat CT once cellulitis fully treated   Enlarged thyroid gland: Low TSH but free T4 suggesting euthyroid sick syndrome. -Recheck thyroid panel in 4 to 6 weeks. -Nonemergent thyroid ultrasound outpatient.   Hypokalemia: Resolved.   Morbid obesity Body mass index is 45.96 kg/m.  -Encourage lifestyle change to lose weight         Consultations: Infectious disease  Time spent 36  minutes  Vital signs Vitals:   07/21/24 2001 07/22/24 0010  07/22/24 0433 07/22/24 0718  BP: (!) 153/77 (!) 155/77 (!) 153/73 (!) 153/83  Pulse: 79 79 77 78  Temp: 98.7 F (37.1 C) 98.7 F (37.1 C) 98 F (36.7 C) 98.2 F (36.8 C)  Resp: 20 16 18 17   Height:      Weight:      SpO2: 98% 95% 100% 100%  TempSrc: Oral Oral Oral Oral  BMI (Calculated):         Discharge exam  GENERAL: No acute distress.  Appears well.  HEENT: MMM.  Vision and hearing grossly intact.  NECK: Supple.  No apparent JVD.  RESP:  No IWOB. Good air movement bilaterally. CVS:  RRR. Heart sounds normal.  ABD/GI/GU: Bowel sounds present. Soft. Non tender.  No CVA tenderness. MSK/EXT:  Moves extremities.  RLE  swelling and tenderness (improved).  No erythema.SABRA SKIN: As above. NEURO: Awake, alert and oriented appropriately.  No gross deficit.  PSYCH: Calm. Normal affect.  Discharge Instructions Discharge Instructions     Discharge instructions   Complete by: As directed    It has been a pleasure taking care of you!  You were hospitalized due to right leg infection for which you have been treated with antibiotics.  We are discharging you more antibiotics to complete treatment course.  Continue elevating your legs and using your diuretics.  Is  also very important that you have your diabetes under good control.  We have made adjustment to your insulin  during this hospitalization.  Please review your new medication list and the directions on your medications before you take them.  See a separate instruction for more on diabetes management.  Follow-up with your primary care doctor and endocrinologist as soon as possible.   Take care,   Increase activity slowly   Complete by: As directed       Allergies as of 07/22/2024       Reactions   Prochlorperazine  Anxiety   Panic attacks   Contrast Media [iodinated Contrast Media] Hives, Swelling        Medication List     STOP taking these medications    Aleve  220 MG tablet Generic drug: naproxen  sodium    amLODipine  10 MG tablet Commonly known as: NORVASC    hydrochlorothiazide  25 MG tablet Commonly known as: HYDRODIURIL    metoprolol  tartrate 100 MG tablet Commonly known as: LOPRESSOR    predniSONE  50 MG tablet Commonly known as: DELTASONE        TAKE these medications    acetaminophen  500 MG tablet Commonly known as: TYLENOL  Take 1,000 mg by mouth every 6 (six) hours as needed for mild pain (pain score 1-3) or moderate pain (pain score 4-6).   Aspirin  Low Dose 81 MG chewable tablet Generic drug: aspirin  Chew 1 tablet (81 mg total) by mouth daily.   bismuth subsalicylate 262 MG/15ML suspension Commonly known as: PEPTO BISMOL Take 30 mLs by mouth every 6 (six) hours as needed for indigestion or diarrhea or loose stools.   carvedilol  25 MG tablet Commonly known as: COREG  Take 1 tablet (25 mg total) by mouth 2 (two) times daily.   FreeStyle Libre 3 Plus Sensor Misc Replace every 15 days   FreeStyle Marathon 3 Reader [place] Use as directed for continuous glucose monitoring.   gabapentin  300 MG capsule Commonly known as: NEURONTIN  Take 1 capsule (300 mg total) by mouth 2 (two) times daily.   hydrALAZINE  25 MG tablet Commonly known as: APRESOLINE  Take 1 tablet (25 mg total) by mouth 2 (two) times daily.   hydrOXYzine  10 MG tablet Commonly known as: ATARAX  Take 1 tablet (10 mg total) by mouth as needed for anxiety.   Insulin  Glargine Solostar 100 UNIT/ML Solostar Pen Commonly known as: LANTUS  Inject 45 Units into the skin in the morning and at bedtime. What changed:  how much to take when to take this   insulin  lispro 100 UNIT/ML KwikPen Commonly known as: HUMALOG  Inject 16 Units into the skin 3 (three) times daily.   linezolid 600 MG tablet Commonly known as: ZYVOX Take 1 tablet (600 mg total) by mouth 2 (two) times daily with a meal for 4 days.   loperamide  2 MG capsule Commonly known as: IMODIUM  Take 1 capsule (2 mg total) by mouth 4 (four) times daily as  needed for diarrhea or loose stools.   nystatin  cream Commonly known as: MYCOSTATIN  Apply to affected area 2 times daily What changed:  how much to take how to take this when to take this reasons to take this additional instructions   rosuvastatin  20 MG tablet Commonly known as: CRESTOR  Take 1 tablet (20 mg total) by mouth daily.   senna-docusate 8.6-50 MG tablet Commonly known as: Senokot-S Take 1-2 tablets by mouth 2 (two) times daily between meals as needed for mild constipation or moderate constipation.   spironolactone  50 MG tablet Commonly known as: ALDACTONE  Take 1  tablet (50 mg total) by mouth daily.   torsemide  20 MG tablet Commonly known as: DEMADEX  Take 2 tablets (40 mg total) by mouth daily. What changed: when to take this   valsartan  320 MG tablet Commonly known as: DIOVAN  Take 1 tablet (320 mg total) by mouth daily.   Vascepa 1 g capsule Generic drug: icosapent Ethyl Take 2 g by mouth 2 (two) times daily.   Ventolin  HFA 108 (90 Base) MCG/ACT inhaler Generic drug: albuterol  Inhale 2 puffs into the lungs every 4 (four) hours as needed for wheezing or shortness of breath.         Procedures/Studies:   VAS US  LOWER EXTREMITY VENOUS (DVT) Result Date: 07/17/2024  Lower Venous DVT Study Patient Name:  KARY SUGRUE  Date of Exam:   07/17/2024 Medical Rec #: [id number]     Accession #:    [id number] Date of Birth: 10-06-1985     Patient Gender: F Patient Age:   60 years Exam Location:  [facility] Procedure:      VAS US  LOWER EXTREMITY VENOUS (DVT) Referring Phys: MIGNON Abdulhamid Olgin --------------------------------------------------------------------------------  Indications: Asymmetric leg swelling. Sepsis secondary to right lower extremity cellulitis.  Risk Factors: Right venous reflux disease diagnosed 02/21/24. Limitations: Body habitus. Comparison Study: Prior negative right LEV done 10/31/23 and 10/13/23 Performing Technologist: Edilia Elden Appl   Examination Guidelines: A complete evaluation includes B-mode imaging, spectral Doppler, color Doppler, and power Doppler as needed of all accessible portions of each vessel. Bilateral testing is considered an integral part of a complete examination. Limited examinations for reoccurring indications may be performed as noted. The reflux portion of the exam is performed with the patient in reverse Trendelenburg.  +---------+---------------+---------+-----------+----------+--------------+ RIGHT    CompressibilityPhasicitySpontaneityPropertiesThrombus Aging +---------+---------------+---------+-----------+----------+--------------+ CFV      Full           Yes      Yes                                 +---------+---------------+---------+-----------+----------+--------------+ SFJ      Full           Yes      Yes                                 +---------+---------------+---------+-----------+----------+--------------+ FV Prox  Full                                                        +---------+---------------+---------+-----------+----------+--------------+ FV Mid   Full                                                        +---------+---------------+---------+-----------+----------+--------------+ FV DistalFull                                                        +---------+---------------+---------+-----------+----------+--------------+ PFV      Full                                                        +---------+---------------+---------+-----------+----------+--------------+  POP      Full           Yes      Yes                                 +---------+---------------+---------+-----------+----------+--------------+ PTV      Full                                                        +---------+---------------+---------+-----------+----------+--------------+ PERO     Full                                                         +---------+---------------+---------+-----------+----------+--------------+   +---------+---------------+---------+-----------+----------+--------------+ LEFT     CompressibilityPhasicitySpontaneityPropertiesThrombus Aging +---------+---------------+---------+-----------+----------+--------------+ CFV      Full           Yes      Yes                                 +---------+---------------+---------+-----------+----------+--------------+ SFJ      Full           Yes      Yes                                 +---------+---------------+---------+-----------+----------+--------------+ FV Prox  Full                                                        +---------+---------------+---------+-----------+----------+--------------+ FV Mid   Full                                                        +---------+---------------+---------+-----------+----------+--------------+ FV DistalFull                                                        +---------+---------------+---------+-----------+----------+--------------+ PFV      Full           Yes      Yes                                 +---------+---------------+---------+-----------+----------+--------------+ POP      Full                                                        +---------+---------------+---------+-----------+----------+--------------+  PTV      Full                                                        +---------+---------------+---------+-----------+----------+--------------+ PERO     Full                                                        +---------+---------------+---------+-----------+----------+--------------+     Summary: BILATERAL: - No evidence of deep vein thrombosis seen in the lower extremities, bilaterally. -No evidence of popliteal cyst, bilaterally.   *See table(s) above for measurements and observations. Electronically signed by Debby Robertson on 07/17/2024 at 8:15:19 PM.     Final    CT TIBIA FIBULA RIGHT WO CONTRAST Result Date: 07/17/2024 EXAM: CT RIGHT LOWER EXTREMITY, WITHOUT IV CONTRAST 07/17/2024 12:01:47 PM TECHNIQUE: Axial images were acquired through the right lower extremity without IV contrast. Reformatted images were reviewed. Automated exposure control, iterative reconstruction, and/or weight based adjustment of the mA/kV was utilized to reduce the radiation dose to as low as reasonably achievable. COMPARISON: 10/11/2023 CLINICAL HISTORY: Soft tissue infection suspected, lower leg. FINDINGS: BONES AND JOINTS: No acute fracture or focal osseous lesion. No bony destructive findings to suggest osteomyelitis. No dislocation. The joint spaces are normal. Trace knee joint effusion. SOFT TISSUES: Subcutaneous edema observed anteriorly in the distal thigh, anterior to the patellar tendon, and tracking along the intermedial and anterior tibial margin and a pattern roughly similar to the 10/11/2023 exam. This likewise tracks along the superficial fascial margin of the tibialis anterior muscle and extends down into the dorsum of the ankle. Low grade posterior subcutaneous edema in the calf is nonspecific. No abnormal gas tracking in the soft tissues. No drainable abscess observed. No obvious intramuscular process or effacement of fat planes along the compartments of the calf. Upper popliteal lymph node 0.9 cm in short axis on image 37 series 3, possibly reactive, previously the same size. IMPRESSION: 1. No acute osseous abnormality. 2. Subcutaneous edema in the distal thigh and along the anterior tibial margin, similar to prior, without abscess or soft-tissue gas. 3. No intramuscular process. 4. Trace knee joint effusion. 5. Upper popliteal lymph node likely reactive, unchanged. Electronically signed by: Ryan Salvage MD 07/17/2024 12:24 PM EDT RP Workstation: [id number]   ECHOCARDIOGRAM COMPLETE Result Date: 07/17/2024    ECHOCARDIOGRAM REPORT   Patient Name:   FAYE STROHMAN Date of Exam: 07/17/2024 Medical Rec #:  [id number]    Height:       67.5 in Accession #:    [id number]   Weight:       296.7 lb Date of Birth:  May 02, 1986    BSA:          2.405 m Patient Age:    38 years     BP:           159/95 mmHg Patient Gender: F            HR:           85 bpm. Exam Location:  Inpatient Procedure: 2D Echo, Cardiac Doppler and Color Doppler (Both Spectral and Color  Flow Doppler were utilized during procedure). Indications:    Other abnormalities of the heart  History:        Patient has prior history of Echocardiogram examinations, most                 recent 11/01/2023. CHF; Risk Factors:Diabetes, Dyslipidemia,                 Hypertension and Former Smoker.  Sonographer:    Juliene Rucks Referring Phys: [id number] MIGNON DASEN Dheeraj Hail  Sonographer Comments: Patient is obese. Image acquisition challenging due to patient body habitus. IMPRESSIONS  1. Left ventricular ejection fraction, by estimation, is 60 to 65%. The left ventricle has normal function. The left ventricle has no regional wall motion abnormalities. The left ventricular internal cavity size was severely dilated. There is mild concentric left ventricular hypertrophy. Left ventricular diastolic parameters were normal.  2. Right ventricular systolic function is normal. The right ventricular size is normal. Tricuspid regurgitation signal is inadequate for assessing PA pressure.  3. Left atrial size was mildly dilated.  4. The mitral valve is normal in structure. Trivial mitral valve regurgitation. No evidence of mitral stenosis.  5. The aortic valve is normal in structure. Aortic valve regurgitation is not visualized. No aortic stenosis is present.  6. The inferior vena cava is normal in size with greater than 50% respiratory variability, suggesting right atrial pressure of 3 mmHg.  7. A small pericardial effusion is present. The pericardial effusion is circumferential. There is no evidence of cardiac tamponade. FINDINGS  Left  Ventricle: Left ventricular ejection fraction, by estimation, is 60 to 65%. The left ventricle has normal function. The left ventricle has no regional wall motion abnormalities. The left ventricular internal cavity size was severely dilated. There is mild concentric left ventricular hypertrophy. Left ventricular diastolic parameters were normal. Normal left ventricular filling pressure. Right Ventricle: The right ventricular size is normal. No increase in right ventricular wall thickness. Right ventricular systolic function is normal. Tricuspid regurgitation signal is inadequate for assessing PA pressure. Left Atrium: Left atrial size was mildly dilated. Right Atrium: Right atrial size was normal in size. Pericardium: A small pericardial effusion is present. The pericardial effusion is circumferential. There is no evidence of cardiac tamponade. Mitral Valve: The mitral valve is normal in structure. Trivial mitral valve regurgitation. No evidence of mitral valve stenosis. Tricuspid Valve: The tricuspid valve is normal in structure. Tricuspid valve regurgitation is not demonstrated. No evidence of tricuspid stenosis. Aortic Valve: The aortic valve is normal in structure. Aortic valve regurgitation is not visualized. No aortic stenosis is present. Aortic valve mean gradient measures 7.0 mmHg. Aortic valve peak gradient measures 13.5 mmHg. Aortic valve area, by VTI measures 2.78 cm. Pulmonic Valve: The pulmonic valve was normal in structure. Pulmonic valve regurgitation is not visualized. No evidence of pulmonic stenosis. Aorta: The aortic root is normal in size and structure. Venous: The inferior vena cava is normal in size with greater than 50% respiratory variability, suggesting right atrial pressure of 3 mmHg. IAS/Shunts: No atrial level shunt detected by color flow Doppler.  LEFT VENTRICLE PLAX 2D LVIDd:         6.20 cm   Diastology LVIDs:         4.10 cm   LV e' medial:    9.03 cm/s LV PW:         1.50 cm   LV  E/e' medial:  10.5 LV IVS:        1.20 cm  LV e' lateral:   6.85 cm/s LVOT diam:     2.10 cm   LV E/e' lateral: 13.8 LV SV:         91 LV SV Index:   38 LVOT Area:     3.46 cm LV IVRT:       106 msec  RIGHT VENTRICLE RV Basal diam:  3.90 cm RV Mid diam:    3.60 cm RV S prime:     19.40 cm/s TAPSE (M-mode): 3.2 cm LEFT ATRIUM             Index        RIGHT ATRIUM           Index LA Vol (A2C):   96.8 ml 40.26 ml/m  RA Area:     15.20 cm LA Vol (A4C):   72.8 ml 30.27 ml/m  RA Volume:   35.90 ml  14.93 ml/m LA Biplane Vol: 86.4 ml 35.93 ml/m  AORTIC VALVE AV Area (Vmax):    2.60 cm AV Area (Vmean):   2.54 cm AV Area (VTI):     2.78 cm AV Vmax:           184.00 cm/s AV Vmean:          127.000 cm/s AV VTI:            0.328 m AV Peak Grad:      13.5 mmHg AV Mean Grad:      7.0 mmHg LVOT Vmax:         138.00 cm/s LVOT Vmean:        93.200 cm/s LVOT VTI:          0.263 m LVOT/AV VTI ratio: 0.80  AORTA Ao Root diam: 3.00 cm Ao Asc diam:  3.00 cm MITRAL VALVE MV Area (PHT): 3.83 cm    SHUNTS MV Decel Time: 198 msec    Systemic VTI:  0.26 m MR Peak grad: 33.9 mmHg    Systemic Diam: 2.10 cm MR Vmax:      291.00 cm/s MV E velocity: 94.70 cm/s MV A velocity: 99.40 cm/s MV E/A ratio:  0.95 Wilbert Bihari MD Electronically signed by Wilbert Bihari MD Signature Date/Time: 07/17/2024/9:58:47 AM    Final    CT Chest Wo Contrast Result Date: 07/15/2024 EXAM: CT CHEST WITHOUT CONTRAST 07/15/2024 10:46:16 PM TECHNIQUE: CT of the chest was performed without the administration of intravenous contrast. Multiplanar reformatted images are provided for review. Automated exposure control, iterative reconstruction, and/or weight based adjustment of the mA/kV was utilized to reduce the radiation dose to as low as reasonably achievable. COMPARISON: Chest x-ray dated 07/15/2024 and Cardiac CT dated 06/09/2024. CLINICAL HISTORY: Pneumonia, complication suspected, xray done. Pneumonia; Triage note:; C/o cough, congestion, body aches  starting this morning. FINDINGS: MEDIASTINUM: The heart is mildly enlarged. There is a small pericardial effusion similar to prior. The central airways are clear. Thyroid gland is prominent in size. LYMPH NODES: There are enlarged prevascular lymph nodes measuring up to 14 mm. Bilateral axillary lymph nodes are prominent but nonenlarged. LUNGS AND PLEURA: No focal consolidation or pulmonary edema. No pleural effusion or pneumothorax. SOFT TISSUES/BONES: No acute abnormality of the bones or soft tissues. UPPER ABDOMEN: Limited images of the upper abdomen demonstrates no acute abnormality. IMPRESSION: 1. No acute findings. 2. Mild cardiomegaly with small pericardial effusion, similar to prior. 3. Prominent thyroid gland. Consider non-emergent thyroid ultrasound given thyroid enlargement. 4. Enlarged prevascular lymph nodes measuring up to 14 mm. Correlate clinically and  consider follow-up as indicated. Electronically signed by: Greig Pique MD 07/15/2024 10:49 PM EDT RP Workstation: [id number]   CT ABDOMEN PELVIS WO CONTRAST Result Date: 07/15/2024 EXAM: CT ABDOMEN AND PELVIS WITHOUT CONTRAST 07/15/2024 08:17:19 PM TECHNIQUE: CT of the abdomen and pelvis was performed without the administration of intravenous contrast. Multiplanar reformatted images are provided for review. Automated exposure control, iterative reconstruction, and/or weight-based adjustment of the mA/kV was utilized to reduce the radiation dose to as low as reasonably achievable. COMPARISON: CT abdomen and pelvis. CLINICAL HISTORY: Abdominal pain, acute, nonlocalized. Triage note: C/o cough, congestion, body aches starting this morning. FINDINGS: LOWER CHEST: There is a trace pericardial effusion. LIVER: The liver is enlarged. GALLBLADDER AND BILE DUCTS: Gallbladder is surgically absent. No biliary ductal dilatation. SPLEEN: No acute abnormality. PANCREAS: No acute abnormality. ADRENAL GLANDS: No acute abnormality. KIDNEYS, URETERS AND BLADDER: No  stones in the kidneys or ureters. No hydronephrosis. There is minimal bilateral perinephric fat stranding which is nonspecific and unchanged. Urinary bladder is unremarkable. GI AND BOWEL: Stomach demonstrates no acute abnormality. The appendix appears. There is no bowel obstruction. PERITONEUM AND RETROPERITONEUM: There is trace free fluid in the pelvis. No free air. VASCULATURE: Aorta is normal in caliber. LYMPH NODES: Enlarged bilateral inguinal lymph nodes measuring up to 18 mm short axis. REPRODUCTIVE ORGANS: No acute abnormality. BONES AND SOFT TISSUES: No acute osseous abnormality. There is mild subcutaneous stranding in the lower anterior abdominal wall. IMPRESSION: 1. Enlarged bilateral inguinal lymph nodes measuring up to 18 mm short axis. 2. Trace pericardial effusion. 3. Hepatomegaly. 4. Minimal bilateral perinephric fat stranding, nonspecific and unchanged. Correlate clinically for infection. Electronically signed by: Greig Pique MD 07/15/2024 08:23 PM EDT RP Workstation: [id number]   DG Chest 2 View Result Date: 07/15/2024 EXAM: 2 VIEW(S) XRAY OF THE CHEST 07/15/2024 06:09:00 PM COMPARISON: Chest x-ray 02/01/2024. CLINICAL HISTORY: shortness of breath, bilateral leg swelling, chest pain. C/o cough, congestion, body aches starting this morning. FINDINGS: LUNGS AND PLEURA: No focal pulmonary opacity. No pulmonary edema. No pleural effusion. No pneumothorax. HEART AND MEDIASTINUM: Heart is borderline enlarged, unchanged. BONES AND SOFT TISSUES: No acute osseous abnormality. IMPRESSION: 1. No acute process. 2. Borderline enlarged heart, unchanged. Electronically signed by: Greig Pique MD 07/15/2024 06:22 PM EDT RP Workstation: [id number]       The results of significant diagnostics from this hospitalization (including imaging, microbiology, ancillary and laboratory) are listed below for reference.     Microbiology: Recent Results (from the past 240 hours)  Blood culture (routine x 2)      Status: None   Collection Time: 07/15/24  1:24 AM   Specimen: BLOOD LEFT HAND  Result Value Ref Range Status   Specimen Description   Final    BLOOD LEFT HAND Performed at [facility], [address], [place], [place] [address]    Special Requests   Final    BOTTLES DRAWN AEROBIC AND ANAEROBIC Blood Culture adequate volume Performed at [facility], [address] [place], [place] [address]    Culture   Final    NO GROWTH 5 DAYS Performed at [facility], [address] [place], [place] [address]    Report Status 07/21/2024 FINAL  Final  Resp panel by RT-PCR (RSV, Flu A&B, Covid) Anterior Nasal Swab     Status: None   Collection Time: 07/15/24  2:59 PM   Specimen: Anterior Nasal Swab  Result Value Ref Range Status   SARS Coronavirus 2 by RT PCR NEGATIVE NEGATIVE  Final    Comment: (NOTE) SARS-CoV-2 target nucleic acids are NOT DETECTED.  The SARS-CoV-2 RNA is generally detectable in upper respiratory specimens during the acute phase of infection. The lowest concentration of SARS-CoV-2 viral copies this assay can detect is 138 copies/mL. A negative result does not preclude SARS-Cov-2 infection and should not be used as the sole basis for treatment or other patient management decisions. A negative result may occur with  improper specimen collection/handling, submission of specimen other than nasopharyngeal swab, presence of viral mutation(s) within the areas targeted by this assay, and inadequate number of viral copies(<138 copies/mL). A negative result must be combined with clinical observations, patient history, and epidemiological information. The expected result is Negative.  Fact Sheet for Patients:  bloggercourse.com  Fact Sheet for Healthcare Providers:  seriousbroker.it  This test is no t yet approved or cleared by the [place]  FDA and  has been authorized for  detection and/or diagnosis of SARS-CoV-2 by FDA under an Emergency Use Authorization (EUA). This EUA will remain  in effect (meaning this test can be used) for the duration of the COVID-19 declaration under Section 564(b)(1) of the Act, 21 U.S.C.section 360bbb-3(b)(1), unless the authorization is terminated  or revoked sooner.       Influenza A by PCR NEGATIVE NEGATIVE Final   Influenza B by PCR NEGATIVE NEGATIVE Final    Comment: (NOTE) The Xpert Xpress SARS-CoV-2/FLU/RSV plus assay is intended as an aid in the diagnosis of influenza from Nasopharyngeal swab specimens and should not be used as a sole basis for treatment. Nasal washings and aspirates are unacceptable for Xpert Xpress SARS-CoV-2/FLU/RSV testing.  Fact Sheet for Patients: bloggercourse.com  Fact Sheet for Healthcare Providers: seriousbroker.it  This test is not yet approved or cleared by the [place]  FDA and has been authorized for detection and/or diagnosis of SARS-CoV-2 by FDA under an Emergency Use Authorization (EUA). This EUA will remain in effect (meaning this test can be used) for the duration of the COVID-19 declaration under Section 564(b)(1) of the Act, 21 U.S.C. section 360bbb-3(b)(1), unless the authorization is terminated or revoked.     Resp Syncytial Virus by PCR NEGATIVE NEGATIVE Final    Comment: (NOTE) Fact Sheet for Patients: bloggercourse.com  Fact Sheet for Healthcare Providers: seriousbroker.it  This test is not yet approved or cleared by the [place]  FDA and has been authorized for detection and/or diagnosis of SARS-CoV-2 by FDA under an Emergency Use Authorization (EUA). This EUA will remain in effect (meaning this test can be used) for the duration of the COVID-19 declaration under Section 564(b)(1) of the Act, 21 U.S.C. section 360bbb-3(b)(1), unless the authorization is  terminated or revoked.  Performed at [facility], [address] [place], [place] [address]   Urine Culture     Status: Abnormal   Collection Time: 07/15/24  9:30 PM   Specimen: Urine, Clean Catch  Result Value Ref Range Status   Specimen Description   Final    URINE, CLEAN CATCH Performed at [facility], [address], [place], [place] [address]    Special Requests   Final    NONE Performed at [facility], [address] [place], [place] [address]    Culture (A)  Final    10,000 COLONIES/mL GROUP B STREP(S.AGALACTIAE)ISOLATED TESTING AGAINST S. AGALACTIAE NOT ROUTINELY PERFORMED DUE TO PREDICTABILITY OF AMP/PEN/VAN SUSCEPTIBILITY. Performed at [facility], [address] [place], [place] [address]    Report Status 07/17/2024 FINAL  Final  Blood culture (routine x 2)     Status: Abnormal   Collection Time: 07/15/24  9:58 PM   Specimen: BLOOD  Result Value Ref Range Status   Specimen Description   Final    BLOOD RIGHT ANTECUBITAL Performed at [facility], [address], [place], [place] [address]    Special Requests   Final    BOTTLES DRAWN AEROBIC AND ANAEROBIC Blood Culture adequate volume Performed at [facility], [address] [place], [place] [address]    Culture  Setup Time   Final    GRAM POSITIVE COCCI IN CLUSTERS AEROBIC BOTTLE ONLY CRITICAL RESULT CALLED TO, READ BACK BY AND VERIFIED WITH: PHARMD BLAKE W [id number] FCP    Culture (A)  Final    STAPHYLOCOCCUS EPIDERMIDIS THE SIGNIFICANCE OF ISOLATING THIS ORGANISM FROM A SINGLE SET OF BLOOD CULTURES WHEN MULTIPLE SETS ARE DRAWN IS UNCERTAIN. PLEASE NOTIFY THE MICROBIOLOGY DEPARTMENT WITHIN ONE WEEK IF SPECIATION AND SENSITIVITIES ARE REQUIRED. Performed at [facility], [address] [place], [place] [address]    Report Status 07/18/2024 FINAL  Final  Blood Culture ID Panel (Reflexed)      Status: Abnormal   Collection Time: 07/15/24  9:58 PM  Result Value Ref Range Status   Enterococcus faecalis NOT DETECTED NOT DETECTED Final   Enterococcus Faecium NOT DETECTED NOT DETECTED Final   Listeria monocytogenes NOT DETECTED NOT DETECTED Final   Staphylococcus species DETECTED (A) NOT DETECTED Final    Comment: CRITICAL RESULT CALLED TO, READ BACK BY AND VERIFIED WITH: MAYA FELICIANO ORN [id number] FCP    Staphylococcus aureus (BCID) NOT DETECTED NOT DETECTED Final   Staphylococcus epidermidis DETECTED (A) NOT DETECTED Final    Comment: Methicillin (oxacillin) resistant coagulase negative staphylococcus. Possible blood culture contaminant (unless isolated from more than one blood culture draw or clinical case suggests pathogenicity). No antibiotic treatment is indicated for blood  culture contaminants. CRITICAL RESULT CALLED TO, READ BACK BY AND VERIFIED WITH: PHARMD BLAKE W [id number] FCP    Staphylococcus lugdunensis NOT DETECTED NOT DETECTED Final   Streptococcus species NOT DETECTED NOT DETECTED Final   Streptococcus agalactiae NOT DETECTED NOT DETECTED Final   Streptococcus pneumoniae NOT DETECTED NOT DETECTED Final   Streptococcus pyogenes NOT DETECTED NOT DETECTED Final   A.calcoaceticus-baumannii NOT DETECTED NOT DETECTED Final   Bacteroides fragilis NOT DETECTED NOT DETECTED Final   Enterobacterales NOT DETECTED NOT DETECTED Final   Enterobacter cloacae complex NOT DETECTED NOT DETECTED Final   Escherichia coli NOT DETECTED NOT DETECTED Final   Klebsiella aerogenes NOT DETECTED NOT DETECTED Final   Klebsiella oxytoca NOT DETECTED NOT DETECTED Final   Klebsiella pneumoniae NOT DETECTED NOT DETECTED Final   Proteus species NOT DETECTED NOT DETECTED Final   Salmonella species NOT DETECTED NOT DETECTED Final   Serratia marcescens NOT DETECTED NOT DETECTED Final   Haemophilus influenzae NOT DETECTED NOT DETECTED Final   Neisseria meningitidis NOT DETECTED NOT DETECTED  Final   Pseudomonas aeruginosa NOT DETECTED NOT DETECTED Final   Stenotrophomonas maltophilia NOT DETECTED NOT DETECTED Final   Candida albicans NOT DETECTED NOT DETECTED Final   Candida auris NOT DETECTED NOT DETECTED Final   Candida glabrata NOT DETECTED NOT DETECTED Final   Candida krusei NOT DETECTED NOT DETECTED Final   Candida parapsilosis NOT DETECTED NOT DETECTED Final   Candida tropicalis NOT DETECTED NOT DETECTED Final   Cryptococcus neoformans/gattii NOT DETECTED NOT DETECTED Final   Methicillin resistance mecA/C DETECTED (A) NOT  DETECTED Final    Comment: CRITICAL RESULT CALLED TO, READ BACK BY AND VERIFIED WITH: MAYA FELICIANO ORN [phone] FCP Performed at [facility], [address] [place], [place] [address]   Respiratory (~20 pathogens) panel by PCR     Status: None   Collection Time: 07/16/24  2:25 PM   Specimen: Nasopharyngeal Swab; Respiratory  Result Value Ref Range Status   Adenovirus NOT DETECTED NOT DETECTED Final   Coronavirus 229E NOT DETECTED NOT DETECTED Final    Comment: (NOTE) The Coronavirus on the Respiratory Panel, DOES NOT test for the novel  Coronavirus (2019 nCoV)    Coronavirus HKU1 NOT DETECTED NOT DETECTED Final   Coronavirus NL63 NOT DETECTED NOT DETECTED Final   Coronavirus OC43 NOT DETECTED NOT DETECTED Final   Metapneumovirus NOT DETECTED NOT DETECTED Final   Rhinovirus / Enterovirus NOT DETECTED NOT DETECTED Final   Influenza A NOT DETECTED NOT DETECTED Final   Influenza B NOT DETECTED NOT DETECTED Final   Parainfluenza Virus 1 NOT DETECTED NOT DETECTED Final   Parainfluenza Virus 2 NOT DETECTED NOT DETECTED Final   Parainfluenza Virus 3 NOT DETECTED NOT DETECTED Final   Parainfluenza Virus 4 NOT DETECTED NOT DETECTED Final   Respiratory Syncytial Virus NOT DETECTED NOT DETECTED Final   Bordetella pertussis NOT DETECTED NOT DETECTED Final   Bordetella Parapertussis NOT DETECTED NOT DETECTED Final   Chlamydophila pneumoniae NOT  DETECTED NOT DETECTED Final   Mycoplasma pneumoniae NOT DETECTED NOT DETECTED Final    Comment: Performed at [facility], [address] [place], [place] [address]     Labs:  CBC: Recent Labs  Lab 07/15/24 1803 07/16/24 1508 07/18/24 0239 07/19/24 0655 07/20/24 1101 07/21/24 0556 07/22/24 0245  WBC 22.9*   < > 8.8 8.6 10.6* 13.6* 13.0*  NEUTROABS 20.3*  --   --   --   --   --   --   HGB 11.0*   < > 9.6* 9.6* 9.5* 10.0* 10.9*  HCT 32.7*   < > 30.7* 30.6* 30.4* 31.6* 34.0*  MCV 79.4*   < > 83.4 83.4 81.9 82.1 81.3  PLT 349   < > 305 352 377 378 402*   < > = values in this interval not displayed.   BMP &GFR Recent Labs  Lab 07/18/24 0239 07/19/24 0655 07/20/24 1101 07/21/24 0556 07/22/24 0245  NA 137 135 137 135 137  K 3.9 3.6 3.4* 3.9 3.8  CL 101 102 101 102 103  CO2 24 22 26 22 25   GLUCOSE 313* 295* 253* 239* 245*  BUN 12 11 11 13 13   CREATININE 1.01* 1.01* 0.89 0.88 1.04*  CALCIUM  8.1* 8.2* 8.4* 8.3* 8.4*  MG 2.4 1.8 1.6* 2.2 1.8  PHOS 3.5 3.5 4.3 3.6 3.7   Estimated Creatinine Clearance: 108.3 mL/min (A) (by C-G formula based on SCr of 1.04 mg/dL (H)). Liver & Pancreas: Recent Labs  Lab 07/15/24 1803 07/16/24 1508 07/17/24 0301 07/18/24 0239 07/19/24 0655 07/20/24 1101 07/21/24 0556 07/22/24 0245  AST 19 19 16   --   --   --   --   --   ALT 13 15 16   --   --   --   --   --   ALKPHOS 94 64 66  --   --   --   --   --   BILITOT 0.5 0.4 0.2  --   --   --   --   --  PROT 6.7 5.9* 5.6*  --   --   --   --   --   ALBUMIN 3.2* 1.9* 1.8* 1.8* 1.8* 1.9* 1.9* 2.0*   No results for input(s): LIPASE, AMYLASE in the last 168 hours. No results for input(s): AMMONIA in the last 168 hours. Diabetic: No results for input(s): HGBA1C in the last 72 hours. Recent Labs  Lab 07/21/24 1100 07/21/24 1551 07/21/24 2058 07/22/24 0653 07/22/24 0737  GLUCAP 196* 238* 220* 267* 262*   Cardiac Enzymes: Recent Labs  Lab 07/16/24 1508 07/20/24 1101   CKTOTAL 215 105   Recent Labs    02/01/24 2319 07/15/24 1803  PROBNP 353.0* 1,386.0*   Coagulation Profile: No results for input(s): INR, PROTIME in the last 168 hours. Thyroid Function Tests: No results for input(s): TSH, T4TOTAL, FREET4, T3FREE, THYROIDAB in the last 72 hours. Lipid Profile: No results for input(s): CHOL, HDL, LDLCALC, TRIG, CHOLHDL, LDLDIRECT in the last 72 hours. Anemia Panel: No results for input(s): VITAMINB12, FOLATE, FERRITIN, TIBC, IRON, RETICCTPCT in the last 72 hours. Urine analysis:    Component Value Date/Time   COLORURINE YELLOW 07/15/2024 1857   APPEARANCEUR CLEAR 07/15/2024 1857   LABSPEC 1.007 07/15/2024 1857   PHURINE 6.5 07/15/2024 1857   GLUCOSEU 500 (A) 07/15/2024 1857   HGBUR SMALL (A) 07/15/2024 1857   BILIRUBINUR NEGATIVE 07/15/2024 1857   BILIRUBINUR Negative 05/14/2024 1205   KETONESUR NEGATIVE 07/15/2024 1857   PROTEINUR 100 (A) 07/15/2024 1857   UROBILINOGEN negative (A) 05/14/2024 1205   UROBILINOGEN 1.0 01/19/2012 0015   NITRITE NEGATIVE 07/15/2024 1857   LEUKOCYTESUR SMALL (A) 07/15/2024 1857   Sepsis Labs: Invalid input(s): PROCALCITONIN, LACTICIDVEN   SIGNED:  Mignon ONEIDA Bump, MD  [facility] 07/22/2024, 2:05 PM

## 2024-07-22 NOTE — TOC Transition Note (Signed)
 Transition of Care [facility]) - Discharge Note   Patient Details  Name: Carolyn Zimmerman MRN: [id number] Date of Birth: [birth year]  [facility] CM/SW Contact:  Waddell Barnie Rama, RN Phone Number: 07/22/2024, 9:51 AM   Clinical Narrative:    For dc today, has no needs.          Patient Goals and CMS Choice            Discharge Placement                       Discharge Plan and Services Additional resources added to the After Visit Summary for                                       Social Drivers of Health (SDOH) Interventions SDOH Screenings   Food Insecurity: Patient Declined (05/11/2024)  Housing: High Risk (05/11/2024)  Transportation Needs: No Transportation Needs (05/11/2024)  Utilities: Medium Risk (01/08/2024)   Received from [facility]  Alcohol Screen: Low Risk  (05/11/2024)  Depression (PHQ2-9): Low Risk  (05/14/2024)  Financial Resource Strain: Medium Risk (05/11/2024)  Physical Activity: Insufficiently Active (05/11/2024)  Social Connections: Socially Isolated (05/11/2024)  Stress: Stress Concern Present (05/11/2024)  Tobacco Use: Medium Risk (07/16/2024)     Readmission Risk Interventions     No data to display

## 2024-07-23 ENCOUNTER — Telehealth: Payer: Self-pay | Admitting: Pharmacist

## 2024-07-23 ENCOUNTER — Telehealth: Payer: Self-pay

## 2024-07-23 DIAGNOSIS — E1165 Type 2 diabetes mellitus with hyperglycemia: Secondary | ICD-10-CM

## 2024-07-23 DIAGNOSIS — Z794 Long term (current) use of insulin: Secondary | ICD-10-CM

## 2024-07-23 MED ORDER — SULFAMETHOXAZOLE-TRIMETHOPRIM 800-160 MG PO TABS: 1.0000 | ORAL_TABLET | Freq: Two times a day (BID) | ORAL | 0 refills | Status: DC

## 2024-07-23 NOTE — Transitions of Care (Post Inpatient/ED Visit) (Signed)
 07/23/2024  Name: Carolyn Zimmerman MRN: [id number] DOB: 1985/12/01  Today's TOC FU Call Status: Today's TOC FU Call Status:: Successful TOC FU Call Completed TOC FU Call Complete Date: 07/23/24 Patient's Name and Date of Birth confirmed.  Transition Care Management Follow-up Telephone Call Date of Discharge: 07/22/24 Discharge Facility: [facility]) Type of Discharge: Inpatient Admission Primary Inpatient Discharge Diagnosis:: Sepsis due to cellulitis How have you been since you were released from the hospital?: Better Any questions or concerns?: No  Items Reviewed: Did you receive and understand the discharge instructions provided?: Yes Medications obtained,verified, and reconciled?: No Medications Not Reviewed Reasons:: Other: (has not picked up antibiotics due to cost.) Any new allergies since your discharge?: No Dietary orders reviewed?: Yes Type of Diet Ordered:: low salt hearthealthy diet Do you have support at home?: Yes People in Home [RPT]: parent(s), sibling(s) Name of Support/Comfort Primary Source: MOm- Carolyn Zimmerman  Medications Reviewed Today: Medications Reviewed Today     Reviewed by Carolyn Alan PENNER, RN (Registered Nurse) on 07/23/24 at 1347  Med List Status: <None>   Medication Order Taking? Sig Documenting Provider Last Dose Status Informant  acetaminophen  (TYLENOL ) 500 MG tablet [id number] Yes Take 1,000 mg by mouth every 6 (six) hours as needed for mild pain (pain score 1-3) or moderate pain (pain score 4-6). [provider]  Active Self, Pharmacy Records  albuterol  (VENTOLIN  HFA) 108 (90 Base) MCG/ACT inhaler [id number] Yes Inhale 2 puffs into the lungs every 4 (four) hours as needed for wheezing or shortness of breath. Trixie Nilda HERO, MD  Active Self, Pharmacy Records  aspirin  81 MG chewable tablet [id number] Yes Chew 1 tablet (81 mg total) by mouth daily. Ngetich, [name], NP  Active Self, Pharmacy Records           Med Note MACK CASSIUS JINNY Pablo Nov 05, 2023  5:52 PM)    bismuth subsalicylate (PEPTO BISMOL) 262 MG/15ML suspension [id number] Yes Take 30 mLs by mouth every 6 (six) hours as needed for indigestion or diarrhea or loose stools. [provider]  Active Self, Pharmacy Records  carvedilol  (COREG ) 25 MG tablet [id number] Yes Take 1 tablet (25 mg total) by mouth 2 (two) times daily. Trixie Nilda HERO, MD  Active Self, Pharmacy Records  Continuous Glucose Receiver (FREESTYLE LIBRE 3 READER) [place] [id number]  Use as directed for continuous glucose monitoring.  Patient not taking: Reported on 07/23/2024   [provider]  Active Self, Pharmacy Records  Continuous Glucose Sensor (FREESTYLE LIBRE 3 PLUS SENSOR) [place] [id number]  Replace every 15 days  Patient not taking: Reported on 07/23/2024   [provider]  Active Self, Pharmacy Records  gabapentin  (NEURONTIN ) 300 MG capsule [id number]  Take 1 capsule (300 mg total) by mouth 2 (two) times daily.  Patient not taking: Reported on 07/23/2024   Gonfa, Taye T, MD  Active   hydrALAZINE  (APRESOLINE ) 25 MG tablet [id number] Yes Take 1 tablet (25 mg total) by mouth 2 (two) times daily. Tobb, Kardie, DO  Active Self, Pharmacy Records  hydrOXYzine  (ATARAX ) 10 MG tablet [id number] Yes Take 1 tablet (10 mg total) by mouth as needed for anxiety. Ngetich, [name], NP  Active Self, Pharmacy Records           Med Note EFRAIM, ALFREIDA CROME   Wed Jul 16, 2024  4:19 PM) Recently the patient has been anxious more often so she has been taking it just about every day.  icosapent Ethyl (VASCEPA) 1 g capsule  [id number] Yes Take 2 g by mouth 2 (two) times daily. [provider]  Active Self, Pharmacy Records  Insulin  Glargine Solostar (LANTUS ) 100 UNIT/ML Solostar Pen [id number] Yes Inject 45 Units into the skin in the morning and at bedtime. Gonfa, Taye T, MD  Active   insulin  lispro (HUMALOG ) 100 UNIT/ML KwikPen [id number] Yes Inject 16 Units into the skin 3 (three) times daily. Gonfa, Taye T,  MD  Active   linezolid (ZYVOX) 600 MG tablet [id number]  Take 1 tablet (600 mg total) by mouth 2 (two) times daily with a meal for 4 days.  Patient not taking: Reported on 07/23/2024   Gonfa, Taye T, MD  Active   loperamide  (IMODIUM ) 2 MG capsule [id number]  Take 1 capsule (2 mg total) by mouth 4 (four) times daily as needed for diarrhea or loose stools.  Patient not taking: Reported on 07/23/2024   Mannie Fairy DASEN, DO  Active Self, Pharmacy Records  nystatin  cream (MYCOSTATIN ) [id number] Yes Apply to affected area 2 times daily Mannie Fairy DASEN, DO  Active Self, Pharmacy Records  rosuvastatin  (CRESTOR ) 20 MG tablet [id number] Yes Take 1 tablet (20 mg total) by mouth daily. Trixie Nilda HERO, MD  Active Self, Pharmacy Records  senna-docusate [facility]) 8.6-50 MG tablet [id number]  Take 1-2 tablets by mouth 2 (two) times daily between meals as needed for mild constipation or moderate constipation.  Patient not taking: Reported on 07/23/2024   Gonfa, Taye T, MD  Active   spironolactone  (ALDACTONE ) 50 MG tablet [id number] Yes Take 1 tablet (50 mg total) by mouth daily. Ngetich, [name], NP  Active Self, Pharmacy Records  torsemide  (DEMADEX ) 20 MG tablet [id number] Yes Take 2 tablets (40 mg total) by mouth daily. Ngetich, [name], NP  Active Self, Pharmacy Records           Med Note EFRAIM ALFREIDA LITTIE Stevan Jul 16, 2024  4:26 PM) Patient confirmed she did not stop taking this medication all together but she is not taking it every day but every other day.  valsartan  (DIOVAN ) 320 MG tablet [id number] Yes Take 1 tablet (320 mg total) by mouth daily. Ngetich, Roxan BROCKS, NP  Active Self, Pharmacy Records            Home Care and Equipment/Supplies: Were Home Health Services Ordered?: No Any new equipment or medical supplies ordered?: No  Functional Questionnaire: Do you need assistance with bathing/showering or dressing?: No Do you need assistance with meal preparation?: No Do you need assistance  with eating?: No Do you have difficulty maintaining continence: No Do you need assistance with getting out of bed/getting out of a chair/moving?: No Do you have difficulty managing or taking your medications?: No  Follow up appointments reviewed: PCP Follow-up appointment confirmed?: Yes Date of PCP follow-up appointment?: 07/25/24 Follow-up Provider: PCP Specialist Hospital Follow-up appointment confirmed?: Yes Date of Specialist follow-up appointment?: 09/05/24 Follow-Up Specialty Provider:: Cardiology Do you need transportation to your follow-up appointment?: No Do you understand care options if your condition(s) worsen?: Yes-patient verbalized understanding  SDOH Interventions Today    Flowsheet Row Most Recent Value  SDOH Interventions   Food Insecurity Interventions Intervention Not Indicated  Housing Interventions Intervention Not Indicated  Transportation Interventions Intervention Not Indicated  Utilities Interventions Intervention Not Indicated   Reviewed and offered 30 day TOC program and patient has consented.  This note sent to PCP and pharmacist.    Goals Addressed  This Visit's Progress    [facility] Transitions of Care (TOC) Care Plan       Problems:  Recent Hospitalization for treatment of cellulitis and sepsis Medication management barrier has not been able to get medications due to cost ( linezolid (ZYVOX) 600 MG tablet ) reports 1100.00 Elevated CBG  Goal:  Over the next 30 days, the patient will not experience hospital readmission  Interventions:  Transitions of Care: Doctor Visits  - discussed the importance of doctor visits Communication with pharmacist Cassius Brought and PCP re: elevated CBG and not having antibiotics.  Contacted provider for patient needs see above Post discharge activity limitations prescribed by provider reviewed Post-op wound/incision care reviewed with patient/caregiver Reviewed Signs and symptoms of infection Diabetes  Interventions: Assessed patient's understanding of A1c goal: <6.5% Provided education to patient about basic DM disease process Reviewed medications with patient and discussed importance of medication adherence Counseled on importance of regular laboratory monitoring as prescribed Discussed plans with patient for ongoing care management follow up and provided patient with direct contact information for care management team Provided patient with written educational materials related to hypo and hyperglycemia and importance of correct treatment Reviewed scheduled/upcoming provider appointments including: PCP Referral made to pharmacy team for assistance with medications Assessed social determinant of health barriers Reviewed increased water intake helps with lowering CBG Lab Results  Component Value Date   HGBA1C 13.5 (H) 07/16/2024   Evaluation of current treatment plan related to Sepsis and cellulitis,  self-management and patient's adherence to plan as established by provider. Discussed plans with patient for ongoing care management follow up and provided patient with direct contact information for care management team Advised patient to take all antibiotics as prescribed. Collaborated with pharmacy regarding missing meds Pharmacy referral for missing meds Discussed plans with patient for ongoing care management follow up and provided patient with direct contact information for care management team Screening for signs and symptoms of depression related to chronic disease state  Assessed social determinant of health barriers  Patient Self Care Activities:  Attend all scheduled provider appointments Call pharmacy for medication refills 3-7 days in advance of running out of medications Call provider office for new concerns or questions  Notify RN Care Manager of TOC call rescheduling needs Participate in Transition of Care Program/Attend TOC scheduled calls Perform all self care  activities independently  Take medications as prescribed   Work with the pharmacist to address medication management needs and will continue to work with the clinical team to address health care and disease management related needs check blood sugar at prescribed times: as suggested by MD.  check feet daily for cuts, sores or redness take the blood sugar log to all doctor visits trim toenails straight across keep feet up while sitting wash and dry feet carefully every day wear comfortable, cotton socks wear comfortable, well-fitting shoes Wear compression hose as directed  Plan:  Telephone follow up appointment with care management team member scheduled for:  07/30/2024  The patient has been provided with contact information for the care management team and has been advised to call with any health related questions or concerns.        Alan Ee, RN, BSN, CEN [employer]- Transition of Care Team.  [facility] [phone]

## 2024-07-23 NOTE — Telephone Encounter (Signed)
 Called [facility] to inform them of the patient's mother's questions and that she was requesting a call back today

## 2024-07-23 NOTE — Telephone Encounter (Signed)
 The medication patient needs is $1,000 for 8 tablets of Zyvox   Patient is with her mother and the mother wants to know if this Bactrim  DS that was sent in if it is as strong as Zyvox  Patient's mother wanted to know if there were any samples of Zyvox  Contact [phone]  Patient's mother wants a call back today prior to picking up the new prescription

## 2024-07-23 NOTE — Telephone Encounter (Signed)
 Pharmacist sent message indicating that Zyvox is covered by patient's insurance. Discontinue Bactrim  DS and continue with Zyvox as prescribed from the hospital.

## 2024-07-23 NOTE — Progress Notes (Unsigned)
   07/23/2024  Patient ID: Carolyn Zimmerman, female   DOB: 07-Aug-1986, 38 y.o.   MRN: [id number]   Received a message from [facility] Nurse Case Manager, Alan Ee contacted me stating the Patient was having difficulty affording Linezolid post discharge.  Called the Patient's Pharmacy--[facility] on [address].  While on hold the formulary of the Patient's insurance was reviewed.  Linezolid is on their formulary.    Pharmacist at [facility] stated the prescription was ready and had copay of $4.  Generic Bactrim  had been called in for the Patient as a substitute but with the Patient's medical history, the original prescription would be a better option.  Contacted Patient's Provider, Patient, and her mother, [name].  Roxan Plough, NP recommended that Patient pick up the Linezolid   Ms. Sprinkle called me from the Pharmacy stating she was able to get the medication.   Cassius DOROTHA Brought, PharmD, BCACP Clinical Pharmacist [phone]

## 2024-07-23 NOTE — Telephone Encounter (Signed)
 Bactrim  DS called into pharmacy  (would not submit electronically). Medication list update to show discontinuation of Zyvox.   I left a detailed voicemail with providers response for patient.

## 2024-07-23 NOTE — Telephone Encounter (Signed)
-   discontinue Zyvox as requested due to cost  - Start on Sulfamethoxazole /Trimethoprim  800/160 mg tablet one by mouth twice daily x 7 days for leg cellulitis with MRSA positive per hospital discharge records. Please follow up as schedule.

## 2024-07-23 NOTE — Telephone Encounter (Signed)
 Copied from CRM [id number]. Topic: Clinical - Medication Question >> Jul 23, 2024 12:09 PM Laurier C wrote: Reason for CRM: Patient was prescribed the following medication: linezolid (ZYVOX) 600 MG tablet and is unable to afford it at the pharmacy. Patient would like to know if there is a generic brand that can be prescribed.

## 2024-07-23 NOTE — Addendum Note (Signed)
 Addended by: SUELLEN DEVIN BROCKS on: 07/23/2024 02:31 PM   Modules accepted: Orders

## 2024-07-25 ENCOUNTER — Ambulatory Visit (INDEPENDENT_AMBULATORY_CARE_PROVIDER_SITE_OTHER): Admitting: Family

## 2024-07-25 ENCOUNTER — Encounter: Payer: Self-pay | Admitting: Family

## 2024-07-25 VITALS — BP 138/88 | HR 84 | Temp 97.7°F | Resp 20 | Ht 67.5 in | Wt 294.8 lb

## 2024-07-25 DIAGNOSIS — R590 Localized enlarged lymph nodes: Secondary | ICD-10-CM

## 2024-07-25 DIAGNOSIS — I10 Essential (primary) hypertension: Secondary | ICD-10-CM

## 2024-07-25 DIAGNOSIS — E782 Mixed hyperlipidemia: Secondary | ICD-10-CM | POA: Diagnosis not present

## 2024-07-25 DIAGNOSIS — Z8673 Personal history of transient ischemic attack (TIA), and cerebral infarction without residual deficits: Secondary | ICD-10-CM

## 2024-07-25 DIAGNOSIS — R160 Hepatomegaly, not elsewhere classified: Secondary | ICD-10-CM

## 2024-07-25 DIAGNOSIS — Z794 Long term (current) use of insulin: Secondary | ICD-10-CM

## 2024-07-25 DIAGNOSIS — Z23 Encounter for immunization: Secondary | ICD-10-CM

## 2024-07-25 DIAGNOSIS — E049 Nontoxic goiter, unspecified: Secondary | ICD-10-CM

## 2024-07-25 DIAGNOSIS — L03115 Cellulitis of right lower limb: Secondary | ICD-10-CM

## 2024-07-25 DIAGNOSIS — E1165 Type 2 diabetes mellitus with hyperglycemia: Secondary | ICD-10-CM

## 2024-07-25 DIAGNOSIS — I5020 Unspecified systolic (congestive) heart failure: Secondary | ICD-10-CM

## 2024-07-25 MED ORDER — DEXCOM G7 SENSOR MISC: 1.0000 | Freq: Every day | 5 refills | Status: DC

## 2024-07-26 LAB — COMPREHENSIVE METABOLIC PANEL WITH GFR
AG Ratio: 0.9 (calc) — ABNORMAL LOW (ref 1.0–2.5)
ALT: 20 U/L (ref 6–29)
AST: 15 U/L (ref 10–30)
Albumin: 3.1 g/dL — ABNORMAL LOW (ref 3.6–5.1)
Alkaline phosphatase (APISO): 96 U/L (ref 31–125)
BUN: 16 mg/dL (ref 7–25)
CO2: 25 mmol/L (ref 20–32)
Calcium: 8.8 mg/dL (ref 8.6–10.2)
Chloride: 98 mmol/L (ref 98–110)
Creat: 0.9 mg/dL (ref 0.50–0.97)
Globulin: 3.6 g/dL (ref 1.9–3.7)
Glucose, Bld: 472 mg/dL — ABNORMAL HIGH (ref 65–99)
Potassium: 4.3 mmol/L (ref 3.5–5.3)
Sodium: 130 mmol/L — ABNORMAL LOW (ref 135–146)
Total Bilirubin: 0.2 mg/dL (ref 0.2–1.2)
Total Protein: 6.7 g/dL (ref 6.1–8.1)
eGFR: 85 mL/min/1.73m2 (ref >=60)

## 2024-07-26 LAB — CBC WITH DIFFERENTIAL/PLATELET
Absolute Lymphocytes: 3178 {cells}/uL (ref 850–3900)
Absolute Monocytes: 684 {cells}/uL (ref 200–950)
Basophils Absolute: 58 {cells}/uL (ref 0–200)
Basophils Relative: 0.5 %
Eosinophils Absolute: 186 {cells}/uL (ref 15–500)
Eosinophils Relative: 1.6 %
HCT: 34.7 % — ABNORMAL LOW (ref 35.0–45.0)
Hemoglobin: 10.8 g/dL — ABNORMAL LOW (ref 11.7–15.5)
MCH: 26.3 pg — ABNORMAL LOW (ref 27.0–33.0)
MCHC: 31.1 g/dL — ABNORMAL LOW (ref 32.0–36.0)
MCV: 84.4 fL (ref 80.0–100.0)
MPV: 10.4 fL (ref 7.5–12.5)
Monocytes Relative: 5.9 %
Neutro Abs: 7494 {cells}/uL (ref 1500–7800)
Neutrophils Relative %: 64.6 %
Platelets: 421 Thousand/uL — ABNORMAL HIGH (ref 140–400)
RBC: 4.11 Million/uL (ref 3.80–5.10)
RDW: 14.4 % (ref 11.0–15.0)
Total Lymphocyte: 27.4 %
WBC: 11.6 Thousand/uL — ABNORMAL HIGH (ref 3.8–10.8)

## 2024-07-28 ENCOUNTER — Telehealth: Payer: Self-pay

## 2024-07-28 NOTE — Telephone Encounter (Signed)
 Incoming fax received from patients pharmacy to initiate a prior authorization for                   .  PA initiated through covermymeds. Key: [id number]  Awaiting reply from the insurance company which will be determined in 48-72 hours.

## 2024-07-30 ENCOUNTER — Ambulatory Visit: Payer: Self-pay | Admitting: Family

## 2024-07-30 ENCOUNTER — Telehealth: Payer: Self-pay

## 2024-07-31 ENCOUNTER — Ambulatory Visit
Admission: RE | Admit: 2024-07-31 | Discharge: 2024-07-31 | Disposition: A | Discharge status: Home / Self Care | Source: Ambulatory Visit | Attending: Family | Admitting: Family

## 2024-07-31 ENCOUNTER — Inpatient Hospital Stay: Admission: RE | Admit: 2024-07-31 | Discharge: 2024-07-31 | Attending: Family | Admitting: Family

## 2024-07-31 DIAGNOSIS — E049 Nontoxic goiter, unspecified: Secondary | ICD-10-CM

## 2024-07-31 DIAGNOSIS — R590 Localized enlarged lymph nodes: Secondary | ICD-10-CM

## 2024-07-31 NOTE — Progress Notes (Signed)
 Provider: Roxan Plough FNP-C   Ronnita Paz, Roxan BROCKS, NP  Patient Care Team: Myrna Vonseggern, Roxan BROCKS, NP as PCP - General (Family Medicine) Tobb, Kardie, DO as PCP - Cardiology (Cardiology) Sheldon Standing, MD as Consulting Physician (General Surgery) Alger Gong, MD as Consulting Physician (Obstetrics and Gynecology) Gladis Sin, RN (Inactive) as [facility] Care Management Rumalda Alan PENNER, RN as [facility] Care Management  Extended Emergency Contact Information Primary Emergency Contact: Sprinkle,Sonya  [place] Mobile Phone: [phone] Relation: Mother Secondary Emergency Contact: rhodes,shalaka Mobile Phone: [phone] Relation: Sister  Code Status: Full code Goals of care: Advanced Directive information    07/25/2024   10:45 AM  Advanced Directives  Does Patient Have a Medical Advance Directive? No  Would patient like information on creating a medical advance directive? No - Patient declined     Chief Complaint  Patient presents with   Hospitalization Follow-up    Follow-up from recent hospital stay       Discussed the use of AI scribe software for clinical note transcription with the patient, who gave verbal consent to proceed.  History of Present Illness   Carolyn Zimmerman is a 38 year old female with chronic heart failure, type 2 diabetes, and hypertension who presents for a hospital follow-up after recent admission for generalized body pain and cellulitis.  She was admitted to the hospital on October 28 and discharged on November 4 due to generalized body pain, including pain in the legs, back, and stomach, as well as chest congestion. During her stay, she was treated for chronic congestive heart failure and cellulitis in the right leg.  She was diagnosed with methicillin-resistant Staphylococcus aureus (MRSA) cellulitis in the right leg, confirmed by blood cultures. She received treatment with IV antibiotics including Rocephin , vancomycin , and Zyvox, and was  discharged with a prescription for Zyvox to complete a total of 8 days of antibiotics. She currently has two pills left. An ultrasound Doppler was negative for blood clots, and a CT scan showed cellulitis without abscess.  Her type 2 diabetes management includes Lantus  38 units at bedtime and Humalog  16 units three times daily before meals. Her blood sugars remain high, ranging from 250 to 320 mg/dL. She has a history of intolerance to metformin  and a history of pancreatitis, which limits her treatment options.  She has chronic heart failure with an ejection fraction of 42-45%. She experiences minimal shortness of breath and is currently on medications including valsartan , carvedilol , Imdur, Crestor , Farxiga, and aspirin . She also has a history of stroke without any residual deficits.  She has hypertension and is on amlodipine , carvedilol , aldactone , and tosemide. She experiences occasional leg swelling.  She was noted to have hepatomegaly and bilateral inguinal lymphadenopathy.  She experiences high levels of anxiety, requiring frequent use of her medication. She describes feeling 'always to the roof' and 'manic'.  She exercises once or twice a week and is aware of the need for lifestyle changes to manage her weight. No bowel movement issues or constipation.         Past Medical History:  Diagnosis Date   Abscess of chest wall 06/15/2017   Cellulitis    Coronavirus infection 05/02/2020   CVA (cerebral vascular accident) (HCC) 07/14/2022   L MCA CVA, watershed infarct   Diabetes mellitus    Heart failure with mildly reduced ejection fraction (HFmrEF) (HCC) 09/29/2021   EF 45%, moderate LVH   Hyperlipidemia associated with type 2 diabetes mellitus (HCC)    Hypertension  Jaundice    Obesity    TOA (tubo-ovarian abscess) 09/2019   Past Surgical History:  Procedure Laterality Date   CHOLECYSTECTOMY     IRRIGATION AND DEBRIDEMENT ABSCESS N/A 05/02/2020   Procedure: IRRIGATION AND  DEBRIDEMENT ABDOMINAL and CHEST WALL NECROTIZING FASCITITS;  Surgeon: Sheldon Standing, MD;  Location: [facility];  Service: General;  Laterality: N/A;   liver stent      Allergies  Allergen Reactions   Prochlorperazine  Anxiety    Panic attacks   Contrast Media [Iodinated Contrast Media] Hives and Swelling    Allergies as of 07/25/2024       Reactions   Prochlorperazine  Anxiety   Panic attacks   Contrast Media [iodinated Contrast Media] Hives, Swelling        Medication List        Accurate as of July 25, 2024 11:59 PM. If you have any questions, ask your nurse or doctor.          acetaminophen  500 MG tablet Commonly known as: TYLENOL  Take 1,000 mg by mouth every 6 (six) hours as needed for mild pain (pain score 1-3) or moderate pain (pain score 4-6).   Aspirin  Low Dose 81 MG chewable tablet Generic drug: aspirin  Chew 1 tablet (81 mg total) by mouth daily.   bismuth subsalicylate 262 MG/15ML suspension Commonly known as: PEPTO BISMOL Take 30 mLs by mouth every 6 (six) hours as needed for indigestion or diarrhea or loose stools.   carvedilol  25 MG tablet Commonly known as: COREG  Take 1 tablet (25 mg total) by mouth 2 (two) times daily.   FreeStyle Libre 3 Plus Sensor Misc Replace every 15 days What changed: Another medication with the same name was changed. Make sure you understand how and when to take each. Changed by: Roxan BROCKS Logen Heintzelman   Dexcom G7 Sensor Misc 1 Application by Does not apply route daily. What changed:  how much to take when to take this Changed by: Roxan BROCKS Taneshia Lorence   FreeStyle Libre 3 Reader [facility] Use as directed for continuous glucose monitoring.   gabapentin  300 MG capsule Commonly known as: NEURONTIN  Take 1 capsule (300 mg total) by mouth 2 (two) times daily.   hydrALAZINE  25 MG tablet Commonly known as: APRESOLINE  Take 1 tablet (25 mg total) by mouth 2 (two) times daily.   hydrOXYzine  10 MG tablet Commonly known as: ATARAX  Take 1  tablet (10 mg total) by mouth as needed for anxiety.   Insulin  Glargine Solostar 100 UNIT/ML Solostar Pen Commonly known as: LANTUS  Inject 45 Units into the skin in the morning and at bedtime.   insulin  lispro 100 UNIT/ML KwikPen Commonly known as: HUMALOG  Inject 16 Units into the skin 3 (three) times daily.   linezolid 600 MG tablet Commonly known as: ZYVOX Take 600 mg by mouth 2 (two) times daily.   loperamide  2 MG capsule Commonly known as: IMODIUM  Take 1 capsule (2 mg total) by mouth 4 (four) times daily as needed for diarrhea or loose stools.   nystatin  cream Commonly known as: MYCOSTATIN  Apply to affected area 2 times daily   rosuvastatin  20 MG tablet Commonly known as: CRESTOR  Take 1 tablet (20 mg total) by mouth daily.   senna-docusate 8.6-50 MG tablet Commonly known as: Senokot-S Take 1-2 tablets by mouth 2 (two) times daily between meals as needed for mild constipation or moderate constipation.   spironolactone  50 MG tablet Commonly known as: ALDACTONE  Take 1 tablet (50 mg total) by mouth daily.   torsemide  20  MG tablet Commonly known as: DEMADEX  Take 2 tablets (40 mg total) by mouth daily.   valsartan  320 MG tablet Commonly known as: DIOVAN  Take 1 tablet (320 mg total) by mouth daily.   Vascepa 1 g capsule Generic drug: icosapent Ethyl Take 2 g by mouth 2 (two) times daily.   Ventolin  HFA 108 (90 Base) MCG/ACT inhaler Generic drug: albuterol  Inhale 2 puffs into the lungs every 4 (four) hours as needed for wheezing or shortness of breath.        Review of Systems  Constitutional:  Negative for appetite change, chills, fatigue, fever and unexpected weight change.  HENT:  Negative for congestion, dental problem, ear discharge, ear pain, facial swelling, hearing loss, nosebleeds, postnasal drip, rhinorrhea, sinus pressure, sinus pain, sneezing, sore throat, tinnitus and trouble swallowing.   Eyes:  Negative for pain, discharge, redness, itching and  visual disturbance.  Respiratory:  Negative for cough, chest tightness, shortness of breath and wheezing.   Cardiovascular:  Negative for chest pain, palpitations and leg swelling.  Gastrointestinal:  Negative for abdominal distention, abdominal pain, blood in stool, constipation, diarrhea, nausea and vomiting.  Endocrine: Negative for cold intolerance, heat intolerance, polydipsia, polyphagia and polyuria.  Genitourinary:  Negative for difficulty urinating, dysuria, flank pain, frequency and urgency.  Musculoskeletal:  Negative for arthralgias, back pain, gait problem, joint swelling, myalgias, neck pain and neck stiffness.  Skin:  Negative for color change, pallor, rash and wound.  Neurological:  Negative for dizziness, syncope, speech difficulty, weakness, light-headedness, numbness and headaches.  Hematological:  Does not bruise/bleed easily.  Psychiatric/Behavioral:  Negative for agitation, behavioral problems, confusion, hallucinations, self-injury, sleep disturbance and suicidal ideas. The patient is nervous/anxious.     Immunization History  Administered Date(s) Administered   INFLUENZA, HIGH DOSE SEASONAL PF 07/25/2024   Influenza, Seasonal, Injecte, Preservative Fre 10/13/2023   Influenza,inj,Quad PF,6+ Mos 06/16/2017   PNEUMOCOCCAL CONJUGATE-20 10/13/2023   Pneumococcal Polysaccharide-23 06/17/2017   Pertinent  Health Maintenance Due  Topic Date Due   FOOT EXAM  10/28/2024   OPHTHALMOLOGY EXAM  11/18/2024   HEMOGLOBIN A1C  01/14/2025   Influenza Vaccine  Completed      11/15/2023    9:04 AM 12/05/2023    2:24 PM 05/14/2024   11:02 AM 07/23/2024    1:59 PM 07/25/2024   10:44 AM  Fall Risk  Falls in the past year?  0 0 0 0  Was there an injury with Fall? 0 0 0 0 0  Fall Risk Category Calculator  0 0 0 0  Patient at Risk for Falls Due to No Fall Risks No Fall Risks No Fall Risks  No Fall Risks  Fall risk Follow up Falls evaluation completed Falls evaluation completed Falls  evaluation completed  Falls evaluation completed   Functional Status Survey:    Vitals:   07/25/24 1051  BP: 138/88  Pulse: 84  Resp: 20  Temp: 97.7 F (36.5 C)  SpO2: 95%  Weight: 294 lb 12.8 oz (133.7 kg)  Height: 5' 7.5 (1.715 m)   Body mass index is 45.49 kg/m. Physical Exam GENERAL: Alert, cooperative, well developed, no acute distress. HEENT: Normocephalic, normal oropharynx, moist mucous membranes. CHEST: Clear to auscultation bilaterally, no wheezes, rhonchi, or crackles. CARDIOVASCULAR: Normal heart rate and rhythm, S1 and S2 normal without murmurs. ABDOMEN: Soft, non-tender, non-distended, without organomegaly, normal bowel sounds. EXTREMITIES: No cyanosis or edema, cellulitis resolved in legs. NEUROLOGICAL: Cranial nerves grossly intact, moves all extremities without gross motor or sensory deficit.  SKIN: No rash,no lesion or erythema   PSYCHIATRY/BEHAVIORAL: Mood stable    Labs reviewed: Recent Labs    07/20/24 1101 07/21/24 0556 07/22/24 0245 07/25/24 1128  NA 137 135 137 130*  K 3.4* 3.9 3.8 4.3  CL 101 102 103 98  CO2 26 22 25 25   GLUCOSE 253* 239* 245* 472*  BUN 11 13 13 16   CREATININE 0.89 0.88 1.04* 0.90  CALCIUM  8.4* 8.3* 8.4* 8.8  MG 1.6* 2.2 1.8  --   PHOS 4.3 3.6 3.7  --    Recent Labs    07/15/24 1803 07/16/24 1508 07/17/24 0301 07/18/24 0239 07/20/24 1101 07/21/24 0556 07/22/24 0245 07/25/24 1128  AST 19 19 16   --   --   --   --  15  ALT 13 15 16   --   --   --   --  20  ALKPHOS 94 64 66  --   --   --   --   --   BILITOT 0.5 0.4 0.2  --   --   --   --  0.2  PROT 6.7 5.9* 5.6*  --   --   --   --  6.7  ALBUMIN 3.2* 1.9* 1.8*   < > 1.9* 1.9* 2.0*  --    < > = values in this interval not displayed.   Recent Labs    05/23/24 0951 07/15/24 1803 07/16/24 1508 07/21/24 0556 07/22/24 0245 07/25/24 1128  WBC 7.8 22.9*   < > 13.6* 13.0* 11.6*  NEUTROABS 4.6 20.3*  --   --   --  7,494  HGB 9.5* 11.0*   < > 10.0* 10.9* 10.8*  HCT  29.5* 32.7*   < > 31.6* 34.0* 34.7*  MCV 79.7* 79.4*   < > 82.1 81.3 84.4  PLT 262 349   < > 378 402* 421*   < > = values in this interval not displayed.   Lab Results  Component Value Date   TSH 0.298 (L) 07/16/2024   Lab Results  Component Value Date   HGBA1C 13.5 (H) 07/16/2024   Lab Results  Component Value Date   CHOL 178 10/29/2023   HDL 40 (L) 10/29/2023   LDLCALC  10/29/2023     Comment:     . LDL cholesterol not calculated. Triglyceride levels greater than 400 mg/dL invalidate calculated LDL results. . Reference range: <100 . Desirable range <100 mg/dL for primary prevention;   <70 mg/dL for patients with CHD or diabetic patients  with > or = 2 CHD risk factors. SABRA LDL-C is now calculated using the Martin-Hopkins  calculation, which is a validated novel method providing  better accuracy than the Friedewald equation in the  estimation of LDL-C.  Gladis APPLETHWAITE et al. SANDREA. 7986;689(80): 2061-2068  (http://education.[facility].com/faq/FAQ164)    LDLDIRECT 64.0 03/26/2023   TRIG 417 (H) 10/29/2023   CHOLHDL 4.5 10/29/2023    Significant Diagnostic Results in last 30 days:  VAS US  LOWER EXTREMITY VENOUS (DVT) Result Date: 07/17/2024  Lower Venous DVT Study Patient Name:  Carolyn Zimmerman  Date of Exam:   07/17/2024 Medical Rec #: [id number]     Accession #:    [id number] Date of Birth: [birth year]     Patient Gender: F Patient Age:   64 years Exam Location:  [facility] Procedure:      VAS US  LOWER EXTREMITY VENOUS (DVT) Referring Phys: MIGNON GONFA --------------------------------------------------------------------------------  Indications: Asymmetric leg swelling. Sepsis secondary to right lower  extremity cellulitis.  Risk Factors: Right venous reflux disease diagnosed 02/21/24. Limitations: Body habitus. Comparison Study: Prior negative right LEV done 10/31/23 and 10/13/23 Performing Technologist: Edilia Elden Appl  Examination Guidelines: A complete evaluation  includes B-mode imaging, spectral Doppler, color Doppler, and power Doppler as needed of all accessible portions of each vessel. Bilateral testing is considered an integral part of a complete examination. Limited examinations for reoccurring indications may be performed as noted. The reflux portion of the exam is performed with the patient in reverse Trendelenburg.  +---------+---------------+---------+-----------+----------+--------------+ RIGHT    CompressibilityPhasicitySpontaneityPropertiesThrombus Aging +---------+---------------+---------+-----------+----------+--------------+ CFV      Full           Yes      Yes                                 +---------+---------------+---------+-----------+----------+--------------+ SFJ      Full           Yes      Yes                                 +---------+---------------+---------+-----------+----------+--------------+ FV Prox  Full                                                        +---------+---------------+---------+-----------+----------+--------------+ FV Mid   Full                                                        +---------+---------------+---------+-----------+----------+--------------+ FV DistalFull                                                        +---------+---------------+---------+-----------+----------+--------------+ PFV      Full                                                        +---------+---------------+---------+-----------+----------+--------------+ POP      Full           Yes      Yes                                 +---------+---------------+---------+-----------+----------+--------------+ PTV      Full                                                        +---------+---------------+---------+-----------+----------+--------------+ PERO     Full                                                         +---------+---------------+---------+-----------+----------+--------------+   +---------+---------------+---------+-----------+----------+--------------+  LEFT     CompressibilityPhasicitySpontaneityPropertiesThrombus Aging +---------+---------------+---------+-----------+----------+--------------+ CFV      Full           Yes      Yes                                 +---------+---------------+---------+-----------+----------+--------------+ SFJ      Full           Yes      Yes                                 +---------+---------------+---------+-----------+----------+--------------+ FV Prox  Full                                                        +---------+---------------+---------+-----------+----------+--------------+ FV Mid   Full                                                        +---------+---------------+---------+-----------+----------+--------------+ FV DistalFull                                                        +---------+---------------+---------+-----------+----------+--------------+ PFV      Full           Yes      Yes                                 +---------+---------------+---------+-----------+----------+--------------+ POP      Full                                                        +---------+---------------+---------+-----------+----------+--------------+ PTV      Full                                                        +---------+---------------+---------+-----------+----------+--------------+ PERO     Full                                                        +---------+---------------+---------+-----------+----------+--------------+     Summary: BILATERAL: - No evidence of deep vein thrombosis seen in the lower extremities, bilaterally. -No evidence of popliteal cyst, bilaterally.   *See table(s) above for measurements and observations. Electronically signed by Debby Robertson on 07/17/2024 at 8:15:19 PM.     Final    CT TIBIA  FIBULA RIGHT WO CONTRAST Result Date: 07/17/2024 EXAM: CT RIGHT LOWER EXTREMITY, WITHOUT IV CONTRAST 07/17/2024 12:01:47 PM TECHNIQUE: Axial images were acquired through the right lower extremity without IV contrast. Reformatted images were reviewed. Automated exposure control, iterative reconstruction, and/or weight based adjustment of the mA/kV was utilized to reduce the radiation dose to as low as reasonably achievable. COMPARISON: 10/11/2023 CLINICAL HISTORY: Soft tissue infection suspected, lower leg. FINDINGS: BONES AND JOINTS: No acute fracture or focal osseous lesion. No bony destructive findings to suggest osteomyelitis. No dislocation. The joint spaces are normal. Trace knee joint effusion. SOFT TISSUES: Subcutaneous edema observed anteriorly in the distal thigh, anterior to the patellar tendon, and tracking along the intermedial and anterior tibial margin and a pattern roughly similar to the 10/11/2023 exam. This likewise tracks along the superficial fascial margin of the tibialis anterior muscle and extends down into the dorsum of the ankle. Low grade posterior subcutaneous edema in the calf is nonspecific. No abnormal gas tracking in the soft tissues. No drainable abscess observed. No obvious intramuscular process or effacement of fat planes along the compartments of the calf. Upper popliteal lymph node 0.9 cm in short axis on image 37 series 3, possibly reactive, previously the same size. IMPRESSION: 1. No acute osseous abnormality. 2. Subcutaneous edema in the distal thigh and along the anterior tibial margin, similar to prior, without abscess or soft-tissue gas. 3. No intramuscular process. 4. Trace knee joint effusion. 5. Upper popliteal lymph node likely reactive, unchanged. Electronically signed by: Ryan Salvage MD 07/17/2024 12:24 PM EDT RP Workstation: [id number]   ECHOCARDIOGRAM COMPLETE Result Date: 07/17/2024    ECHOCARDIOGRAM REPORT   Patient Name:   Carolyn Zimmerman Date of Exam: 07/17/2024 Medical Rec #:  [id number]    Height:       67.5 in Accession #:    [id number]   Weight:       296.7 lb Date of Birth:  March 27, 1986    BSA:          2.405 m Patient Age:    38 years     BP:           159/95 mmHg Patient Gender: F            HR:           85 bpm. Exam Location:  Inpatient Procedure: 2D Echo, Cardiac Doppler and Color Doppler (Both Spectral and Color            Flow Doppler were utilized during procedure). Indications:    Other abnormalities of the heart  History:        Patient has prior history of Echocardiogram examinations, most                 recent 11/01/2023. CHF; Risk Factors:Diabetes, Dyslipidemia,                 Hypertension and Former Smoker.  Sonographer:    Juliene Rucks Referring Phys: [id number] MIGNON DASEN GONFA  Sonographer Comments: Patient is obese. Image acquisition challenging due to patient body habitus. IMPRESSIONS  1. Left ventricular ejection fraction, by estimation, is 60 to 65%. The left ventricle has normal function. The left ventricle has no regional wall motion abnormalities. The left ventricular internal cavity size was severely dilated. There is mild concentric left ventricular hypertrophy. Left ventricular diastolic parameters were normal.  2. Right ventricular systolic function is normal. The right ventricular size is normal. Tricuspid regurgitation signal is inadequate for assessing PA pressure.  3. Left atrial size was mildly dilated.  4. The mitral valve is normal in structure. Trivial mitral valve regurgitation. No evidence of mitral stenosis.  5. The aortic valve is normal in structure. Aortic valve regurgitation is not visualized. No aortic stenosis is present.  6. The inferior vena cava is normal in size with greater than 50% respiratory variability, suggesting right atrial pressure of 3 mmHg.  7. A small pericardial effusion is present. The pericardial effusion is circumferential. There is no evidence of cardiac tamponade. FINDINGS  Left  Ventricle: Left ventricular ejection fraction, by estimation, is 60 to 65%. The left ventricle has normal function. The left ventricle has no regional wall motion abnormalities. The left ventricular internal cavity size was severely dilated. There is mild concentric left ventricular hypertrophy. Left ventricular diastolic parameters were normal. Normal left ventricular filling pressure. Right Ventricle: The right ventricular size is normal. No increase in right ventricular wall thickness. Right ventricular systolic function is normal. Tricuspid regurgitation signal is inadequate for assessing PA pressure. Left Atrium: Left atrial size was mildly dilated. Right Atrium: Right atrial size was normal in size. Pericardium: A small pericardial effusion is present. The pericardial effusion is circumferential. There is no evidence of cardiac tamponade. Mitral Valve: The mitral valve is normal in structure. Trivial mitral valve regurgitation. No evidence of mitral valve stenosis. Tricuspid Valve: The tricuspid valve is normal in structure. Tricuspid valve regurgitation is not demonstrated. No evidence of tricuspid stenosis. Aortic Valve: The aortic valve is normal in structure. Aortic valve regurgitation is not visualized. No aortic stenosis is present. Aortic valve mean gradient measures 7.0 mmHg. Aortic valve peak gradient measures 13.5 mmHg. Aortic valve area, by VTI measures 2.78 cm. Pulmonic Valve: The pulmonic valve was normal in structure. Pulmonic valve regurgitation is not visualized. No evidence of pulmonic stenosis. Aorta: The aortic root is normal in size and structure. Venous: The inferior vena cava is normal in size with greater than 50% respiratory variability, suggesting right atrial pressure of 3 mmHg. IAS/Shunts: No atrial level shunt detected by color flow Doppler.  LEFT VENTRICLE PLAX 2D LVIDd:         6.20 cm   Diastology LVIDs:         4.10 cm   LV e' medial:    9.03 cm/s LV PW:         1.50 cm   LV  E/e' medial:  10.5 LV IVS:        1.20 cm   LV e' lateral:   6.85 cm/s LVOT diam:     2.10 cm   LV E/e' lateral: 13.8 LV SV:         91 LV SV Index:   38 LVOT Area:     3.46 cm LV IVRT:       106 msec  RIGHT VENTRICLE RV Basal diam:  3.90 cm RV Mid diam:    3.60 cm RV S prime:     19.40 cm/s TAPSE (M-mode): 3.2 cm LEFT ATRIUM             Index        RIGHT ATRIUM           Index LA Vol (A2C):   96.8 ml 40.26 ml/m  RA Area:     15.20 cm LA Vol (A4C):   72.8 ml 30.27 ml/m  RA Volume:   35.90 ml  14.93 ml/m LA Biplane Vol: 86.4 ml 35.93 ml/m  AORTIC VALVE AV Area (Vmax):  2.60 cm AV Area (Vmean):   2.54 cm AV Area (VTI):     2.78 cm AV Vmax:           184.00 cm/s AV Vmean:          127.000 cm/s AV VTI:            0.328 m AV Peak Grad:      13.5 mmHg AV Mean Grad:      7.0 mmHg LVOT Vmax:         138.00 cm/s LVOT Vmean:        93.200 cm/s LVOT VTI:          0.263 m LVOT/AV VTI ratio: 0.80  AORTA Ao Root diam: 3.00 cm Ao Asc diam:  3.00 cm MITRAL VALVE MV Area (PHT): 3.83 cm    SHUNTS MV Decel Time: 198 msec    Systemic VTI:  0.26 m MR Peak grad: 33.9 mmHg    Systemic Diam: 2.10 cm MR Vmax:      291.00 cm/s MV E velocity: 94.70 cm/s MV A velocity: 99.40 cm/s MV E/A ratio:  0.95 Wilbert Bihari MD Electronically signed by Wilbert Bihari MD Signature Date/Time: 07/17/2024/9:58:47 AM    Final    CT Chest Wo Contrast Result Date: 07/15/2024 EXAM: CT CHEST WITHOUT CONTRAST 07/15/2024 10:46:16 PM TECHNIQUE: CT of the chest was performed without the administration of intravenous contrast. Multiplanar reformatted images are provided for review. Automated exposure control, iterative reconstruction, and/or weight based adjustment of the mA/kV was utilized to reduce the radiation dose to as low as reasonably achievable. COMPARISON: Chest x-ray dated 07/15/2024 and Cardiac CT dated 06/09/2024. CLINICAL HISTORY: Pneumonia, complication suspected, xray done. Pneumonia; Triage note:; C/o cough, congestion, body aches  starting this morning. FINDINGS: MEDIASTINUM: The heart is mildly enlarged. There is a small pericardial effusion similar to prior. The central airways are clear. Thyroid gland is prominent in size. LYMPH NODES: There are enlarged prevascular lymph nodes measuring up to 14 mm. Bilateral axillary lymph nodes are prominent but nonenlarged. LUNGS AND PLEURA: No focal consolidation or pulmonary edema. No pleural effusion or pneumothorax. SOFT TISSUES/BONES: No acute abnormality of the bones or soft tissues. UPPER ABDOMEN: Limited images of the upper abdomen demonstrates no acute abnormality. IMPRESSION: 1. No acute findings. 2. Mild cardiomegaly with small pericardial effusion, similar to prior. 3. Prominent thyroid gland. Consider non-emergent thyroid ultrasound given thyroid enlargement. 4. Enlarged prevascular lymph nodes measuring up to 14 mm. Correlate clinically and consider follow-up as indicated. Electronically signed by: Greig Pique MD 07/15/2024 10:49 PM EDT RP Workstation: [id number]   CT ABDOMEN PELVIS WO CONTRAST Result Date: 07/15/2024 EXAM: CT ABDOMEN AND PELVIS WITHOUT CONTRAST 07/15/2024 08:17:19 PM TECHNIQUE: CT of the abdomen and pelvis was performed without the administration of intravenous contrast. Multiplanar reformatted images are provided for review. Automated exposure control, iterative reconstruction, and/or weight-based adjustment of the mA/kV was utilized to reduce the radiation dose to as low as reasonably achievable. COMPARISON: CT abdomen and pelvis. CLINICAL HISTORY: Abdominal pain, acute, nonlocalized. Triage note: C/o cough, congestion, body aches starting this morning. FINDINGS: LOWER CHEST: There is a trace pericardial effusion. LIVER: The liver is enlarged. GALLBLADDER AND BILE DUCTS: Gallbladder is surgically absent. No biliary ductal dilatation. SPLEEN: No acute abnormality. PANCREAS: No acute abnormality. ADRENAL GLANDS: No acute abnormality. KIDNEYS, URETERS AND BLADDER: No  stones in the kidneys or ureters. No hydronephrosis. There is minimal bilateral perinephric fat stranding which is nonspecific and unchanged. Urinary bladder is unremarkable. GI  AND BOWEL: Stomach demonstrates no acute abnormality. The appendix appears. There is no bowel obstruction. PERITONEUM AND RETROPERITONEUM: There is trace free fluid in the pelvis. No free air. VASCULATURE: Aorta is normal in caliber. LYMPH NODES: Enlarged bilateral inguinal lymph nodes measuring up to 18 mm short axis. REPRODUCTIVE ORGANS: No acute abnormality. BONES AND SOFT TISSUES: No acute osseous abnormality. There is mild subcutaneous stranding in the lower anterior abdominal wall. IMPRESSION: 1. Enlarged bilateral inguinal lymph nodes measuring up to 18 mm short axis. 2. Trace pericardial effusion. 3. Hepatomegaly. 4. Minimal bilateral perinephric fat stranding, nonspecific and unchanged. Correlate clinically for infection. Electronically signed by: Greig Pique MD 07/15/2024 08:23 PM EDT RP Workstation: [id number]   DG Chest 2 View Result Date: 07/15/2024 EXAM: 2 VIEW(S) XRAY OF THE CHEST 07/15/2024 06:09:00 PM COMPARISON: Chest x-ray 02/01/2024. CLINICAL HISTORY: shortness of breath, bilateral leg swelling, chest pain. C/o cough, congestion, body aches starting this morning. FINDINGS: LUNGS AND PLEURA: No focal pulmonary opacity. No pulmonary edema. No pleural effusion. No pneumothorax. HEART AND MEDIASTINUM: Heart is borderline enlarged, unchanged. BONES AND SOFT TISSUES: No acute osseous abnormality. IMPRESSION: 1. No acute process. 2. Borderline enlarged heart, unchanged. Electronically signed by: Greig Pique MD 07/15/2024 06:22 PM EDT RP Workstation: [id number]    Assessment/Plan Hospital Follow-up for Chronic Systolic Heart Failure and Type 2 Diabetes Mellitus with Hyperglycemia Chronic systolic heart failure with ejection fraction 42-45%. Recent hospitalization for generalized body pain, chest congestion, and  cellulitis. Blood sugars remain elevated between 250-320 mg/dL. Last A1c was 13.5%. Intolerance to metformin  due to pancreatitis. Insulin  regimen includes Lantus  38 units at bedtime and Humalog  16 units three times daily. Endocrinologist recommended injectable medication, but not yet prescribed due to pancreatitis history. - Continue valsartan , carvedilol , Imdra, Crestor , Farxiga, and aspirin . - Continue insulin  regimen: Lantus  38 units at bedtime and Humalog  16 units three times daily. - Refilled continuous glucose sensor (Dexcom). - Encouraged lifestyle changes for weight management.  Cellulitis of Right Lower Limb, resolving Cellulitis of the right lower limb, previously treated with IV antibiotics including vancomycin  and oral antibiotics (Xyfox and Augmentin ). Currently resolving with no signs of abscess on CT scan. Doppler ultrasound negative for blood clot. - Complete remaining course of Xyfox. - Elevate leg when seated to reduce swelling.  Essential Hypertension Hypertension managed with amlodipine , carvedilol , Aldactone , and tosemide. Amlodipine  was recommended to be discontinued due to leg swelling, but she has not yet stopped it. - Continue amlodipine , carvedilol , Aldactone , and tosemide. - Monitor for leg swelling and consider discontinuing amlodipine  if swelling persists.  Mixed Hyperlipidemia Managed with Crestor . - Continue Crestor .  Enlarged Thyroid Gland with Abnormal Thyroid Function Tests Enlarged thyroid gland with low TSH and normal free T4, suggestive of 6 syndrome. Endocrinologist recommended repeat thyroid panel and thyroid ultrasound. - Ordered repeat thyroid panel in 4-6 weeks. - Ordered thyroid ultrasound without contrast.  Hepatomegaly likely secondary to Fatty Liver Suspected hepatomegaly, possibly secondary to fatty liver. - Referred to gastroenterologist for further evaluation.  Bilateral Inguinal Lymphadenopathy, likely reactive Bilateral inguinal  lymphadenopathy likely reactive due to cellulitis. No significant tenderness reported. - Will order CT scan of the abdomen in one month to assess lymphadenopathy.  Anxiety Disorder Anxiety symptoms are severe, requiring increased medication use. - Continue current anxiety medication regimen.  Personal History of TIA and Stroke without Residual Deficits No residual deficits from previous TIA and stroke. - Continue aspirin  therapy.  General Health Maintenance Discussed lifestyle changes for weight management and exercise. She exercises once or  twice a week. - Encouraged regular exercise and dietary modifications for weight management.   Family/ staff Communication: Reviewed plan of care with patient verbalized understanding  Labs/tests ordered:  - CBC with Differential/Platelet - CMP with eGFR([facility]) - TSH - Hgb A1C - Lipid panel - Vitamin B 12 - Hep C Antibody - HIV Antibody (routine testing w rflx) - CT abdomen  Ultrasound of the thyroid   Next Appointment : Return if symptoms worsen or fail to improve.   Spent 30 minutes of Face to face and non-face to face with patient  >50% time spent counseling; reviewing medical record; tests; labs; documentation and developing future plan of care.   Roxan JAYSON Plough, NP

## 2024-08-04 ENCOUNTER — Encounter: Payer: Self-pay | Admitting: Pharmacist

## 2024-08-04 ENCOUNTER — Other Ambulatory Visit: Payer: Self-pay | Admitting: Pharmacist

## 2024-08-04 DIAGNOSIS — E0849 Diabetes mellitus due to underlying condition with other diabetic neurological complication: Secondary | ICD-10-CM

## 2024-08-04 DIAGNOSIS — E1165 Type 2 diabetes mellitus with hyperglycemia: Secondary | ICD-10-CM

## 2024-08-04 NOTE — Progress Notes (Signed)
 error

## 2024-08-04 NOTE — Progress Notes (Unsigned)
 08/04/2024 Name: Carolyn Zimmerman MRN: [id number] DOB: 1985/11/17  Chief Complaint  Patient presents with   Medication Management    Diabetes     Carolyn Zimmerman is a 38 y.o. year old female who presented for a telephone visit.   They were referred to the pharmacist by their PCP for assistance in managing diabetes.    Subjective:  Carolyn Zimmerman is a 38 year old female with multiple medical conditions including but not limited to: type 2 diabetes, hypertension, hyperlipidemia, carotid artery stenosis, hyperlipidemia, congested heart failure, and depression.  She was recently hospitalized for a CHF exacerbation.     Care Team: Primary Care Provider: Leonarda Roxan BROCKS, NP ; Next Scheduled Visit: 11/15/2023  Medication Access/Adherence  Current Pharmacy:  [id number] [place], [place] - [address] AT [facility] [address] & [place] [address] [place] [address] Phone: [phone] Fax: [phone]   Patient reports affordability concerns with their medications: No  Patient reports access/transportation concerns to their pharmacy: No  Patient reports adherence concerns with their medications:  Yes   Often does not want to take the Torsemide  because it makes her urinate and she is often not where she feels comfortable using the bathroom.    Diabetes:  Current medications:   Lantus  50 units twice daily Humalog  20 units three times daily with meals  Current glucose readings: Uses Dexcom but [facility] does not have a portal set up at this time.  Patient reported her blood sugars have been better.    Patient denies hypoglycemic s/sx including  dizziness, shakiness, sweating. Patient reports hyperglycemic symptoms including polyuria and nocturia.    Macrovascular and Microvascular Risk Reduction:  Statin? yes (Rosuvastatin  --fill history data does not match Patient's report); also on Vascepa ACEi/ARB? yes (Valsartan  320 mg  )--fill history data and lab values do not support her taking this despite her report. Last urinary albumin/creatinine ratio:  Lab Results  Component Value Date   MICRALBCREAT 2,000 (H) 05/14/2024   MICRALBCREAT 1,723 (H) 11/02/2022   Last eye exam:  Lab Results  Component Value Date   HMDIABEYEEXA Retinopathy (A) 11/19/2023   Last foot exam: 10/29/2023 Tobacco Use:  Tobacco Use: Medium Risk (07/25/2024)   Patient History    Smoking Tobacco Use: Former    Smokeless Tobacco Use: Never    Passive Exposure: Not on file   Hypertension:    07/25/2024   10:51 AM 07/23/2024    1:52 PM 07/22/2024    7:18 AM  Vitals with BMI  Height 5' 7.5    Weight 294 lbs 13 oz --   BMI 45.46    Systolic 138  153  Diastolic 88  83  Pulse 84  78    Current medications:  Hydralazine  25 mg 1 tablet three times daily Valsartan  320 g 1 tablet daily Spironolactone  50 mg 1 tablet daily   Hyperlipidemia/ASCVD Risk Reduction Lipid Panel     Component Value Date/Time   CHOL 178 10/29/2023 1148   CHOL 172 12/29/2021 0919   TRIG 417 (H) 10/29/2023 1148   HDL 40 (L) 10/29/2023 1148   HDL 34 (L) 12/29/2021 0919   CHOLHDL 4.5 10/29/2023 1148   VLDL 45 (H) 10/14/2023 0554   LDLCALC  10/29/2023 1148     Comment:     . LDL cholesterol not calculated. Triglyceride levels greater than 400 mg/dL invalidate calculated LDL results. . Reference range: <100 . Desirable range <100 mg/dL for primary  prevention;   <70 mg/dL for patients with CHD or diabetic patients  with > or = 2 CHD risk factors. SABRA LDL-C is now calculated using the Martin-Hopkins  calculation, which is a validated novel method providing  better accuracy than the Friedewald equation in the  estimation of LDL-C.  Gladis APPLETHWAITE et al. SANDREA. 7986;689(80): 2061-2068  (http://education.[facility].com/faq/FAQ164)    LDLDIRECT 64.0 03/26/2023 1009   LABVLDL 38 12/29/2021 0919     Current lipid lowering medications:  Rosuvastatin   20 mg daily (SUPD/SPC gap closed) Vascepa 2 g twice daily   Antiplatelet regimen:   Aspirin  81 mg   Objective:  Lab Results  Component Value Date   HGBA1C 13.5 (H) 07/16/2024    Lab Results  Component Value Date   CREATININE 0.90 07/25/2024   BUN 16 07/25/2024   NA 130 (L) 07/25/2024   K 4.3 07/25/2024   CL 98 07/25/2024   CO2 25 07/25/2024    Lab Results  Component Value Date   CHOL 178 10/29/2023   HDL 40 (L) 10/29/2023   LDLCALC  10/29/2023     Comment:     . LDL cholesterol not calculated. Triglyceride levels greater than 400 mg/dL invalidate calculated LDL results. . Reference range: <100 . Desirable range <100 mg/dL for primary prevention;   <70 mg/dL for patients with CHD or diabetic patients  with > or = 2 CHD risk factors. SABRA LDL-C is now calculated using the Martin-Hopkins  calculation, which is a validated novel method providing  better accuracy than the Friedewald equation in the  estimation of LDL-C.  Gladis APPLETHWAITE et al. SANDREA. 7986;689(80): 2061-2068  (http://education.[facility].com/faq/FAQ164)    LDLDIRECT 64.0 03/26/2023   TRIG 417 (H) 10/29/2023   CHOLHDL 4.5 10/29/2023    Medications Reviewed Today     Reviewed by Jolee Cassius PARAS, [facility] (Pharmacist) on 08/04/24 at 1246  Med List Status: <None>   Medication Order Taking? Sig Documenting Provider Last Dose Status Informant  acetaminophen  (TYLENOL ) 500 MG tablet [id number] Yes Take 1,000 mg by mouth every 6 (six) hours as needed for mild pain (pain score 1-3) or moderate pain (pain score 4-6). [provider]  Active Self, Pharmacy Records  albuterol  (VENTOLIN  HFA) 108 [phone] Base) MCG/ACT inhaler [id number] Yes Inhale 2 puffs into the lungs every 4 (four) hours as needed for wheezing or shortness of breath. Trixie Nilda HERO, MD  Active Self, Pharmacy Records  aspirin  81 MG chewable tablet [id number] Yes Chew 1 tablet (81 mg total) by mouth daily. Ngetich, [name], NP  Active Self,  Pharmacy Records           Med Note MACK CASSIUS PARAS Pablo Nov 05, 2023  5:52 PM)    bismuth subsalicylate (PEPTO BISMOL) 262 MG/15ML suspension [id number] Yes Take 30 mLs by mouth every 6 (six) hours as needed for indigestion or diarrhea or loose stools. [provider]  Active Self, Pharmacy Records  carvedilol  (COREG ) 25 MG tablet [id number]  Take 1 tablet (25 mg total) by mouth 2 (two) times daily. Trixie Nilda HERO, MD  Active Self, Pharmacy Records  Continuous Glucose Receiver (FREESTYLE LIBRE 3 READER) [place] [id number]  Use as directed for continuous glucose monitoring.  Patient not taking: Reported on 07/25/2024   [provider]  Consider Medication Status and Discontinue (Patient Preference) Self, Pharmacy Records  Continuous Glucose Sensor (DEXCOM G7 SENSOR) MISC [id number] Yes 1 Application by Does not apply route daily. Ngetich, [name], NP  Active   Continuous  Glucose Sensor (FREESTYLE LIBRE 3 PLUS SENSOR) [place] [id number]  Replace every 15 days  Patient not taking: Reported on 07/25/2024   [provider]  Consider Medication Status and Discontinue (Patient Preference) Self, Pharmacy Records  gabapentin  (NEURONTIN ) 300 MG capsule [id number] Yes Take 1 capsule (300 mg total) by mouth 2 (two) times daily. Gonfa, Taye T, MD  Active   hydrALAZINE  (APRESOLINE ) 25 MG tablet [id number] Yes Take 1 tablet (25 mg total) by mouth 2 (two) times daily. Tobb, Kardie, DO  Active Self, Pharmacy Records  hydrOXYzine  (ATARAX ) 10 MG tablet [id number] Yes Take 1 tablet (10 mg total) by mouth as needed for anxiety. Ngetich, [name], NP  Active Self, Pharmacy Records           Med Note EFRAIM, ALFREIDA CROME   Wed Jul 16, 2024  4:19 PM) Recently the patient has been anxious more often so she has been taking it just about every day.  icosapent Ethyl (VASCEPA) 1 g capsule [id number] Yes Take 2 g by mouth 2 (two) times daily. [provider]  Active Self, Pharmacy Records  Insulin   Glargine Solostar (LANTUS ) 100 UNIT/ML Solostar Pen [id number] Yes Inject 45 Units into the skin in the morning and at bedtime.  Patient taking differently: Inject 50 Units into the skin in the morning and at bedtime.   Gonfa, Taye T, MD  Active   insulin  lispro (HUMALOG ) 100 UNIT/ML KwikPen [id number] Yes Inject 16 Units into the skin 3 (three) times daily.  Patient taking differently: Inject 20 Units into the skin 3 (three) times daily with meals.   Gonfa, Taye T, MD  Active   loperamide  (IMODIUM ) 2 MG capsule [id number] Yes Take 1 capsule (2 mg total) by mouth 4 (four) times daily as needed for diarrhea or loose stools. Mannie Fairy DASEN, DO  Active Self, Pharmacy Records  nystatin  cream (MYCOSTATIN ) [id number] Yes Apply to affected area 2 times daily Mannie Fairy DASEN, DO  Active Self, Pharmacy Records  rosuvastatin  (CRESTOR ) 20 MG tablet [id number] Yes Take 1 tablet (20 mg total) by mouth daily. Trixie Nilda HERO, MD  Active Self, Pharmacy Records  senna-docusate [facility]) 8.6-50 MG tablet [id number] Yes Take 1-2 tablets by mouth 2 (two) times daily between meals as needed for mild constipation or moderate constipation. Gonfa, Taye T, MD  Active   spironolactone  (ALDACTONE ) 50 MG tablet [id number] Yes Take 1 tablet (50 mg total) by mouth daily. Ngetich, [name], NP  Active Self, Pharmacy Records  torsemide  (DEMADEX ) 20 MG tablet [id number] Yes Take 2 tablets (40 mg total) by mouth daily. Ngetich, [name], NP  Active Self, Pharmacy Records           Med Note EFRAIM ALFREIDA CROME Stevan Jul 16, 2024  4:26 PM) Patient confirmed she did not stop taking this medication all together but she is not taking it every day but every other day.  valsartan  (DIOVAN ) 320 MG tablet [id number] Yes Take 1 tablet (320 mg total) by mouth daily. Ngetich, Roxan BROCKS, NP  Active Self, Pharmacy Records                07/25/2024   10:51 AM 07/23/2024    1:52 PM 07/22/2024    7:18 AM  Vitals with BMI  Height 5' 7.5     Weight 294 lbs 13 oz --   BMI 45.46    Systolic 138  153  Diastolic 88  83  Pulse 84  78  Assessment/Plan:   Diabetes: - Currently uncontrolled; goal A1c <7%. Cardiorenal risk reduction is opportunities for improvement.. Blood pressure is at goal (most recently) <130/80. LDL is not at goal.  - Encouraged Patient to use her insulin  as prescribed.  She requested a refill on both Lantus  and her fast acting insulin  due to increasing her dose. - Recommend to take medicaitons as prescribed.  Patient reported taking everything on her medicaiton ls tbut several medications do not have recent fills.  - Patient said she sent a message to her PCP requesting a medication for a yeast infection.  She recently finished a course of Linezolid. -She also mentioned that she spoke with a nurse who said her white blood cell count was still high despite finishing Linezolid. She wondered if she needs another antibiotic (Per chart review, the most recent lab work if from 10 days ago). -  Follow Up Plan:  Send refills to Provider cosignature required.  Ask about yeast infection treatment. Mention white blood cell count Follow up with the Patient in 3-5 business days.   Cassius DOROTHA Brought, PharmD, BCACP Clinical Pharmacist [phone]

## 2024-08-05 MED ORDER — INSULIN GLARGINE SOLOSTAR 100 UNIT/ML ~~LOC~~ SOPN: 50.0000 [IU] | PEN_INJECTOR | Freq: Two times a day (BID) | SUBCUTANEOUS | 3 refills | Status: DC

## 2024-08-05 MED ORDER — INSULIN LISPRO (1 UNIT DIAL) 100 UNIT/ML (KWIKPEN): 20.0000 [IU] | PEN_INJECTOR | Freq: Three times a day (TID) | SUBCUTANEOUS | 3 refills | Status: DC

## 2024-08-05 NOTE — Telephone Encounter (Signed)
 Mychart message sent to schedule office appointment to address concerns.

## 2024-09-05 ENCOUNTER — Encounter: Payer: Self-pay | Admitting: Cardiology

## 2024-09-05 ENCOUNTER — Other Ambulatory Visit (HOSPITAL_COMMUNITY): Payer: Self-pay

## 2024-09-05 ENCOUNTER — Ambulatory Visit: Attending: Cardiology | Admitting: Cardiology

## 2024-09-05 VITALS — BP 146/92 | HR 90 | Ht 67.0 in | Wt 278.3 lb

## 2024-09-05 DIAGNOSIS — Z8673 Personal history of transient ischemic attack (TIA), and cerebral infarction without residual deficits: Secondary | ICD-10-CM | POA: Diagnosis not present

## 2024-09-05 DIAGNOSIS — I10 Essential (primary) hypertension: Secondary | ICD-10-CM | POA: Diagnosis not present

## 2024-09-05 DIAGNOSIS — E782 Mixed hyperlipidemia: Secondary | ICD-10-CM | POA: Insufficient documentation

## 2024-09-05 MED ORDER — HYDRALAZINE HCL 50 MG PO TABS
50.0000 mg | ORAL_TABLET | Freq: Three times a day (TID) | ORAL | 3 refills | Status: DC
  Filled 2024-09-05: qty 270, 90d supply, fill #0

## 2024-09-05 MED ORDER — HYDRALAZINE HCL 50 MG PO TABS: 50.0000 mg | ORAL_TABLET | Freq: Three times a day (TID) | ORAL | 3 refills | Status: DC

## 2024-09-05 NOTE — Patient Instructions (Signed)
 Medication Instructions:  Your physician has recommended you make the following change in your medication:  START: Hydralazine  50 mg three times daily  *If you need a refill on your cardiac medications before your next appointment, please call your pharmacy*  Follow-Up: At [facility], you and your health needs are our priority.  As part of our continuing mission to provide you with exceptional heart care, our providers are all part of one team.  This team includes your primary Cardiologist (physician) and Advanced Practice Providers or APPs (Physician Assistants and Nurse Practitioners) who all work together to provide you with the care you need, when you need it.  Your next appointment:    Jan 14th  Provider:   Kardie Tobb, DO     Other Instructions Please send medication picture via MyChart.

## 2024-09-05 NOTE — Progress Notes (Unsigned)
 " Cardiology Office Note:    Date:  09/05/2024   ID:  Carolyn Zimmerman, DOB 18-Dec-1985, MRN [id number]  PCP:  Leonarda Roxan BROCKS, NP  Cardiologist:  Dub Huntsman, DO  Electrophysiologist:  None   Referring MD: Leonarda Roxan BROCKS, NP    I have had so much go on since I saw you  History of Present Illness:    Carolyn Zimmerman is a 38 y.o. female with a hx of hx of uncontrolled hypertension, CVA in October 2023 acute left cerebral hemisphere compatible with watershed infarct, diabetes mellitus, heart failure with midrange ejection fraction, smoker, obesity, hyperlipidemia.   Since her last visit with me she was admitted to [facility] for sepsis due to right leg cellulitis.  At that time she also appear volume overloaded she was treated with diuretics.  Her echocardiogram done in the hospital showed normal EF.   Past Medical History:  Diagnosis Date   Abscess of chest wall 06/15/2017   Cellulitis    Coronavirus infection 05/02/2020   CVA (cerebral vascular accident) (HCC) 07/14/2022   L MCA CVA, watershed infarct   Diabetes mellitus    Heart failure with mildly reduced ejection fraction (HFmrEF) (HCC) 09/29/2021   EF 45%, moderate LVH   Hyperlipidemia associated with type 2 diabetes mellitus (HCC)    Hypertension    Jaundice    Obesity    TOA (tubo-ovarian abscess) 09/2019    Past Surgical History:  Procedure Laterality Date   CHOLECYSTECTOMY     IRRIGATION AND DEBRIDEMENT ABSCESS N/A 05/02/2020   Procedure: IRRIGATION AND DEBRIDEMENT ABDOMINAL and CHEST WALL NECROTIZING FASCITITS;  Surgeon: Sheldon Standing, MD;  Location: [facility];  Service: General;  Laterality: N/A;   liver stent      Current Medications: Current Meds  Medication Sig   acetaminophen  (TYLENOL ) 500 MG tablet Take 1,000 mg by mouth every 6 (six) hours as needed for mild pain (pain score 1-3) or moderate pain (pain score 4-6).   albuterol  (VENTOLIN  HFA) 108 (90 Base) MCG/ACT inhaler Inhale 2 puffs into the lungs  every 4 (four) hours as needed for wheezing or shortness of breath.   aspirin  81 MG chewable tablet Chew 1 tablet (81 mg total) by mouth daily.   bismuth subsalicylate (PEPTO BISMOL) 262 MG/15ML suspension Take 30 mLs by mouth every 6 (six) hours as needed for indigestion or diarrhea or loose stools.   carvedilol  (COREG ) 25 MG tablet Take 1 tablet (25 mg total) by mouth 2 (two) times daily.   Continuous Glucose Sensor (DEXCOM G7 SENSOR) MISC 1 Application by Does not apply route daily.   gabapentin  (NEURONTIN ) 300 MG capsule Take 1 capsule (300 mg total) by mouth 2 (two) times daily.   hydrALAZINE  (APRESOLINE ) 25 MG tablet Take 1 tablet (25 mg total) by mouth 2 (two) times daily.   hydrOXYzine  (ATARAX ) 10 MG tablet Take 1 tablet (10 mg total) by mouth as needed for anxiety.   icosapent Ethyl (VASCEPA) 1 g capsule Take 2 g by mouth 2 (two) times daily.   Insulin  Glargine Solostar (LANTUS ) 100 UNIT/ML Solostar Pen Inject 50 Units into the skin in the morning and at bedtime.   insulin  lispro (HUMALOG ) 100 UNIT/ML KwikPen Inject 20 Units into the skin 3 (three) times daily with meals.   loperamide  (IMODIUM ) 2 MG capsule Take 1 capsule (2 mg total) by mouth 4 (four) times daily as needed for diarrhea or loose stools.   nystatin  cream (MYCOSTATIN ) Apply to affected area 2 times daily  rosuvastatin  (CRESTOR ) 20 MG tablet Take 1 tablet (20 mg total) by mouth daily.   senna-docusate (SENOKOT-S) 8.6-50 MG tablet Take 1-2 tablets by mouth 2 (two) times daily between meals as needed for mild constipation or moderate constipation.   spironolactone  (ALDACTONE ) 50 MG tablet Take 1 tablet (50 mg total) by mouth daily.   torsemide  (DEMADEX ) 20 MG tablet Take 2 tablets (40 mg total) by mouth daily.   valsartan  (DIOVAN ) 320 MG tablet Take 1 tablet (320 mg total) by mouth daily.     Allergies:   Prochlorperazine  and Contrast media [iodinated contrast media]   Social History   Socioeconomic History   Marital  status: Single    Spouse name: Not on file   Number of children: Not on file   Years of education: Not on file   Highest education level: GED or equivalent  Occupational History   Not on file  Tobacco Use   Smoking status: Former    Current packs/day: 0.00    Types: Cigarettes    Quit date: 10/08/2023    Years since quitting: 0.9   Smokeless tobacco: Never  Vaping Use   Vaping status: Former  Substance and Sexual Activity   Alcohol use: Not Currently    Comment: occ   Drug use: No   Sexual activity: Not on file  Other Topics Concern   Not on file  Social History Narrative   Are you right handed or left handed? right   Are you currently employed ? yes   What is your current occupation?[employer]   Do you live at home alone?with kids   Who lives with you?    What type of home do you live in: 1 story or 2 story? two    Caffeine 1-3 a day   Social Drivers of Health   Tobacco Use: Medium Risk (09/05/2024)   Patient History    Smoking Tobacco Use: Former    Smokeless Tobacco Use: Never    Passive Exposure: Not on file  Financial Resource Strain: Medium Risk (05/11/2024)   Overall Financial Resource Strain (CARDIA)    Difficulty of Paying Living Expenses: Somewhat hard  Food Insecurity: No Food Insecurity (07/23/2024)   Epic    Worried About Programme Researcher, Broadcasting/film/video in the Last Year: Never true    Ran Out of Food in the Last Year: Never true  Transportation Needs: No Transportation Needs (07/23/2024)   Epic    Lack of Transportation (Medical): No    Lack of Transportation (Non-Medical): No  Physical Activity: Insufficiently Active (05/11/2024)   Exercise Vital Sign    Days of Exercise per Week: 1 day    Minutes of Exercise per Session: 10 min  Stress: Stress Concern Present (05/11/2024)   [employer] of Occupational Health - Occupational Stress Questionnaire    Feeling of Stress: To some extent  Social Connections: Socially Isolated (05/11/2024)   Social Connection and Isolation  Panel    Frequency of Communication with Friends and Family: Once a week    Frequency of Social Gatherings with Friends and Family: Once a week    Attends Religious Services: 1 to 4 times per year    Active Member of Clubs or Organizations: No    Attends Banker Meetings: Not on file    Marital Status: Separated  Depression (PHQ2-9): Low Risk (07/25/2024)   Depression (PHQ2-9)    PHQ-2 Score: 0  Alcohol Screen: Low Risk (05/11/2024)   Alcohol Screen    Last  Alcohol Screening Score (AUDIT): 0  Housing: Unknown (07/23/2024)   Epic    Unable to Pay for Housing in the Last Year: No    Number of Times Moved in the Last Year: Not on file    Homeless in the Last Year: No  Recent Concern: Housing - High Risk (05/11/2024)   Epic    Unable to Pay for Housing in the Last Year: Yes    Number of Times Moved in the Last Year: 1    Homeless in the Last Year: No  Utilities: Not At Risk (07/23/2024)   Epic    Threatened with loss of utilities: No  Health Literacy: Not on file     Family History: The patient's family history includes Diabetes in her father; Hypertension in her father and mother.  ROS:   Review of Systems  Constitution: Negative for decreased appetite, fever and weight gain.  HENT: Negative for congestion, ear discharge, hoarse voice and sore throat.   Eyes: Negative for discharge, redness, vision loss in right eye and visual halos.  Cardiovascular: Negative for chest pain, dyspnea on exertion, leg swelling, orthopnea and palpitations.  Respiratory: Negative for cough, hemoptysis, shortness of breath and snoring.   Endocrine: Negative for heat intolerance and polyphagia.  Hematologic/Lymphatic: Negative for bleeding problem. Does not bruise/bleed easily.  Skin: Negative for flushing, nail changes, rash and suspicious lesions.  Musculoskeletal: Negative for arthritis, joint pain, muscle cramps, myalgias, neck pain and stiffness.  Gastrointestinal: Negative for  abdominal pain, bowel incontinence, diarrhea and excessive appetite.  Genitourinary: Negative for decreased libido, genital sores and incomplete emptying.  Neurological: Negative for brief paralysis, focal weakness, headaches and loss of balance.  Psychiatric/Behavioral: Negative for altered mental status, depression and suicidal ideas.  Allergic/Immunologic: Negative for HIV exposure and persistent infections.    EKGs/Labs/Other Studies Reviewed:    The following studies were reviewed today:   EKG:  None today   Recent Labs: 07/15/2024: Pro Brain Natriuretic Peptide 1,386.0 07/16/2024: B Natriuretic Peptide 141.4; TSH 0.298 07/22/2024: Magnesium  1.8 07/25/2024: ALT 20; BUN 16; Creat 0.90; Hemoglobin 10.8; Platelets 421; Potassium 4.3; Sodium 130  Recent Lipid Panel    Component Value Date/Time   CHOL 178 10/29/2023 1148   CHOL 172 12/29/2021 0919   TRIG 417 (H) 10/29/2023 1148   HDL 40 (L) 10/29/2023 1148   HDL 34 (L) 12/29/2021 0919   CHOLHDL 4.5 10/29/2023 1148   VLDL 45 (H) 10/14/2023 0554   LDLCALC  10/29/2023 1148     Comment:     . LDL cholesterol not calculated. Triglyceride levels greater than 400 mg/dL invalidate calculated LDL results. . Reference range: <100 . Desirable range <100 mg/dL for primary prevention;   <70 mg/dL for patients with CHD or diabetic patients  with > or = 2 CHD risk factors. SABRA LDL-C is now calculated using the Martin-Hopkins  calculation, which is a validated novel method providing  better accuracy than the Friedewald equation in the  estimation of LDL-C.  Gladis APPLETHWAITE et al. SANDREA. 7986;689(80): 2061-2068  (http://education.[employer].com/faq/FAQ164)    LDLDIRECT 64.0 03/26/2023 1009    Physical Exam:    VS:  BP (!) 148/104 (BP Location: Left Arm, Patient Position: Sitting, Cuff Size: Large)   Pulse 90   Ht 5' 7 (1.702 m)   Wt 278 lb 4.8 oz (126.2 kg)   SpO2 98%   BMI 43.59 kg/m     Wt Readings from Last 3 Encounters:   09/05/24 278 lb 4.8 oz (126.2 kg)  07/25/24 294 lb 12.8 oz (133.7 kg)  07/19/24 297 lb 13.5 oz (135.1 kg)     GEN: Well nourished, well developed in no acute distress HEENT: Normal NECK: No JVD; No carotid bruits LYMPHATICS: No lymphadenopathy CARDIAC: S1S2 noted,RRR, no murmurs, rubs, gallops RESPIRATORY:  Clear to auscultation without rales, wheezing or rhonchi  ABDOMEN: Soft, non-tender, non-distended, +bowel sounds, no guarding. EXTREMITIES: No edema, No cyanosis, no clubbing MUSCULOSKELETAL:  No deformity  SKIN: Warm and dry NEUROLOGIC:  Alert and oriented x 3, non-focal PSYCHIATRIC:  Normal affect, good insight  ASSESSMENT:    No diagnosis found.  PLAN:    Hypertension-blood pressure is elevated in office today will increase her hydralazine  to 50 mg 3 times daily.  She is not on amlodipine  here on her medication list but she tells me that at home she takes amlodipine  I want her to please send me that information she also me a picture of her medicine bottle of my chart.  Will continue other antihypertensives which includes carvedilol  25 mg twice daily, valsartan  300 mg daily, Aldactone  50 mg daily and she take torsemide  40 mg daily.   No chest pain.  We discussed her coronary CTA results show 910 Oxis study.   Hx of Cva - cont current medication regimen.   Remains a non-smoker.    The patient is in agreement with the above plan. The patient left the office in stable condition.  The patient will follow up in   Medication Adjustments/Labs and Tests Ordered: Current medicines are reviewed at length with the patient today.  Concerns regarding medicines are outlined above.  No orders of the defined types were placed in this encounter.  No orders of the defined types were placed in this encounter.   There are no Patient Instructions on file for this visit.   Adopting a Healthy Lifestyle.  Know what a healthy weight is for you (roughly BMI <25) and aim to maintain  this   Aim for 7+ servings of fruits and vegetables daily   65-80+ fluid ounces of water or unsweet tea for healthy kidneys   Limit to max 1 drink of alcohol per day; avoid smoking/tobacco   Limit animal fats in diet for cholesterol and heart health - choose grass fed whenever available   Avoid highly processed foods, and foods high in saturated/trans fats   Aim for low stress - take time to unwind and care for your mental health   Aim for 150 min of moderate intensity exercise weekly for heart health, and weights twice weekly for bone health   Aim for 7-9 hours of sleep daily   When it comes to diets, agreement about the perfect plan isnt easy to find, even among the experts. Experts at the [facility] [employer] developed an idea known as the Healthy Eating Plate. Just imagine a plate divided into logical, healthy portions.   The emphasis is on diet quality:   Load up on vegetables and fruits - one-half of your plate: Aim for color and variety, and remember that potatoes dont count.   Go for whole grains - one-quarter of your plate: Whole wheat, barley, wheat berries, quinoa, oats, brown rice, and foods made with them. If you want pasta, go with whole wheat pasta.   Protein power - one-quarter of your plate: Fish, chicken, beans, and nuts are all healthy, versatile protein sources. Limit red meat.   The diet, however, does go beyond the plate, offering a few other suggestions.  Use healthy plant oils, such as olive, canola, soy, corn, sunflower and peanut. Check the labels, and avoid partially hydrogenated oil, which have unhealthy trans fats.   If youre thirsty, drink water. Coffee and tea are good in moderation, but skip sugary drinks and limit milk and dairy products to one or two daily servings.   The type of carbohydrate in the diet is more important than the amount. Some sources of carbohydrates, such as vegetables, fruits, whole grains, and beans-are healthier  than others.   Finally, stay active  Signed, Dub Huntsman, DO  09/05/2024 8:27 AM    [facility] "

## 2024-10-01 ENCOUNTER — Telehealth: Payer: Self-pay

## 2024-10-01 ENCOUNTER — Other Ambulatory Visit (HOSPITAL_COMMUNITY): Payer: Self-pay

## 2024-10-01 ENCOUNTER — Ambulatory Visit: Admitting: Cardiology

## 2024-10-01 ENCOUNTER — Encounter (HOSPITAL_COMMUNITY): Payer: Self-pay

## 2024-10-01 ENCOUNTER — Encounter: Payer: Self-pay | Admitting: Cardiology

## 2024-10-01 ENCOUNTER — Other Ambulatory Visit: Payer: Self-pay

## 2024-10-01 VITALS — BP 150/94 | HR 94 | Ht 67.0 in | Wt 291.4 lb

## 2024-10-01 DIAGNOSIS — R600 Localized edema: Secondary | ICD-10-CM | POA: Diagnosis not present

## 2024-10-01 DIAGNOSIS — I5032 Chronic diastolic (congestive) heart failure: Secondary | ICD-10-CM | POA: Diagnosis not present

## 2024-10-01 DIAGNOSIS — I10 Essential (primary) hypertension: Secondary | ICD-10-CM | POA: Insufficient documentation

## 2024-10-01 DIAGNOSIS — I110 Hypertensive heart disease with heart failure: Secondary | ICD-10-CM | POA: Insufficient documentation

## 2024-10-01 DIAGNOSIS — R079 Chest pain, unspecified: Secondary | ICD-10-CM | POA: Diagnosis not present

## 2024-10-01 MED ORDER — POTASSIUM CHLORIDE CRYS ER 20 MEQ PO TBCR
20.0000 meq | EXTENDED_RELEASE_TABLET | Freq: Two times a day (BID) | ORAL | 3 refills | Status: DC
  Filled 2024-10-01: qty 180, 90d supply, fill #0

## 2024-10-01 MED ORDER — FUROSCIX 80 MG/10ML ~~LOC~~ CTKT
80.0000 mg | CARTRIDGE | Freq: Once | SUBCUTANEOUS | 2 refills | Status: DC
  Filled 2024-10-01: qty 1, 1d supply, fill #0

## 2024-10-01 MED ORDER — FUROSCIX 80 MG/10ML ~~LOC~~ CTKT
80.0000 mg | CARTRIDGE | Freq: Every day | SUBCUTANEOUS | 0 refills | Status: DC | PRN
  Filled 2024-10-01: qty 3, 3d supply, fill #0

## 2024-10-01 MED ORDER — TORSEMIDE 20 MG PO TABS
ORAL_TABLET | ORAL | 3 refills | Status: DC
  Filled 2024-10-01 (×2): qty 240, 84d supply, fill #0

## 2024-10-01 NOTE — Patient Instructions (Addendum)
 Medication Instructions:  Your physician has recommended you make the following change in your medication:  For 3 days: Furoscix   START: Potassium 20 mEq twice daily  On Saturday - START: Torsemide  40 mg twice daily (Tuesday, Thursday and Saturday)  START: Torsemide  40 mg once daily (Monday, Wednesday, Friday)  *If you need a refill on your cardiac medications before your next appointment, please call your pharmacy*  Lab Work: CMET, Mag If you have labs (blood work) drawn today and your tests are completely normal, you will receive your results only by: MyChart Message (if you have MyChart) OR A paper copy in the mail If you have any lab test that is abnormal or we need to change your treatment, we will call you to review the results.  Follow-Up: At [facility], you and your health needs are our priority.  As part of our continuing mission to provide you with exceptional heart care, our providers are all part of one team.  This team includes your primary Cardiologist (physician) and Advanced Practice Providers or APPs (Physician Assistants and Nurse Practitioners) who all work together to provide you with the care you need, when you need it.  Your next appointment:   Jan 21 at 9:00 am   Provider:   Kardie Tobb, DO     Other Instructions:

## 2024-10-01 NOTE — Telephone Encounter (Signed)
 Patient Advocate Encounter  Prior authorization for Furoscix  has been submitted and approved. Test billing returns $4 for 3 day supply.  Key: [id number] Effective: 10/01/2024 to 10/01/2025  Rachel DEL, CPhT Rx Patient Advocate Phone: [phone]

## 2024-10-01 NOTE — Progress Notes (Signed)
 Specialty Pharmacy Initial Fill Coordination Note  Carolyn Zimmerman is a 39 y.o. female contacted today regarding initial fill of specialty medication(s) Furosemide  (Furoscix )   Patient requested [name] at [facility] at [place] date: 10/01/24   Medication will be filled on: 10/01/24    Patient is aware of $4 copayment.

## 2024-10-01 NOTE — Progress Notes (Signed)
 " Cardiology Office Note:    Date:  10/01/2024   ID:  Carolyn Zimmerman, DOB 1986-09-13, MRN [id number]  PCP:  Carolyn Roxan BROCKS, NP  Cardiologist:  Dub Huntsman, DO  Electrophysiologist:  None   Referring MD: Carolyn Roxan BROCKS, NP    I have had so much go on since I saw you  History of Present Illness:    Carolyn Zimmerman is a 39 y.o. female with a hx of hx of uncontrolled hypertension, CVA in October 2023 acute left cerebral hemisphere compatible with watershed infarct, diabetes mellitus, heart failure with midrange ejection fraction, smoker, obesity, hyperlipidemia.   Since her last visit with me recently she tells me she has had some increasing leg edema.   Past Medical History:  Diagnosis Date   Abscess of chest wall 06/15/2017   Cellulitis    Coronavirus infection 05/02/2020   CVA (cerebral vascular accident) (HCC) 07/14/2022   L MCA CVA, watershed infarct   Diabetes mellitus    Heart failure with mildly reduced ejection fraction (HFmrEF) (HCC) 09/29/2021   EF 45%, moderate LVH   Hyperlipidemia associated with type 2 diabetes mellitus (HCC)    Hypertension    Jaundice    Obesity    TOA (tubo-ovarian abscess) 09/2019    Past Surgical History:  Procedure Laterality Date   CHOLECYSTECTOMY     IRRIGATION AND DEBRIDEMENT ABSCESS N/A 05/02/2020   Procedure: IRRIGATION AND DEBRIDEMENT ABDOMINAL and CHEST WALL NECROTIZING FASCITITS;  Surgeon: Sheldon Standing, MD;  Location: [facility];  Service: General;  Laterality: N/A;   liver stent      Current Medications: Current Meds  Medication Sig   acetaminophen  (TYLENOL ) 500 MG tablet Take 1,000 mg by mouth every 6 (six) hours as needed for mild pain (pain score 1-3) or moderate pain (pain score 4-6).   albuterol  (VENTOLIN  HFA) 108 (90 Base) MCG/ACT inhaler Inhale 2 puffs into the lungs every 4 (four) hours as needed for wheezing or shortness of breath.   aspirin  81 MG chewable tablet Chew 1 tablet (81 mg total) by mouth daily.   bismuth  subsalicylate (PEPTO BISMOL) 262 MG/15ML suspension Take 30 mLs by mouth every 6 (six) hours as needed for indigestion or diarrhea or loose stools.   carvedilol  (COREG ) 25 MG tablet Take 1 tablet (25 mg total) by mouth 2 (two) times daily.   Continuous Glucose Sensor (DEXCOM G7 SENSOR) MISC 1 Application by Does not apply route daily.   gabapentin  (NEURONTIN ) 300 MG capsule Take 1 capsule (300 mg total) by mouth 2 (two) times daily.   hydrALAZINE  (APRESOLINE ) 50 MG tablet Take 1 tablet (50 mg total) by mouth 3 (three) times daily.   hydrOXYzine  (ATARAX ) 10 MG tablet Take 1 tablet (10 mg total) by mouth as needed for anxiety.   icosapent Ethyl (VASCEPA) 1 g capsule Take 2 g by mouth 2 (two) times daily.   Insulin  Glargine Solostar (LANTUS ) 100 UNIT/ML Solostar Pen Inject 50 Units into the skin in the morning and at bedtime.   insulin  lispro (HUMALOG ) 100 UNIT/ML KwikPen Inject 20 Units into the skin 3 (three) times daily with meals.   loperamide  (IMODIUM ) 2 MG capsule Take 1 capsule (2 mg total) by mouth 4 (four) times daily as needed for diarrhea or loose stools.   nystatin  cream (MYCOSTATIN ) Apply to affected area 2 times daily   potassium chloride  SA (KLOR-CON  M20) 20 MEQ tablet Take 1 tablet (20 mEq total) by mouth 2 (two) times daily.   rosuvastatin  (CRESTOR ) 20  MG tablet Take 1 tablet (20 mg total) by mouth daily.   senna-docusate (SENOKOT-S) 8.6-50 MG tablet Take 1-2 tablets by mouth 2 (two) times daily between meals as needed for mild constipation or moderate constipation.   spironolactone  (ALDACTONE ) 50 MG tablet Take 1 tablet (50 mg total) by mouth daily.   torsemide  (DEMADEX ) 20 MG tablet Take 2 tablets (40mg ) by mouth twice daily on Tuesday, Thursday and Saturday and take 2 tablets (40mg ) once daily on Sunday, Monday, Wednesday, and Friday   valsartan  (DIOVAN ) 320 MG tablet Take 1 tablet (320 mg total) by mouth daily.   [DISCONTINUED] torsemide  (DEMADEX ) 20 MG tablet Take 2 tablets (40 mg  total) by mouth daily.     Allergies:   Prochlorperazine  and Contrast media [iodinated contrast media]   Social History   Socioeconomic History   Marital status: Single    Spouse name: Not on file   Number of children: Not on file   Years of education: Not on file   Highest education level: GED or equivalent  Occupational History   Not on file  Tobacco Use   Smoking status: Former    Current packs/day: 0.00    Types: Cigarettes    Quit date: 10/08/2023    Years since quitting: 0.9   Smokeless tobacco: Never  Vaping Use   Vaping status: Former  Substance and Sexual Activity   Alcohol use: Not Currently    Comment: occ   Drug use: No   Sexual activity: Not on file  Other Topics Concern   Not on file  Social History Narrative   Are you right handed or left handed? right   Are you currently employed ? yes   What is your current occupation?[employer]   Do you live at home alone?with kids   Who lives with you?    What type of home do you live in: 1 story or 2 story? two    Caffeine 1-3 a day   Social Drivers of Health   Tobacco Use: Medium Risk (10/01/2024)   Patient History    Smoking Tobacco Use: Former    Smokeless Tobacco Use: Never    Passive Exposure: Not on file  Financial Resource Strain: Medium Risk (05/11/2024)   Overall Financial Resource Strain (CARDIA)    Difficulty of Paying Living Expenses: Somewhat hard  Food Insecurity: No Food Insecurity (07/23/2024)   Epic    Worried About Programme Researcher, Broadcasting/film/video in the Last Year: Never true    Ran Out of Food in the Last Year: Never true  Transportation Needs: No Transportation Needs (07/23/2024)   Epic    Lack of Transportation (Medical): No    Lack of Transportation (Non-Medical): No  Physical Activity: Insufficiently Active (05/11/2024)   Exercise Vital Sign    Days of Exercise per Week: 1 day    Minutes of Exercise per Session: 10 min  Stress: Stress Concern Present (05/11/2024)   [employer] of Occupational Health  - Occupational Stress Questionnaire    Feeling of Stress: To some extent  Social Connections: Socially Isolated (05/11/2024)   Social Connection and Isolation Panel    Frequency of Communication with Friends and Family: Once a week    Frequency of Social Gatherings with Friends and Family: Once a week    Attends Religious Services: 1 to 4 times per year    Active Member of [employer] or Organizations: No    Attends Banker Meetings: Not on file    Marital Status:  Separated  Depression (PHQ2-9): Low Risk (07/25/2024)   Depression (PHQ2-9)    PHQ-2 Score: 0  Alcohol Screen: Low Risk (05/11/2024)   Alcohol Screen    Last Alcohol Screening Score (AUDIT): 0  Housing: Unknown (07/23/2024)   Epic    Unable to Pay for Housing in the Last Year: No    Number of Times Moved in the Last Year: Not on file    Homeless in the Last Year: No  Recent Concern: Housing - High Risk (05/11/2024)   Epic    Unable to Pay for Housing in the Last Year: Yes    Number of Times Moved in the Last Year: 1    Homeless in the Last Year: No  Utilities: Not At Risk (07/23/2024)   Epic    Threatened with loss of utilities: No  Health Literacy: Not on file     Family History: The patient's family history includes Diabetes in her father; Hypertension in her father and mother.  ROS:   Review of Systems  Constitution: Negative for decreased appetite, fever and weight gain.  HENT: Negative for congestion, ear discharge, hoarse voice and sore throat.   Eyes: Negative for discharge, redness, vision loss in right eye and visual halos.  Cardiovascular: Negative for chest pain, dyspnea on exertion, leg swelling, orthopnea and palpitations.  Respiratory: Negative for cough, hemoptysis, shortness of breath and snoring.   Endocrine: Negative for heat intolerance and polyphagia.  Hematologic/Lymphatic: Negative for bleeding problem. Does not bruise/bleed easily.  Skin: Negative for flushing, nail changes, rash and  suspicious lesions.  Musculoskeletal: Negative for arthritis, joint pain, muscle cramps, myalgias, neck pain and stiffness.  Gastrointestinal: Negative for abdominal pain, bowel incontinence, diarrhea and excessive appetite.  Genitourinary: Negative for decreased libido, genital sores and incomplete emptying.  Neurological: Negative for brief paralysis, focal weakness, headaches and loss of balance.  Psychiatric/Behavioral: Negative for altered mental status, depression and suicidal ideas.  Allergic/Immunologic: Negative for HIV exposure and persistent infections.    EKGs/Labs/Other Studies Reviewed:    The following studies were reviewed today:   EKG:  None today   Recent Labs: 07/15/2024: Pro Brain Natriuretic Peptide 1,386.0 07/16/2024: B Natriuretic Peptide 141.4; TSH 0.298 07/22/2024: Magnesium  1.8 07/25/2024: ALT 20; BUN 16; Creat 0.90; Hemoglobin 10.8; Platelets 421; Potassium 4.3; Sodium 130  Recent Lipid Panel    Component Value Date/Time   CHOL 178 10/29/2023 1148   CHOL 172 12/29/2021 0919   TRIG 417 (H) 10/29/2023 1148   HDL 40 (L) 10/29/2023 1148   HDL 34 (L) 12/29/2021 0919   CHOLHDL 4.5 10/29/2023 1148   VLDL 45 (H) 10/14/2023 0554   LDLCALC  10/29/2023 1148     Comment:     . LDL cholesterol not calculated. Triglyceride levels greater than 400 mg/dL invalidate calculated LDL results. . Reference range: <100 . Desirable range <100 mg/dL for primary prevention;   <70 mg/dL for patients with CHD or diabetic patients  with > or = 2 CHD risk factors. SABRA LDL-C is now calculated using the Martin-Hopkins  calculation, which is a validated novel method providing  better accuracy than the Friedewald equation in the  estimation of LDL-C.  Gladis APPLETHWAITE et al. SANDREA. 7986;689(80): 2061-2068  (http://education.[facility].com/faq/FAQ164)    LDLDIRECT 64.0 03/26/2023 1009    Physical Exam:    VS:  BP (!) 150/94 (BP Location: Left Arm, Patient Position: Sitting,  Cuff Size: Large)   Pulse 94   Ht 5' 7 (1.702 m)   Wt 291 lb  6.4 oz (132.2 kg)   SpO2 99%   BMI 45.64 kg/m     Wt Readings from Last 3 Encounters:  10/01/24 291 lb 6.4 oz (132.2 kg)  09/05/24 278 lb 4.8 oz (126.2 kg)  07/25/24 294 lb 12.8 oz (133.7 kg)     GEN: Well nourished, well developed in no acute distress HEENT: Normal NECK: No JVD; No carotid bruits LYMPHATICS: No lymphadenopathy CARDIAC: S1S2 noted,RRR, no murmurs, rubs, gallops RESPIRATORY:  Clear to auscultation without rales, wheezing or rhonchi  ABDOMEN: Soft, non-tender, non-distended, +bowel sounds, no guarding. EXTREMITIES: No edema, No cyanosis, no clubbing MUSCULOSKELETAL:  No deformity  SKIN: Warm and dry NEUROLOGIC:  Alert and oriented x 3, non-focal PSYCHIATRIC:  Normal affect, good insight  ASSESSMENT:    1. Lower extremity edema   2. Primary hypertension   3. Chronic diastolic heart failure (HCC)   4. Hypertensive heart disease with heart failure (HCC)   5. Chest pain of uncertain etiology      PLAN:    Hypertension-blood pressure is not at target in the office today.  Will continue her Coreg  25 mg twice daily, hydralazine  50 mg 3 times daily, valsartan  320 mg and Aldactone  50 mg daily. With the adjustment of her diuretics I will not be increasing her antihypertensive will reassess in a week  Chronic diastolic heart failure with increasing lower extremity edema-she is experiencing significant lower extremity edema a current dose of torsemide  40 mg daily is not helping with this.  To avoid hospital admission will be beneficial to use Furoscix  in this patient 10 mg/mL x 3 days.  Then she will start her torsemide  40 mg twice a day on Tuesday Thursdays and Saturdays, then 40 mg once daily on Monday Wednesday and Fridays with potassium supplements.  I will bring her back in 1 week for reassessment  Chest pain - She reports some chest discomfort which has improved.  Will continue to monitor the patient  closely should this continue to happen I will send her for cardiac catheterization given her coronary CTA was inconclusive.   Hx of Cva - cont current medication regimen.   Remains a non-smoker.  The patient understands the need to lose weight with diet and exercise. We have discussed specific strategies for this.   The patient is in agreement with the above plan. The patient left the office in stable condition.  The patient will follow up in   Medication Adjustments/Labs and Tests Ordered: Current medicines are reviewed at length with the patient today.  Concerns regarding medicines are outlined above.  Orders Placed This Encounter  Procedures   Comp Met (CMET)   Magnesium    Ambulatory Referral for Peripheral Vascular Consult   Meds ordered this encounter  Medications   potassium chloride  SA (KLOR-CON  M20) 20 MEQ tablet    Sig: Take 1 tablet (20 mEq total) by mouth 2 (two) times daily.    Dispense:  180 tablet    Refill:  3   torsemide  (DEMADEX ) 20 MG tablet    Sig: Take 2 tablets (40mg ) by mouth twice daily on Tuesday, Thursday and Saturday and take 2 tablets (40mg ) once daily on Sunday, Monday, Wednesday, and Friday    Dispense:  240 tablet    Refill:  3    also takes 40mg  daily on sunday per secure chat with Jasmine 10/01/24 ALB    Patient Instructions  Medication Instructions:  Your physician has recommended you make the following change in your medication:  For 3  days: Furoscix   START: Potassium 20 mEq twice daily  On Saturday - START: Torsemide  40 mg twice daily (Tuesday, Thursday and Saturday)  START: Torsemide  40 mg once daily (Monday, Wednesday, Friday)  *If you need a refill on your cardiac medications before your next appointment, please call your pharmacy*  Lab Work: CMET, Mag If you have labs (blood work) drawn today and your tests are completely normal, you will receive your results only by: MyChart Message (if you have MyChart) OR A paper copy in the  mail If you have any lab test that is abnormal or we need to change your treatment, we will call you to review the results.  Follow-Up: At [facility], you and your health needs are our priority.  As part of our continuing mission to provide you with exceptional heart care, our providers are all part of one team.  This team includes your primary Cardiologist (physician) and Advanced Practice Providers or APPs (Physician Assistants and Nurse Practitioners) who all work together to provide you with the care you need, when you need it.  Your next appointment:   Jan 21 at 9:00 am   Provider:   Maritza Hosterman, DO     Other Instructions:            Adopting a Healthy Lifestyle.  Know what a healthy weight is for you (roughly BMI <25) and aim to maintain this   Aim for 7+ servings of fruits and vegetables daily   65-80+ fluid ounces of water or unsweet tea for healthy kidneys   Limit to max 1 drink of alcohol per day; avoid smoking/tobacco   Limit animal fats in diet for cholesterol and heart health - choose grass fed whenever available   Avoid highly processed foods, and foods high in saturated/trans fats   Aim for low stress - take time to unwind and care for your mental health   Aim for 150 min of moderate intensity exercise weekly for heart health, and weights twice weekly for bone health   Aim for 7-9 hours of sleep daily   When it comes to diets, agreement about the perfect plan isnt easy to find, even among the experts. Experts at the [facility] of [employer] developed an idea known as the Healthy Eating Plate. Just imagine a plate divided into logical, healthy portions.   The emphasis is on diet quality:   Load up on vegetables and fruits - one-half of your plate: Aim for color and variety, and remember that potatoes dont count.   Go for whole grains - one-quarter of your plate: Whole wheat, barley, wheat berries, quinoa, oats, brown rice, and foods  made with them. If you want pasta, go with whole wheat pasta.   Protein power - one-quarter of your plate: Fish, chicken, beans, and nuts are all healthy, versatile protein sources. Limit red meat.   The diet, however, does go beyond the plate, offering a few other suggestions.   Use healthy plant oils, such as olive, canola, soy, corn, sunflower and peanut. Check the labels, and avoid partially hydrogenated oil, which have unhealthy trans fats.   If youre thirsty, drink water. Coffee and tea are good in moderation, but skip sugary drinks and limit milk and dairy products to one or two daily servings.   The type of carbohydrate in the diet is more important than the amount. Some sources of carbohydrates, such as vegetables, fruits, whole grains, and beans-are healthier than others.   Finally, stay  active  Signed, Mikaelyn Arthurs, DO  10/01/2024 10:54 AM    [facility] "

## 2024-10-01 NOTE — Addendum Note (Signed)
 Addended by: Alliana Mcauliff K on: 10/01/2024 01:46 PM   Modules accepted: Orders

## 2024-10-02 ENCOUNTER — Other Ambulatory Visit (HOSPITAL_COMMUNITY): Payer: Self-pay

## 2024-10-02 LAB — COMPREHENSIVE METABOLIC PANEL WITH GFR
ALT: 13 IU/L (ref 0–32)
AST: 13 IU/L (ref 0–40)
Albumin: 3.1 g/dL — ABNORMAL LOW (ref 3.9–4.9)
Alkaline Phosphatase: 104 IU/L (ref 41–116)
BUN/Creatinine Ratio: 8 — ABNORMAL LOW (ref 9–23)
BUN: 9 mg/dL (ref 6–20)
Bilirubin Total: 0.3 mg/dL (ref 0.0–1.2)
CO2: 20 mmol/L (ref 20–29)
Calcium: 9.3 mg/dL (ref 8.7–10.2)
Chloride: 96 mmol/L (ref 96–106)
Creatinine, Ser: 1.09 mg/dL — ABNORMAL HIGH (ref 0.57–1.00)
Globulin, Total: 3.4 g/dL (ref 1.5–4.5)
Glucose: 405 mg/dL — ABNORMAL HIGH (ref 70–99)
Potassium: 4.3 mmol/L (ref 3.5–5.2)
Sodium: 131 mmol/L — ABNORMAL LOW (ref 134–144)
Total Protein: 6.5 g/dL (ref 6.0–8.5)
eGFR: 68 mL/min/1.73

## 2024-10-02 LAB — MAGNESIUM: Magnesium: 1.8 mg/dL (ref 1.6–2.3)

## 2024-10-07 ENCOUNTER — Encounter: Payer: Self-pay | Admitting: Cardiovascular Disease

## 2024-10-07 ENCOUNTER — Ambulatory Visit: Admitting: Cardiovascular Disease

## 2024-10-07 VITALS — BP 182/92 | HR 84 | Ht 67.5 in | Wt 286.5 lb

## 2024-10-07 DIAGNOSIS — I5022 Chronic systolic (congestive) heart failure: Secondary | ICD-10-CM | POA: Insufficient documentation

## 2024-10-07 DIAGNOSIS — I10 Essential (primary) hypertension: Secondary | ICD-10-CM | POA: Diagnosis not present

## 2024-10-07 DIAGNOSIS — E785 Hyperlipidemia, unspecified: Secondary | ICD-10-CM | POA: Insufficient documentation

## 2024-10-07 DIAGNOSIS — I1A0 Resistant hypertension: Secondary | ICD-10-CM | POA: Insufficient documentation

## 2024-10-07 NOTE — Patient Instructions (Signed)
 Medication Instructions:  No changes *If you need a refill on your cardiac medications before your next appointment, please call your pharmacy*  Lab Work: None ordered If you have labs (blood work) drawn today and your tests are completely normal, you will receive your results only by: MyChart Message (if you have MyChart) OR A paper copy in the mail If you have any lab test that is abnormal or we need to change your treatment, we will call you to review the results.  Testing/Procedures: None ordered  Follow-Up: At [facility], you and your health needs are our priority.  As part of our continuing mission to provide you with exceptional heart care, our providers are all part of one team.  This team includes your primary Cardiologist (physician) and Advanced Practice Providers or APPs (Physician Assistants and Nurse Practitioners) who all work together to provide you with the care you need, when you need it.  Your next appointment:   We will contact you once we here if the Renal Denervation procedure has been approved.

## 2024-10-07 NOTE — Progress Notes (Signed)
 "    Cardiology Office Note   Date:  10/07/2024   ID:  Carolyn Zimmerman, DOB 1986-04-03, MRN [id number]  PCP:  Carolyn Roxan BROCKS, NP  Cardiologist:  Dr. Sheena  No chief complaint on file.     History of Present Illness: Carolyn Zimmerman is a 39 y.o. female who was referred by Dr. Sheena for evaluation of renal denervation for resistant hypertension. She has known history of uncontrolled hypertension, CVA in October 2023, diabetes mellitus, chronic systolic heart failure, tobacco use and hyperlipidemia. She reports prolonged history of hypertension that started in her 86s.  She had renal artery duplex in 2023 which showed no evidence of renal artery stenosis.  Sleep apnea testing was negative.  Her blood pressure has been difficult to control.  She is currently on 4 antihypertensive medications. She has been feeling sick lately with sore throat and generalized fatigue.   Past Medical History:  Diagnosis Date   Abscess of chest wall 06/15/2017   Cellulitis    Coronavirus infection 05/02/2020   CVA (cerebral vascular accident) (HCC) 07/14/2022   L MCA CVA, watershed infarct   Diabetes mellitus    Heart failure with mildly reduced ejection fraction (HFmrEF) (HCC) 09/29/2021   EF 45%, moderate LVH   Hyperlipidemia associated with type 2 diabetes mellitus (HCC)    Hypertension    Jaundice    Obesity    TOA (tubo-ovarian abscess) 09/2019    Past Surgical History:  Procedure Laterality Date   CHOLECYSTECTOMY     IRRIGATION AND DEBRIDEMENT ABSCESS N/A 05/02/2020   Procedure: IRRIGATION AND DEBRIDEMENT ABDOMINAL and CHEST WALL NECROTIZING FASCITITS;  Surgeon: Sheldon Standing, MD;  Location: [facility];  Service: General;  Laterality: N/A;   liver stent       Current Outpatient Medications  Medication Sig Dispense Refill   acetaminophen  (TYLENOL ) 500 MG tablet Take 1,000 mg by mouth every 6 (six) hours as needed for mild pain (pain score 1-3) or moderate pain (pain score 4-6).     albuterol   (VENTOLIN  HFA) 108 (90 Base) MCG/ACT inhaler Inhale 2 puffs into the lungs every 4 (four) hours as needed for wheezing or shortness of breath. 18 g 0   aspirin  81 MG chewable tablet Chew 1 tablet (81 mg total) by mouth daily. 90 tablet 1   bismuth subsalicylate (PEPTO BISMOL) 262 MG/15ML suspension Take 30 mLs by mouth every 6 (six) hours as needed for indigestion or diarrhea or loose stools.     carvedilol  (COREG ) 25 MG tablet Take 1 tablet (25 mg total) by mouth 2 (two) times daily. 180 tablet 0   Continuous Glucose Receiver (FREESTYLE LIBRE 3 READER) DEVI Use as directed for continuous glucose monitoring.     Continuous Glucose Sensor (DEXCOM G7 SENSOR) MISC 1 Application by Does not apply route daily. 5 each 5   Continuous Glucose Sensor (FREESTYLE LIBRE 3 PLUS SENSOR) MISC Replace every 15 days     gabapentin  (NEURONTIN ) 300 MG capsule Take 1 capsule (300 mg total) by mouth 2 (two) times daily. 180 capsule 0   hydrALAZINE  (APRESOLINE ) 50 MG tablet Take 1 tablet (50 mg total) by mouth 3 (three) times daily. 270 tablet 3   hydrOXYzine  (ATARAX ) 10 MG tablet Take 1 tablet (10 mg total) by mouth as needed for anxiety. 30 tablet 3   icosapent Ethyl (VASCEPA) 1 g capsule Take 2 g by mouth 2 (two) times daily.     Insulin  Glargine Solostar (LANTUS ) 100 UNIT/ML Solostar Pen Inject 50 Units into  the skin in the morning and at bedtime. 90 mL 3   insulin  lispro (HUMALOG ) 100 UNIT/ML KwikPen Inject 20 Units into the skin 3 (three) times daily with meals. 45 mL 3   loperamide  (IMODIUM ) 2 MG capsule Take 1 capsule (2 mg total) by mouth 4 (four) times daily as needed for diarrhea or loose stools. 12 capsule 0   nystatin  cream (MYCOSTATIN ) Apply to affected area 2 times daily 30 g 0   potassium chloride  SA (KLOR-CON  M20) 20 MEQ tablet Take 1 tablet (20 mEq total) by mouth 2 (two) times daily. 180 tablet 3   rosuvastatin  (CRESTOR ) 20 MG tablet Take 1 tablet (20 mg total) by mouth daily. 90 tablet 0    senna-docusate (SENOKOT-S) 8.6-50 MG tablet Take 1-2 tablets by mouth 2 (two) times daily between meals as needed for mild constipation or moderate constipation.     spironolactone  (ALDACTONE ) 50 MG tablet Take 1 tablet (50 mg total) by mouth daily. 90 tablet 0   torsemide  (DEMADEX ) 20 MG tablet Take 2 tablets (40mg ) by mouth twice daily on Tuesday, Thursday and Saturday and take 2 tablets (40mg ) once daily on Sunday, Monday, Wednesday, and Friday 240 tablet 3   valsartan  (DIOVAN ) 320 MG tablet Take 1 tablet (320 mg total) by mouth daily. 90 tablet 0   Furosemide  (FUROSCIX ) 80 MG/10ML CTKT Inject 80 mg into the skin daily as needed. (Patient not taking: Reported on 10/07/2024) 3 each 0   No current facility-administered medications for this visit.    Allergies:   Prochlorperazine  and Contrast media [iodinated contrast media]    Social History:  The patient  reports that she quit smoking about a year ago. Her smoking use included cigarettes. She has never used smokeless tobacco. She reports that she does not currently use alcohol. She reports that she does not use drugs.   Family History:  The patient's family history includes Diabetes in her father; Hypertension in her father and mother.    ROS:  Please see the history of present illness.   Otherwise, review of systems are positive for none.   All other systems are reviewed and negative.    PHYSICAL EXAM: VS:  BP (!) 182/92 (BP Location: Left Arm, Patient Position: Sitting, Cuff Size: Large)   Pulse 84   Ht 5' 7.5 (1.715 m)   Wt 286 lb 8 oz (130 kg)   SpO2 96%   BMI 44.21 kg/m  , BMI Body mass index is 44.21 kg/m. GEN: Well nourished, well developed, in no acute distress  HEENT: normal  Neck: no JVD, carotid bruits, or masses Cardiac: RRR; no murmurs, rubs, or gallops,no edema  Respiratory:  clear to auscultation bilaterally, normal work of breathing GI: soft, nontender, nondistended, + BS MS: no deformity or atrophy  Skin: warm  and dry, no rash Neuro:  Strength and sensation are intact Psych: euthymic mood, full affect   EKG:  EKG is not ordered today.    Recent Labs: 07/15/2024: Pro Brain Natriuretic Peptide 1,386.0 07/16/2024: B Natriuretic Peptide 141.4; TSH 0.298 07/25/2024: Hemoglobin 10.8; Platelets 421 10/01/2024: ALT 13; BUN 9; Creatinine, Ser 1.09; Magnesium  1.8; Potassium 4.3; Sodium 131    Lipid Panel    Component Value Date/Time   CHOL 178 10/29/2023 1148   CHOL 172 12/29/2021 0919   TRIG 417 (H) 10/29/2023 1148   HDL 40 (L) 10/29/2023 1148   HDL 34 (L) 12/29/2021 0919   CHOLHDL 4.5 10/29/2023 1148   VLDL 45 (H) 10/14/2023 9445  LDLCALC  10/29/2023 1148     Comment:     . LDL cholesterol not calculated. Triglyceride levels greater than 400 mg/dL invalidate calculated LDL results. . Reference range: <100 . Desirable range <100 mg/dL for primary prevention;   <70 mg/dL for patients with CHD or diabetic patients  with > or = 2 CHD risk factors. SABRA LDL-C is now calculated using the Martin-Hopkins  calculation, which is a validated novel method providing  better accuracy than the Friedewald equation in the  estimation of LDL-C.  Gladis APPLETHWAITE et al. SANDREA. 7986;689(80): 2061-2068  (http://education.[employer].com/faq/FAQ164)    LDLDIRECT 64.0 03/26/2023 1009      Wt Readings from Last 3 Encounters:  10/07/24 286 lb 8 oz (130 kg)  10/01/24 291 lb 6.4 oz (132.2 kg)  09/05/24 278 lb 4.8 oz (126.2 kg)          10/06/2024   11:10 PM  PAD Screen  Previous PAD dx? No   Previous surgical procedure? No   Pain with walking? Yes   Subsides with rest? No   Feet/toe relief with dangling? Yes   Painful, non-healing ulcers? No   Extremities discolored? No      Manually entered by patient      ASSESSMENT AND PLAN:  1.  Resistant hypertension: Blood pressure today is 182/92 and spite of 4 antihypertensive medications.  This has been associated with other comorbidities  including chronic systolic heart failure which is likely due to hypertensive heart disease.  Negative workup for secondary hypertension.  I do think she benefits from renal denervation to try to improve her blood pressure.  I discussed the procedure in details as well as risk and benefits and she is agreeable to proceed.  2.  Chronic systolic heart failure: Currently on carvedilol , spironolactone  and valsartan .  3.  Hyperlipidemia: Continue rosuvastatin .  She does have uncontrolled diabetes.    Disposition:   FU after renal denervation.  Signed,  Deatrice Cage, MD  10/07/2024 9:18 AM    [facility] "

## 2024-10-08 ENCOUNTER — Other Ambulatory Visit: Payer: Self-pay

## 2024-10-08 ENCOUNTER — Inpatient Hospital Stay (HOSPITAL_COMMUNITY)
Admission: EM | Admit: 2024-10-08 | Discharge: 2024-10-11 | DRG: 321 | Disposition: A | Attending: Internal Medicine | Admitting: Internal Medicine

## 2024-10-08 ENCOUNTER — Emergency Department (HOSPITAL_COMMUNITY)

## 2024-10-08 ENCOUNTER — Other Ambulatory Visit (HOSPITAL_COMMUNITY): Payer: Self-pay

## 2024-10-08 ENCOUNTER — Encounter: Payer: Self-pay | Admitting: Cardiology

## 2024-10-08 ENCOUNTER — Inpatient Hospital Stay (HOSPITAL_COMMUNITY)

## 2024-10-08 ENCOUNTER — Ambulatory Visit: Admitting: Cardiology

## 2024-10-08 ENCOUNTER — Encounter (HOSPITAL_COMMUNITY): Payer: Self-pay

## 2024-10-08 VITALS — BP 168/90 | HR 87 | Ht 67.0 in

## 2024-10-08 DIAGNOSIS — E1165 Type 2 diabetes mellitus with hyperglycemia: Secondary | ICD-10-CM | POA: Diagnosis present

## 2024-10-08 DIAGNOSIS — T383X6A Underdosing of insulin and oral hypoglycemic [antidiabetic] drugs, initial encounter: Secondary | ICD-10-CM | POA: Diagnosis present

## 2024-10-08 DIAGNOSIS — I5022 Chronic systolic (congestive) heart failure: Secondary | ICD-10-CM

## 2024-10-08 DIAGNOSIS — Z91128 Patient's intentional underdosing of medication regimen for other reason: Secondary | ICD-10-CM

## 2024-10-08 DIAGNOSIS — R55 Syncope and collapse: Secondary | ICD-10-CM

## 2024-10-08 DIAGNOSIS — Z833 Family history of diabetes mellitus: Secondary | ICD-10-CM

## 2024-10-08 DIAGNOSIS — I2489 Other forms of acute ischemic heart disease: Secondary | ICD-10-CM | POA: Diagnosis present

## 2024-10-08 DIAGNOSIS — Z8673 Personal history of transient ischemic attack (TIA), and cerebral infarction without residual deficits: Secondary | ICD-10-CM | POA: Insufficient documentation

## 2024-10-08 DIAGNOSIS — Z955 Presence of coronary angioplasty implant and graft: Secondary | ICD-10-CM

## 2024-10-08 DIAGNOSIS — Z79899 Other long term (current) drug therapy: Secondary | ICD-10-CM

## 2024-10-08 DIAGNOSIS — E1169 Type 2 diabetes mellitus with other specified complication: Secondary | ICD-10-CM | POA: Diagnosis present

## 2024-10-08 DIAGNOSIS — Z6841 Body Mass Index (BMI) 40.0 and over, adult: Secondary | ICD-10-CM

## 2024-10-08 DIAGNOSIS — Z794 Long term (current) use of insulin: Secondary | ICD-10-CM

## 2024-10-08 DIAGNOSIS — I5023 Acute on chronic systolic (congestive) heart failure: Secondary | ICD-10-CM | POA: Diagnosis present

## 2024-10-08 DIAGNOSIS — E8809 Other disorders of plasma-protein metabolism, not elsewhere classified: Secondary | ICD-10-CM | POA: Diagnosis present

## 2024-10-08 DIAGNOSIS — Z1152 Encounter for screening for COVID-19: Secondary | ICD-10-CM

## 2024-10-08 DIAGNOSIS — R42 Dizziness and giddiness: Secondary | ICD-10-CM | POA: Insufficient documentation

## 2024-10-08 DIAGNOSIS — Z87891 Personal history of nicotine dependence: Secondary | ICD-10-CM

## 2024-10-08 DIAGNOSIS — E871 Hypo-osmolality and hyponatremia: Secondary | ICD-10-CM | POA: Diagnosis present

## 2024-10-08 DIAGNOSIS — I3139 Other pericardial effusion (noninflammatory): Secondary | ICD-10-CM | POA: Diagnosis present

## 2024-10-08 DIAGNOSIS — E1140 Type 2 diabetes mellitus with diabetic neuropathy, unspecified: Secondary | ICD-10-CM | POA: Diagnosis present

## 2024-10-08 DIAGNOSIS — Z91148 Patient's other noncompliance with medication regimen for other reason: Secondary | ICD-10-CM

## 2024-10-08 DIAGNOSIS — Z91041 Radiographic dye allergy status: Secondary | ICD-10-CM

## 2024-10-08 DIAGNOSIS — I2510 Atherosclerotic heart disease of native coronary artery without angina pectoris: Secondary | ICD-10-CM | POA: Diagnosis present

## 2024-10-08 DIAGNOSIS — Z8249 Family history of ischemic heart disease and other diseases of the circulatory system: Secondary | ICD-10-CM

## 2024-10-08 DIAGNOSIS — R0602 Shortness of breath: Secondary | ICD-10-CM | POA: Insufficient documentation

## 2024-10-08 DIAGNOSIS — R739 Hyperglycemia, unspecified: Principal | ICD-10-CM

## 2024-10-08 DIAGNOSIS — E878 Other disorders of electrolyte and fluid balance, not elsewhere classified: Secondary | ICD-10-CM | POA: Diagnosis present

## 2024-10-08 DIAGNOSIS — I110 Hypertensive heart disease with heart failure: Secondary | ICD-10-CM

## 2024-10-08 DIAGNOSIS — Z7982 Long term (current) use of aspirin: Secondary | ICD-10-CM

## 2024-10-08 DIAGNOSIS — Z59868 Other specified financial insecurity: Secondary | ICD-10-CM

## 2024-10-08 DIAGNOSIS — E7849 Other hyperlipidemia: Secondary | ICD-10-CM | POA: Diagnosis present

## 2024-10-08 DIAGNOSIS — J029 Acute pharyngitis, unspecified: Secondary | ICD-10-CM | POA: Diagnosis present

## 2024-10-08 DIAGNOSIS — I1A0 Resistant hypertension: Secondary | ICD-10-CM | POA: Diagnosis present

## 2024-10-08 DIAGNOSIS — I5031 Acute diastolic (congestive) heart failure: Secondary | ICD-10-CM

## 2024-10-08 DIAGNOSIS — E876 Hypokalemia: Secondary | ICD-10-CM | POA: Diagnosis present

## 2024-10-08 LAB — CBC WITH DIFFERENTIAL/PLATELET
Abs Immature Granulocytes: 0.04 K/uL (ref 0.00–0.07)
Basophils Absolute: 0 K/uL (ref 0.0–0.1)
Basophils Relative: 0 %
Eosinophils Absolute: 0.1 K/uL (ref 0.0–0.5)
Eosinophils Relative: 2 %
HCT: 35.7 % — ABNORMAL LOW (ref 36.0–46.0)
Hemoglobin: 11.9 g/dL — ABNORMAL LOW (ref 12.0–15.0)
Immature Granulocytes: 1 %
Lymphocytes Relative: 36 %
Lymphs Abs: 2.7 K/uL (ref 0.7–4.0)
MCH: 27.4 pg (ref 26.0–34.0)
MCHC: 33.3 g/dL (ref 30.0–36.0)
MCV: 82.3 fL (ref 80.0–100.0)
Monocytes Absolute: 0.5 K/uL (ref 0.1–1.0)
Monocytes Relative: 6 %
Neutro Abs: 4.2 K/uL (ref 1.7–7.7)
Neutrophils Relative %: 55 %
Platelets: 315 K/uL (ref 150–400)
RBC: 4.34 MIL/uL (ref 3.87–5.11)
RDW: 12.8 % (ref 11.5–15.5)
WBC: 7.5 K/uL (ref 4.0–10.5)
nRBC: 0 % (ref 0.0–0.2)

## 2024-10-08 LAB — COMPREHENSIVE METABOLIC PANEL WITH GFR
ALT: 19 U/L (ref 0–44)
AST: 27 U/L (ref 15–41)
Albumin: 3 g/dL — ABNORMAL LOW (ref 3.5–5.0)
Alkaline Phosphatase: 135 U/L — ABNORMAL HIGH (ref 38–126)
Anion gap: 11 (ref 5–15)
BUN: 13 mg/dL (ref 6–20)
CO2: 25 mmol/L (ref 22–32)
Calcium: 8.3 mg/dL — ABNORMAL LOW (ref 8.9–10.3)
Chloride: 95 mmol/L — ABNORMAL LOW (ref 98–111)
Creatinine, Ser: 0.78 mg/dL (ref 0.44–1.00)
GFR, Estimated: >60 mL/min
Glucose, Bld: 474 mg/dL — ABNORMAL HIGH (ref 70–99)
Potassium: 3.7 mmol/L (ref 3.5–5.1)
Sodium: 131 mmol/L — ABNORMAL LOW (ref 135–145)
Total Bilirubin: 0.2 mg/dL (ref 0.0–1.2)
Total Protein: 7.1 g/dL (ref 6.5–8.1)

## 2024-10-08 LAB — URINALYSIS, ROUTINE W REFLEX MICROSCOPIC
Bacteria, UA: NONE SEEN
Bilirubin Urine: NEGATIVE
Glucose, UA: >=500 mg/dL — AB
Ketones, ur: NEGATIVE mg/dL
Leukocytes,Ua: NEGATIVE
Nitrite: NEGATIVE
Protein, ur: 100 mg/dL — AB
Specific Gravity, Urine: 1.011 (ref 1.005–1.030)
pH: 6 (ref 5.0–8.0)

## 2024-10-08 LAB — CBC
HCT: 36.1 % (ref 36.0–46.0)
Hemoglobin: 11.8 g/dL — ABNORMAL LOW (ref 12.0–15.0)
MCH: 27.2 pg (ref 26.0–34.0)
MCHC: 32.7 g/dL (ref 30.0–36.0)
MCV: 83.2 fL (ref 80.0–100.0)
Platelets: 337 K/uL (ref 150–400)
RBC: 4.34 MIL/uL (ref 3.87–5.11)
RDW: 13 % (ref 11.5–15.5)
WBC: 7 K/uL (ref 4.0–10.5)
nRBC: 0 % (ref 0.0–0.2)

## 2024-10-08 LAB — CBG MONITORING, ED
Glucose-Capillary: 578 mg/dL (ref 70–99)
Glucose-Capillary: 374 mg/dL — ABNORMAL HIGH (ref 70–99)

## 2024-10-08 LAB — D-DIMER, QUANTITATIVE: D-Dimer, Quant: 1.24 ug{FEU}/mL — ABNORMAL HIGH (ref 0.00–0.50)

## 2024-10-08 LAB — I-STAT VENOUS BLOOD GAS, ED
Acid-Base Excess: 2 mmol/L (ref 0.0–2.0)
Bicarbonate: 26.5 mmol/L (ref 20.0–28.0)
Calcium, Ion: 1.03 mmol/L — ABNORMAL LOW (ref 1.15–1.40)
HCT: 36 % (ref 36.0–46.0)
Hemoglobin: 12.2 g/dL (ref 12.0–15.0)
O2 Saturation: 78 %
Potassium: 3.4 mmol/L — ABNORMAL LOW (ref 3.5–5.1)
Sodium: 133 mmol/L — ABNORMAL LOW (ref 135–145)
TCO2: 28 mmol/L (ref 22–32)
pCO2, Ven: 42.2 mmHg — ABNORMAL LOW (ref 44–60)
pH, Ven: 7.406 (ref 7.25–7.43)
pO2, Ven: 42 mmHg (ref 32–45)

## 2024-10-08 LAB — I-STAT CHEM 8, ED
BUN: 14 mg/dL (ref 6–20)
Calcium, Ion: 1.06 mmol/L — ABNORMAL LOW (ref 1.15–1.40)
Chloride: 97 mmol/L — ABNORMAL LOW (ref 98–111)
Creatinine, Ser: 0.8 mg/dL (ref 0.44–1.00)
Glucose, Bld: 473 mg/dL — ABNORMAL HIGH (ref 70–99)
HCT: 37 % (ref 36.0–46.0)
Hemoglobin: 12.6 g/dL (ref 12.0–15.0)
Potassium: 3.4 mmol/L — ABNORMAL LOW (ref 3.5–5.1)
Sodium: 133 mmol/L — ABNORMAL LOW (ref 135–145)
TCO2: 24 mmol/L (ref 22–32)

## 2024-10-08 LAB — TYPE AND SCREEN
ABO/RH(D): B POS
Antibody Screen: NEGATIVE

## 2024-10-08 LAB — GROUP A STREP BY PCR: Group A Strep by PCR: NOT DETECTED

## 2024-10-08 LAB — T4, FREE: Free T4: 1.25 ng/dL (ref 0.80–2.00)

## 2024-10-08 LAB — RESP PANEL BY RT-PCR (RSV, FLU A&B, COVID)  RVPGX2
Influenza A by PCR: NEGATIVE
Influenza B by PCR: NEGATIVE
Resp Syncytial Virus by PCR: NEGATIVE
SARS Coronavirus 2 by RT PCR: NEGATIVE

## 2024-10-08 LAB — TROPONIN T, HIGH SENSITIVITY
Troponin T High Sensitivity: 27 ng/L — ABNORMAL HIGH (ref 0–19)
Troponin T High Sensitivity: 26 ng/L — ABNORMAL HIGH (ref 0–19)

## 2024-10-08 LAB — LIPASE, BLOOD: Lipase: 60 U/L — ABNORMAL HIGH (ref 11–51)

## 2024-10-08 LAB — MAGNESIUM: Magnesium: 1.8 mg/dL (ref 1.7–2.4)

## 2024-10-08 LAB — HCG, SERUM, QUALITATIVE: Preg, Serum: NEGATIVE

## 2024-10-08 LAB — TSH: TSH: 0.372 u[IU]/mL (ref 0.350–4.500)

## 2024-10-08 LAB — BETA-HYDROXYBUTYRIC ACID: Beta-Hydroxybutyric Acid: 0.12 mmol/L (ref 0.05–0.27)

## 2024-10-08 LAB — PRO BRAIN NATRIURETIC PEPTIDE: Pro Brain Natriuretic Peptide: 783 pg/mL — ABNORMAL HIGH

## 2024-10-08 MED ORDER — INSULIN ASPART 100 UNIT/ML IJ SOLN
10.0000 [IU] | Freq: Once | INTRAMUSCULAR | Status: AC
  Administered 2024-10-08: 10 [IU] via SUBCUTANEOUS
  Filled 2024-10-08: qty 10

## 2024-10-08 MED ORDER — IRBESARTAN 150 MG PO TABS
300.0000 mg | ORAL_TABLET | Freq: Every day | ORAL | Status: DC
  Administered 2024-10-09 – 2024-10-11 (×3): 300 mg via ORAL
  Filled 2024-10-08: qty 2
  Filled 2024-10-08: qty 1
  Filled 2024-10-08: qty 2

## 2024-10-08 MED ORDER — FUROSCIX 80 MG/10ML ~~LOC~~ CTKT
80.0000 mg | CARTRIDGE | Freq: Every day | SUBCUTANEOUS | 0 refills | Status: DC | PRN
  Filled 2024-10-08: qty 5, 5d supply, fill #0

## 2024-10-08 MED ORDER — FUROSEMIDE 10 MG/ML IJ SOLN: 40.0000 mg | Freq: Two times a day (BID) | INTRAMUSCULAR | Status: DC

## 2024-10-08 MED ORDER — HYDRALAZINE HCL 20 MG/ML IJ SOLN: 10.0000 mg | Freq: Four times a day (QID) | INTRAMUSCULAR | Status: AC | PRN

## 2024-10-08 MED ORDER — ROSUVASTATIN CALCIUM 20 MG PO TABS
20.0000 mg | ORAL_TABLET | Freq: Every day | ORAL | Status: DC
  Administered 2024-10-09 – 2024-10-11 (×3): 20 mg via ORAL
  Filled 2024-10-08 (×3): qty 1

## 2024-10-08 MED ORDER — FUROSEMIDE 10 MG/ML IJ SOLN
40.0000 mg | Freq: Once | INTRAMUSCULAR | Status: AC
  Administered 2024-10-08: 40 mg via INTRAVENOUS
  Filled 2024-10-08: qty 4

## 2024-10-08 MED ORDER — INSULIN ASPART 100 UNIT/ML IJ SOLN
0.0000 [IU] | Freq: Three times a day (TID) | INTRAMUSCULAR | Status: DC
  Administered 2024-10-09: 11 [IU] via SUBCUTANEOUS
  Filled 2024-10-08: qty 11

## 2024-10-08 MED ORDER — SODIUM CHLORIDE 0.9% FLUSH: 3.0000 mL | INTRAVENOUS | Status: DC | PRN

## 2024-10-08 MED ORDER — SODIUM CHLORIDE 0.9 % IV SOLN: 250.0000 mL | INTRAVENOUS | Status: AC | PRN

## 2024-10-08 MED ORDER — HEPARIN SODIUM (PORCINE) 5000 UNIT/ML IJ SOLN
5000.0000 [IU] | Freq: Three times a day (TID) | INTRAMUSCULAR | Status: DC
  Administered 2024-10-08 – 2024-10-10 (×4): 5000 [IU] via SUBCUTANEOUS
  Filled 2024-10-08 (×7): qty 1

## 2024-10-08 MED ORDER — LORAZEPAM 2 MG/ML IJ SOLN
0.5000 mg | Freq: Once | INTRAMUSCULAR | Status: AC
  Administered 2024-10-08: 0.5 mg via INTRAVENOUS
  Filled 2024-10-08: qty 1

## 2024-10-08 MED ORDER — FUROSEMIDE 10 MG/ML IJ SOLN
80.0000 mg | Freq: Two times a day (BID) | INTRAMUSCULAR | Status: DC
  Administered 2024-10-09 – 2024-10-10 (×5): 80 mg via INTRAVENOUS
  Filled 2024-10-08 (×5): qty 8

## 2024-10-08 MED ORDER — ASPIRIN 81 MG PO CHEW
81.0000 mg | CHEWABLE_TABLET | Freq: Every day | ORAL | Status: DC
  Administered 2024-10-09 – 2024-10-10 (×2): 81 mg via ORAL
  Filled 2024-10-08: qty 1

## 2024-10-08 MED ORDER — ACETAMINOPHEN 325 MG PO TABS
650.0000 mg | ORAL_TABLET | Freq: Four times a day (QID) | ORAL | Status: DC | PRN
  Administered 2024-10-09 – 2024-10-10 (×3): 650 mg via ORAL
  Filled 2024-10-08 (×3): qty 2

## 2024-10-08 MED ORDER — SODIUM CHLORIDE 0.9% FLUSH
3.0000 mL | Freq: Two times a day (BID) | INTRAVENOUS | Status: DC
  Administered 2024-10-09 – 2024-10-10 (×3): 3 mL via INTRAVENOUS

## 2024-10-08 MED ORDER — POTASSIUM CHLORIDE CRYS ER 20 MEQ PO TBCR
40.0000 meq | EXTENDED_RELEASE_TABLET | Freq: Once | ORAL | Status: AC
  Administered 2024-10-09: 40 meq via ORAL
  Filled 2024-10-08: qty 2

## 2024-10-08 MED ORDER — ACETAMINOPHEN 650 MG RE SUPP: 650.0000 mg | Freq: Four times a day (QID) | RECTAL | Status: DC | PRN

## 2024-10-08 MED ORDER — NICOTINE 14 MG/24HR TD PT24
14.0000 mg | MEDICATED_PATCH | Freq: Every day | TRANSDERMAL | Status: DC
  Filled 2024-10-08 (×4): qty 1

## 2024-10-08 MED ORDER — POTASSIUM CHLORIDE CRYS ER 20 MEQ PO TBCR
20.0000 meq | EXTENDED_RELEASE_TABLET | Freq: Once | ORAL | Status: AC
  Administered 2024-10-08: 20 meq via ORAL
  Filled 2024-10-08: qty 1

## 2024-10-08 MED ORDER — INSULIN ASPART 100 UNIT/ML IJ SOLN
0.0000 [IU] | Freq: Every day | INTRAMUSCULAR | Status: DC
  Administered 2024-10-09: 3 [IU] via SUBCUTANEOUS
  Filled 2024-10-08: qty 3

## 2024-10-08 MED ORDER — SPIRONOLACTONE 25 MG PO TABS
50.0000 mg | ORAL_TABLET | Freq: Every day | ORAL | Status: AC
  Administered 2024-10-09 – 2024-10-11 (×3): 50 mg via ORAL
  Filled 2024-10-08 (×3): qty 2

## 2024-10-08 MED ORDER — SODIUM CHLORIDE 0.9% FLUSH
3.0000 mL | Freq: Two times a day (BID) | INTRAVENOUS | Status: DC
  Administered 2024-10-09 – 2024-10-11 (×5): 3 mL via INTRAVENOUS

## 2024-10-08 NOTE — ED Triage Notes (Signed)
 Pt was at [facility] and had a near syncope episode. Pt was hyperglycemic for ems  Cbg 532. Pt reports that she has not been taking her blood sugar medications. Pt takes insulin  baseline. Pt reports she has been feeling unwell and that is why she has not been taking it.  Last bp 170/102 Hr 102  Spo2 99%

## 2024-10-08 NOTE — Patient Instructions (Signed)
 Medication Instructions:  Please use Furoscix  as directed for 5 days. Continue all other medications as listed.  *If you need a refill on your cardiac medications before your next appointment, please call your pharmacy*  Lab Work: Please have blood work today (CMP, MG, BNP) If you have labs (blood work) drawn today and your tests are completely normal, you will receive your results only by: MyChart Message (if you have MyChart) OR A paper copy in the mail If you have any lab test that is abnormal or we need to change your treatment, we will call you to review the results.   Follow-Up: At [facility], you and your health needs are our priority.  As part of our continuing mission to provide you with exceptional heart care, our providers are all part of one team.  This team includes your primary Cardiologist (physician) and Advanced Practice Providers or APPs (Physician Assistants and Nurse Practitioners) who all work together to provide you with the care you need, when you need it.  Your next appointment:   10/21/24 at 9:40 am.  Provider:   Kardie Tobb, DO    We recommend signing up for the patient portal called MyChart.  Sign up information is provided on this After Visit Summary.  MyChart is used to connect with patients for Virtual Visits (Telemedicine).  Patients are able to view lab/test results, encounter notes, upcoming appointments, etc.  Non-urgent messages can be sent to your provider as well.   To learn more about what you can do with MyChart, go to [email or web address].

## 2024-10-08 NOTE — Progress Notes (Addendum)
 " Cardiology Office Note:    Date:  10/08/2024   ID:  Carolyn Zimmerman, DOB [birth year], MRN [id number]  PCP:  Leonarda Roxan BROCKS, NP  Cardiologist:  Dub Huntsman, DO  Electrophysiologist:  None   Referring MD: Leonarda Roxan BROCKS, NP    I have had so much go on since I saw you  History of Present Illness:    Carolyn Zimmerman is a 39 y.o. female with a hx of hx of uncontrolled hypertension, CVA in October 2023 acute left cerebral hemisphere compatible with watershed infarct, diabetes mellitus, heart failure with midrange ejection fraction, smoker, obesity, hyperlipidemia.   At her last visit she had significant swelling so to avoid hospitalization I placed a patient on Furoscix  for 3 days and adjusted her home torsemide .  She is here today for follow-up visit.  She tells me that she has been feeling significantly lightheaded and dizzy.  She has had some weight loss visibly and improving swelling from her Furoscix .     She cannot really explain to me the details of the symptoms but she looks ill in the office.  She feels lightheaded as well.  In office she almost passes out as she was leaving the office.  Prior to this I discussed with the patient to use a couple days of Furoscix  for hospitalization.  But with this change it is important that she get seen in the emergency department given her history of CVA as well.    Past Medical History:  Diagnosis Date   Abscess of chest wall 06/15/2017   Cellulitis    Coronavirus infection 05/02/2020   CVA (cerebral vascular accident) (HCC) 07/14/2022   L MCA CVA, watershed infarct   Diabetes mellitus    Heart failure with mildly reduced ejection fraction (HFmrEF) (HCC) 09/29/2021   EF 45%, moderate LVH   Hyperlipidemia associated with type 2 diabetes mellitus (HCC)    Hypertension    Jaundice    Obesity    TOA (tubo-ovarian abscess) 09/2019    Past Surgical History:  Procedure Laterality Date   CHOLECYSTECTOMY     IRRIGATION AND DEBRIDEMENT ABSCESS  N/A 05/02/2020   Procedure: IRRIGATION AND DEBRIDEMENT ABDOMINAL and CHEST WALL NECROTIZING FASCITITS;  Surgeon: Sheldon Standing, MD;  Location: [facility];  Service: General;  Laterality: N/A;   liver stent      Current Medications: Current Meds  Medication Sig   acetaminophen  (TYLENOL ) 500 MG tablet Take 1,000 mg by mouth every 6 (six) hours as needed for mild pain (pain score 1-3) or moderate pain (pain score 4-6).   albuterol  (VENTOLIN  HFA) 108 (90 Base) MCG/ACT inhaler Inhale 2 puffs into the lungs every 4 (four) hours as needed for wheezing or shortness of breath.   aspirin  81 MG chewable tablet Chew 1 tablet (81 mg total) by mouth daily.   bismuth subsalicylate (PEPTO BISMOL) 262 MG/15ML suspension Take 30 mLs by mouth every 6 (six) hours as needed for indigestion or diarrhea or loose stools.   carvedilol  (COREG ) 25 MG tablet Take 1 tablet (25 mg total) by mouth 2 (two) times daily.   Continuous Glucose Receiver (FREESTYLE LIBRE 3 READER) DEVI Use as directed for continuous glucose monitoring.   Continuous Glucose Sensor (DEXCOM G7 SENSOR) MISC 1 Application by Does not apply route daily.   Continuous Glucose Sensor (FREESTYLE LIBRE 3 PLUS SENSOR) MISC Replace every 15 days   gabapentin  (NEURONTIN ) 300 MG capsule Take 1 capsule (300 mg total) by mouth 2 (two) times daily.   hydrALAZINE  (  APRESOLINE ) 50 MG tablet Take 1 tablet (50 mg total) by mouth 3 (three) times daily.   hydrOXYzine  (ATARAX ) 10 MG tablet Take 1 tablet (10 mg total) by mouth as needed for anxiety.   icosapent Ethyl (VASCEPA) 1 g capsule Take 2 g by mouth 2 (two) times daily.   Insulin  Glargine Solostar (LANTUS ) 100 UNIT/ML Solostar Pen Inject 50 Units into the skin in the morning and at bedtime.   insulin  lispro (HUMALOG ) 100 UNIT/ML KwikPen Inject 20 Units into the skin 3 (three) times daily with meals.   loperamide  (IMODIUM ) 2 MG capsule Take 1 capsule (2 mg total) by mouth 4 (four) times daily as needed for diarrhea or loose  stools.   nystatin  cream (MYCOSTATIN ) Apply to affected area 2 times daily   potassium chloride  SA (KLOR-CON  M20) 20 MEQ tablet Take 1 tablet (20 mEq total) by mouth 2 (two) times daily.   rosuvastatin  (CRESTOR ) 20 MG tablet Take 1 tablet (20 mg total) by mouth daily.   senna-docusate (SENOKOT-S) 8.6-50 MG tablet Take 1-2 tablets by mouth 2 (two) times daily between meals as needed for mild constipation or moderate constipation.   spironolactone  (ALDACTONE ) 50 MG tablet Take 1 tablet (50 mg total) by mouth daily.   torsemide  (DEMADEX ) 20 MG tablet Take 2 tablets (40mg ) by mouth twice daily on Tuesday, Thursday and Saturday and take 2 tablets (40mg ) once daily on Sunday, Monday, Wednesday, and Friday   valsartan  (DIOVAN ) 320 MG tablet Take 1 tablet (320 mg total) by mouth daily.     Allergies:   Prochlorperazine  and Contrast media [iodinated contrast media]   Social History   Socioeconomic History   Marital status: Single    Spouse name: Not on file   Number of children: Not on file   Years of education: Not on file   Highest education level: GED or equivalent  Occupational History   Not on file  Tobacco Use   Smoking status: Former    Current packs/day: 0.00    Types: Cigarettes    Quit date: 10/08/2023    Years since quitting: 1.0   Smokeless tobacco: Never  Vaping Use   Vaping status: Former  Substance and Sexual Activity   Alcohol use: Not Currently    Comment: occ   Drug use: No   Sexual activity: Not on file  Other Topics Concern   Not on file  Social History Narrative   Are you right handed or left handed? right   Are you currently employed ? yes   What is your current occupation?[employer]   Do you live at home alone?with kids   Who lives with you?    What type of home do you live in: 1 story or 2 story? two    Caffeine 1-3 a day   Social Drivers of Health   Tobacco Use: Medium Risk (10/08/2024)   Patient History    Smoking Tobacco Use: Former    Smokeless Tobacco  Use: Never    Passive Exposure: Not on file  Financial Resource Strain: Medium Risk (05/11/2024)   Overall Financial Resource Strain (CARDIA)    Difficulty of Paying Living Expenses: Somewhat hard  Food Insecurity: No Food Insecurity (07/23/2024)   Epic    Worried About Programme Researcher, Broadcasting/film/video in the Last Year: Never true    Ran Out of Food in the Last Year: Never true  Transportation Needs: No Transportation Needs (07/23/2024)   Epic    Lack of Transportation (Medical): No  Lack of Transportation (Non-Medical): No  Physical Activity: Insufficiently Active (05/11/2024)   Exercise Vital Sign    Days of Exercise per Week: 1 day    Minutes of Exercise per Session: 10 min  Stress: Stress Concern Present (05/11/2024)   [employer] of Occupational Health - Occupational Stress Questionnaire    Feeling of Stress: To some extent  Social Connections: Socially Isolated (05/11/2024)   Social Connection and Isolation Panel    Frequency of Communication with Friends and Family: Once a week    Frequency of Social Gatherings with Friends and Family: Once a week    Attends Religious Services: 1 to 4 times per year    Active Member of Clubs or Organizations: No    Attends Engineer, Structural: Not on file    Marital Status: Separated  Depression (PHQ2-9): Low Risk (07/25/2024)   Depression (PHQ2-9)    PHQ-2 Score: 0  Alcohol Screen: Low Risk (05/11/2024)   Alcohol Screen    Last Alcohol Screening Score (AUDIT): 0  Housing: Unknown (07/23/2024)   Epic    Unable to Pay for Housing in the Last Year: No    Number of Times Moved in the Last Year: Not on file    Homeless in the Last Year: No  Recent Concern: Housing - High Risk (05/11/2024)   Epic    Unable to Pay for Housing in the Last Year: Yes    Number of Times Moved in the Last Year: 1    Homeless in the Last Year: No  Utilities: Not At Risk (07/23/2024)   Epic    Threatened with loss of utilities: No  Health Literacy: Not on file      Family History: The patient's family history includes Diabetes in her father; Hypertension in her father and mother.  ROS:   Review of Systems  Constitution: Negative for decreased appetite, fever and weight gain.  HENT: Negative for congestion, ear discharge, hoarse voice and sore throat.   Eyes: Negative for discharge, redness, vision loss in right eye and visual halos.  Cardiovascular: Negative for chest pain, dyspnea on exertion, leg swelling, orthopnea and palpitations.  Respiratory: Negative for cough, hemoptysis, shortness of breath and snoring.   Endocrine: Negative for heat intolerance and polyphagia.  Hematologic/Lymphatic: Negative for bleeding problem. Does not bruise/bleed easily.  Skin: Negative for flushing, nail changes, rash and suspicious lesions.  Musculoskeletal: Negative for arthritis, joint pain, muscle cramps, myalgias, neck pain and stiffness.  Gastrointestinal: Negative for abdominal pain, bowel incontinence, diarrhea and excessive appetite.  Genitourinary: Negative for decreased libido, genital sores and incomplete emptying.  Neurological: Negative for brief paralysis, focal weakness, headaches and loss of balance.  Psychiatric/Behavioral: Negative for altered mental status, depression and suicidal ideas.  Allergic/Immunologic: Negative for HIV exposure and persistent infections.    EKGs/Labs/Other Studies Reviewed:    The following studies were reviewed today:   EKG:  None today   Recent Labs: 07/15/2024: Pro Brain Natriuretic Peptide 1,386.0 07/16/2024: B Natriuretic Peptide 141.4; TSH 0.298 07/25/2024: Hemoglobin 10.8; Platelets 421 10/01/2024: ALT 13; BUN 9; Creatinine, Ser 1.09; Magnesium  1.8; Potassium 4.3; Sodium 131  Recent Lipid Panel    Component Value Date/Time   CHOL 178 10/29/2023 1148   CHOL 172 12/29/2021 0919   TRIG 417 (H) 10/29/2023 1148   HDL 40 (L) 10/29/2023 1148   HDL 34 (L) 12/29/2021 0919   CHOLHDL 4.5 10/29/2023 1148    VLDL 45 (H) 10/14/2023 0554   LDLCALC  10/29/2023 1148  Comment:     . LDL cholesterol not calculated. Triglyceride levels greater than 400 mg/dL invalidate calculated LDL results. . Reference range: <100 . Desirable range <100 mg/dL for primary prevention;   <70 mg/dL for patients with CHD or diabetic patients  with > or = 2 CHD risk factors. SABRA LDL-C is now calculated using the Martin-Hopkins  calculation, which is a validated novel method providing  better accuracy than the Friedewald equation in the  estimation of LDL-C.  Gladis APPLETHWAITE et al. SANDREA. 7986;689(80): 2061-2068  (http://education.[facility].com/faq/FAQ164)    LDLDIRECT 64.0 03/26/2023 1009    Physical Exam:    VS:  BP (!) 168/90 (BP Location: Left Arm, Patient Position: Sitting, Cuff Size: Large)   Pulse 87   Ht 5' 7 (1.702 m)   SpO2 99%   BMI 44.87 kg/m     Wt Readings from Last 3 Encounters:  10/07/24 286 lb 8 oz (130 kg)  10/01/24 291 lb 6.4 oz (132.2 kg)  09/05/24 278 lb 4.8 oz (126.2 kg)     GEN: Well nourished, well developed in no acute distress HEENT: Normal NECK: No JVD; No carotid bruits LYMPHATICS: No lymphadenopathy CARDIAC: S1S2 noted,RRR, no murmurs, rubs, gallops RESPIRATORY:  Clear to auscultation without rales, wheezing or rhonchi  ABDOMEN: Soft, non-tender, non-distended, +bowel sounds, no guarding. EXTREMITIES: +3 Pretibial LE, No cyanosis, no clubbing MUSCULOSKELETAL:  No deformity  SKIN: Warm and dry NEUROLOGIC:  Alert and oriented x 3, non-focal PSYCHIATRIC:  Normal affect, good insight  ASSESSMENT:    1. Chronic systolic heart failure (HCC)   2. Medication management   3. SOB (shortness of breath)   4. Postural dizziness with presyncope   5. Hypertensive heart disease with heart failure (HCC)   6. History of CVA (cerebrovascular accident)    PLAN:    Presyncope-while in the office it appears that she was going to pass out.  Now with her history of multiple  strokes it is important that the patient be seen in the emergency department hopefully to get a CT of the head.  In addition she will require admission for acute on chronic diastolic heart failure exacerbation.  She did get some improvement with the Furoscix  but still clinically appears to be fluid overloaded and I think IV Lasix  will be the best thing for the patient at this time.    We will send her via ambulance given the acuity of her symptoms is better that she be transported.  Of note .  Prior to the episode of presyncope she was leaving, I had discussed with the patient to use a couple days of Furoscix  for hospitalization.  But with this change it is important that she get seen in the emergency department given her history of CVA as well.   Hypertension-blood pressure is not at target in the office today.  Will continue her Coreg  25 mg twice daily, hydralazine  50 mg 3 times daily, valsartan  320 mg and Aldactone  50 mg daily.  Chest pain - She reports some chest discomfort which has improved.  Will continue to monitor the patient closely should this continue to happen I will send her for cardiac catheterization given her coronary CTA was inconclusive.   Hx of Cva - cont current medication regimen.   Remains a non-smoker.  The patient understands the need to lose weight with diet and exercise. We have discussed specific strategies for this.   The patient is in agreement with the above plan. The patient left the office in  stable condition.  The patient will follow up in   Medication Adjustments/Labs and Tests Ordered: Current medicines are reviewed at length with the patient today.  Concerns regarding medicines are outlined above.  Orders Placed This Encounter  Procedures   Comprehensive metabolic panel with GFR   Magnesium    Pro b natriuretic peptide (BNP)   Meds ordered this encounter  Medications   Furosemide  (FUROSCIX ) 80 MG/10ML CTKT    Sig: Inject 80 mg into the skin daily  as needed (for 5 days).    Dispense:  5 each    Refill:  0    Prescription Type::   Renewal    Patient Instructions  Medication Instructions:  Please use Furoscix  as directed for 5 days. Continue all other medications as listed.  *If you need a refill on your cardiac medications before your next appointment, please call your pharmacy*  Lab Work: Please have blood work today (CMP, MG, BNP) If you have labs (blood work) drawn today and your tests are completely normal, you will receive your results only by: MyChart Message (if you have MyChart) OR A paper copy in the mail If you have any lab test that is abnormal or we need to change your treatment, we will call you to review the results.   Follow-Up: At [facility], you and your health needs are our priority.  As part of our continuing mission to provide you with exceptional heart care, our providers are all part of one team.  This team includes your primary Cardiologist (physician) and Advanced Practice Providers or APPs (Physician Assistants and Nurse Practitioners) who all work together to provide you with the care you need, when you need it.  Your next appointment:   10/21/24 at 9:40 am.  Provider:   Aiken Withem, DO    We recommend signing up for the patient portal called MyChart.  Sign up information is provided on this After Visit Summary.  MyChart is used to connect with patients for Virtual Visits (Telemedicine).  Patients are able to view lab/test results, encounter notes, upcoming appointments, etc.  Non-urgent messages can be sent to your provider as well.   To learn more about what you can do with MyChart, go to [email or web address].               Adopting a Healthy Lifestyle.  Know what a healthy weight is for you (roughly BMI <25) and aim to maintain this   Aim for 7+ servings of fruits and vegetables daily   65-80+ fluid ounces of water  or unsweet tea for healthy kidneys   Limit to max 1  drink of alcohol per day; avoid smoking/tobacco   Limit animal fats in diet for cholesterol and heart health - choose grass fed whenever available   Avoid highly processed foods, and foods high in saturated/trans fats   Aim for low stress - take time to unwind and care for your mental health   Aim for 150 min of moderate intensity exercise weekly for heart health, and weights twice weekly for bone health   Aim for 7-9 hours of sleep daily   When it comes to diets, agreement about the perfect plan isnt easy to find, even among the experts. Experts at the [facility] [employer] developed an idea known as the Healthy Eating Plate. Just imagine a plate divided into logical, healthy portions.   The emphasis is on diet quality:   Load up on vegetables and fruits - one-half of your  plate: Aim for color and variety, and remember that potatoes dont count.   Go for whole grains - one-quarter of your plate: Whole wheat, barley, wheat berries, quinoa, oats, brown rice, and foods made with them. If you want pasta, go with whole wheat pasta.   Protein power - one-quarter of your plate: Fish, chicken, beans, and nuts are all healthy, versatile protein sources. Limit red meat.   The diet, however, does go beyond the plate, offering a few other suggestions.   Use healthy plant oils, such as olive, canola, soy, corn, sunflower and peanut. Check the labels, and avoid partially hydrogenated oil, which have unhealthy trans fats.   If youre thirsty, drink water . Coffee and tea are good in moderation, but skip sugary drinks and limit milk and dairy products to one or two daily servings.   The type of carbohydrate in the diet is more important than the amount. Some sources of carbohydrates, such as vegetables, fruits, whole grains, and beans-are healthier than others.   Finally, stay active  Signed, Dub Huntsman, DO  10/08/2024 9:44 AM    [facility] "

## 2024-10-08 NOTE — ED Notes (Signed)
 CCMD called and verified patient on cardiac telemetry

## 2024-10-08 NOTE — ED Provider Notes (Signed)
 " [place] EMERGENCY DEPARTMENT AT [facility] Provider Note   CSN: [id number] Arrival date & time: 10/08/24  1036     Patient presents with: Near Syncope and Hyperglycemia   Carolyn Zimmerman is a 39 y.o. female.   39 year old female presenting with multiple complaints.  Patient was at her cardiologist visit this morning when she had a presyncopal event, reported lightheaded sensation when she stood up, she did not lose consciousness or fall however her cardiologist recommended that she present to the ED for further workup, given the fact that she does have a history of diastolic heart failure as well as a prior CVA.  Patient was found to be hyperglycemic with EMS with blood glucose in the 500s, patient is type II diabetic and insulin -dependent, she admits that she has not been feeling well for approximately 1 week and has not been using her insulin  as a result of this.  She describes a sore throat/scratchy throat as well as subjective fevers at home, generalized abdominal pain that has been ongoing daily with associated nausea but no vomiting, reports somewhat mild chest pain but states that this is her baseline.  Patient mentions frequent urination as well as mouth feeling dry today. Was told by her cardiologist today that she would likely require admission for IV diuresis.    Near Syncope  Hyperglycemia      Prior to Admission medications  Medication Sig Start Date End Date Taking? Authorizing Provider  acetaminophen  (TYLENOL ) 500 MG tablet Take 1,000 mg by mouth every 6 (six) hours as needed for mild pain (pain score 1-3) or moderate pain (pain score 4-6).    [provider]  albuterol  (VENTOLIN  HFA) 108 (90 Base) MCG/ACT inhaler Inhale 2 puffs into the lungs every 4 (four) hours as needed for wheezing or shortness of breath. 11/02/23   Gherghe, Costin M, MD  aspirin  81 MG chewable tablet Chew 1 tablet (81 mg total) by mouth daily. 10/29/23   Ngetich, [name], NP   bismuth subsalicylate (PEPTO BISMOL) 262 MG/15ML suspension Take 30 mLs by mouth every 6 (six) hours as needed for indigestion or diarrhea or loose stools.    [provider]  carvedilol  (COREG ) 25 MG tablet Take 1 tablet (25 mg total) by mouth 2 (two) times daily. 11/02/23   Gherghe, Costin M, MD  Continuous Glucose Receiver (FREESTYLE LIBRE 3 READER) DEVI Use as directed for continuous glucose monitoring. 04/15/24   [provider]  Continuous Glucose Sensor (DEXCOM G7 SENSOR) MISC 1 Application by Does not apply route daily. 07/25/24   Ngetich, [name], NP  Continuous Glucose Sensor (FREESTYLE LIBRE 3 PLUS SENSOR) MISC Replace every 15 days 04/15/24   [provider]  Furosemide  (FUROSCIX ) 80 MG/10ML CTKT Inject 80 mg into the skin daily as needed (for 5 days). 10/08/24   Tobb, Kardie, DO  gabapentin  (NEURONTIN ) 300 MG capsule Take 1 capsule (300 mg total) by mouth 2 (two) times daily. 07/22/24   Gonfa, Taye T, MD  hydrALAZINE  (APRESOLINE ) 50 MG tablet Take 1 tablet (50 mg total) by mouth 3 (three) times daily. 09/05/24 12/04/24  Tobb, Kardie, DO  hydrOXYzine  (ATARAX ) 10 MG tablet Take 1 tablet (10 mg total) by mouth as needed for anxiety. 05/14/24   Ngetich, [name], NP  icosapent Ethyl (VASCEPA) 1 g capsule Take 2 g by mouth 2 (two) times daily.    [provider]  Insulin  Glargine Solostar (LANTUS ) 100 UNIT/ML Solostar Pen Inject 50 Units into the skin  in the morning and at bedtime. 08/05/24   Ngetich, [name], NP  insulin  lispro (HUMALOG ) 100 UNIT/ML KwikPen Inject 20 Units into the skin 3 (three) times daily with meals. 08/05/24   Ngetich, [name], NP  loperamide  (IMODIUM ) 2 MG capsule Take 1 capsule (2 mg total) by mouth 4 (four) times daily as needed for diarrhea or loose stools. 05/23/24   Mannie Fairy DASEN, DO  nystatin  cream (MYCOSTATIN ) Apply to affected area 2 times daily 05/23/24   Mannie Fairy T, DO  potassium chloride  SA (KLOR-CON  M20) 20 MEQ tablet Take 1  tablet (20 mEq total) by mouth 2 (two) times daily. 10/01/24 12/30/24  Tobb, Kardie, DO  rosuvastatin  (CRESTOR ) 20 MG tablet Take 1 tablet (20 mg total) by mouth daily. 11/02/23   Gherghe, Costin M, MD  senna-docusate (SENOKOT-S) 8.6-50 MG tablet Take 1-2 tablets by mouth 2 (two) times daily between meals as needed for mild constipation or moderate constipation. 07/22/24   Gonfa, Taye T, MD  spironolactone  (ALDACTONE ) 50 MG tablet Take 1 tablet (50 mg total) by mouth daily. 05/14/24   Ngetich, [name], NP  torsemide  (DEMADEX ) 20 MG tablet Take 2 tablets (40mg ) by mouth twice daily on Tuesday, Thursday and Saturday and take 2 tablets (40mg ) once daily on Sunday, Monday, Wednesday, and Friday 10/01/24   Tobb, Kardie, DO  valsartan  (DIOVAN ) 320 MG tablet Take 1 tablet (320 mg total) by mouth daily. 05/14/24   Ngetich, [name], NP    Allergies: Prochlorperazine  and Contrast media [iodinated contrast media]    Review of Systems  Cardiovascular:  Positive for near-syncope.    Updated Vital Signs  Vitals:   10/08/24 1040 10/08/24 1043 10/08/24 1045  BP: (!) 150/87    Pulse:  81   Resp:  16   Temp:   97.8 F (36.6 C)  TempSrc:   Oral  SpO2:  100%   Weight:  129.9 kg   Height:  5' 7 (1.702 m)      Physical Exam Vitals and nursing note reviewed.  Constitutional:      General: She is not in acute distress.    Appearance: Normal appearance.  HENT:     Head: Normocephalic and atraumatic.     Mouth/Throat:     Mouth: Mucous membranes are dry.     Comments: Trace erythema in the posterior oropharynx, no tonsillar swelling/exudate, uvula midline, normal phonation, no oral candidiasis Eyes:     Extraocular Movements: Extraocular movements intact.     Pupils: Pupils are equal, round, and reactive to light.  Cardiovascular:     Rate and Rhythm: Normal rate and regular rhythm.  Pulmonary:     Effort: Pulmonary effort is normal. No respiratory distress.     Breath sounds: Normal breath sounds.   Abdominal:     Palpations: Abdomen is soft.     Tenderness: There is abdominal tenderness (generalized). There is no guarding.  Musculoskeletal:     Cervical back: Normal range of motion and neck supple.     Comments: Moves all extremities spontaneously without difficulty  Skin:    General: Skin is warm and dry.  Neurological:     General: No focal deficit present.     Mental Status: She is alert and oriented to person, place, and time.     (all labs ordered are listed, but only abnormal results are displayed) Labs Reviewed  CBC WITH DIFFERENTIAL/PLATELET - Abnormal; Notable for the following components:      Result Value  Hemoglobin 11.9 (*)    HCT 35.7 (*)    All other components within normal limits  COMPREHENSIVE METABOLIC PANEL WITH GFR - Abnormal; Notable for the following components:   Sodium 131 (*)    Chloride 95 (*)    Glucose, Bld 474 (*)    Calcium  8.3 (*)    Albumin 3.0 (*)    Alkaline Phosphatase 135 (*)    All other components within normal limits  PRO BRAIN NATRIURETIC PEPTIDE - Abnormal; Notable for the following components:   Pro Brain Natriuretic Peptide 783.0 (*)    All other components within normal limits  URINALYSIS, ROUTINE W REFLEX MICROSCOPIC - Abnormal; Notable for the following components:   Color, Urine STRAW (*)    Glucose, UA >=500 (*)    Hgb urine dipstick MODERATE (*)    Protein, ur 100 (*)    All other components within normal limits  LIPASE, BLOOD - Abnormal; Notable for the following components:   Lipase 60 (*)    All other components within normal limits  CBG MONITORING, ED - Abnormal; Notable for the following components:   Glucose-Capillary 578 (*)    All other components within normal limits  I-STAT VENOUS BLOOD GAS, ED - Abnormal; Notable for the following components:   pCO2, Ven 42.2 (*)    Sodium 133 (*)    Potassium 3.4 (*)    Calcium , Ion 1.03 (*)    All other components within normal limits  I-STAT CHEM 8, ED -  Abnormal; Notable for the following components:   Sodium 133 (*)    Potassium 3.4 (*)    Chloride 97 (*)    Glucose, Bld 473 (*)    Calcium , Ion 1.06 (*)    All other components within normal limits  CBG MONITORING, ED - Abnormal; Notable for the following components:   Glucose-Capillary 374 (*)    All other components within normal limits  TROPONIN T, HIGH SENSITIVITY - Abnormal; Notable for the following components:   Troponin T High Sensitivity 27 (*)    All other components within normal limits  TROPONIN T, HIGH SENSITIVITY - Abnormal; Notable for the following components:   Troponin T High Sensitivity 26 (*)    All other components within normal limits  RESP PANEL BY RT-PCR (RSV, FLU A&B, COVID)  [id number]  GROUP A STREP BY PCR  BETA-HYDROXYBUTYRIC ACID  HCG, SERUM, QUALITATIVE    EKG: None  Radiology: DG Chest Portable 1 View Result Date: 10/08/2024 CLINICAL DATA:  Hyperglycemia with possible CHF exacerbation. EXAM: PORTABLE CHEST 1 VIEW COMPARISON:  July 15, 2024 FINDINGS: The cardiac silhouette is mildly enlarged and unchanged in size. Low lung volumes are noted. No acute infiltrate, pleural effusion or pneumothorax is identified. The visualized skeletal structures are unremarkable. IMPRESSION: Low lung volumes without acute or active cardiopulmonary disease. Electronically Signed   By: Suzen Dials M.D.   On: 10/08/2024 13:33     Procedures   Medications Ordered in the ED  insulin  aspart (novoLOG ) injection 10 Units (10 Units Subcutaneous Given 10/08/24 1310)    Clinical Course as of 10/08/24 1530  Wed Oct 08, 2024  1526 H/o CHF. Here for presyncope. Sent here from cardiologists for possible admission for CHF. Dr. Sheena recommended admission but clinically here does not appear volume overloaded. Sugar is 500s here but not in DKA. Not taking insulin  for a week. Also awaiting for head CT.  [JR]    Clinical Course User Index [JR] Robinson, John K, PA-C  Medical Decision Making This patient presents to the ED for concern of multiple complaints, this involves an extensive number of treatment options, and is a complaint that carries with it a high risk of complications and morbidity.  The differential diagnosis includes hyperglycemia due to poor medication compliance, diabetic ketoacidosis, HHS, CHF exacerbation, fluid overload    Co morbidities that complicate the patient evaluation  diastolic heart failure, history of CVA   Additional history obtained:  Additional history obtained from record review External records from outside source obtained and reviewed including cardiology note from earlier today   Lab Tests:  I Ordered, and personally interpreted labs.  The pertinent results include: Initial CBG 578.  CBC without leukocytosis, hemoglobin of 11.9 is improved from most recent baseline of 10.8.  CMP notable for mildly elevated alk phos at 135.  Urinalysis notable for glucosuria with protein/RBCs, no ketones.  Respiratory panel negative.  Group A strep negative.  VBG notable for pH of 7.406, no acidosis.  Initial troponin of 26 with repeat of 27, consistent with baseline.  proBNP today of 783, this is suggestive of acute heart failure exacerbation, however this is improved from her most recent baseline.  Lipase mildly elevated at 60, has been similarly elevated in the past.  Serum ketones unremarkable, 0.12. CBG has improved to 374 after administration of insulin .   Imaging Studies ordered:  I ordered imaging studies including CXR, head CT  I independently visualized and interpreted imaging which showed  - CXR: Low lung volumes without acute or active cardiopulmonary disease. - head CT: pending at time of shift change I agree with the radiologist interpretation   Cardiac Monitoring: / EKG:  The patient was maintained on a cardiac monitor.  I personally viewed and interpreted the cardiac monitored which showed  an underlying rhythm of: NSR   Consultations Obtained:  I requested consultation with the cardiology service,  and discussed lab and imaging findings as well as pertinent plan - they recommend: consult call pending at time of shift change   Problem List / ED Course / Critical interventions / Medication management  I ordered medication including 10U Novolog   for hyperglycemia  Reevaluation of the patient after these medicines showed that the patient improved I have reviewed the patients home medicines and have made adjustments as needed   Social Determinants of Health:  Former tobacco use   Test / Admission - Considered:  Physical exam is notable as above, dry mucous membranes, abdomen is soft with generalized tenderness but no guarding or rigidity.  Notably hyperglycemic in the upper 500s at initial assessment, concern for DKA.  Patient admits she has not used her insulin  in approximately 1 week, she is an insulin -dependent type 2 diabetic.   Patient sent from her cardiologist office this morning for a presyncopal episode, per review of their note they do feel that she would benefit from hospitalization for IV diuresis, as she has had a poor response to previous methods. Labs are largely reassuring as above, patient does not appear to be in ketoacidosis at this time as she is not acidotic on VBG and does not have any urine ketones.  Respiratory panel and strep negative, sore throat likely secondary to dehydration/dry mouth due to hyperglycemia. Will reassess CBG after administration of insulin .  CBG improved to 374 Will discuss patient's case with cardiology, as she may benefit from admission as discussed previously by her cardiologist, Dr. Sheena. Staffed with Dr. Ruthe   Consult call with cardiology pending at shift  change, patient handed off to oncoming EDP Norleen Essex PA-C, see their note for discussion with cardiology and dispo decision.  Amount and/or Complexity of Data  Reviewed Labs: ordered. Radiology: ordered.  Risk Prescription drug management.        Final diagnoses:  Hyperglycemia    ED Discharge Orders     None          Glendia Rocky SAILOR, [place] 10/08/24 1530  "

## 2024-10-08 NOTE — TOC CM/SW Note (Signed)
 TOC consult received for d/c planning needs. Follow-up to be completed with patient as appropriate.   Merilee Batty, MSN, RN Case Management [phone]

## 2024-10-08 NOTE — ED Provider Notes (Signed)
 Patient signed out to me by PA Rocky Hamilton.  Was sent here by cardiologist for possible admission for CHF exacerbation and presyncope with hypertension at her clinic earlier today.  Workup largely reassuring here.  Will discussed with cardiology.  Anticipate admission. Physical Exam  BP (!) 154/98   Pulse 74   Temp 98.3 F (36.8 C) (Oral)   Resp 18   Ht 5' 7 (1.702 m)   Wt 129.9 kg   LMP 10/08/2024   SpO2 100%   BMI 44.87 kg/m   Physical Exam  Procedures  Procedures  ED Course / MDM   Clinical Course as of 10/08/24 1633  Wed Oct 08, 2024  1526 H/o CHF. Here for presyncope. Sent here from cardiologists for possible admission for CHF. Dr. Sheena recommended admission but clinically here does not appear volume overloaded. Sugar is 500s here but not in DKA. Not taking insulin  for a week. Also awaiting for head CT.  [JR]    Clinical Course User Index [JR] Haruo Stepanek K, PA-C   Medical Decision Making Amount and/or Complexity of Data Reviewed Labs: ordered. Radiology: ordered.  Risk Prescription drug management. Decision regarding hospitalization.   Discussed patient with Dr. Sheena who advised admission.  She states that even though her BNP is down likely attributed to recent subcu Lasix  administration for more assertive diuresis in the last few days.  However does feel that she is still holding a considerable amount of fluid and mention that her edema in her lower legs today in clinic was 3+ bilaterally and pitting.  Mated to hospital service with Dr. Tobie.  Ordered a dose of 40 mg IV Lasix  with K supplement.       Lang Norleen POUR, PA-C 10/08/24 1636    Tegeler, Lonni PARAS, MD 10/08/24 [phone]

## 2024-10-08 NOTE — H&P (Signed)
 " History and Physical    Patient: Carolyn Zimmerman FMW:[id number] DOB: 11-Jul-1986 DOA: 10/08/2024 DOS: the patient was seen and examined on 10/08/2024 . PCP: Leonarda Roxan BROCKS, NP  Patient coming from: Cardiology office.  Chief complaint: Chief Complaint  Patient presents with   Near Syncope   Hyperglycemia   HPI:  Carolyn Zimmerman is a 39 y.o. female with past medical history  of  chronic heart failure, type 2 diabetes, CVA in October 2023 acute left cerebral hemisphere compatible with watershed infarct ,and hypertension,came from cardiology office for dizziness and elevated BP.  Patient states that she has been following up with cardiology closely for her heart failure and her swelling and was started on shots for her heart failure and she went back today  to receive her shot and she was noted to be a little dizzy and was told to come to the emergency room.  ED Course:  Vital signs in the ED were notable for the following:  Vitals:   10/08/24 1500 10/08/24 1530 10/08/24 1556 10/08/24 1557  BP:  (!) 129/102  (!) 154/98  Pulse:  77  74  Temp:   98.3 F (36.8 C)   Resp: 12 19  18   Height:      Weight:      SpO2:  98%  100%  TempSrc:   Oral   BMI (Calculated):       >>ED evaluation thus far shows: Initial CMP shows sodium 131, chloride 95, normal kidney function, and alk phos 135, and lipase 60 and normal LFT.  ProBNP 783.  CBC with normal 7.5 and hb 11.9 and 315.  BHB of 0.12.  Resp panel negative for flu,rsv and covid.  Urinalysis shows moderate >500 and straw. Chest x-ray shows low lung volumes without acute cardiopulmonary disease. EKG shows sinus rhythm 82 PR 182 and QTc prolonged at 525 no ischemic changes nonspecific T wave changes in leads III.   >>While in the ED patient received the following: Medications  insulin  aspart (novoLOG ) injection 10 Units (10 Units Subcutaneous Given 10/08/24 1310)   Review of Systems  Cardiovascular:  Positive for leg swelling.  Neurological:   Positive for dizziness.   Past Medical History:  Diagnosis Date   Abscess of chest wall 06/15/2017   Cellulitis    Coronavirus infection 05/02/2020   CVA (cerebral vascular accident) (HCC) 07/14/2022   L MCA CVA, watershed infarct   Diabetes mellitus    Heart failure with mildly reduced ejection fraction (HFmrEF) (HCC) 09/29/2021   EF 45%, moderate LVH   Hyperlipidemia associated with type 2 diabetes mellitus (HCC)    Hypertension    Jaundice    Obesity    TOA (tubo-ovarian abscess) 09/2019   Past Surgical History:  Procedure Laterality Date   CHOLECYSTECTOMY     IRRIGATION AND DEBRIDEMENT ABSCESS N/A 05/02/2020   Procedure: IRRIGATION AND DEBRIDEMENT ABDOMINAL and CHEST WALL NECROTIZING FASCITITS;  Surgeon: Sheldon Standing, MD;  Location: [facility];  Service: General;  Laterality: N/A;   liver stent      reports that she quit smoking about a year ago. Her smoking use included cigarettes. She has never used smokeless tobacco. She reports that she does not currently use alcohol. She reports that she does not use drugs. Allergies[1] Family History  Problem Relation Age of Onset   Hypertension Mother    Diabetes Father    Hypertension Father    Prior to Admission medications  Medication Sig Start Date End Date Taking? Authorizing Provider  acetaminophen  (TYLENOL ) 500 MG tablet Take 1,000 mg by mouth every 6 (six) hours as needed for mild pain (pain score 1-3) or moderate pain (pain score 4-6).   Yes [provider]  albuterol  (VENTOLIN  HFA) 108 (90 Base) MCG/ACT inhaler Inhale 2 puffs into the lungs every 4 (four) hours as needed for wheezing or shortness of breath. 11/02/23  Yes Trixie Nilda HERO, MD  aspirin  81 MG chewable tablet Chew 1 tablet (81 mg total) by mouth daily. 10/29/23  Yes Ngetich, [name], NP  bismuth subsalicylate (PEPTO BISMOL) 262 MG/15ML suspension Take 30 mLs by mouth every 6 (six) hours as needed for indigestion or diarrhea or loose stools.   Yes [provider]  carvedilol  (COREG ) 25 MG tablet Take 1 tablet (25 mg total) by mouth 2 (two) times daily. 11/02/23  Yes Gherghe, Costin M, MD  CINNAMON PO Take 1 capsule by mouth daily at 12 noon.   Yes [provider]  Furosemide  (FUROSCIX ) 80 MG/10ML CTKT Inject 80 mg into the skin daily as needed (for 5 days). 10/08/24  Yes Tobb, Kardie, DO  gabapentin  (NEURONTIN ) 300 MG capsule Take 1 capsule (300 mg total) by mouth 2 (two) times daily. 07/22/24  Yes Gonfa, Taye T, MD  hydrALAZINE  (APRESOLINE ) 50 MG tablet Take 1 tablet (50 mg total) by mouth 3 (three) times daily. Patient taking differently: Take 50-100 mg by mouth in the morning and at bedtime. Take two tablets in the morning at once. Take one tablet in the evening. 09/05/24 12/04/24 Yes Tobb, Kardie, DO  hydrOXYzine  (ATARAX ) 10 MG tablet Take 1 tablet (10 mg total) by mouth as needed for anxiety. 05/14/24  Yes Ngetich, [name], NP  icosapent Ethyl (VASCEPA) 1 g capsule Take 2 g by mouth 2 (two) times daily.   Yes [provider]  Insulin  Glargine Solostar (LANTUS ) 100 UNIT/ML Solostar Pen Inject 50 Units into the skin in the morning and at bedtime. 08/05/24  Yes Ngetich, [name], NP  insulin  lispro (HUMALOG ) 100 UNIT/ML KwikPen Inject 20 Units into the skin 3 (three) times daily with meals. 08/05/24  Yes Ngetich, [name], NP  loperamide  (IMODIUM ) 2 MG capsule Take 1 capsule (2 mg total) by mouth 4 (four) times daily as needed for diarrhea or loose stools. 05/23/24  Yes Mannie Pac T, DO  potassium chloride  SA (KLOR-CON  M20) 20 MEQ tablet Take 1 tablet (20 mEq total) by mouth 2 (two) times daily. 10/01/24 12/30/24 Yes Tobb, Kardie, DO  rosuvastatin  (CRESTOR ) 20 MG tablet Take 1 tablet (20 mg total) by mouth daily. 11/02/23  Yes Gherghe, Costin M, MD  senna-docusate (SENOKOT-S) 8.6-50 MG tablet Take 1-2 tablets by mouth 2 (two) times daily between meals as needed for mild constipation or moderate constipation. 07/22/24  Yes Gonfa, Taye  T, MD  spironolactone  (ALDACTONE ) 50 MG tablet Take 1 tablet (50 mg total) by mouth daily. 05/14/24  Yes Ngetich, [name], NP  torsemide  (DEMADEX ) 20 MG tablet Take 2 tablets (40mg ) by mouth twice daily on Tuesday, Thursday and Saturday and take 2 tablets (40mg ) once daily on Sunday, Monday, Wednesday, and Friday 10/01/24  Yes Tobb, Kardie, DO  valsartan  (DIOVAN ) 320 MG tablet Take 1 tablet (320 mg total) by mouth daily. 05/14/24  Yes Ngetich, [name], NP  Continuous Glucose Receiver (FREESTYLE LIBRE 3 READER) DEVI Use as directed for continuous glucose monitoring. Patient not taking: Reported on 10/08/2024 04/15/24   [provider]  Continuous Glucose Sensor (DEXCOM G7 SENSOR) MISC  1 Application by Does not apply route daily. Patient not taking: Reported on 10/08/2024 07/25/24   Ngetich, Roxan BROCKS, NP  Continuous Glucose Sensor (FREESTYLE LIBRE 3 PLUS SENSOR) MISC Replace every 15 days Patient not taking: Reported on 10/08/2024 04/15/24   [provider]  nystatin  cream (MYCOSTATIN ) Apply to affected area 2 times daily Patient not taking: Reported on 10/08/2024 05/23/24   Mannie Pac T, DO                                                                                 Vitals:   10/08/24 1500 10/08/24 1530 10/08/24 1556 10/08/24 1557  BP:  (!) 129/102  (!) 154/98  Pulse:  77  74  Resp: 12 19  18   Temp:   98.3 F (36.8 C)   TempSrc:   Oral   SpO2:  98%  100%  Weight:      Height:       Physical Exam Vitals and nursing note reviewed.  Constitutional:      General: She is not in acute distress. HENT:     Head: Normocephalic and atraumatic.     Right Ear: Hearing normal.     Left Ear: Hearing normal.     Nose: No nasal deformity.     Mouth/Throat:     Lips: Pink.  Eyes:     General: Lids are normal.     Extraocular Movements: Extraocular movements intact.  Cardiovascular:     Rate and Rhythm: Normal rate and regular rhythm.     Heart sounds: Normal heart sounds.   Pulmonary:     Effort: Pulmonary effort is normal.     Breath sounds: Normal breath sounds.  Abdominal:     General: Bowel sounds are normal. There is no distension.     Palpations: Abdomen is soft. There is no mass.     Tenderness: There is no abdominal tenderness.  Musculoskeletal:     Right lower leg: No edema.     Left lower leg: No edema.  Skin:    General: Skin is warm.  Neurological:     General: No focal deficit present.     Mental Status: She is alert and oriented to person, place, and time.     Comments: CN grossly intact.    Psychiatric:        Attention and Perception: Attention normal.        Mood and Affect: Mood normal.        Speech: Speech normal.        Behavior: Behavior normal.     Labs on Admission: I have personally reviewed following labs and imaging studies CBC: Recent Labs  Lab 10/08/24 1155 10/08/24 1258 10/08/24 1259  WBC 7.5  --   --   NEUTROABS 4.2  --   --   HGB 11.9* 12.6 12.2  HCT 35.7* 37.0 36.0  MCV 82.3  --   --   PLT 315  --   --    Basic Metabolic Panel: Recent Labs  Lab 10/08/24 1155 10/08/24 1258 10/08/24 1259  NA 131* 133* 133*  K 3.7 3.4* 3.4*  CL 95* 97*  --  CO2 25  --   --   GLUCOSE 474* 473*  --   BUN 13 14  --   CREATININE 0.78 0.80  --   CALCIUM  8.3*  --   --    GFR: Estimated Creatinine Clearance: 136.6 mL/min (by C-G formula based on SCr of 0.8 mg/dL). Liver Function Tests: Recent Labs  Lab 10/08/24 1155  AST 27  ALT 19  ALKPHOS 135*  BILITOT 0.2  PROT 7.1  ALBUMIN 3.0*   Recent Labs  Lab 10/08/24 1155  LIPASE 60*   No results for input(s): AMMONIA in the last 168 hours. Recent Labs    07/17/24 0301 07/18/24 0239 07/19/24 0655 07/20/24 1101 07/21/24 0556 07/22/24 0245 07/25/24 1128 10/01/24 1026 10/08/24 1155 10/08/24 1258  BUN 10 12 11 11 13 13 16 9 13 14   CREATININE 0.98 1.01* 1.01* 0.89 0.88 1.04* 0.90 1.09* 0.78 0.80    Cardiac Enzymes: No results for input(s): CKTOTAL,  CKMB, CKMBINDEX, TROPONINI in the last 168 hours. BNP (last 3 results) Recent Labs    02/01/24 2319 07/15/24 1803 10/08/24 1155  PROBNP 353.0* 1,386.0* 783.0*   HbA1C: No results for input(s): HGBA1C in the last 72 hours. CBG: Recent Labs  Lab 10/08/24 1050 10/08/24 1446  GLUCAP 578* 374*   Lipid Profile: No results for input(s): CHOL, HDL, LDLCALC, TRIG, CHOLHDL, LDLDIRECT in the last 72 hours. Thyroid  Function Tests: No results for input(s): TSH, T4TOTAL, FREET4, T3FREE, THYROIDAB in the last 72 hours. Anemia Panel: No results for input(s): VITAMINB12, FOLATE, FERRITIN, TIBC, IRON, RETICCTPCT in the last 72 hours. Urine analysis:    Component Value Date/Time   COLORURINE STRAW (A) 10/08/2024 1142   APPEARANCEUR CLEAR 10/08/2024 1142   LABSPEC 1.011 10/08/2024 1142   PHURINE 6.0 10/08/2024 1142   GLUCOSEU >=500 (A) 10/08/2024 1142   HGBUR MODERATE (A) 10/08/2024 1142   BILIRUBINUR NEGATIVE 10/08/2024 1142   BILIRUBINUR Negative 05/14/2024 1205   KETONESUR NEGATIVE 10/08/2024 1142   PROTEINUR 100 (A) 10/08/2024 1142   UROBILINOGEN negative (A) 05/14/2024 1205   UROBILINOGEN 1.0 01/19/2012 0015   NITRITE NEGATIVE 10/08/2024 1142   LEUKOCYTESUR NEGATIVE 10/08/2024 1142   Radiological Exams on Admission: CT Head Wo Contrast Result Date: 10/08/2024 CLINICAL DATA:  Near syncopal episode. EXAM: CT HEAD WITHOUT CONTRAST TECHNIQUE: Contiguous axial images were obtained from the base of the skull through the vertex without intravenous contrast. RADIATION DOSE REDUCTION: This exam was performed according to the departmental dose-optimization program which includes automated exposure control, adjustment of the mA and/or kV according to patient size and/or use of iterative reconstruction technique. COMPARISON:  01/30/2023 FINDINGS: Brain: No evidence of acute infarction, hemorrhage, hydrocephalus, extra-axial collection or mass lesion/mass  effect. Vascular: No hyperdense vessel or unexpected calcification. Skull: Normal. Negative for fracture or focal lesion. Sinuses/Orbits: No acute finding. Other: None. IMPRESSION: No acute intracranial pathology. Electronically Signed   By: Toribio Agreste M.D.   On: 10/08/2024 15:39   DG Chest Portable 1 View Result Date: 10/08/2024 CLINICAL DATA:  Hyperglycemia with possible CHF exacerbation. EXAM: PORTABLE CHEST 1 VIEW COMPARISON:  July 15, 2024 FINDINGS: The cardiac silhouette is mildly enlarged and unchanged in size. Low lung volumes are noted. No acute infiltrate, pleural effusion or pneumothorax is identified. The visualized skeletal structures are unremarkable. IMPRESSION: Low lung volumes without acute or active cardiopulmonary disease. Electronically Signed   By: Suzen Dials M.D.   On: 10/08/2024 13:33   Data Reviewed: Relevant notes from primary care and specialist visits, past  discharge summaries as available in EHR, including Care Everywhere . Prior diagnostic testing as pertinent to current admission diagnoses, Updated medications and problem lists for reconciliation .ED course, including vitals, labs, imaging, treatment and response to treatment,Triage notes, nursing and pharmacy notes and ED provider's notes.Notable results as noted in HPI.Discussed case with EDMD/ ED APP/ or Specialty MD on call and as needed.  Assessment & Plan  >>Dizziness: D/d include TIA, Hyperglycemia,orthostatic bp changes, Sinus or inner ear issues. We will order MRI brain Noncontrast as pt also reported room spinning sensation and has h/o CVA. Orthostatic vitals. We will also consider VTE eval as pt is high risk for same, dimer screen ordered prior.   >>Sore throat: Evaluation thus far is negative with a negative group A strep PCR, respiratory panel is negative for flu RSV and COVID, differentials could also be reflux related.  Will start patient on empiric IV PPIs.  Patient has had her thyroid   ultrasound as recommended and is waiting for her appointment with the endocrinologist.   >> Hypokalemia: Replaced in the emergency room.   >> Acute on chronic systolic congestive heart failure : Patient clinically appears mildly volume overloaded with an elevated BNP of 783, bilateral lower extremity 2+ edema, no JVD and no rales currently. Strict I's and O's, daily weights. Check urine for protein due to low albumin.  Appreciate cardiology consult and management.   >>LE edema: Differentials include CHF related, renal etiology, as albumin is 3.0.   >> Insulin -dependent diabetes mellitus type 2: Sliding scale insulin  coverage per Endo tool.   >> Essential hypertension: Vitals:   10/08/24 1040 10/08/24 1230 10/08/24 1530 10/08/24 1557  BP: (!) 150/87 138/80 (!) 129/102 (!) 154/98  PTA meds include Coreg  25 mg, hydralazine  50, torsemide  20, Aldactone , valsartan  320. We will hold to allow room for diuresis.    >> History of CVA: Current baseline patient is nonfocal with no deficit.   >> Anemia: Chronic mild anemia as far back as 2018.  Hemoglobin is currently stable suspect from anemia of chronic disease.  Due to her history of positive fecal occult, will check stool occult/ anemia panel and IV PPI.   >> Hyperlipidemia: Continue patient on rosuvastatin .   >> Tobacco abuse: Nicotine  patch.   DVT prophylaxis:  Heparin .  Consults:  Cardiology.  Advance Care Planning:    Code Status: Full Code   Family Communication:  None.  Disposition Plan:  Home.  Severity of Illness: The appropriate patient status for this patient is INPATIENT. Inpatient status is judged to be reasonable and necessary in order to provide the required intensity of service to ensure the patient's safety. The patient's presenting symptoms, physical exam findings, and initial radiographic and laboratory data in the context of their chronic comorbidities is felt to place them at high risk for further  clinical deterioration. Furthermore, it is not anticipated that the patient will be medically stable for discharge from the hospital within 2 midnights of admission.   * I certify that at the point of admission it is my clinical judgment that the patient will require inpatient hospital care spanning beyond 2 midnights from the point of admission due to high intensity of service, high risk for further deterioration and high frequency of surveillance required.*  Unresulted Labs (From admission, onward)     Start     Ordered   10/09/24 0500  CBC  Tomorrow morning,   R        10/08/24 1739   10/09/24 0500  Comprehensive metabolic panel  Tomorrow morning,   R        10/08/24 1739   10/09/24 0500  Magnesium   Tomorrow morning,   R        10/08/24 1739   10/09/24 0500  Occult blood card to lab, stool  Daily,   R      10/08/24 1805   10/08/24 1830  TSH  Once,   R        10/08/24 1830   10/08/24 1805  Type and screen  Once,   R        10/08/24 1805   10/08/24 1805  T4, free  Add-on,   AD        10/08/24 1805   10/08/24 1739  CBC  (heparin )  Once,   R       Comments: Baseline for heparin  therapy IF NOT ALREADY DRAWN.  Notify MD if PLT < 100 K.    10/08/24 1739   10/08/24 1736  D-dimer, quantitative  ONCE - STAT,   STAT        10/08/24 1739   10/08/24 1730  Urinalysis, Routine w reflex microscopic -Urine, Random  Add-on,   AD       Comments: Check for protein due to low albumin.   Question:  Specimen Source  Answer:  Urine, Random   10/08/24 1730   10/08/24 1625  Magnesium   Once,   STAT        10/08/24 1625            Meds ordered this encounter  Medications   insulin  aspart (novoLOG ) injection 10 Units   furosemide  (LASIX ) injection 40 mg   potassium chloride  SA (KLOR-CON  M) CR tablet 20 mEq   aspirin  chewable tablet 81 mg   DISCONTD: furosemide  (LASIX ) injection 40 mg    8 hours from the ed dose please schedule.   rosuvastatin  (CRESTOR ) tablet 20 mg   hydrALAZINE  (APRESOLINE )  injection 10 mg   insulin  aspart (novoLOG ) injection 0-15 Units    Correction coverage::   Moderate (average weight, post-op)    CBG < 70::   Implement Hypoglycemia Standing Orders and refer to Hypoglycemia Standing Orders sidebar report    CBG 70 - 120::   0 units    CBG 121 - 150::   2 units    CBG 151 - 200::   3 units    CBG 201 - 250::   5 units    CBG 251 - 300::   8 units    CBG 301 - 350::   11 units    CBG 351 - 400::   15 units    CBG > 400:   call MD and obtain STAT lab verification   insulin  aspart (novoLOG ) injection 0-5 Units    Correction coverage::   HS scale    CBG < 70::   Implement Hypoglycemia Standing Orders and refer to Hypoglycemia Standing Orders sidebar report    CBG 70 - 120::   0 units    CBG 121 - 150::   0 units    CBG 151 - 200::   0 units    CBG 201 - 250::   2 units    CBG 251 - 300::   3 units    CBG 301 - 350::   4 units    CBG 351 - 400::   5 units    CBG > 400:   call MD and obtain STAT lab verification  sodium chloride  flush (NS) 0.9 % injection 3 mL   OR Linked Order Group    acetaminophen  (TYLENOL ) tablet 650 mg    acetaminophen  (TYLENOL ) suppository 650 mg   nicotine  (NICODERM CQ  - dosed in mg/24 hours) patch 14 mg   heparin  injection 5,000 Units   sodium chloride  flush (NS) 0.9 % injection 3 mL   sodium chloride  flush (NS) 0.9 % injection 3 mL   0.9 %  sodium chloride  infusion   potassium chloride  SA (KLOR-CON  M) CR tablet 40 mEq   furosemide  (LASIX ) injection 80 mg     Orders Placed This Encounter  Procedures   Resp panel by RT-PCR (RSV, Flu A&B, Covid) Anterior Nasal Swab   Group A Strep by PCR   CT Head Wo Contrast   DG Chest Portable 1 View   MR BRAIN WO CONTRAST   CBC with Differential   Comprehensive metabolic panel   Beta-hydroxybutyric acid   Pro Brain natriuretic peptide   Urinalysis, Routine w reflex microscopic -Urine, Clean Catch   Lipase, blood   hCG, serum, qualitative   Magnesium    Urinalysis, Routine w reflex  microscopic -Urine, Random   D-dimer, quantitative   CBC   CBC   Comprehensive metabolic panel   Magnesium    Occult blood card to lab, stool   T4, free   TSH   Diet heart healthy/carb modified Room service appropriate? Yes; Fluid consistency: Thin; Fluid restriction: 1500 mL Fluid   ED Cardiac monitoring   Height and weight   Strict intake and output   Initiate Carrier Fluid Protocol   If O2 sat <94% Administer O2 @ 2 Liters/Minute If O2 sat < 94% administer O2 at 2 liters/minute via nasal canula   Orthostatic vital signs   Cardiac Monitoring Continuous x 48 hours Indications for use: Other; Other indications for use: CHF   Notify physician (specify)   Initiate Heart Failure Care Plan   Daily weights   Strict intake and output   In and Out Cath   Patient Education:   Apply Heart Failure Care Plan   [facility] and [facility] only) Obtain REDS clips reading Every morning   Apply Diabetes Mellitus Care Plan   STAT CBG when hypoglycemia is suspected. If treated, recheck every 15 minutes after each treatment until CBG >/= 70 mg/dl   Refer to Hypoglycemia Protocol Sidebar Report for treatment of CBG < 70 mg/dl   Patient has an active order for admit to inpatient/place in observation   Maintain IV access   Vital signs   Notify physician (specify)   Mobility Protocol: No Restrictions   Refer to Sidebar Report Mobility Protocol for Adult Inpatient   Initiate Adult Central Line Maintenance and Catheter Clearance Protocol for patients with central line (CVC, PICC, Port, Hemodialysis, Trialysis)   If patient diabetic or glucose greater than 140 notify physician for Sliding Scale Insulin  Orders   Initiate CHG Protocol for patients in ICU/SD or any patient with a central line or foley catheter   Do not place and if present remove PureWick   Initiate Oral Care Protocol   Initiate Carrier Fluid Protocol   RN may order General Admission PRN Orders utilizing General Admission PRN medications (through  manage orders) for the following patient needs: allergy symptoms (Claritin), cold sores (Carmex), cough (Robitussin DM), eye irritation (Liquifilm Tears), hemorrhoids (Tucks), indigestion (Maalox), minor skin irritation (Hydrocortisone  Cream), muscle pain (Ben Gay), nose irritation (saline nasal spray) and sore throat (Chloraseptic spray).   Cardiac Monitoring -  Continuous Indefinite   Full code   Inpatient consult to Cardiology   Inpatient consult to Cardiology   Consult to hospitalist   Inpatient consult to Cardiology Consult Timeframe: ROUTINE - requires response within 24 hours; Reason for Consult? CHF Already called   Consult to Transition of Care Team   Consult to Heart Failure Navigation Team [facility], [facility], and [facility])   Nutritional services consult   ED Pulse oximetry, continuous   Pulse oximetry check with vital signs   Oxygen therapy Mode or (Route): Nasal cannula; Liters Per Minute: 2; Keep O2 saturation between: greater than 92 %   Incentive spirometry   POC CBG, ED   I-Stat venous blood gas, ([facility] ED, [facility], [facility])   I-stat chem 8, ED (not at [facility], [facility] or [facility])   POC CBG, ED   ED EKG   Type and screen   Insert peripheral IV   Insert peripheral IV   Admit to Inpatient (patient's expected length of stay will be greater than 2 midnights or inpatient only procedure)    Author: Mario LULLA Blanch, MD 12 pm- 8 pm. [facility]. 10/08/2024 7:33 PM Please note for any communication after hours contact [facility] Assigned provider on call on Amion.       [1]  Allergies Allergen Reactions   Prochlorperazine  Anxiety    Panic attacks   Contrast Media [Iodinated Contrast Media] Hives and Swelling   "

## 2024-10-08 NOTE — Consult Note (Addendum)
 "  Cardiology Consultation   Patient ID: Carolyn Zimmerman MRN: [id number]; DOB: 1986/04/21  Admit date: 10/08/2024 Date of Consult: 10/08/2024  PCP:  Leonarda Roxan BROCKS, NP   [facility] Providers Cardiologist:  Kardie Tobb, DO        Patient Profile: Carolyn Zimmerman is a 39 y.o. female with a hx of uncontrolled HTN, history of CVA in October 2023, HFmrEF, HLD type 2 diabetes mellitus, history of tobacco use who is being seen 10/08/2024 for the evaluation of heart failure at the request of Dr. Mario Blanch.  History of Present Illness: [name] has medical history as stated above. She follows closely with outpatient cardiologist Dr. Sheena. Seen on 10/01/2024 for increasing lower extremity edema. At that time she was started on Furoscix  10 mg/mL x 3 days followed by torsemide  40 mg once daily on M/W/F and 40 mg BID T/Th/S with potassium supplements and follow-up in 1 week. Per office note today, patient with history of chest discomfort that has improved, had plan for outpatient cardiac catheterization after coronary CTA was found to be inconclusive.  Presented via ambulance from follow-up cardiologist office visit this morning where she had a presyncopal event as she was leaving the office. States that she had lightheadedness upon standing from seated position, denies LOC or fall. Cardiologist Dr. Sheena recommended that patient present to ED for further workup due to concern for volume overload/ADHF as well as history of multiple strokes. Found to be hyperglycemic with BG 578 per EMS. Patient is an insulin -dependent type 2 diabetic but has not been taking her insulin  for the past week due to not feeling well.  Complains of subjective fevers at home, sore throat, abdominal pain and nausea.  Relevant ED workup: proBNP 783. Hs-troponins 27 >> 26. EKG showed sinus rhythm, rate 82 bpm. CMP with mild hyponatremia 131, hypochloremia 95, hypocalcemia 8.3, hypoalbuminemia 3.0. Unremarkable CBC. Respiratory panel  negative. CXR with low lung volumes without acute/active cardiopulmonary disease. CT head without acute intracranial pathology.  Most recent echo 07/17/24 showed LVEF 60-65%, no RWMA, no LVH, RV systolic function, mild LAE, small circumferential pericardial effusion with no evidence of cardiac tamponade  Upon speaking to the patient, she states that the presyncopal episode she experienced at the cardiology office is not unusual for her. She endorses occasional instances of dizziness that quickly self-resolve. Denies any history of falls or syncope. She notes that her main complaint is the swelling in her legs for the past 3 weeks. States that it has not improved despite daily torsemide  and recently increased diuretic regimen including Furoscix  and increased torsemide  (see details above) since Friday 1/16. States that she has had intermittent chest pain that persists with rest for the past several months, has been following with Dr. Sheena for outpatient ischemic evaluation. Also reports dyspnea on exertion but states that this is baseline for her. She feels somewhat better after IV Lasix  40 mg in the ED. No chest pain or dyspnea at this time.    Past Medical History:  Diagnosis Date   Abscess of chest wall 06/15/2017   Cellulitis    Coronavirus infection 05/02/2020   CVA (cerebral vascular accident) (HCC) 07/14/2022   L MCA CVA, watershed infarct   Diabetes mellitus    Heart failure with mildly reduced ejection fraction (HFmrEF) (HCC) 09/29/2021   EF 45%, moderate LVH   Hyperlipidemia associated with type 2 diabetes mellitus (HCC)    Hypertension    Jaundice    Obesity    TOA (  tubo-ovarian abscess) 09/2019    Past Surgical History:  Procedure Laterality Date   CHOLECYSTECTOMY     IRRIGATION AND DEBRIDEMENT ABSCESS N/A 05/02/2020   Procedure: IRRIGATION AND DEBRIDEMENT ABDOMINAL and CHEST WALL NECROTIZING FASCITITS;  Surgeon: Sheldon Standing, MD;  Location: [facility];  Service: General;   Laterality: N/A;   liver stent         Scheduled Meds:  [START ON 10/09/2024] aspirin   81 mg Oral Daily   furosemide   80 mg Intravenous BID   heparin   5,000 Units Subcutaneous Q8H   [START ON 10/09/2024] insulin  aspart  0-15 Units Subcutaneous TID WC   insulin  aspart  0-5 Units Subcutaneous QHS   nicotine   14 mg Transdermal Daily   potassium chloride   40 mEq Oral Once   [START ON 10/09/2024] rosuvastatin   20 mg Oral Daily   sodium chloride  flush  3 mL Intravenous Q12H   sodium chloride  flush  3 mL Intravenous Q12H   Continuous Infusions:  sodium chloride      PRN Meds: sodium chloride , acetaminophen  **OR** acetaminophen , hydrALAZINE , sodium chloride  flush  Allergies:   Allergies[1]  Social History:   Social History   Socioeconomic History   Marital status: Single    Spouse name: Not on file   Number of children: Not on file   Years of education: Not on file   Highest education level: GED or equivalent  Occupational History   Not on file  Tobacco Use   Smoking status: Former    Current packs/day: 0.00    Types: Cigarettes    Quit date: 10/08/2023    Years since quitting: 1.0   Smokeless tobacco: Never  Vaping Use   Vaping status: Former  Substance and Sexual Activity   Alcohol use: Not Currently    Comment: occ   Drug use: No   Sexual activity: Not on file  Other Topics Concern   Not on file  Social History Narrative   Are you right handed or left handed? right   Are you currently employed ? yes   What is your current occupation?[employer]   Do you live at home alone?with kids   Who lives with you?    What type of home do you live in: 1 story or 2 story? two    Caffeine 1-3 a day   Social Drivers of Health   Tobacco Use: Medium Risk (10/08/2024)   Patient History    Smoking Tobacco Use: Former    Smokeless Tobacco Use: Never    Passive Exposure: Not on file  Financial Resource Strain: Medium Risk (05/11/2024)   Overall Financial Resource Strain (CARDIA)     Difficulty of Paying Living Expenses: Somewhat hard  Food Insecurity: No Food Insecurity (07/23/2024)   Epic    Worried About Programme Researcher, Broadcasting/film/video in the Last Year: Never true    Ran Out of Food in the Last Year: Never true  Transportation Needs: No Transportation Needs (07/23/2024)   Epic    Lack of Transportation (Medical): No    Lack of Transportation (Non-Medical): No  Physical Activity: Insufficiently Active (05/11/2024)   Exercise Vital Sign    Days of Exercise per Week: 1 day    Minutes of Exercise per Session: 10 min  Stress: Stress Concern Present (05/11/2024)   [employer] of Occupational Health - Occupational Stress Questionnaire    Feeling of Stress: To some extent  Social Connections: Socially Isolated (05/11/2024)   Social Connection and Isolation Panel    Frequency of  Communication with Friends and Family: Once a week    Frequency of Social Gatherings with Friends and Family: Once a week    Attends Religious Services: 1 to 4 times per year    Active Member of [employer] or Organizations: No    Attends Engineer, Structural: Not on file    Marital Status: Separated  Intimate Partner Violence: Not At Risk (07/23/2024)   Epic    Fear of Current or Ex-Partner: No    Emotionally Abused: No    Physically Abused: No    Sexually Abused: No  Depression (PHQ2-9): Low Risk (07/25/2024)   Depression (PHQ2-9)    PHQ-2 Score: 0  Alcohol Screen: Low Risk (05/11/2024)   Alcohol Screen    Last Alcohol Screening Score (AUDIT): 0  Housing: Unknown (07/23/2024)   Epic    Unable to Pay for Housing in the Last Year: No    Number of Times Moved in the Last Year: Not on file    Homeless in the Last Year: No  Recent Concern: Housing - High Risk (05/11/2024)   Epic    Unable to Pay for Housing in the Last Year: Yes    Number of Times Moved in the Last Year: 1    Homeless in the Last Year: No  Utilities: Not At Risk (07/23/2024)   Epic    Threatened with loss of utilities: No   Health Literacy: Not on file    Family History:    Family History  Problem Relation Age of Onset   Hypertension Mother    Diabetes Father    Hypertension Father      ROS:  Please see the history of present illness.   All other ROS reviewed and negative.     Physical Exam/Data: Vitals:   10/08/24 1500 10/08/24 1530 10/08/24 1556 10/08/24 1557  BP:  (!) 129/102  (!) 154/98  Pulse:  77  74  Resp: 12 19  18   Temp:   98.3 F (36.8 C)   TempSrc:   Oral   SpO2:  98%  100%  Weight:      Height:       No intake or output data in the 24 hours ending 10/08/24 1939    10/08/2024   10:43 AM 10/07/2024    8:44 AM 10/01/2024    8:16 AM  Last 3 Weights  Weight (lbs) 286 lb 7.8 oz 286 lb 8 oz 291 lb 6.4 oz  Weight (kg) 129.95 kg 129.956 kg 132.178 kg     Body mass index is 44.87 kg/m.  General: Well nourished, well developed, in no acute distress Neck: Unable to assess due to body habitus Vascular: Distal pulses 2+ bilaterally Cardiac: Distant heart sounds; RRR; no murmur Lungs: Clear to auscultation bilaterally, no wheezing, rhonchi or rales  Abd: Soft, nontender Ext: 2+ pitting edema bilaterally Musculoskeletal: No deformities Skin: Warm and dry  Neuro: No focal abnormalities noted Psych: Normal affect   EKG: The EKG was personally reviewed and demonstrates: Sinus rhythm, rate 82 bpm, no acute ischemic changes Telemetry: Telemetry was personally reviewed and demonstrates: Not currently on telemetry  Relevant CV Studies:  Echo [07/17/24]: 1. Left ventricular ejection fraction, by estimation, is 60 to 65% . The left ventricle has normal function. The left ventricle has no regional wall motion abnormalities. The left ventricular internal cavity size was severely dilated. There is mild concentric left ventricular hypertrophy. Left ventricular diastolic parameters were normal. 2. Right ventricular systolic function is normal. The  right ventricular size is normal. Tricuspid  regurgitation signal is inadequate for assessing PA pressure. 3. Left atrial size was mildly dilated. 4. The mitral valve is normal in structure. Trivial mitral valve regurgitation. No evidence of mitral stenosis. 5. The aortic valve is normal in structure. Aortic valve regurgitation is not visualized. No aortic stenosis is present. 6. The inferior vena cava is normal in size with greater than 50% respiratory variability, suggesting right atrial pressure of 3 mmHg. 7. A small pericardial effusion is present. The pericardial effusion is circumferential. There is no evidence of cardiac tamponade.  CT chest [01/08/24]: 1. No acute or unexpected extracardiac findings. 2. Minimal pericardial effusion is unchanged from prior chest CT.  Coronary CTA [01/08/24]: 1. Nondiagnostic study, CADRADS=N. Limited interpretation of small caliber vessels given signal to noise artifact. Small caliber/course RCA suggests that there should be left dominant system; however, left circumflex not seen. There is a small stump at typical location of LCx ostium. 2. Coronary calcium  score of 44.2. This was 36th percentile for age-, sex, and race-matched controls. 3. Borderline dilated coronary sinus. Field of view does not allow for exclusion of persistent SVC. No PFO/ASD clearly visualized but cannot be excluded on this study alone. Grossly normal pulmonary venous drainage, though limited due to signal to noise artifact, cannot exclude small anomalous pulmonary vein drainage. 4.  Moderately thickened pericardium. 5.  Dilated pulmonary artery (37 mm). Significant signal to noise artifact overall limits interpretation. RECOMMENDATIONS: CAD-RADS N: Non-diagnostic study. Obstructive CAD can't be excluded. Alternative evaluation is recommended.    Laboratory Data: High Sensitivity Troponin:  No results for input(s): TROPONINIHS in the last 720 hours.  Recent Labs  Lab 10/08/24 1155 10/08/24 1245  TRNPT 27* 26*       Chemistry Recent Labs  Lab 10/08/24 1155 10/08/24 1258 10/08/24 1259  NA 131* 133* 133*  K 3.7 3.4* 3.4*  CL 95* 97*  --   CO2 25  --   --   GLUCOSE 474* 473*  --   BUN 13 14  --   CREATININE 0.78 0.80  --   CALCIUM  8.3*  --   --   GFRNONAA >60  --   --   ANIONGAP 11  --   --     Recent Labs  Lab 10/08/24 1155  PROT 7.1  ALBUMIN 3.0*  AST 27  ALT 19  ALKPHOS 135*  BILITOT 0.2   Lipids No results for input(s): CHOL, TRIG, HDL, LABVLDL, LDLCALC, CHOLHDL in the last 168 hours.  Hematology Recent Labs  Lab 10/08/24 1155 10/08/24 1258 10/08/24 1259  WBC 7.5  --   --   RBC 4.34  --   --   HGB 11.9* 12.6 12.2  HCT 35.7* 37.0 36.0  MCV 82.3  --   --   MCH 27.4  --   --   MCHC 33.3  --   --   RDW 12.8  --   --   PLT 315  --   --    Thyroid  No results for input(s): TSH, FREET4 in the last 168 hours.  BNP Recent Labs  Lab 10/08/24 1155  PROBNP 783.0*    DDimer No results for input(s): DDIMER in the last 168 hours.  Radiology/Studies:  CT Head Wo Contrast Result Date: 10/08/2024 CLINICAL DATA:  Near syncopal episode. EXAM: CT HEAD WITHOUT CONTRAST TECHNIQUE: Contiguous axial images were obtained from the base of the skull through the vertex without intravenous contrast. RADIATION DOSE REDUCTION: This exam  was performed according to the departmental dose-optimization program which includes automated exposure control, adjustment of the mA and/or kV according to patient size and/or use of iterative reconstruction technique. COMPARISON:  01/30/2023 FINDINGS: Brain: No evidence of acute infarction, hemorrhage, hydrocephalus, extra-axial collection or mass lesion/mass effect. Vascular: No hyperdense vessel or unexpected calcification. Skull: Normal. Negative for fracture or focal lesion. Sinuses/Orbits: No acute finding. Other: None. IMPRESSION: No acute intracranial pathology. Electronically Signed   By: Toribio Agreste M.D.   On: 10/08/2024 15:39   DG Chest  Portable 1 View Result Date: 10/08/2024 CLINICAL DATA:  Hyperglycemia with possible CHF exacerbation. EXAM: PORTABLE CHEST 1 VIEW COMPARISON:  July 15, 2024 FINDINGS: The cardiac silhouette is mildly enlarged and unchanged in size. Low lung volumes are noted. No acute infiltrate, pleural effusion or pneumothorax is identified. The visualized skeletal structures are unremarkable. IMPRESSION: Low lung volumes without acute or active cardiopulmonary disease. Electronically Signed   By: Suzen Dials M.D.   On: 10/08/2024 13:33     Assessment and Plan:  Acute on chronic HFrEF Presents with ongoing lower extremity swelling for the past 3 weeks despite recently increased diuretic regimen including Furoscix  x 3 days then torsemide  40 mg BID on Monday 1/19 with good UOP proBNP 783 CXR with low lung volumes, otherwise no pulmonary edema or pleural effusion Most recent echo 07/17/24 showed LVEF 60-65%, mild LVH, normal RV systolic function, small circumferential pericardial effusion with no evidence of cardiac tamponade Home meds: carvedilol  25 mg BID, spironolactone  50 mg daily, valsartan  320 mg daily, torsemide  20 mg daily (however, patient has not been taking her medications for the past few weeks due to cost) S/p IV Lasix  40 mg in the ED, still appears volume overloaded on exam with 2+ pitting edema bilaterally Start IV Lasix  80 mg BID Start irbesartan  300 mg daily and home spironolactone  50 mg daily Continue strict I/Os, daily BMPs, daily weights Echo ordered   Chest pain Complains of intermittent mid-chest pain accompanied by dyspnea that persists despite rest for the past 4 months. No recent change in character, severity, or frequency at this time Low/flat hs-troponins 27 >> 26 EKG showed sinus rhythm, no acute ischemic changes Has been following closely with outpatient cardiologist Dr. Sheena. Coronary CTA 06/17/24 inconclusive due to significant signal-to-noise artifact. No history of  ischemic evaluation otherwise. Given patient's high risk profile for CAD secondary to uncontrolled hypertension, hyperlipidemia, poorly controlled type 2 diabetes, and history of stroke, will plan for cardiac catheterization once closer to euvolemia  Hypertension Uncontrolled despite 4 antihypertensive medications (valsartan  320 mg daily, carvedilol  25 mg BID, hydralazine  50 mg TID, spironolactone  50 mg daily) Renal artery duplex in 2023 showed no evidence of renal artery stenosis. Sleep apnea testing was negative. Recent consult with Dr. Gatha Cage evaluation of renal denervation with plans to proceed with procedure Elevated BPs at OV this morning and in the ED Start irbesartan  300 mg daily and home spironolactone  50 mg daily. CTM, if remains elevated plan to resume home hydralazine   Hyperlipidemia Most recent lipid panel 10/29/23 with HDL 40, indeterminate LDL due to triglycerides >400 Continue home rosuvastatin  20 mg daily  Hypokalemia On potassium chloride  20 mEq BID at home Replaced in the ED Will give an additional Kcl 40 mEq x1 with aggressive diuresis  Per primary Type 2 diabetes mellitus History of CVA   Risk Assessment/Risk Scores:      [place]  Heart Association (NYHA) Functional Class NYHA Class III   For questions or updates,  please contact [facility] Please consult [email or web address] for contact info under      Signed, Owen MARLA Daniels, PA-C  10/08/2024 7:39 PM   ATTENDING ATTESTATION:  After conducting a review of all available clinical information with the care team, interviewing the patient, and performing a physical exam, I agree with the findings and plan described in this note with adjustments as indicated below which were discussed and enacted by staff above.   GEN: No acute distress, AO x 3 HEENT:  MMM, unable to assess JVP due to neck girth Cardiac: RRR, no murmurs, rubs, or gallops. Respiratory: Fairly clear without rhonchi or rales GI: Soft,  nontender, non-distended MS: +1 edema; No deformity. Neuro:  Nonfocal  Vasc:  +2 radial pulses   The patient is a 39 year old female with a history of uncontrolled hypertension, history of stroke in October 2023, chronic diastolic heart failure, hyperlipidemia, and type 2 diabetes who is being seen for acute on chronic diastolic heart failure.  The patient has been followed by Dr. Sheena for some time.  She has been seen over the last several weeks due to worsening heart failure.  Her weight is increased by about 20 pounds she tells me.  She saw Dr. Sheena today after a syncopal episode.  In the emergency department she was found to be hyperglycemic.  Her proBNP was 783.  Her troponins are mildly elevated.  EKG demonstrated no acute ischemic changes.  Head CT was negative.  The patient tells me that she has problems laying flat in bed.  She is noticed about a 20 pound weight gain.  She endorses peripheral edema and early satiety at times.  She occasionally gets chest discomfort with and without exertion.  Due to financial issues she has a hard time taking all of her medications on a regular basis.  The patient needs aggressive diuresis.  When I tried to lay the patient flat she was unable to comply due to breathing difficulties.  Strict I's and O's, increase lasix  to 80mg  IV BID with a dose now.  Will replete K as well.  Restart irbesartan  300mg , spironolactone  50mg .  Follow BMP, Mg.  Once patient has been rendered more euvolemic, will need to consider coronary angiography as outpatient coronary CTA was nondiagnostic.    Soren Lazarz, MD Pager [phone]     [1]  Allergies Allergen Reactions   Prochlorperazine  Anxiety    Panic attacks   Contrast Media [Iodinated Contrast Media] Hives and Swelling   "

## 2024-10-09 ENCOUNTER — Inpatient Hospital Stay (HOSPITAL_COMMUNITY)

## 2024-10-09 ENCOUNTER — Other Ambulatory Visit (HOSPITAL_COMMUNITY): Payer: Self-pay

## 2024-10-09 ENCOUNTER — Telehealth (HOSPITAL_COMMUNITY): Payer: Self-pay

## 2024-10-09 DIAGNOSIS — I5031 Acute diastolic (congestive) heart failure: Secondary | ICD-10-CM

## 2024-10-09 LAB — ECHOCARDIOGRAM COMPLETE
AR max vel: 1.94 cm2
AV Area VTI: 1.93 cm2
AV Area mean vel: 1.85 cm2
AV Mean grad: 5.5 mmHg
AV Peak grad: 10.7 mmHg
Ao pk vel: 1.64 m/s
Area-P 1/2: 3.31 cm2
Height: 67 in
S' Lateral: 3.4 cm
Weight: 4440 [oz_av]

## 2024-10-09 LAB — GLUCOSE, CAPILLARY
Glucose-Capillary: 326 mg/dL — ABNORMAL HIGH (ref 70–99)
Glucose-Capillary: 309 mg/dL — ABNORMAL HIGH (ref 70–99)
Glucose-Capillary: 182 mg/dL — ABNORMAL HIGH (ref 70–99)
Glucose-Capillary: 210 mg/dL — ABNORMAL HIGH (ref 70–99)

## 2024-10-09 LAB — CBC
HCT: 37 % (ref 36.0–46.0)
Hemoglobin: 12.1 g/dL (ref 12.0–15.0)
MCH: 27 pg (ref 26.0–34.0)
MCHC: 32.7 g/dL (ref 30.0–36.0)
MCV: 82.6 fL (ref 80.0–100.0)
Platelets: 337 K/uL (ref 150–400)
RBC: 4.48 MIL/uL (ref 3.87–5.11)
RDW: 13.1 % (ref 11.5–15.5)
WBC: 7.4 K/uL (ref 4.0–10.5)
nRBC: 0 % (ref 0.0–0.2)

## 2024-10-09 LAB — COMPREHENSIVE METABOLIC PANEL WITH GFR
ALT: 18 U/L (ref 0–44)
AST: 20 U/L (ref 15–41)
Albumin: 2.9 g/dL — ABNORMAL LOW (ref 3.5–5.0)
Alkaline Phosphatase: 97 U/L (ref 38–126)
Anion gap: 12 (ref 5–15)
BUN: 13 mg/dL (ref 6–20)
CO2: 24 mmol/L (ref 22–32)
Calcium: 8.4 mg/dL — ABNORMAL LOW (ref 8.9–10.3)
Chloride: 100 mmol/L (ref 98–111)
Creatinine, Ser: 0.84 mg/dL (ref 0.44–1.00)
GFR, Estimated: >60 mL/min
Glucose, Bld: 292 mg/dL — ABNORMAL HIGH (ref 70–99)
Potassium: 3.6 mmol/L (ref 3.5–5.1)
Sodium: 136 mmol/L (ref 135–145)
Total Bilirubin: 0.2 mg/dL (ref 0.0–1.2)
Total Protein: 6.8 g/dL (ref 6.5–8.1)

## 2024-10-09 LAB — CBG MONITORING, ED: Glucose-Capillary: 278 mg/dL — ABNORMAL HIGH (ref 70–99)

## 2024-10-09 LAB — MAGNESIUM: Magnesium: 1.8 mg/dL (ref 1.7–2.4)

## 2024-10-09 MED ORDER — PANTOPRAZOLE SODIUM 40 MG PO TBEC
40.0000 mg | DELAYED_RELEASE_TABLET | Freq: Every day | ORAL | Status: DC
  Administered 2024-10-11: 40 mg via ORAL
  Filled 2024-10-09 (×2): qty 1

## 2024-10-09 MED ORDER — FREE WATER: 250.0000 mL | Freq: Once | Status: DC

## 2024-10-09 MED ORDER — INSULIN ASPART 100 UNIT/ML IJ SOLN
0.0000 [IU] | Freq: Three times a day (TID) | INTRAMUSCULAR | Status: DC
  Administered 2024-10-09: 3 [IU] via SUBCUTANEOUS
  Administered 2024-10-09: 11 [IU] via SUBCUTANEOUS
  Administered 2024-10-10 – 2024-10-11 (×2): 15 [IU] via SUBCUTANEOUS
  Administered 2024-10-11: 8 [IU] via SUBCUTANEOUS
  Filled 2024-10-09: qty 3
  Filled 2024-10-09: qty 15
  Filled 2024-10-09: qty 8
  Filled 2024-10-09: qty 11

## 2024-10-09 MED ORDER — MENTHOL 3 MG MT LOZG: 1.0000 | LOZENGE | OROMUCOSAL | Status: DC | PRN

## 2024-10-09 MED ORDER — INSULIN GLARGINE-YFGN 100 UNIT/ML ~~LOC~~ SOLN: 25.0000 [IU] | Freq: Two times a day (BID) | SUBCUTANEOUS | Status: DC

## 2024-10-09 MED ORDER — INSULIN ASPART 100 UNIT/ML IJ SOLN
5.0000 [IU] | Freq: Three times a day (TID) | INTRAMUSCULAR | Status: DC
  Administered 2024-10-09 – 2024-10-11 (×5): 5 [IU] via SUBCUTANEOUS
  Filled 2024-10-09 (×4): qty 5

## 2024-10-09 MED ORDER — PHENOL 1.4 % MT LIQD
1.0000 | OROMUCOSAL | Status: DC | PRN
  Filled 2024-10-09: qty 177

## 2024-10-09 MED ORDER — GABAPENTIN 300 MG PO CAPS
300.0000 mg | ORAL_CAPSULE | Freq: Two times a day (BID) | ORAL | Status: DC
  Administered 2024-10-09 – 2024-10-11 (×4): 300 mg via ORAL
  Filled 2024-10-09 (×4): qty 1

## 2024-10-09 MED ORDER — INSULIN ASPART 100 UNIT/ML IJ SOLN
0.0000 [IU] | Freq: Every day | INTRAMUSCULAR | Status: AC
  Administered 2024-10-09: 2 [IU] via SUBCUTANEOUS
  Administered 2024-10-10: 5 [IU] via SUBCUTANEOUS
  Filled 2024-10-09: qty 5
  Filled 2024-10-09: qty 2

## 2024-10-09 MED ORDER — INSULIN GLARGINE 100 UNIT/ML ~~LOC~~ SOLN
25.0000 [IU] | Freq: Two times a day (BID) | SUBCUTANEOUS | Status: DC
  Administered 2024-10-09 (×2): 25 [IU] via SUBCUTANEOUS
  Filled 2024-10-09 (×4): qty 0.25

## 2024-10-09 NOTE — Progress Notes (Signed)
 Initial Nutrition Assessment  DOCUMENTATION CODES:   Obesity unspecified  INTERVENTION:  Encourage po intake on heart healthy carb modified diet with 1.5L fluid restriction per MD Provided handouts related to heart healthy diet and low sodium diet in AVS Provided diabetes nutrition education handout in AVS  NUTRITION DIAGNOSIS:   Altered nutrition lab value related to chronic illness as evidenced by  (A1C of 13.5 and CBGs of 182-578 mg/dL in the last 24 hours).  GOAL:   Patient will meet greater than or equal to 90% of their needs  MONITOR:   PO intake, Labs  REASON FOR ASSESSMENT:   Consult Other (Comment) (Nutritional goals)  ASSESSMENT:   39 y.o. female with PMH significant for morbid obesity, DM2, HTN, HLD, CVA, CHF, h/o smoking. Found to be in acute exacerbation of chronic CHF and hyperglycemic.  Pt resting on assessment appears fluid overloaded, nutritional assessment was cut short as pt on phone with her mother. Pt stated she wanted to go home.  Pt denies recent weight loss or loss of appetite. Stated dry weight of 265 lbs. Pt has noticed she has been gaining fluid in the past couple of months. Pt reports she eats a balanced diet and makes home cooked meals for herself and her kids. Typically has eggs for breakfast, skips lunch, and then will eat a protein with a vegetable and a starch for dinner. Drinks diet tea and soda. Will snack on vegetables such as cucumbers but avoids fruit as they contain too much sugar. Pt reports when it is closer to her period she will binge on candy and desserts. Denies excessive salt and sugar intake.   Encouraged heart healthy diet, including limiting salt. Provided examples on ways to decrease sodium intake in diet. Discouraged intake of processed foods and use of salt shaker. discussed why it is important for patient to adhere to diet recommendations, and emphasized the role of fluids, foods to avoid, and importance of weighing self daily.  Teach back method used.  CBG's elevated since admission ranging from 182-578 mg/dL over the last 24 hours. Pt reports she does not take insulin  or her other medications regularly as she is not able to work and has no insurance. Pt applied for disability but is pending a decision. Discussed importance of controlled and consistent carbohydrate intake throughout the day. Provided examples of ways to balance meals/snacks and encouraged intake of high-fiber, whole grain complex carbohydrates. Discussed different food groups and their effects on blood sugar, emphasizing carbohydrate-containing foods.   Ongoing IV diuresis, - 4 kg in 24 hours  Admit weight: 129.9 kg Current weight: 125.9 kg  Stated dry weight: 265 lbs  Average Meal Intake: 1/22: 55% intake x 1 recorded meals  Nutritionally Relevant Medications: Scheduled Meds:  aspirin   81 mg Oral Daily   furosemide   80 mg Intravenous BID   gabapentin   300 mg Oral BID   heparin   5,000 Units Subcutaneous Q8H   insulin  aspart  0-15 Units Subcutaneous TID WC   insulin  aspart  0-5 Units Subcutaneous QHS   insulin  aspart  5 Units Subcutaneous TID WC   insulin  glargine  25 Units Subcutaneous BID   Labs Reviewed: CBG ranges from 182-578 mg/dL over the last 24 hours HgbA1c 13.5  NUTRITION - FOCUSED PHYSICAL EXAM:  Flowsheet Row Most Recent Value  Orbital Region No depletion  Upper Arm Region No depletion  Thoracic and Lumbar Region No depletion  Buccal Region No depletion  Temple Region No depletion  Clavicle Bone Region  No depletion  Clavicle and Acromion Bone Region No depletion  Scapular Bone Region No depletion  Dorsal Hand Unable to assess  [Fluid]  Patellar Region Unable to assess  [Fluid]  Anterior Thigh Region Unable to assess  [Fluid]  Posterior Calf Region Unable to assess  [Fluid]  Edema (RD Assessment) Moderate  [Lower extremitites]  Hair Reviewed  Eyes Reviewed  Mouth Reviewed  Skin Reviewed  Nails Reviewed    Diet  Order:   Diet Order             Diet heart healthy/carb modified Room service appropriate? Yes; Fluid consistency: Thin; Fluid restriction: 1500 mL Fluid  Diet effective now                   EDUCATION NEEDS:   Education needs have been addressed  Skin:  Skin Assessment: Reviewed RN Assessment  Last BM:  PTA  Height:   Ht Readings from Last 1 Encounters:  10/08/24 5' 7 (1.702 m)    Weight:   Wt Readings from Last 1 Encounters:  10/09/24 125.9 kg    Ideal Body Weight:  61.4 kg  BMI:  Body mass index is 43.46 kg/m.  Estimated Nutritional Needs:   Kcal:  2000-2200 kcal  Protein:  110-130 gm  Fluid:  1.5L fluid restiction per MD   Olivia Kenning, RD Registered Dietitian  See Amion for more information

## 2024-10-09 NOTE — Telephone Encounter (Signed)
 Pharmacy Patient Advocate Encounter  Insurance verification completed.    The patient is insured through [facility] MEDICAID.     Ran test claim for Lantus  100unit pen and the current 30 day co-pay is $4.  Ran test claim for Generic Novolog  100unit pen and the current 30 day co-pay is $4.  Ran test claim for Humalog  100unit pen and the current 30 day co-pay is $4.  Ran test claim for Dexcom G7 sensors and the current 30 day co-pay is $0.  This test claim was processed through [employer]- copay amounts may vary at other pharmacies due to [employer], or as the patient moves through the different stages of their insurance plan.

## 2024-10-09 NOTE — Progress Notes (Signed)
 "   DAILY PROGRESS NOTE   Patient Name: Carolyn Zimmerman Date of Encounter: 10/09/2024 Cardiologist: Kardie Tobb, DO  Chief Complaint   Edema is improving  Patient Profile   Carolyn Zimmerman is a 39 y.o. female with a hx of uncontrolled HTN, history of CVA in October 2023, HFmrEF, HLD type 2 diabetes mellitus, history of tobacco use who is being seen 10/08/2024 for the evaluation of heart failure at the request of Dr. Mario Blanch   Subjective   Admitted overnight for CHF and hyperglycemia in the setting of being off insulin  due to infective symptoms the past week. Also had c/o chest pain. proBNP was elevated at 783. Started on lasix  80 mg IV BID -  recorded net negative 360 cc overnight.   Objective   Vitals:   10/09/24 0100 10/09/24 0158 10/09/24 0500 10/09/24 0502  BP:  (!) 157/98  (!) 158/86  Pulse: 77 82  76  Resp: 19 18  17   Temp:  (!) 97.5 F (36.4 C)  97.6 F (36.4 C)  TempSrc:  Oral  Oral  SpO2: 100% 100%  100%  Weight:   125.9 kg   Height:        Intake/Output Summary (Last 24 hours) at 10/09/2024 9062 Last data filed at 10/09/2024 0230 Gross per 24 hour  Intake 240 ml  Output 600 ml  Net -360 ml   Filed Weights   10/08/24 1043 10/09/24 0500  Weight: 129.9 kg 125.9 kg    Physical Exam   General appearance: alert and no distress Lungs: clear to auscultation bilaterally Heart: regular rate and rhythm Extremities: edema 1+ bilateral LE Neurologic: Grossly normal  Inpatient Medications    Scheduled Meds:  aspirin   81 mg Oral Daily   furosemide   80 mg Intravenous BID   gabapentin   300 mg Oral BID   heparin   5,000 Units Subcutaneous Q8H   insulin  aspart  0-15 Units Subcutaneous TID WC   insulin  aspart  0-5 Units Subcutaneous QHS   irbesartan   300 mg Oral Daily   nicotine   14 mg Transdermal Daily   rosuvastatin   20 mg Oral Daily   sodium chloride  flush  3 mL Intravenous Q12H   sodium chloride  flush  3 mL Intravenous Q12H   spironolactone   50 mg Oral Daily     Continuous Infusions:  sodium chloride       PRN Meds: sodium chloride , acetaminophen  **OR** acetaminophen , hydrALAZINE , menthol , sodium chloride  flush   Labs   Results for orders placed or performed during the hospital encounter of 10/08/24 (from the past 48 hours)  POC CBG, ED     Status: Abnormal   Collection Time: 10/08/24 10:50 AM  Result Value Ref Range   Glucose-Capillary 578 (HH) 70 - 99 mg/dL    Comment: Glucose reference range applies only to samples taken after fasting for at least 8 hours.   Comment 1 Document in Chart   Urinalysis, Routine w reflex microscopic -Urine, Clean Catch     Status: Abnormal   Collection Time: 10/08/24 11:42 AM  Result Value Ref Range   Color, Urine STRAW (A) YELLOW   APPearance CLEAR CLEAR   Specific Gravity, Urine 1.011 1.005 - 1.030   pH 6.0 5.0 - 8.0   Glucose, UA >=500 (A) NEGATIVE mg/dL   Hgb urine dipstick MODERATE (A) NEGATIVE   Bilirubin Urine NEGATIVE NEGATIVE   Ketones, ur NEGATIVE NEGATIVE mg/dL   Protein, ur 899 (A) NEGATIVE mg/dL   Nitrite NEGATIVE NEGATIVE   Leukocytes,Ua  NEGATIVE NEGATIVE   RBC / HPF 0-5 0 - 5 RBC/hpf   WBC, UA 0-5 0 - 5 WBC/hpf   Bacteria, UA NONE SEEN NONE SEEN   Squamous Epithelial / HPF 0-5 0 - 5 /HPF    Comment: Performed at [facility], [address] [place], [place] [address]  CBC with Differential     Status: Abnormal   Collection Time: 10/08/24 11:55 AM  Result Value Ref Range   WBC 7.5 4.0 - 10.5 K/uL   RBC 4.34 3.87 - 5.11 MIL/uL   Hemoglobin 11.9 (L) 12.0 - 15.0 g/dL   HCT 64.2 (L) 63.9 - 53.9 %   MCV 82.3 80.0 - 100.0 fL   MCH 27.4 26.0 - 34.0 pg   MCHC 33.3 30.0 - 36.0 g/dL   RDW 87.1 88.4 - 84.4 %   Platelets 315 150 - 400 K/uL   nRBC 0.0 0.0 - 0.2 %   Neutrophils Relative % 55 %   Neutro Abs 4.2 1.7 - 7.7 K/uL   Lymphocytes Relative 36 %   Lymphs Abs 2.7 0.7 - 4.0 K/uL   Monocytes Relative 6 %   Monocytes Absolute 0.5 0.1 - 1.0 K/uL   Eosinophils Relative 2 %    Eosinophils Absolute 0.1 0.0 - 0.5 K/uL   Basophils Relative 0 %   Basophils Absolute 0.0 0.0 - 0.1 K/uL   Immature Granulocytes 1 %   Abs Immature Granulocytes 0.04 0.00 - 0.07 K/uL    Comment: Performed at [facility], [address], [place], [place] [address]  Comprehensive metabolic panel     Status: Abnormal   Collection Time: 10/08/24 11:55 AM  Result Value Ref Range   Sodium 131 (L) 135 - 145 mmol/L   Potassium 3.7 3.5 - 5.1 mmol/L    Comment: HEMOLYSIS AT THIS LEVEL MAY AFFECT RESULT   Chloride 95 (L) 98 - 111 mmol/L   CO2 25 22 - 32 mmol/L   Glucose, Bld 474 (H) 70 - 99 mg/dL    Comment: Glucose reference range applies only to samples taken after fasting for at least 8 hours.   BUN 13 6 - 20 mg/dL   Creatinine, Ser 9.21 0.44 - 1.00 mg/dL   Calcium  8.3 (L) 8.9 - 10.3 mg/dL   Total Protein 7.1 6.5 - 8.1 g/dL   Albumin 3.0 (L) 3.5 - 5.0 g/dL   AST 27 15 - 41 U/L    Comment: HEMOLYSIS AT THIS LEVEL MAY AFFECT RESULT   ALT 19 0 - 44 U/L   Alkaline Phosphatase 135 (H) 38 - 126 U/L   Total Bilirubin 0.2 0.0 - 1.2 mg/dL   GFR, Estimated >39 >39 mL/min    Comment: (NOTE) Calculated using the CKD-EPI Creatinine Equation (2021)    Anion gap 11 5 - 15    Comment: Performed at [facility], [address] [place], [place] [address]  Beta-hydroxybutyric acid     Status: None   Collection Time: 10/08/24 11:55 AM  Result Value Ref Range   Beta-Hydroxybutyric Acid 0.12 0.05 - 0.27 mmol/L    Comment: (NOTE) This is a modified FDA-approved test that has been validated and its performance characteristics determined by the reporting laboratory. This laboratory is certified under the Clinical Laboratory Improvement Amendments (CLIA) as qualified to perform high complexity clinical laboratory testing.  Performed at [facility], [address] [place], [place] [address]   Pro Brain natriuretic peptide  Status: Abnormal   Collection Time: 10/08/24 11:55 AM   Result Value Ref Range   Pro Brain Natriuretic Peptide 783.0 (H) <300.0 pg/mL    Comment: (NOTE) Age Group        Cut-Points    Interpretation  < 50 years     450 pg/mL       NT-proBNP > 450 pg/mL indicates                                ADHF is likely              50 to 75 years  900 pg/mL      NT-proBNP > 900 pg/mL indicates          ADHF is likely  > 75 years      1800 pg/mL     NT-proBNP > 1800 pg/mL indicates          ADHF is likely                           All ages    Results between       Indeterminate. Further clinical             300 and the cut-   information is needed to determine            point for age group   if ADHF is present.                                                             Elecsys proBNP II/ Elecsys proBNP II STAT           Cut-Point                       Interpretation  300 pg/mL                    NT-proBNP <300pg/mL indicates                             ADHF is not likely  Performed at [facility], [address], [place], [place] [address]   Troponin T, High Sensitivity     Status: Abnormal   Collection Time: 10/08/24 11:55 AM  Result Value Ref Range   Troponin T High Sensitivity 27 (H) 0 - 19 ng/L    Comment: (NOTE) Biotin concentrations > 1000 ng/mL falsely decrease TnT results.  Serial cardiac troponin measurements are suggested.  Refer to the Links section for chest pain algorithms and additional  guidance. Performed at [facility], [address], [place], [place] [address]   Resp panel by RT-PCR (RSV, Flu A&B, Covid) Urine, Clean Catch     Status: None   Collection Time: 10/08/24 11:55 AM   Specimen: Urine, Clean Catch; Nasal Swab  Result Value Ref Range   SARS Coronavirus 2 by RT PCR NEGATIVE NEGATIVE   Influenza A by PCR NEGATIVE NEGATIVE   Influenza B by PCR NEGATIVE NEGATIVE    Comment: (NOTE) The Xpert Xpress SARS-CoV-2/FLU/RSV plus assay is intended as an aid in the diagnosis of  influenza from  Nasopharyngeal swab specimens and should not be used as a sole basis for treatment. Nasal washings and aspirates are unacceptable for Xpert Xpress SARS-CoV-2/FLU/RSV testing.  Fact Sheet for Patients: bloggercourse.com  Fact Sheet for Healthcare Providers: seriousbroker.it  This test is not yet approved or cleared by the [place]  FDA and has been authorized for detection and/or diagnosis of SARS-CoV-2 by FDA under an Emergency Use Authorization (EUA). This EUA will remain in effect (meaning this test can be used) for the duration of the COVID-19 declaration under Section 564(b)(1) of the Act, 21 U.S.C. section 360bbb-3(b)(1), unless the authorization is terminated or revoked.     Resp Syncytial Virus by PCR NEGATIVE NEGATIVE    Comment: (NOTE) Fact Sheet for Patients: bloggercourse.com  Fact Sheet for Healthcare Providers: seriousbroker.it  This test is not yet approved or cleared by the [place]  FDA and has been authorized for detection and/or diagnosis of SARS-CoV-2 by FDA under an Emergency Use Authorization (EUA). This EUA will remain in effect (meaning this test can be used) for the duration of the COVID-19 declaration under Section 564(b)(1) of the Act, 21 U.S.C. section 360bbb-3(b)(1), unless the authorization is terminated or revoked.  Performed at [facility], [address] [place], [place] [address]   Group A Strep by PCR     Status: None   Collection Time: 10/08/24 11:55 AM   Specimen: Urine, Clean Catch; Sterile Swab  Result Value Ref Range   Group A Strep by PCR NOT DETECTED NOT DETECTED    Comment: Performed at [facility], [address] [place], [place] [address]  Lipase, blood     Status: Abnormal   Collection Time: 10/08/24 11:55 AM  Result Value Ref Range   Lipase 60 (H) 11 - 51 U/L    Comment: Performed at [facility]  Lab, [address] [place], [place] [address]  hCG, serum, qualitative     Status: None   Collection Time: 10/08/24 11:55 AM  Result Value Ref Range   Preg, Serum NEGATIVE NEGATIVE    Comment:        THE SENSITIVITY OF THIS METHODOLOGY IS >10 mIU/mL. Performed at [facility], [address], [place], [place] [address]   Troponin T, High Sensitivity     Status: Abnormal   Collection Time: 10/08/24 12:45 PM  Result Value Ref Range   Troponin T High Sensitivity 26 (H) 0 - 19 ng/L    Comment: (NOTE) Biotin concentrations > 1000 ng/mL falsely decrease TnT results.  Serial cardiac troponin measurements are suggested.  Refer to the Links section for chest pain algorithms and additional  guidance. Performed at [facility], [address], [place], [place] [address]   I-stat chem 8, ED (not at [facility], [facility] or [facility])     Status: Abnormal   Collection Time: 10/08/24 12:58 PM  Result Value Ref Range   Sodium 133 (L) 135 - 145 mmol/L   Potassium 3.4 (L) 3.5 - 5.1 mmol/L   Chloride 97 (L) 98 - 111 mmol/L   BUN 14 6 - 20 mg/dL   Creatinine, Ser 9.19 0.44 - 1.00 mg/dL   Glucose, Bld 526 (H) 70 - 99 mg/dL    Comment: Glucose reference range applies only to samples taken after fasting for at least 8 hours.   Calcium , Ion 1.06 (L) 1.15 - 1.40 mmol/L   TCO2 24 22 - 32 mmol/L   Hemoglobin 12.6 12.0 - 15.0  g/dL   HCT 62.9 63.9 - 53.9 %  I-Stat venous blood gas, ([facility] ED, [facility], [facility])     Status: Abnormal   Collection Time: 10/08/24 12:59 PM  Result Value Ref Range   pH, Ven 7.406 7.25 - 7.43   pCO2, Ven 42.2 (L) 44 - 60 mmHg   pO2, Ven 42 32 - 45 mmHg   Bicarbonate 26.5 20.0 - 28.0 mmol/L   TCO2 28 22 - 32 mmol/L   O2 Saturation 78 %   Acid-Base Excess 2.0 0.0 - 2.0 mmol/L   Sodium 133 (L) 135 - 145 mmol/L   Potassium 3.4 (L) 3.5 - 5.1 mmol/L   Calcium , Ion 1.03 (L) 1.15 - 1.40 mmol/L   HCT 36.0 36.0 - 46.0 %   Hemoglobin 12.2 12.0 - 15.0 g/dL   Sample type VENOUS   POC CBG, ED      Status: Abnormal   Collection Time: 10/08/24  2:46 PM  Result Value Ref Range   Glucose-Capillary 374 (H) 70 - 99 mg/dL    Comment: Glucose reference range applies only to samples taken after fasting for at least 8 hours.  Type and screen     Status: None   Collection Time: 10/08/24  9:28 PM  Result Value Ref Range   ABO/RH(D) B POS    Antibody Screen NEG    Sample Expiration      10/11/2024,2359 Performed at [facility], [address], [place], [place] [address]   Magnesium      Status: None   Collection Time: 10/08/24  9:29 PM  Result Value Ref Range   Magnesium  1.8 1.7 - 2.4 mg/dL    Comment: Performed at [facility], [address], [place], [place] [address]  D-dimer, quantitative     Status: Abnormal   Collection Time: 10/08/24  9:29 PM  Result Value Ref Range   D-Dimer, Quant 1.24 (H) 0.00 - 0.50 ug/mL-FEU    Comment: (NOTE) At the manufacturer cut-off value of 0.5 g/mL FEU, this assay has a negative predictive value of 95-100%.This assay is intended for use in conjunction with a clinical pretest probability (PTP) assessment model to exclude pulmonary embolism (PE) and deep venous thrombosis (DVT) in outpatients suspected of PE or DVT. Results should be correlated with clinical presentation. Performed at [facility], [address] [place], [place] [address]   CBC     Status: Abnormal   Collection Time: 10/08/24  9:29 PM  Result Value Ref Range   WBC 7.0 4.0 - 10.5 K/uL   RBC 4.34 3.87 - 5.11 MIL/uL   Hemoglobin 11.8 (L) 12.0 - 15.0 g/dL   HCT 63.8 63.9 - 53.9 %   MCV 83.2 80.0 - 100.0 fL   MCH 27.2 26.0 - 34.0 pg   MCHC 32.7 30.0 - 36.0 g/dL   RDW 86.9 88.4 - 84.4 %   Platelets 337 150 - 400 K/uL   nRBC 0.0 0.0 - 0.2 %    Comment: Performed at [facility], [address], [place], [place] [address]  T4, free     Status: None   Collection Time: 10/08/24  9:29 PM  Result Value Ref Range   Free T4 1.25 0.80 - 2.00 ng/dL    Comment:  Performed at [facility], [address] [place], [place] [address]  TSH     Status: None   Collection Time: 10/08/24  9:29 PM  Result Value Ref Range  TSH 0.372 0.350 - 4.500 uIU/mL    Comment: Performed at [facility], [address] [place], [place] [address]  CBG monitoring, ED     Status: Abnormal   Collection Time: 10/09/24 12:02 AM  Result Value Ref Range   Glucose-Capillary 278 (H) 70 - 99 mg/dL    Comment: Glucose reference range applies only to samples taken after fasting for at least 8 hours.  CBC     Status: None   Collection Time: 10/09/24  3:34 AM  Result Value Ref Range   WBC 7.4 4.0 - 10.5 K/uL   RBC 4.48 3.87 - 5.11 MIL/uL   Hemoglobin 12.1 12.0 - 15.0 g/dL   HCT 62.9 63.9 - 53.9 %   MCV 82.6 80.0 - 100.0 fL   MCH 27.0 26.0 - 34.0 pg   MCHC 32.7 30.0 - 36.0 g/dL   RDW 86.8 88.4 - 84.4 %   Platelets 337 150 - 400 K/uL   nRBC 0.0 0.0 - 0.2 %    Comment: Performed at [facility], [address], [place], [place] [address]  Comprehensive metabolic panel     Status: Abnormal   Collection Time: 10/09/24  3:34 AM  Result Value Ref Range   Sodium 136 135 - 145 mmol/L   Potassium 3.6 3.5 - 5.1 mmol/L   Chloride 100 98 - 111 mmol/L   CO2 24 22 - 32 mmol/L   Glucose, Bld 292 (H) 70 - 99 mg/dL    Comment: Glucose reference range applies only to samples taken after fasting for at least 8 hours.   BUN 13 6 - 20 mg/dL   Creatinine, Ser 9.15 0.44 - 1.00 mg/dL   Calcium  8.4 (L) 8.9 - 10.3 mg/dL   Total Protein 6.8 6.5 - 8.1 g/dL   Albumin 2.9 (L) 3.5 - 5.0 g/dL   AST 20 15 - 41 U/L   ALT 18 0 - 44 U/L   Alkaline Phosphatase 97 38 - 126 U/L   Total Bilirubin 0.2 0.0 - 1.2 mg/dL   GFR, Estimated >39 >39 mL/min    Comment: (NOTE) Calculated using the CKD-EPI Creatinine Equation (2021)    Anion gap 12 5 - 15    Comment: Performed at [facility], [address], [place], [place] [address]  Magnesium      Status: None   Collection Time:  10/09/24  3:34 AM  Result Value Ref Range   Magnesium  1.8 1.7 - 2.4 mg/dL    Comment: Performed at [facility], [address] [place], [place] [address]  Glucose, capillary     Status: Abnormal   Collection Time: 10/09/24  7:42 AM  Result Value Ref Range   Glucose-Capillary 326 (H) 70 - 99 mg/dL    Comment: Glucose reference range applies only to samples taken after fasting for at least 8 hours.    ECG   N/A  Telemetry   Sinus rhythm - Personally Reviewed  Radiology    MR BRAIN WO CONTRAST Result Date: 10/08/2024 EXAM: MRI Brain Without Contrast 10/08/2024 08:46:02 PM TECHNIQUE: Multiplanar multisequence MRI of the head/brain was performed without the administration of intravenous contrast. COMPARISON: None available. CLINICAL HISTORY: Syncope/presyncope, cerebrovascular cause suspected FINDINGS: BRAIN AND VENTRICLES: No acute infarct. Remote left parietal cortical infarct. No intracranial hemorrhage. No mass. No midline shift. No hydrocephalus. The sella is unremarkable. Normal flow voids. ORBITS: No significant abnormality. SINUSES AND MASTOIDS: No significant abnormality. BONES AND SOFT TISSUES: Normal marrow  signal. No soft tissue abnormality. IMPRESSION: 1. No acute abnormality. 2. Remote left parietal cortical infarct. Electronically signed by: Glendia Molt MD 10/08/2024 08:55 PM EST RP Workstation: [id number]   CT Head Wo Contrast Result Date: 10/08/2024 CLINICAL DATA:  Near syncopal episode. EXAM: CT HEAD WITHOUT CONTRAST TECHNIQUE: Contiguous axial images were obtained from the base of the skull through the vertex without intravenous contrast. RADIATION DOSE REDUCTION: This exam was performed according to the departmental dose-optimization program which includes automated exposure control, adjustment of the mA and/or kV according to patient size and/or use of iterative reconstruction technique. COMPARISON:  01/30/2023 FINDINGS: Brain: No evidence of acute infarction,  hemorrhage, hydrocephalus, extra-axial collection or mass lesion/mass effect. Vascular: No hyperdense vessel or unexpected calcification. Skull: Normal. Negative for fracture or focal lesion. Sinuses/Orbits: No acute finding. Other: None. IMPRESSION: No acute intracranial pathology. Electronically Signed   By: Toribio Agreste M.D.   On: 10/08/2024 15:39   DG Chest Portable 1 View Result Date: 10/08/2024 CLINICAL DATA:  Hyperglycemia with possible CHF exacerbation. EXAM: PORTABLE CHEST 1 VIEW COMPARISON:  July 15, 2024 FINDINGS: The cardiac silhouette is mildly enlarged and unchanged in size. Low lung volumes are noted. No acute infiltrate, pleural effusion or pneumothorax is identified. The visualized skeletal structures are unremarkable. IMPRESSION: Low lung volumes without acute or active cardiopulmonary disease. Electronically Signed   By: Suzen Dials M.D.   On: 10/08/2024 13:33    Cardiac Studies   Echo pending  Assessment   Principal Problem:   Acute diastolic (congestive) heart failure (HCC) Active Problems:   Dizziness   Plan   Not much recorded overnight, but she says she had a good response to lasix  this morning. Monitor strict I's/O's  and daily weights. Echo in progress. Given chest pain and inconclusive CTA recently, will arrange for [facility] tomorrow. She was not dyspneic lying flat today.  Informed Consent   Shared Decision Making/Informed Consent The risks [stroke (1 in 1000), death (1 in 1000), kidney failure [usually temporary] (1 in 500), bleeding (1 in 200), allergic reaction [possibly serious] (1 in 200)], benefits (diagnostic support and management of coronary artery disease) and alternatives of a cardiac catheterization were discussed in detail with Ms. Lizana and she is willing to proceed.      Time Spent Directly with Patient:  I have spent a total of 25 minutes with the patient reviewing hospital notes, telemetry, EKGs, labs and examining the patient as well  as establishing an assessment and plan that was discussed personally with the patient.  > 50% of time was spent in direct patient care.  Length of Stay:  LOS: 1 day   Vinie KYM Maxcy, MD, [facility], FNLA, FACP  [place]  [facility]  Medical Director of the [facility]  [facility] Diplomate of the American Board of Clinical Lipidology Attending Cardiologist  Direct Dial : [phone]  Fax: [phone]  Website:  [email or web address] Vinie JAYSON Maxcy 10/09/2024, 9:37 AM   "

## 2024-10-09 NOTE — Progress Notes (Signed)
 " PROGRESS NOTE  Carolyn Zimmerman  DOB: 08/23/1986  PCP: Leonarda Roxan BROCKS, NP FMW:[id number]  DOA: 10/08/2024  LOS: 1 day  Hospital Day: 2  Subjective: Patient was seen and examined this morning. Pleasant young African-American female morbidly obese. Lying down in bed.  Not in distress. Family not at bedside Afebrile, heart rate in 70s blood pressure 150s, breathing on room  Brief narrative: Carolyn Zimmerman is a 39 y.o. female with PMH significant for morbid obesity, DM2, resistant hypertension, HLD, CVA, CHF, h/o smoking.  1/21-patient was at cardiology office where she had a near syncopal episode and brought to the ED by EMS.   Previously seen at cardiology office on 1/14 as a routine visit.  Was noted to have lower extremity edema despite being on torsemide  and Aldactone .  3 days of oral Lasix  was added.  She returned to the office on 1/21 for a follow-up visit.  Reported lightheadedness, dizziness and almost passed out after standing up to leave the office.  EMS noted blood sugar level elevated to 532.  Reports she has not been taking her insulin  for the last week.  Not feeling well.  In the ED, patient was afebrile, blood pressure 150/87, heart rate 81, breathing room air. Labs with proBNP 783, Hs-troponins 27 >> 26.  CMP with sodium 131, renal function normal, CBC unremarkable EKG showed sinus rhythm, rate 82 bpm.  RVP unremarkable Chest x-ray unremarkable CT head unremarkable  Patient was given IV Lasix  Admitted to [facility] Cardiology was consulted   Assessment and plan: Acute exacerbation of chronic CHF  Presented with persistent lower extremity edema for the 3 weeks and lightheadedness, dizziness after taking Lasix  for 3 days On exam, noted to have 2+ pedal edema bilaterally Chest x-ray without pulm edema but proBNP elevated to 783 Most recent echo from 06/2024 showed EF of 60 to 65%, mild LVH, normal LV systolic function PTA meds- carvedilol  25 mg twice daily, valsartan  320 mg  daily, torsemide  20 mg daily, Aldactone  50 mg daily, hydralazine  50 mg 3 times daily.  But apparently patient had not been taking her medications for the past few weeks because of cost. Cardiology consulted Started on IV Lasix  80 mg twice daily as well as irbesartan  300 mg daily and Aldactone  50 mg daily. Defer to cardiology for further diuresis Repeat echo Continue to monitor for daily intake output, weight, blood pressure, BNP, renal function and electrolytes. Net IO Since Admission: -360 mL [10/09/24 1152] Recent Labs  Lab 10/08/24 1155 10/08/24 1258 10/08/24 1259 10/08/24 2129 10/09/24 0334  PROBNP 783.0*  --   --   --   --   BUN 13 14  --   --  13  CREATININE 0.78 0.80  --   --  0.84  NA 131* 133* 133*  --  136  K 3.7 3.4* 3.4*  --  3.6  MG  --   --   --  1.8 1.8   Uncontrolled hypertension Has history of uncontrolled hypertension despite being on multiple different medicines as above.  Also complicated by recent noncompliance due to cost. Ultrasound duplex renal artery in 2023 did not show any evidence of renal artery stenosis.  Sleep apnea study was negative 1/20-seen by cardiology Dr. Darron and planned for renal denervation. Current CHF/HTN meds as above Continue to monitor IV hydralazine  as needed  Elevated troponin  Chest pain  Complains of intermittent chest pain with dyspnea for the last few months  Troponin mildly elevated without ischemic changes and  EKG Cardiac CTA on 05/2024 inconclusive Cardiology plans for cardiac cath once closer to euvolemia  Type 2 diabetes mellitus uncontrolled Peripheral neuropathy A1c 13.5 on 06/2024.  Repeat A1c PTA meds-Lantus  50 units twice daily, Humalog  Premeal 20 units 3 times daily. Reportedly not been taking her medications lately because of not feeling well Currently on SSI/Accu-Cheks.  Diabetes care coordinator consult appreciated. This morning, we will add Lantus  25 units twice daily and NovoLog  scheduled 5 minutes 3 times  daily Continue gabapentin  300 mg twice daily for neuropathy. Lab Results  Component Value Date   HGBA1C 13.5 (H) 07/16/2024     Recent Labs  Lab 10/08/24 1050 10/08/24 1446 10/09/24 0002 10/09/24 0742 10/09/24 1143  GLUCAP 578* 374* 278* 326* 309*   H/o CVA  HLD Continue aspirin , Crestor .  Hypokalemia While on diuretics.  Potassium replacement given Continue to monitor  Sore throat Evaluation thus far is negative with a negative group A strep PCR, respiratory panel is negative for flu RSV and COVID, differentials could also be reflux related.  started on PPI empirically Also started Chloraseptic spray    Nutrition Status:         Mobility: Encourage ambulation  PT Orders:   PT Follow up Rec:     Goals of care   Code Status: Full Code     DVT prophylaxis:  heparin  injection 5,000 Units Start: 10/08/24 1745   Antimicrobials: None Fluid: None Consultants: Cardiology Family Communication: None at bedside  Status: Inpatient Level of care:  Telemetry   Patient is from: Home Needs to continue in-hospital care: Ongoing IV diuresis Anticipated d/c to: Hopefully home in 1 to 2 days      Diet:  Diet Order             Diet heart healthy/carb modified Room service appropriate? Yes; Fluid consistency: Thin; Fluid restriction: 1500 mL Fluid  Diet effective now                   Scheduled Meds:  aspirin   81 mg Oral Daily   furosemide   80 mg Intravenous BID   gabapentin   300 mg Oral BID   heparin   5,000 Units Subcutaneous Q8H   insulin  aspart  0-15 Units Subcutaneous TID WC   insulin  aspart  0-5 Units Subcutaneous QHS   insulin  aspart  5 Units Subcutaneous TID WC   insulin  glargine  25 Units Subcutaneous BID   irbesartan   300 mg Oral Daily   nicotine   14 mg Transdermal Daily   [START ON 10/10/2024] pantoprazole   40 mg Oral QAC breakfast   rosuvastatin   20 mg Oral Daily   sodium chloride  flush  3 mL Intravenous Q12H   sodium chloride  flush  3  mL Intravenous Q12H   spironolactone   50 mg Oral Daily    PRN meds: sodium chloride , acetaminophen  **OR** acetaminophen , hydrALAZINE , menthol , phenol, sodium chloride  flush   Infusions:   sodium chloride       Antimicrobials: Anti-infectives (From admission, onward)    None       Objective: Vitals:   10/09/24 0158 10/09/24 0502  BP: (!) 157/98 (!) 158/86  Pulse: 82 76  Resp: 18 17  Temp: (!) 97.5 F (36.4 C) 97.6 F (36.4 C)  SpO2: 100% 100%    Intake/Output Summary (Last 24 hours) at 10/09/2024 1152 Last data filed at 10/09/2024 0230 Gross per 24 hour  Intake 240 ml  Output 600 ml  Net -360 ml   Filed Weights   10/08/24  1043 10/09/24 0500  Weight: 129.9 kg 125.9 kg   Weight change:  Body mass index is 43.46 kg/m.   Physical Exam: General exam: Pleasant, morbidly obese young African-American female Skin: No rashes, lesions or ulcers. HEENT: Atraumatic, normocephalic, no obvious bleeding Lungs: Clear to auscultation bilaterally,  CVS: S1, S2, no murmur,   GI/Abd: Soft, nontender, nondistended, bowel sound present,   CNS: Alert, awake, oriented x 3 Psychiatry: Mood Extremities: Bilateral pedal edema, no calf tenderness,  Data Review: I have personally reviewed the laboratory data and studies available.  F/u labs ordered Unresulted Labs (From admission, onward)     Start     Ordered   10/10/24 0500  Hemoglobin A1c  Tomorrow morning,   R       Comments: To assess prior glycemic control    10/09/24 0955   10/09/24 0500  Occult blood card to lab, stool  Daily,   R      10/08/24 1805   10/08/24 1730  Urinalysis, Routine w reflex microscopic -Urine, Random  Add-on,   AD       Comments: Check for protein due to low albumin.   Question:  Specimen Source  Answer:  Urine, Random   10/08/24 1730   Unscheduled  Basic metabolic panel with GFR  Tomorrow morning,   R        10/09/24 1152   Unscheduled  CBC with Differential/Platelet  Tomorrow morning,   R         10/09/24 1152            Signed, Chapman Rota, MD [facility] 10/09/2024  "

## 2024-10-09 NOTE — Inpatient Diabetes Management (Addendum)
 Inpatient Diabetes Program Recommendations  AACE/ADA: New Consensus Statement on Inpatient Glycemic Control (2015)  Target Ranges:  Prepandial:   less than 140 mg/dL      Peak postprandial:   less than 180 mg/dL (1-2 hours)      Critically ill patients:  140 - 180 mg/dL   Lab Results  Component Value Date   GLUCAP 326 (H) 10/09/2024   HGBA1C 13.5 (H) 07/16/2024    Review of Glycemic Control  Latest Reference Range & Units 10/08/24 14:46 10/09/24 00:02 10/09/24 07:42  Glucose-Capillary 70 - 99 mg/dL 625 (H) 721 (H) 673 (H)   Diabetes history: DM 2 Outpatient Diabetes medications:  FSL 3 Lantus  50 units bid Humalog  20 units tid with meals  Current orders for Inpatient glycemic control:  Novolog  0-15 units tid with meals and HS Inpatient Diabetes Program Recommendations:   Please consider adding Lantus  25 units bid.  Also consider adding Novolog  5 units tid with meals (hold if patient eats less than 50% or NPO).   Addendum:  1435- Spoke to patient at bedside.  She was very sleepy but woke up to speak to me.  She states she has a difficult time affording the 4$ co-pay for her insulins since she is not working.  We briefly discussed importance of taking medications and the long term consequences of uncontrolled DM.  She has sensors at home and states she plans to use at discharge. Will need close f/u with PCP.    Thanks,  Randall Bullocks, RN, BC-ADM Inpatient Diabetes Coordinator Pager [phone]  (8a-5p)

## 2024-10-09 NOTE — TOC Initial Note (Signed)
 Transition of Care [facility]) - Initial/Assessment Note    Patient Details  Name: Carolyn Zimmerman MRN: [id number] Date of Birth: [birth year]  Transition of Care [facility] CM/SW Contact:    Lendia Dais, LCSWA Phone Number: 10/09/2024, 3:14 PM  Clinical Narrative: Pt is from home with mother and 2 minor children ages 61 and 70. Pt is able drive, receives assistance w/ ADL's from mother, and does not have any DME.   Pt has seen their PCP in the last year. Pt states they are unable to work and receive no income. Pt states they have filed for disability and are pending a decision. Pt reports no hx of MH dx or SU.   No current TOC needs. Please place a [facility] consult for further needs.  Expected Discharge Plan: Home/Self Care Barriers to Discharge: Continued Medical Work up   Patient Goals and CMS Choice Patient states their goals for this hospitalization and ongoing recovery are:: Receive disability and not to be readmitted to Hospital   Choice offered to / list presented to : NA      Expected Discharge Plan and Services In-house Referral: Clinical Social Work     Living arrangements for the past 2 months: Single Family Home                                      Prior Living Arrangements/Services Living arrangements for the past 2 months: Single Family Home Lives with:: Minor Children, Parents Patient language and need for interpreter reviewed:: Yes Do you feel safe going back to the place where you live?: Yes      Need for Family Participation in Patient Care: Yes (Comment) Care giver support system in place?: Yes (comment) Current home services: DME (Asthma inhaler) Criminal Activity/Legal Involvement Pertinent to Current Situation/Hospitalization: No - Comment as needed  Activities of Daily Living   ADL Screening (condition at time of admission) Independently performs ADLs?: Yes (appropriate for developmental age) Is the patient deaf or have difficulty hearing?: No Does  the patient have difficulty seeing, even when wearing glasses/contacts?: No Does the patient have difficulty concentrating, remembering, or making decisions?: No  Permission Sought/Granted Permission sought to share information with : Family Supports Permission granted to share information with : Yes, Verbal Permission Granted  Share Information with NAME: Sonya Sprinkle     Permission granted to share info w Relationship: Mother  Permission granted to share info w Contact Information: [phone]  Emotional Assessment Appearance:: Appears stated age Attitude/Demeanor/Rapport: Engaged Affect (typically observed): Pleasant, Appropriate Orientation: : Oriented to Self, Oriented to Situation, Oriented to Place, Oriented to  Time Alcohol / Substance Use: Not Applicable Psych Involvement: No (comment)  Admission diagnosis:  Dizziness [R42] Hyperglycemia [R73.9] Patient Active Problem List   Diagnosis Date Noted   Acute diastolic (congestive) heart failure (HCC) 10/09/2024   Dizziness 10/08/2024   Sepsis due to cellulitis (HCC) 07/17/2024   CHF exacerbation (HCC) 07/16/2024   Mild nonproliferative diabetic retinopathy of right eye with macular edema associated with type 2 diabetes mellitus (HCC) 03/12/2024   AKI (acute kidney injury) 01/08/2024   Hyperglycemia due to type 2 diabetes mellitus (HCC) 01/06/2024   Prolonged QT interval 01/06/2024   Cellulitis 10/31/2023   Cellulitis of right leg 10/30/2023   Class 3 obesity (HCC) 10/30/2023   Heart failure with mildly reduced ejection fraction (HCC) 10/29/2023   Diabetes due to undrl condition w oth diabetic neuro  comp (HCC) 10/29/2023   SIRS (systemic inflammatory response syndrome) (HCC) 10/21/2023   Leukocytosis 10/13/2023   Cellulitis of right lower extremity 10/12/2023   Tobacco abuse 10/12/2023   Hypomagnesemia 10/12/2023   Hypophosphatemia 10/12/2023   Hypokalemia 10/12/2023   Protein-calorie malnutrition, severe 10/12/2023    Hypertension 10/12/2023   Left carotid artery stenosis 05/31/2023   Left upper arm pain 05/17/2023   History of CVA (cerebrovascular accident) 12/19/2022   Cerebrovascular accident (CVA) (HCC) 11/02/2022   Healthcare maintenance 04/22/2022   Needs smoking cessation education 04/22/2022   Anxiety 04/22/2022   Depression 04/22/2022   Encounter to establish care with new doctor 04/22/2022   Morbid obesity (HCC) 01/17/2022   Insulin -requiring or dependent type II diabetes mellitus (HCC) 01/17/2022   Mixed hyperlipidemia 01/17/2022   Medication management 12/29/2021   Screening for hyperlipidemia 12/29/2021   Hypertensive heart disease without heart failure 12/29/2021   Accelerated hypertension 12/08/2021   Daytime somnolence 12/08/2021   Fatigue 12/08/2021   Depressed left ventricular ejection fraction 12/08/2021   Panniuclitis with abdominal wall abscess s/p panniculectomy 05/02/2020 05/02/2020   Obesity, Class III, BMI 40-49.9 (morbid obesity) (HCC) 05/02/2020   COVID-19 virus infection 05/02/2020   Panniculitis abdominal wall 05/02/2020   Necrotizing fasciitis (HCC) 05/02/2020   Hidradenitis suppurativa of axillae,chest wall, groins 05/02/2020   Type 2 diabetes mellitus with hyperglycemia (HCC) 10/18/2019   TOA (tubo-ovarian abscess) 10/17/2019   Elevated LFTs 09/16/2018   Uncontrolled diabetes mellitus with hyperglycemia (HCC) 09/16/2018   Uncontrolled hypertension 09/16/2018   Increased frequency of urination 09/16/2018   Bile duct stricture (HCC) 07/30/2018   Chest wall abscesses x 2 s/p I&D 05/02/2020 06/15/2017   Hyperglycemia 06/15/2017   Hx of medication noncompliance    PCP:  Leonarda Roxan BROCKS, NP Pharmacy:   [facility] [phone] [place], [place] - [address] AT [facility] OF [address] & [facility] [address] [place] [address] Phone: [phone] Fax: [phone]  [place] [facility] [address], [address] [place] [address] Phone: [phone] Fax: [phone]     Social Drivers of Health (SDOH) Social History: SDOH Screenings   Food Insecurity: No Food Insecurity (10/09/2024)  Housing: Low Risk (10/09/2024)  Transportation Needs: No Transportation Needs (10/09/2024)  Utilities: Not At Risk (10/09/2024)  Alcohol Screen: Low Risk (05/11/2024)  Depression (PHQ2-9): Low Risk (07/25/2024)  Financial Resource Strain: Medium Risk (05/11/2024)  Physical Activity: Insufficiently Active (05/11/2024)  Social Connections: Socially Isolated (10/09/2024)  Stress: Stress Concern Present (05/11/2024)  Tobacco Use: Medium Risk (10/08/2024)   SDOH Interventions:     Readmission Risk Interventions     No data to display

## 2024-10-09 NOTE — ED Notes (Signed)
 Report given to [facility] on 91M

## 2024-10-10 ENCOUNTER — Encounter (HOSPITAL_COMMUNITY): Admission: EM | Disposition: A | Payer: Self-pay | Source: Home / Self Care | Attending: Internal Medicine

## 2024-10-10 DIAGNOSIS — I5031 Acute diastolic (congestive) heart failure: Secondary | ICD-10-CM | POA: Diagnosis not present

## 2024-10-10 LAB — GLUCOSE, CAPILLARY
Glucose-Capillary: 409 mg/dL — ABNORMAL HIGH (ref 70–99)
Glucose-Capillary: 398 mg/dL — ABNORMAL HIGH (ref 70–99)

## 2024-10-10 LAB — POCT I-STAT 7, (LYTES, BLD GAS, ICA,H+H)
Acid-Base Excess: 1 mmol/L (ref 0.0–2.0)
Bicarbonate: 25.9 mmol/L (ref 20.0–28.0)
Calcium, Ion: 1.23 mmol/L (ref 1.15–1.40)
HCT: 36 % (ref 36.0–46.0)
Hemoglobin: 12.2 g/dL (ref 12.0–15.0)
O2 Saturation: 92 %
Potassium: 3.9 mmol/L (ref 3.5–5.1)
Sodium: 137 mmol/L (ref 135–145)
TCO2: 27 mmol/L (ref 22–32)
pCO2 arterial: 41.4 mmHg (ref 32–48)
pH, Arterial: 7.404 (ref 7.35–7.45)
pO2, Arterial: 63 mmHg — ABNORMAL LOW (ref 83–108)

## 2024-10-10 LAB — POCT I-STAT EG7
Acid-Base Excess: 1 mmol/L (ref 0.0–2.0)
Acid-Base Excess: 2 mmol/L (ref 0.0–2.0)
Bicarbonate: 27 mmol/L (ref 20.0–28.0)
Bicarbonate: 27.1 mmol/L (ref 20.0–28.0)
Calcium, Ion: 1.23 mmol/L (ref 1.15–1.40)
Calcium, Ion: 1.23 mmol/L (ref 1.15–1.40)
HCT: 36 % (ref 36.0–46.0)
HCT: 36 % (ref 36.0–46.0)
Hemoglobin: 12.2 g/dL (ref 12.0–15.0)
Hemoglobin: 12.2 g/dL (ref 12.0–15.0)
O2 Saturation: 59 %
O2 Saturation: 60 %
Potassium: 4 mmol/L (ref 3.5–5.1)
Potassium: 4 mmol/L (ref 3.5–5.1)
Sodium: 137 mmol/L (ref 135–145)
Sodium: 138 mmol/L (ref 135–145)
TCO2: 28 mmol/L (ref 22–32)
TCO2: 29 mmol/L (ref 22–32)
pCO2, Ven: 45.3 mmHg (ref 44–60)
pCO2, Ven: 45.8 mmHg (ref 44–60)
pH, Ven: 7.381 (ref 7.25–7.43)
pH, Ven: 7.383 (ref 7.25–7.43)
pO2, Ven: 31 mmHg — CL (ref 32–45)
pO2, Ven: 32 mmHg (ref 32–45)

## 2024-10-10 LAB — BASIC METABOLIC PANEL WITH GFR
Anion gap: 8 (ref 5–15)
BUN: 12 mg/dL (ref 6–20)
CO2: 26 mmol/L (ref 22–32)
Calcium: 8.5 mg/dL — ABNORMAL LOW (ref 8.9–10.3)
Chloride: 102 mmol/L (ref 98–111)
Creatinine, Ser: 0.76 mg/dL (ref 0.44–1.00)
GFR, Estimated: >60 mL/min
Glucose, Bld: 308 mg/dL — ABNORMAL HIGH (ref 70–99)
Potassium: 3.4 mmol/L — ABNORMAL LOW (ref 3.5–5.1)
Sodium: 136 mmol/L (ref 135–145)

## 2024-10-10 LAB — CBC WITH DIFFERENTIAL/PLATELET
Abs Immature Granulocytes: 0.04 K/uL (ref 0.00–0.07)
Basophils Absolute: 0 K/uL (ref 0.0–0.1)
Basophils Relative: 0 %
Eosinophils Absolute: 0.1 K/uL (ref 0.0–0.5)
Eosinophils Relative: 2 %
HCT: 34.8 % — ABNORMAL LOW (ref 36.0–46.0)
Hemoglobin: 11.5 g/dL — ABNORMAL LOW (ref 12.0–15.0)
Immature Granulocytes: 1 %
Lymphocytes Relative: 48 %
Lymphs Abs: 3.7 K/uL (ref 0.7–4.0)
MCH: 27.4 pg (ref 26.0–34.0)
MCHC: 33 g/dL (ref 30.0–36.0)
MCV: 83.1 fL (ref 80.0–100.0)
Monocytes Absolute: 0.6 K/uL (ref 0.1–1.0)
Monocytes Relative: 8 %
Neutro Abs: 3.2 K/uL (ref 1.7–7.7)
Neutrophils Relative %: 41 %
Platelets: 317 K/uL (ref 150–400)
RBC: 4.19 MIL/uL (ref 3.87–5.11)
RDW: 13.2 % (ref 11.5–15.5)
WBC: 7.7 K/uL (ref 4.0–10.5)
nRBC: 0 % (ref 0.0–0.2)

## 2024-10-10 LAB — HEMOGLOBIN A1C
Hgb A1c MFr Bld: 13.9 % — ABNORMAL HIGH (ref 4.8–5.6)
Mean Plasma Glucose: 352.23 mg/dL

## 2024-10-10 LAB — POCT ACTIVATED CLOTTING TIME
Activated Clotting Time: 230 s
Activated Clotting Time: 261 s

## 2024-10-10 LAB — GLUCOSE, RANDOM: Glucose, Bld: 458 mg/dL — ABNORMAL HIGH (ref 70–99)

## 2024-10-10 MED ORDER — INSULIN ASPART 100 UNIT/ML IJ SOLN
INTRAMUSCULAR | Status: AC
  Filled 2024-10-10: qty 20

## 2024-10-10 MED ORDER — NITROGLYCERIN 1 MG/10 ML FOR IR/CATH LAB
INTRA_ARTERIAL | Status: DC | PRN
  Administered 2024-10-10: 200 ug via INTRACORONARY

## 2024-10-10 MED ORDER — LIDOCAINE HCL (PF) 1 % IJ SOLN
INTRAMUSCULAR | Status: DC | PRN
  Administered 2024-10-10 (×2): 3 mL

## 2024-10-10 MED ORDER — INSULIN GLARGINE 100 UNIT/ML ~~LOC~~ SOLN
35.0000 [IU] | Freq: Two times a day (BID) | SUBCUTANEOUS | Status: DC
  Administered 2024-10-10 – 2024-10-11 (×2): 35 [IU] via SUBCUTANEOUS
  Filled 2024-10-10 (×3): qty 0.35

## 2024-10-10 MED ORDER — LABETALOL HCL 5 MG/ML IV SOLN: 10.0000 mg | INTRAVENOUS | Status: AC | PRN

## 2024-10-10 MED ORDER — POTASSIUM CHLORIDE CRYS ER 20 MEQ PO TBCR
40.0000 meq | EXTENDED_RELEASE_TABLET | Freq: Once | ORAL | Status: AC
  Administered 2024-10-10: 40 meq via ORAL

## 2024-10-10 MED ORDER — SODIUM CHLORIDE 0.9% FLUSH
3.0000 mL | Freq: Two times a day (BID) | INTRAVENOUS | Status: DC
  Administered 2024-10-10 – 2024-10-11 (×2): 3 mL via INTRAVENOUS

## 2024-10-10 MED ORDER — SODIUM CHLORIDE 0.9% FLUSH: 3.0000 mL | INTRAVENOUS | Status: DC | PRN

## 2024-10-10 MED ORDER — MIDAZOLAM HCL 2 MG/2ML IJ SOLN
INTRAMUSCULAR | Status: AC
  Filled 2024-10-10: qty 2

## 2024-10-10 MED ORDER — METHYLPREDNISOLONE SODIUM SUCC 125 MG IJ SOLR
125.0000 mg | Freq: Once | INTRAMUSCULAR | Status: AC
  Administered 2024-10-10: 125 mg via INTRAVENOUS
  Filled 2024-10-10: qty 2

## 2024-10-10 MED ORDER — FENTANYL CITRATE (PF) 100 MCG/2ML IJ SOLN
INTRAMUSCULAR | Status: AC
  Filled 2024-10-10: qty 2

## 2024-10-10 MED ORDER — NITROGLYCERIN 1 MG/10 ML FOR IR/CATH LAB
INTRA_ARTERIAL | Status: AC
  Filled 2024-10-10: qty 10

## 2024-10-10 MED ORDER — TICAGRELOR 90 MG PO TABS
90.0000 mg | ORAL_TABLET | Freq: Two times a day (BID) | ORAL | Status: DC
  Administered 2024-10-10 – 2024-10-11 (×2): 90 mg via ORAL
  Filled 2024-10-10 (×2): qty 1

## 2024-10-10 MED ORDER — HEPARIN (PORCINE) IN NACL 1000-0.9 UT/500ML-% IV SOLN
INTRAVENOUS | Status: DC | PRN
  Administered 2024-10-10 (×4): 500 mL

## 2024-10-10 MED ORDER — VERAPAMIL HCL 2.5 MG/ML IV SOLN
INTRAVENOUS | Status: DC | PRN
  Administered 2024-10-10: 10 mL via INTRA_ARTERIAL

## 2024-10-10 MED ORDER — SODIUM CHLORIDE 0.9 % IV SOLN
INTRAVENOUS | Status: AC | PRN
  Administered 2024-10-10: 20 mL/h via INTRAVENOUS

## 2024-10-10 MED ORDER — HEPARIN SODIUM (PORCINE) 1000 UNIT/ML IJ SOLN
INTRAMUSCULAR | Status: AC
  Filled 2024-10-10: qty 10

## 2024-10-10 MED ORDER — TICAGRELOR 90 MG PO TABS
ORAL_TABLET | ORAL | Status: DC | PRN
  Administered 2024-10-10: 180 mg via ORAL

## 2024-10-10 MED ORDER — MIDAZOLAM HCL (PF) 2 MG/2ML IJ SOLN
INTRAMUSCULAR | Status: DC | PRN
  Administered 2024-10-10: 1 mg via INTRAVENOUS
  Administered 2024-10-10: 2 mg via INTRAVENOUS

## 2024-10-10 MED ORDER — IOHEXOL 350 MG/ML SOLN
INTRAVENOUS | Status: DC | PRN
  Administered 2024-10-10: 135 mL

## 2024-10-10 MED ORDER — SODIUM CHLORIDE 0.9 % IV SOLN: 250.0000 mL | INTRAVENOUS | Status: AC | PRN

## 2024-10-10 MED ORDER — VERAPAMIL HCL 2.5 MG/ML IV SOLN
INTRAVENOUS | Status: AC
  Filled 2024-10-10: qty 2

## 2024-10-10 MED ORDER — DIPHENHYDRAMINE HCL 50 MG/ML IJ SOLN
50.0000 mg | Freq: Once | INTRAMUSCULAR | Status: AC
  Administered 2024-10-10: 50 mg via INTRAVENOUS
  Filled 2024-10-10: qty 1

## 2024-10-10 MED ORDER — HEPARIN SODIUM (PORCINE) 1000 UNIT/ML IJ SOLN
INTRAMUSCULAR | Status: DC | PRN
  Administered 2024-10-10: 2000 [IU] via INTRAVENOUS
  Administered 2024-10-10: 4000 [IU] via INTRAVENOUS
  Administered 2024-10-10: 5000 [IU] via INTRAVENOUS
  Administered 2024-10-10: 6000 [IU] via INTRAVENOUS

## 2024-10-10 MED ORDER — POTASSIUM CHLORIDE CRYS ER 20 MEQ PO TBCR
EXTENDED_RELEASE_TABLET | ORAL | Status: AC
  Filled 2024-10-10: qty 2

## 2024-10-10 MED ORDER — ASPIRIN 81 MG PO CHEW
CHEWABLE_TABLET | ORAL | Status: AC
  Filled 2024-10-10: qty 1

## 2024-10-10 MED ORDER — ASPIRIN 81 MG PO CHEW
81.0000 mg | CHEWABLE_TABLET | Freq: Every day | ORAL | Status: DC
  Filled 2024-10-10: qty 1

## 2024-10-10 MED ORDER — FENTANYL CITRATE (PF) 100 MCG/2ML IJ SOLN
INTRAMUSCULAR | Status: DC | PRN
  Administered 2024-10-10: 50 ug via INTRAVENOUS
  Administered 2024-10-10: 25 ug via INTRAVENOUS

## 2024-10-10 MED ORDER — TICAGRELOR 90 MG PO TABS
ORAL_TABLET | ORAL | Status: AC
  Filled 2024-10-10: qty 2

## 2024-10-10 MED ORDER — FREE WATER
500.0000 mL | Freq: Once | Status: AC
  Administered 2024-10-10: 500 mL via ORAL

## 2024-10-10 MED ORDER — ONDANSETRON HCL 4 MG/2ML IJ SOLN
INTRAMUSCULAR | Status: DC | PRN
  Administered 2024-10-10: 4 mg via INTRAVENOUS

## 2024-10-10 MED ORDER — LIDOCAINE HCL (PF) 1 % IJ SOLN
INTRAMUSCULAR | Status: AC
  Filled 2024-10-10: qty 30

## 2024-10-10 MED ORDER — ASPIRIN 81 MG PO CHEW
81.0000 mg | CHEWABLE_TABLET | Freq: Every day | ORAL | Status: DC
  Administered 2024-10-11: 81 mg via ORAL
  Filled 2024-10-10: qty 1

## 2024-10-10 NOTE — Progress Notes (Signed)
 Notified MD about blood sugar being 409 and that lab came to draw blood to confirm. Verbal order given for 15 units of Novolog  plus sliding scale for a total of 20 units and Lantus  order as written for 35 units.

## 2024-10-10 NOTE — Progress Notes (Signed)
 " PROGRESS NOTE  Carolyn Zimmerman  DOB: [birth year]  PCP: Leonarda Roxan BROCKS, NP FMW:[id number]  DOA: 10/08/2024  LOS: 2 days  Hospital Day: 3  Subjective: Patient was seen and examined this morning.   Lying down.  Not in distress. Pending cardiac cath today Afebrile, blood pressure in 160s and 170s, Labs this morning with potassium low at 3.4 Blood glucose level remains elevated over 200 and 300  Brief narrative: Carolyn Zimmerman is a 39 y.o. female with PMH significant for morbid obesity, DM2, resistant hypertension, HLD, CVA, CHF, h/o smoking.  1/21-patient was at cardiology office where she had a near syncopal episode and brought to the ED by EMS.   Previously seen at cardiology office on 1/14 as a routine visit.  Was noted to have lower extremity edema despite being on torsemide  and Aldactone .  3 days of oral Lasix  was added.  She returned to the office on 1/21 for a follow-up visit.  Reported lightheadedness, dizziness and almost passed out after standing up to leave the office.  EMS noted blood sugar level elevated to 532.  Reports she has not been taking her insulin  for the last week.  Not feeling well.  In the ED, patient was afebrile, blood pressure 150/87, heart rate 81, breathing room air. Labs with proBNP 783, Hs-troponins 27 >> 26.  CMP with sodium 131, renal function normal, CBC unremarkable EKG showed sinus rhythm, rate 82 bpm.  RVP unremarkable Chest x-ray unremarkable CT head unremarkable  Patient was given IV Lasix  Admitted to [facility] Cardiology was consulted  Assessment and plan: Acute exacerbation of chronic CHF  Presented with persistent lower extremity edema for the 3 weeks and lightheadedness, dizziness after taking Lasix  for 3 days On exam, noted to have 2+ pedal edema bilaterally Chest x-ray without pulm edema but proBNP elevated to 783 Most recent echo from 06/2024 showed EF of 60 to 65%, mild LVH, normal LV systolic function PTA meds- carvedilol  25 mg twice daily,  valsartan  320 mg daily, torsemide  20 mg daily, Aldactone  50 mg daily, hydralazine  50 mg 3 times daily.  But apparently patient had not been taking her medications for the past few weeks because of cost. Cardiology consulted 1/22, echo showed EF 55 to 60%, no WMA, mild LVH, G1 DD, normal RV size, function and pulmonary artery pressure. Started on IV Lasix  80 mg twice daily as well as irbesartan  300 mg daily and Aldactone  50 mg daily. Not sure if output is clearly documented. Pending RHC LHC today Continue to monitor for daily intake output, weight, blood pressure, BNP, renal function and electrolytes. Net IO Since Admission: -720 mL [10/10/24 0929] Recent Labs  Lab 10/08/24 1155 10/08/24 1258 10/08/24 1259 10/08/24 2129 10/09/24 0334 10/10/24 0612  PROBNP 783.0*  --   --   --   --   --   BUN 13 14  --   --  13 12  CREATININE 0.78 0.80  --   --  0.84 0.76  NA 131* 133* 133*  --  136 136  K 3.7 3.4* 3.4*  --  3.6 3.4*  MG  --   --   --  1.8 1.8  --    Uncontrolled hypertension Has history of uncontrolled hypertension despite being on multiple different medicines as above.  Also complicated by recent noncompliance due to cost. Ultrasound duplex renal artery in 2023 did not show any evidence of renal artery stenosis.  Sleep apnea study was negative 1/20-seen by cardiology Dr. Darron and planned  for renal denervation. Current CHF/HTN meds as above Continue to monitor IV hydralazine  as needed  Elevated troponin  Chest pain  Complains of intermittent chest pain with dyspnea for the last few months  Troponin mildly elevated without ischemic changes and EKG Cardiac CTA on 05/2024 inconclusive Plan for RHC/LHC today  Type 2 diabetes mellitus uncontrolled Peripheral neuropathy A1c 13.9 on 10/10/2024 PTA meds-Lantus  50 units twice daily, Humalog  Premeal 20 units 3 times daily. Reportedly not been taking her medications lately because she states she is not able to afford them.  Apparently all  of her meds are on $4 list. Diabetes care coordinator consult appreciated. Currently on Lantus  25 units twice daily, NovoLog  5 units 3 times daily and SSI/Accu-Cheks.  Blood sugar level remains elevated over 200 this morning.  I will increase Lantus  to 35 m twice daily. Continue gabapentin  300 mg twice daily for neuropathy. Recent Labs  Lab 10/09/24 0002 10/09/24 0742 10/09/24 1143 10/09/24 1655 10/09/24 2204  GLUCAP 278* 326* 309* 182* 210*   H/o CVA  HLD Continue aspirin , Crestor .  Hypokalemia While on diuretics.  Potassium replacement given Continue to monitor  Sore throat Evaluation thus far is negative with a negative group A strep PCR, respiratory panel is negative for flu RSV and COVID, differentials could also be reflux related.  started on PPI empirically Also started Chloraseptic spray    Nutrition Status: Nutrition Problem: Altered nutrition lab value Etiology: chronic illness Signs/Symptoms:  (A1C of 13.5 and CBGs of 182-578 mg/dL in the last 24 hours) Interventions: Education   Mobility: Encourage ambulation  PT Orders:   PT Follow up Rec:     Goals of care   Code Status: Full Code     DVT prophylaxis:  heparin  injection 5,000 Units Start: 10/08/24 1745   Antimicrobials: None Fluid: None Consultants: Cardiology Family Communication: None at bedside  Status: Inpatient Level of care:  Telemetry   Patient is from: Home Needs to continue in-hospital care: Ongoing IV diuresis Anticipated d/c to: Hopefully home in next few days      Diet:  Diet Order             Diet NPO time specified Except for: Sips with Meds  Diet effective 0500                   Scheduled Meds:  aspirin        [MAR Hold] aspirin   81 mg Oral Daily   free water   250 mL Oral Once   [MAR Hold] furosemide   80 mg Intravenous BID   [MAR Hold] gabapentin   300 mg Oral BID   [MAR Hold] heparin   5,000 Units Subcutaneous Q8H   [MAR Hold] insulin  aspart  0-15 Units  Subcutaneous TID WC   [MAR Hold] insulin  aspart  0-5 Units Subcutaneous QHS   [MAR Hold] insulin  aspart  5 Units Subcutaneous TID WC   insulin  glargine  35 Units Subcutaneous BID   [MAR Hold] irbesartan   300 mg Oral Daily   [MAR Hold] nicotine   14 mg Transdermal Daily   [MAR Hold] pantoprazole   40 mg Oral QAC breakfast   potassium chloride  SA       [MAR Hold] rosuvastatin   20 mg Oral Daily   [MAR Hold] sodium chloride  flush  3 mL Intravenous Q12H   [MAR Hold] sodium chloride  flush  3 mL Intravenous Q12H   [MAR Hold] spironolactone   50 mg Oral Daily    PRN meds: [MAR Hold] acetaminophen  **OR** [MAR Hold] acetaminophen ,  aspirin , Heparin  (Porcine) in NaCl, [MAR Hold] hydrALAZINE , [MAR Hold] menthol , [MAR Hold] phenol, potassium chloride  SA, [MAR Hold] sodium chloride  flush   Infusions:     Antimicrobials: Anti-infectives (From admission, onward)    None       Objective: Vitals:   10/09/24 2206 10/10/24 0400  BP: (!) 172/84 (!) 162/88  Pulse: 78 75  Resp: 18 18  Temp: 99 F (37.2 C) 97.6 F (36.4 C)  SpO2: 100% 99%    Intake/Output Summary (Last 24 hours) at 10/10/2024 0929 Last data filed at 10/10/2024 0516 Gross per 24 hour  Intake --  Output 600 ml  Net -600 ml   Filed Weights   10/08/24 1043 10/09/24 0500  Weight: 129.9 kg 125.9 kg   Weight change:  Body mass index is 43.46 kg/m.   Physical Exam: General exam: Pleasant, morbidly obese young African-American female Skin: No rashes, lesions or ulcers. HEENT: Atraumatic, normocephalic, no obvious bleeding Lungs: Clear to auscultation bilaterally,  CVS: S1, S2, no murmur,   GI/Abd: Soft, nontender, nondistended, bowel sound present,   CNS: Alert, awake, oriented x 3 Psychiatry: Mood appropriate Extremities: Bilateral pedal edema, no calf tenderness,  Data Review: I have personally reviewed the laboratory data and studies available.  F/u labs ordered Unresulted Labs (From admission, onward)     Start      Ordered   10/09/24 0500  Occult blood card to lab, stool  Daily,   R      10/08/24 1805   10/08/24 1730  Urinalysis, Routine w reflex microscopic -Urine, Random  Add-on,   AD       Comments: Check for protein due to low albumin.   Question:  Specimen Source  Answer:  Urine, Random   10/08/24 1730            Signed, Chapman Rota, MD [facility] 10/10/2024  "

## 2024-10-10 NOTE — H&P (View-Only) (Signed)
 "   DAILY PROGRESS NOTE   Patient Name: [name] Date of Encounter: 10/10/2024 Cardiologist: Dub Huntsman, DO  Chief Complaint   No chest pain  Patient Profile   Jandy Brackens is a 39 y.o. female with a hx of uncontrolled HTN, history of CVA in October 2023, HFmrEF, HLD type 2 diabetes mellitus, history of tobacco use who is being seen 10/08/2024 for the evaluation of heart failure at the request of Dr. Mario Blanch   Subjective   No issues overnight. Main issue is with sore throat - using lozenges. Not much recorded output. BP remains elevated. Echo yesterday showed LVEF 55-60%, no regional WMA's. RA pressure was low. Small pericardial effusion. BMET pending today.  Objective   Vitals:   10/09/24 0502 10/09/24 1623 10/09/24 2206 10/10/24 0400  BP: (!) 158/86 (!) 161/82 (!) 172/84 (!) 162/88  Pulse: 76 72 78 75  Resp: 17 18 18 18   Temp: 97.6 F (36.4 C) 97.9 F (36.6 C) 99 F (37.2 C) 97.6 F (36.4 C)  TempSrc: Oral     SpO2: 100%  100% 99%  Weight:      Height:        Intake/Output Summary (Last 24 hours) at 10/10/2024 0700 Last data filed at 10/10/2024 0516 Gross per 24 hour  Intake 240 ml  Output 600 ml  Net -360 ml   Filed Weights   10/08/24 1043 10/09/24 0500  Weight: 129.9 kg 125.9 kg    Physical Exam   General appearance: alert and no distress Lungs: clear to auscultation bilaterally Heart: regular rate and rhythm Extremities: edema 1+ bilateral LE Neurologic: Grossly normal  Inpatient Medications    Scheduled Meds:  aspirin   81 mg Oral Daily   free water   250 mL Oral Once   furosemide   80 mg Intravenous BID   gabapentin   300 mg Oral BID   heparin   5,000 Units Subcutaneous Q8H   insulin  aspart  0-15 Units Subcutaneous TID WC   insulin  aspart  0-5 Units Subcutaneous QHS   insulin  aspart  5 Units Subcutaneous TID WC   insulin  glargine  25 Units Subcutaneous BID   irbesartan   300 mg Oral Daily   nicotine   14 mg Transdermal Daily   pantoprazole    40 mg Oral QAC breakfast   rosuvastatin   20 mg Oral Daily   sodium chloride  flush  3 mL Intravenous Q12H   sodium chloride  flush  3 mL Intravenous Q12H   spironolactone   50 mg Oral Daily    Continuous Infusions:    PRN Meds: acetaminophen  **OR** acetaminophen , hydrALAZINE , menthol , phenol, sodium chloride  flush   Labs   Results for orders placed or performed during the hospital encounter of 10/08/24 (from the past 48 hours)  POC CBG, ED     Status: Abnormal   Collection Time: 10/08/24 10:50 AM  Result Value Ref Range   Glucose-Capillary 578 (HH) 70 - 99 mg/dL    Comment: Glucose reference range applies only to samples taken after fasting for at least 8 hours.   Comment 1 Document in Chart   Urinalysis, Routine w reflex microscopic -Urine, Clean Catch     Status: Abnormal   Collection Time: 10/08/24 11:42 AM  Result Value Ref Range   Color, Urine STRAW (A) YELLOW   APPearance CLEAR CLEAR   Specific Gravity, Urine 1.011 1.005 - 1.030   pH 6.0 5.0 - 8.0   Glucose, UA >=500 (A) NEGATIVE mg/dL   Hgb urine dipstick MODERATE (A) NEGATIVE  Bilirubin Urine NEGATIVE NEGATIVE   Ketones, ur NEGATIVE NEGATIVE mg/dL   Protein, ur 899 (A) NEGATIVE mg/dL   Nitrite NEGATIVE NEGATIVE   Leukocytes,Ua NEGATIVE NEGATIVE   RBC / HPF 0-5 0 - 5 RBC/hpf   WBC, UA 0-5 0 - 5 WBC/hpf   Bacteria, UA NONE SEEN NONE SEEN   Squamous Epithelial / HPF 0-5 0 - 5 /HPF    Comment: Performed at [facility], [address], [place], [place] [address]  CBC with Differential     Status: Abnormal   Collection Time: 10/08/24 11:55 AM  Result Value Ref Range   WBC 7.5 4.0 - 10.5 K/uL   RBC 4.34 3.87 - 5.11 MIL/uL   Hemoglobin 11.9 (L) 12.0 - 15.0 g/dL   HCT 64.2 (L) 63.9 - 53.9 %   MCV 82.3 80.0 - 100.0 fL   MCH 27.4 26.0 - 34.0 pg   MCHC 33.3 30.0 - 36.0 g/dL   RDW 87.1 88.4 - 84.4 %   Platelets 315 150 - 400 K/uL   nRBC 0.0 0.0 - 0.2 %   Neutrophils Relative % 55 %   Neutro Abs 4.2 1.7 - 7.7  K/uL   Lymphocytes Relative 36 %   Lymphs Abs 2.7 0.7 - 4.0 K/uL   Monocytes Relative 6 %   Monocytes Absolute 0.5 0.1 - 1.0 K/uL   Eosinophils Relative 2 %   Eosinophils Absolute 0.1 0.0 - 0.5 K/uL   Basophils Relative 0 %   Basophils Absolute 0.0 0.0 - 0.1 K/uL   Immature Granulocytes 1 %   Abs Immature Granulocytes 0.04 0.00 - 0.07 K/uL    Comment: Performed at [facility], [address] [place], [place] [address]  Comprehensive metabolic panel     Status: Abnormal   Collection Time: 10/08/24 11:55 AM  Result Value Ref Range   Sodium 131 (L) 135 - 145 mmol/L   Potassium 3.7 3.5 - 5.1 mmol/L    Comment: HEMOLYSIS AT THIS LEVEL MAY AFFECT RESULT   Chloride 95 (L) 98 - 111 mmol/L   CO2 25 22 - 32 mmol/L   Glucose, Bld 474 (H) 70 - 99 mg/dL    Comment: Glucose reference range applies only to samples taken after fasting for at least 8 hours.   BUN 13 6 - 20 mg/dL   Creatinine, Ser 9.21 0.44 - 1.00 mg/dL   Calcium  8.3 (L) 8.9 - 10.3 mg/dL   Total Protein 7.1 6.5 - 8.1 g/dL   Albumin 3.0 (L) 3.5 - 5.0 g/dL   AST 27 15 - 41 U/L    Comment: HEMOLYSIS AT THIS LEVEL MAY AFFECT RESULT   ALT 19 0 - 44 U/L   Alkaline Phosphatase 135 (H) 38 - 126 U/L   Total Bilirubin 0.2 0.0 - 1.2 mg/dL   GFR, Estimated >39 >39 mL/min    Comment: (NOTE) Calculated using the CKD-EPI Creatinine Equation (2021)    Anion gap 11 5 - 15    Comment: Performed at [facility], [address], [place], [place] [address]  Beta-hydroxybutyric acid     Status: None   Collection Time: 10/08/24 11:55 AM  Result Value Ref Range   Beta-Hydroxybutyric Acid 0.12 0.05 - 0.27 mmol/L    Comment: (NOTE) This is a modified FDA-approved test that has been validated and its performance characteristics determined by the reporting laboratory. This laboratory is certified under the Clinical Laboratory Improvement Amendments (CLIA) as qualified to perform high complexity  clinical laboratory  testing.  Performed at [facility], [address], [place], [place] [address]   Pro Brain natriuretic peptide     Status: Abnormal   Collection Time: 10/08/24 11:55 AM  Result Value Ref Range   Pro Brain Natriuretic Peptide 783.0 (H) <300.0 pg/mL    Comment: (NOTE) Age Group        Cut-Points    Interpretation  < 50 years     450 pg/mL       NT-proBNP > 450 pg/mL indicates                                ADHF is likely              50 to 75 years  900 pg/mL      NT-proBNP > 900 pg/mL indicates          ADHF is likely  > 75 years      1800 pg/mL     NT-proBNP > 1800 pg/mL indicates          ADHF is likely                           All ages    Results between       Indeterminate. Further clinical             300 and the cut-   information is needed to determine            point for age group   if ADHF is present.                                                             Elecsys proBNP II/ Elecsys proBNP II STAT           Cut-Point                       Interpretation  300 pg/mL                    NT-proBNP <300pg/mL indicates                             ADHF is not likely  Performed at [facility], [address] [place], [place] [address]   Troponin T, High Sensitivity     Status: Abnormal   Collection Time: 10/08/24 11:55 AM  Result Value Ref Range   Troponin T High Sensitivity 27 (H) 0 - 19 ng/L    Comment: (NOTE) Biotin concentrations > 1000 ng/mL falsely decrease TnT results.  Serial cardiac troponin measurements are suggested.  Refer to the Links section for chest pain algorithms and additional  guidance. Performed at [facility], [address], [place], [place] [address]   Resp panel by RT-PCR (RSV, Flu A&B, Covid) Urine, Clean Catch     Status: None   Collection Time: 10/08/24 11:55 AM   Specimen: Urine, Clean Catch; Nasal Swab  Result Value Ref Range   SARS Coronavirus 2 by RT PCR NEGATIVE NEGATIVE   Influenza A by PCR NEGATIVE  NEGATIVE  Influenza B by PCR NEGATIVE NEGATIVE    Comment: (NOTE) The Xpert Xpress SARS-CoV-2/FLU/RSV plus assay is intended as an aid in the diagnosis of influenza from Nasopharyngeal swab specimens and should not be used as a sole basis for treatment. Nasal washings and aspirates are unacceptable for Xpert Xpress SARS-CoV-2/FLU/RSV testing.  Fact Sheet for Patients: bloggercourse.com  Fact Sheet for Healthcare Providers: seriousbroker.it  This test is not yet approved or cleared by the [place]  FDA and has been authorized for detection and/or diagnosis of SARS-CoV-2 by FDA under an Emergency Use Authorization (EUA). This EUA will remain in effect (meaning this test can be used) for the duration of the COVID-19 declaration under Section 564(b)(1) of the Act, 21 U.S.C. section 360bbb-3(b)(1), unless the authorization is terminated or revoked.     Resp Syncytial Virus by PCR NEGATIVE NEGATIVE    Comment: (NOTE) Fact Sheet for Patients: bloggercourse.com  Fact Sheet for Healthcare Providers: seriousbroker.it  This test is not yet approved or cleared by the [place]  FDA and has been authorized for detection and/or diagnosis of SARS-CoV-2 by FDA under an Emergency Use Authorization (EUA). This EUA will remain in effect (meaning this test can be used) for the duration of the COVID-19 declaration under Section 564(b)(1) of the Act, 21 U.S.C. section 360bbb-3(b)(1), unless the authorization is terminated or revoked.  Performed at [facility], [address], [place], [place] [address]   Group A Strep by PCR     Status: None   Collection Time: 10/08/24 11:55 AM   Specimen: Urine, Clean Catch; Sterile Swab  Result Value Ref Range   Group A Strep by PCR NOT DETECTED NOT DETECTED    Comment: Performed at [facility], [address] [place], [place] [address]   Lipase, blood     Status: Abnormal   Collection Time: 10/08/24 11:55 AM  Result Value Ref Range   Lipase 60 (H) 11 - 51 U/L    Comment: Performed at [facility], [address], [place], [place] [address]  hCG, serum, qualitative     Status: None   Collection Time: 10/08/24 11:55 AM  Result Value Ref Range   Preg, Serum NEGATIVE NEGATIVE    Comment:        THE SENSITIVITY OF THIS METHODOLOGY IS >10 mIU/mL. Performed at [facility], [address] [place], [place] [address]   Troponin T, High Sensitivity     Status: Abnormal   Collection Time: 10/08/24 12:45 PM  Result Value Ref Range   Troponin T High Sensitivity 26 (H) 0 - 19 ng/L    Comment: (NOTE) Biotin concentrations > 1000 ng/mL falsely decrease TnT results.  Serial cardiac troponin measurements are suggested.  Refer to the Links section for chest pain algorithms and additional  guidance. Performed at [facility], [address], [place], [place] [address]   I-stat chem 8, ED (not at [facility], [facility] or [facility])     Status: Abnormal   Collection Time: 10/08/24 12:58 PM  Result Value Ref Range   Sodium 133 (L) 135 - 145 mmol/L   Potassium 3.4 (L) 3.5 - 5.1 mmol/L   Chloride 97 (L) 98 - 111 mmol/L   BUN 14 6 - 20 mg/dL   Creatinine, Ser 9.19 0.44 - 1.00 mg/dL   Glucose, Bld 526 (H) 70 - 99 mg/dL    Comment: Glucose reference range applies only to samples taken after fasting for at least 8 hours.  Calcium , Ion 1.06 (L) 1.15 - 1.40 mmol/L   TCO2 24 22 - 32 mmol/L   Hemoglobin 12.6 12.0 - 15.0 g/dL   HCT 62.9 63.9 - 53.9 %  I-Stat venous blood gas, ([facility] ED, [facility], [facility])     Status: Abnormal   Collection Time: 10/08/24 12:59 PM  Result Value Ref Range   pH, Ven 7.406 7.25 - 7.43   pCO2, Ven 42.2 (L) 44 - 60 mmHg   pO2, Ven 42 32 - 45 mmHg   Bicarbonate 26.5 20.0 - 28.0 mmol/L   TCO2 28 22 - 32 mmol/L   O2 Saturation 78 %   Acid-Base Excess 2.0 0.0 - 2.0 mmol/L   Sodium 133 (L) 135 - 145 mmol/L   Potassium  3.4 (L) 3.5 - 5.1 mmol/L   Calcium , Ion 1.03 (L) 1.15 - 1.40 mmol/L   HCT 36.0 36.0 - 46.0 %   Hemoglobin 12.2 12.0 - 15.0 g/dL   Sample type VENOUS   POC CBG, ED     Status: Abnormal   Collection Time: 10/08/24  2:46 PM  Result Value Ref Range   Glucose-Capillary 374 (H) 70 - 99 mg/dL    Comment: Glucose reference range applies only to samples taken after fasting for at least 8 hours.  Type and screen     Status: None   Collection Time: 10/08/24  9:28 PM  Result Value Ref Range   ABO/RH(D) B POS    Antibody Screen NEG    Sample Expiration      10/11/2024,2359 Performed at [facility], [address] [place], [place] [address]   Magnesium      Status: None   Collection Time: 10/08/24  9:29 PM  Result Value Ref Range   Magnesium  1.8 1.7 - 2.4 mg/dL    Comment: Performed at [facility], [address], [place], [place] [address]  D-dimer, quantitative     Status: Abnormal   Collection Time: 10/08/24  9:29 PM  Result Value Ref Range   D-Dimer, Quant 1.24 (H) 0.00 - 0.50 ug/mL-FEU    Comment: (NOTE) At the manufacturer cut-off value of 0.5 g/mL FEU, this assay has a negative predictive value of 95-100%.This assay is intended for use in conjunction with a clinical pretest probability (PTP) assessment model to exclude pulmonary embolism (PE) and deep venous thrombosis (DVT) in outpatients suspected of PE or DVT. Results should be correlated with clinical presentation. Performed at [facility], [address], [place], [place] [address]   CBC     Status: Abnormal   Collection Time: 10/08/24  9:29 PM  Result Value Ref Range   WBC 7.0 4.0 - 10.5 K/uL   RBC 4.34 3.87 - 5.11 MIL/uL   Hemoglobin 11.8 (L) 12.0 - 15.0 g/dL   HCT 63.8 63.9 - 53.9 %   MCV 83.2 80.0 - 100.0 fL   MCH 27.2 26.0 - 34.0 pg   MCHC 32.7 30.0 - 36.0 g/dL   RDW 86.9 88.4 - 84.4 %   Platelets 337 150 - 400 K/uL   nRBC 0.0 0.0 - 0.2 %    Comment: Performed at [facility],  [address], [place], [place] [address]  T4, free     Status: None   Collection Time: 10/08/24  9:29 PM  Result Value Ref Range   Free T4 1.25 0.80 - 2.00 ng/dL    Comment: Performed at [facility], [address], [place], [place] [address]  TSH     Status: None   Collection Time: 10/08/24  9:29 PM  Result Value Ref Range   TSH 0.372 0.350 - 4.500 uIU/mL    Comment: Performed at [facility], [address] [place], [place] [address]  CBG monitoring, ED     Status: Abnormal   Collection Time: 10/09/24 12:02 AM  Result Value Ref Range   Glucose-Capillary 278 (H) 70 - 99 mg/dL    Comment: Glucose reference range applies only to samples taken after fasting for at least 8 hours.  CBC     Status: None   Collection Time: 10/09/24  3:34 AM  Result Value Ref Range   WBC 7.4 4.0 - 10.5 K/uL   RBC 4.48 3.87 - 5.11 MIL/uL   Hemoglobin 12.1 12.0 - 15.0 g/dL   HCT 62.9 63.9 - 53.9 %   MCV 82.6 80.0 - 100.0 fL   MCH 27.0 26.0 - 34.0 pg   MCHC 32.7 30.0 - 36.0 g/dL   RDW 86.8 88.4 - 84.4 %   Platelets 337 150 - 400 K/uL   nRBC 0.0 0.0 - 0.2 %    Comment: Performed at [facility], [address] [place], [place] [address]  Comprehensive metabolic panel     Status: Abnormal   Collection Time: 10/09/24  3:34 AM  Result Value Ref Range   Sodium 136 135 - 145 mmol/L   Potassium 3.6 3.5 - 5.1 mmol/L   Chloride 100 98 - 111 mmol/L   CO2 24 22 - 32 mmol/L   Glucose, Bld 292 (H) 70 - 99 mg/dL    Comment: Glucose reference range applies only to samples taken after fasting for at least 8 hours.   BUN 13 6 - 20 mg/dL   Creatinine, Ser 9.15 0.44 - 1.00 mg/dL   Calcium  8.4 (L) 8.9 - 10.3 mg/dL   Total Protein 6.8 6.5 - 8.1 g/dL   Albumin 2.9 (L) 3.5 - 5.0 g/dL   AST 20 15 - 41 U/L   ALT 18 0 - 44 U/L   Alkaline Phosphatase 97 38 - 126 U/L   Total Bilirubin 0.2 0.0 - 1.2 mg/dL   GFR, Estimated >39 >39 mL/min    Comment: (NOTE) Calculated using the CKD-EPI Creatinine Equation  (2021)    Anion gap 12 5 - 15    Comment: Performed at [facility], [address], [place], [place] [address]  Magnesium      Status: None   Collection Time: 10/09/24  3:34 AM  Result Value Ref Range   Magnesium  1.8 1.7 - 2.4 mg/dL    Comment: Performed at [facility], [address] [place], [place] [address]  Glucose, capillary     Status: Abnormal   Collection Time: 10/09/24  7:42 AM  Result Value Ref Range   Glucose-Capillary 326 (H) 70 - 99 mg/dL    Comment: Glucose reference range applies only to samples taken after fasting for at least 8 hours.  Glucose, capillary     Status: Abnormal   Collection Time: 10/09/24 11:43 AM  Result Value Ref Range   Glucose-Capillary 309 (H) 70 - 99 mg/dL    Comment: Glucose reference range applies only to samples taken after fasting for at least 8 hours.  Glucose, capillary     Status: Abnormal   Collection Time: 10/09/24  4:55 PM  Result Value Ref Range   Glucose-Capillary 182 (H) 70 - 99 mg/dL  Comment: Glucose reference range applies only to samples taken after fasting for at least 8 hours.  Glucose, capillary     Status: Abnormal   Collection Time: 10/09/24 10:04 PM  Result Value Ref Range   Glucose-Capillary 210 (H) 70 - 99 mg/dL    Comment: Glucose reference range applies only to samples taken after fasting for at least 8 hours.  CBC with Differential/Platelet     Status: Abnormal   Collection Time: 10/10/24  6:12 AM  Result Value Ref Range   WBC 7.7 4.0 - 10.5 K/uL   RBC 4.19 3.87 - 5.11 MIL/uL   Hemoglobin 11.5 (L) 12.0 - 15.0 g/dL   HCT 65.1 (L) 63.9 - 53.9 %   MCV 83.1 80.0 - 100.0 fL   MCH 27.4 26.0 - 34.0 pg   MCHC 33.0 30.0 - 36.0 g/dL   RDW 86.7 88.4 - 84.4 %   Platelets 317 150 - 400 K/uL   nRBC 0.0 0.0 - 0.2 %   Neutrophils Relative % 41 %   Neutro Abs 3.2 1.7 - 7.7 K/uL   Lymphocytes Relative 48 %   Lymphs Abs 3.7 0.7 - 4.0 K/uL   Monocytes Relative 8 %   Monocytes Absolute 0.6 0.1 - 1.0 K/uL    Eosinophils Relative 2 %   Eosinophils Absolute 0.1 0.0 - 0.5 K/uL   Basophils Relative 0 %   Basophils Absolute 0.0 0.0 - 0.1 K/uL   Immature Granulocytes 1 %   Abs Immature Granulocytes 0.04 0.00 - 0.07 K/uL    Comment: Performed at [facility], [address], [place], [place] [address]    ECG   N/A  Telemetry   Sinus rhythm - Personally Reviewed  Radiology    ECHOCARDIOGRAM COMPLETE Result Date: 10/09/2024    ECHOCARDIOGRAM REPORT   Patient Name:   LILYANNE MCQUOWN Date of Exam: 10/09/2024 Medical Rec #:  [id number]    Height:       67.0 in Accession #:    [id number]   Weight:       277.5 lb Date of Birth:  Jan 18, 1986    BSA:          2.324 m Patient Age:    38 years     BP:           158/86 mmHg Patient Gender: F            HR:           80 bpm. Exam Location:  Inpatient Procedure: 2D Echo, Cardiac Doppler and Color Doppler (Both Spectral and Color            Flow Doppler were utilized during procedure). Indications:    Congestive Heart Failure  History:        Patient has prior history of Echocardiogram examinations, most                 recent 07/17/2024. CHF; Risk Factors:Hypertension and Diabetes.  Sonographer:    Sherlean Dubin Referring Phys: [id number] [facility] K LE  Sonographer Comments: Image acquisition challenging due to respiratory motion and Image acquisition challenging due to patient body habitus. IMPRESSIONS  1. Left ventricular ejection fraction, by estimation, is 55 to 60%. The left ventricle has normal function. The left ventricle has no regional wall motion abnormalities. There is mild left ventricular hypertrophy. Left ventricular diastolic parameters are consistent with Grade I diastolic dysfunction (impaired relaxation).  2. Right ventricular systolic function is normal. The right ventricular size is  normal. There is normal pulmonary artery systolic pressure. The estimated right ventricular systolic pressure is 9.2 mmHg.  3. A small pericardial effusion is present. The  pericardial effusion is circumferential. There is no evidence of cardiac tamponade.  4. The mitral valve is normal in structure. No evidence of mitral valve regurgitation. No evidence of mitral stenosis.  5. The aortic valve is tricuspid. Aortic valve regurgitation is not visualized. No aortic stenosis is present.  6. The inferior vena cava is normal in size with greater than 50% respiratory variability, suggesting right atrial pressure of 3 mmHg. Comparison(s): A prior study was performed on 07/17/2024. LVEF remains preserved, LV cavity no longer dilated, no change in size of pericardial effusion. FINDINGS  Left Ventricle: Left ventricular ejection fraction, by estimation, is 55 to 60%. The left ventricle has normal function. The left ventricle has no regional wall motion abnormalities. The left ventricular internal cavity size was normal in size. There is  mild left ventricular hypertrophy. Left ventricular diastolic parameters are consistent with Grade I diastolic dysfunction (impaired relaxation). Right Ventricle: The right ventricular size is normal. No increase in right ventricular wall thickness. Right ventricular systolic function is normal. There is normal pulmonary artery systolic pressure. The tricuspid regurgitant velocity is 1.25 m/s, and  with an assumed right atrial pressure of 3 mmHg, the estimated right ventricular systolic pressure is 9.2 mmHg. Left Atrium: Left atrial size was normal in size. Right Atrium: Right atrial size was normal in size. Pericardium: A small pericardial effusion is present. The pericardial effusion is circumferential. There is no evidence of cardiac tamponade. Mitral Valve: The mitral valve is normal in structure. No evidence of mitral valve regurgitation. No evidence of mitral valve stenosis. Tricuspid Valve: The tricuspid valve is normal in structure. Tricuspid valve regurgitation is trivial. No evidence of tricuspid stenosis. Aortic Valve: The aortic valve is tricuspid.  Aortic valve regurgitation is not visualized. No aortic stenosis is present. Aortic valve mean gradient measures 5.5 mmHg. Aortic valve peak gradient measures 10.7 mmHg. Aortic valve area, by VTI measures 1.93  cm. Pulmonic Valve: The pulmonic valve was normal in structure. Pulmonic valve regurgitation is not visualized. No evidence of pulmonic stenosis. Aorta: The aortic root and ascending aorta are structurally normal, with no evidence of dilitation. Venous: The inferior vena cava is normal in size with greater than 50% respiratory variability, suggesting right atrial pressure of 3 mmHg. IAS/Shunts: No atrial level shunt detected by color flow Doppler.  LEFT VENTRICLE PLAX 2D LVIDd:         5.00 cm   Diastology LVIDs:         3.40 cm   LV e' medial:    3.50 cm/s LV PW:         1.40 cm   LV E/e' medial:  15.6 LV IVS:        1.30 cm   LV e' lateral:   5.00 cm/s LVOT diam:     2.00 cm   LV E/e' lateral: 10.9 LV SV:         57 LV SV Index:   25 LVOT Area:     3.14 cm  RIGHT VENTRICLE             IVC RV Basal diam:  3.20 cm     IVC diam: 1.60 cm RV Mid diam:    2.60 cm RV S prime:     11.70 cm/s TAPSE (M-mode): 1.8 cm LEFT ATRIUM  Index        RIGHT ATRIUM           Index LA diam:        5.00 cm 2.15 cm/m   RA Area:     17.70 cm LA Vol (A2C):   66.4 ml 28.57 ml/m  RA Volume:   48.50 ml  20.87 ml/m LA Vol (A4C):   67.4 ml 29.00 ml/m LA Biplane Vol: 68.4 ml 29.43 ml/m  AORTIC VALVE AV Area (Vmax):    1.94 cm AV Area (Vmean):   1.85 cm AV Area (VTI):     1.93 cm AV Vmax:           163.50 cm/s AV Vmean:          106.000 cm/s AV VTI:            0.295 m AV Peak Grad:      10.7 mmHg AV Mean Grad:      5.5 mmHg LVOT Vmax:         100.85 cm/s LVOT Vmean:        62.550 cm/s LVOT VTI:          0.182 m LVOT/AV VTI ratio: 0.62  AORTA Ao Root diam: 2.70 cm Ao Asc diam:  3.50 cm MITRAL VALVE               TRICUSPID VALVE MV Area (PHT): 3.31 cm    TR Peak grad:   6.2 mmHg MV Decel Time: 229 msec    TR Vmax:         125.00 cm/s MV E velocity: 54.50 cm/s MV A velocity: 64.40 cm/s  SHUNTS MV E/A ratio:  0.85        Systemic VTI:  0.18 m                            Systemic Diam: 2.00 cm Sunit Tolia Electronically signed by Madonna Large Signature Date/Time: 10/09/2024/1:38:56 PM    Final    MR BRAIN WO CONTRAST Result Date: 10/08/2024 EXAM: MRI Brain Without Contrast 10/08/2024 08:46:02 PM TECHNIQUE: Multiplanar multisequence MRI of the head/brain was performed without the administration of intravenous contrast. COMPARISON: None available. CLINICAL HISTORY: Syncope/presyncope, cerebrovascular cause suspected FINDINGS: BRAIN AND VENTRICLES: No acute infarct. Remote left parietal cortical infarct. No intracranial hemorrhage. No mass. No midline shift. No hydrocephalus. The sella is unremarkable. Normal flow voids. ORBITS: No significant abnormality. SINUSES AND MASTOIDS: No significant abnormality. BONES AND SOFT TISSUES: Normal marrow signal. No soft tissue abnormality. IMPRESSION: 1. No acute abnormality. 2. Remote left parietal cortical infarct. Electronically signed by: Glendia Molt MD 10/08/2024 08:55 PM EST RP Workstation: [id number]   CT Head Wo Contrast Result Date: 10/08/2024 CLINICAL DATA:  Near syncopal episode. EXAM: CT HEAD WITHOUT CONTRAST TECHNIQUE: Contiguous axial images were obtained from the base of the skull through the vertex without intravenous contrast. RADIATION DOSE REDUCTION: This exam was performed according to the departmental dose-optimization program which includes automated exposure control, adjustment of the mA and/or kV according to patient size and/or use of iterative reconstruction technique. COMPARISON:  01/30/2023 FINDINGS: Brain: No evidence of acute infarction, hemorrhage, hydrocephalus, extra-axial collection or mass lesion/mass effect. Vascular: No hyperdense vessel or unexpected calcification. Skull: Normal. Negative for fracture or focal lesion. Sinuses/Orbits: No acute finding. Other:  None. IMPRESSION: No acute intracranial pathology. Electronically Signed   By: Toribio Agreste M.D.   On: 10/08/2024 15:39   DG Chest  Portable 1 View Result Date: 10/08/2024 CLINICAL DATA:  Hyperglycemia with possible CHF exacerbation. EXAM: PORTABLE CHEST 1 VIEW COMPARISON:  July 15, 2024 FINDINGS: The cardiac silhouette is mildly enlarged and unchanged in size. Low lung volumes are noted. No acute infiltrate, pleural effusion or pneumothorax is identified. The visualized skeletal structures are unremarkable. IMPRESSION: Low lung volumes without acute or active cardiopulmonary disease. Electronically Signed   By: Suzen Dials M.D.   On: 10/08/2024 13:33    Cardiac Studies   Echo pending  Assessment   Principal Problem:   Acute diastolic (congestive) heart failure (HCC) Active Problems:   Dizziness   Plan   Not much recorded output overnight. Says she has flushed urine when she has a BM - both yesterday and today. Denies any diarrhea. Echo shows normal LVEF, small pericardial effusion. RA pressure low. May be able to stop diuretics today, especially if creatinine elevated based on RHC findings. Plan for [facility] today.  Informed Consent   Shared Decision Making/Informed Consent The risks [stroke (1 in 1000), death (1 in 1000), kidney failure [usually temporary] (1 in 500), bleeding (1 in 200), allergic reaction [possibly serious] (1 in 200)], benefits (diagnostic support and management of coronary artery disease) and alternatives of a cardiac catheterization were discussed in detail with Ms. Solanki and she is willing to proceed.     Time Spent Directly with Patient:  I have spent a total of 25 minutes with the patient reviewing hospital notes, telemetry, EKGs, labs and examining the patient as well as establishing an assessment and plan that was discussed personally with the patient.  > 50% of time was spent in direct patient care.  Length of Stay:  LOS: 2 days   Vinie KYM Maxcy,  MD, [facility], FNLA, FACP  [place]  [facility]  Medical Director of the [facility]  [facility] Reduction [facility] Diplomate of the American Board of Clinical Lipidology Attending Cardiologist  Direct Dial : [phone]  Fax: [phone]  Website:  [email or web address] Vinie BROCKS Kasten Leveque 10/10/2024, 7:00 AM   "

## 2024-10-10 NOTE — Progress Notes (Signed)
 Heart Failure Navigator Progress Note  Assessed for Heart & Vascular TOC clinic readiness.  Patient does not meet criteria due to scheduled [facility] appt 2/3, EF 55-60%. Will not schedule HF TOC appt at this time.   Navigator available for reassessment of patient.   Duwaine Plant, PharmD, BCPS Heart Failure Stewardship Pharmacist Phone [phone]

## 2024-10-10 NOTE — Interval H&P Note (Signed)
 History and Physical Interval Note:  10/10/2024 9:49 AM  Carolyn Zimmerman  has presented today for surgery, with the diagnosis of chest pain - chf.  The various methods of treatment have been discussed with the patient and family. After consideration of risks, benefits and other options for treatment, the patient has consented to  Procedures: RIGHT/LEFT HEART CATH AND CORONARY ANGIOGRAPHY (N/A) and possible coronary angioplasty for acute decompensated diastolic heart failure and high risk cardiac risk factors as a surgical intervention.  The patient's history has been reviewed, patient examined, no change in status, stable for surgery.  I have reviewed the patient's chart and labs.  Questions were answered to the patient's satisfaction.     Gordy Bergamo

## 2024-10-10 NOTE — Progress Notes (Signed)
 "   DAILY PROGRESS NOTE   Patient Name: Carolyn Zimmerman Date of Encounter: 10/10/2024 Cardiologist: Dub Huntsman, DO  Chief Complaint   No chest pain  Patient Profile   Carolyn Zimmerman is a 39 y.o. female with a hx of uncontrolled HTN, history of CVA in October 2023, HFmrEF, HLD type 2 diabetes mellitus, history of tobacco use who is being seen 10/08/2024 for the evaluation of heart failure at the request of Dr. Mario Blanch   Subjective   No issues overnight. Main issue is with sore throat - using lozenges. Not much recorded output. BP remains elevated. Echo yesterday showed LVEF 55-60%, no regional WMA's. RA pressure was low. Small pericardial effusion. BMET pending today.  Objective   Vitals:   10/09/24 0502 10/09/24 1623 10/09/24 2206 10/10/24 0400  BP: (!) 158/86 (!) 161/82 (!) 172/84 (!) 162/88  Pulse: 76 72 78 75  Resp: 17 18 18 18   Temp: 97.6 F (36.4 C) 97.9 F (36.6 C) 99 F (37.2 C) 97.6 F (36.4 C)  TempSrc: Oral     SpO2: 100%  100% 99%  Weight:      Height:        Intake/Output Summary (Last 24 hours) at 10/10/2024 0700 Last data filed at 10/10/2024 0516 Gross per 24 hour  Intake 240 ml  Output 600 ml  Net -360 ml   Filed Weights   10/08/24 1043 10/09/24 0500  Weight: 129.9 kg 125.9 kg    Physical Exam   General appearance: alert and no distress Lungs: clear to auscultation bilaterally Heart: regular rate and rhythm Extremities: edema 1+ bilateral LE Neurologic: Grossly normal  Inpatient Medications    Scheduled Meds:  aspirin   81 mg Oral Daily   free water   250 mL Oral Once   furosemide   80 mg Intravenous BID   gabapentin   300 mg Oral BID   heparin   5,000 Units Subcutaneous Q8H   insulin  aspart  0-15 Units Subcutaneous TID WC   insulin  aspart  0-5 Units Subcutaneous QHS   insulin  aspart  5 Units Subcutaneous TID WC   insulin  glargine  25 Units Subcutaneous BID   irbesartan   300 mg Oral Daily   nicotine   14 mg Transdermal Daily   pantoprazole    40 mg Oral QAC breakfast   rosuvastatin   20 mg Oral Daily   sodium chloride  flush  3 mL Intravenous Q12H   sodium chloride  flush  3 mL Intravenous Q12H   spironolactone   50 mg Oral Daily    Continuous Infusions:    PRN Meds: acetaminophen  **OR** acetaminophen , hydrALAZINE , menthol , phenol, sodium chloride  flush   Labs   Results for orders placed or performed during the hospital encounter of 10/08/24 (from the past 48 hours)  POC CBG, ED     Status: Abnormal   Collection Time: 10/08/24 10:50 AM  Result Value Ref Range   Glucose-Capillary 578 (HH) 70 - 99 mg/dL    Comment: Glucose reference range applies only to samples taken after fasting for at least 8 hours.   Comment 1 Document in Chart   Urinalysis, Routine w reflex microscopic -Urine, Clean Catch     Status: Abnormal   Collection Time: 10/08/24 11:42 AM  Result Value Ref Range   Color, Urine STRAW (A) YELLOW   APPearance CLEAR CLEAR   Specific Gravity, Urine 1.011 1.005 - 1.030   pH 6.0 5.0 - 8.0   Glucose, UA >=500 (A) NEGATIVE mg/dL   Hgb urine dipstick MODERATE (A) NEGATIVE  Bilirubin Urine NEGATIVE NEGATIVE   Ketones, ur NEGATIVE NEGATIVE mg/dL   Protein, ur 899 (A) NEGATIVE mg/dL   Nitrite NEGATIVE NEGATIVE   Leukocytes,Ua NEGATIVE NEGATIVE   RBC / HPF 0-5 0 - 5 RBC/hpf   WBC, UA 0-5 0 - 5 WBC/hpf   Bacteria, UA NONE SEEN NONE SEEN   Squamous Epithelial / HPF 0-5 0 - 5 /HPF    Comment: Performed at [facility], [address], [place], [place] [address]  CBC with Differential     Status: Abnormal   Collection Time: 10/08/24 11:55 AM  Result Value Ref Range   WBC 7.5 4.0 - 10.5 K/uL   RBC 4.34 3.87 - 5.11 MIL/uL   Hemoglobin 11.9 (L) 12.0 - 15.0 g/dL   HCT 64.2 (L) 63.9 - 53.9 %   MCV 82.3 80.0 - 100.0 fL   MCH 27.4 26.0 - 34.0 pg   MCHC 33.3 30.0 - 36.0 g/dL   RDW 87.1 88.4 - 84.4 %   Platelets 315 150 - 400 K/uL   nRBC 0.0 0.0 - 0.2 %   Neutrophils Relative % 55 %   Neutro Abs 4.2 1.7 - 7.7  K/uL   Lymphocytes Relative 36 %   Lymphs Abs 2.7 0.7 - 4.0 K/uL   Monocytes Relative 6 %   Monocytes Absolute 0.5 0.1 - 1.0 K/uL   Eosinophils Relative 2 %   Eosinophils Absolute 0.1 0.0 - 0.5 K/uL   Basophils Relative 0 %   Basophils Absolute 0.0 0.0 - 0.1 K/uL   Immature Granulocytes 1 %   Abs Immature Granulocytes 0.04 0.00 - 0.07 K/uL    Comment: Performed at [facility], [address] [place], [place] [address]  Comprehensive metabolic panel     Status: Abnormal   Collection Time: 10/08/24 11:55 AM  Result Value Ref Range   Sodium 131 (L) 135 - 145 mmol/L   Potassium 3.7 3.5 - 5.1 mmol/L    Comment: HEMOLYSIS AT THIS LEVEL MAY AFFECT RESULT   Chloride 95 (L) 98 - 111 mmol/L   CO2 25 22 - 32 mmol/L   Glucose, Bld 474 (H) 70 - 99 mg/dL    Comment: Glucose reference range applies only to samples taken after fasting for at least 8 hours.   BUN 13 6 - 20 mg/dL   Creatinine, Ser 9.21 0.44 - 1.00 mg/dL   Calcium  8.3 (L) 8.9 - 10.3 mg/dL   Total Protein 7.1 6.5 - 8.1 g/dL   Albumin 3.0 (L) 3.5 - 5.0 g/dL   AST 27 15 - 41 U/L    Comment: HEMOLYSIS AT THIS LEVEL MAY AFFECT RESULT   ALT 19 0 - 44 U/L   Alkaline Phosphatase 135 (H) 38 - 126 U/L   Total Bilirubin 0.2 0.0 - 1.2 mg/dL   GFR, Estimated >39 >39 mL/min    Comment: (NOTE) Calculated using the CKD-EPI Creatinine Equation (2021)    Anion gap 11 5 - 15    Comment: Performed at [facility], [address], [place], [place] [address]  Beta-hydroxybutyric acid     Status: None   Collection Time: 10/08/24 11:55 AM  Result Value Ref Range   Beta-Hydroxybutyric Acid 0.12 0.05 - 0.27 mmol/L    Comment: (NOTE) This is a modified FDA-approved test that has been validated and its performance characteristics determined by the reporting laboratory. This laboratory is certified under the Clinical Laboratory Improvement Amendments (CLIA) as qualified to perform high complexity  clinical laboratory  testing.  Performed at [facility], [address], [place], [place] [address]   Pro Brain natriuretic peptide     Status: Abnormal   Collection Time: 10/08/24 11:55 AM  Result Value Ref Range   Pro Brain Natriuretic Peptide 783.0 (H) <300.0 pg/mL    Comment: (NOTE) Age Group        Cut-Points    Interpretation  < 50 years     450 pg/mL       NT-proBNP > 450 pg/mL indicates                                ADHF is likely              50 to 75 years  900 pg/mL      NT-proBNP > 900 pg/mL indicates          ADHF is likely  > 75 years      1800 pg/mL     NT-proBNP > 1800 pg/mL indicates          ADHF is likely                           All ages    Results between       Indeterminate. Further clinical             300 and the cut-   information is needed to determine            point for age group   if ADHF is present.                                                             Elecsys proBNP II/ Elecsys proBNP II STAT           Cut-Point                       Interpretation  300 pg/mL                    NT-proBNP <300pg/mL indicates                             ADHF is not likely  Performed at [facility], [address] [place], [place] [address]   Troponin T, High Sensitivity     Status: Abnormal   Collection Time: 10/08/24 11:55 AM  Result Value Ref Range   Troponin T High Sensitivity 27 (H) 0 - 19 ng/L    Comment: (NOTE) Biotin concentrations > 1000 ng/mL falsely decrease TnT results.  Serial cardiac troponin measurements are suggested.  Refer to the Links section for chest pain algorithms and additional  guidance. Performed at [facility], [address], [place], [place] [address]   Resp panel by RT-PCR (RSV, Flu A&B, Covid) Urine, Clean Catch     Status: None   Collection Time: 10/08/24 11:55 AM   Specimen: Urine, Clean Catch; Nasal Swab  Result Value Ref Range   SARS Coronavirus 2 by RT PCR NEGATIVE NEGATIVE   Influenza A by PCR NEGATIVE  NEGATIVE  Influenza B by PCR NEGATIVE NEGATIVE    Comment: (NOTE) The Xpert Xpress SARS-CoV-2/FLU/RSV plus assay is intended as an aid in the diagnosis of influenza from Nasopharyngeal swab specimens and should not be used as a sole basis for treatment. Nasal washings and aspirates are unacceptable for Xpert Xpress SARS-CoV-2/FLU/RSV testing.  Fact Sheet for Patients: bloggercourse.com  Fact Sheet for Healthcare Providers: seriousbroker.it  This test is not yet approved or cleared by the [place]  FDA and has been authorized for detection and/or diagnosis of SARS-CoV-2 by FDA under an Emergency Use Authorization (EUA). This EUA will remain in effect (meaning this test can be used) for the duration of the COVID-19 declaration under Section 564(b)(1) of the Act, 21 U.S.C. section 360bbb-3(b)(1), unless the authorization is terminated or revoked.     Resp Syncytial Virus by PCR NEGATIVE NEGATIVE    Comment: (NOTE) Fact Sheet for Patients: bloggercourse.com  Fact Sheet for Healthcare Providers: seriousbroker.it  This test is not yet approved or cleared by the [place]  FDA and has been authorized for detection and/or diagnosis of SARS-CoV-2 by FDA under an Emergency Use Authorization (EUA). This EUA will remain in effect (meaning this test can be used) for the duration of the COVID-19 declaration under Section 564(b)(1) of the Act, 21 U.S.C. section 360bbb-3(b)(1), unless the authorization is terminated or revoked.  Performed at [facility], [address], [place], [place] [address]   Group A Strep by PCR     Status: None   Collection Time: 10/08/24 11:55 AM   Specimen: Urine, Clean Catch; Sterile Swab  Result Value Ref Range   Group A Strep by PCR NOT DETECTED NOT DETECTED    Comment: Performed at [facility], [address] [place], [place] [address]   Lipase, blood     Status: Abnormal   Collection Time: 10/08/24 11:55 AM  Result Value Ref Range   Lipase 60 (H) 11 - 51 U/L    Comment: Performed at [facility], [address], [place], [place] [address]  hCG, serum, qualitative     Status: None   Collection Time: 10/08/24 11:55 AM  Result Value Ref Range   Preg, Serum NEGATIVE NEGATIVE    Comment:        THE SENSITIVITY OF THIS METHODOLOGY IS >10 mIU/mL. Performed at [facility], [address] [place], [place] [address]   Troponin T, High Sensitivity     Status: Abnormal   Collection Time: 10/08/24 12:45 PM  Result Value Ref Range   Troponin T High Sensitivity 26 (H) 0 - 19 ng/L    Comment: (NOTE) Biotin concentrations > 1000 ng/mL falsely decrease TnT results.  Serial cardiac troponin measurements are suggested.  Refer to the Links section for chest pain algorithms and additional  guidance. Performed at [facility], [address], [place], [place] [address]   I-stat chem 8, ED (not at [facility], [facility] or [facility])     Status: Abnormal   Collection Time: 10/08/24 12:58 PM  Result Value Ref Range   Sodium 133 (L) 135 - 145 mmol/L   Potassium 3.4 (L) 3.5 - 5.1 mmol/L   Chloride 97 (L) 98 - 111 mmol/L   BUN 14 6 - 20 mg/dL   Creatinine, Ser 9.19 0.44 - 1.00 mg/dL   Glucose, Bld 526 (H) 70 - 99 mg/dL    Comment: Glucose reference range applies only to samples taken after fasting for at least 8 hours.  Calcium , Ion 1.06 (L) 1.15 - 1.40 mmol/L   TCO2 24 22 - 32 mmol/L   Hemoglobin 12.6 12.0 - 15.0 g/dL   HCT 62.9 63.9 - 53.9 %  I-Stat venous blood gas, ([facility] ED, [facility], [facility])     Status: Abnormal   Collection Time: 10/08/24 12:59 PM  Result Value Ref Range   pH, Ven 7.406 7.25 - 7.43   pCO2, Ven 42.2 (L) 44 - 60 mmHg   pO2, Ven 42 32 - 45 mmHg   Bicarbonate 26.5 20.0 - 28.0 mmol/L   TCO2 28 22 - 32 mmol/L   O2 Saturation 78 %   Acid-Base Excess 2.0 0.0 - 2.0 mmol/L   Sodium 133 (L) 135 - 145 mmol/L   Potassium  3.4 (L) 3.5 - 5.1 mmol/L   Calcium , Ion 1.03 (L) 1.15 - 1.40 mmol/L   HCT 36.0 36.0 - 46.0 %   Hemoglobin 12.2 12.0 - 15.0 g/dL   Sample type VENOUS   POC CBG, ED     Status: Abnormal   Collection Time: 10/08/24  2:46 PM  Result Value Ref Range   Glucose-Capillary 374 (H) 70 - 99 mg/dL    Comment: Glucose reference range applies only to samples taken after fasting for at least 8 hours.  Type and screen     Status: None   Collection Time: 10/08/24  9:28 PM  Result Value Ref Range   ABO/RH(D) B POS    Antibody Screen NEG    Sample Expiration      10/11/2024,2359 Performed at [facility], [address] [place], [place] [address]   Magnesium      Status: None   Collection Time: 10/08/24  9:29 PM  Result Value Ref Range   Magnesium  1.8 1.7 - 2.4 mg/dL    Comment: Performed at [facility], [address], [place], [place] [address]  D-dimer, quantitative     Status: Abnormal   Collection Time: 10/08/24  9:29 PM  Result Value Ref Range   D-Dimer, Quant 1.24 (H) 0.00 - 0.50 ug/mL-FEU    Comment: (NOTE) At the manufacturer cut-off value of 0.5 g/mL FEU, this assay has a negative predictive value of 95-100%.This assay is intended for use in conjunction with a clinical pretest probability (PTP) assessment model to exclude pulmonary embolism (PE) and deep venous thrombosis (DVT) in outpatients suspected of PE or DVT. Results should be correlated with clinical presentation. Performed at [facility], [address], [place], [place] [address]   CBC     Status: Abnormal   Collection Time: 10/08/24  9:29 PM  Result Value Ref Range   WBC 7.0 4.0 - 10.5 K/uL   RBC 4.34 3.87 - 5.11 MIL/uL   Hemoglobin 11.8 (L) 12.0 - 15.0 g/dL   HCT 63.8 63.9 - 53.9 %   MCV 83.2 80.0 - 100.0 fL   MCH 27.2 26.0 - 34.0 pg   MCHC 32.7 30.0 - 36.0 g/dL   RDW 86.9 88.4 - 84.4 %   Platelets 337 150 - 400 K/uL   nRBC 0.0 0.0 - 0.2 %    Comment: Performed at [facility],  [address], [place], [place] [address]  T4, free     Status: None   Collection Time: 10/08/24  9:29 PM  Result Value Ref Range   Free T4 1.25 0.80 - 2.00 ng/dL    Comment: Performed at [facility], [address], [place], [place] [address]  TSH     Status: None   Collection Time: 10/08/24  9:29 PM  Result Value Ref Range   TSH 0.372 0.350 - 4.500 uIU/mL    Comment: Performed at [facility], [address] [place], [place] [address]  CBG monitoring, ED     Status: Abnormal   Collection Time: 10/09/24 12:02 AM  Result Value Ref Range   Glucose-Capillary 278 (H) 70 - 99 mg/dL    Comment: Glucose reference range applies only to samples taken after fasting for at least 8 hours.  CBC     Status: None   Collection Time: 10/09/24  3:34 AM  Result Value Ref Range   WBC 7.4 4.0 - 10.5 K/uL   RBC 4.48 3.87 - 5.11 MIL/uL   Hemoglobin 12.1 12.0 - 15.0 g/dL   HCT 62.9 63.9 - 53.9 %   MCV 82.6 80.0 - 100.0 fL   MCH 27.0 26.0 - 34.0 pg   MCHC 32.7 30.0 - 36.0 g/dL   RDW 86.8 88.4 - 84.4 %   Platelets 337 150 - 400 K/uL   nRBC 0.0 0.0 - 0.2 %    Comment: Performed at [facility], [address] [place], [place] [address]  Comprehensive metabolic panel     Status: Abnormal   Collection Time: 10/09/24  3:34 AM  Result Value Ref Range   Sodium 136 135 - 145 mmol/L   Potassium 3.6 3.5 - 5.1 mmol/L   Chloride 100 98 - 111 mmol/L   CO2 24 22 - 32 mmol/L   Glucose, Bld 292 (H) 70 - 99 mg/dL    Comment: Glucose reference range applies only to samples taken after fasting for at least 8 hours.   BUN 13 6 - 20 mg/dL   Creatinine, Ser 9.15 0.44 - 1.00 mg/dL   Calcium  8.4 (L) 8.9 - 10.3 mg/dL   Total Protein 6.8 6.5 - 8.1 g/dL   Albumin 2.9 (L) 3.5 - 5.0 g/dL   AST 20 15 - 41 U/L   ALT 18 0 - 44 U/L   Alkaline Phosphatase 97 38 - 126 U/L   Total Bilirubin 0.2 0.0 - 1.2 mg/dL   GFR, Estimated >39 >39 mL/min    Comment: (NOTE) Calculated using the CKD-EPI Creatinine Equation  (2021)    Anion gap 12 5 - 15    Comment: Performed at [facility], [address], [place], [place] [address]  Magnesium      Status: None   Collection Time: 10/09/24  3:34 AM  Result Value Ref Range   Magnesium  1.8 1.7 - 2.4 mg/dL    Comment: Performed at [facility], [address] [place], [place] [address]  Glucose, capillary     Status: Abnormal   Collection Time: 10/09/24  7:42 AM  Result Value Ref Range   Glucose-Capillary 326 (H) 70 - 99 mg/dL    Comment: Glucose reference range applies only to samples taken after fasting for at least 8 hours.  Glucose, capillary     Status: Abnormal   Collection Time: 10/09/24 11:43 AM  Result Value Ref Range   Glucose-Capillary 309 (H) 70 - 99 mg/dL    Comment: Glucose reference range applies only to samples taken after fasting for at least 8 hours.  Glucose, capillary     Status: Abnormal   Collection Time: 10/09/24  4:55 PM  Result Value Ref Range   Glucose-Capillary 182 (H) 70 - 99 mg/dL  Comment: Glucose reference range applies only to samples taken after fasting for at least 8 hours.  Glucose, capillary     Status: Abnormal   Collection Time: 10/09/24 10:04 PM  Result Value Ref Range   Glucose-Capillary 210 (H) 70 - 99 mg/dL    Comment: Glucose reference range applies only to samples taken after fasting for at least 8 hours.  CBC with Differential/Platelet     Status: Abnormal   Collection Time: 10/10/24  6:12 AM  Result Value Ref Range   WBC 7.7 4.0 - 10.5 K/uL   RBC 4.19 3.87 - 5.11 MIL/uL   Hemoglobin 11.5 (L) 12.0 - 15.0 g/dL   HCT 65.1 (L) 63.9 - 53.9 %   MCV 83.1 80.0 - 100.0 fL   MCH 27.4 26.0 - 34.0 pg   MCHC 33.0 30.0 - 36.0 g/dL   RDW 86.7 88.4 - 84.4 %   Platelets 317 150 - 400 K/uL   nRBC 0.0 0.0 - 0.2 %   Neutrophils Relative % 41 %   Neutro Abs 3.2 1.7 - 7.7 K/uL   Lymphocytes Relative 48 %   Lymphs Abs 3.7 0.7 - 4.0 K/uL   Monocytes Relative 8 %   Monocytes Absolute 0.6 0.1 - 1.0 K/uL    Eosinophils Relative 2 %   Eosinophils Absolute 0.1 0.0 - 0.5 K/uL   Basophils Relative 0 %   Basophils Absolute 0.0 0.0 - 0.1 K/uL   Immature Granulocytes 1 %   Abs Immature Granulocytes 0.04 0.00 - 0.07 K/uL    Comment: Performed at [facility], [address], [place], [place] [address]    ECG   N/A  Telemetry   Sinus rhythm - Personally Reviewed  Radiology    ECHOCARDIOGRAM COMPLETE Result Date: 10/09/2024    ECHOCARDIOGRAM REPORT   Patient Name:   LILYANNE MCQUOWN Date of Exam: 10/09/2024 Medical Rec #:  [id number]    Height:       67.0 in Accession #:    [id number]   Weight:       277.5 lb Date of Birth:  Jan 18, 1986    BSA:          2.324 m Patient Age:    38 years     BP:           158/86 mmHg Patient Gender: F            HR:           80 bpm. Exam Location:  Inpatient Procedure: 2D Echo, Cardiac Doppler and Color Doppler (Both Spectral and Color            Flow Doppler were utilized during procedure). Indications:    Congestive Heart Failure  History:        Patient has prior history of Echocardiogram examinations, most                 recent 07/17/2024. CHF; Risk Factors:Hypertension and Diabetes.  Sonographer:    Sherlean Dubin Referring Phys: [id number] [facility] K LE  Sonographer Comments: Image acquisition challenging due to respiratory motion and Image acquisition challenging due to patient body habitus. IMPRESSIONS  1. Left ventricular ejection fraction, by estimation, is 55 to 60%. The left ventricle has normal function. The left ventricle has no regional wall motion abnormalities. There is mild left ventricular hypertrophy. Left ventricular diastolic parameters are consistent with Grade I diastolic dysfunction (impaired relaxation).  2. Right ventricular systolic function is normal. The right ventricular size is  normal. There is normal pulmonary artery systolic pressure. The estimated right ventricular systolic pressure is 9.2 mmHg.  3. A small pericardial effusion is present. The  pericardial effusion is circumferential. There is no evidence of cardiac tamponade.  4. The mitral valve is normal in structure. No evidence of mitral valve regurgitation. No evidence of mitral stenosis.  5. The aortic valve is tricuspid. Aortic valve regurgitation is not visualized. No aortic stenosis is present.  6. The inferior vena cava is normal in size with greater than 50% respiratory variability, suggesting right atrial pressure of 3 mmHg. Comparison(s): A prior study was performed on 07/17/2024. LVEF remains preserved, LV cavity no longer dilated, no change in size of pericardial effusion. FINDINGS  Left Ventricle: Left ventricular ejection fraction, by estimation, is 55 to 60%. The left ventricle has normal function. The left ventricle has no regional wall motion abnormalities. The left ventricular internal cavity size was normal in size. There is  mild left ventricular hypertrophy. Left ventricular diastolic parameters are consistent with Grade I diastolic dysfunction (impaired relaxation). Right Ventricle: The right ventricular size is normal. No increase in right ventricular wall thickness. Right ventricular systolic function is normal. There is normal pulmonary artery systolic pressure. The tricuspid regurgitant velocity is 1.25 m/s, and  with an assumed right atrial pressure of 3 mmHg, the estimated right ventricular systolic pressure is 9.2 mmHg. Left Atrium: Left atrial size was normal in size. Right Atrium: Right atrial size was normal in size. Pericardium: A small pericardial effusion is present. The pericardial effusion is circumferential. There is no evidence of cardiac tamponade. Mitral Valve: The mitral valve is normal in structure. No evidence of mitral valve regurgitation. No evidence of mitral valve stenosis. Tricuspid Valve: The tricuspid valve is normal in structure. Tricuspid valve regurgitation is trivial. No evidence of tricuspid stenosis. Aortic Valve: The aortic valve is tricuspid.  Aortic valve regurgitation is not visualized. No aortic stenosis is present. Aortic valve mean gradient measures 5.5 mmHg. Aortic valve peak gradient measures 10.7 mmHg. Aortic valve area, by VTI measures 1.93  cm. Pulmonic Valve: The pulmonic valve was normal in structure. Pulmonic valve regurgitation is not visualized. No evidence of pulmonic stenosis. Aorta: The aortic root and ascending aorta are structurally normal, with no evidence of dilitation. Venous: The inferior vena cava is normal in size with greater than 50% respiratory variability, suggesting right atrial pressure of 3 mmHg. IAS/Shunts: No atrial level shunt detected by color flow Doppler.  LEFT VENTRICLE PLAX 2D LVIDd:         5.00 cm   Diastology LVIDs:         3.40 cm   LV e' medial:    3.50 cm/s LV PW:         1.40 cm   LV E/e' medial:  15.6 LV IVS:        1.30 cm   LV e' lateral:   5.00 cm/s LVOT diam:     2.00 cm   LV E/e' lateral: 10.9 LV SV:         57 LV SV Index:   25 LVOT Area:     3.14 cm  RIGHT VENTRICLE             IVC RV Basal diam:  3.20 cm     IVC diam: 1.60 cm RV Mid diam:    2.60 cm RV S prime:     11.70 cm/s TAPSE (M-mode): 1.8 cm LEFT ATRIUM  Index        RIGHT ATRIUM           Index LA diam:        5.00 cm 2.15 cm/m   RA Area:     17.70 cm LA Vol (A2C):   66.4 ml 28.57 ml/m  RA Volume:   48.50 ml  20.87 ml/m LA Vol (A4C):   67.4 ml 29.00 ml/m LA Biplane Vol: 68.4 ml 29.43 ml/m  AORTIC VALVE AV Area (Vmax):    1.94 cm AV Area (Vmean):   1.85 cm AV Area (VTI):     1.93 cm AV Vmax:           163.50 cm/s AV Vmean:          106.000 cm/s AV VTI:            0.295 m AV Peak Grad:      10.7 mmHg AV Mean Grad:      5.5 mmHg LVOT Vmax:         100.85 cm/s LVOT Vmean:        62.550 cm/s LVOT VTI:          0.182 m LVOT/AV VTI ratio: 0.62  AORTA Ao Root diam: 2.70 cm Ao Asc diam:  3.50 cm MITRAL VALVE               TRICUSPID VALVE MV Area (PHT): 3.31 cm    TR Peak grad:   6.2 mmHg MV Decel Time: 229 msec    TR Vmax:         125.00 cm/s MV E velocity: 54.50 cm/s MV A velocity: 64.40 cm/s  SHUNTS MV E/A ratio:  0.85        Systemic VTI:  0.18 m                            Systemic Diam: 2.00 cm Sunit Tolia Electronically signed by Madonna Large Signature Date/Time: 10/09/2024/1:38:56 PM    Final    MR BRAIN WO CONTRAST Result Date: 10/08/2024 EXAM: MRI Brain Without Contrast 10/08/2024 08:46:02 PM TECHNIQUE: Multiplanar multisequence MRI of the head/brain was performed without the administration of intravenous contrast. COMPARISON: None available. CLINICAL HISTORY: Syncope/presyncope, cerebrovascular cause suspected FINDINGS: BRAIN AND VENTRICLES: No acute infarct. Remote left parietal cortical infarct. No intracranial hemorrhage. No mass. No midline shift. No hydrocephalus. The sella is unremarkable. Normal flow voids. ORBITS: No significant abnormality. SINUSES AND MASTOIDS: No significant abnormality. BONES AND SOFT TISSUES: Normal marrow signal. No soft tissue abnormality. IMPRESSION: 1. No acute abnormality. 2. Remote left parietal cortical infarct. Electronically signed by: Glendia Molt MD 10/08/2024 08:55 PM EST RP Workstation: [id number]   CT Head Wo Contrast Result Date: 10/08/2024 CLINICAL DATA:  Near syncopal episode. EXAM: CT HEAD WITHOUT CONTRAST TECHNIQUE: Contiguous axial images were obtained from the base of the skull through the vertex without intravenous contrast. RADIATION DOSE REDUCTION: This exam was performed according to the departmental dose-optimization program which includes automated exposure control, adjustment of the mA and/or kV according to patient size and/or use of iterative reconstruction technique. COMPARISON:  01/30/2023 FINDINGS: Brain: No evidence of acute infarction, hemorrhage, hydrocephalus, extra-axial collection or mass lesion/mass effect. Vascular: No hyperdense vessel or unexpected calcification. Skull: Normal. Negative for fracture or focal lesion. Sinuses/Orbits: No acute finding. Other:  None. IMPRESSION: No acute intracranial pathology. Electronically Signed   By: Toribio Agreste M.D.   On: 10/08/2024 15:39   DG Chest  Portable 1 View Result Date: 10/08/2024 CLINICAL DATA:  Hyperglycemia with possible CHF exacerbation. EXAM: PORTABLE CHEST 1 VIEW COMPARISON:  July 15, 2024 FINDINGS: The cardiac silhouette is mildly enlarged and unchanged in size. Low lung volumes are noted. No acute infiltrate, pleural effusion or pneumothorax is identified. The visualized skeletal structures are unremarkable. IMPRESSION: Low lung volumes without acute or active cardiopulmonary disease. Electronically Signed   By: Suzen Dials M.D.   On: 10/08/2024 13:33    Cardiac Studies   Echo pending  Assessment   Principal Problem:   Acute diastolic (congestive) heart failure (HCC) Active Problems:   Dizziness   Plan   Not much recorded output overnight. Says she has flushed urine when she has a BM - both yesterday and today. Denies any diarrhea. Echo shows normal LVEF, small pericardial effusion. RA pressure low. May be able to stop diuretics today, especially if creatinine elevated based on RHC findings. Plan for [facility] today.  Informed Consent   Shared Decision Making/Informed Consent The risks [stroke (1 in 1000), death (1 in 1000), kidney failure [usually temporary] (1 in 500), bleeding (1 in 200), allergic reaction [possibly serious] (1 in 200)], benefits (diagnostic support and management of coronary artery disease) and alternatives of a cardiac catheterization were discussed in detail with Ms. Solanki and she is willing to proceed.     Time Spent Directly with Patient:  I have spent a total of 25 minutes with the patient reviewing hospital notes, telemetry, EKGs, labs and examining the patient as well as establishing an assessment and plan that was discussed personally with the patient.  > 50% of time was spent in direct patient care.  Length of Stay:  LOS: 2 days   Vinie KYM Maxcy,  MD, [facility], FNLA, FACP  [place]  [facility]  Medical Director of the [facility]  [facility] Reduction [facility] Diplomate of the American Board of Clinical Lipidology Attending Cardiologist  Direct Dial : [phone]  Fax: [phone]  Website:  [email or web address] Vinie BROCKS Kasten Leveque 10/10/2024, 7:00 AM   "

## 2024-10-10 NOTE — Plan of Care (Signed)
  Problem: Education: Goal: Ability to describe self-care measures that may prevent or decrease complications (Diabetes Survival Skills Education) will improve Outcome: Progressing Goal: Individualized Educational Video(s) Outcome: Progressing   Problem: Coping: Goal: Ability to adjust to condition or change in health will improve Outcome: Progressing   Problem: Fluid Volume: Goal: Ability to maintain a balanced intake and output will improve Outcome: Progressing   Problem: Health Behavior/Discharge Planning: Goal: Ability to identify and utilize available resources and services will improve Outcome: Progressing Goal: Ability to manage health-related needs will improve Outcome: Progressing   Problem: Metabolic: Goal: Ability to maintain appropriate glucose levels will improve Outcome: Progressing   Problem: Nutritional: Goal: Maintenance of adequate nutrition will improve Outcome: Progressing Goal: Progress toward achieving an optimal weight will improve Outcome: Progressing   Problem: Skin Integrity: Goal: Risk for impaired skin integrity will decrease Outcome: Progressing   Problem: Tissue Perfusion: Goal: Adequacy of tissue perfusion will improve Outcome: Progressing   Problem: Education: Goal: Ability to demonstrate management of disease process will improve Outcome: Progressing Goal: Ability to verbalize understanding of medication therapies will improve Outcome: Progressing Goal: Individualized Educational Video(s) Outcome: Progressing   Problem: Activity: Goal: Capacity to carry out activities will improve Outcome: Progressing   Problem: Cardiac: Goal: Ability to achieve and maintain adequate cardiopulmonary perfusion will improve Outcome: Progressing   Problem: Education: Goal: Knowledge of General Education information will improve Description: Including pain rating scale, medication(s)/side effects and non-pharmacologic comfort measures Outcome:  Progressing   Problem: Health Behavior/Discharge Planning: Goal: Ability to manage health-related needs will improve Outcome: Progressing   Problem: Clinical Measurements: Goal: Ability to maintain clinical measurements within normal limits will improve Outcome: Progressing Goal: Will remain free from infection Outcome: Progressing Goal: Diagnostic test results will improve Outcome: Progressing Goal: Respiratory complications will improve Outcome: Progressing Goal: Cardiovascular complication will be avoided Outcome: Progressing   Problem: Activity: Goal: Risk for activity intolerance will decrease Outcome: Progressing   Problem: Nutrition: Goal: Adequate nutrition will be maintained Outcome: Progressing   Problem: Coping: Goal: Level of anxiety will decrease Outcome: Progressing   Problem: Elimination: Goal: Will not experience complications related to bowel motility Outcome: Progressing Goal: Will not experience complications related to urinary retention Outcome: Progressing   Problem: Pain Managment: Goal: General experience of comfort will improve and/or be controlled Outcome: Progressing   Problem: Safety: Goal: Ability to remain free from injury will improve Outcome: Progressing   Problem: Skin Integrity: Goal: Risk for impaired skin integrity will decrease Outcome: Progressing   Problem: Education: Goal: Understanding of CV disease, CV risk reduction, and recovery process will improve Outcome: Progressing Goal: Individualized Educational Video(s) Outcome: Progressing   Problem: Activity: Goal: Ability to return to baseline activity level will improve Outcome: Progressing   Problem: Cardiovascular: Goal: Ability to achieve and maintain adequate cardiovascular perfusion will improve Outcome: Progressing Goal: Vascular access site(s) Level 0-1 will be maintained Outcome: Progressing   Problem: Health Behavior/Discharge Planning: Goal: Ability to  safely manage health-related needs after discharge will improve Outcome: Progressing

## 2024-10-11 ENCOUNTER — Other Ambulatory Visit (HOSPITAL_COMMUNITY): Payer: Self-pay

## 2024-10-11 DIAGNOSIS — I5031 Acute diastolic (congestive) heart failure: Secondary | ICD-10-CM | POA: Diagnosis not present

## 2024-10-11 LAB — BASIC METABOLIC PANEL WITH GFR
Anion gap: 9 (ref 5–15)
BUN: 15 mg/dL (ref 6–20)
CO2: 25 mmol/L (ref 22–32)
Calcium: 9.1 mg/dL (ref 8.9–10.3)
Chloride: 103 mmol/L (ref 98–111)
Creatinine, Ser: 0.95 mg/dL (ref 0.44–1.00)
GFR, Estimated: >60 mL/min
Glucose, Bld: 379 mg/dL — ABNORMAL HIGH (ref 70–99)
Potassium: 3.8 mmol/L (ref 3.5–5.1)
Sodium: 137 mmol/L (ref 135–145)

## 2024-10-11 LAB — GLUCOSE, CAPILLARY
Glucose-Capillary: 368 mg/dL — ABNORMAL HIGH (ref 70–99)
Glucose-Capillary: 283 mg/dL — ABNORMAL HIGH (ref 70–99)

## 2024-10-11 LAB — CBC
HCT: 34.2 % — ABNORMAL LOW (ref 36.0–46.0)
Hemoglobin: 11.4 g/dL — ABNORMAL LOW (ref 12.0–15.0)
MCH: 27.4 pg (ref 26.0–34.0)
MCHC: 33.3 g/dL (ref 30.0–36.0)
MCV: 82.2 fL (ref 80.0–100.0)
Platelets: 374 10*3/uL (ref 150–400)
RBC: 4.16 MIL/uL (ref 3.87–5.11)
RDW: 13.3 % (ref 11.5–15.5)
WBC: 13 10*3/uL — ABNORMAL HIGH (ref 4.0–10.5)
nRBC: 0 % (ref 0.0–0.2)

## 2024-10-11 MED ORDER — ASPIRIN 81 MG PO CHEW
81.0000 mg | CHEWABLE_TABLET | Freq: Every day | ORAL | 0 refills | Status: AC
  Filled 2024-10-11: qty 90, 90d supply, fill #0

## 2024-10-11 MED ORDER — INSULIN PEN NEEDLE 32G X 4 MM MISC
1.0000 | 0 refills | Status: AC
  Filled 2024-10-11: qty 100, 25d supply, fill #0

## 2024-10-11 MED ORDER — FUROSEMIDE 40 MG PO TABS
40.0000 mg | ORAL_TABLET | Freq: Every day | ORAL | 0 refills | Status: DC
  Filled 2024-10-11: qty 90, 90d supply, fill #0

## 2024-10-11 MED ORDER — TICAGRELOR 90 MG PO TABS
90.0000 mg | ORAL_TABLET | Freq: Two times a day (BID) | ORAL | 0 refills | Status: AC
  Filled 2024-10-11: qty 180, 90d supply, fill #0

## 2024-10-11 MED ORDER — GABAPENTIN 300 MG PO CAPS
300.0000 mg | ORAL_CAPSULE | Freq: Two times a day (BID) | ORAL | 0 refills | Status: AC
  Filled 2024-10-11: qty 180, 90d supply, fill #0

## 2024-10-11 MED ORDER — SPIRONOLACTONE 50 MG PO TABS
50.0000 mg | ORAL_TABLET | Freq: Every day | ORAL | 0 refills | Status: AC
  Filled 2024-10-11: qty 90, 90d supply, fill #0

## 2024-10-11 MED ORDER — ROSUVASTATIN CALCIUM 20 MG PO TABS
20.0000 mg | ORAL_TABLET | Freq: Every day | ORAL | 0 refills | Status: AC
  Filled 2024-10-11: qty 90, 90d supply, fill #0

## 2024-10-11 MED ORDER — INSULIN GLARGINE 100 UNIT/ML SOLOSTAR PEN
50.0000 [IU] | PEN_INJECTOR | Freq: Two times a day (BID) | SUBCUTANEOUS | 0 refills | Status: AC
  Filled 2024-10-11: qty 30, 30d supply, fill #0

## 2024-10-11 MED ORDER — INSULIN ASPART 100 UNIT/ML FLEXPEN
5.0000 [IU] | PEN_INJECTOR | Freq: Three times a day (TID) | SUBCUTANEOUS | 0 refills | Status: AC
  Filled 2024-10-11: qty 15, 45d supply, fill #0

## 2024-10-11 MED ORDER — IRBESARTAN 300 MG PO TABS
300.0000 mg | ORAL_TABLET | Freq: Every day | ORAL | 0 refills | Status: AC
  Filled 2024-10-11: qty 90, 90d supply, fill #0

## 2024-10-11 MED ORDER — BLOOD GLUCOSE TEST VI STRP
1.0000 | ORAL_STRIP | 0 refills | Status: AC
  Filled 2024-10-11: qty 100, 25d supply, fill #0

## 2024-10-11 NOTE — Progress Notes (Signed)
 CARDIAC REHAB PHASE I   PRE:  Rate/Rhythm: 81 maybe BBB, could be lead placement  BP:  Supine: 164/85     SaO2: 99%RA  MODE:  Ambulation: 470 ft   POST:  Rate/Rhythm: 103  BP:  Supine: 180/94      SaO2: 96-99 RA on walk  Pt tolerated exercise well with elevated BP. AMB 470 ft with no assistive device, and standby assist. Pt had 2 standing rest breaks, no chest pain, mild SOB and no pain. Education given to pt on heart healthy diet, radial/femoral weight restrictions, adherence to NTG, Brilinta  and ASA.  Home exercise guidelines given and will refer to cardiac rehab phase 2 in [place]. She does sound interested in CR and talked about the nutrition component to help with DM management. Pt left in the bed with call bell in reach. All questions were answered and pt verbalized understanding.  Alec GORMAN Finder ACSM-CEP 10/11/2024 10:35 AM

## 2024-10-11 NOTE — Progress Notes (Signed)
 Discharge Nurse Summary: DC order noted per MD. DC RN at bedside with patient. Patient agreeable with discharge plan, states family will arrive soon for pickup. AVS printed/reviewed. PIV removed, skin intact. No DME needs. No home meds. TOC meds delivered to the patient. CP/Edu resolved. Telemonitor returned to charging station. All belongings accounted for. NT wheeled patient downstairs for discharge by private auto.   Rosario EMERSON Lund, RN

## 2024-10-11 NOTE — Discharge Summary (Signed)
 "  Physician Discharge Summary  Carolyn Zimmerman FMW:[id number] DOB: 1986/08/18 DOA: 10/08/2024  PCP: Leonarda Roxan BROCKS, NP  Admit date: 10/08/2024 Discharge date: 10/11/2024  Admitted from: Home Discharge disposition: Home  Recommendations at discharge:  Encourage compliance to her medicines including insulin  and heart meds Follow-up with cardiology as an outpatient Dietary discipline to be followed.   Subjective: Patient was seen and examined this morning. Young morbidly obese African-American female Lying on bed.  Not in distress.  Feels better.  Feels ready to go home.  Afebrile, heart rate in 80s and 90s, blood pressure in 140s to 160s, Breathing on room air Labs from this morning with renal function stable. Blood sugar level remains elevated over 300.  Brief narrative: Carolyn Zimmerman is a 39 y.o. female with PMH significant for morbid obesity, DM2, resistant hypertension, HLD, CVA, CHF, h/o smoking.  1/21-patient was at cardiology office where she had a near syncopal episode and brought to the ED by EMS.   Previously seen at cardiology office on 1/14 as a routine visit.  Was noted to have lower extremity edema despite being on torsemide  and Aldactone .  3 days of oral Lasix  was added.  She returned to the office on 1/21 for a follow-up visit.  Reported lightheadedness, dizziness and almost passed out after standing up to leave the office.  EMS noted blood sugar level elevated to 532.  Reports she has not been taking her insulin  for the last week.  Not feeling well.  In the ED, patient was afebrile, blood pressure 150/87, heart rate 81, breathing room air. Labs with proBNP 783, Hs-troponins 27 >> 26.  CMP with sodium 131, renal function normal, CBC unremarkable EKG showed sinus rhythm, rate 82 bpm.  RVP unremarkable Chest x-ray unremarkable CT head unremarkable  Patient was given IV Lasix  Admitted to [facility] Cardiology was consulted  Hospital course: Acute exacerbation of chronic  CHF  Uncontrolled hypertension Has history of uncontrolled hypertension despite being on multiple different medicines as above.  Also complicated by recent noncompliance due to cost. Presented with persistent lower extremity edema for the 3 weeks and lightheadedness, dizziness after taking Lasix  for 3 days On exam, noted to have 2+ pedal edema bilaterally Chest x-ray without pulm edema but proBNP elevated to 783 Most recent echo from 06/2024 showed EF of 60 to 65%, mild LVH, normal LV systolic function PTA meds- carvedilol  25 mg twice daily, valsartan  320 mg daily, torsemide  20 mg daily, Aldactone  50 mg daily, hydralazine  50 mg 3 times daily.  But apparently patient had not been taking her medications for the past few weeks because of cost. Cardiology was consulted.  Diuresed with IV Lasix .  Intake and output documentation unclear. 1/22, echo showed EF 55 to 60%, no WMA, mild LVH, G1 DD, normal RV size, function and pulmonary artery pressure. 1/23, underwent [facility] LHC.  Found to have OM1 CTO and LVEDP 15 mmHg 1/24, cardiology follow-up appreciated.  Recommended discharge on Lasix  40 mg daily, amlodipine  10 mg daily, irbesartan  300 mg daily and Aldactone  50 mg daily. Noted that patient was seen by cardiology Dr. Darron on 1/20 and planned for renal denervation for resistant hypertension Continue to follow-up with cardiology as an outpatient  Elevated troponin  Chest pain  Complains of intermittent chest pain with dyspnea for the last few months  Troponin mildly elevated without ischemic changes and EKG Cardiac CTA on 05/2024 inconclusive 1/23, LHC showed OM1 CTO. Currently on aspirin , Brilinta , statin.  Type 2 diabetes mellitus uncontrolled Peripheral  neuropathy A1c 13.9 on 10/10/2024 PTA meds-Lantus  50 units twice daily, Humalog  Premeal 20 units 3 times daily. Reportedly not been taking her medications lately because she states she is not able to afford them. Apparently all of her meds are on  $4 list. Diabetes care coordinator consult appreciated. Currently on Lantus  35 units twice daily, NovoLog  5 units 3 times daily and SSI/Accu-Cheks.   Blood sugar level still running elevated over 300 this morning.  I will discharge her on Lantus  50 units twice daily, NovoLog  5 units 3 times daily with Accu-Cheks. Continue gabapentin  300 mg twice daily for neuropathy. Recent Labs  Lab 10/09/24 2204 10/10/24 1229 10/10/24 2104 10/11/24 0919 10/11/24 1140  GLUCAP 210* 409* 398* 368* 283*   H/o CVA  HLD Continue aspirin , Crestor .  Hypokalemia While on diuretics.  Potassium replacement given Continue to monitor  Sore throat Evaluation thus far is negative with a negative group A strep PCR, respiratory panel is negative for flu RSV and COVID, differentials could also be reflux related.  started on PPI empirically Improved with Chloraseptic spray.  Nutrition Status: Nutrition Problem: Altered nutrition lab value Etiology: chronic illness Signs/Symptoms:  (A1C of 13.5 and CBGs of 182-578 mg/dL in the last 24 hours) Interventions: Education   Goals of care   Code Status: Full Code   Diet:  Diet Order             Diet general           Diet heart healthy/carb modified Fluid consistency: Thin  Diet effective now                   Nutritional status:  Body mass index is 43.43 kg/m.  Nutrition Problem: Altered nutrition lab value Etiology: chronic illness Signs/Symptoms:  (A1C of 13.5 and CBGs of 182-578 mg/dL in the last 24 hours)  Wounds:  -    Discharge Medications:   Allergies as of 10/11/2024       Reactions   Prochlorperazine  Anxiety   Panic attacks   Contrast Media [iodinated Contrast Media] Hives, Swelling        Medication List     STOP taking these medications    carvedilol  25 MG tablet Commonly known as: COREG    Furoscix  80 MG/10ML Ctkt Generic drug: Furosemide  Replaced by: furosemide  40 MG tablet   hydrALAZINE  50 MG tablet Commonly  known as: APRESOLINE    nystatin  cream Commonly known as: MYCOSTATIN    potassium chloride  SA 20 MEQ tablet Commonly known as: Klor-Con  M20   torsemide  20 MG tablet Commonly known as: DEMADEX    valsartan  320 MG tablet Commonly known as: DIOVAN  Replaced by: irbesartan  300 MG tablet   Vascepa 1 g capsule Generic drug: icosapent Ethyl       TAKE these medications    acetaminophen  500 MG tablet Commonly known as: TYLENOL  Take 1,000 mg by mouth every 6 (six) hours as needed for mild pain (pain score 1-3) or moderate pain (pain score 4-6).   aspirin  81 MG chewable tablet Chew 1 tablet (81 mg total) by mouth daily.   bismuth subsalicylate 262 MG/15ML suspension Commonly known as: PEPTO BISMOL Take 30 mLs by mouth every 6 (six) hours as needed for indigestion or diarrhea or loose stools.   BLOOD GLUCOSE TEST STRIPS Strp 1 each by Does not apply route as directed. Dispense based on patient and insurance preference. Use up to four times daily as directed. (FOR ICD-10 E10.9, E11.9).   CINNAMON PO Take 1 capsule by  mouth daily at 12 noon.   FreeStyle Libre 3 Plus Sensor Misc Replace every 15 days   Dexcom G7 Sensor Misc 1 Application by Does not apply route daily.   FreeStyle Libre 3 Reader [place] Use as directed for continuous glucose monitoring.   furosemide  40 MG tablet Commonly known as: Lasix  Take 1 tablet (40 mg total) by mouth daily. Replaces: Furoscix  80 MG/10ML Ctkt   gabapentin  300 MG capsule Commonly known as: NEURONTIN  Take 1 capsule (300 mg total) by mouth 2 (two) times daily.   hydrOXYzine  10 MG tablet Commonly known as: ATARAX  Take 1 tablet (10 mg total) by mouth as needed for anxiety.   insulin  aspart 100 UNIT/ML FlexPen Commonly known as: NOVOLOG  Inject 5 Units into the skin 3 (three) times daily with meals. If eating and Blood Glucose (BG) 80 or higher inject 5 units for meal coverage and add correction dose per scale. If not eating, correction dose  only. BG <150= 0 unit; BG 150-200= 1 unit; BG 201-250= 2 unit; BG 251-300= 3 unit; BG 301-350= 4 unit; BG 351-400= 5 unit; BG >400= 6 unit and Call Primary care.   insulin  glargine 100 UNIT/ML Solostar Pen Commonly known as: LANTUS  Inject 50 Units into the skin 2 (two) times daily. May substitute as needed per insurance.   irbesartan  300 MG tablet Commonly known as: AVAPRO  Take 1 tablet (300 mg total) by mouth daily. Start taking on: October 12, 2024 Replaces: valsartan  320 MG tablet   loperamide  2 MG capsule Commonly known as: IMODIUM  Take 1 capsule (2 mg total) by mouth 4 (four) times daily as needed for diarrhea or loose stools.   Pen Needles 31G X 5 MM Misc 1 each by Does not apply route as directed. Dispense based on patient and insurance preference. Use up to four times daily as directed. (FOR ICD-10 E10.9, E11.9).   rosuvastatin  20 MG tablet Commonly known as: CRESTOR  Take 1 tablet (20 mg total) by mouth daily.   senna-docusate 8.6-50 MG tablet Commonly known as: Senokot-S Take 1-2 tablets by mouth 2 (two) times daily between meals as needed for mild constipation or moderate constipation.   spironolactone  50 MG tablet Commonly known as: ALDACTONE  Take 1 tablet (50 mg total) by mouth daily. Start taking on: October 12, 2024   ticagrelor  90 MG Tabs tablet Commonly known as: BRILINTA  Take 1 tablet (90 mg total) by mouth 2 (two) times daily.   Ventolin  HFA 108 (90 Base) MCG/ACT inhaler Generic drug: albuterol  Inhale 2 puffs into the lungs every 4 (four) hours as needed for wheezing or shortness of breath.         Follow ups:    Follow-up Information     Ngetich, [name], NP Follow up.   Specialty: Family Medicine Contact information: [address] [place] [address] [phone]                 Discharge Instructions:   Discharge Instructions     Amb Referral to Cardiac Rehabilitation   Complete by: As directed    Diagnosis: Coronary Stents    After initial evaluation and assessments completed: Virtual Based Care may be provided alone or in conjunction with Phase 2 Cardiac Rehab based on patient barriers.: Yes   Intensive Cardiac Rehabilitation (ICR) [facility] location only OR Traditional Cardiac Rehabilitation (TCR) *If criteria for ICR are not met will enroll in TCR [facility] only): Yes   Call MD for:  difficulty breathing, headache or visual disturbances  Complete by: As directed    Call MD for:  extreme fatigue   Complete by: As directed    Call MD for:  hives   Complete by: As directed    Call MD for:  persistant dizziness or light-headedness   Complete by: As directed    Call MD for:  persistant nausea and vomiting   Complete by: As directed    Call MD for:  severe uncontrolled pain   Complete by: As directed    Call MD for:  temperature >100.4   Complete by: As directed    Diet general   Complete by: As directed    Discharge instructions   Complete by: As directed    Recommendations at discharge:   Encourage compliance to her medicines including insulin  and heart meds  Follow-up with cardiology as an outpatient  Dietary discipline to be followed.  Discharge instructions for diabetes mellitus: Check blood sugar 3 times a day and bedtime at home. If blood sugar running above 200 or less than 70 please call your MD to adjust insulin . If you notice signs and symptoms of hypoglycemia (low blood sugar) like jitteriness, confusion, thirst, tremor and sweating, please check blood sugar, drink sugary drink/biscuits/sweets to increase sugar level and call MD or return to ER.      General discharge instructions: Follow with Primary MD Ngetich, Roxan BROCKS, NP in 7 days  Please request your PCP  to go over your hospital tests, procedures, radiology results at the follow up. Please get your medicines reviewed and adjusted.  Your PCP may decide to repeat certain labs or tests as needed. Do not drive, operate heavy machinery, perform  activities at heights, swimming or participation in water  activities or provide baby sitting services if your were admitted for syncope or siezures until you have seen by Primary MD or a Neurologist and advised to do so again. [place]  Controlled Substance Reporting System database was reviewed. Do not drive, operate heavy machinery, perform activities at heights, swim, participate in water  activities or provide baby-sitting services while on medications for pain, sleep and mood until your outpatient physician has reevaluated you and advised to do so again.  You are strongly recommended to comply with the dose, frequency and duration of prescribed medications. Activity: As tolerated with Full fall precautions use walker/cane & assistance as needed Avoid using any recreational substances like cigarette, tobacco, alcohol, or non-prescribed drug. If you experience worsening of your admission symptoms, develop shortness of breath, life threatening emergency, suicidal or homicidal thoughts you must seek medical attention immediately by calling 911 or calling your MD immediately  if symptoms less severe. You must read complete instructions/literature along with all the possible adverse reactions/side effects for all the medicines you take and that have been prescribed to you. Take any new medicine only after you have completely understood and accepted all the possible adverse reactions/side effects.  Wear Seat belts while driving. You were cared for by a hospitalist during your hospital stay. If you have any questions about your discharge medications or the care you received while you were in the hospital after you are discharged, you can call the unit and ask to speak with the hospitalist or the covering physician. Once you are discharged, your primary care physician will handle any further medical issues. Please note that NO REFILLS for any discharge medications will be authorized once you are discharged,  as it is imperative that you return to your primary care physician (or establish a  relationship with a primary care physician if you do not have one).   Increase activity slowly   Complete by: As directed        Discharge Exam:   Vitals:   10/11/24 0024 10/11/24 0550 10/11/24 0719 10/11/24 1141  BP: (!) 148/81 (!) 169/83 (!) 141/81 138/72  Pulse:   (!) 110 76  Resp: 18 18 18 16   Temp: 98.9 F (37.2 C) 98.2 F (36.8 C) 98.2 F (36.8 C) 98.2 F (36.8 C)  TempSrc: Oral Oral Oral Oral  SpO2: 100%  100% 99%  Weight:  125.8 kg    Height:        Body mass index is 43.43 kg/m.   General exam: Pleasant, morbidly obese young African-American female Skin: No rashes, lesions or ulcers. HEENT: Atraumatic, normocephalic, no obvious bleeding Lungs: Clear to auscultation bilaterally,  CVS: S1, S2, no murmur,   GI/Abd: Soft, nontender, nondistended, bowel sound present,   CNS: Alert, awake, oriented x 3 Psychiatry: Mood appropriate Extremities: Improving pedal edema, no calf tenderness,   The results of significant diagnostics from this hospitalization (including imaging, microbiology, ancillary and laboratory) are listed below for reference.    Procedures and Diagnostic Studies:   ECHOCARDIOGRAM COMPLETE Result Date: 10/09/2024    ECHOCARDIOGRAM REPORT   Patient Name:   ZALIKA TIESZEN Date of Exam: 10/09/2024 Medical Rec #:  [id number]    Height:       67.0 in Accession #:    [id number]   Weight:       277.5 lb Date of Birth:  August 11, 1986    BSA:          2.324 m Patient Age:    38 years     BP:           158/86 mmHg Patient Gender: F            HR:           80 bpm. Exam Location:  Inpatient Procedure: 2D Echo, Cardiac Doppler and Color Doppler (Both Spectral and Color            Flow Doppler were utilized during procedure). Indications:    Congestive Heart Failure  History:        Patient has prior history of Echocardiogram examinations, most                 recent 07/17/2024. CHF; Risk  Factors:Hypertension and Diabetes.  Sonographer:    Sherlean Dubin Referring Phys: [id number] [facility] K LE  Sonographer Comments: Image acquisition challenging due to respiratory motion and Image acquisition challenging due to patient body habitus. IMPRESSIONS  1. Left ventricular ejection fraction, by estimation, is 55 to 60%. The left ventricle has normal function. The left ventricle has no regional wall motion abnormalities. There is mild left ventricular hypertrophy. Left ventricular diastolic parameters are consistent with Grade I diastolic dysfunction (impaired relaxation).  2. Right ventricular systolic function is normal. The right ventricular size is normal. There is normal pulmonary artery systolic pressure. The estimated right ventricular systolic pressure is 9.2 mmHg.  3. A small pericardial effusion is present. The pericardial effusion is circumferential. There is no evidence of cardiac tamponade.  4. The mitral valve is normal in structure. No evidence of mitral valve regurgitation. No evidence of mitral stenosis.  5. The aortic valve is tricuspid. Aortic valve regurgitation is not visualized. No aortic stenosis is present.  6. The inferior vena cava is normal in size with greater than 50% respiratory variability,  suggesting right atrial pressure of 3 mmHg. Comparison(s): A prior study was performed on 07/17/2024. LVEF remains preserved, LV cavity no longer dilated, no change in size of pericardial effusion. FINDINGS  Left Ventricle: Left ventricular ejection fraction, by estimation, is 55 to 60%. The left ventricle has normal function. The left ventricle has no regional wall motion abnormalities. The left ventricular internal cavity size was normal in size. There is  mild left ventricular hypertrophy. Left ventricular diastolic parameters are consistent with Grade I diastolic dysfunction (impaired relaxation). Right Ventricle: The right ventricular size is normal. No increase in right ventricular wall  thickness. Right ventricular systolic function is normal. There is normal pulmonary artery systolic pressure. The tricuspid regurgitant velocity is 1.25 m/s, and  with an assumed right atrial pressure of 3 mmHg, the estimated right ventricular systolic pressure is 9.2 mmHg. Left Atrium: Left atrial size was normal in size. Right Atrium: Right atrial size was normal in size. Pericardium: A small pericardial effusion is present. The pericardial effusion is circumferential. There is no evidence of cardiac tamponade. Mitral Valve: The mitral valve is normal in structure. No evidence of mitral valve regurgitation. No evidence of mitral valve stenosis. Tricuspid Valve: The tricuspid valve is normal in structure. Tricuspid valve regurgitation is trivial. No evidence of tricuspid stenosis. Aortic Valve: The aortic valve is tricuspid. Aortic valve regurgitation is not visualized. No aortic stenosis is present. Aortic valve mean gradient measures 5.5 mmHg. Aortic valve peak gradient measures 10.7 mmHg. Aortic valve area, by VTI measures 1.93  cm. Pulmonic Valve: The pulmonic valve was normal in structure. Pulmonic valve regurgitation is not visualized. No evidence of pulmonic stenosis. Aorta: The aortic root and ascending aorta are structurally normal, with no evidence of dilitation. Venous: The inferior vena cava is normal in size with greater than 50% respiratory variability, suggesting right atrial pressure of 3 mmHg. IAS/Shunts: No atrial level shunt detected by color flow Doppler.  LEFT VENTRICLE PLAX 2D LVIDd:         5.00 cm   Diastology LVIDs:         3.40 cm   LV e' medial:    3.50 cm/s LV PW:         1.40 cm   LV E/e' medial:  15.6 LV IVS:        1.30 cm   LV e' lateral:   5.00 cm/s LVOT diam:     2.00 cm   LV E/e' lateral: 10.9 LV SV:         57 LV SV Index:   25 LVOT Area:     3.14 cm  RIGHT VENTRICLE             IVC RV Basal diam:  3.20 cm     IVC diam: 1.60 cm RV Mid diam:    2.60 cm RV S prime:     11.70 cm/s  TAPSE (M-mode): 1.8 cm LEFT ATRIUM             Index        RIGHT ATRIUM           Index LA diam:        5.00 cm 2.15 cm/m   RA Area:     17.70 cm LA Vol (A2C):   66.4 ml 28.57 ml/m  RA Volume:   48.50 ml  20.87 ml/m LA Vol (A4C):   67.4 ml 29.00 ml/m LA Biplane Vol: 68.4 ml 29.43 ml/m  AORTIC VALVE AV Area (Vmax):  1.94 cm AV Area (Vmean):   1.85 cm AV Area (VTI):     1.93 cm AV Vmax:           163.50 cm/s AV Vmean:          106.000 cm/s AV VTI:            0.295 m AV Peak Grad:      10.7 mmHg AV Mean Grad:      5.5 mmHg LVOT Vmax:         100.85 cm/s LVOT Vmean:        62.550 cm/s LVOT VTI:          0.182 m LVOT/AV VTI ratio: 0.62  AORTA Ao Root diam: 2.70 cm Ao Asc diam:  3.50 cm MITRAL VALVE               TRICUSPID VALVE MV Area (PHT): 3.31 cm    TR Peak grad:   6.2 mmHg MV Decel Time: 229 msec    TR Vmax:        125.00 cm/s MV E velocity: 54.50 cm/s MV A velocity: 64.40 cm/s  SHUNTS MV E/A ratio:  0.85        Systemic VTI:  0.18 m                            Systemic Diam: 2.00 cm Sunit Tolia Electronically signed by Madonna Large Signature Date/Time: 10/09/2024/1:38:56 PM    Final    MR BRAIN WO CONTRAST Result Date: 10/08/2024 EXAM: MRI Brain Without Contrast 10/08/2024 08:46:02 PM TECHNIQUE: Multiplanar multisequence MRI of the head/brain was performed without the administration of intravenous contrast. COMPARISON: None available. CLINICAL HISTORY: Syncope/presyncope, cerebrovascular cause suspected FINDINGS: BRAIN AND VENTRICLES: No acute infarct. Remote left parietal cortical infarct. No intracranial hemorrhage. No mass. No midline shift. No hydrocephalus. The sella is unremarkable. Normal flow voids. ORBITS: No significant abnormality. SINUSES AND MASTOIDS: No significant abnormality. BONES AND SOFT TISSUES: Normal marrow signal. No soft tissue abnormality. IMPRESSION: 1. No acute abnormality. 2. Remote left parietal cortical infarct. Electronically signed by: Glendia Molt MD 10/08/2024 08:55 PM  EST RP Workstation: [id number]   CT Head Wo Contrast Result Date: 10/08/2024 CLINICAL DATA:  Near syncopal episode. EXAM: CT HEAD WITHOUT CONTRAST TECHNIQUE: Contiguous axial images were obtained from the base of the skull through the vertex without intravenous contrast. RADIATION DOSE REDUCTION: This exam was performed according to the departmental dose-optimization program which includes automated exposure control, adjustment of the mA and/or kV according to patient size and/or use of iterative reconstruction technique. COMPARISON:  01/30/2023 FINDINGS: Brain: No evidence of acute infarction, hemorrhage, hydrocephalus, extra-axial collection or mass lesion/mass effect. Vascular: No hyperdense vessel or unexpected calcification. Skull: Normal. Negative for fracture or focal lesion. Sinuses/Orbits: No acute finding. Other: None. IMPRESSION: No acute intracranial pathology. Electronically Signed   By: Toribio Agreste M.D.   On: 10/08/2024 15:39   DG Chest Portable 1 View Result Date: 10/08/2024 CLINICAL DATA:  Hyperglycemia with possible CHF exacerbation. EXAM: PORTABLE CHEST 1 VIEW COMPARISON:  July 15, 2024 FINDINGS: The cardiac silhouette is mildly enlarged and unchanged in size. Low lung volumes are noted. No acute infiltrate, pleural effusion or pneumothorax is identified. The visualized skeletal structures are unremarkable. IMPRESSION: Low lung volumes without acute or active cardiopulmonary disease. Electronically Signed   By: Suzen Dials M.D.   On: 10/08/2024 13:33     Labs:   Basic Metabolic Panel:  Recent Labs  Lab 10/08/24 1155 10/08/24 1258 10/08/24 1259 10/08/24 2129 10/09/24 0334 10/10/24 0612 10/10/24 1005 10/10/24 1014 10/10/24 1501 10/11/24 0506  NA 131* 133*   < >  --  136 136 137 138  137  --  137  K 3.7 3.4*   < >  --  3.6 3.4* 3.9 4.0  4.0  --  3.8  CL 95* 97*  --   --  100 102  --   --   --  103  CO2 25  --   --   --  24 26  --   --   --  25  GLUCOSE 474*  473*  --   --  292* 308*  --   --  458* 379*  BUN 13 14  --   --  13 12  --   --   --  15  CREATININE 0.78 0.80  --   --  0.84 0.76  --   --   --  0.95  CALCIUM  8.3*  --   --   --  8.4* 8.5*  --   --   --  9.1  MG  --   --   --  1.8 1.8  --   --   --   --   --    < > = values in this interval not displayed.   GFR Estimated Creatinine Clearance: 112.8 mL/min (by C-G formula based on SCr of 0.95 mg/dL). Liver Function Tests: Recent Labs  Lab 10/08/24 1155 10/09/24 0334  AST 27 20  ALT 19 18  ALKPHOS 135* 97  BILITOT 0.2 0.2  PROT 7.1 6.8  ALBUMIN 3.0* 2.9*   Recent Labs  Lab 10/08/24 1155  LIPASE 60*   No results for input(s): AMMONIA in the last 168 hours. Coagulation profile No results for input(s): INR, PROTIME in the last 168 hours.  CBC: Recent Labs  Lab 10/08/24 1155 10/08/24 1258 10/08/24 2129 10/09/24 0334 10/10/24 0612 10/10/24 1005 10/10/24 1014 10/11/24 0506  WBC 7.5  --  7.0 7.4 7.7  --   --  13.0*  NEUTROABS 4.2  --   --   --  3.2  --   --   --   HGB 11.9*   < > 11.8* 12.1 11.5* 12.2 12.2  12.2 11.4*  HCT 35.7*   < > 36.1 37.0 34.8* 36.0 36.0  36.0 34.2*  MCV 82.3  --  83.2 82.6 83.1  --   --  82.2  PLT 315  --  337 337 317  --   --  374   < > = values in this interval not displayed.   Cardiac Enzymes: No results for input(s): CKTOTAL, CKMB, CKMBINDEX, TROPONINI in the last 168 hours. BNP: Invalid input(s): POCBNP CBG: Recent Labs  Lab 10/09/24 2204 10/10/24 1229 10/10/24 2104 10/11/24 0919 10/11/24 1140  GLUCAP 210* 409* 398* 368* 283*   D-Dimer Recent Labs    10/08/24 2129  DDIMER 1.24*   Hgb A1c Recent Labs    10/10/24 0612  HGBA1C 13.9*   Lipid Profile No results for input(s): CHOL, HDL, LDLCALC, TRIG, CHOLHDL, LDLDIRECT in the last 72 hours. Thyroid  function studies Recent Labs    10/08/24 2129  TSH 0.372   Anemia work up No results for input(s): VITAMINB12, FOLATE, FERRITIN,  TIBC, IRON, RETICCTPCT in the last 72 hours. Microbiology Recent Results (from the past 240 hours)  Resp panel by RT-PCR (RSV, Flu A&B,  Covid) Urine, Clean Catch     Status: None   Collection Time: 10/08/24 11:55 AM   Specimen: Urine, Clean Catch; Nasal Swab  Result Value Ref Range Status   SARS Coronavirus 2 by RT PCR NEGATIVE NEGATIVE Final   Influenza A by PCR NEGATIVE NEGATIVE Final   Influenza B by PCR NEGATIVE NEGATIVE Final    Comment: (NOTE) The Xpert Xpress SARS-CoV-2/FLU/RSV plus assay is intended as an aid in the diagnosis of influenza from Nasopharyngeal swab specimens and should not be used as a sole basis for treatment. Nasal washings and aspirates are unacceptable for Xpert Xpress SARS-CoV-2/FLU/RSV testing.  Fact Sheet for Patients: bloggercourse.com  Fact Sheet for Healthcare Providers: seriousbroker.it  This test is not yet approved or cleared by the [place]  FDA and has been authorized for detection and/or diagnosis of SARS-CoV-2 by FDA under an Emergency Use Authorization (EUA). This EUA will remain in effect (meaning this test can be used) for the duration of the COVID-19 declaration under Section 564(b)(1) of the Act, 21 U.S.C. section 360bbb-3(b)(1), unless the authorization is terminated or revoked.     Resp Syncytial Virus by PCR NEGATIVE NEGATIVE Final    Comment: (NOTE) Fact Sheet for Patients: bloggercourse.com  Fact Sheet for Healthcare Providers: seriousbroker.it  This test is not yet approved or cleared by the [place]  FDA and has been authorized for detection and/or diagnosis of SARS-CoV-2 by FDA under an Emergency Use Authorization (EUA). This EUA will remain in effect (meaning this test can be used) for the duration of the COVID-19 declaration under Section 564(b)(1) of the Act, 21 U.S.C. section 360bbb-3(b)(1), unless the  authorization is terminated or revoked.  Performed at [facility], [address] [place], [place] [address]   Group A Strep by PCR     Status: None   Collection Time: 10/08/24 11:55 AM   Specimen: Urine, Clean Catch; Sterile Swab  Result Value Ref Range Status   Group A Strep by PCR NOT DETECTED NOT DETECTED Final    Comment: Performed at [facility], [address], [place], [place] [address]    Time coordinating discharge: 45 minutes  Signed: Ceili Boshers  [facility] 10/11/2024, 11:54 AM   "

## 2024-10-11 NOTE — TOC Transition Note (Signed)
 Transition of Care [facility]) - Discharge Note   Patient Details  Name: Carolyn Zimmerman MRN: [id number] Date of Birth: [birth year]  Transition of [facility]) CM/SW Contact:  Tom-Johnson, Harvest Muskrat, RN Phone Number: 10/11/2024, 1:02 PM   Clinical Narrative:     Patient is scheduled for discharge today.  Readmission Risk Assessment done. Hospital f/u and discharge instructions on AVS. Prescriptions sent to [facility] pharmacy and patient will receive meds prior discharge. No ICM needs or recommendations noted. Mother, Grayce will transport at discharge.  No further ICM needs noted.       Final next level of care: Home/Self Care Barriers to Discharge: Barriers Resolved   Patient Goals and CMS Choice Patient states their goals for this hospitalization and ongoing recovery are:: To return home CMS Medicare.gov Compare Post Acute Care list provided to:: Patient Choice offered to / list presented to : NA      Discharge Placement                Patient to be transferred to facility by: Mother Name of family member notified: Sonya    Discharge Plan and Services Additional resources added to the After Visit Summary for   In-house Referral: Clinical Social Work              DME Arranged: N/A DME Agency: NA       HH Arranged: NA HH Agency: NA        Social Drivers of Health (SDOH) Interventions SDOH Screenings   Food Insecurity: No Food Insecurity (10/09/2024)  Housing: Low Risk (10/09/2024)  Transportation Needs: No Transportation Needs (10/09/2024)  Utilities: Not At Risk (10/09/2024)  Alcohol Screen: Low Risk (05/11/2024)  Depression (PHQ2-9): Low Risk (07/25/2024)  Financial Resource Strain: Medium Risk (05/11/2024)  Physical Activity: Insufficiently Active (05/11/2024)  Social Connections: Socially Isolated (10/09/2024)  Stress: Stress Concern Present (05/11/2024)  Tobacco Use: Medium Risk (10/08/2024)     Readmission Risk Interventions    10/11/2024    1:00 PM   Readmission Risk Prevention Plan  Transportation Screening Complete  PCP or Specialist Appt within 3-5 Days Complete  HRI or Home Care Consult Complete  Social Work Consult for Recovery Care Planning/Counseling Complete  Palliative Care Screening Not Applicable  Medication Review Oceanographer) Referral to Pharmacy

## 2024-10-11 NOTE — Progress Notes (Signed)
"  °  Progress Note Patient Name: Yazlynn Birkeland Date of Encounter: 10/11/2024 [facility] Cardiologist: Kardie Tobb, DO   Interval Summary   No events overnight. She denies any CP or dyspnea.  Vital Signs Vitals:   10/10/24 2058 10/11/24 0024 10/11/24 0550 10/11/24 0719  BP: (!) 166/77 (!) 148/81 (!) 169/83 (!) 141/81  Pulse:    (!) 110  Resp: 18 18 18 18   Temp: 99 F (37.2 C) 98.9 F (37.2 C) 98.2 F (36.8 C) 98.2 F (36.8 C)  TempSrc: Oral Oral Oral Oral  SpO2:  100%  100%  Weight:   125.8 kg   Height:        Intake/Output Summary (Last 24 hours) at 10/11/2024 1041 Last data filed at 10/10/2024 2145 Gross per 24 hour  Intake 1392.85 ml  Output --  Net 1392.85 ml      10/11/2024    5:50 AM 10/09/2024    5:00 AM 10/08/2024   10:43 AM  Last 3 Weights  Weight (lbs) 277 lb 4.8 oz 277 lb 8 oz 286 lb 7.8 oz  Weight (kg) 125.782 kg 125.873 kg 129.95 kg      Telemetry/ECG  NSR - Personally Reviewed  Physical Exam  GEN: No acute distress.   Neck: No JVD Cardiac: RRR, no murmurs, rubs, or gallops.  Respiratory: Clear to auscultation bilaterally. GI: Soft, nontender, non-distended  MS: No edema  Assessment & Plan  Ms Fales is a 44 yoF with Hx of uncontrolled HTN, CVA, HFmrEF, and IDDM who presented with acute diastolic heart failure and hyperglycemia. She was briefly on IV lasix  80 mg BID and subsequently underwent LHC/RHC on 1/23 and found to have OM1 CTO and LVEDP 15 mmHg.  #NSTEMI s/p OM1 CTO PCI #HFrecoveredEF (55-60% 1/26) #Uncontrolled HTN #CVA - Denies any CP today - BP remains elevated; will add amlo 10 mg  - D/c on lasix  40 mg daily - Follow with Lpa and will refer to lipid clinic given premature ASCVD - Has appt with Dr. Sheena in a couple of days - Cont ASA, brillanta, statin, and irbesartan  - We will sign off; feel free to call us  back if you have any questions  Signed, Joelle VEAR Ren Donley, MD   "

## 2024-10-12 ENCOUNTER — Other Ambulatory Visit: Payer: Self-pay | Admitting: Physician Assistant

## 2024-10-12 ENCOUNTER — Encounter (HOSPITAL_COMMUNITY): Payer: Self-pay | Admitting: Cardiology

## 2024-10-12 DIAGNOSIS — E7841 Elevated Lipoprotein(a): Secondary | ICD-10-CM

## 2024-10-12 DIAGNOSIS — I2510 Atherosclerotic heart disease of native coronary artery without angina pectoris: Secondary | ICD-10-CM

## 2024-10-12 NOTE — Progress Notes (Signed)
" ° °  Ordered Lipid clinic referral per Dr. Deneise.   Signed, Lorette CINDERELLA Kapur, PA-C 10/12/2024, 9:58 AM Pager: [phone]  "

## 2024-10-13 ENCOUNTER — Telehealth: Payer: Self-pay

## 2024-10-13 NOTE — Transitions of Care (Post Inpatient/ED Visit) (Signed)
" ° °  10/13/2024  Name: Carolyn Zimmerman MRN: [id number] DOB: [birth year]  Today's TOC FU Call Status: Today's TOC FU Call Status:: Unsuccessful Call (1st Attempt) Unsuccessful Call (1st Attempt) Date: 10/13/24  Attempted to reach the patient regarding the most recent Inpatient/ED visit.  Follow Up Plan: Additional outreach attempts will be made to reach the patient to complete the Transitions of Care (Post Inpatient/ED visit) call.   Shona Prow RN, CCM [place]  VBCI-Population Health RN Care Manager [phone]  "

## 2024-10-14 ENCOUNTER — Telehealth: Payer: Self-pay

## 2024-10-14 ENCOUNTER — Telehealth (HOSPITAL_COMMUNITY): Payer: Self-pay

## 2024-10-14 LAB — LIPOPROTEIN A (LPA): Lipoprotein (a): 239.7 nmol/L — ABNORMAL HIGH

## 2024-10-14 NOTE — Telephone Encounter (Signed)
 Called patient to see if she was interested in Cardiac Rehab. No answer. LVMTCB

## 2024-10-14 NOTE — Transitions of Care (Post Inpatient/ED Visit) (Signed)
" ° °  10/14/2024  Name: Carolyn Zimmerman MRN: [id number] DOB: 1986/08/01  Today's TOC FU Call Status: Today's TOC FU Call Status:: Unsuccessful Call (2nd Attempt) Unsuccessful Call (2nd Attempt) Date: 10/14/24  Attempted to reach the patient regarding the most recent Inpatient/ED visit.  Follow Up Plan: Additional outreach attempts will be made to reach the patient to complete the Transitions of Care (Post Inpatient/ED visit) call.   Shona Prow RN, CCM [place]  VBCI-Population Health RN Care Manager [phone]  "

## 2024-10-15 ENCOUNTER — Telehealth: Payer: Self-pay

## 2024-10-15 NOTE — Transitions of Care (Post Inpatient/ED Visit) (Signed)
" ° °  10/15/2024  Name: Carolyn Zimmerman MRN: [id number] DOB: [birth year]  Today's TOC FU Call Status: Today's TOC FU Call Status:: Unsuccessful Call (3rd Attempt) Unsuccessful Call (3rd Attempt) Date: 10/15/24  Attempted to reach the patient regarding the most recent Inpatient/ED visit.  Follow Up Plan: No further outreach attempts will be made at this time. We have been unable to contact the patient.  Shona Prow RN, CCM [place]  VBCI-Population Health RN Care Manager [phone]  "

## 2024-10-21 ENCOUNTER — Other Ambulatory Visit (HOSPITAL_COMMUNITY): Payer: Self-pay

## 2024-10-21 ENCOUNTER — Encounter: Payer: Self-pay | Admitting: Cardiology

## 2024-10-21 ENCOUNTER — Ambulatory Visit: Payer: Self-pay | Admitting: Cardiology

## 2024-10-21 ENCOUNTER — Other Ambulatory Visit: Payer: Self-pay

## 2024-10-21 ENCOUNTER — Telehealth: Payer: Self-pay

## 2024-10-21 VITALS — BP 150/102 | HR 82 | Ht 67.0 in | Wt 285.6 lb

## 2024-10-21 DIAGNOSIS — E119 Type 2 diabetes mellitus without complications: Secondary | ICD-10-CM

## 2024-10-21 DIAGNOSIS — Z8673 Personal history of transient ischemic attack (TIA), and cerebral infarction without residual deficits: Secondary | ICD-10-CM | POA: Diagnosis not present

## 2024-10-21 DIAGNOSIS — I2510 Atherosclerotic heart disease of native coronary artery without angina pectoris: Secondary | ICD-10-CM

## 2024-10-21 DIAGNOSIS — Z79899 Other long term (current) drug therapy: Secondary | ICD-10-CM | POA: Diagnosis not present

## 2024-10-21 DIAGNOSIS — M25531 Pain in right wrist: Secondary | ICD-10-CM

## 2024-10-21 DIAGNOSIS — I5032 Chronic diastolic (congestive) heart failure: Secondary | ICD-10-CM

## 2024-10-21 DIAGNOSIS — Z59869 Financial insecurity, unspecified: Secondary | ICD-10-CM | POA: Diagnosis not present

## 2024-10-21 DIAGNOSIS — Z794 Long term (current) use of insulin: Secondary | ICD-10-CM

## 2024-10-21 LAB — COMPREHENSIVE METABOLIC PANEL WITH GFR
ALT: 15 [IU]/L (ref 0–32)
AST: 12 [IU]/L (ref 0–40)
Albumin: 3.6 g/dL — ABNORMAL LOW (ref 3.9–4.9)
Alkaline Phosphatase: 109 [IU]/L (ref 41–116)
BUN/Creatinine Ratio: 16 (ref 9–23)
BUN: 16 mg/dL (ref 6–20)
Bilirubin Total: 0.5 mg/dL (ref 0.0–1.2)
CO2: 23 mmol/L (ref 20–29)
Calcium: 9.8 mg/dL (ref 8.7–10.2)
Chloride: 99 mmol/L (ref 96–106)
Creatinine, Ser: 0.99 mg/dL (ref 0.57–1.00)
Globulin, Total: 3 g/dL (ref 1.5–4.5)
Glucose: 132 mg/dL — ABNORMAL HIGH (ref 70–99)
Potassium: 4.1 mmol/L (ref 3.5–5.2)
Sodium: 138 mmol/L (ref 134–144)
Total Protein: 6.6 g/dL (ref 6.0–8.5)
eGFR: 76 mL/min/{1.73_m2}

## 2024-10-21 LAB — MAGNESIUM: Magnesium: 1.6 mg/dL (ref 1.6–2.3)

## 2024-10-21 MED ORDER — AMLODIPINE BESYLATE 10 MG PO TABS
10.0000 mg | ORAL_TABLET | Freq: Every day | ORAL | 3 refills | Status: AC
  Filled 2024-10-21: qty 90, 90d supply, fill #0

## 2024-10-21 MED ORDER — DEXCOM G7 SENSOR MISC
1.0000 | Freq: Every day | 5 refills | Status: AC
  Filled 2024-10-21: qty 3, 30d supply, fill #0

## 2024-10-21 MED ORDER — TORSEMIDE 20 MG PO TABS
40.0000 mg | ORAL_TABLET | Freq: Every day | ORAL | 3 refills | Status: AC
  Filled 2024-10-21: qty 180, 90d supply, fill #0

## 2024-10-21 NOTE — Patient Instructions (Addendum)
 Medication Instructions:  Your physician has recommended you make the following change in your medication:  STOP: Lasix  START: Torsemide  40 mg once daily START: Amlodipine  10 mg once daily *If you need a refill on your cardiac medications before your next appointment, please call your pharmacy*  Lab Work: CMET, Mag If you have labs (blood work) drawn today and your tests are completely normal, you will receive your results only by: MyChart Message (if you have MyChart) OR A paper copy in the mail If you have any lab test that is abnormal or we need to change your treatment, we will call you to review the results.  Procedures/Testing: Your physician has requested that you have a upper extremity arterial duplex. This test is an ultrasound of the arteries in the arms. It looks at arterial blood flow in the arms. Allow one hour for Upper Arterial scans. There are no restrictions or special instructions.  Please note: We ask at that you not bring children with you during ultrasound (echo/ vascular) testing. Due to room size and safety concerns, children are not allowed in the ultrasound rooms during exams. Our front office staff cannot provide observation of children in our lobby area while testing is being conducted. An adult accompanying a patient to their appointment will only be allowed in the ultrasound room at the discretion of the ultrasound technician under special circumstances. We apologize for any inconvenience.   Follow-Up: At [facility], you and your health needs are our priority.  As part of our continuing mission to provide you with exceptional heart care, our providers are all part of one team.  This team includes your primary Cardiologist (physician) and Advanced Practice Providers or APPs (Physician Assistants and Nurse Practitioners) who all work together to provide you with the care you need, when you need it.  Your next appointment:   Feb 25th at 8am  Provider:    Kardie Tobb, DO    Other Instructions:

## 2024-10-21 NOTE — Telephone Encounter (Signed)
 Copied from CRM [id number]. Topic: Clinical - Medication Refill >> Oct 21, 2024 11:26 AM Debby BROCKS wrote: Medication: Continuous Glucose Sensor (DEXCOM G7 SENSOR) MISC  Has the patient contacted their pharmacy? Yes (Agent: If no, request that the patient contact the pharmacy for the refill. If patient does not wish to contact the pharmacy document the reason why and proceed with request.) (Agent: If yes, when and what did the pharmacy advise?)  Pharmacy states to contact PCP  This is the patient's preferred pharmacy:  [facility] [address], [address] [place] [address] Phone: [phone] Fax: [phone]  Is this the correct pharmacy for this prescription? Yes If no, delete pharmacy and type the correct one.   Has the prescription been filled recently? No  Is the patient out of the medication? No, 3 days left  Has the patient been seen for an appointment in the last year OR does the patient have an upcoming appointment? Yes  Can we respond through MyChart? Yes  Agent: Please be advised that Rx refills may take up to 3 business days. We ask that you follow-up with your pharmacy.

## 2024-10-24 ENCOUNTER — Telehealth: Payer: Self-pay | Admitting: Licensed Clinical Social Worker

## 2024-10-24 NOTE — Telephone Encounter (Signed)
 H&V Care Navigation CSW Progress Note  Clinical Social Worker contacted patient by phone to f/u again on referral. Was able to reach pt and pt mother today at [phone]. Introduced self, role, reason for call. We discussed access to medications- pt understands that charge accounts are able to be used at [employer] and she is encouraged to put anything she can towards those accounts financially. She had an interview for her disability claim, she was told it may take 6 months to process but has faith it will go more quickly. Encouraged her to stay in contact with Social Security admin or any teams supporting her claim and cautioned that these days claims do tend to take awhile to process. Encouraged her to stay in touch with our teams as needed moving forward as needed.   Patient is participating in a Managed Medicaid Plan:  Yes  SDOH Screenings   Food Insecurity: No Food Insecurity (10/09/2024)  Housing: Low Risk (10/09/2024)  Transportation Needs: No Transportation Needs (10/09/2024)  Utilities: Not At Risk (10/09/2024)  Alcohol Screen: Low Risk (05/11/2024)  Depression (PHQ2-9): Low Risk (07/25/2024)  Financial Resource Strain: Medium Risk (10/24/2024)  Physical Activity: Insufficiently Active (05/11/2024)  Social Connections: Socially Isolated (10/09/2024)  Stress: Stress Concern Present (05/11/2024)  Tobacco Use: Medium Risk (10/21/2024)  Health Literacy: Adequate Health Literacy (10/24/2024)    Marit Lark, MSW, LCSW Clinical Social Worker [facility] Heart/Vascular Care Navigation  [phone]- work cell phone (preferred)

## 2024-10-24 NOTE — Telephone Encounter (Signed)
 H&V Care Navigation CSW Progress Note  Clinical Social Worker contacted patient by phone to f/u on questions regarding medication access/affordability. Left voicemail at [phone], other number listed appears to be mothers number. Pt returned call states she picking up one of her kids and then has a disability interview this afternoon at 12. Discussed that I would call her back around 1-2pm, also shared I had sent information about Medicaid medication assistance to her MyChart.   Patient is participating in a Managed Medicaid Plan:  Yes  SDOH Screenings   Food Insecurity: No Food Insecurity (10/09/2024)  Housing: Low Risk (10/09/2024)  Transportation Needs: No Transportation Needs (10/09/2024)  Utilities: Not At Risk (10/09/2024)  Alcohol Screen: Low Risk (05/11/2024)  Depression (PHQ2-9): Low Risk (07/25/2024)  Financial Resource Strain: Medium Risk (05/11/2024)  Physical Activity: Insufficiently Active (05/11/2024)  Social Connections: Socially Isolated (10/09/2024)  Stress: Stress Concern Present (05/11/2024)  Tobacco Use: Medium Risk (10/21/2024)    Marit Lark, MSW, LCSW Clinical Social Worker [facility] Heart/Vascular Care Navigation  [phone]- work cell phone (preferred)

## 2024-11-06 ENCOUNTER — Ambulatory Visit (HOSPITAL_COMMUNITY)

## 2024-11-12 ENCOUNTER — Ambulatory Visit: Admitting: Cardiology

## 2024-11-14 ENCOUNTER — Ambulatory Visit: Payer: Self-pay | Admitting: Family
